# Patient Record
Sex: Female | Born: 1945
Health system: Southern US, Community
[De-identification: ages and names within clinical notes are randomized; demographics above are authoritative.]

## PROBLEM LIST (undated history)

## (undated) DIAGNOSIS — K635 Polyp of colon: Secondary | ICD-10-CM

## (undated) DIAGNOSIS — F329 Major depressive disorder, single episode, unspecified: Secondary | ICD-10-CM

## (undated) DIAGNOSIS — R569 Unspecified convulsions: Secondary | ICD-10-CM

## (undated) DIAGNOSIS — K5792 Diverticulitis of intestine, part unspecified, without perforation or abscess without bleeding: Secondary | ICD-10-CM

## (undated) DIAGNOSIS — K219 Gastro-esophageal reflux disease without esophagitis: Secondary | ICD-10-CM

## (undated) DIAGNOSIS — M810 Age-related osteoporosis without current pathological fracture: Secondary | ICD-10-CM

## (undated) DIAGNOSIS — I639 Cerebral infarction, unspecified: Secondary | ICD-10-CM

## (undated) DIAGNOSIS — F32A Depression, unspecified: Secondary | ICD-10-CM

## (undated) DIAGNOSIS — I10 Essential (primary) hypertension: Secondary | ICD-10-CM

## (undated) DIAGNOSIS — C50919 Malignant neoplasm of unspecified site of unspecified female breast: Secondary | ICD-10-CM

## (undated) HISTORY — DX: Major depressive disorder, single episode, unspecified: F32.9

## (undated) HISTORY — DX: Polyp of colon: K63.5

## (undated) HISTORY — DX: Diverticulitis of intestine, part unspecified, without perforation or abscess without bleeding: K57.92

## (undated) HISTORY — DX: Unspecified convulsions: R56.9

## (undated) HISTORY — PX: TONSILLECTOMY: SUR1361

## (undated) HISTORY — DX: Age-related osteoporosis without current pathological fracture: M81.0

## (undated) HISTORY — PX: TUBAL LIGATION: SHX77

## (undated) HISTORY — DX: Cerebral infarction, unspecified: I63.9

## (undated) HISTORY — DX: Malignant neoplasm of unspecified site of unspecified female breast: C50.919

## (undated) HISTORY — PX: MOUTH SURGERY: SHX715

## (undated) HISTORY — DX: Depression, unspecified: F32.A

---

## 1997-12-26 ENCOUNTER — Other Ambulatory Visit: Admission: RE | Admit: 1997-12-26 | Discharge: 1997-12-26 | Payer: Self-pay | Admitting: Obstetrics and Gynecology

## 1999-09-27 ENCOUNTER — Encounter: Admission: RE | Admit: 1999-09-27 | Discharge: 1999-09-27 | Payer: Self-pay | Admitting: Obstetrics and Gynecology

## 1999-09-27 ENCOUNTER — Encounter: Payer: Self-pay | Admitting: Obstetrics and Gynecology

## 2001-03-01 ENCOUNTER — Encounter: Admission: RE | Admit: 2001-03-01 | Discharge: 2001-03-01 | Payer: Self-pay | Admitting: Obstetrics and Gynecology

## 2001-03-01 ENCOUNTER — Encounter: Payer: Self-pay | Admitting: Obstetrics and Gynecology

## 2001-05-16 ENCOUNTER — Other Ambulatory Visit: Admission: RE | Admit: 2001-05-16 | Discharge: 2001-05-16 | Payer: Self-pay | Admitting: Obstetrics and Gynecology

## 2002-03-14 ENCOUNTER — Encounter: Admission: RE | Admit: 2002-03-14 | Discharge: 2002-03-14 | Payer: Self-pay | Admitting: Obstetrics and Gynecology

## 2002-03-14 ENCOUNTER — Encounter: Payer: Self-pay | Admitting: Obstetrics and Gynecology

## 2003-03-18 ENCOUNTER — Ambulatory Visit (HOSPITAL_COMMUNITY): Admission: RE | Admit: 2003-03-18 | Discharge: 2003-03-18 | Payer: Self-pay | Admitting: Obstetrics and Gynecology

## 2003-03-18 ENCOUNTER — Encounter: Payer: Self-pay | Admitting: Obstetrics and Gynecology

## 2009-02-03 LAB — HM COLONOSCOPY

## 2012-02-14 LAB — HM DEXA SCAN

## 2012-04-06 LAB — HM MAMMOGRAPHY: HM Mammogram: NORMAL

## 2012-07-24 ENCOUNTER — Other Ambulatory Visit: Payer: Self-pay | Admitting: Family Medicine

## 2012-07-24 ENCOUNTER — Ambulatory Visit
Admission: RE | Admit: 2012-07-24 | Discharge: 2012-07-24 | Disposition: A | Discharge status: Home / Self Care | Payer: BC Managed Care – PPO | Source: Ambulatory Visit | Attending: Family Medicine | Admitting: Family Medicine

## 2012-07-24 DIAGNOSIS — M545 Low back pain: Secondary | ICD-10-CM

## 2012-12-26 ENCOUNTER — Telehealth: Payer: Self-pay | Admitting: Family Medicine

## 2012-12-26 MED ORDER — EPINEPHRINE 0.3 MG/0.3ML IJ DEVI: 0.3000 mg | Freq: Once | INTRAMUSCULAR | Status: DC

## 2012-12-26 NOTE — Telephone Encounter (Signed)
Rx Refilled  

## 2013-01-08 ENCOUNTER — Encounter: Payer: Self-pay | Admitting: Family Medicine

## 2013-01-08 ENCOUNTER — Ambulatory Visit (INDEPENDENT_AMBULATORY_CARE_PROVIDER_SITE_OTHER): Payer: Medicare Other | Admitting: Family Medicine

## 2013-01-08 VITALS — BP 146/72 | HR 76 | Temp 97.9°F | Resp 14 | Wt 153.0 lb

## 2013-01-08 DIAGNOSIS — R300 Dysuria: Secondary | ICD-10-CM

## 2013-01-08 DIAGNOSIS — N390 Urinary tract infection, site not specified: Secondary | ICD-10-CM

## 2013-01-08 DIAGNOSIS — I10 Essential (primary) hypertension: Secondary | ICD-10-CM

## 2013-01-08 LAB — URINALYSIS, MICROSCOPIC ONLY: Casts: NONE SEEN

## 2013-01-08 LAB — URINALYSIS, ROUTINE W REFLEX MICROSCOPIC
Glucose, UA: NEGATIVE mg/dL
Ketones, ur: NEGATIVE mg/dL
Protein, ur: NEGATIVE mg/dL
pH: 7 (ref 5.0–8.0)

## 2013-01-08 MED ORDER — CIPROFLOXACIN HCL 500 MG PO TABS: 500.0000 mg | ORAL_TABLET | Freq: Two times a day (BID) | ORAL | Status: DC

## 2013-01-08 NOTE — Progress Notes (Signed)
  Subjective:    Patient ID: Cynthia [name], female    DOB: 1946/09/10, 67 y.o.   MRN: [id number]  HPI  Reports 3 days of dysuria, frequency, urgency, and hesitancy. Denies any fevers. Urinalysis is significant for leukocyte esterase, 1120 white blood cells per high power field and some trace hematuria.  She denies back pain, nausea, vomiting. No past medical history on file. Current Outpatient Prescriptions on File Prior to Visit  Medication Sig Dispense Refill  . EPINEPHrine (EPIPEN) 0.3 mg/0.3 mL DEVI Inject 0.3 mLs (0.3 mg total) into the muscle once. Pt has been getting Auto-Inj 2-Pak  1 Device  3   No current facility-administered medications on file prior to visit.   Allergies  Allergen Reactions  . Penicillins Rash   History   Social History  . Marital Status: Married    Spouse Name: N/A    Number of Children: N/A  . Years of Education: N/A   Occupational History  . Not on file.   Social History Main Topics  . Smoking status: Never Smoker   . Smokeless tobacco: Not on file  . Alcohol Use: No     Comment: 1-2 beers/wine in evening  . Drug Use: No  . Sexually Active: Not on file   Other Topics Concern  . Not on file   Social History Narrative  . No narrative on file     Review of Systems    remainder of review of systems is negative Objective:   Physical Exam  Cardiovascular: Normal rate, regular rhythm and normal heart sounds.   Pulmonary/Chest: Effort normal and breath sounds normal. No respiratory distress. She has no wheezes.  Abdominal: Soft. Bowel sounds are normal.          Assessment & Plan:  Dysuria - Plan: Urinalysis, Routine w reflex microscopic  HTN (hypertension)  UTI (urinary tract infection)  Cipro 500 mg by mouth twice a day for 5 days. Does have a lengthy discussion about her blood pressure. A recommended less than 2000 mg per day of sodium. Recommend she check her blood pressure every day. Her blood pressures consistently  running higher than 140/90, would recommend medication. Patient expressed understanding.

## 2013-04-23 ENCOUNTER — Encounter: Payer: Self-pay | Admitting: Family Medicine

## 2013-05-16 ENCOUNTER — Encounter: Payer: Self-pay | Admitting: Family Medicine

## 2013-05-16 ENCOUNTER — Ambulatory Visit (INDEPENDENT_AMBULATORY_CARE_PROVIDER_SITE_OTHER): Payer: Medicare Other | Admitting: Family Medicine

## 2013-05-16 VITALS — BP 136/78 | HR 106 | Temp 99.1°F | Resp 16 | Wt 151.0 lb

## 2013-05-16 DIAGNOSIS — J019 Acute sinusitis, unspecified: Secondary | ICD-10-CM

## 2013-05-16 DIAGNOSIS — R509 Fever, unspecified: Secondary | ICD-10-CM

## 2013-05-16 MED ORDER — AZITHROMYCIN 250 MG PO TABS: ORAL_TABLET | ORAL | Status: DC

## 2013-05-16 NOTE — Progress Notes (Signed)
Subjective:    Patient ID: Cynthia [name], female    DOB: 09/02/1946, 67 y.o.   MRN: [id number]  HPI  Patient is a very pleasant 67 year old white female. She's been having some sinus drainage over the last week. She has a sore scratchy throat and a nonproductive cough. However recently she has had a fever as high as 101.  She also has diffuse myalgias, headache, and neck stiffness. 2 days ago she donated platelets and had an IV for over 90 minutes.  The area around the is not red hot or shadowing. There is no evidence of a DVT in her arm. She feels that she most likely has a sinus infection. However she has never felt this poorly after a platelet donation.  No past medical history on file. Current Outpatient Prescriptions on File Prior to Visit  Medication Sig Dispense Refill  . EPINEPHrine (EPIPEN) 0.3 mg/0.3 mL DEVI Inject 0.3 mLs (0.3 mg total) into the muscle once. Pt has been getting Auto-Inj 2-Pak  1 Device  3  . alendronate (FOSAMAX) 70 MG tablet Take 70 mg by mouth every 7 (seven) days. Take with a full glass of water on an empty stomach.      . cetirizine (ZYRTEC) 10 MG tablet Take 10 mg by mouth daily.       No current facility-administered medications on file prior to visit.   Allergies  Allergen Reactions  . Penicillins Rash   History   Social History  . Marital Status: Married    Spouse Name: N/A    Number of Children: N/A  . Years of Education: N/A   Occupational History  . Not on file.   Social History Main Topics  . Smoking status: Never Smoker   . Smokeless tobacco: Not on file  . Alcohol Use: No     Comment: 1-2 beers/wine in evening  . Drug Use: No  . Sexual Activity: Not on file   Other Topics Concern  . Not on file   Social History Narrative  . No narrative on file     Review of Systems  All other systems reviewed and are negative.       Objective:   Physical Exam  Vitals reviewed. HENT:  Right Ear: External ear normal.  Left Ear:  External ear normal.  Nose: Nose normal.  Mouth/Throat: Oropharynx is clear and moist. No oropharyngeal exudate.  Neck: Neck supple. No thyromegaly present.  Cardiovascular: Normal rate, regular rhythm and normal heart sounds.  Exam reveals no gallop and no friction rub.   No murmur heard. Pulmonary/Chest: Effort normal and breath sounds normal. No respiratory distress. She has no wheezes. She has no rales. She exhibits no tenderness.  Abdominal: Soft. Bowel sounds are normal. She exhibits no distension. There is no tenderness. There is no rebound.  Musculoskeletal: She exhibits no edema and no tenderness.  Lymphadenopathy:    She has no cervical adenopathy.  Neurological: She is alert.  Skin: Skin is warm. No rash noted. No erythema. No pallor.          Assessment & Plan:  1. Acute sinusitis The patient most likely has acute sinusitis. Begin by taking a Z-Pak. She can take ibuprofen for headache and fever. Push fluids and rest. - azithromycin (ZITHROMAX) 250 MG tablet; 2 tabs poqday 1, 1 tab poqday 2-5  Dispense: 6 tablet; Refill: 0  2. Fever, unspecified Due to the recent invasive procedurewhere she had an IV for over 90 minutes, I would check blood  cultures x2 to rule out bacteremia. - Culture, blood (single) - Culture, blood (single)

## 2013-06-14 ENCOUNTER — Encounter: Payer: Self-pay | Admitting: Family Medicine

## 2013-06-14 ENCOUNTER — Other Ambulatory Visit: Payer: Medicare Other

## 2013-06-14 DIAGNOSIS — Z79899 Other long term (current) drug therapy: Secondary | ICD-10-CM

## 2013-06-14 DIAGNOSIS — K635 Polyp of colon: Secondary | ICD-10-CM | POA: Insufficient documentation

## 2013-06-14 DIAGNOSIS — Z0000 Encounter for general adult medical examination without abnormal findings: Secondary | ICD-10-CM

## 2013-06-14 DIAGNOSIS — M810 Age-related osteoporosis without current pathological fracture: Secondary | ICD-10-CM | POA: Insufficient documentation

## 2013-06-14 LAB — CBC WITH DIFFERENTIAL/PLATELET
Basophils Absolute: 0 10*3/uL (ref 0.0–0.1)
Basophils Relative: 0 % (ref 0–1)
Eosinophils Absolute: 0.1 10*3/uL (ref 0.0–0.7)
Eosinophils Relative: 1 % (ref 0–5)
HCT: 34.4 % — ABNORMAL LOW (ref 36.0–46.0)
Hemoglobin: 11.9 g/dL — ABNORMAL LOW (ref 12.0–15.0)
Lymphocytes Relative: 37 % (ref 12–46)
Lymphs Abs: 1.7 10*3/uL (ref 0.7–4.0)
MCH: 30.4 pg (ref 26.0–34.0)
MCHC: 34.6 g/dL (ref 30.0–36.0)
MCV: 87.8 fL (ref 78.0–100.0)
Monocytes Absolute: 0.4 10*3/uL (ref 0.1–1.0)
Monocytes Relative: 9 % (ref 3–12)
Neutro Abs: 2.4 10*3/uL (ref 1.7–7.7)
Neutrophils Relative %: 53 % (ref 43–77)
Platelets: 233 10*3/uL (ref 150–400)
RBC: 3.92 MIL/uL (ref 3.87–5.11)
RDW: 14 % (ref 11.5–15.5)
WBC: 4.7 10*3/uL (ref 4.0–10.5)

## 2013-06-14 LAB — COMPLETE METABOLIC PANEL WITH GFR
ALT: 15 U/L (ref 0–35)
AST: 23 U/L (ref 0–37)
Albumin: 3.9 g/dL (ref 3.5–5.2)
Alkaline Phosphatase: 58 U/L (ref 39–117)
BUN: 19 mg/dL (ref 6–23)
CO2: 27 mEq/L (ref 19–32)
Calcium: 8.8 mg/dL (ref 8.4–10.5)
Chloride: 107 mEq/L (ref 96–112)
Creat: 0.81 mg/dL (ref 0.50–1.10)
GFR, Est African American: 88 mL/min
GFR, Est Non African American: 76 mL/min
Glucose, Bld: 88 mg/dL (ref 70–99)
Potassium: 5 mEq/L (ref 3.5–5.3)
Sodium: 142 mEq/L (ref 135–145)
Total Bilirubin: 0.7 mg/dL (ref 0.3–1.2)
Total Protein: 5.8 g/dL — ABNORMAL LOW (ref 6.0–8.3)

## 2013-06-14 LAB — LIPID PANEL
Cholesterol: 188 mg/dL (ref 0–200)
HDL: 86 mg/dL (ref >39)
LDL Cholesterol: 90 mg/dL (ref 0–99)
Total CHOL/HDL Ratio: 2.2 Ratio
Triglycerides: 58 mg/dL (ref <150)
VLDL: 12 mg/dL (ref 0–40)

## 2013-06-14 LAB — TSH: TSH: 3.81 u[IU]/mL (ref 0.350–4.500)

## 2013-06-15 LAB — VITAMIN D 25 HYDROXY (VIT D DEFICIENCY, FRACTURES): Vit D, 25-Hydroxy: 48 ng/mL (ref 30–89)

## 2013-06-18 ENCOUNTER — Ambulatory Visit (INDEPENDENT_AMBULATORY_CARE_PROVIDER_SITE_OTHER): Payer: Medicare Other | Admitting: Family Medicine

## 2013-06-18 ENCOUNTER — Encounter: Payer: Self-pay | Admitting: Family Medicine

## 2013-06-18 VITALS — BP 140/70 | HR 80 | Temp 98.2°F | Resp 14 | Ht 66.5 in | Wt 153.0 lb

## 2013-06-18 DIAGNOSIS — Z0000 Encounter for general adult medical examination without abnormal findings: Secondary | ICD-10-CM

## 2013-06-18 DIAGNOSIS — Z23 Encounter for immunization: Secondary | ICD-10-CM

## 2013-06-18 DIAGNOSIS — J449 Chronic obstructive pulmonary disease, unspecified: Secondary | ICD-10-CM

## 2013-06-18 MED ORDER — HYDROCODONE-HOMATROPINE 5-1.5 MG/5ML PO SYRP: 5.0000 mL | ORAL_SOLUTION | Freq: Three times a day (TID) | ORAL | Status: DC | PRN

## 2013-06-18 NOTE — Progress Notes (Signed)
Subjective:    Patient ID: Cynthia [name], female    DOB: 12-27-1945, 67 y.o.   MRN: [id number]  HPI Patient is a very pleasant 67 year old white female who comes in today for general medical evaluation. She has no major concerns. She recently had bronchitis and still has a nagging cough. She is requesting a refill on Hycodan cough syrup which I'm glad to give her. Otherwise she is doing well. Her last colonoscopy was in 2010 pHs down after: Polyps and is due again next year. Her last bone density was in 2013. The patient has been on Fosamax for over 5 years. If we discontinue the medication this summer. She is next due for bone density in 2016. Her mammogram is up-to-date was performed this year. It was normal. She has had her flu shot. She has had her single shot. She's had a pneumonia shot today. She is overdue for a Pap smear. She has a remote history of an abnormal Pap smear almost 20 years ago. He has not had an abnormal Pap smear since. She denies any new sexual partners. Otherwise she is doing very well and her most recent labwork as listed below: Abstract on 06/14/2013  Component Date Value Range Status  . HM Mammogram 04/06/2012 normal  [facility]   Final  . HM Pap smear 02/16/2010 normal   Final  . HM Colonoscopy 02/03/2009 Dr Buccini   polyps   Final  . HM Dexa Scan 02/14/2012 osteoporosis   [facility]   Final  Lab on 06/14/2013  Component Date Value Range Status  . WBC 06/14/2013 4.7  4.0 - 10.5 K/uL Final  . RBC 06/14/2013 3.92  3.87 - 5.11 MIL/uL Final  . Hemoglobin 06/14/2013 11.9* 12.0 - 15.0 g/dL Final  . HCT 40/98/1191 34.4* 36.0 - 46.0 % Final  . MCV 06/14/2013 87.8  78.0 - 100.0 fL Final  . MCH 06/14/2013 30.4  26.0 - 34.0 pg Final  . MCHC 06/14/2013 34.6  30.0 - 36.0 g/dL Final  . RDW 47/82/9562 14.0  11.5 - 15.5 % Final  . Platelets 06/14/2013 233  150 - 400 K/uL Final  . Neutrophils Relative % 06/14/2013 53  43 - 77 % Final  . Neutro Abs 06/14/2013 2.4  1.7 - 7.7 K/uL Final   . Lymphocytes Relative 06/14/2013 37  12 - 46 % Final  . Lymphs Abs 06/14/2013 1.7  0.7 - 4.0 K/uL Final  . Monocytes Relative 06/14/2013 9  3 - 12 % Final  . Monocytes Absolute 06/14/2013 0.4  0.1 - 1.0 K/uL Final  . Eosinophils Relative 06/14/2013 1  0 - 5 % Final  . Eosinophils Absolute 06/14/2013 0.1  0.0 - 0.7 K/uL Final  . Basophils Relative 06/14/2013 0  0 - 1 % Final  . Basophils Absolute 06/14/2013 0.0  0.0 - 0.1 K/uL Final  . Smear Review 06/14/2013 Criteria for review not met   Final  . Cholesterol 06/14/2013 188  0 - 200 mg/dL Final   Comment: ATP III Classification:                                < 200        mg/dL        Desirable                               200 - 239  mg/dL        Borderline High                               >= 240        mg/dL        High                             . Triglycerides 06/14/2013 58  <150 mg/dL Final  . HDL 16/06/9603 86  >39 mg/dL Final  . Total CHOL/HDL Ratio 06/14/2013 2.2   Final  . VLDL 06/14/2013 12  0 - 40 mg/dL Final  . LDL Cholesterol 06/14/2013 90  0 - 99 mg/dL Final   Comment:                            Total Cholesterol/HDL Ratio:CHD Risk                                                 Coronary Heart Disease Risk Table                                                                 Men       Women                                   1/2 Average Risk              3.4        3.3                                       Average Risk              5.0        4.4                                    2X Average Risk              9.6        7.1                                    3X Average Risk             23.4       11.0                          Use the calculated Patient Ratio above and the CHD Risk table                           to determine the patient's  CHD Risk.                          ATP III Classification (LDL):                                < 100        mg/dL         Optimal                               100 - 129      mg/dL         Near or Above Optimal                               130 - 159     mg/dL         Borderline High                               160 - 189     mg/dL         High                                > 190        mg/dL         Very High                             . TSH 06/14/2013 3.810  0.350 - 4.500 uIU/mL Final  . Vit D, 25-Hydroxy 06/14/2013 48  30 - 89 ng/mL Final   Comment: This assay accurately quantifies Vitamin D, which is the sum of the                          25-Hydroxy forms of Vitamin D2 and D3.  Studies have shown that the                          optimum concentration of 25-Hydroxy Vitamin D is 30 ng/mL or higher.                           Concentrations of Vitamin D between 20 and 29 ng/mL are considered to                          be insufficient and concentrations less than 20 ng/mL are considered                          to be deficient for Vitamin D.  . Sodium 06/14/2013 142  135 - 145 mEq/L Final  . Potassium 06/14/2013 5.0  3.5 - 5.3 mEq/L Final  . Chloride 06/14/2013 107  96 - 112 mEq/L Final  . CO2 06/14/2013 27  19 - 32 mEq/L Final  . Glucose, Bld 06/14/2013 88  70 - 99 mg/dL Final  . BUN 29/56/2130 19  6 - 23 mg/dL Final  . Creat 86/57/8469 0.81  0.50 - 1.10 mg/dL Final  .  Total Bilirubin 06/14/2013 0.7  0.3 - 1.2 mg/dL Final  . Alkaline Phosphatase 06/14/2013 58  39 - 117 U/L Final  . AST 06/14/2013 23  0 - 37 U/L Final  . ALT 06/14/2013 15  0 - 35 U/L Final  . Total Protein 06/14/2013 5.8* 6.0 - 8.3 g/dL Final  . Albumin 16/06/9603 3.9  3.5 - 5.2 g/dL Final  . Calcium 54/05/8118 8.8  8.4 - 10.5 mg/dL Final  . GFR, Est African American 06/14/2013 88   Final  . GFR, Est Non African American 06/14/2013 76   Final   Comment:                            The estimated GFR is a calculation valid for adults (>=31 years old)                          that uses the CKD-EPI algorithm to adjust for age and sex. It is                            not to be used for  children, pregnant women, hospitalized patients,                             patients on dialysis, or with rapidly changing kidney function.                          According to the NKDEP, eGFR >89 is normal, 60-89 shows mild                          impairment, 30-59 shows moderate impairment, 15-29 shows severe                          impairment and <15 is ESRD.                              Past Medical History  Diagnosis Date  . Colon polyps   . Osteoporosis   . Depression     history of   History reviewed. No pertinent past surgical history. Current Outpatient Prescriptions on File Prior to Visit  Medication Sig Dispense Refill  . cetirizine (ZYRTEC) 10 MG tablet Take 10 mg by mouth daily.      Marland Kitchen EPINEPHrine (EPIPEN) 0.3 mg/0.3 mL DEVI Inject 0.3 mLs (0.3 mg total) into the muscle once. Pt has been getting Auto-Inj 2-Pak  1 Device  3   No current facility-administered medications on file prior to visit.   Allergies  Allergen Reactions  . Other Anaphylaxis    FIRE ANTS  . Penicillins Rash   History   Social History  . Marital Status: Married    Spouse Name: N/A    Number of Children: N/A  . Years of Education: N/A   Occupational History  . Not on file.   Social History Main Topics  . Smoking status: Never Smoker   . Smokeless tobacco: Never Used  . Alcohol Use: No     Comment: 1-2 beers/wine in evening  . Drug Use: No  . Sexual Activity: Not on file     Comment: Married since 2005  Other Topics Concern  . Not on file   Social History Narrative  . No narrative on file   Family History  Problem Relation Age of Onset  . Heart disease Mother   . Cancer Mother     lung      Review of Systems  All other systems reviewed and are negative.       Objective:   Physical Exam  Vitals reviewed. Constitutional: She is oriented to person, place, and time. She appears well-developed and well-nourished. No distress.  HENT:  Head: Normocephalic and  atraumatic.  Right Ear: External ear normal.  Left Ear: External ear normal.  Nose: Nose normal.  Mouth/Throat: Oropharynx is clear and moist. No oropharyngeal exudate.  Eyes: Conjunctivae and EOM are normal. Pupils are equal, round, and reactive to light. Right eye exhibits no discharge. Left eye exhibits no discharge. No scleral icterus.  Neck: Normal range of motion. Neck supple. No JVD present. No thyromegaly present.  Cardiovascular: Normal rate, regular rhythm, normal heart sounds and intact distal pulses.  Exam reveals no gallop and no friction rub.   No murmur heard. Pulmonary/Chest: Effort normal and breath sounds normal. No respiratory distress. She has no wheezes. She has no rales. She exhibits no tenderness.  Abdominal: Soft. Bowel sounds are normal. She exhibits no distension. There is no tenderness. There is no rebound and no guarding.  Musculoskeletal: Normal range of motion. She exhibits no edema and no tenderness.  Lymphadenopathy:    She has no cervical adenopathy.  Neurological: She is alert and oriented to person, place, and time. She has normal reflexes. She displays normal reflexes. No cranial nerve deficit. She exhibits normal muscle tone. Coordination normal.  Skin: Skin is warm. No rash noted. She is not diaphoretic. No erythema. No pallor.  Psychiatric: She has a normal mood and affect. Her behavior is normal. Judgment and thought content normal.          Assessment & Plan:  1. Obstructive chronic bronchitis without exacerbation - HYDROcodone-homatropine (HYCODAN) 5-1.5 MG/5ML syrup; Take 5 mLs by mouth every 8 (eight) hours as needed for cough.  Dispense: 120 mL; Refill: 0  2. Routine general medical examination at a health care facility Physical exam is completely normal. Lab work is excellent. Preventive care is up to date. I recommended a bone density test in 2 years. I recommended a colonoscopy and a mammogram next year. I will give the patient her pneumonia  vaccine today. Otherwise followup in one year or as needed.

## 2013-06-18 NOTE — Addendum Note (Signed)
Addended by: Legrand Rams B on: 06/18/2013 04:52 PM   Modules accepted: Orders

## 2013-12-18 ENCOUNTER — Other Ambulatory Visit: Payer: Self-pay | Admitting: Family Medicine

## 2014-05-06 ENCOUNTER — Telehealth: Payer: Self-pay | Admitting: Family Medicine

## 2014-05-06 NOTE — Telephone Encounter (Signed)
Patient wants referral for 3 d mammogram and would like to talk to you about this, she has an authorization number and pin to give you? I was a little confused please call her back at [phone]

## 2014-05-07 NOTE — Telephone Encounter (Signed)
Pt wants to know if you think she should have the 3D mammo, says it is authorized by her insurance co if ok, has appt tomorrow(05/08/14) at 930am.for normal mammogram. Says it is ok if not

## 2014-05-08 NOTE — Telephone Encounter (Signed)
I am fine either way, but regular mammograms are currently the standard recommendation.  #D would be more accurate but insurance may not fully cover.

## 2014-05-13 LAB — HM MAMMOGRAPHY: HM Mammogram: NORMAL

## 2014-05-13 LAB — HM DEXA SCAN: HM Dexa Scan: NORMAL

## 2014-06-17 ENCOUNTER — Encounter: Payer: Self-pay | Admitting: Family Medicine

## 2014-06-17 ENCOUNTER — Ambulatory Visit (INDEPENDENT_AMBULATORY_CARE_PROVIDER_SITE_OTHER): Payer: Medicare HMO | Admitting: Family Medicine

## 2014-06-17 VITALS — BP 130/74 | HR 72 | Temp 98.0°F | Resp 14 | Ht 65.0 in | Wt 150.0 lb

## 2014-06-17 DIAGNOSIS — Z23 Encounter for immunization: Secondary | ICD-10-CM

## 2014-06-17 DIAGNOSIS — Z124 Encounter for screening for malignant neoplasm of cervix: Secondary | ICD-10-CM

## 2014-06-17 DIAGNOSIS — Z1321 Encounter for screening for nutritional disorder: Secondary | ICD-10-CM

## 2014-06-17 DIAGNOSIS — M810 Age-related osteoporosis without current pathological fracture: Secondary | ICD-10-CM

## 2014-06-17 DIAGNOSIS — Z0000 Encounter for general adult medical examination without abnormal findings: Secondary | ICD-10-CM

## 2014-06-17 DIAGNOSIS — I10 Essential (primary) hypertension: Secondary | ICD-10-CM

## 2014-06-17 NOTE — Assessment & Plan Note (Signed)
Lifestyle controlled.

## 2014-06-17 NOTE — Progress Notes (Signed)
Patient ID: Cynthia [name], female   DOB: 02-09-1946, 68 y.o.   MRN: [id number] Subjective:   Patient presents for Medicare Annual/Subsequent preventive examination.  Patient here for annual exam and Pap smear. Her last Pap smear was about 3 years ago. She does have history of it abnormal Pap smear the 90s were checked for possibly done but this was benign. She's not had any vaginal bleeding since she went to menopause was at about 10 years ago. She's no specific concerns today. Review Past Medical/Family/Social: Per EMR   Risk Factors  Current exercise habits: walks Dietary issues discussed: None  Cardiac risk factors: diet controlled HTN  Depression Screen  (Note: if answer to either of the following is "Yes", a more complete depression screening is indicated)  Over the past two weeks, have you felt down, depressed or hopeless? No Over the past two weeks, have you felt little interest or pleasure in doing things? No Have you lost interest or pleasure in daily life? No Do you often feel hopeless? No Do you cry easily over simple problems? No   Activities of Daily Living  In your present state of health, do you have any difficulty performing the following activities?:  Driving? No  Managing money? No  Feeding yourself? No  Getting from bed to chair? No  Climbing a flight of stairs? No  Preparing food and eating?: No  Bathing or showering? No  Getting dressed: No  Getting to the toilet? No  Using the toilet:No  Moving around from place to place: No  In the past year have you fallen or had a near fall?:No  Are you sexually active? Yes  Do you have more than one partner? No   Hearing Difficulties: No  Do you often ask people to speak up or repeat themselves? No  Do you experience ringing or noises in your ears? No Do you have difficulty understanding soft or whispered voices? No  Do you feel that you have a problem with memory? No Do you often misplace items? No  Do you feel  safe at home? Yes  Cognitive Testing  Alert? Yes Normal Appearance?Yes  Oriented to person? Yes Place? Yes  Time? Yes  Recall of three objects? Yes  Can perform simple calculations? Yes  Displays appropriate judgment?Yes  Can read the correct time from a watch face?Yes   List the Names of Other Physician/Practitioners you currently use: Dr. Cristina Gong [facility]    Screening Tests / Date Colonoscopy      - 2015            Zostavax - UTD Mammogram - UTD Influenza Vaccine - Given Tetanus/tdap- UTD  ROS: GEN- denies fatigue, fever, weight loss,weakness, recent illness HEENT- denies eye drainage, change in vision, nasal discharge, CVS- denies chest pain, palpitations RESP- denies SOB, cough, wheeze ABD- denies N/V, change in stools, abd pain GU- denies dysuria, hematuria, dribbling, incontinence MSK- denies joint pain, muscle aches, injury Neuro- denies headache, dizziness, syncope, seizure activity  Physical: GEN- NAD, alert and oriented x3 HEENT- PERRL, EOMI, non injected sclera, pink conjunctiva, MMM, oropharynx clear Neck- Supple, no thryomegaly  CVS- RRR, no murmur RESP-CTAB Breast- normal symmetry, no nipple inversion,no nipple drainage, no nodules or lumps felt Nodes- no axillary nodes ABD-NABS,soft,NT,ND GU- normal external genitalia, vaginal mucosa pink and moist, cervix visualized no growth, no blood form os, nO discharge, no CMT, no ovarian masses, uterus normal size, bladder normal position Rectum- skin tag noted, normal rectal tone, FOBT neg EXT- No  edema Pulses- Radial, DP- 2+    Assessment:    Annual wellness medicare exam   Plan:    During the course of the visit the patient was educated and counseled about appropriate screening and preventive services including:   Flu shot and Prevnar 13 given Screen neg for depression.    Diet review for nutrition referral? Yes ____ Not Indicated __x__  Patient Instructions (the written plan) was given to the patient.   Medicare Attestation  I have personally reviewed:  The patient's medical and social history  Their use of alcohol, tobacco or illicit drugs  Their current medications and supplements  The patient's functional ability including ADLs,fall risks, home safety risks, cognitive, and hearing and visual impairment  Diet and physical activities  Evidence for depression or mood disorders  The patient's weight, height, BMI, and visual acuity have been recorded in the chart. I have made referrals, counseling, and provided education to the patient based on review of the above and I have provided the patient with a written personalized care plan for preventive services.

## 2014-06-17 NOTE — Assessment & Plan Note (Signed)
Bone Density to be done 

## 2014-06-17 NOTE — Patient Instructions (Signed)
Return for fasting labs Bone Density  F/U as needed

## 2014-06-18 LAB — PAP THINPREP ASCUS RFLX HPV RFLX TYPE

## 2014-06-18 MED ORDER — MELOXICAM 15 MG PO TABS: ORAL_TABLET | ORAL | Status: DC

## 2014-06-18 MED ORDER — EPINEPHRINE 0.3 MG/0.3ML IJ SOAJ: 0.3000 mg | Freq: Once | INTRAMUSCULAR | Status: DC

## 2014-06-18 NOTE — Addendum Note (Signed)
Addended by: Vic Blackbird F on: 06/18/2014 07:42 AM   Modules accepted: Orders, Medications

## 2014-06-20 ENCOUNTER — Encounter: Payer: Self-pay | Admitting: *Deleted

## 2014-06-27 ENCOUNTER — Other Ambulatory Visit: Payer: Medicare HMO

## 2014-06-27 DIAGNOSIS — I10 Essential (primary) hypertension: Secondary | ICD-10-CM

## 2014-06-27 DIAGNOSIS — Z1321 Encounter for screening for nutritional disorder: Secondary | ICD-10-CM

## 2014-06-27 LAB — COMPREHENSIVE METABOLIC PANEL
ALK PHOS: 64 U/L (ref 39–117)
ALT: 13 U/L (ref 0–35)
AST: 20 U/L (ref 0–37)
Albumin: 3.9 g/dL (ref 3.5–5.2)
BUN: 14 mg/dL (ref 6–23)
CO2: 27 mEq/L (ref 19–32)
Calcium: 9.2 mg/dL (ref 8.4–10.5)
Chloride: 107 mEq/L (ref 96–112)
Creat: 0.73 mg/dL (ref 0.50–1.10)
Glucose, Bld: 89 mg/dL (ref 70–99)
Potassium: 4.8 mEq/L (ref 3.5–5.3)
Sodium: 142 mEq/L (ref 135–145)
Total Bilirubin: 0.7 mg/dL (ref 0.2–1.2)
Total Protein: 5.8 g/dL — ABNORMAL LOW (ref 6.0–8.3)

## 2014-06-27 LAB — CBC WITH DIFFERENTIAL/PLATELET
BASOS ABS: 0 10*3/uL (ref 0.0–0.1)
BASOS PCT: 0 % (ref 0–1)
EOS PCT: 1 % (ref 0–5)
Eosinophils Absolute: 0.1 10*3/uL (ref 0.0–0.7)
HEMATOCRIT: 35.4 % — AB (ref 36.0–46.0)
Hemoglobin: 11.9 g/dL — ABNORMAL LOW (ref 12.0–15.0)
Lymphocytes Relative: 28 % (ref 12–46)
Lymphs Abs: 1.7 10*3/uL (ref 0.7–4.0)
MCH: 29.7 pg (ref 26.0–34.0)
MCHC: 33.6 g/dL (ref 30.0–36.0)
MCV: 88.3 fL (ref 78.0–100.0)
MONO ABS: 0.5 10*3/uL (ref 0.1–1.0)
Monocytes Relative: 8 % (ref 3–12)
Neutro Abs: 3.9 10*3/uL (ref 1.7–7.7)
Neutrophils Relative %: 63 % (ref 43–77)
Platelets: 258 10*3/uL (ref 150–400)
RBC: 4.01 MIL/uL (ref 3.87–5.11)
RDW: 13.6 % (ref 11.5–15.5)
WBC: 6.2 10*3/uL (ref 4.0–10.5)

## 2014-06-27 LAB — LIPID PANEL
CHOL/HDL RATIO: 2.1 ratio
Cholesterol: 181 mg/dL (ref 0–200)
HDL: 85 mg/dL (ref >39)
LDL Cholesterol: 85 mg/dL (ref 0–99)
TRIGLYCERIDES: 54 mg/dL (ref <150)
VLDL: 11 mg/dL (ref 0–40)

## 2014-06-28 LAB — VITAMIN D 25 HYDROXY (VIT D DEFICIENCY, FRACTURES): VIT D 25 HYDROXY: 46 ng/mL (ref 30–89)

## 2014-07-22 ENCOUNTER — Encounter: Payer: Self-pay | Admitting: *Deleted

## 2014-07-29 ENCOUNTER — Telehealth: Payer: Self-pay | Admitting: *Deleted

## 2014-07-29 NOTE — Telephone Encounter (Signed)
Pt has appointment at [facility] for Mammogram and Bone Density on Dec 7, Bone Density is at 3:00pm and follows with Mammogram at 3:30pm, pt is not to wear metal buttons or take Ca supplements day of her scans. LMTRC to pt.

## 2014-07-30 ENCOUNTER — Encounter: Payer: Self-pay | Admitting: Family Medicine

## 2014-07-30 NOTE — Telephone Encounter (Signed)
lmtrc

## 2014-07-31 ENCOUNTER — Encounter: Payer: Self-pay | Admitting: Family Medicine

## 2014-07-31 NOTE — Telephone Encounter (Signed)
Pt states already had Mammo and DEXA done this year, her Mammo was done in Sept 2015 but couldn't remember when she had her Done Density.

## 2014-10-13 ENCOUNTER — Other Ambulatory Visit: Payer: Self-pay | Admitting: Family Medicine

## 2014-10-14 NOTE — Telephone Encounter (Signed)
Medication refilled per protocol. 

## 2015-02-12 ENCOUNTER — Telehealth: Payer: Self-pay | Admitting: Family Medicine

## 2015-02-12 NOTE — Telephone Encounter (Signed)
Patient states has wasp bite and want to know if she should use her epinephrine - been several years since allergic reaction to wasp [phone]

## 2015-02-13 NOTE — Telephone Encounter (Signed)
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

## 2015-06-10 DIAGNOSIS — Z1231 Encounter for screening mammogram for malignant neoplasm of breast: Secondary | ICD-10-CM | POA: Diagnosis not present

## 2015-06-10 LAB — HM MAMMOGRAPHY: HM MAMMO: NEGATIVE

## 2015-06-18 ENCOUNTER — Encounter: Payer: Self-pay | Admitting: Family Medicine

## 2015-06-19 ENCOUNTER — Other Ambulatory Visit: Payer: Medicare HMO

## 2015-06-19 DIAGNOSIS — M810 Age-related osteoporosis without current pathological fracture: Secondary | ICD-10-CM | POA: Diagnosis not present

## 2015-06-19 DIAGNOSIS — Z0000 Encounter for general adult medical examination without abnormal findings: Secondary | ICD-10-CM | POA: Diagnosis not present

## 2015-06-19 DIAGNOSIS — Z79899 Other long term (current) drug therapy: Secondary | ICD-10-CM | POA: Diagnosis not present

## 2015-06-19 DIAGNOSIS — I10 Essential (primary) hypertension: Secondary | ICD-10-CM

## 2015-06-19 LAB — COMPLETE METABOLIC PANEL WITH GFR
ALT: 15 U/L (ref 6–29)
AST: 22 U/L (ref 10–35)
Albumin: 3.8 g/dL (ref 3.6–5.1)
Alkaline Phosphatase: 70 U/L (ref 33–130)
BILIRUBIN TOTAL: 0.6 mg/dL (ref 0.2–1.2)
BUN: 13 mg/dL (ref 7–25)
CHLORIDE: 106 mmol/L (ref 98–110)
CO2: 28 mmol/L (ref 20–31)
CREATININE: 0.69 mg/dL (ref 0.50–0.99)
Calcium: 8.5 mg/dL — ABNORMAL LOW (ref 8.6–10.4)
GFR, Est Non African American: >89 mL/min (ref >=60)
GLUCOSE: 87 mg/dL (ref 70–99)
Potassium: 4.4 mmol/L (ref 3.5–5.3)
Sodium: 144 mmol/L (ref 135–146)
Total Protein: 5.6 g/dL — ABNORMAL LOW (ref 6.1–8.1)

## 2015-06-19 LAB — CBC WITH DIFFERENTIAL/PLATELET
BASOS PCT: 0 % (ref 0–1)
Basophils Absolute: 0 10*3/uL (ref 0.0–0.1)
EOS ABS: 0.1 10*3/uL (ref 0.0–0.7)
EOS PCT: 2 % (ref 0–5)
HCT: 35.5 % — ABNORMAL LOW (ref 36.0–46.0)
Hemoglobin: 12 g/dL (ref 12.0–15.0)
LYMPHS ABS: 1.6 10*3/uL (ref 0.7–4.0)
Lymphocytes Relative: 31 % (ref 12–46)
MCH: 30.1 pg (ref 26.0–34.0)
MCHC: 33.8 g/dL (ref 30.0–36.0)
MCV: 89 fL (ref 78.0–100.0)
MONOS PCT: 10 % (ref 3–12)
MPV: 9.1 fL (ref 8.6–12.4)
Monocytes Absolute: 0.5 10*3/uL (ref 0.1–1.0)
Neutro Abs: 3 10*3/uL (ref 1.7–7.7)
Neutrophils Relative %: 57 % (ref 43–77)
Platelets: 310 10*3/uL (ref 150–400)
RBC: 3.99 MIL/uL (ref 3.87–5.11)
RDW: 13.6 % (ref 11.5–15.5)
WBC: 5.3 10*3/uL (ref 4.0–10.5)

## 2015-06-19 LAB — LIPID PANEL
CHOL/HDL RATIO: 1.7 ratio (ref <=5.0)
CHOLESTEROL: 173 mg/dL (ref 125–200)
HDL: 100 mg/dL (ref >=46)
LDL CALC: 65 mg/dL (ref <130)
TRIGLYCERIDES: 38 mg/dL (ref <150)
VLDL: 8 mg/dL (ref <30)

## 2015-06-20 LAB — TSH: TSH: 4.294 u[IU]/mL (ref 0.350–4.500)

## 2015-06-20 LAB — VITAMIN D 25 HYDROXY (VIT D DEFICIENCY, FRACTURES): Vit D, 25-Hydroxy: 35 ng/mL (ref 30–100)

## 2015-06-29 ENCOUNTER — Encounter: Payer: Self-pay | Admitting: Family Medicine

## 2015-06-29 ENCOUNTER — Other Ambulatory Visit: Payer: Self-pay | Admitting: Family Medicine

## 2015-06-29 ENCOUNTER — Ambulatory Visit (INDEPENDENT_AMBULATORY_CARE_PROVIDER_SITE_OTHER): Payer: Medicare HMO | Admitting: Family Medicine

## 2015-06-29 VITALS — BP 120/68 | HR 100 | Temp 99.0°F | Resp 16 | Ht 66.5 in | Wt 147.0 lb

## 2015-06-29 DIAGNOSIS — Z0000 Encounter for general adult medical examination without abnormal findings: Secondary | ICD-10-CM

## 2015-06-29 MED ORDER — ALPRAZOLAM 0.25 MG PO TABS: 0.2500 mg | ORAL_TABLET | Freq: Three times a day (TID) | ORAL | Status: DC | PRN

## 2015-06-29 NOTE — Progress Notes (Signed)
Subjective:    Patient ID: Cynthia [name], female    DOB: 12-02-1945, 69 y.o.   MRN: [id number]  HPI  patient is here today for a physical. Unfortunately her husband has started drinking heavily. He is an alcoholic. He will not admit that he has a problem. Her first husband died from cirrhosis due to alcohol. This is caused the patient a tremendous amount of stress and anxiety. She is having a difficult time sleeping at night. She denies clinical depression. She denies anhedonia. She denies suicidal ideation. However she  Finds herself lying in bed at night unable to sleep. She also reports occasional anxiety attacks which she will feel extremely anxious and tremulous. In the past she has used Xanax sparingly for anxiety.  She would like to try this again. Colonoscopy was performed last year and is not due again until 2020. Mammogram was just performed in September and was normal. Immunizations are up-to-date. She is already received her flu shot prior to my visit with her today. Her bone density was performed last year and is not due again until 2017. Her most recent lab work as listed below: Lab on 06/19/2015  Component Date Value Ref Range Status  . Sodium 06/19/2015 144  135 - 146 mmol/L Final  . Potassium 06/19/2015 4.4  3.5 - 5.3 mmol/L Final  . Chloride 06/19/2015 106  98 - 110 mmol/L Final  . CO2 06/19/2015 28  20 - 31 mmol/L Final  . Glucose, Bld 06/19/2015 87  70 - 99 mg/dL Final  . BUN 06/19/2015 13  7 - 25 mg/dL Final  . Creat 06/19/2015 0.69  0.50 - 0.99 mg/dL Final  . Total Bilirubin 06/19/2015 0.6  0.2 - 1.2 mg/dL Final  . Alkaline Phosphatase 06/19/2015 70  33 - 130 U/L Final  . AST 06/19/2015 22  10 - 35 U/L Final  . ALT 06/19/2015 15  6 - 29 U/L Final  . Total Protein 06/19/2015 5.6* 6.1 - 8.1 g/dL Final  . Albumin 06/19/2015 3.8  3.6 - 5.1 g/dL Final  . Calcium 06/19/2015 8.5* 8.6 - 10.4 mg/dL Final  . GFR, Est African American 06/19/2015 >89  >=60 mL/min Final  . GFR,  Est Non African American 06/19/2015 >89  >=60 mL/min Final   Comment:   The estimated GFR is a calculation valid for adults (>=55 years old) that uses the CKD-EPI algorithm to adjust for age and sex. It is   not to be used for children, pregnant women, hospitalized patients,    patients on dialysis, or with rapidly changing kidney function. According to the NKDEP, eGFR >89 is normal, 60-89 shows mild impairment, 30-59 shows moderate impairment, 15-29 shows severe impairment and <15 is ESRD.     Marland Kitchen TSH 06/19/2015 4.294  0.350 - 4.500 uIU/mL Final  . Cholesterol 06/19/2015 173  125 - 200 mg/dL Final  . Triglycerides 06/19/2015 38  <150 mg/dL Final  . HDL 06/19/2015 100  >=46 mg/dL Final  . Total CHOL/HDL Ratio 06/19/2015 1.7  <=5.0 Ratio Final  . VLDL 06/19/2015 8  <30 mg/dL Final  . LDL Cholesterol 06/19/2015 65  <130 mg/dL Final   Comment:   Total Cholesterol/HDL Ratio:CHD Risk                        Coronary Heart Disease Risk Table  Men       Women          1/2 Average Risk              3.4        3.3              Average Risk              5.0        4.4           2X Average Risk              9.6        7.1           3X Average Risk             23.4       11.0 Use the calculated Patient Ratio above and the CHD Risk table  to determine the patient's CHD Risk.   . WBC 06/19/2015 5.3  4.0 - 10.5 K/uL Final  . RBC 06/19/2015 3.99  3.87 - 5.11 MIL/uL Final  . Hemoglobin 06/19/2015 12.0  12.0 - 15.0 g/dL Final  . HCT 06/19/2015 35.5* 36.0 - 46.0 % Final  . MCV 06/19/2015 89.0  78.0 - 100.0 fL Final  . MCH 06/19/2015 30.1  26.0 - 34.0 pg Final  . MCHC 06/19/2015 33.8  30.0 - 36.0 g/dL Final  . RDW 06/19/2015 13.6  11.5 - 15.5 % Final  . Platelets 06/19/2015 310  150 - 400 K/uL Final  . MPV 06/19/2015 9.1  8.6 - 12.4 fL Final  . Neutrophils Relative % 06/19/2015 57  43 - 77 % Final  . Neutro Abs 06/19/2015 3.0  1.7 - 7.7 K/uL Final  .  Lymphocytes Relative 06/19/2015 31  12 - 46 % Final  . Lymphs Abs 06/19/2015 1.6  0.7 - 4.0 K/uL Final  . Monocytes Relative 06/19/2015 10  3 - 12 % Final  . Monocytes Absolute 06/19/2015 0.5  0.1 - 1.0 K/uL Final  . Eosinophils Relative 06/19/2015 2  0 - 5 % Final  . Eosinophils Absolute 06/19/2015 0.1  0.0 - 0.7 K/uL Final  . Basophils Relative 06/19/2015 0  0 - 1 % Final  . Basophils Absolute 06/19/2015 0.0  0.0 - 0.1 K/uL Final  . Smear Review 06/19/2015 Criteria for review not met   Final  . Vit D, 25-Hydroxy 06/19/2015 35  30 - 100 ng/mL Final   Comment: Vitamin D Status           25-OH Vitamin D        Deficiency                <20 ng/mL        Insufficiency         20 - 29 ng/mL        Optimal             > or = 30 ng/mL   For 25-OH Vitamin D testing on patients on D2-supplementation and patients for whom quantitation of D2 and D3 fractions is required, the QuestAssureD 25-OH VIT D, (D2,D3), LC/MS/MS is recommended: order code [phone] [phone] (patients > 2 yrs).   Abstract on 06/18/2015  Component Date Value Ref Range Status  . HM Mammogram 06/10/2015 negative   Final   [facility]    Past Medical History  Diagnosis Date  . Colon polyps   . Osteoporosis   . Depression  history of   No past surgical history on file. Current Outpatient Prescriptions on File Prior to Visit  Medication Sig Dispense Refill  . Calcium Carbonate-Vitamin D (OS-CAL 500 + D PO) Take by mouth.    . cetirizine (ZYRTEC) 10 MG tablet Take 10 mg by mouth daily.    Marland Kitchen EPINEPHrine 0.3 mg/0.3 mL IJ SOAJ injection Inject 0.3 mLs (0.3 mg total) into the muscle once. 1 Device 1  . ferrous sulfate 325 (65 FE) MG tablet Take 325 mg by mouth daily with breakfast. As needed    . meloxicam (MOBIC) 15 MG tablet TAKE 1 TABLET BY MOUTH EVERY DAY 30 tablet 2  . Multiple Vitamins-Minerals (B COMPLEX PLUS VITAMIN C PO) Take by mouth. As needed     No current facility-administered medications on file prior to visit.    Allergies  Allergen Reactions  . Other Anaphylaxis    FIRE ANTS  . Penicillins Rash   Social History   Social History  . Marital Status: Married    Spouse Name: N/A  . Number of Children: N/A  . Years of Education: N/A   Occupational History  . Not on file.   Social History Main Topics  . Smoking status: Never Smoker   . Smokeless tobacco: Never Used  . Alcohol Use: No     Comment: 1-2 beers/wine in evening  . Drug Use: No  . Sexual Activity: Not on file     Comment: Married since 2005   Other Topics Concern  . Not on file   Social History Narrative  . No narrative on file   Family History  Problem Relation Age of Onset  . Heart disease Mother   . Cancer Mother     lung      Review of Systems  All other systems reviewed and are negative.      Objective:   Physical Exam  Constitutional: She is oriented to person, place, and time. She appears well-developed and well-nourished. No distress.  HENT:  Head: Normocephalic and atraumatic.  Right Ear: External ear normal.  Left Ear: External ear normal.  Nose: Nose normal.  Mouth/Throat: Oropharynx is clear and moist. No oropharyngeal exudate.  Eyes: Conjunctivae and EOM are normal. Pupils are equal, round, and reactive to light. Right eye exhibits no discharge. Left eye exhibits no discharge. No scleral icterus.  Neck: Normal range of motion. Neck supple. No JVD present. No tracheal deviation present. No thyromegaly present.  Cardiovascular: Normal rate, regular rhythm, normal heart sounds and intact distal pulses.  Exam reveals no gallop and no friction rub.   No murmur heard. Pulmonary/Chest: Effort normal and breath sounds normal. No stridor. No respiratory distress. She has no wheezes. She has no rales. She exhibits no tenderness.  Abdominal: Soft. Bowel sounds are normal. She exhibits no distension and no mass. There is no tenderness. There is no rebound and no guarding.  Musculoskeletal: Normal range of  motion. She exhibits no edema or tenderness.  Lymphadenopathy:    She has no cervical adenopathy.  Neurological: She is alert and oriented to person, place, and time. She has normal reflexes. She displays normal reflexes. No cranial nerve deficit. She exhibits normal muscle tone. Coordination normal.  Skin: Skin is warm. No rash noted. She is not diaphoretic. No erythema. No pallor.  Psychiatric: She has a normal mood and affect. Her behavior is normal. Judgment and thought content normal.  Vitals reviewed.         Assessment & Plan:  Routine general medical examination at a health care facility Physical exam is normal.  Cancer screen is up to date.  Immunizations are up to date.  Regular anticipatory guidance is provided.  I will give xanax 0.25 mg poq8 hrs prn.  Hopefully 90 tabs will last several months. If not, add prozac 20 mg poday to help moderate the anxiety and avoid dependence on xanax.

## 2015-06-29 NOTE — Telephone Encounter (Signed)
Refill appropriate and filled per protocol. 

## 2015-07-08 ENCOUNTER — Other Ambulatory Visit: Payer: Self-pay | Admitting: Family Medicine

## 2015-07-08 NOTE — Telephone Encounter (Signed)
Refill appropriate and filled per protocol. 

## 2015-07-21 DIAGNOSIS — H40013 Open angle with borderline findings, low risk, bilateral: Secondary | ICD-10-CM | POA: Diagnosis not present

## 2015-07-28 DIAGNOSIS — H40013 Open angle with borderline findings, low risk, bilateral: Secondary | ICD-10-CM | POA: Diagnosis not present

## 2015-08-12 ENCOUNTER — Other Ambulatory Visit: Payer: Self-pay | Admitting: Family Medicine

## 2015-08-13 NOTE — Telephone Encounter (Signed)
Medication called to pharmacy. 

## 2015-08-13 NOTE — Telephone Encounter (Signed)
ok 

## 2015-08-13 NOTE — Telephone Encounter (Signed)
?   OK to Refill  

## 2015-09-24 ENCOUNTER — Telehealth: Payer: Self-pay | Admitting: Family Medicine

## 2015-09-24 NOTE — Telephone Encounter (Signed)
PATIENT CALLING TO SPEAK WITH YOU REGARDING HER UPCOMING APPOINTMENT  AND IF SHE SHOULD STILL COME IN OR NOT  [phone]

## 2015-09-24 NOTE — Telephone Encounter (Signed)
Pt calls and stated that she is doing fine on the Xanax and that it is lasting her for several months and would like to cancel appt and if she sees that she needs to increase it or add another medication she will schedule an appt.

## 2015-09-29 ENCOUNTER — Ambulatory Visit: Payer: Medicare HMO | Admitting: Family Medicine

## 2015-10-08 ENCOUNTER — Other Ambulatory Visit: Payer: Self-pay | Admitting: Family Medicine

## 2015-10-08 NOTE — Telephone Encounter (Signed)
ok 

## 2015-10-08 NOTE — Telephone Encounter (Signed)
Ok to refill??  Last office visit 06/29/2015.  Last refill 08/24/2015.

## 2015-10-08 NOTE — Telephone Encounter (Signed)
Medication called to pharmacy. 

## 2015-11-11 ENCOUNTER — Other Ambulatory Visit: Payer: Self-pay | Admitting: Family Medicine

## 2015-11-11 ENCOUNTER — Encounter: Payer: Self-pay | Admitting: Physician Assistant

## 2015-11-11 ENCOUNTER — Ambulatory Visit (INDEPENDENT_AMBULATORY_CARE_PROVIDER_SITE_OTHER): Payer: Medicare Other | Admitting: Physician Assistant

## 2015-11-11 VITALS — BP 136/82 | HR 84 | Temp 97.0°F | Resp 16 | Ht 66.5 in | Wt 150.0 lb

## 2015-11-11 DIAGNOSIS — J988 Other specified respiratory disorders: Secondary | ICD-10-CM

## 2015-11-11 DIAGNOSIS — B9689 Other specified bacterial agents as the cause of diseases classified elsewhere: Principal | ICD-10-CM

## 2015-11-11 MED ORDER — AZITHROMYCIN 250 MG PO TABS: ORAL_TABLET | ORAL | Status: DC

## 2015-11-11 NOTE — Progress Notes (Signed)
    Patient ID: Cynthia [name] MRN: [id number], DOB: 11/02/1945, 70 y.o. Date of Encounter: 11/11/2015, 6:53 PM    Chief Complaint:  Chief Complaint  Patient presents with  . Chest & head congestion, cough     HPI: 70 y.o. year old female reports she has been sick for 10 - 11 days now. Started with headache, then stuffiness, then drainage, then sore throat. Now a lot in chest. Using Mucinex. No known fever/chills.      Home Meds:   Outpatient Prescriptions Prior to Visit  Medication Sig Dispense Refill  . ALPRAZolam (XANAX) 0.25 MG tablet TAKE 1 TABLET THREE TIMES A DAY AS NEEDED FOR ANXIETY 90 tablet 0  . Calcium Carbonate-Vitamin D (OS-CAL 500 + D PO) Take by mouth.    . cetirizine (ZYRTEC) 10 MG tablet Take 10 mg by mouth daily.    Marland Kitchen EPIPEN 2-PAK 0.3 MG/0.3ML SOAJ injection INJECT 0.3 MLS (0.3 MG TOTAL) INTO THE MUSCLE ONCE. 2 Device 0  . ferrous sulfate 325 (65 FE) MG tablet Take 325 mg by mouth daily with breakfast. As needed    . meloxicam (MOBIC) 15 MG tablet TAKE 1 TABLET BY MOUTH EVERY DAY 30 tablet 2  . Multiple Vitamins-Minerals (B COMPLEX PLUS VITAMIN C PO) Take by mouth. As needed     No facility-administered medications prior to visit.    Allergies:  Allergies  Allergen Reactions  . Other Anaphylaxis    FIRE ANTS  . Penicillins Rash      Review of Systems: See HPI for pertinent ROS. All other ROS negative.    Physical Exam: Blood pressure 136/82, pulse 84, temperature 97 F (36.1 C), temperature source Oral, resp. rate 16, height 5' 6.5" (1.689 m), weight 150 lb (68.04 kg)., Body mass index is 23.85 kg/(m^2). General:  WNWD WF. Appears in no acute distress. HEENT: Normocephalic, atraumatic, eyes without discharge, sclera non-icteric, nares are without discharge. Bilateral auditory canals clear, TM's are without perforation, pearly grey and translucent with reflective cone of light bilaterally. Oral cavity moist, posterior pharynx without exudate,  erythema, peritonsillar abscess. No tenderness with percussion of frontal or maxillary sinuses.   Neck: Supple. No thyromegaly. No lymphadenopathy. Lungs: Clear bilaterally to auscultation without wheezes, rales, or rhonchi. Breathing is unlabored. Heart: Regular rhythm. No murmurs, rubs, or gallops. Msk:  Strength and tone normal for age. Extremities/Skin: Warm and dry.  Neuro: Alert and oriented X 3. Moves all extremities spontaneously. Gait is normal. CNII-XII grossly in tact. Psych:  Responds to questions appropriately with a normal affect.     ASSESSMENT AND PLAN:  70 y.o. year old female with  1. Bacterial respiratory infection Penicillin Allergy. She is to take antibiotic as directed. Cont Mucinex DM as expectorant. F/U if symptoms do not resolve wihtin one week of completion of abx. - azithromycin (ZITHROMAX) 250 MG tablet; Day 1: Take 2 daily.  Days 2-5: Take 1 daily.  Dispense: 6 tablet; Refill: 0   Signed, [address] [place], [place], [facility] 11/11/2015 6:53 PM

## 2015-11-11 NOTE — Telephone Encounter (Signed)
Ok to refill??  Last office visit 06/29/2015.  Last refill 10/08/2015.

## 2015-11-12 NOTE — Telephone Encounter (Signed)
Medication called to pharmacy. 

## 2015-11-12 NOTE — Telephone Encounter (Signed)
ok 

## 2016-02-04 ENCOUNTER — Other Ambulatory Visit: Payer: Self-pay | Admitting: Family Medicine

## 2016-02-04 NOTE — Telephone Encounter (Signed)
Ok to refill??  Last office visit 11/11/2015.  Last refill 11/12/2015.

## 2016-02-05 NOTE — Telephone Encounter (Signed)
rx called in

## 2016-02-05 NOTE — Telephone Encounter (Signed)
ok 

## 2016-03-20 ENCOUNTER — Inpatient Hospital Stay (HOSPITAL_COMMUNITY): Payer: Medicare Other

## 2016-03-20 ENCOUNTER — Inpatient Hospital Stay (HOSPITAL_COMMUNITY)
Admission: EM | Admit: 2016-03-20 | Discharge: 2016-03-24 | DRG: 064 | Disposition: A | Discharge status: Rehabilitation Facility | Payer: Medicare Other | Attending: Neurology | Admitting: Neurology

## 2016-03-20 ENCOUNTER — Emergency Department (HOSPITAL_COMMUNITY): Payer: Medicare Other

## 2016-03-20 ENCOUNTER — Encounter (HOSPITAL_COMMUNITY): Payer: Self-pay | Admitting: Emergency Medicine

## 2016-03-20 DIAGNOSIS — E871 Hypo-osmolality and hyponatremia: Secondary | ICD-10-CM | POA: Insufficient documentation

## 2016-03-20 DIAGNOSIS — R414 Neurologic neglect syndrome: Secondary | ICD-10-CM | POA: Diagnosis not present

## 2016-03-20 DIAGNOSIS — R4701 Aphasia: Secondary | ICD-10-CM | POA: Diagnosis present

## 2016-03-20 DIAGNOSIS — I610 Nontraumatic intracerebral hemorrhage in hemisphere, subcortical: Secondary | ICD-10-CM | POA: Diagnosis not present

## 2016-03-20 DIAGNOSIS — I611 Nontraumatic intracerebral hemorrhage in hemisphere, cortical: Principal | ICD-10-CM | POA: Diagnosis present

## 2016-03-20 DIAGNOSIS — I615 Nontraumatic intracerebral hemorrhage, intraventricular: Secondary | ICD-10-CM | POA: Diagnosis not present

## 2016-03-20 DIAGNOSIS — D62 Acute posthemorrhagic anemia: Secondary | ICD-10-CM | POA: Insufficient documentation

## 2016-03-20 DIAGNOSIS — G936 Cerebral edema: Secondary | ICD-10-CM | POA: Diagnosis present

## 2016-03-20 DIAGNOSIS — S069X9A Unspecified intracranial injury with loss of consciousness of unspecified duration, initial encounter: Secondary | ICD-10-CM | POA: Insufficient documentation

## 2016-03-20 DIAGNOSIS — M810 Age-related osteoporosis without current pathological fracture: Secondary | ICD-10-CM | POA: Diagnosis present

## 2016-03-20 DIAGNOSIS — R739 Hyperglycemia, unspecified: Secondary | ICD-10-CM | POA: Diagnosis present

## 2016-03-20 DIAGNOSIS — G8194 Hemiplegia, unspecified affecting left nondominant side: Secondary | ICD-10-CM | POA: Diagnosis not present

## 2016-03-20 DIAGNOSIS — I10 Essential (primary) hypertension: Secondary | ICD-10-CM | POA: Diagnosis present

## 2016-03-20 DIAGNOSIS — S06349D Traumatic hemorrhage of right cerebrum with loss of consciousness of unspecified duration, subsequent encounter: Secondary | ICD-10-CM | POA: Diagnosis not present

## 2016-03-20 DIAGNOSIS — G441 Vascular headache, not elsewhere classified: Secondary | ICD-10-CM | POA: Insufficient documentation

## 2016-03-20 DIAGNOSIS — S069XAA Unspecified intracranial injury with loss of consciousness status unknown, initial encounter: Secondary | ICD-10-CM | POA: Insufficient documentation

## 2016-03-20 DIAGNOSIS — R4781 Slurred speech: Secondary | ICD-10-CM | POA: Diagnosis present

## 2016-03-20 DIAGNOSIS — R41844 Frontal lobe and executive function deficit: Secondary | ICD-10-CM | POA: Diagnosis not present

## 2016-03-20 DIAGNOSIS — I619 Nontraumatic intracerebral hemorrhage, unspecified: Secondary | ICD-10-CM | POA: Diagnosis present

## 2016-03-20 LAB — URINALYSIS, ROUTINE W REFLEX MICROSCOPIC
Bilirubin Urine: NEGATIVE
Glucose, UA: NEGATIVE mg/dL
Hgb urine dipstick: NEGATIVE
Ketones, ur: NEGATIVE mg/dL
LEUKOCYTES UA: NEGATIVE
NITRITE: NEGATIVE
PH: 7.5 (ref 5.0–8.0)
Protein, ur: NEGATIVE mg/dL
SPECIFIC GRAVITY, URINE: 1.009 (ref 1.005–1.030)

## 2016-03-20 LAB — ETHANOL: Alcohol, Ethyl (B): <5 mg/dL (ref <5)

## 2016-03-20 LAB — COMPREHENSIVE METABOLIC PANEL
ALBUMIN: 4 g/dL (ref 3.5–5.0)
ALK PHOS: 80 U/L (ref 38–126)
ALT: 15 U/L (ref 14–54)
AST: 26 U/L (ref 15–41)
Anion gap: 9 (ref 5–15)
BILIRUBIN TOTAL: 0.8 mg/dL (ref 0.3–1.2)
BUN: 11 mg/dL (ref 6–20)
CALCIUM: 9.5 mg/dL (ref 8.9–10.3)
CO2: 24 mmol/L (ref 22–32)
Chloride: 103 mmol/L (ref 101–111)
Creatinine, Ser: 0.69 mg/dL (ref 0.44–1.00)
GFR calc Af Amer: >60 mL/min (ref >60)
GFR calc non Af Amer: >60 mL/min (ref >60)
Glucose, Bld: 139 mg/dL — ABNORMAL HIGH (ref 65–99)
POTASSIUM: 3.8 mmol/L (ref 3.5–5.1)
SODIUM: 136 mmol/L (ref 135–145)
TOTAL PROTEIN: 6.6 g/dL (ref 6.5–8.1)

## 2016-03-20 LAB — I-STAT CHEM 8, ED
BUN: 12 mg/dL (ref 6–20)
CHLORIDE: 102 mmol/L (ref 101–111)
CREATININE: 0.7 mg/dL (ref 0.44–1.00)
Calcium, Ion: 1.1 mmol/L — ABNORMAL LOW (ref 1.12–1.23)
Glucose, Bld: 139 mg/dL — ABNORMAL HIGH (ref 65–99)
HEMATOCRIT: 39 % (ref 36.0–46.0)
Hemoglobin: 13.3 g/dL (ref 12.0–15.0)
POTASSIUM: 3.8 mmol/L (ref 3.5–5.1)
Sodium: 137 mmol/L (ref 135–145)
TCO2: 24 mmol/L (ref 0–100)

## 2016-03-20 LAB — I-STAT TROPONIN, ED: Troponin i, poc: 0 ng/mL (ref 0.00–0.08)

## 2016-03-20 LAB — DIFFERENTIAL
BASOS ABS: 0 10*3/uL (ref 0.0–0.1)
Basophils Relative: 0 %
Eosinophils Absolute: 0 10*3/uL (ref 0.0–0.7)
Eosinophils Relative: 0 %
LYMPHS ABS: 1.4 10*3/uL (ref 0.7–4.0)
LYMPHS PCT: 18 %
Monocytes Absolute: 0.6 10*3/uL (ref 0.1–1.0)
Monocytes Relative: 8 %
NEUTROS PCT: 74 %
Neutro Abs: 5.7 10*3/uL (ref 1.7–7.7)

## 2016-03-20 LAB — GLUCOSE, CAPILLARY: Glucose-Capillary: 148 mg/dL — ABNORMAL HIGH (ref 65–99)

## 2016-03-20 LAB — CBC
HCT: 38.7 % (ref 36.0–46.0)
HEMOGLOBIN: 13 g/dL (ref 12.0–15.0)
MCH: 30 pg (ref 26.0–34.0)
MCHC: 33.6 g/dL (ref 30.0–36.0)
MCV: 89.4 fL (ref 78.0–100.0)
PLATELETS: 290 10*3/uL (ref 150–400)
RBC: 4.33 MIL/uL (ref 3.87–5.11)
RDW: 12.7 % (ref 11.5–15.5)
WBC: 7.8 10*3/uL (ref 4.0–10.5)

## 2016-03-20 LAB — APTT: APTT: 32 s (ref 24–37)

## 2016-03-20 LAB — MRSA PCR SCREENING: MRSA by PCR: NEGATIVE

## 2016-03-20 LAB — RAPID URINE DRUG SCREEN, HOSP PERFORMED
Amphetamines: NOT DETECTED
Barbiturates: NOT DETECTED
Benzodiazepines: NOT DETECTED
COCAINE: NOT DETECTED
OPIATES: NOT DETECTED
Tetrahydrocannabinol: NOT DETECTED

## 2016-03-20 LAB — PROTIME-INR
INR: 1.03 (ref 0.00–1.49)
Prothrombin Time: 13.7 seconds (ref 11.6–15.2)

## 2016-03-20 MED ORDER — NICARDIPINE HCL IN NACL 20-0.86 MG/200ML-% IV SOLN: 0.0000 mg/h | INTRAVENOUS | Status: DC

## 2016-03-20 MED ORDER — SODIUM CHLORIDE 0.9 % IV SOLN
INTRAVENOUS | Status: DC
  Administered 2016-03-20 – 2016-03-24 (×2): via INTRAVENOUS

## 2016-03-20 MED ORDER — MORPHINE SULFATE (PF) 2 MG/ML IV SOLN
1.0000 mg | INTRAVENOUS | Status: DC | PRN
  Administered 2016-03-20 – 2016-03-22 (×2): 2 mg via INTRAVENOUS
  Filled 2016-03-20 (×2): qty 1

## 2016-03-20 MED ORDER — LABETALOL HCL 5 MG/ML IV SOLN: 20.0000 mg | Freq: Once | INTRAVENOUS | Status: DC

## 2016-03-20 MED ORDER — ACETAMINOPHEN 650 MG RE SUPP: 650.0000 mg | RECTAL | Status: DC | PRN

## 2016-03-20 MED ORDER — ONDANSETRON HCL 4 MG/2ML IJ SOLN
4.0000 mg | INTRAMUSCULAR | Status: DC | PRN
  Administered 2016-03-21 – 2016-03-22 (×5): 4 mg via INTRAVENOUS
  Filled 2016-03-20 (×5): qty 2

## 2016-03-20 MED ORDER — ACETAMINOPHEN 325 MG PO TABS
650.0000 mg | ORAL_TABLET | ORAL | Status: DC | PRN
  Administered 2016-03-20 – 2016-03-23 (×5): 650 mg via ORAL
  Filled 2016-03-20 (×6): qty 2

## 2016-03-20 MED ORDER — SENNOSIDES-DOCUSATE SODIUM 8.6-50 MG PO TABS
1.0000 | ORAL_TABLET | Freq: Two times a day (BID) | ORAL | Status: DC
  Administered 2016-03-20 – 2016-03-24 (×7): 1 via ORAL
  Filled 2016-03-20 (×7): qty 1

## 2016-03-20 MED ORDER — FAMOTIDINE IN NACL 20-0.9 MG/50ML-% IV SOLN
20.0000 mg | Freq: Two times a day (BID) | INTRAVENOUS | Status: DC
  Administered 2016-03-21: 20 mg via INTRAVENOUS
  Filled 2016-03-20: qty 50

## 2016-03-20 MED ORDER — STROKE: EARLY STAGES OF RECOVERY BOOK
Freq: Once | Status: DC
  Filled 2016-03-20: qty 1

## 2016-03-20 MED ORDER — ALPRAZOLAM 0.25 MG PO TABS
0.2500 mg | ORAL_TABLET | Freq: Two times a day (BID) | ORAL | Status: DC | PRN
  Administered 2016-03-24: 0.25 mg via ORAL
  Filled 2016-03-20: qty 1

## 2016-03-20 NOTE — ED Notes (Signed)
MD ray at bedside.

## 2016-03-20 NOTE — ED Notes (Signed)
Pt to mri 

## 2016-03-20 NOTE — H&P (Signed)
Neurology Admission History and Physical  Requesting provider: Pattricia Boss, MD  CC: headache, trouble getting words out  HPI: This is a 70 year old right-handed woman who presents to the [facility] Emergency department after being sent from her doctor's office due to concern for possible stroke. History is obtained directly from the patient who is a good historian. She is accompanied by family members to offer additional information as needed.  The patient reports that last night she developed a sense of feeling disoriented and having difficulty speaking. When asked about her speech, she describes that she has trouble focusing her thoughts and getting her words out. She reports that her sister also noticed that she seemed slow to respond to questions. She feels as if her speech is somewhat slurred as well. She does not endorse any weakness, numbness, vision changes, difficulty swallowing, or balance impairments. However, she has noticed that she will find her left arm in unusual postures and positions without being aware that it is doing anything. When she looks at it, she is able to control it without a problem and again denies any weakness or sensory changes. She has also had a headache, though it sounds as if this may have been present for the past 2 days. She currently reports her headache is improved. She denies any similar previous episodes.  She denies any recent trauma to the head. She does note that she was working with her brother at her home and had been up on a ladder. She was getting off the ladder, she thought she was on the ground but instead was on the second step of the latter when she stumbled backwards. She says that she struck her bottom on a nearby chair, then fell to the floor landing on her bottom. She did not strike her head. She has not had any recent illness. She does not use daily antiplatelet therapy or anticoagulation. She uses occasional Advil for headache and pain and has  taken a total of six 200 mg tablets since last night. She also reports that she took a baby aspirin earlier today as recommended by her sister because of her symptoms.  PMH:  Past Medical History  Diagnosis Date  . Colon polyps   . Osteoporosis   . Depression     history of    PSH:  1. Tubal ligation 2. Tonsillectomy as a child, age 50  Family history: Mother had heart disease and what she describes as an incarcerated intestine. She had a maternal aunt who had a cerebral hemorrhage. Her paternal grandmother had strokes.   Social history:  She is widowed. She lives independently. 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 She denies any history of tobacco use apart from occasionally taking a drag off of her brothers cigarettes as a teenager. She smoked marijuana as a teenager, none since that time. She denies any other illicit drug use. She does drink one to 2 alcoholic beverages per night, either beer or wine. Eyes and history of heavy alcohol use.   Current outpatient meds: Current Meds  Medication Sig  . ALPRAZolam (XANAX) 0.25 MG tablet TAKE 1 TABLET 3 TIMES DAILY AS NEEDED FOR ANXIETY (Patient taking differently: TAKE 1 TABLET 2 TIMES DAILY AS NEEDED FOR ANXIETY)  . Calcium Carbonate-Vitamin D (OS-CAL 500 + D PO) Take 1 tablet by mouth daily.   . meloxicam (MOBIC)  15 MG tablet TAKE 1 TABLET BY MOUTH EVERY DAY (Patient taking differently: TAKE 1 TABLET (15 mg) BY MOUTH EVERY DAY as needed for lower back pain)    Current inpatient meds:  Current Facility-Administered Medications  Medication Dose Route Frequency Provider Last Rate Last Dose  .  stroke: mapping our early stages of recovery book   Does not apply Once Darrel Reach, MD      . acetaminophen (TYLENOL) tablet 650 mg  650 mg Oral Q4H PRN Darrel Reach, MD       Or  . acetaminophen  (TYLENOL) suppository 650 mg  650 mg Rectal Q4H PRN Darrel Reach, MD      . ALPRAZolam Duanne Moron) tablet 0.25 mg  0.25 mg Oral BID PRN Darrel Reach, MD      . famotidine (PEPCID) IVPB 20 mg premix  20 mg Intravenous Q12H Darrel Reach, MD      . labetalol (NORMODYNE,TRANDATE) injection 20 mg  20 mg Intravenous Once Darrel Reach, MD       And  . nicardipine (CARDENE) 20mg  in 0.86% saline 278ml IV infusion (0.1 mg/ml)  0-15 mg/hr Intravenous Continuous Darrel Reach, MD      . morphine 2 MG/ML injection 1-4 mg  1-4 mg Intravenous Q1H PRN Darrel Reach, MD      . senna-docusate (Senokot-S) tablet 1 tablet  1 tablet Oral BID Darrel Reach, MD       Current Outpatient Prescriptions  Medication Sig Dispense Refill  . ALPRAZolam (XANAX) 0.25 MG tablet TAKE 1 TABLET 3 TIMES DAILY AS NEEDED FOR ANXIETY (Patient taking differently: TAKE 1 TABLET 2 TIMES DAILY AS NEEDED FOR ANXIETY) 90 tablet 0  . Calcium Carbonate-Vitamin D (OS-CAL 500 + D PO) Take 1 tablet by mouth daily.     . meloxicam (MOBIC) 15 MG tablet TAKE 1 TABLET BY MOUTH EVERY DAY (Patient taking differently: TAKE 1 TABLET (15 mg) BY MOUTH EVERY DAY as needed for lower back pain) 30 tablet 2  . cetirizine (ZYRTEC) 10 MG tablet Take 10 mg by mouth daily as needed for allergies.     Marland Kitchen EPIPEN 2-PAK 0.3 MG/0.3ML SOAJ injection INJECT 0.3 MLS (0.3 MG TOTAL) INTO THE MUSCLE ONCE. 2 Device 0  . ferrous sulfate 325 (65 FE) MG tablet Take 325 mg by mouth daily as needed (to improve hemoglobin).       Allergies: Allergies  Allergen Reactions  . Other Anaphylaxis    FIRE ANTS  . Penicillins Rash    Has patient had a PCN reaction causing immediate rash, facial/tongue/throat swelling, SOB or lightheadedness with hypotension: Unknown Has patient had a PCN reaction causing severe rash involving mucus membranes or skin necrosis: No Has patient had a PCN reaction that required hospitalization No Has patient had a  PCN reaction occurring within the last 10 years: No If all of the above answers are "NO", then may proceed with Cephalosporin use.     ROS: As per HPI. A full 14-point review of systems was performed and is otherwise Unremarkable.  PE:  BP 148/85 mmHg  Pulse 76  Temp(Src) 97.6 F (36.4 C) (Oral)  Resp 16  Ht 5\' 6"  (1.676 m)  Wt 68.04 kg (150 lb)  BMI 24.22 kg/m2  SpO2 93%  General: WDWN Caucasian woman resting in the ER, no acute distress. AAO x4. Speech clear, no dysarthria. No aphasia. She has evident psychomotor slowing. It takes her 2-3 seconds to respond to questions.  She does answer appropriately, just slowly. Affect is restricted. Comportment is normal.  HEENT: Normocephalic. Neck supple without LAD. MMM, OP clear. Dentition good. Sclerae anicteric. No conjunctival injection.  CV: Regular, no murmur. Carotid pulses full and symmetric, no bruits. Distal pulses 2+ and symmetric.  Lungs: CTAB.  Abdomen: Soft, non-distended, non-tender. Bowel sounds present x4.  Extremities: No C/C/E. Neuro:  CN: Pupils are equal and round. They are symmetrically reactive from 3-->2 mm. EOMI without nystagmus. No reported diplopia. Facial sensation is intact to light touch. Face is symmetric at rest with normal strength an mobility. Hearing is intact to conversational voice. Palate elevates symmetrically and uvula is midline. Voice is normal in tone, pitch and quality. Bilateral SCM and trapezii are 5/5. Tongue is midline with normal bulk and mobility.  Motor: Normal bulk, tone, and strength. She appears to have motor perseveration with the left upper extremity. No overt neglect is appreciated No tremor or other abnormal movements. No drift.  Sensation: intact to light touch and pinprick. Vibration is diminished to the level of the ankle in both feet. She appears to have mild impairment in joint position as well, this is somewhat inconsistent.  DTRs: 2+ in the right upper extremity. Reflexes are 3+ in  the left upper extremity and both lower extremities with the exception of clonus at the right knee. She has bilateral crossed abductors, more pronounced on the left than the right. Toes are mute bilaterally. No Hoffman's.  Coordination: Finger-to-nose is mildly unsteady on the left, normal on the right. Heel-to-shin is without dysmetria. Finger taps are slower and more deliberate with the left hand.   Labs:  Lab Results  Component Value Date   WBC 7.8 03/20/2016   HGB 13.3 03/20/2016   HCT 39.0 03/20/2016   PLT 290 03/20/2016   GLUCOSE 139* 03/20/2016   CHOL 173 06/19/2015   TRIG 38 06/19/2015   HDL 100 06/19/2015   LDLCALC 65 06/19/2015   ALT 15 03/20/2016   AST 26 03/20/2016   NA 137 03/20/2016   K 3.8 03/20/2016   CL 102 03/20/2016   CREATININE 0.70 03/20/2016   BUN 12 03/20/2016   CO2 24 03/20/2016   TSH 4.294 06/19/2015   INR 1.03 03/20/2016   TUDS negative UA negative  PTT 32 EtOH <5  Imaging:  I have personally and independently reviewed the CT scan of the head without contrast from today's visit. This shows a large acute intraparenchymal hemorrhage involving the right frontal lobe. This measures 30 x 34 x 39 mm per radiology measurements. This gives an estimated hemorrhage volume of 20 mL. There is mild degree of surrounding vasogenic edema resulting in regional mass effect but no shift. There is no hydrocephalus. There does not appear to be any blood within the ventricles. She has a mild degree of scattered patchy hypodensities in the bihemispheric white matter suggestive of chronic small vessel ischemic change.   Assessment and Plan:  1. Intracerebral hemorrhage: She has a large right frontal intracerebral bleed. This is a lobar and thus most consistent with amyloid angiopathy, though hypertensive hemorrhage would also be possible. Less likely considerations would include underlying AVM or vasculitis. She has no known history of hypertension and states that her systolic  blood pressures typically run in the 130s. She is mildly hypertensive here in the emergency department with systolic pressures ranging from 145-163. She denies regular use of antiplatelet agents and anticoagulants and only uses Advil on occasion. She denies any head trauma. At this point,  should it be admitted to the neuro ICU for monitoring. Blood pressure will be watched closely with goal systolic blood pressure between 140 and 160. She has no evidence of coagulopathy. I will order MRI scan of the brain to further assess her hemorrhage as this is the most sensitive exam for amyloid angiopathy. I will initiate IV fluids with normal saline 75 mL per hour. Hypotonic fluid should be avoided as this can exacerbate cerebral edema in this setting. Hyperglycemia and fever should also be aggressively treated when present as these can worsen neurologic outcome. Antiplatelet agents, nonsteroidal anti-inflammatory drugs, and anticoagulants should be avoided at this time. If her hemorrhage remains stable on imaging for 24 hours, can consider heparin or low molecular weight heparin for DVT prophylaxis as needed. In the meantime she should have SCDs in place. I will make her NPO until swallow evaluation can be performed and she is cleared for oral intake.  2. Left upper extremity motor perseveration: She doesn't have any frank weakness or sensory loss in her left arm. However, she does demonstrate motor perseveration and perhaps some mild apraxia as well. This would be consistent with her right frontal hemorrhage. Recommend occupational therapy to evaluate and treat.  3. Cognitive impairment: She demonstrates evidence of slowed speed of processing which is consistent with her right frontal hemorrhage. I anticipate that this will improve in time. If it persists, consideration should be given to cognitive evaluation by speech therapy and cognitive rehabilitation as needed.  4. Headache: At this point, her headache is  improved. Ensure symptomatic pain control as needed.  This was discussed with the patient and her family members in the emergency department. They are in agreement with the plan as noted. They were given the opportunity to ask any questions and these were addressed to their satisfaction. I have also discussed the case with Dr. Jeanell Sparrow in the emergency department.

## 2016-03-20 NOTE — ED Notes (Signed)
Report called to josh on 14m

## 2016-03-20 NOTE — ED Notes (Signed)
Pt sent from here doctors office for stroke like symptoms. Pt states yesterday evening around 8pm she started feeling "disoriented" having slurred speech and difficultly "getting her words out" PT also noted to have poor balance and headache.

## 2016-03-20 NOTE — Progress Notes (Signed)
eLink Physician-Brief Progress Note Patient Name: Cynthia [name] DOB: 08-29-1946 MRN: [id number]   Date of Service  03/20/2016  HPI/Events of Note  70 yr old female presented from [facility] office secondary to concern for CVA.  Patient developed trouble with speech and some disorientation by her report.  CT brain revealed ICH.  Amyloid angiopathy vs hypertensive hemorrhage.  Neurology is admitting.  eICU Interventions  Orders reviewed. We are available if needed     Intervention Category Evaluation Type: New Patient Evaluation  Mauri Brooklyn, P 03/20/2016, 6:28 PM

## 2016-03-20 NOTE — ED Notes (Signed)
Dr. Shon Hale, neuro, MD at bedside.

## 2016-03-20 NOTE — ED Notes (Signed)
The pt has a rt sided headache mild  She has passed her swallow screen bp good

## 2016-03-20 NOTE — ED Notes (Signed)
Mri called they will be coming to take her to the mri.. Neuro check performed pt alert family at the bedside

## 2016-03-20 NOTE — ED Provider Notes (Signed)
CSN: [id number]     Arrival date & time 03/20/16  1228 History   First MD Initiated Contact with Patient 03/20/16 1240     Chief Complaint  Patient presents with  . Stroke Symptoms    An emergency department physician performed an initial assessment on this suspected stroke patient at 1234. (Consider location/radiation/quality/duration/timing/severity/associated sxs/prior Treatment) HPI 70 y.o female with sudden onset of headache and left sided weakness last night at 8 p.m This occurred >12 hours pte.  She was with family in [place].  She continues to have some headache.  No previous similar symptoms.  She denies any blood thinners or head injury, although she fell from the first step of a ladder several days ago.  Past Medical History  Diagnosis Date  . Colon polyps   . Osteoporosis   . Depression     history of   History reviewed. No pertinent past surgical history. Family History  Problem Relation Age of Onset  . Heart disease Mother   . Cancer Mother     lung   Social History  Substance Use Topics  . Smoking status: Never Smoker   . Smokeless tobacco: Never Used  . Alcohol Use: No     Comment: 1-2 beers/wine in evening   OB History    No data available     Review of Systems  All other systems reviewed and are negative.     Allergies  Other and Penicillins  Home Medications   Prior to Admission medications   Medication Sig Start Date End Date Taking? Authorizing Provider  ALPRAZolam (XANAX) 0.25 MG tablet TAKE 1 TABLET 3 TIMES DAILY AS NEEDED FOR ANXIETY Patient taking differently: TAKE 1 TABLET 2 TIMES DAILY AS NEEDED FOR ANXIETY 02/05/16  Yes Susy Frizzle, MD  Calcium Carbonate-Vitamin D (OS-CAL 500 + D PO) Take 1 tablet by mouth daily.    Yes Historical Provider, MD  meloxicam (MOBIC) 15 MG tablet TAKE 1 TABLET BY MOUTH EVERY DAY Patient taking differently: TAKE 1 TABLET (15 mg) BY MOUTH EVERY DAY as needed for lower back pain 07/08/15  Yes Susy Frizzle, MD  cetirizine (ZYRTEC) 10 MG tablet Take 10 mg by mouth daily as needed for allergies.     Historical Provider, MD  EPIPEN 2-PAK 0.3 MG/0.3ML SOAJ injection INJECT 0.3 MLS (0.3 MG TOTAL) INTO THE MUSCLE ONCE. 06/29/15   Susy Frizzle, MD  ferrous sulfate 325 (65 FE) MG tablet Take 325 mg by mouth daily as needed (to improve hemoglobin).     Historical Provider, MD   BP 154/66 mmHg  Pulse 83  Temp(Src) 97.3 F (36.3 C) (Oral)  Resp 15  Ht 5\' 6"  (1.676 m)  Wt 68.04 kg  BMI 24.22 kg/m2  SpO2 99% Physical Exam  Constitutional: She is oriented to person, place, and time. She appears well-developed and well-nourished. No distress.  HENT:  Head: Normocephalic and atraumatic.  Right Ear: External ear normal.  Left Ear: External ear normal.  Nose: Nose normal.  Mouth/Throat: Oropharynx is clear and moist.  Eyes: Conjunctivae and EOM are normal. Pupils are equal, round, and reactive to light.  Neck: Normal range of motion. Neck supple.  Cardiovascular: Normal rate, regular rhythm, normal heart sounds and intact distal pulses.   Pulmonary/Chest: Effort normal and breath sounds normal.  Abdominal: Soft. Bowel sounds are normal.  Musculoskeletal: Normal range of motion.  Neurological: She is alert and oriented to person, place, and time. No cranial nerve deficit or sensory  deficit. She displays a negative Romberg sign.  Reflex Scores:      Bicep reflexes are 1+ on the right side and 1+ on the left side.      Patellar reflexes are 2+ on the right side and 3+ on the left side.      Achilles reflexes are 1+ on the right side and 2+ on the left side. lle and lue drift  Nursing note and vitals reviewed.   ED Course  Procedures (including critical care time) Labs Review Labs Reviewed  COMPREHENSIVE METABOLIC PANEL - Abnormal; Notable for the following:    Glucose, Bld 139 (*)    All other components within normal limits  I-STAT CHEM 8, ED - Abnormal; Notable for the following:     Glucose, Bld 139 (*)    Calcium, Ion 1.10 (*)    All other components within normal limits  ETHANOL  PROTIME-INR  APTT  CBC  DIFFERENTIAL  URINE RAPID DRUG SCREEN, HOSP PERFORMED  URINALYSIS, ROUTINE W REFLEX MICROSCOPIC (NOT AT [facility])  BASIC METABOLIC PANEL  CBC  LIPID PANEL  I-STAT TROPOININ, ED    Imaging Review Ct Head Wo Contrast  03/20/2016  ADDENDUM REPORT: 03/20/2016 14:15 ADDENDUM: Study discussed by telephone with Dr. Andee Poles Samvel Zinn on 03/20/2016 at 14:14 . At 1407 hours. Electronically Signed   By: Genevie Ann M.D.   On: 03/20/2016 14:15  03/20/2016  CLINICAL DATA:  70 year old female with sudden onset left side weakness last night at 2000 hours. Headache. No known trauma. Initial encounter. EXAM: CT HEAD WITHOUT CONTRAST TECHNIQUE: Contiguous axial images were obtained from the base of the skull through the vertex without intravenous contrast. COMPARISON:  None. FINDINGS: No osseous abnormality identified. Trace fluid in the left sphenoid sinus. Other Visualized paranasal sinuses and mastoids are well pneumatized. Negative visualized orbit and scalp soft tissues. Mild Calcified atherosclerosis at the skull base. Intra-axial parenchymal hemorrhage in the anterior right frontal lobe is somewhat flame shaped and encompasses 30 x 34 x 39 mL (AP by transverse by CC) foreign estimated hemorrhage volume of 20 mL. The hemorrhage extends to the interhemispheric fissure, and it is difficult to exclude a trace subdural component along the falx at this time (series 21, image 19). No intraventricular extension. No other extra-axial hemorrhage identified. Surrounding hypodense edema with mild regional mass effect. 1-2 mm of focal leftward midline shift. Mild mass effect on the right frontal horn. Basilar cisterns remain patent. No ventriculomegaly. Patchy bilateral cerebral white matter hypodensity elsewhere. No other acute cortically based infarct. No suspicious intracranial vascular hyperdensity.  IMPRESSION: 1. Acute anterior right frontal lobe intra-axial hemorrhage. Estimated hemorrhage volume 20 mL. Possible trace subdural extension at this time along the interhemispheric fissure. 2. Surrounding edema with mild regional mass effect. No intraventricular or other extra-axial extension. 3. Underlying moderate nonspecific cerebral white matter signal changes. 4. Top differential considerations include hypertensive/small vessel related hemorrhage, sequelae of acute coagulopathy, amyloid angiopathy, and less likely vascular malformation or tumor related. Electronically Signed: By: Genevie Ann M.D. On: 03/20/2016 14:02   I have personally reviewed and evaluated these images and lab results as part of my medical decision-making.   EKG Interpretation   Date/Time:  Sunday March 20 2016 12:36:16 EDT Ventricular Rate:  87 PR Interval:    QRS Duration: 77 QT Interval:  373 QTC Calculation: 449 R Axis:   31 Text Interpretation:  Sinus rhythm Anterior infarct, old Minimal ST  depression, lateral leads Confirmed by Miranda Garber MD, Venisa Frampton [phone]) on  03/20/2016 5:10:49 PM      MDM   Final diagnoses:  ICH (intracerebral hemorrhage) ([place])    Discussed with Drs. Suzan Nailer  Dr. Shon Hale in to see and admit. CRITICAL CARE Performed by: Shaune Pollack Total critical care time: 45 minutes Critical care time was exclusive of separately billable procedures and treating other patients. Critical care was necessary to treat or prevent imminent or life-threatening deterioration. Critical care was time spent personally by me on the following activities: development of treatment plan with patient and/or surrogate as well as nursing, discussions with consultants, evaluation of patient's response to treatment, examination of patient, obtaining history from patient or surrogate, ordering and performing treatments and interventions, ordering and review of laboratory studies, ordering and review of radiographic studies,  pulse oximetry and re-evaluation of patient's condition.     Pattricia Boss, MD 03/20/16 [phone]

## 2016-03-20 NOTE — ED Notes (Signed)
The pt has to be coaxed to squeeze with lt arma nd hand  Lt leg is slower to be lifted and placed down.  Minimal delay when answering questions asked

## 2016-03-21 ENCOUNTER — Inpatient Hospital Stay (HOSPITAL_COMMUNITY): Payer: Medicare Other

## 2016-03-21 ENCOUNTER — Encounter (HOSPITAL_COMMUNITY): Payer: Self-pay | Admitting: Radiology

## 2016-03-21 LAB — LIPID PANEL
CHOL/HDL RATIO: 2 ratio
Cholesterol: 181 mg/dL (ref 0–200)
HDL: 90 mg/dL (ref >40)
LDL CALC: 84 mg/dL (ref 0–99)
Triglycerides: 37 mg/dL (ref <150)
VLDL: 7 mg/dL (ref 0–40)

## 2016-03-21 LAB — BASIC METABOLIC PANEL
ANION GAP: 7 (ref 5–15)
BUN: 10 mg/dL (ref 6–20)
CO2: 24 mmol/L (ref 22–32)
Calcium: 8.6 mg/dL — ABNORMAL LOW (ref 8.9–10.3)
Chloride: 102 mmol/L (ref 101–111)
Creatinine, Ser: 0.61 mg/dL (ref 0.44–1.00)
GLUCOSE: 130 mg/dL — AB (ref 65–99)
POTASSIUM: 4.1 mmol/L (ref 3.5–5.1)
Sodium: 133 mmol/L — ABNORMAL LOW (ref 135–145)

## 2016-03-21 LAB — GLUCOSE, CAPILLARY
GLUCOSE-CAPILLARY: 133 mg/dL — AB (ref 65–99)
Glucose-Capillary: 130 mg/dL — ABNORMAL HIGH (ref 65–99)
Glucose-Capillary: 116 mg/dL — ABNORMAL HIGH (ref 65–99)

## 2016-03-21 LAB — CBC
HEMATOCRIT: 35.1 % — AB (ref 36.0–46.0)
HEMOGLOBIN: 11.8 g/dL — AB (ref 12.0–15.0)
MCH: 30.2 pg (ref 26.0–34.0)
MCHC: 33.6 g/dL (ref 30.0–36.0)
MCV: 89.8 fL (ref 78.0–100.0)
Platelets: 269 10*3/uL (ref 150–400)
RBC: 3.91 MIL/uL (ref 3.87–5.11)
RDW: 12.9 % (ref 11.5–15.5)
WBC: 6.9 10*3/uL (ref 4.0–10.5)

## 2016-03-21 MED ORDER — IOPAMIDOL (ISOVUE-370) INJECTION 76%
INTRAVENOUS | Status: AC
  Filled 2016-03-21: qty 100

## 2016-03-21 MED ORDER — IOPAMIDOL (ISOVUE-370) INJECTION 76%
INTRAVENOUS | Status: AC
  Administered 2016-03-21: 50 mL
  Filled 2016-03-21: qty 50

## 2016-03-21 MED ORDER — FAMOTIDINE 20 MG PO TABS
20.0000 mg | ORAL_TABLET | Freq: Two times a day (BID) | ORAL | Status: DC
  Administered 2016-03-21 – 2016-03-24 (×5): 20 mg via ORAL
  Filled 2016-03-21 (×5): qty 1

## 2016-03-21 NOTE — Clinical Documentation Improvement (Signed)
Neurology  Based on the clinical findings below, please document any associated diagnoses/conditions the patient has or may have.   Cerebral Edema  Brain Herniation  Other  Clinically Undetermined  Please update your documentation within the medical record to reflect your response to this query. Thank you   Supporting Information: CT head 03/20/16 IMPRESSION: 1. Acute anterior right frontal lobe intra-axial hemorrhage. Estimated hemorrhage volume 20 mL. Possible trace subdural extension at this time along the interhemispheric fissure. 2. Surrounding edema with mild regional mass effect. No intraventricular or other extra-axial extension.  Please exercise your independent, professional judgment when responding. A specific answer is not anticipated or expected.  Thank You, Alessandra Grout, RN, BSN, CCDS,Clinical Documentation Specialist:  [phone]  [phone]=[facility] [place]- Health Information Management

## 2016-03-21 NOTE — Progress Notes (Signed)
Rehab Admissions Coordinator Note:  Patient was screened by Cleatrice Burke for appropriateness for an Inpatient Acute Rehab Consult per PT recommendation.   At this time, we are recommending Inpatient Rehab consult.  Cleatrice Burke 03/21/2016, 1:23 PM  I can be reached at [phone].

## 2016-03-21 NOTE — Care Management Note (Signed)
Case Management Note  Patient Details  Name: Cynthia [name] MRN: [id number] Date of Birth: [birth year]  Subjective/Objective:    Pt admitted on 03/20/16 with ICH.  PTA, pt independent, lives with spouse.                  Action/Plan: Will follow for discharge planning as pt progresses.  PT/OT consults pending for dc needs.    Expected Discharge Date:                  Expected Discharge Plan:  [place]  In-House Referral:     Discharge planning Services  CM Consult  Post Acute Care Choice:    Choice offered to:     DME Arranged:    DME Agency:     HH Arranged:    [place] Agency:     Status of Service:  In process, will continue to follow  If discussed at Long Length of Stay Meetings, dates discussed:    Additional Comments:  Reinaldo Raddle, RN, BSN  Trauma/Neuro ICU Case Manager [phone]

## 2016-03-21 NOTE — Consult Note (Signed)
SLP Cancellation Note  Patient Details Name: Corabell Goldade MRN: [id number] DOB: 1946/02/10   Cancelled treatment:        Unable to complete com/cog evaluation at this time, as pt is currently off unit for testing. Will continue efforts.  Shonna Chock 03/21/2016, 2:44 PM  Denzel Etienne B. [place], [place], [place]

## 2016-03-21 NOTE — Consult Note (Signed)
Physical Medicine and Rehabilitation Consult Reason for Consult: Right frontal ICH Referring Physician: Dr. Leonie Man   HPI: Cynthia [name] is a 70 y.o. right handed female admitted 03/20/2016 with headache, left-sided weakness, and altered mental status and aphasia. There was a report of a fall from a first step of a ladder several days ago. Patient lives with husband in [place]. Independent prior to admission. 2 level home with bedroom on first floor. Husband can assist as needed. CT of the head showed acute anterior right frontal lobe intra-axial hemorrhage estimated hemorrhage volume 20 mL. Surrounding edema with mild regional mass effect. MRI of the brain showed late acute early subacute intraparenchymal hemorrhage in the right posterior frontal lobe with layering and surrounding edema. CTA of head and neck shows right frontal lobe bilobed hematoma spanning 3.2 x 4.3 x 3.7 cm with surrounding vasogenic edema and mass effect upon the right ventricle appearing similar to recent MRI CT. CTA of neck no significant stenosis of either vertebral artery. Neurology service is consulted. CT angiogram of head and neck are pending. Workup currently ongoing. Maintain on a regular diet. Physical therapy evaluation completed 03/21/2016 with recommendations of physical medicine rehabilitation consult.   Review of Systems  Constitutional: Negative for fever and chills.  HENT: Negative for hearing loss.   Eyes: Negative for blurred vision and double vision.  Respiratory: Negative for cough and shortness of breath.   Cardiovascular: Negative for chest pain, palpitations and leg swelling.  Gastrointestinal: Positive for constipation. Negative for nausea and vomiting.  Genitourinary: Negative for dysuria and hematuria.  Musculoskeletal: Positive for myalgias.  Neurological: Positive for speech change, weakness and headaches. Negative for seizures.  Psychiatric/Behavioral: Positive for  depression.  All other systems reviewed and are negative.  Past Medical History  Diagnosis Date  . Colon polyps   . Osteoporosis   . Depression     history of   History reviewed. No pertinent past surgical history. Family History  Problem Relation Age of Onset  . Heart disease Mother   . Cancer Mother     lung   Social History:  reports that she has never smoked. She has never used smokeless tobacco. She reports that she does not drink alcohol or use illicit drugs. Allergies:  Allergies  Allergen Reactions  . Other Anaphylaxis    FIRE ANTS  . Penicillins Rash    Has patient had a PCN reaction causing immediate rash, facial/tongue/throat swelling, SOB or lightheadedness with hypotension: Unknown Has patient had a PCN reaction causing severe rash involving mucus membranes or skin necrosis: No Has patient had a PCN reaction that required hospitalization No Has patient had a PCN reaction occurring within the last 10 years: No If all of the above answers are "NO", then may proceed with Cephalosporin use.    Medications Prior to Admission  Medication Sig Dispense Refill  . ALPRAZolam (XANAX) 0.25 MG tablet TAKE 1 TABLET 3 TIMES DAILY AS NEEDED FOR ANXIETY (Patient taking differently: TAKE 1 TABLET 2 TIMES DAILY AS NEEDED FOR ANXIETY) 90 tablet 0  . Calcium Carbonate-Vitamin D (OS-CAL 500 + D PO) Take 1 tablet by mouth daily.     . meloxicam (MOBIC) 15 MG tablet TAKE 1 TABLET BY MOUTH EVERY DAY (Patient taking differently: TAKE 1 TABLET (15 mg) BY MOUTH EVERY DAY as needed for lower back pain) 30 tablet 2  . cetirizine (ZYRTEC) 10 MG tablet Take 10 mg by mouth daily as needed for allergies.     Marland Kitchen  EPIPEN 2-PAK 0.3 MG/0.3ML SOAJ injection INJECT 0.3 MLS (0.3 MG TOTAL) INTO THE MUSCLE ONCE. 2 Device 0  . ferrous sulfate 325 (65 FE) MG tablet Take 325 mg by mouth daily as needed (to improve hemoglobin).       Home: Home Living Family/patient expects to be discharged to:: Private  residence Living Arrangements: Spouse/significant other Available Help at Discharge: Family Type of Home: House Home Access: Stairs to enter Technical brewer of Steps: 4 Entrance Stairs-Rails: None Home Layout: Able to live on main level with bedroom/bathroom Bathroom Shower/Tub: Chiropodist: Standard Home Equipment: None  Functional History: Prior Function Level of Independence: Independent Comments: was out at the [place] this past weekend before event ocurred Functional Status:  Mobility: Bed Mobility Overal bed mobility: Needs Assistance Bed Mobility: Supine to Sit Supine to sit: Mod assist General bed mobility comments: max multi-modal cues to direct to task, patient required sequencing break down of task. cues to initiate LE movement to EOB, assist for LLE, cues for LUE to reach for rail, assist to elevate trunk to upright with HOB elevated and use of chuck pad and assist to rotate hips to EOB Transfers Overall transfer level: Needs assistance Equipment used: 2 person hand held assist Transfers: Sit to/from Stand, Stand Pivot Transfers Sit to Stand: Min assist, +2 physical assistance Stand pivot transfers: Mod assist, +2 physical assistance General transfer comment: Moderate assist to perform pivotal steps to chair, manual faciliatation of weight shift to the right to allow LLE to elevate and place. Cues for direction of specific task.  Ambulation/Gait General Gait Details: did not perform due to patient with nausea and vomitting during session.     ADL:    Cognition: Cognition Overall Cognitive Status: Impaired/Different from baseline Orientation Level: Oriented X4 Cognition Arousal/Alertness: Awake/alert Behavior During Therapy: Flat affect Overall Cognitive Status: Impaired/Different from baseline Area of Impairment: Attention, Following commands, Safety/judgement, Awareness, Problem solving Current Attention Level: Focused Following  Commands: Follows one step commands with increased time Safety/Judgement: Decreased awareness of deficits Awareness: Emergent Problem Solving: Slow processing, Decreased initiation, Difficulty sequencing, Requires verbal cues, Requires tactile cues General Comments: Patient with difficulty attending to left side but does repond to repeated cues, patient with limited focus during tasks requiring continuous cues to complete tasks.   Blood pressure 150/73, pulse 79, temperature 98.8 F (37.1 C), temperature source Oral, resp. rate 14, height 5\' 6"  (1.676 m), weight 69.6 kg (153 lb 7 oz), SpO2 98 %. Physical Exam  Vitals reviewed. Constitutional: She is oriented to person, place, and time. She appears well-developed and well-nourished.  HENT:  Head: Normocephalic and atraumatic.  Eyes:  Pupils reactive to light Right gaze preference  Neck: Normal range of motion. Neck supple. No thyromegaly present.  Cardiovascular: Normal rate and regular rhythm.   Respiratory: Effort normal and breath sounds normal. No respiratory distress.  GI: Soft. Bowel sounds are normal. She exhibits no distension.  Musculoskeletal: She exhibits no edema or tenderness.  Neurological: She is alert and oriented to person, place, and time.  Mood is flat.  Followed simple commands. Right gaze preferenceSensation intact to light touch DTRs symmetric Motor: RUE: 5/5 proximal to distal RLE: 4+/5 proximal to distal LUE: 0/5 LLE: 0/5 (however, brother states pt was moving)  Skin: Skin is warm and dry.  Psychiatric:  Altered mentation    Results for orders placed or performed during the hospital encounter of 03/20/16 (from the past 24 hour(s))  Urine rapid drug screen (hosp performed)not at  [facility]     Status: None   Collection Time: 03/20/16  1:31 PM  Result Value Ref Range   Opiates NONE DETECTED NONE DETECTED   Cocaine NONE DETECTED NONE DETECTED   Benzodiazepines NONE DETECTED NONE DETECTED   Amphetamines NONE  DETECTED NONE DETECTED   Tetrahydrocannabinol NONE DETECTED NONE DETECTED   Barbiturates NONE DETECTED NONE DETECTED  Urinalysis, Routine w reflex microscopic (not at [facility])     Status: None   Collection Time: 03/20/16  1:31 PM  Result Value Ref Range   Color, Urine YELLOW YELLOW   APPearance CLEAR CLEAR   Specific Gravity, Urine 1.009 1.005 - 1.030   pH 7.5 5.0 - 8.0   Glucose, UA NEGATIVE NEGATIVE mg/dL   Hgb urine dipstick NEGATIVE NEGATIVE   Bilirubin Urine NEGATIVE NEGATIVE   Ketones, ur NEGATIVE NEGATIVE mg/dL   Protein, ur NEGATIVE NEGATIVE mg/dL   Nitrite NEGATIVE NEGATIVE   Leukocytes, UA NEGATIVE NEGATIVE  MRSA PCR Screening     Status: None   Collection Time: 03/20/16  6:57 PM  Result Value Ref Range   MRSA by PCR NEGATIVE NEGATIVE  Glucose, capillary     Status: Abnormal   Collection Time: 03/20/16  9:24 PM  Result Value Ref Range   Glucose-Capillary 148 (H) 65 - 99 mg/dL  Basic metabolic panel     Status: Abnormal   Collection Time: 03/21/16  5:54 AM  Result Value Ref Range   Sodium 133 (L) 135 - 145 mmol/L   Potassium 4.1 3.5 - 5.1 mmol/L   Chloride 102 101 - 111 mmol/L   CO2 24 22 - 32 mmol/L   Glucose, Bld 130 (H) 65 - 99 mg/dL   BUN 10 6 - 20 mg/dL   Creatinine, Ser 0.61 0.44 - 1.00 mg/dL   Calcium 8.6 (L) 8.9 - 10.3 mg/dL   GFR calc non Af Amer >60 >60 mL/min   GFR calc Af Amer >60 >60 mL/min   Anion gap 7 5 - 15  CBC     Status: Abnormal   Collection Time: 03/21/16  5:54 AM  Result Value Ref Range   WBC 6.9 4.0 - 10.5 K/uL   RBC 3.91 3.87 - 5.11 MIL/uL   Hemoglobin 11.8 (L) 12.0 - 15.0 g/dL   HCT 35.1 (L) 36.0 - 46.0 %   MCV 89.8 78.0 - 100.0 fL   MCH 30.2 26.0 - 34.0 pg   MCHC 33.6 30.0 - 36.0 g/dL   RDW 12.9 11.5 - 15.5 %   Platelets 269 150 - 400 K/uL  Lipid panel     Status: None   Collection Time: 03/21/16  5:54 AM  Result Value Ref Range   Cholesterol 181 0 - 200 mg/dL   Triglycerides 37 <150 mg/dL   HDL 90 >40 mg/dL   Total CHOL/HDL  Ratio 2.0 RATIO   VLDL 7 0 - 40 mg/dL   LDL Cholesterol 84 0 - 99 mg/dL  Glucose, capillary     Status: Abnormal   Collection Time: 03/21/16  8:11 AM  Result Value Ref Range   Glucose-Capillary 133 (H) 65 - 99 mg/dL  Glucose, capillary     Status: Abnormal   Collection Time: 03/21/16 12:13 PM  Result Value Ref Range   Glucose-Capillary 130 (H) 65 - 99 mg/dL   Comment 1 Notify RN    Comment 2 Document in Chart    Ct Head Wo Contrast  03/20/2016  ADDENDUM REPORT: 03/20/2016 14:15 ADDENDUM: Study discussed by telephone with  Dr. Andee Poles RAY on 03/20/2016 at 14:14 . At 1407 hours. Electronically Signed   By: Genevie Ann M.D.   On: 03/20/2016 14:15  03/20/2016  CLINICAL DATA:  70 year old female with sudden onset left side weakness last night at 2000 hours. Headache. No known trauma. Initial encounter. EXAM: CT HEAD WITHOUT CONTRAST TECHNIQUE: Contiguous axial images were obtained from the base of the skull through the vertex without intravenous contrast. COMPARISON:  None. FINDINGS: No osseous abnormality identified. Trace fluid in the left sphenoid sinus. Other Visualized paranasal sinuses and mastoids are well pneumatized. Negative visualized orbit and scalp soft tissues. Mild Calcified atherosclerosis at the skull base. Intra-axial parenchymal hemorrhage in the anterior right frontal lobe is somewhat flame shaped and encompasses 30 x 34 x 39 mL (AP by transverse by CC) foreign estimated hemorrhage volume of 20 mL. The hemorrhage extends to the interhemispheric fissure, and it is difficult to exclude a trace subdural component along the falx at this time (series 21, image 19). No intraventricular extension. No other extra-axial hemorrhage identified. Surrounding hypodense edema with mild regional mass effect. 1-2 mm of focal leftward midline shift. Mild mass effect on the right frontal horn. Basilar cisterns remain patent. No ventriculomegaly. Patchy bilateral cerebral white matter hypodensity elsewhere. No  other acute cortically based infarct. No suspicious intracranial vascular hyperdensity. IMPRESSION: 1. Acute anterior right frontal lobe intra-axial hemorrhage. Estimated hemorrhage volume 20 mL. Possible trace subdural extension at this time along the interhemispheric fissure. 2. Surrounding edema with mild regional mass effect. No intraventricular or other extra-axial extension. 3. Underlying moderate nonspecific cerebral white matter signal changes. 4. Top differential considerations include hypertensive/small vessel related hemorrhage, sequelae of acute coagulopathy, amyloid angiopathy, and less likely vascular malformation or tumor related. Electronically Signed: By: Genevie Ann M.D. On: 03/20/2016 14:02   Mr Brain Wo Contrast  03/20/2016  CLINICAL DATA:  Followup intracranial hemorrhage. Recently fell from ladder but does not described striking the head. EXAM: MRI HEAD WITHOUT CONTRAST TECHNIQUE: Multiplanar, multiecho pulse sequences of the brain and surrounding structures were obtained without intravenous contrast. COMPARISON:  Head CT same day FINDINGS: The brain shows a background pattern of moderate chronic small-vessel ischemic change throughout the cerebral hemispheric deep and subcortical white matter. Mild chronic small-vessel changes present in the pons. Within the right posterior frontal region, there is late acute/ early subacute intraparenchymal hemorrhage with a bilobed hematoma measuring in total 3.2 cm right to left, 4.3 cm front to back and 3.7 cm cephalo caudal. I think this is unchanged in size, some of the apparent larger AP dimension at MRI due to the fact that MRI is able to see the isodense components. Surrounding vasogenic edema, also consistent with the late acute/ early subacute timeframe. There is mild mass-effect for there is no midline shift. No threatening herniation. No finding by MRI of that would suggest an underlying mass lesion or high-flow vascular abnormality. There are not  other foci of blood products in the brain, arguing against chronic amyloid disease. No hydrocephalus. No extra-axial collection or blood component. Layering fluid in the right maxillary sinus consistent with right maxillary sinusitis. Small mastoid effusion on the right. No pituitary mass. Major vessels at the base of the brain show flow. IMPRESSION: Late acute/ early subacute intraparenchymal hemorrhage in the right posterior frontal lobe with layering and surrounding edema. I would estimate that this hemorrhage is between 30 and 76-days-old. I do not believe there has been any additional bleeding since the CT. Larger measurements in the front  to back dimension relate to the ability of the MRI to characterize isodense components of the hematoma. No sign of underlying tumor or high-flow vascular malformation. No other foci of blood products in the brain, arguing against amyloid angiopathy. Electronically Signed   By: Nelson Chimes M.D.   On: 03/20/2016 17:56    Assessment/Plan: Diagnosis: TBI with Right frontal ICH Labs and images independently reviewed.  Records reviewed and summated above.  [place] score:  ?V-VI  Speech to evaluate for Post traumatic amnesia and interval GOAT scores to assess progress.  NeuroPsych evaluation for behavorial assessment.  Provide environmental management by reducing the level of stimulation, tolerating restlessness when possible, protecting patient from harming self or others and reducing patient's cognitive confusion.  Address behavioral concerns include providing structured environments and daily routines.  Cognitive therapy to direct modular abilities in order to maintain goals  including problem solving, self regulation/monitoring, self management, attention, and memory.  Fall precautions; pt at risk for second impact syndrome  Prevention of secondary injury: monitor for hypotension, hypoxia, seizures or signs of increased ICP  Prophylactic AED:   Consider  pharmacological intervention if necessary with neurostimulants, such as amantadine, methylphenidate, modafinil, etc.  Consider Propranolol for agitation and storming  Avoid medications that could impair cognitive abilities, such as anticholinergics, antihistaminic, benzodiazapines, narcotics, etc when possible  1. Does the need for close, 24 hr/day medical supervision in concert with the patient's rehab needs make it unreasonable for this patient to be served in a less intensive setting? Yes  2. Co-Morbidities requiring supervision/potential complications: headache (ensure headache does not limit functional progress), HTN (monitor and provide prns in accordance with increased physical exertion and pain), hyponatremia (cont to monitor, treat if necessary), ABLA (transfuse if necessary to ensure appropriate perfusion for increased activity tolerance) 3. Due to bladder management, safety, skin/wound care, disease management, pain management and patient education, does the patient require 24 hr/day rehab nursing? Yes 4. Does the patient require coordinated care of a physician, rehab nurse, PT (1-2 hrs/day, 5 days/week), OT (1-2 hrs/day, 5 days/week) and SLP (1-2 hrs/day, 5 days/week) to address physical and functional deficits in the context of the above medical diagnosis(es)? Yes Addressing deficits in the following areas: balance, endurance, locomotion, strength, transferring, bowel/bladder control, bathing, dressing, toileting, cognition, speech and psychosocial support 5. Can the patient actively participate in an intensive therapy program of at least 3 hrs of therapy per day at least 5 days per week? Potentially 6. The potential for patient to make measurable gains while on inpatient rehab is excellent 7. Anticipated functional outcomes upon discharge from inpatient rehab are supervision and min assist  with PT, supervision and min assist with OT, modified independent and supervision with  SLP. 8. Estimated rehab length of stay to reach the above functional goals is: 18-22 days. 9. Does the patient have adequate social supports and living environment to accommodate these discharge functional goals? Yes 10. Anticipated D/C setting: Home 11. Anticipated post D/C treatments: HH therapy and Home excercise program 12. Overall Rehab/Functional Prognosis: good  RECOMMENDATIONS: This patient's condition is appropriate for continued rehabilitative care in the following setting: CIR when medical workup complete and appropriate  Patient has agreed to participate in recommended program. Potentially Note that insurance prior authorization may be required for reimbursement for recommended care.  Comment: Rehab Admissions Coordinator to follow up.  Delice Lesch, MD 03/21/2016

## 2016-03-21 NOTE — Evaluation (Signed)
Physical Therapy Evaluation Patient Details Name: Cynthia [name] MRN: [id number] DOB: 1946-07-29 Today's Date: 03/21/2016   History of Present Illness  70 y.o. female with history of depression, colon polyps and osteoporosis presenting with expressive aphaisa and confusion and left side mild hemiplegia, CT shows R frontal ICH.  Clinical Impression  Patient demonstrates deficits in functional mobility as indicated below. Will need continued skilled PT to address deficits and maximize function. Will see as indicated and progress as tolerated. Patient currently with deficits in cognition as well as physical deficits impairing her ability to perform functional mobility without increased assist. At this time, feel patient would benefit from comprehensive therapies upon acute discharge. Recommend CIR consult as patient was independent and active prior to admission.    Follow Up Recommendations CIR;Supervision/Assistance - 24 hour    Equipment Recommendations  Other (comment) (TBD)    Recommendations for Other Services       Precautions / Restrictions Precautions Precautions: Fall Precaution Comments: left inattention Restrictions Weight Bearing Restrictions: No      Mobility  Bed Mobility Overal bed mobility: Needs Assistance Bed Mobility: Supine to Sit     Supine to sit: Mod assist     General bed mobility comments: max multi-modal cues to direct to task, patient required sequencing break down of task. cues to initiate LE movement to EOB, assist for LLE, cues for LUE to reach for rail, assist to elevate trunk to upright with HOB elevated and use of chuck pad and assist to rotate hips to EOB  Transfers Overall transfer level: Needs assistance Equipment used: 2 person hand held assist Transfers: Sit to/from [facility] Sit to Stand: Min assist;+2 physical assistance Stand pivot transfers: Mod assist;+2 physical assistance       General transfer comment:  Moderate assist to perform pivotal steps to chair, manual faciliatation of weight shift to the right to allow LLE to elevate and place. Cues for direction of specific task.   Ambulation/Gait             General Gait Details: did not perform due to patient with nausea and vomitting during session.   Stairs            Wheelchair Mobility    Modified Rankin (Stroke Patients Only) Modified Rankin (Stroke Patients Only) Pre-Morbid Rankin Score: No symptoms Modified Rankin: Moderately severe disability     Balance Overall balance assessment: Needs assistance   Sitting balance-Leahy Scale: Poor Sitting balance - Comments: able to sit EOB but lists to the right, multimodal cues to come to midline Postural control: Right lateral lean Standing balance support: Bilateral upper extremity supported Standing balance-Leahy Scale: Poor Standing balance comment: patient required hands on bilateral assist to maintain upright                             Pertinent Vitals/Pain Pain Assessment: 0-10 Pain Score: 6  Pain Location: head Pain Descriptors / Indicators: Headache Pain Intervention(s): Monitored during session    Home Living Family/patient expects to be discharged to:: Private residence Living Arrangements: Spouse/significant other Available Help at Discharge: Family Type of Home: House Home Access: Stairs to enter Entrance Stairs-Rails: None Entrance Stairs-Number of Steps: 4 Home Layout: Able to live on main level with bedroom/bathroom Home Equipment: None      Prior Function Level of Independence: Independent         [place] event ocurred  Hand Dominance   Dominant Hand: Right    Extremity/Trunk Assessment   Upper Extremity Assessment: LUE deficits/detail       LUE Deficits / Details: left sided weakness noted with patient maintain some flexed positioning   Lower Extremity Assessment:  LLE deficits/detail   LLE Deficits / Details: noted LLE gross weakness 3/5 strength also impacted by attention.      Communication   Communication: Expressive difficulties  Cognition Arousal/Alertness: Awake/alert Behavior During Therapy: Flat affect Overall Cognitive Status: Impaired/Different from baseline Area of Impairment: Attention;Following commands;Safety/judgement;Awareness;Problem solving   Current Attention Level: Focused   Following Commands: Follows one step commands with increased time Safety/Judgement: Decreased awareness of deficits Awareness: Emergent Problem Solving: Slow processing;Decreased initiation;Difficulty sequencing;Requires verbal cues;Requires tactile cues General Comments: Patient with difficulty attending to left side but does repond to repeated cues, patient with limited focus during tasks requiring continuous cues to complete tasks.     General Comments General comments (skin integrity, edema, etc.): patient with nausea and vomitting during session, hygiene performed, nursing notified.  Educated spouse on therapeutic activities for left inattention, spoke with patient and spouse regarding positioning and recommendations for follow up    Exercises        Assessment/Plan    PT Assessment Patient needs continued PT services  PT Diagnosis Difficulty walking;Abnormality of gait;Acute pain;Hemiplegia non-dominant side;Altered mental status   PT Problem List Decreased strength;Decreased activity tolerance;Decreased balance;Decreased mobility;Decreased coordination;Decreased cognition;Pain  PT Treatment Interventions DME instruction;Gait training;Stair training;Functional mobility training;Therapeutic activities;Therapeutic exercise;Balance training;Patient/family education   PT Goals (Current goals can be found in the Care Plan section) Acute Rehab PT Goals Patient Stated Goal: to go home PT Goal Formulation: With patient/family Time For Goal  Achievement: 04/04/16 Potential to Achieve Goals: Good    Frequency Min 4X/week   Barriers to discharge        Co-evaluation               End of Session Equipment Utilized During Treatment: Gait belt Activity Tolerance: Other (comment) (limited by nausea and vomitting during session) Patient left: in chair;with call bell/phone within reach;with chair alarm set;with family/visitor present Nurse Communication: Mobility status;Other (comment) (actively vomitting)         Time: [id number] PT Time Calculation (min) (ACUTE ONLY): 27 min   Charges:   PT Evaluation $PT Eval Moderate Complexity: 1 Procedure PT Treatments $Therapeutic Activity: 8-22 mins   PT G CodesDuncan Dull April 07, 2016, 12:57 PM  Alben Deeds, [place] DPT  [phone]

## 2016-03-21 NOTE — Progress Notes (Signed)
STROKE TEAM PROGRESS NOTE   HISTORY OF PRESENT ILLNESS (per record) This is a 70 year old right-handed woman who presents to the [facility] Emergency department after being sent from her doctor's office due to concern for possible stroke. History is obtained directly from the patient who is a good historian. She is accompanied by family members to offer additional information as needed.  The patient reports that last night 03/19/2016 (LKW, time unknown) she developed a sense of feeling disoriented and having difficulty speaking. When asked about her speech, she describes that she has trouble focusing her thoughts and getting her words out. She reports that her sister also noticed that she seemed slow to respond to questions. She feels as if her speech is somewhat slurred as well. She does not endorse any weakness, numbness, vision changes, difficulty swallowing, or balance impairments. However, she has noticed that she will find her left arm in unusual postures and positions without being aware that it is doing anything. When she looks at it, she is able to control it without a problem and again denies any weakness or sensory changes. She has also had a headache, though it sounds as if this may have been present for the past 2 days. She currently reports her headache is improved. She denies any similar previous episodes.  She denies any recent trauma to the head. She does note that she was working with her brother at her home and had been up on a ladder. She was getting off the ladder, she thought she was on the ground but instead was on the second step of the latter when she stumbled backwards. She says that she struck her bottom on a nearby chair, then fell to the floor landing on her bottom. She did not strike her head. She has not had any recent illness. She does not use daily antiplatelet therapy or anticoagulation. She uses occasional Advil for headache and pain and has taken a total of six 200 mg tablets  since last night. She also reports that she took a baby aspirin earlier today as recommended by her sister because of her symptoms.  CT shows a large right frontal ICH. Patient was not administered IV t-PA secondary to [place]. She was admitted to the neuro ICU for further evaluation and treatment.   SUBJECTIVE (INTERVAL HISTORY) Her family is at the bedside.  Overall she feels her condition is gradually improving. She feels her speech is better but not back to baseline. She reports she [place] the [place] to [place] her sister on Saturday and they went [place] the river - there over 2 hours, not very hot. She says she stayed hydrated. She noted speech difficulty later that night. No brain history. Does report recent HAs. MRI scan of the brain shows a right frontal lobar hemorrhage which is subacute  And no underlying structural or vascular lesions noted   OBJECTIVE Temp:  [97.3 F (36.3 C)-98.3 F (36.8 C)] 97.4 F (36.3 C) (07/10 0800) Pulse Rate:  [62-109] 78 (07/10 0900) Cardiac Rhythm:  [-] Normal sinus rhythm (07/10 0800) Resp:  [12-26] 15 (07/10 0900) BP: (105-163)/(66-95) 149/74 mmHg (07/10 0900) SpO2:  [93 %-100 %] 98 % (07/10 0900) Weight:  [68.04 kg (150 lb)-69.6 kg (153 lb 7 oz)] 69.6 kg (153 lb 7 oz) (07/09 1810)  CBC:   Recent Labs Lab 03/20/16 1243 03/20/16 1250 03/21/16 0554  WBC 7.8  --  6.9  NEUTROABS 5.7  --   --   HGB 13.0  13.3 11.8*  HCT 38.7 39.0 35.1*  MCV 89.4  --  89.8  PLT 290  --  [id number]    Basic Metabolic Panel:   Recent Labs Lab 03/20/16 1243 03/20/16 1250 03/21/16 0554  NA 136 137 133*  K 3.8 3.8 4.1  CL 103 102 102  CO2 24  --  24  GLUCOSE 139* 139* 130*  BUN 11 12 10   CREATININE 0.69 0.70 0.61  CALCIUM 9.5  --  8.6*    Lipid Panel:     Component Value Date/Time   CHOL 181 03/21/2016 0554   TRIG 37 03/21/2016 0554   HDL 90 03/21/2016 0554   CHOLHDL 2.0 03/21/2016 0554   VLDL 7 03/21/2016 0554   LDLCALC 84 03/21/2016 0554    HgbA1c: No results found for: HGBA1C Urine Drug Screen:     Component Value Date/Time   LABOPIA NONE DETECTED 03/20/2016 1331   COCAINSCRNUR NONE DETECTED 03/20/2016 1331   LABBENZ NONE DETECTED 03/20/2016 1331   AMPHETMU NONE DETECTED 03/20/2016 1331   THCU NONE DETECTED 03/20/2016 1331   LABBARB NONE DETECTED 03/20/2016 1331      IMAGING  Ct Head Wo Contrast  03/20/2016  ADDENDUM REPORT: 03/20/2016 14:15 ADDENDUM: Study discussed by telephone with Dr. Andee Poles RAY on 03/20/2016 at 14:14 . At 1407 hours. Electronically Signed   By: Genevie Ann M.D.   On: 03/20/2016 14:15  03/20/2016  CLINICAL DATA:  70 year old female with sudden onset left side weakness last night at 2000 hours. Headache. No known trauma. Initial encounter. EXAM: CT HEAD WITHOUT CONTRAST TECHNIQUE: Contiguous axial images were obtained from the base of the skull through the vertex without intravenous contrast. COMPARISON:  None. FINDINGS: No osseous abnormality identified. Trace fluid in the left sphenoid sinus. Other Visualized paranasal sinuses and mastoids are well pneumatized. Negative visualized orbit and scalp soft tissues. Mild Calcified atherosclerosis at the skull base. Intra-axial parenchymal hemorrhage in the anterior right frontal lobe is somewhat flame shaped and encompasses 30 x 34 x 39 mL (AP by transverse by CC) foreign estimated hemorrhage volume of 20 mL. The hemorrhage extends to the interhemispheric fissure, and it is difficult to exclude a trace subdural component along the falx at this time (series 21, image 19). No intraventricular extension. No other extra-axial hemorrhage identified. Surrounding hypodense edema with mild regional mass effect. 1-2 mm of focal leftward midline shift. Mild mass effect on the right frontal horn. Basilar cisterns remain patent. No ventriculomegaly. Patchy bilateral cerebral white matter hypodensity elsewhere. No other acute cortically based infarct. No suspicious intracranial  vascular hyperdensity. IMPRESSION: 1. Acute anterior right frontal lobe intra-axial hemorrhage. Estimated hemorrhage volume 20 mL. Possible trace subdural extension at this time along the interhemispheric fissure. 2. Surrounding edema with mild regional mass effect. No intraventricular or other extra-axial extension. 3. Underlying moderate nonspecific cerebral white matter signal changes. 4. Top differential considerations include hypertensive/small vessel related hemorrhage, sequelae of acute coagulopathy, amyloid angiopathy, and less likely vascular malformation or tumor related. Electronically Signed: By: Genevie Ann M.D. On: 03/20/2016 14:02   Mr Brain Wo Contrast  03/20/2016  CLINICAL DATA:  Followup intracranial hemorrhage. Recently fell from ladder but does not described striking the head. EXAM: MRI HEAD WITHOUT CONTRAST TECHNIQUE: Multiplanar, multiecho pulse sequences of the brain and surrounding structures were obtained without intravenous contrast. COMPARISON:  Head CT same day FINDINGS: The brain shows a background pattern of moderate chronic small-vessel ischemic change throughout the cerebral hemispheric deep and subcortical white matter. Mild chronic  small-vessel changes present in the pons. Within the right posterior frontal region, there is late acute/ early subacute intraparenchymal hemorrhage with a bilobed hematoma measuring in total 3.2 cm right to left, 4.3 cm front to back and 3.7 cm cephalo caudal. I think this is unchanged in size, some of the apparent larger AP dimension at MRI due to the fact that MRI is able to see the isodense components. Surrounding vasogenic edema, also consistent with the late acute/ early subacute timeframe. There is mild mass-effect for there is no midline shift. No threatening herniation. No finding by MRI of that would suggest an underlying mass lesion or high-flow vascular abnormality. There are not other foci of blood products in the brain, arguing against chronic  amyloid disease. No hydrocephalus. No extra-axial collection or blood component. Layering fluid in the right maxillary sinus consistent with right maxillary sinusitis. Small mastoid effusion on the right. No pituitary mass. Major vessels at the base of the brain show flow. IMPRESSION: Late acute/ early subacute intraparenchymal hemorrhage in the right posterior frontal lobe with layering and surrounding edema. I would estimate that this hemorrhage is between 64 and 77-days-old. I do not believe there has been any additional bleeding since the CT. Larger measurements in the front to back dimension relate to the ability of the MRI to characterize isodense components of the hematoma. No sign of underlying tumor or high-flow vascular malformation. No other foci of blood products in the brain, arguing against amyloid angiopathy. Electronically Signed   By: Nelson Chimes M.D.   On: 03/20/2016 17:56       PHYSICAL EXAM  pleasant middle-age Caucasian lady currently not in distress. . Afebrile. Head is nontraumatic. Neck is supple without bruit.    Cardiac exam no murmur or gallop. Lungs are clear to auscultation. Distal pulses are well  felt Neurological Exam :  Awake alert slightly slow to respond to questions and process information but no definite aphasia. Oriented to time place and person. Follows one and two-step commands. No right left confusion. Vision acuity since adequate. Fundi were not visualized. Vision acuity and fields seem normal. Extraocular moments are full range without nystagmus. Mild left lower facial weakness. Tongue midline. Motor system exam mild left upper and lower extremity drift. Mild weakness of left grip and intrinsic hand muscles. Orbits right over left upper extremity. 4/5 left upper and lower extremity strength. Coordination slightly slow and impaired on the left compared to the right. Touch and pinprick sensation are preserved. Position sense possibly slightly impaired on the left but  inconsistent. Deep tendon pulses are symmetric. Left plantar is equivocal right is downgoing. Gait was not tested. .ASSESSMENT/PLAN Ms. Maliyani Steinhaus is a 70 y.o. female with history of depression, colon polyps and osteoporosis presenting with expressive aphaisa and confusion. CT shows R frontal ICH.  Stroke:  right frontal lobe ICH secondary to unknown source. Location not typical for a hypertensive etiology. Doubt amyloid given lack of other signs of hemorrhage on imaging. No underlying tumor seen on MRI. Concern for AVM vs aneurysm given family history.   Resultant  Expressive aphasia, mild L hemiparesis  CT R frontal lobe ICH, 20 mL, possible trace SDH  MRI  R posterior frontal lobe ICH, 4-22 days old. No other blood products seen.  CTA head and neck  ordered  CT venogram ordered  LDL 84  HgbA1c ordered  SCDs for VTE prophylaxis Diet regular Room service appropriate?: Yes; Fluid consistency:: Thin  No antithrombotic prior to admission  Ongoing  aggressive stroke risk factor management  Therapy recommendations:  Pending. Ok to be OOB.  Disposition:  pending   Headache  Secondary to [place]  Controlled with 1 dose tylenol and morphine  Hypertension  BP 163/95 on arrival in setting of neurologic symptoms  Stable this am at 120/72  No hx of HTN. On no home medications  Doubt hypertensive cause of ICH  Hyperglycemia  HgbA1c pending , goal < 7.0  Other Stroke Risk Factors  Advanced age  ETOH use, advised to drink no more than 1 drink(s) a day  Family hx stroke (maternal aunt w/ hemorrhage, paternal grandmother)  Other Active Problems  Hx depression  Routinely gives blood  Hospital day # [place] for Pager information 03/21/2016 9:37 AM  I have personally examined this patient, reviewed notes, independently viewed imaging studies, participated in medical decision making and plan of care. I have made any additions  or clarifications directly to the above note. Agree with note above. She presented with left hand incoordination and mild left hemiparesis and cognitive slowing secondary to large right frontal parenchymal hemorrhage anxiety etiology to be determined. She remains at risk for neurological worsening, hematoma expansion, cerebral edema and midline shift and needs close neurological monitoring and aggressive blood pressure control. Recommend check CT angiogram of the brain to look for aneurysm, AVM and CT venogram for venous sinus thrombosis. I had a long discussion with the patient, daughter and multiple family members at the bedside and answered questions This patient is critically ill and at significant risk of neurological worsening, death and care requires constant monitoring of vital signs, hemodynamics,respiratory and cardiac monitoring, extensive review of multiple databases, frequent neurological assessment, discussion with family, other specialists and medical decision making of high complexity.I have made any additions or clarifications directly to the above note.This critical care time does not reflect procedure time, or teaching time or supervisory time of PA/NP/Med Resident etc but could involve care discussion time.  I spent 40 minutes of neurocritical care time  in the care of  this patient.     Antony Contras, MD Medical Director [facility] Pager: [phone] 03/21/2016 1:15 PM    To contact Stroke Continuity provider, please refer to [email or web address] After hours, contact General Neurology

## 2016-03-21 NOTE — Progress Notes (Signed)
Physical medicine rehabilitation consult requested chart reviewed. Patient currently in for CT angiogram head and neck. Will await follow-up studies as well as ongoing therapies and follow-up with formal rehabilitation consult with appropriate recommendations

## 2016-03-21 NOTE — Care Management Important Message (Signed)
Important Message  Patient Details  Name: Cynthia [name] MRN: [id number] Date of Birth: 1945/11/23   Medicare Important Message Given:  Yes    Loann Quill 03/21/2016, 8:24 AM

## 2016-03-21 NOTE — Progress Notes (Signed)
OT Cancellation Note  Patient Details Name: Cynthia [name] MRN: [id number] DOB: 1946-07-03   Cancelled Treatment:    Reason Eval/Treat Not Completed: Patient not medically ready. Pt on bedrest currently.  Almon Register [id number] 03/21/2016, 8:09 AM

## 2016-03-22 ENCOUNTER — Inpatient Hospital Stay (HOSPITAL_COMMUNITY): Payer: Medicare Other

## 2016-03-22 DIAGNOSIS — S069X9D Unspecified intracranial injury with loss of consciousness of unspecified duration, subsequent encounter: Secondary | ICD-10-CM

## 2016-03-22 DIAGNOSIS — S06349D Traumatic hemorrhage of right cerebrum with loss of consciousness of unspecified duration, subsequent encounter: Secondary | ICD-10-CM

## 2016-03-22 DIAGNOSIS — G441 Vascular headache, not elsewhere classified: Secondary | ICD-10-CM | POA: Insufficient documentation

## 2016-03-22 DIAGNOSIS — S069X9A Unspecified intracranial injury with loss of consciousness of unspecified duration, initial encounter: Secondary | ICD-10-CM | POA: Insufficient documentation

## 2016-03-22 DIAGNOSIS — I10 Essential (primary) hypertension: Secondary | ICD-10-CM

## 2016-03-22 DIAGNOSIS — I611 Nontraumatic intracerebral hemorrhage in hemisphere, cortical: Principal | ICD-10-CM

## 2016-03-22 DIAGNOSIS — S069XAA Unspecified intracranial injury with loss of consciousness status unknown, initial encounter: Secondary | ICD-10-CM | POA: Insufficient documentation

## 2016-03-22 DIAGNOSIS — D62 Acute posthemorrhagic anemia: Secondary | ICD-10-CM

## 2016-03-22 DIAGNOSIS — E871 Hypo-osmolality and hyponatremia: Secondary | ICD-10-CM | POA: Insufficient documentation

## 2016-03-22 LAB — BASIC METABOLIC PANEL
ANION GAP: 7 (ref 5–15)
BUN: 7 mg/dL (ref 6–20)
CHLORIDE: 100 mmol/L — AB (ref 101–111)
CO2: 24 mmol/L (ref 22–32)
Calcium: 8.6 mg/dL — ABNORMAL LOW (ref 8.9–10.3)
Creatinine, Ser: 0.63 mg/dL (ref 0.44–1.00)
GFR calc non Af Amer: >60 mL/min (ref >60)
GLUCOSE: 107 mg/dL — AB (ref 65–99)
Potassium: 3.6 mmol/L (ref 3.5–5.1)
Sodium: 131 mmol/L — ABNORMAL LOW (ref 135–145)

## 2016-03-22 LAB — GLUCOSE, CAPILLARY
GLUCOSE-CAPILLARY: 113 mg/dL — AB (ref 65–99)
Glucose-Capillary: 107 mg/dL — ABNORMAL HIGH (ref 65–99)

## 2016-03-22 LAB — CBC
HEMATOCRIT: 35.1 % — AB (ref 36.0–46.0)
HEMOGLOBIN: 11.9 g/dL — AB (ref 12.0–15.0)
MCH: 30 pg (ref 26.0–34.0)
MCHC: 33.9 g/dL (ref 30.0–36.0)
MCV: 88.4 fL (ref 78.0–100.0)
Platelets: 253 10*3/uL (ref 150–400)
RBC: 3.97 MIL/uL (ref 3.87–5.11)
RDW: 12.5 % (ref 11.5–15.5)
WBC: 8.8 10*3/uL (ref 4.0–10.5)

## 2016-03-22 LAB — HEMOGLOBIN A1C
HEMOGLOBIN A1C: 5.3 % (ref 4.8–5.6)
Mean Plasma Glucose: 105 mg/dL

## 2016-03-22 MED ORDER — IOPAMIDOL (ISOVUE-300) INJECTION 61%
INTRAVENOUS | Status: AC
  Administered 2016-03-22: 84 mL
  Filled 2016-03-22: qty 150

## 2016-03-22 MED ORDER — FENTANYL CITRATE (PF) 100 MCG/2ML IJ SOLN
INTRAMUSCULAR | Status: AC
  Filled 2016-03-22: qty 2

## 2016-03-22 MED ORDER — MIDAZOLAM HCL 2 MG/2ML IJ SOLN
INTRAMUSCULAR | Status: AC
  Filled 2016-03-22: qty 2

## 2016-03-22 MED ORDER — FENTANYL CITRATE (PF) 100 MCG/2ML IJ SOLN
INTRAMUSCULAR | Status: AC | PRN
  Administered 2016-03-22: 25 ug via INTRAVENOUS

## 2016-03-22 MED ORDER — LIDOCAINE HCL 1 % IJ SOLN
INTRAMUSCULAR | Status: AC | PRN
  Administered 2016-03-22: 10 mg

## 2016-03-22 MED ORDER — SODIUM CHLORIDE 0.9 % IV SOLN
INTRAVENOUS | Status: AC
  Administered 2016-03-22: 12:00:00 via INTRAVENOUS

## 2016-03-22 MED ORDER — HEPARIN SODIUM (PORCINE) 1000 UNIT/ML IJ SOLN
INTRAMUSCULAR | Status: AC
  Filled 2016-03-22: qty 1

## 2016-03-22 MED ORDER — LIDOCAINE HCL 1 % IJ SOLN
INTRAMUSCULAR | Status: AC
  Filled 2016-03-22: qty 20

## 2016-03-22 NOTE — Sedation Documentation (Signed)
Patient is resting comfortably. 

## 2016-03-22 NOTE — Progress Notes (Addendum)
STROKE TEAM PROGRESS NOTE   HISTORY OF PRESENT ILLNESS (per record) This is a 70 year old right-handed woman who presents to the [facility] Emergency department after being sent from her doctor's office due to concern for possible stroke. History is obtained directly from the patient who is a good historian. She is accompanied by family members to offer additional information as needed.  The patient reports that last night 03/19/2016 (LKW, time unknown) she developed a sense of feeling disoriented and having difficulty speaking. When asked about her speech, she describes that she has trouble focusing her thoughts and getting her words out. She reports that her sister also noticed that she seemed slow to respond to questions. She feels as if her speech is somewhat slurred as well. She does not endorse any weakness, numbness, vision changes, difficulty swallowing, or balance impairments. However, she has noticed that she will find her left arm in unusual postures and positions without being aware that it is doing anything. When she looks at it, she is able to control it without a problem and again denies any weakness or sensory changes. She has also had a headache, though it sounds as if this may have been present for the past 2 days. She currently reports her headache is improved. She denies any similar previous episodes.  She denies any recent trauma to the head. She does note that she was working with her brother at her home and had been up on a ladder. She was getting off the ladder, she thought she was on the ground but instead was on the second step of the latter when she stumbled backwards. She says that she struck her bottom on a nearby chair, then fell to the floor landing on her bottom. She did not strike her head. She has not had any recent illness. She does not use daily antiplatelet therapy or anticoagulation. She uses occasional Advil for headache and pain and has taken a total of six 200 mg tablets  since last night. She also reports that she took a baby aspirin earlier today as recommended by her sister because of her symptoms.  CT shows a large right frontal ICH. Patient was not administered IV t-PA secondary to [place]. She was admitted to the neuro ICU for further evaluation and treatment.   SUBJECTIVE (INTERVAL HISTORY) Her brother is at the bedside.  Overall she feels her condition is gradually improving. She reports 8/10 headache and mild nausea but morphine and zofran have been helping.Scheduled for catheter angio today  OBJECTIVE Temp:  [98 F (36.7 C)-98.8 F (37.1 C)] 98.7 F (37.1 C) (07/11 1200) Pulse Rate:  [57-87] 65 (07/11 1200) Cardiac Rhythm:  [-] Normal sinus rhythm (07/11 1200) Resp:  [11-17] 16 (07/11 1200) BP: (121-157)/(60-82) 132/67 mmHg (07/11 1200) SpO2:  [90 %-99 %] 95 % (07/11 1200)  CBC:   Recent Labs Lab 03/20/16 1243  03/21/16 0554 03/22/16 0255  WBC 7.8  --  6.9 8.8  NEUTROABS 5.7  --   --   --   HGB 13.0  < > 11.8* 11.9*  HCT 38.7  < > 35.1* 35.1*  MCV 89.4  --  89.8 88.4  PLT 290  --  269 253  < > = values in this interval not displayed.  Basic Metabolic Panel:   Recent Labs Lab 03/21/16 0554 03/22/16 0255  NA 133* 131*  K 4.1 3.6  CL 102 100*  CO2 24 24  GLUCOSE 130* 107*  BUN 10 7  CREATININE  0.61 0.63  CALCIUM 8.6* 8.6*    Lipid Panel:     Component Value Date/Time   CHOL 181 03/21/2016 0554   TRIG 37 03/21/2016 0554   HDL 90 03/21/2016 0554   CHOLHDL 2.0 03/21/2016 0554   VLDL 7 03/21/2016 0554   LDLCALC 84 03/21/2016 0554   HgbA1c: No results found for: HGBA1C Urine Drug Screen:     Component Value Date/Time   LABOPIA NONE DETECTED 03/20/2016 1331   COCAINSCRNUR NONE DETECTED 03/20/2016 1331   LABBENZ NONE DETECTED 03/20/2016 1331   AMPHETMU NONE DETECTED 03/20/2016 1331   THCU NONE DETECTED 03/20/2016 1331   LABBARB NONE DETECTED 03/20/2016 1331      IMAGING  Ct Angio Head W Or Wo Contrast  03/21/2016   CLINICAL DATA:  70 year old female with intracranial hemorrhage. Left-sided weakness. Subsequent encounter. EXAM: CT ANGIOGRAPHY HEAD AND NECK CT VENOGRAM TECHNIQUE: Multidetector CT imaging of the head and neck was performed using the standard protocol during bolus administration of intravenous contrast. Multiplanar CT image reconstructions and MIPs were obtained to evaluate the vascular anatomy. Carotid stenosis measurements (when applicable) are obtained utilizing NASCET criteria, using the distal internal carotid diameter as the denominator. CONTRAST:  50 cc Isovue 370. COMPARISON:  03/20/2016 head CT and brain MR. FINDINGS: CT HEAD Brain: Right frontal lobe bilobed hematoma spanning over 3.2 x 4.3 x 3.7 cm with surrounding vasogenic edema and mass effect upon the right lateral ventricle (which is displaced inferiorly) appears similar to recent MR/CT. Calvarium and skull base: Negative for acute abnormality. Paranasal sinuses: Moderate air-fluid level right maxillary sinus. Small air-fluid level left sphenoid sinus. Orbits: No acute abnormality. CTA NECK Aortic arch: 3 vessel arch. Calcification and minimal bulge undersurface aortic arch. Right carotid system: Ectatic without significant stenosis. Left carotid system: Ectatic without significant stenosis. Vertebral arteries:No significant stenosis. Left vertebral artery is dominant. Skeleton: Reversal normal cervical lordosis with cervical spondylotic changes most prominent C3-4 thru C6-7. Prominent mandibular torus incidentally noted. Other neck: Right thyroid 2.6 cm lesion. Thyroid ultrasound recommended for further delineation. No worrisome lung apical lesion. CTA HEAD Anterior circulation: Anterior circulation without medium or large size vessel significant stenosis or occlusion. Fetal contribution to the posterior cerebral artery more notable on the right. Minimal calcified plaque cavernous segment right internal carotid artery with slight narrowing.  Vessels are displaced by right frontal lobe hematoma. Posterior to the right frontal lobe hematoma, slight prominence of vessels medial aspect of the junction of the right frontal and parietal lobe (series 504, image 135). Question crowding of vessels versus small vascular malformation involving distal right anterior cerebral artery. Posterior circulation: Left vertebral artery is dominant. Mild narrowing and irregularity basilar artery without high-grade stenosis. Venous sinuses: As below. Anatomic variants: As above. Delayed phase: As above. CT VENOGRAM OF THE HEAD: Major dural sinuses are patent. No obvious cortical vein thrombosis. IMPRESSION: CT HEAD Right frontal lobe bilobed hematoma spanning over 3.2 x 4.3 x 3.7 cm with surrounding vasogenic edema and mass effect upon the right lateral ventricle (which is displaced inferiorly) appears similar to recent MR/CT. Moderate air-fluid level right maxillary sinus. Small air-fluid level left sphenoid sinus. CTA NECK Ectatic carotid arteries without significant stenosis. No significant stenosis of either vertebral artery. Left vertebral artery is dominant. Reversal normal cervical lordosis with cervical spondylotic changes most prominent C3-4 thru C6-7. Right thyroid 2.6 cm lesion. Thyroid ultrasound recommended for further delineation. CTA HEAD Anterior circulation without medium or large size vessel significant stenosis or occlusion. Vessels are displaced  by right frontal lobe hematoma. Posterior to the right frontal lobe hematoma, slight prominence of vessels medial aspect of the junction of the right frontal and parietal lobe (series 504, image 135). Question crowding of vessels versus small vascular malformation involving distal right anterior cerebral artery. Left vertebral artery is dominant. Mild narrowing and irregularity basilar artery without high-grade stenosis. CT VENOGRAM OF THE HEAD Major dural sinuses are patent. No obvious cortical vein thrombosis.  Electronically Signed   By: Genia Del M.D.   On: 03/21/2016 14:59   Ct Head Wo Contrast  03/20/2016  ADDENDUM REPORT: 03/20/2016 14:15 ADDENDUM: Study discussed by telephone with Dr. Andee Poles RAY on 03/20/2016 at 14:14 . At 1407 hours. Electronically Signed   By: Genevie Ann M.D.   On: 03/20/2016 14:15  03/20/2016  CLINICAL DATA:  70 year old female with sudden onset left side weakness last night at 2000 hours. Headache. No known trauma. Initial encounter. EXAM: CT HEAD WITHOUT CONTRAST TECHNIQUE: Contiguous axial images were obtained from the base of the skull through the vertex without intravenous contrast. COMPARISON:  None. FINDINGS: No osseous abnormality identified. Trace fluid in the left sphenoid sinus. Other Visualized paranasal sinuses and mastoids are well pneumatized. Negative visualized orbit and scalp soft tissues. Mild Calcified atherosclerosis at the skull base. Intra-axial parenchymal hemorrhage in the anterior right frontal lobe is somewhat flame shaped and encompasses 30 x 34 x 39 mL (AP by transverse by CC) foreign estimated hemorrhage volume of 20 mL. The hemorrhage extends to the interhemispheric fissure, and it is difficult to exclude a trace subdural component along the falx at this time (series 21, image 19). No intraventricular extension. No other extra-axial hemorrhage identified. Surrounding hypodense edema with mild regional mass effect. 1-2 mm of focal leftward midline shift. Mild mass effect on the right frontal horn. Basilar cisterns remain patent. No ventriculomegaly. Patchy bilateral cerebral white matter hypodensity elsewhere. No other acute cortically based infarct. No suspicious intracranial vascular hyperdensity. IMPRESSION: 1. Acute anterior right frontal lobe intra-axial hemorrhage. Estimated hemorrhage volume 20 mL. Possible trace subdural extension at this time along the interhemispheric fissure. 2. Surrounding edema with mild regional mass effect. No intraventricular or  other extra-axial extension. 3. Underlying moderate nonspecific cerebral white matter signal changes. 4. Top differential considerations include hypertensive/small vessel related hemorrhage, sequelae of acute coagulopathy, amyloid angiopathy, and less likely vascular malformation or tumor related. Electronically Signed: By: Genevie Ann M.D. On: 03/20/2016 14:02   Ct Angio Neck W Or Wo Contrast  03/21/2016  CLINICAL DATA:  70 year old female with intracranial hemorrhage. Left-sided weakness. Subsequent encounter. EXAM: CT ANGIOGRAPHY HEAD AND NECK CT VENOGRAM TECHNIQUE: Multidetector CT imaging of the head and neck was performed using the standard protocol during bolus administration of intravenous contrast. Multiplanar CT image reconstructions and MIPs were obtained to evaluate the vascular anatomy. Carotid stenosis measurements (when applicable) are obtained utilizing NASCET criteria, using the distal internal carotid diameter as the denominator. CONTRAST:  50 cc Isovue 370. COMPARISON:  03/20/2016 head CT and brain MR. FINDINGS: CT HEAD Brain: Right frontal lobe bilobed hematoma spanning over 3.2 x 4.3 x 3.7 cm with surrounding vasogenic edema and mass effect upon the right lateral ventricle (which is displaced inferiorly) appears similar to recent MR/CT. Calvarium and skull base: Negative for acute abnormality. Paranasal sinuses: Moderate air-fluid level right maxillary sinus. Small air-fluid level left sphenoid sinus. Orbits: No acute abnormality. CTA NECK Aortic arch: 3 vessel arch. Calcification and minimal bulge undersurface aortic arch. Right carotid system: Ectatic without significant  stenosis. Left carotid system: Ectatic without significant stenosis. Vertebral arteries:No significant stenosis. Left vertebral artery is dominant. Skeleton: Reversal normal cervical lordosis with cervical spondylotic changes most prominent C3-4 thru C6-7. Prominent mandibular torus incidentally noted. Other neck: Right thyroid  2.6 cm lesion. Thyroid ultrasound recommended for further delineation. No worrisome lung apical lesion. CTA HEAD Anterior circulation: Anterior circulation without medium or large size vessel significant stenosis or occlusion. Fetal contribution to the posterior cerebral artery more notable on the right. Minimal calcified plaque cavernous segment right internal carotid artery with slight narrowing. Vessels are displaced by right frontal lobe hematoma. Posterior to the right frontal lobe hematoma, slight prominence of vessels medial aspect of the junction of the right frontal and parietal lobe (series 504, image 135). Question crowding of vessels versus small vascular malformation involving distal right anterior cerebral artery. Posterior circulation: Left vertebral artery is dominant. Mild narrowing and irregularity basilar artery without high-grade stenosis. Venous sinuses: As below. Anatomic variants: As above. Delayed phase: As above. CT VENOGRAM OF THE HEAD: Major dural sinuses are patent. No obvious cortical vein thrombosis. IMPRESSION: CT HEAD Right frontal lobe bilobed hematoma spanning over 3.2 x 4.3 x 3.7 cm with surrounding vasogenic edema and mass effect upon the right lateral ventricle (which is displaced inferiorly) appears similar to recent MR/CT. Moderate air-fluid level right maxillary sinus. Small air-fluid level left sphenoid sinus. CTA NECK Ectatic carotid arteries without significant stenosis. No significant stenosis of either vertebral artery. Left vertebral artery is dominant. Reversal normal cervical lordosis with cervical spondylotic changes most prominent C3-4 thru C6-7. Right thyroid 2.6 cm lesion. Thyroid ultrasound recommended for further delineation. CTA HEAD Anterior circulation without medium or large size vessel significant stenosis or occlusion. Vessels are displaced by right frontal lobe hematoma. Posterior to the right frontal lobe hematoma, slight prominence of vessels medial  aspect of the junction of the right frontal and parietal lobe (series 504, image 135). Question crowding of vessels versus small vascular malformation involving distal right anterior cerebral artery. Left vertebral artery is dominant. Mild narrowing and irregularity basilar artery without high-grade stenosis. CT VENOGRAM OF THE HEAD Major dural sinuses are patent. No obvious cortical vein thrombosis. Electronically Signed   By: Genia Del M.D.   On: 03/21/2016 14:59   Mr Brain Wo Contrast  03/20/2016  CLINICAL DATA:  Followup intracranial hemorrhage. Recently fell from ladder but does not described striking the head. EXAM: MRI HEAD WITHOUT CONTRAST TECHNIQUE: Multiplanar, multiecho pulse sequences of the brain and surrounding structures were obtained without intravenous contrast. COMPARISON:  Head CT same day FINDINGS: The brain shows a background pattern of moderate chronic small-vessel ischemic change throughout the cerebral hemispheric deep and subcortical white matter. Mild chronic small-vessel changes present in the pons. Within the right posterior frontal region, there is late acute/ early subacute intraparenchymal hemorrhage with a bilobed hematoma measuring in total 3.2 cm right to left, 4.3 cm front to back and 3.7 cm cephalo caudal. I think this is unchanged in size, some of the apparent larger AP dimension at MRI due to the fact that MRI is able to see the isodense components. Surrounding vasogenic edema, also consistent with the late acute/ early subacute timeframe. There is mild mass-effect for there is no midline shift. No threatening herniation. No finding by MRI of that would suggest an underlying mass lesion or high-flow vascular abnormality. There are not other foci of blood products in the brain, arguing against chronic amyloid disease. No hydrocephalus. No extra-axial collection or blood component.  Layering fluid in the right maxillary sinus consistent with right maxillary sinusitis. Small  mastoid effusion on the right. No pituitary mass. Major vessels at the base of the brain show flow. IMPRESSION: Late acute/ early subacute intraparenchymal hemorrhage in the right posterior frontal lobe with layering and surrounding edema. I would estimate that this hemorrhage is between 68 and 10-days-old. I do not believe there has been any additional bleeding since the CT. Larger measurements in the front to back dimension relate to the ability of the MRI to characterize isodense components of the hematoma. No sign of underlying tumor or high-flow vascular malformation. No other foci of blood products in the brain, arguing against amyloid angiopathy. Electronically Signed   By: Nelson Chimes M.D.   On: 03/20/2016 17:56   Ct Venogram Head  03/21/2016  CLINICAL DATA:  70 year old female with intracranial hemorrhage. Left-sided weakness. Subsequent encounter. EXAM: CT ANGIOGRAPHY HEAD AND NECK CT VENOGRAM TECHNIQUE: Multidetector CT imaging of the head and neck was performed using the standard protocol during bolus administration of intravenous contrast. Multiplanar CT image reconstructions and MIPs were obtained to evaluate the vascular anatomy. Carotid stenosis measurements (when applicable) are obtained utilizing NASCET criteria, using the distal internal carotid diameter as the denominator. CONTRAST:  50 cc Isovue 370. COMPARISON:  03/20/2016 head CT and brain MR. FINDINGS: CT HEAD Brain: Right frontal lobe bilobed hematoma spanning over 3.2 x 4.3 x 3.7 cm with surrounding vasogenic edema and mass effect upon the right lateral ventricle (which is displaced inferiorly) appears similar to recent MR/CT. Calvarium and skull base: Negative for acute abnormality. Paranasal sinuses: Moderate air-fluid level right maxillary sinus. Small air-fluid level left sphenoid sinus. Orbits: No acute abnormality. CTA NECK Aortic arch: 3 vessel arch. Calcification and minimal bulge undersurface aortic arch. Right carotid system:  Ectatic without significant stenosis. Left carotid system: Ectatic without significant stenosis. Vertebral arteries:No significant stenosis. Left vertebral artery is dominant. Skeleton: Reversal normal cervical lordosis with cervical spondylotic changes most prominent C3-4 thru C6-7. Prominent mandibular torus incidentally noted. Other neck: Right thyroid 2.6 cm lesion. Thyroid ultrasound recommended for further delineation. No worrisome lung apical lesion. CTA HEAD Anterior circulation: Anterior circulation without medium or large size vessel significant stenosis or occlusion. Fetal contribution to the posterior cerebral artery more notable on the right. Minimal calcified plaque cavernous segment right internal carotid artery with slight narrowing. Vessels are displaced by right frontal lobe hematoma. Posterior to the right frontal lobe hematoma, slight prominence of vessels medial aspect of the junction of the right frontal and parietal lobe (series 504, image 135). Question crowding of vessels versus small vascular malformation involving distal right anterior cerebral artery. Posterior circulation: Left vertebral artery is dominant. Mild narrowing and irregularity basilar artery without high-grade stenosis. Venous sinuses: As below. Anatomic variants: As above. Delayed phase: As above. CT VENOGRAM OF THE HEAD: Major dural sinuses are patent. No obvious cortical vein thrombosis. IMPRESSION: CT HEAD Right frontal lobe bilobed hematoma spanning over 3.2 x 4.3 x 3.7 cm with surrounding vasogenic edema and mass effect upon the right lateral ventricle (which is displaced inferiorly) appears similar to recent MR/CT. Moderate air-fluid level right maxillary sinus. Small air-fluid level left sphenoid sinus. CTA NECK Ectatic carotid arteries without significant stenosis. No significant stenosis of either vertebral artery. Left vertebral artery is dominant. Reversal normal cervical lordosis with cervical spondylotic changes  most prominent C3-4 thru C6-7. Right thyroid 2.6 cm lesion. Thyroid ultrasound recommended for further delineation. CTA HEAD Anterior circulation without medium or large  size vessel significant stenosis or occlusion. Vessels are displaced by right frontal lobe hematoma. Posterior to the right frontal lobe hematoma, slight prominence of vessels medial aspect of the junction of the right frontal and parietal lobe (series 504, image 135). Question crowding of vessels versus small vascular malformation involving distal right anterior cerebral artery. Left vertebral artery is dominant. Mild narrowing and irregularity basilar artery without high-grade stenosis. CT VENOGRAM OF THE HEAD Major dural sinuses are patent. No obvious cortical vein thrombosis. Electronically Signed   By: Genia Del M.D.   On: 03/21/2016 14:59       PHYSICAL EXAM  pleasant middle-age Caucasian lady currently not in distress. . Afebrile. Head is nontraumatic. Neck is supple without bruit.    Cardiac exam no murmur or gallop. Lungs are clear to auscultation. Distal pulses are well  felt Neurological Exam :  Awake alert slightly slow to respond to questions and process information but no definite aphasia. Oriented to time place and person. Follows one and two-step commands. No right left confusion. Vision acuity since adequate. Fundi were not visualized. Vision acuity and fields seem normal. Extraocular moments are full range without nystagmus. Mild left lower facial weakness. Tongue midline. Motor system exam mild left upper and lower extremity drift. Mild weakness of left grip and intrinsic hand muscles. Orbits right over left upper extremity. 4/5 left upper and lower extremity strength. Coordination slightly slow and impaired on the left compared to the right. Touch and pinprick sensation are preserved. Position sense possibly slightly impaired on the left but inconsistent. Deep tendon pulses are symmetric. Left plantar is equivocal  right is downgoing. Gait was not tested. .ASSESSMENT/PLAN Ms. Denys Parrado is a 70 y.o. female with history of depression, colon polyps and osteoporosis presenting with expressive aphaisa and confusion. CT shows R frontal ICH.  Stroke:  right frontal lobe ICH secondary to unknown source. Location not typical for a hypertensive etiology. Doubt amyloid given lack of other signs of hemorrhage on imaging. No underlying tumor seen on MRI. Concern for AVM vs aneurysm given family history.   Resultant  Expressive aphasia, mild L hemiparesis  CT R frontal lobe ICH, 20 mL, possible trace SDH  MRI  R posterior frontal lobe ICH, 17-57 days old. No other blood products seen.  CTA head and neck  No avm/aneurysm  CT venogram no sinus thrombosis  LDL 84  HgbA1c ordered  SCDs for VTE prophylaxis Diet regular Room service appropriate?: Yes; Fluid consistency:: Thin  No antithrombotic prior to admission  Ongoing aggressive stroke risk factor management  Therapy recommendations:  Pending. Ok to be OOB.  Disposition:  pending   Headache  Secondary to [place]  Controlled with 1 dose tylenol and morphine  Hypertension  BP 163/95 on arrival in setting of neurologic symptoms  Stable this am at 120/72  No hx of HTN. On no home medications  Doubt hypertensive cause of ICH  Hyperglycemia  HgbA1c pending , goal < 7.0  Other Stroke Risk Factors  Advanced age  ETOH use, advised to drink no more than 1 drink(s) a day  Family hx stroke (maternal aunt w/ hemorrhage, paternal grandmother)  Other Active Problems  Hx depression  Routinely gives blood  Hospital day # [place] for Pager information 03/22/2016 1:03 PM  I have personally examined this patient, reviewed notes, independently viewed imaging studies, participated in medical decision making and plan of care. I have made any additions or clarifications directly  to the above note. Agree  with note above. She presented with left hand incoordination and mild left hemiparesis and cognitive slowing secondary to large right frontal parenchymal hemorrhage anxiety etiology to be determined. She remains at risk for neurological worsening, hematoma expansion, cerebral edema and midline shift and needs close neurological monitoring and aggressive blood pressure control. Recommend check cerebral catheter angiogram of the brain today to look for aneurysm, AVM a . I had a long discussion with the patient, and brother and Dr Estanislado Pandy   and answered questions This patient is critically ill and at significant risk of neurological worsening, death and care requires constant monitoring of vital signs, hemodynamics,respiratory and cardiac monitoring, extensive review of multiple databases, frequent neurological assessment, discussion with family, other specialists and medical decision making of high complexity.I have made any additions or clarifications directly to the above note.This critical care time does not reflect procedure time, or teaching time or supervisory time of PA/NP/Med Resident etc but could involve care discussion time.  I spent 30 minutes of neurocritical care time  in the care of  this patient.     Antony Contras, MD Medical Director [facility] Pager: [phone] 03/22/2016 10:03 AM  ADDENDUM : I reviewed cerebral catheter angiogram results with the patient. No definite AVM or aneurysm noted but abnormal right frontopolar branches raises the question of possible small AVM which may need to be ruled out by repeat angiogram in 2-3 months. Patient voiced understanding.  To contact Stroke Continuity provider, please refer to [email or web address] After hours, contact General Neurology

## 2016-03-22 NOTE — Sedation Documentation (Signed)
IR tech placing exoseal, will hold pressure as well.

## 2016-03-22 NOTE — Sedation Documentation (Signed)
MD talking with family about results.

## 2016-03-22 NOTE — Sedation Documentation (Signed)
IR tech holding pressure to R groin 

## 2016-03-22 NOTE — Evaluation (Signed)
Speech Language Pathology Evaluation Patient Details Name: Cynthia [name] MRN: [id number] DOB: 05/13/1946 Today's Date: 03/22/2016 Time: [id number] SLP Time Calculation (min) (ACUTE ONLY): 26 min  Problem List:  Patient Active Problem List   Diagnosis Date Noted  . Intracerebral hemorrhage 03/20/2016  . ICH (intracerebral hemorrhage) ([place]) 03/20/2016  . Colon polyps   . Osteoporosis   . HTN (hypertension) 01/08/2013   Past Medical History:  Past Medical History  Diagnosis Date  . Colon polyps   . Osteoporosis   . Depression     history of   Past Surgical History: History reviewed. No pertinent past surgical history. HPI:  70 y.o. female with history of depression, colon polyps and osteoporosis presenting with expressive aphasia and confusion and left side mild hemiplegia, CT shows R frontal ICH.   Assessment / Plan / Recommendation Clinical Impression  Pt exhibits left neglect with difficulty reading given moderate visual/verbal cueing. Although she demonstrates intermittent delays with verbal initiation and hesitations, her verbal abilities are greater than functional ability during various tasks. Decreased insight into deficits post hemorrhage which should increase as she becomes more active with therapies. She exhibits motor apraxia with difficulty initiating and ceasing movements. Affect is flat. Further ST recommended on CIR to facilitate independence. Sons present and results of assessment discussed with pt and sons.      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[employer]) Vocation: Retired (retired [employer])    Cognition  Overall Cognitive Status: Impaired/Different from baseline Arousal/Alertness: Awake/alert Orientation Level: Oriented X4 Attention: Sustained Sustained Attention: Appears intact Memory: Appears intact Awareness: Impaired Awareness Impairment: Emergent impairment;Anticipatory impairment;Intellectual impairment Problem Solving: Appears intact (verbal) Safety/Judgment: Impaired    Comprehension  Auditory Comprehension Overall Auditory Comprehension: Appears within functional limits for tasks assessed Visual Recognition/Discrimination Discrimination: Not tested Reading Comprehension Reading Status: Impaired Word level: Impaired Sentence Level: Impaired Interfering Components: Left neglect/inattention    Expression Expression Primary Mode of Expression: Verbal Verbal Expression Overall Verbal Expression: Impaired Initiation: Impaired Level of Generative/Spontaneous Verbalization: Conversation Repetition: No impairment Naming: No impairment Pragmatics: Impairment Impairments: Abnormal affect;Eye contact Written Expression Dominant Hand: Right Written Expression:  (TBA)   Oral / Motor  Oral Motor/Sensory Function Overall Oral Motor/Sensory Function: Within functional limits Motor Speech Overall Motor Speech: Appears within functional limits for tasks assessed Articulation: Within functional limitis Intelligibility: Intelligible Motor Planning: Witnin functional limits   GO                    Houston Siren 03/22/2016, 11:37 AM  Orbie Pyo Colvin Caroli.[employer] [phone]

## 2016-03-22 NOTE — Consult Note (Signed)
Chief Complaint: Patient was seen in consultation today for cerebral arteriogram Chief Complaint  Patient presents with  . Stroke Symptoms   at the request of Dr Antony Contras  Referring Physician(s): Dr Antony Contras  Supervising Physician: Luanne Bras  Patient Status: Inpatient  History of Present Illness: Cynthia [name] is a 70 y.o. female   Presented to ED 03/20/16 after seen in MD office -- worrisome for stroke Confusion; slightly aphasic She stated she actually developed symptoms mildly 7/8 pm at home Slight confusion; slow words and speech Left side weaker Admits that while working at home- she did fall from ladder - just 2 rungs off ground. Fell backwards and fell on back side Denies hitting head Symptoms ensued later that evening and am  CTA 03/21/16: IMPRESSION: CT HEAD Right frontal lobe bilobed hematoma spanning over 3.2 x 4.3 x 3.7 cm with surrounding vasogenic edema and mass effect upon the right lateral ventricle (which is displaced inferiorly) appears similar to recent MR/CT. Moderate air-fluid level right maxillary sinus. Small air-fluid level left sphenoid sinus. CTA NECK Ectatic carotid arteries without significant stenosis. No significant stenosis of either vertebral artery. Left vertebral artery is dominant. Reversal normal cervical lordosis with cervical spondylotic changes most prominent C3-4 thru C6-7. Right thyroid 2.6 cm lesion. Thyroid ultrasound recommended for further delineation.  CTA HEAD Anterior circulation without medium or large size vessel significant stenosis or occlusion. Vessels are displaced by right frontal lobe hematoma. Posterior to the right frontal lobe hematoma, slight prominence of vessels medial aspect of the junction of the right frontal and parietal lobe (series 504, image 135). Question crowding of vessels versus small vascular malformation involving distal right anterior cerebral artery. Left  vertebral artery is dominant. Mild narrowing and irregularity basilar artery without high-grade stenosis.  CT VENOGRAM OF THE HEAD Major dural sinuses are patent. No obvious cortical vein thrombosis.  Request now for cerebral arteriogram per Dr Leonie Man Scheduled today  Past Medical History  Diagnosis Date  . Colon polyps   . Osteoporosis   . Depression     history of    History reviewed. No pertinent past surgical history.  Allergies: Other and Penicillins  Medications: Prior to Admission medications   Medication Sig Start Date End Date Taking? Authorizing Provider  ALPRAZolam (XANAX) 0.25 MG tablet TAKE 1 TABLET 3 TIMES DAILY AS NEEDED FOR ANXIETY Patient taking differently: TAKE 1 TABLET 2 TIMES DAILY AS NEEDED FOR ANXIETY 02/05/16  Yes Susy Frizzle, MD  Calcium Carbonate-Vitamin D (OS-CAL 500 + D PO) Take 1 tablet by mouth daily.    Yes Historical Provider, MD  meloxicam (MOBIC) 15 MG tablet TAKE 1 TABLET BY MOUTH EVERY DAY Patient taking differently: TAKE 1 TABLET (15 mg) BY MOUTH EVERY DAY as needed for lower back pain 07/08/15  Yes Susy Frizzle, MD  cetirizine (ZYRTEC) 10 MG tablet Take 10 mg by mouth daily as needed for allergies.     Historical Provider, MD  EPIPEN 2-PAK 0.3 MG/0.3ML SOAJ injection INJECT 0.3 MLS (0.3 MG TOTAL) INTO THE MUSCLE ONCE. 06/29/15   Susy Frizzle, MD  ferrous sulfate 325 (65 FE) MG tablet Take 325 mg by mouth daily as needed (to improve hemoglobin).     Historical Provider, MD     Family History  Problem Relation Age of Onset  . Heart disease Mother   . Cancer Mother     lung    Social History   Social History  . Marital Status: Married  Spouse Name: N/A  . Number of Children: N/A  . Years of Education: N/A   Social History Main Topics  . Smoking status: Never Smoker   . Smokeless tobacco: Never Used  . Alcohol Use: No     Comment: 1-2 beers/wine in evening  . Drug Use: No  . Sexual Activity: Not Asked      Comment: Married since 2005   Other Topics Concern  . None   Social History Narrative     Review of Systems: A 12 point ROS discussed and pertinent positives are indicated in the HPI above.  All other systems are negative.  Review of Systems  Constitutional: Positive for activity change and appetite change. Negative for fever.  HENT: Negative for hearing loss, tinnitus and trouble swallowing.   Eyes: Negative for visual disturbance.  Respiratory: Negative for shortness of breath.   Gastrointestinal: Negative for abdominal pain.  Musculoskeletal: Negative for back pain, gait problem and neck pain.  Neurological: Positive for weakness. Negative for dizziness, tremors, seizures, syncope, facial asymmetry, speech difficulty, light-headedness, numbness and headaches.  Psychiatric/Behavioral: Negative for behavioral problems and confusion.    Vital Signs: BP 144/69 mmHg  Pulse 74  Temp(Src) 98.8 F (37.1 C) (Oral)  Resp 15  Ht 5\' 6"  (1.676 m)  Wt 153 lb 7 oz (69.6 kg)  BMI 24.78 kg/m2  SpO2 98%  Physical Exam  Constitutional: She is oriented to person, place, and time. She appears well-nourished.  HENT:  Head: Atraumatic.  Eyes: EOM are normal.  Neck: Neck supple.  Cardiovascular: Normal rate, regular rhythm and normal heart sounds.   Pulmonary/Chest: Effort normal and breath sounds normal. She has no wheezes.  Abdominal: Soft. Bowel sounds are normal. There is no tenderness.  Musculoskeletal: Normal range of motion.  Neurological: She is alert and oriented to person, place, and time.  Speech is slow but appropriate  Skin: Skin is warm and dry.  Psychiatric: She has a normal mood and affect. Her behavior is normal. Judgment and thought content normal.  Nursing note and vitals reviewed.   Mallampati Score:  MD Evaluation Airway: WNL Heart: WNL Abdomen: WNL Chest/ Lungs: WNL ASA  Classification: 2 Mallampati/Airway Score: Two  Imaging: Ct Angio Head W Or Wo  Contrast  03/21/2016  CLINICAL DATA:  70 year old female with intracranial hemorrhage. Left-sided weakness. Subsequent encounter. EXAM: CT ANGIOGRAPHY HEAD AND NECK CT VENOGRAM TECHNIQUE: Multidetector CT imaging of the head and neck was performed using the standard protocol during bolus administration of intravenous contrast. Multiplanar CT image reconstructions and MIPs were obtained to evaluate the vascular anatomy. Carotid stenosis measurements (when applicable) are obtained utilizing NASCET criteria, using the distal internal carotid diameter as the denominator. CONTRAST:  50 cc Isovue 370. COMPARISON:  03/20/2016 head CT and brain MR. FINDINGS: CT HEAD Brain: Right frontal lobe bilobed hematoma spanning over 3.2 x 4.3 x 3.7 cm with surrounding vasogenic edema and mass effect upon the right lateral ventricle (which is displaced inferiorly) appears similar to recent MR/CT. Calvarium and skull base: Negative for acute abnormality. Paranasal sinuses: Moderate air-fluid level right maxillary sinus. Small air-fluid level left sphenoid sinus. Orbits: No acute abnormality. CTA NECK Aortic arch: 3 vessel arch. Calcification and minimal bulge undersurface aortic arch. Right carotid system: Ectatic without significant stenosis. Left carotid system: Ectatic without significant stenosis. Vertebral arteries:No significant stenosis. Left vertebral artery is dominant. Skeleton: Reversal normal cervical lordosis with cervical spondylotic changes most prominent C3-4 thru C6-7. Prominent mandibular torus incidentally noted. Other neck: Right  thyroid 2.6 cm lesion. Thyroid ultrasound recommended for further delineation. No worrisome lung apical lesion. CTA HEAD Anterior circulation: Anterior circulation without medium or large size vessel significant stenosis or occlusion. Fetal contribution to the posterior cerebral artery more notable on the right. Minimal calcified plaque cavernous segment right internal carotid artery with  slight narrowing. Vessels are displaced by right frontal lobe hematoma. Posterior to the right frontal lobe hematoma, slight prominence of vessels medial aspect of the junction of the right frontal and parietal lobe (series 504, image 135). Question crowding of vessels versus small vascular malformation involving distal right anterior cerebral artery. Posterior circulation: Left vertebral artery is dominant. Mild narrowing and irregularity basilar artery without high-grade stenosis. Venous sinuses: As below. Anatomic variants: As above. Delayed phase: As above. CT VENOGRAM OF THE HEAD: Major dural sinuses are patent. No obvious cortical vein thrombosis. IMPRESSION: CT HEAD Right frontal lobe bilobed hematoma spanning over 3.2 x 4.3 x 3.7 cm with surrounding vasogenic edema and mass effect upon the right lateral ventricle (which is displaced inferiorly) appears similar to recent MR/CT. Moderate air-fluid level right maxillary sinus. Small air-fluid level left sphenoid sinus. CTA NECK Ectatic carotid arteries without significant stenosis. No significant stenosis of either vertebral artery. Left vertebral artery is dominant. Reversal normal cervical lordosis with cervical spondylotic changes most prominent C3-4 thru C6-7. Right thyroid 2.6 cm lesion. Thyroid ultrasound recommended for further delineation. CTA HEAD Anterior circulation without medium or large size vessel significant stenosis or occlusion. Vessels are displaced by right frontal lobe hematoma. Posterior to the right frontal lobe hematoma, slight prominence of vessels medial aspect of the junction of the right frontal and parietal lobe (series 504, image 135). Question crowding of vessels versus small vascular malformation involving distal right anterior cerebral artery. Left vertebral artery is dominant. Mild narrowing and irregularity basilar artery without high-grade stenosis. CT VENOGRAM OF THE HEAD Major dural sinuses are patent. No obvious cortical  vein thrombosis. Electronically Signed   By: Genia Del M.D.   On: 03/21/2016 14:59   Ct Head Wo Contrast  03/20/2016  ADDENDUM REPORT: 03/20/2016 14:15 ADDENDUM: Study discussed by telephone with Dr. Andee Poles RAY on 03/20/2016 at 14:14 . At 1407 hours. Electronically Signed   By: Genevie Ann M.D.   On: 03/20/2016 14:15  03/20/2016  CLINICAL DATA:  70 year old female with sudden onset left side weakness last night at 2000 hours. Headache. No known trauma. Initial encounter. EXAM: CT HEAD WITHOUT CONTRAST TECHNIQUE: Contiguous axial images were obtained from the base of the skull through the vertex without intravenous contrast. COMPARISON:  None. FINDINGS: No osseous abnormality identified. Trace fluid in the left sphenoid sinus. Other Visualized paranasal sinuses and mastoids are well pneumatized. Negative visualized orbit and scalp soft tissues. Mild Calcified atherosclerosis at the skull base. Intra-axial parenchymal hemorrhage in the anterior right frontal lobe is somewhat flame shaped and encompasses 30 x 34 x 39 mL (AP by transverse by CC) foreign estimated hemorrhage volume of 20 mL. The hemorrhage extends to the interhemispheric fissure, and it is difficult to exclude a trace subdural component along the falx at this time (series 21, image 19). No intraventricular extension. No other extra-axial hemorrhage identified. Surrounding hypodense edema with mild regional mass effect. 1-2 mm of focal leftward midline shift. Mild mass effect on the right frontal horn. Basilar cisterns remain patent. No ventriculomegaly. Patchy bilateral cerebral white matter hypodensity elsewhere. No other acute cortically based infarct. No suspicious intracranial vascular hyperdensity. IMPRESSION: 1. Acute anterior right frontal lobe  intra-axial hemorrhage. Estimated hemorrhage volume 20 mL. Possible trace subdural extension at this time along the interhemispheric fissure. 2. Surrounding edema with mild regional mass effect. No  intraventricular or other extra-axial extension. 3. Underlying moderate nonspecific cerebral white matter signal changes. 4. Top differential considerations include hypertensive/small vessel related hemorrhage, sequelae of acute coagulopathy, amyloid angiopathy, and less likely vascular malformation or tumor related. Electronically Signed: By: Genevie Ann M.D. On: 03/20/2016 14:02   Ct Angio Neck W Or Wo Contrast  03/21/2016  CLINICAL DATA:  69 year old female with intracranial hemorrhage. Left-sided weakness. Subsequent encounter. EXAM: CT ANGIOGRAPHY HEAD AND NECK CT VENOGRAM TECHNIQUE: Multidetector CT imaging of the head and neck was performed using the standard protocol during bolus administration of intravenous contrast. Multiplanar CT image reconstructions and MIPs were obtained to evaluate the vascular anatomy. Carotid stenosis measurements (when applicable) are obtained utilizing NASCET criteria, using the distal internal carotid diameter as the denominator. CONTRAST:  50 cc Isovue 370. COMPARISON:  03/20/2016 head CT and brain MR. FINDINGS: CT HEAD Brain: Right frontal lobe bilobed hematoma spanning over 3.2 x 4.3 x 3.7 cm with surrounding vasogenic edema and mass effect upon the right lateral ventricle (which is displaced inferiorly) appears similar to recent MR/CT. Calvarium and skull base: Negative for acute abnormality. Paranasal sinuses: Moderate air-fluid level right maxillary sinus. Small air-fluid level left sphenoid sinus. Orbits: No acute abnormality. CTA NECK Aortic arch: 3 vessel arch. Calcification and minimal bulge undersurface aortic arch. Right carotid system: Ectatic without significant stenosis. Left carotid system: Ectatic without significant stenosis. Vertebral arteries:No significant stenosis. Left vertebral artery is dominant. Skeleton: Reversal normal cervical lordosis with cervical spondylotic changes most prominent C3-4 thru C6-7. Prominent mandibular torus incidentally noted. Other  neck: Right thyroid 2.6 cm lesion. Thyroid ultrasound recommended for further delineation. No worrisome lung apical lesion. CTA HEAD Anterior circulation: Anterior circulation without medium or large size vessel significant stenosis or occlusion. Fetal contribution to the posterior cerebral artery more notable on the right. Minimal calcified plaque cavernous segment right internal carotid artery with slight narrowing. Vessels are displaced by right frontal lobe hematoma. Posterior to the right frontal lobe hematoma, slight prominence of vessels medial aspect of the junction of the right frontal and parietal lobe (series 504, image 135). Question crowding of vessels versus small vascular malformation involving distal right anterior cerebral artery. Posterior circulation: Left vertebral artery is dominant. Mild narrowing and irregularity basilar artery without high-grade stenosis. Venous sinuses: As below. Anatomic variants: As above. Delayed phase: As above. CT VENOGRAM OF THE HEAD: Major dural sinuses are patent. No obvious cortical vein thrombosis. IMPRESSION: CT HEAD Right frontal lobe bilobed hematoma spanning over 3.2 x 4.3 x 3.7 cm with surrounding vasogenic edema and mass effect upon the right lateral ventricle (which is displaced inferiorly) appears similar to recent MR/CT. Moderate air-fluid level right maxillary sinus. Small air-fluid level left sphenoid sinus. CTA NECK Ectatic carotid arteries without significant stenosis. No significant stenosis of either vertebral artery. Left vertebral artery is dominant. Reversal normal cervical lordosis with cervical spondylotic changes most prominent C3-4 thru C6-7. Right thyroid 2.6 cm lesion. Thyroid ultrasound recommended for further delineation. CTA HEAD Anterior circulation without medium or large size vessel significant stenosis or occlusion. Vessels are displaced by right frontal lobe hematoma. Posterior to the right frontal lobe hematoma, slight prominence of  vessels medial aspect of the junction of the right frontal and parietal lobe (series 504, image 135). Question crowding of vessels versus small vascular malformation involving distal right anterior  cerebral artery. Left vertebral artery is dominant. Mild narrowing and irregularity basilar artery without high-grade stenosis. CT VENOGRAM OF THE HEAD Major dural sinuses are patent. No obvious cortical vein thrombosis. Electronically Signed   By: Genia Del M.D.   On: 03/21/2016 14:59   Mr Brain Wo Contrast  03/20/2016  CLINICAL DATA:  Followup intracranial hemorrhage. Recently fell from ladder but does not described striking the head. EXAM: MRI HEAD WITHOUT CONTRAST TECHNIQUE: Multiplanar, multiecho pulse sequences of the brain and surrounding structures were obtained without intravenous contrast. COMPARISON:  Head CT same day FINDINGS: The brain shows a background pattern of moderate chronic small-vessel ischemic change throughout the cerebral hemispheric deep and subcortical white matter. Mild chronic small-vessel changes present in the pons. Within the right posterior frontal region, there is late acute/ early subacute intraparenchymal hemorrhage with a bilobed hematoma measuring in total 3.2 cm right to left, 4.3 cm front to back and 3.7 cm cephalo caudal. I think this is unchanged in size, some of the apparent larger AP dimension at MRI due to the fact that MRI is able to see the isodense components. Surrounding vasogenic edema, also consistent with the late acute/ early subacute timeframe. There is mild mass-effect for there is no midline shift. No threatening herniation. No finding by MRI of that would suggest an underlying mass lesion or high-flow vascular abnormality. There are not other foci of blood products in the brain, arguing against chronic amyloid disease. No hydrocephalus. No extra-axial collection or blood component. Layering fluid in the right maxillary sinus consistent with right maxillary  sinusitis. Small mastoid effusion on the right. No pituitary mass. Major vessels at the base of the brain show flow. IMPRESSION: Late acute/ early subacute intraparenchymal hemorrhage in the right posterior frontal lobe with layering and surrounding edema. I would estimate that this hemorrhage is between 40 and 8-days-old. I do not believe there has been any additional bleeding since the CT. Larger measurements in the front to back dimension relate to the ability of the MRI to characterize isodense components of the hematoma. No sign of underlying tumor or high-flow vascular malformation. No other foci of blood products in the brain, arguing against amyloid angiopathy. Electronically Signed   By: Nelson Chimes M.D.   On: 03/20/2016 17:56   Ct Venogram Head  03/21/2016  CLINICAL DATA:  70 year old female with intracranial hemorrhage. Left-sided weakness. Subsequent encounter. EXAM: CT ANGIOGRAPHY HEAD AND NECK CT VENOGRAM TECHNIQUE: Multidetector CT imaging of the head and neck was performed using the standard protocol during bolus administration of intravenous contrast. Multiplanar CT image reconstructions and MIPs were obtained to evaluate the vascular anatomy. Carotid stenosis measurements (when applicable) are obtained utilizing NASCET criteria, using the distal internal carotid diameter as the denominator. CONTRAST:  50 cc Isovue 370. COMPARISON:  03/20/2016 head CT and brain MR. FINDINGS: CT HEAD Brain: Right frontal lobe bilobed hematoma spanning over 3.2 x 4.3 x 3.7 cm with surrounding vasogenic edema and mass effect upon the right lateral ventricle (which is displaced inferiorly) appears similar to recent MR/CT. Calvarium and skull base: Negative for acute abnormality. Paranasal sinuses: Moderate air-fluid level right maxillary sinus. Small air-fluid level left sphenoid sinus. Orbits: No acute abnormality. CTA NECK Aortic arch: 3 vessel arch. Calcification and minimal bulge undersurface aortic arch. Right  carotid system: Ectatic without significant stenosis. Left carotid system: Ectatic without significant stenosis. Vertebral arteries:No significant stenosis. Left vertebral artery is dominant. Skeleton: Reversal normal cervical lordosis with cervical spondylotic changes most prominent C3-4 thru  C6-7. Prominent mandibular torus incidentally noted. Other neck: Right thyroid 2.6 cm lesion. Thyroid ultrasound recommended for further delineation. No worrisome lung apical lesion. CTA HEAD Anterior circulation: Anterior circulation without medium or large size vessel significant stenosis or occlusion. Fetal contribution to the posterior cerebral artery more notable on the right. Minimal calcified plaque cavernous segment right internal carotid artery with slight narrowing. Vessels are displaced by right frontal lobe hematoma. Posterior to the right frontal lobe hematoma, slight prominence of vessels medial aspect of the junction of the right frontal and parietal lobe (series 504, image 135). Question crowding of vessels versus small vascular malformation involving distal right anterior cerebral artery. Posterior circulation: Left vertebral artery is dominant. Mild narrowing and irregularity basilar artery without high-grade stenosis. Venous sinuses: As below. Anatomic variants: As above. Delayed phase: As above. CT VENOGRAM OF THE HEAD: Major dural sinuses are patent. No obvious cortical vein thrombosis. IMPRESSION: CT HEAD Right frontal lobe bilobed hematoma spanning over 3.2 x 4.3 x 3.7 cm with surrounding vasogenic edema and mass effect upon the right lateral ventricle (which is displaced inferiorly) appears similar to recent MR/CT. Moderate air-fluid level right maxillary sinus. Small air-fluid level left sphenoid sinus. CTA NECK Ectatic carotid arteries without significant stenosis. No significant stenosis of either vertebral artery. Left vertebral artery is dominant. Reversal normal cervical lordosis with cervical  spondylotic changes most prominent C3-4 thru C6-7. Right thyroid 2.6 cm lesion. Thyroid ultrasound recommended for further delineation. CTA HEAD Anterior circulation without medium or large size vessel significant stenosis or occlusion. Vessels are displaced by right frontal lobe hematoma. Posterior to the right frontal lobe hematoma, slight prominence of vessels medial aspect of the junction of the right frontal and parietal lobe (series 504, image 135). Question crowding of vessels versus small vascular malformation involving distal right anterior cerebral artery. Left vertebral artery is dominant. Mild narrowing and irregularity basilar artery without high-grade stenosis. CT VENOGRAM OF THE HEAD Major dural sinuses are patent. No obvious cortical vein thrombosis. Electronically Signed   By: Genia Del M.D.   On: 03/21/2016 14:59    Labs:  CBC:  Recent Labs  06/19/15 0833 03/20/16 1243 03/20/16 1250 03/21/16 0554 03/22/16 0255  WBC 5.3 7.8  --  6.9 8.8  HGB 12.0 13.0 13.3 11.8* 11.9*  HCT 35.5* 38.7 39.0 35.1* 35.1*  PLT 310 290  --  269 253    COAGS:  Recent Labs  03/20/16 1243  INR 1.03  APTT 32    BMP:  Recent Labs  06/19/15 0833 03/20/16 1243 03/20/16 1250 03/21/16 0554 03/22/16 0255  NA 144 136 137 133* 131*  K 4.4 3.8 3.8 4.1 3.6  CL 106 103 102 102 100*  CO2 28 24  --  24 24  GLUCOSE 87 139* 139* 130* 107*  BUN 13 11 12 10 7   CALCIUM 8.5* 9.5  --  8.6* 8.6*  CREATININE 0.69 0.69 0.70 0.61 0.63  GFRNONAA >89 >60  --  >60 >60  GFRAA >89 >60  --  >60 >60    LIVER FUNCTION TESTS:  Recent Labs  06/19/15 0833 03/20/16 1243  BILITOT 0.6 0.8  AST 22 26  ALT 15 15  ALKPHOS 70 80  PROT 5.6* 6.6  ALBUMIN 3.8 4.0    TUMOR MARKERS: No results for input(s): AFPTM, CEA, CA199, CHROMGRNA in the last 8760 hours.  Assessment and Plan:  Confusion and speech changes---resolving CTA reveals R hematoma; possible small vascular malformation R ACA Scheduled  now for cerebral  arteriogram for further evaluation Risks and Benefits discussed with the patient including, but not limited to bleeding, infection, vascular injury, contrast induced renal failure, stroke or even death. All of the patient's questions were answered, patient is agreeable to proceed. Consent signed and in chart.  Thank you for this interesting consult.  I greatly enjoyed meeting Ebelia Marlo and look forward to participating in their care.  A copy of this report was sent to the requesting provider on this date.  Electronically Signed: Talesha Ellithorpe A 03/22/2016, 7:19 AM   I spent a total of 40 Minutes    in face to face in clinical consultation, greater than 50% of which was counseling/coordinating care for cerebral arteriogram

## 2016-03-22 NOTE — Progress Notes (Signed)
OT Cancellation Note  Patient Details Name: Cynthia [name] MRN: [id number] DOB: [birth year]   Cancelled Treatment:    Reason Eval/Treat Not Completed: Patient at procedure or test/ unavailable (IR). Pt leaving unit on OT arrival to go to IR  Vonita Moss   OTR/L Pager: [phone] Office: [phone] .  03/22/2016, 8:19 AM

## 2016-03-22 NOTE — Sedation Documentation (Signed)
Sats 92-93% on 2L/Waterproof; MD aware. Pt appears very groggy.

## 2016-03-22 NOTE — Progress Notes (Signed)
PT Cancellation Note  Patient Details Name: Clarity Wi MRN: [id number] DOB: 1945-11-08   Cancelled Treatment:    Reason Eval/Treat Not Completed: Medical issues which prohibited therapy, patient actively vomiting and requested to try tomorrow.   Duncan Dull 03/22/2016, 2:02 PM Alben Deeds, [place] DPT  [phone]

## 2016-03-22 NOTE — Progress Notes (Signed)
SLP Cancellation Note  Patient Details Name: Cynthia [name] MRN: [id number] DOB: [birth year]   Cancelled treatment:        Out of room for cerebral angiogram. Will continue efforts for speech-lang-cognitive assessment.    Houston Siren 03/22/2016, 10:01 AM   Orbie Pyo Colvin Caroli.Ed [employer] [phone]

## 2016-03-22 NOTE — Procedures (Signed)
S/P 4 vessel cerebral arteriogram. RT CFA approach. Findings. 1.Large area of hypoperfusion RT Frontal lobe with mass effect, abnormal prominence of Rt frontopolar  branch od  Rt ACA a ! seg . 2. No av shunting or aneurysms noted  3.Venous outflow WNLs

## 2016-03-23 DIAGNOSIS — I610 Nontraumatic intracerebral hemorrhage in hemisphere, subcortical: Secondary | ICD-10-CM

## 2016-03-23 LAB — GLUCOSE, CAPILLARY
GLUCOSE-CAPILLARY: 119 mg/dL — AB (ref 65–99)
GLUCOSE-CAPILLARY: 119 mg/dL — AB (ref 65–99)
GLUCOSE-CAPILLARY: 107 mg/dL — AB (ref 65–99)
Glucose-Capillary: 104 mg/dL — ABNORMAL HIGH (ref 65–99)

## 2016-03-23 NOTE — Progress Notes (Signed)
STROKE TEAM PROGRESS NOTE   HISTORY OF PRESENT ILLNESS (per record) This is a 70 year old right-handed woman who presents to the [facility] Emergency department after being sent from her doctor's office due to concern for possible stroke. History is obtained directly from the patient who is a good historian. She is accompanied by family members to offer additional information as needed.  The patient reports that last night 03/19/2016 (LKW, time unknown) she developed a sense of feeling disoriented and having difficulty speaking. When asked about her speech, she describes that she has trouble focusing her thoughts and getting her words out. She reports that her sister also noticed that she seemed slow to respond to questions. She feels as if her speech is somewhat slurred as well. She does not endorse any weakness, numbness, vision changes, difficulty swallowing, or balance impairments. However, she has noticed that she will find her left arm in unusual postures and positions without being aware that it is doing anything. When she looks at it, she is able to control it without a problem and again denies any weakness or sensory changes. She has also had a headache, though it sounds as if this may have been present for the past 2 days. She currently reports her headache is improved. She denies any similar previous episodes.  She denies any recent trauma to the head. She does note that she was working with her brother at her home and had been up on a ladder. She was getting off the ladder, she thought she was on the ground but instead was on the second step of the latter when she stumbled backwards. She says that she struck her bottom on a nearby chair, then fell to the floor landing on her bottom. She did not strike her head. She has not had any recent illness. She does not use daily antiplatelet therapy or anticoagulation. She uses occasional Advil for headache and pain and has taken a total of six 200 mg tablets  since last night. She also reports that she took a baby aspirin earlier today as recommended by her sister because of her symptoms.  CT shows a large right frontal ICH. Patient was not administered IV t-PA secondary to [place]. She was admitted to the neuro ICU for further evaluation and treatment.   SUBJECTIVE (INTERVAL HISTORY) Patient is feeling much better today. Feels stronger. Husband at bedside. Will transfer to floor.   OBJECTIVE Temp:  [97.9 F (36.6 C)-98.7 F (37.1 C)] 97.9 F (36.6 C) (07/12 0800) Pulse Rate:  [51-83] 80 (07/12 0700) Cardiac Rhythm:  [-] Normal sinus rhythm (07/12 0800) Resp:  [11-22] 22 (07/12 0700) BP: (107-153)/(61-82) 141/82 mmHg (07/12 0700) SpO2:  [90 %-100 %] 100 % (07/12 0700)  CBC:   Recent Labs Lab 03/20/16 1243  03/21/16 0554 03/22/16 0255  WBC 7.8  --  6.9 8.8  NEUTROABS 5.7  --   --   --   HGB 13.0  < > 11.8* 11.9*  HCT 38.7  < > 35.1* 35.1*  MCV 89.4  --  89.8 88.4  PLT 290  --  269 253  < > = values in this interval not displayed.  Basic Metabolic Panel:   Recent Labs Lab 03/21/16 0554 03/22/16 0255  NA 133* 131*  K 4.1 3.6  CL 102 100*  CO2 24 24  GLUCOSE 130* 107*  BUN 10 7  CREATININE 0.61 0.63  CALCIUM 8.6* 8.6*    Lipid Panel:     Component Value  Date/Time   CHOL 181 03/21/2016 0554   TRIG 37 03/21/2016 0554   HDL 90 03/21/2016 0554   CHOLHDL 2.0 03/21/2016 0554   VLDL 7 03/21/2016 0554   LDLCALC 84 03/21/2016 0554   HgbA1c:  Lab Results  Component Value Date   HGBA1C 5.3 03/22/2016   Urine Drug Screen:     Component Value Date/Time   LABOPIA NONE DETECTED 03/20/2016 1331   COCAINSCRNUR NONE DETECTED 03/20/2016 1331   LABBENZ NONE DETECTED 03/20/2016 1331   AMPHETMU NONE DETECTED 03/20/2016 1331   THCU NONE DETECTED 03/20/2016 1331   LABBARB NONE DETECTED 03/20/2016 1331      IMAGING  Ct Angio Head W Or Wo Contrast  03/21/2016  CLINICAL DATA:  70 year old female with intracranial hemorrhage.  Left-sided weakness. Subsequent encounter. EXAM: CT ANGIOGRAPHY HEAD AND NECK CT VENOGRAM TECHNIQUE: Multidetector CT imaging of the head and neck was performed using the standard protocol during bolus administration of intravenous contrast. Multiplanar CT image reconstructions and MIPs were obtained to evaluate the vascular anatomy. Carotid stenosis measurements (when applicable) are obtained utilizing NASCET criteria, using the distal internal carotid diameter as the denominator. CONTRAST:  50 cc Isovue 370. COMPARISON:  03/20/2016 head CT and brain MR. FINDINGS: CT HEAD Brain: Right frontal lobe bilobed hematoma spanning over 3.2 x 4.3 x 3.7 cm with surrounding vasogenic edema and mass effect upon the right lateral ventricle (which is displaced inferiorly) appears similar to recent MR/CT. Calvarium and skull base: Negative for acute abnormality. Paranasal sinuses: Moderate air-fluid level right maxillary sinus. Small air-fluid level left sphenoid sinus. Orbits: No acute abnormality. CTA NECK Aortic arch: 3 vessel arch. Calcification and minimal bulge undersurface aortic arch. Right carotid system: Ectatic without significant stenosis. Left carotid system: Ectatic without significant stenosis. Vertebral arteries:No significant stenosis. Left vertebral artery is dominant. Skeleton: Reversal normal cervical lordosis with cervical spondylotic changes most prominent C3-4 thru C6-7. Prominent mandibular torus incidentally noted. Other neck: Right thyroid 2.6 cm lesion. Thyroid ultrasound recommended for further delineation. No worrisome lung apical lesion. CTA HEAD Anterior circulation: Anterior circulation without medium or large size vessel significant stenosis or occlusion. Fetal contribution to the posterior cerebral artery more notable on the right. Minimal calcified plaque cavernous segment right internal carotid artery with slight narrowing. Vessels are displaced by right frontal lobe hematoma. Posterior to the  right frontal lobe hematoma, slight prominence of vessels medial aspect of the junction of the right frontal and parietal lobe (series 504, image 135). Question crowding of vessels versus small vascular malformation involving distal right anterior cerebral artery. Posterior circulation: Left vertebral artery is dominant. Mild narrowing and irregularity basilar artery without high-grade stenosis. Venous sinuses: As below. Anatomic variants: As above. Delayed phase: As above. CT VENOGRAM OF THE HEAD: Major dural sinuses are patent. No obvious cortical vein thrombosis. IMPRESSION: CT HEAD Right frontal lobe bilobed hematoma spanning over 3.2 x 4.3 x 3.7 cm with surrounding vasogenic edema and mass effect upon the right lateral ventricle (which is displaced inferiorly) appears similar to recent MR/CT. Moderate air-fluid level right maxillary sinus. Small air-fluid level left sphenoid sinus. CTA NECK Ectatic carotid arteries without significant stenosis. No significant stenosis of either vertebral artery. Left vertebral artery is dominant. Reversal normal cervical lordosis with cervical spondylotic changes most prominent C3-4 thru C6-7. Right thyroid 2.6 cm lesion. Thyroid ultrasound recommended for further delineation. CTA HEAD Anterior circulation without medium or large size vessel significant stenosis or occlusion. Vessels are displaced by right frontal lobe hematoma. Posterior to the  right frontal lobe hematoma, slight prominence of vessels medial aspect of the junction of the right frontal and parietal lobe (series 504, image 135). Question crowding of vessels versus small vascular malformation involving distal right anterior cerebral artery. Left vertebral artery is dominant. Mild narrowing and irregularity basilar artery without high-grade stenosis. CT VENOGRAM OF THE HEAD Major dural sinuses are patent. No obvious cortical vein thrombosis. Electronically Signed   By: Genia Del M.D.   On: 03/21/2016 14:59    Ct Angio Neck W Or Wo Contrast  03/21/2016  CLINICAL DATA:  70 year old female with intracranial hemorrhage. Left-sided weakness. Subsequent encounter. EXAM: CT ANGIOGRAPHY HEAD AND NECK CT VENOGRAM TECHNIQUE: Multidetector CT imaging of the head and neck was performed using the standard protocol during bolus administration of intravenous contrast. Multiplanar CT image reconstructions and MIPs were obtained to evaluate the vascular anatomy. Carotid stenosis measurements (when applicable) are obtained utilizing NASCET criteria, using the distal internal carotid diameter as the denominator. CONTRAST:  50 cc Isovue 370. COMPARISON:  03/20/2016 head CT and brain MR. FINDINGS: CT HEAD Brain: Right frontal lobe bilobed hematoma spanning over 3.2 x 4.3 x 3.7 cm with surrounding vasogenic edema and mass effect upon the right lateral ventricle (which is displaced inferiorly) appears similar to recent MR/CT. Calvarium and skull base: Negative for acute abnormality. Paranasal sinuses: Moderate air-fluid level right maxillary sinus. Small air-fluid level left sphenoid sinus. Orbits: No acute abnormality. CTA NECK Aortic arch: 3 vessel arch. Calcification and minimal bulge undersurface aortic arch. Right carotid system: Ectatic without significant stenosis. Left carotid system: Ectatic without significant stenosis. Vertebral arteries:No significant stenosis. Left vertebral artery is dominant. Skeleton: Reversal normal cervical lordosis with cervical spondylotic changes most prominent C3-4 thru C6-7. Prominent mandibular torus incidentally noted. Other neck: Right thyroid 2.6 cm lesion. Thyroid ultrasound recommended for further delineation. No worrisome lung apical lesion. CTA HEAD Anterior circulation: Anterior circulation without medium or large size vessel significant stenosis or occlusion. Fetal contribution to the posterior cerebral artery more notable on the right. Minimal calcified plaque cavernous segment right  internal carotid artery with slight narrowing. Vessels are displaced by right frontal lobe hematoma. Posterior to the right frontal lobe hematoma, slight prominence of vessels medial aspect of the junction of the right frontal and parietal lobe (series 504, image 135). Question crowding of vessels versus small vascular malformation involving distal right anterior cerebral artery. Posterior circulation: Left vertebral artery is dominant. Mild narrowing and irregularity basilar artery without high-grade stenosis. Venous sinuses: As below. Anatomic variants: As above. Delayed phase: As above. CT VENOGRAM OF THE HEAD: Major dural sinuses are patent. No obvious cortical vein thrombosis. IMPRESSION: CT HEAD Right frontal lobe bilobed hematoma spanning over 3.2 x 4.3 x 3.7 cm with surrounding vasogenic edema and mass effect upon the right lateral ventricle (which is displaced inferiorly) appears similar to recent MR/CT. Moderate air-fluid level right maxillary sinus. Small air-fluid level left sphenoid sinus. CTA NECK Ectatic carotid arteries without significant stenosis. No significant stenosis of either vertebral artery. Left vertebral artery is dominant. Reversal normal cervical lordosis with cervical spondylotic changes most prominent C3-4 thru C6-7. Right thyroid 2.6 cm lesion. Thyroid ultrasound recommended for further delineation. CTA HEAD Anterior circulation without medium or large size vessel significant stenosis or occlusion. Vessels are displaced by right frontal lobe hematoma. Posterior to the right frontal lobe hematoma, slight prominence of vessels medial aspect of the junction of the right frontal and parietal lobe (series 504, image 135). Question crowding of vessels versus small  vascular malformation involving distal right anterior cerebral artery. Left vertebral artery is dominant. Mild narrowing and irregularity basilar artery without high-grade stenosis. CT VENOGRAM OF THE HEAD Major dural sinuses are  patent. No obvious cortical vein thrombosis. Electronically Signed   By: Genia Del M.D.   On: 03/21/2016 14:59   Ct Venogram Head  03/21/2016  CLINICAL DATA:  70 year old female with intracranial hemorrhage. Left-sided weakness. Subsequent encounter. EXAM: CT ANGIOGRAPHY HEAD AND NECK CT VENOGRAM TECHNIQUE: Multidetector CT imaging of the head and neck was performed using the standard protocol during bolus administration of intravenous contrast. Multiplanar CT image reconstructions and MIPs were obtained to evaluate the vascular anatomy. Carotid stenosis measurements (when applicable) are obtained utilizing NASCET criteria, using the distal internal carotid diameter as the denominator. CONTRAST:  50 cc Isovue 370. COMPARISON:  03/20/2016 head CT and brain MR. FINDINGS: CT HEAD Brain: Right frontal lobe bilobed hematoma spanning over 3.2 x 4.3 x 3.7 cm with surrounding vasogenic edema and mass effect upon the right lateral ventricle (which is displaced inferiorly) appears similar to recent MR/CT. Calvarium and skull base: Negative for acute abnormality. Paranasal sinuses: Moderate air-fluid level right maxillary sinus. Small air-fluid level left sphenoid sinus. Orbits: No acute abnormality. CTA NECK Aortic arch: 3 vessel arch. Calcification and minimal bulge undersurface aortic arch. Right carotid system: Ectatic without significant stenosis. Left carotid system: Ectatic without significant stenosis. Vertebral arteries:No significant stenosis. Left vertebral artery is dominant. Skeleton: Reversal normal cervical lordosis with cervical spondylotic changes most prominent C3-4 thru C6-7. Prominent mandibular torus incidentally noted. Other neck: Right thyroid 2.6 cm lesion. Thyroid ultrasound recommended for further delineation. No worrisome lung apical lesion. CTA HEAD Anterior circulation: Anterior circulation without medium or large size vessel significant stenosis or occlusion. Fetal contribution to the  posterior cerebral artery more notable on the right. Minimal calcified plaque cavernous segment right internal carotid artery with slight narrowing. Vessels are displaced by right frontal lobe hematoma. Posterior to the right frontal lobe hematoma, slight prominence of vessels medial aspect of the junction of the right frontal and parietal lobe (series 504, image 135). Question crowding of vessels versus small vascular malformation involving distal right anterior cerebral artery. Posterior circulation: Left vertebral artery is dominant. Mild narrowing and irregularity basilar artery without high-grade stenosis. Venous sinuses: As below. Anatomic variants: As above. Delayed phase: As above. CT VENOGRAM OF THE HEAD: Major dural sinuses are patent. No obvious cortical vein thrombosis. IMPRESSION: CT HEAD Right frontal lobe bilobed hematoma spanning over 3.2 x 4.3 x 3.7 cm with surrounding vasogenic edema and mass effect upon the right lateral ventricle (which is displaced inferiorly) appears similar to recent MR/CT. Moderate air-fluid level right maxillary sinus. Small air-fluid level left sphenoid sinus. CTA NECK Ectatic carotid arteries without significant stenosis. No significant stenosis of either vertebral artery. Left vertebral artery is dominant. Reversal normal cervical lordosis with cervical spondylotic changes most prominent C3-4 thru C6-7. Right thyroid 2.6 cm lesion. Thyroid ultrasound recommended for further delineation. CTA HEAD Anterior circulation without medium or large size vessel significant stenosis or occlusion. Vessels are displaced by right frontal lobe hematoma. Posterior to the right frontal lobe hematoma, slight prominence of vessels medial aspect of the junction of the right frontal and parietal lobe (series 504, image 135). Question crowding of vessels versus small vascular malformation involving distal right anterior cerebral artery. Left vertebral artery is dominant. Mild narrowing and  irregularity basilar artery without high-grade stenosis. CT VENOGRAM OF THE HEAD Major dural sinuses are patent. No  obvious cortical vein thrombosis. Electronically Signed   By: Genia Del M.D.   On: 03/21/2016 14:59       PHYSICAL EXAM  pleasant middle-age Caucasian lady currently not in distress. . Afebrile. Head is nontraumatic. Neck is supple without bruit.    Cardiac exam no murmur or gallop. Lungs are clear to auscultation. Distal pulses are well  felt Neurological Exam :  Awake alert follows commands speech without aphasia. Oriented to time place and person. Follows one and two-step commands. No right left confusion. Vision acuity intact. Fundi were not visualized. Vision acuity and fields seem normal. Extraocular moments are full range without nystagmus. Mild left lower facial weakness. Tongue midline. Motor system exam mild left upper and lower extremity drift. Mild weakness of left grip and intrinsic hand muscles. Orbits right over left upper extremity. 4/5 left upper and lower extremity strength. Coordination slightly slow and impaired on the left compared to the right but not dysmetric likely due to weakness. Touch and pinprick sensation are preserved. Position sense possibly slightly impaired on the left but inconsistent. Deep tendon pulses are symmetric. Left plantar is equivocal right is downgoing. Gait was not tested.   .ASSESSMENT/PLAN Ms. Karys Kwasnik is a 70 y.o. female with history of depression, colon polyps and osteoporosis presenting with expressive aphaisa and confusion. CT shows R frontal ICH.  Stroke:  right frontal lobe ICH secondary to unknown source. Location not typical for a hypertensive etiology. Doubt amyloid given lack of other signs of hemorrhage on imaging. No underlying tumor seen on MRI. Concern for AVM vs aneurysm given family history.   Resultant  Expressive aphasia, mild L hemiparesis  CT R frontal lobe ICH, 20 mL, possible trace SDH  MRI  R  posterior frontal lobe ICH, 34-61 days old. No other blood products seen.  CTA head and neck  No avm/aneurysm  CT venogram no sinus thrombosis  Cerebral angio No definite AVM or aneurysm noted but abnormal right frontopolar branches raises the question of possible small AVM  LDL 84  HgbA1c ordered  SCDs for VTE prophylaxis Diet regular Room service appropriate?: Yes; Fluid consistency:: Thin  No antithrombotic prior to admission, none now due to hemorrhage  Ongoing aggressive stroke risk factor management  Repeat angio in 2-3 months  Therapy recommendations:  CIR  Disposition:  pending   Transfer to the floor  Headache  Secondary to [place]  Controlled with 1 dose tylenol and morphine  Hypertension  BP 163/95 on arrival in setting of neurologic symptoms  Stable this am at 120/72  No hx of HTN. On no home medications  Doubt hypertensive cause of ICH  Hyperglycemia  HgbA1c 5.3 , goal < 7.0  Other Stroke Risk Factors  Advanced age  ETOH use, advised to drink no more than 1 drink(s) a day  Family hx stroke (maternal aunt w/ hemorrhage, paternal grandmother)  Other Active Problems  Hx depression  Routinely gives blood  Hospital day # 3    Personally examined patient and images, and have participated in and made any corrections needed to history, physical, neuro exam,assessment and plan as stated above.  I have personally obtained the history, evaluated lab date, reviewed imaging studies and agree with radiology interpretations.    Sarina Ill, MD Stroke Neurology [phone] [facility]     To contact Stroke Continuity provider, please refer to [email or web address] After hours, contact General Neurology

## 2016-03-23 NOTE — Progress Notes (Signed)
Physical Therapy Treatment Patient Details Name: Cynthia [name] MRN: [id number] DOB: [birth year] Today's Date: 03/23/2016    History of Present Illness 70 y.o. female with history of depression, colon polyps and osteoporosis presenting with expressive aphaisa and confusion and left side mild hemiplegia, CT shows R frontal ICH.    PT Comments    Patient seen in conjunction with OT therapist for progression of therapies. Patient with improvements in insight and awareness. Could state that she realizes she is not using her left side but even with awareness could not attend upon command without multi modal cues. Patient continues to required increased +2 physical assist for mobility. Tolerated some in room standing and ambulation with assist. Continue to feel CIR appropriate for disposition. Will follow.  Follow Up Recommendations  CIR;Supervision/Assistance - 24 hour     Equipment Recommendations  Other (comment) (TBD)    Recommendations for Other Services       Precautions / Restrictions Precautions Precautions: Fall Precaution Comments: left inattention Restrictions Weight Bearing Restrictions: No    Mobility  Bed Mobility Overal bed mobility: Needs Assistance       Supine to sit: Mod assist     General bed mobility comments: Pt able to bring LEs to EOB with VCs to look at L leg and bring to EOB. Assist for trunk from supine to sit and to scoot hips out to EOB.  Transfers Overall transfer level: Needs assistance Equipment used: 2 person hand held assist Transfers: Sit to/from Stand Sit to Stand: Min assist;+2 physical assistance         General transfer comment: Min assist +2 for sit to stand from EOB. Mod assist +2 required for functional mobility,  Ambulation/Gait Ambulation/Gait assistance: Mod assist;+2 physical assistance Ambulation Distance (Feet): 12 Feet Assistive device: 2 person hand held assist Gait Pattern/deviations: Step-to pattern;Decreased  stride length;Decreased dorsiflexion - left;Shuffle;Narrow base of support Gait velocity: decreased Gait velocity interpretation: Below normal speed for age/gender General Gait Details: patient with inital cues for advancement of LLE, manual faciliation of weight shift during mobility. +2 assist to maintain upright, cues for attention to Left side.    Stairs            Wheelchair Mobility    Modified Rankin (Stroke Patients Only) Modified Rankin (Stroke Patients Only) Pre-Morbid Rankin Score: No symptoms Modified Rankin: Moderately severe disability     Balance Overall balance assessment: Needs assistance Sitting-balance support: Feet supported;Bilateral upper extremity supported Sitting balance-Leahy Scale: Poor Sitting balance - Comments: min guard for EOB dynamic sitting for functional task performance Postural control: Right lateral lean (improved but continues to demonstrate degree of lean) Standing balance support: Bilateral upper extremity supported Standing balance-Leahy Scale: Poor Standing balance comment: bil hand held assist for support                    Cognition Arousal/Alertness: Awake/alert Behavior During Therapy: Flat affect Overall Cognitive Status: Impaired/Different from baseline Area of Impairment: Attention;Following commands;Safety/judgement;Awareness;Problem solving   Current Attention Level: Focused   Following Commands: Follows one step commands with increased time Safety/Judgement: Decreased awareness of deficits Awareness: Emergent Problem Solving: Slow processing;Decreased initiation;Difficulty sequencing;Requires verbal cues;Requires tactile cues General Comments: Pt with increased awareness to L sided deficits; reports that her brain is just not making the connection to move her L side.    Exercises      General Comments        Pertinent Vitals/Pain Pain Assessment: No/denies pain    Home Living Family/patient expects  to be discharged to:: Private residence Living Arrangements: Spouse/significant other Available Help at Discharge: Family Type of Home: House Home Access: Stairs to enter Entrance Stairs-Rails: None Home Layout: Able to live on main level with bedroom/bathroom Home Equipment: None      Prior Function Level of Independence: Independent      Comments: was out at the [place] this past weekend before event ocurred. Works as Marketing executive.   PT Goals (current goals can now be found in the care plan section) Acute Rehab PT Goals Patient Stated Goal: to go home PT Goal Formulation: With patient/family Time For Goal Achievement: 04/04/16 Potential to Achieve Goals: Good Progress towards PT goals: Progressing toward goals    Frequency  Min 4X/week    PT Plan Current plan remains appropriate    Co-evaluation PT/OT/SLP Co-Evaluation/Treatment: Yes Reason for Co-Treatment: Complexity of the patient's impairments (multi-system involvement);For patient/therapist safety PT goals addressed during session: Mobility/safety with mobility;Balance OT goals addressed during session: ADL's and self-care;Other (comment) (functional mobility)     End of Session Equipment Utilized During Treatment: Gait belt Activity Tolerance: Other (comment) (limited by nausea and vomitting during session) Patient left: in chair;with call bell/phone within reach;with chair alarm set;with family/visitor present     Time: [id number] PT Time Calculation (min) (ACUTE ONLY): 22 min  Charges:  $Therapeutic Activity: 8-22 mins                    G CodesDuncan Dull 04/03/16, 11:42 AM Alben Deeds, PT DPT  [phone]

## 2016-03-23 NOTE — Evaluation (Signed)
Occupational Therapy Evaluation Patient Details Name: Cynthia [name] MRN: [id number] DOB: 12-03-1945 Today's Date: 03/23/2016    History of Present Illness 70 y.o. female with history of depression, colon polyps and osteoporosis presenting with expressive aphaisa and confusion and left side mild hemiplegia, CT shows R frontal ICH.   Clinical Impression   Pt reports she was independent with ADLs PTA. Currently pt overall mod assist +2 for functional mobility and min-mod assist with ADLs with max verbal cues to attend to L side. Pt presenting with L UE weakness, L visual field deficits, and L inattention impacting her independence and safety with ADLs and functional mobility. Recommending CIR level therapies to maximize independence and safety with ADLs and functional mobility prior to return home. Pt would benefit from continued skilled OT to address established goals.    Follow Up Recommendations  CIR;Supervision/Assistance - 24 hour    Equipment Recommendations  Other (comment) (TBD at next venue)    Recommendations for Other Services Rehab consult     Precautions / Restrictions Precautions Precautions: Fall Precaution Comments: left inattention Restrictions Weight Bearing Restrictions: No      Mobility Bed Mobility Overal bed mobility: Needs Assistance       Supine to sit: Mod assist     General bed mobility comments: Pt able to bring LEs to EOB with VCs to look at L leg and bring to EOB. Assist for trunk from supine to sit and to scoot hips out to EOB.  Transfers Overall transfer level: Needs assistance Equipment used: 2 person hand held assist Transfers: Sit to/from Stand Sit to Stand: Min assist;+2 physical assistance         General transfer comment: Min assist +2 for sit to stand from EOB. Mod assist +2 required for functional mobility,    Balance Overall balance assessment: Needs assistance Sitting-balance support: Feet supported;Bilateral upper  extremity supported Sitting balance-Leahy Scale: Poor     Standing balance support: Bilateral upper extremity supported Standing balance-Leahy Scale: Poor Standing balance comment: bil hand held assist for support                            ADL Overall ADL's : Needs assistance/impaired Eating/Feeding: Set up;Sitting   Grooming: Minimal assistance;Sitting;Oral care;Cueing for sequencing Grooming Details (indicate cue type and reason): VCs to visually scan to L side to find toothbrush/toothpaste. Pt able to perform brushing teeth but required max verbal cues for initiation and termination of functional activities. Upper Body Bathing: Minimal assitance;Sitting   Lower Body Bathing: Moderate assistance;Sit to/from stand   Upper Body Dressing : Minimal assistance;Sitting   Lower Body Dressing: Moderate assistance;Sit to/from stand Lower Body Dressing Details (indicate cue type and reason): Pt attempted to don sock with VCs to use L hand to assist. Pt unable to get sock started on foot. Toilet Transfer: Moderate assistance;+2 for physical assistance;BSC (2 person hand held assist) Toilet Transfer Details (indicate cue type and reason): Simulated by sit to stand from EOB and short distance functional mobility in room. Toileting- Clothing Manipulation and Hygiene: Moderate assistance;Sit to/from stand       Functional mobility during ADLs: Moderate assistance;+2 for physical assistance General ADL Comments: Pt with L inattention requiring max multimodal cues to initiation of L side.     Vision Vision Assessment?: Yes;Vision impaired- to be further tested in functional context Ocular Range of Motion: Restricted on the left Alignment/Gaze Preference: Gaze right Tracking/Visual Pursuits: Decreased smoothness of horizontal tracking;Decreased smoothness  of vertical tracking (difficulty tracking past midline on L, able to with VCs) Visual Fields: Left visual field deficit;Left  homonymous hemianopsia   Perception     Praxis      Pertinent Vitals/Pain Pain Assessment: No/denies pain     Hand Dominance Right   Extremity/Trunk Assessment Upper Extremity Assessment Upper Extremity Assessment: LUE deficits/detail LUE Deficits / Details: AROM overall WFL. Strength: Shoulder flex/ext 4/5, elbow flex/ext 3+5, decreased grip strength. Maintaining flexed posture intermittently. LUE Coordination: decreased fine motor;decreased gross motor   Lower Extremity Assessment Lower Extremity Assessment: Defer to PT evaluation   Cervical / Trunk Assessment Cervical / Trunk Assessment: Normal   Communication Communication Communication: Expressive difficulties   Cognition Arousal/Alertness: Awake/alert Behavior During Therapy: Flat affect Overall Cognitive Status: Impaired/Different from baseline Area of Impairment: Attention;Following commands;Safety/judgement;Awareness;Problem solving   Current Attention Level: Focused   Following Commands: Follows one step commands with increased time Safety/Judgement: Decreased awareness of deficits Awareness: Emergent Problem Solving: Slow processing;Decreased initiation;Difficulty sequencing;Requires verbal cues;Requires tactile cues General Comments: Pt with increased awareness to L sided deficits; reports that her brain is just not making the connection to move her L side.   General Comments       Exercises       Shoulder Instructions      Home Living Family/patient expects to be discharged to:: Private residence Living Arrangements: Spouse/significant other Available Help at Discharge: Family Type of Home: House Home Access: Stairs to enter Technical brewer of Steps: 4 Entrance Stairs-Rails: None Home Layout: Able to live on main level with bedroom/bathroom     Bathroom Shower/Tub: Teacher, early years/pre: Standard     Home Equipment: None          Prior Functioning/Environment Level  of Independence: Independent        Comments: was out at the [place] this past weekend before event ocurred. Works as Marketing executive.    OT Diagnosis: Generalized weakness;Cognitive deficits;Disturbance of vision;Hemiplegia non-dominant side   OT Problem List: Decreased strength;Decreased activity tolerance;Impaired balance (sitting and/or standing);Impaired vision/perception;Decreased coordination;Decreased cognition;Decreased safety awareness;Decreased knowledge of use of DME or AE;Decreased knowledge of precautions;Impaired UE functional use   OT Treatment/Interventions: Self-care/ADL training;Therapeutic exercise;Neuromuscular education;Energy conservation;DME and/or AE instruction;Therapeutic activities;Cognitive remediation/compensation;Visual/perceptual remediation/compensation;Patient/family education;Balance training    OT Goals(Current goals can be found in the care plan section) Acute Rehab OT Goals Patient Stated Goal: to go home OT Goal Formulation: With patient Time For Goal Achievement: 04/06/16 Potential to Achieve Goals: Good ADL Goals Pt Will Perform Grooming: with modified independence;standing Pt Will Perform Upper Body Bathing: with modified independence;sitting;standing Pt Will Perform Lower Body Bathing: with modified independence;sit to/from stand Pt Will Transfer to Toilet: with modified independence;ambulating;regular height toilet Pt Will Perform Toileting - Clothing Manipulation and hygiene: with modified independence;sit to/from stand Additional ADL Goal #1: Pt will attend to stimulus on L side 100% of the time with min verbal cues.  OT Frequency: Min 2X/week   Barriers to D/C:            Co-evaluation PT/OT/SLP Co-Evaluation/Treatment: Yes Reason for Co-Treatment: Complexity of the patient's impairments (multi-system involvement);For patient/therapist safety   OT goals addressed during session: ADL's and self-care;Other (comment)  (functional mobility)      End of Session Equipment Utilized During Treatment: Gait belt  Activity Tolerance: Patient tolerated treatment well Patient left: in chair;with call bell/phone within reach;with chair alarm set   Time: [id number] OT Time Calculation (min): 29 min Charges:  OT General Charges $  OT Visit: 1 Procedure OT Evaluation $OT Eval Moderate Complexity: 1 Procedure G-Codes:     Binnie Kand M.S., OTR/L Pager: [phone]  03/23/2016, 11:27 AM

## 2016-03-23 NOTE — Progress Notes (Signed)
Inpatient Rehabilitation  Met with patient to discuss team's recommendation for IP Rehab.  Shared booklets and answered questions.  Patient wishes to regain her independence and is interested in [facility] following her acute care.  I will initiate insurance authorization.  Plan to follow for timing of medical readiness, insurance authorization, and bed availability.  Please call with questions.   Carmelia Roller., CCC/SLP Admission Coordinator  [facility]  Cell [phone]

## 2016-03-23 NOTE — Progress Notes (Signed)
Patient is transferred from [place]. Patient is alert and oriented x 4. Patient oriented to room and and made comfortable.

## 2016-03-24 ENCOUNTER — Inpatient Hospital Stay (HOSPITAL_COMMUNITY)
Admission: RE | Admit: 2016-03-24 | Discharge: 2016-04-07 | DRG: 056 | Disposition: A | Discharge status: Home / Self Care | Payer: Medicare Other | Source: Intra-hospital | Attending: Physical Medicine & Rehabilitation | Admitting: Physical Medicine & Rehabilitation

## 2016-03-24 DIAGNOSIS — K5900 Constipation, unspecified: Secondary | ICD-10-CM | POA: Diagnosis present

## 2016-03-24 DIAGNOSIS — I69319 Unspecified symptoms and signs involving cognitive functions following cerebral infarction: Secondary | ICD-10-CM

## 2016-03-24 DIAGNOSIS — I619 Nontraumatic intracerebral hemorrhage, unspecified: Secondary | ICD-10-CM | POA: Diagnosis present

## 2016-03-24 DIAGNOSIS — F419 Anxiety disorder, unspecified: Secondary | ICD-10-CM | POA: Diagnosis present

## 2016-03-24 DIAGNOSIS — R41844 Frontal lobe and executive function deficit: Secondary | ICD-10-CM | POA: Diagnosis not present

## 2016-03-24 DIAGNOSIS — R4701 Aphasia: Secondary | ICD-10-CM | POA: Diagnosis present

## 2016-03-24 DIAGNOSIS — E876 Hypokalemia: Secondary | ICD-10-CM | POA: Diagnosis present

## 2016-03-24 DIAGNOSIS — G936 Cerebral edema: Secondary | ICD-10-CM | POA: Diagnosis present

## 2016-03-24 DIAGNOSIS — I610 Nontraumatic intracerebral hemorrhage in hemisphere, subcortical: Secondary | ICD-10-CM | POA: Diagnosis not present

## 2016-03-24 DIAGNOSIS — I69254 Hemiplegia and hemiparesis following other nontraumatic intracranial hemorrhage affecting left non-dominant side: Principal | ICD-10-CM

## 2016-03-24 DIAGNOSIS — F329 Major depressive disorder, single episode, unspecified: Secondary | ICD-10-CM | POA: Diagnosis present

## 2016-03-24 DIAGNOSIS — Z79899 Other long term (current) drug therapy: Secondary | ICD-10-CM

## 2016-03-24 DIAGNOSIS — I69220 Aphasia following other nontraumatic intracranial hemorrhage: Secondary | ICD-10-CM | POA: Diagnosis not present

## 2016-03-24 DIAGNOSIS — Z9109 Other allergy status, other than to drugs and biological substances: Secondary | ICD-10-CM | POA: Diagnosis not present

## 2016-03-24 DIAGNOSIS — Z880 Allergy status to penicillin: Secondary | ICD-10-CM | POA: Diagnosis not present

## 2016-03-24 DIAGNOSIS — R414 Neurologic neglect syndrome: Secondary | ICD-10-CM | POA: Diagnosis not present

## 2016-03-24 DIAGNOSIS — G8194 Hemiplegia, unspecified affecting left nondominant side: Secondary | ICD-10-CM | POA: Diagnosis not present

## 2016-03-24 DIAGNOSIS — I615 Nontraumatic intracerebral hemorrhage, intraventricular: Secondary | ICD-10-CM | POA: Diagnosis not present

## 2016-03-24 DIAGNOSIS — W11XXXD Fall on and from ladder, subsequent encounter: Secondary | ICD-10-CM

## 2016-03-24 DIAGNOSIS — M810 Age-related osteoporosis without current pathological fracture: Secondary | ICD-10-CM | POA: Diagnosis present

## 2016-03-24 LAB — GLUCOSE, CAPILLARY
GLUCOSE-CAPILLARY: 101 mg/dL — AB (ref 65–99)
Glucose-Capillary: 101 mg/dL — ABNORMAL HIGH (ref 65–99)
Glucose-Capillary: 91 mg/dL (ref 65–99)

## 2016-03-24 MED ORDER — ALPRAZOLAM 0.25 MG PO TABS
0.2500 mg | ORAL_TABLET | Freq: Two times a day (BID) | ORAL | Status: DC | PRN
  Administered 2016-03-24 – 2016-04-06 (×15): 0.25 mg via ORAL
  Filled 2016-03-24 (×15): qty 1

## 2016-03-24 MED ORDER — ONDANSETRON HCL 4 MG PO TABS: 4.0000 mg | ORAL_TABLET | Freq: Four times a day (QID) | ORAL | Status: DC | PRN

## 2016-03-24 MED ORDER — ACETAMINOPHEN 650 MG RE SUPP: 650.0000 mg | RECTAL | Status: DC | PRN

## 2016-03-24 MED ORDER — FAMOTIDINE 20 MG PO TABS
20.0000 mg | ORAL_TABLET | Freq: Two times a day (BID) | ORAL | Status: DC
  Administered 2016-03-24 – 2016-04-07 (×28): 20 mg via ORAL
  Filled 2016-03-24 (×28): qty 1

## 2016-03-24 MED ORDER — SORBITOL 70 % SOLN: 30.0000 mL | Freq: Every day | Status: DC | PRN

## 2016-03-24 MED ORDER — ONDANSETRON HCL 4 MG/2ML IJ SOLN: 4.0000 mg | Freq: Four times a day (QID) | INTRAMUSCULAR | Status: DC | PRN

## 2016-03-24 MED ORDER — ACETAMINOPHEN 325 MG PO TABS
650.0000 mg | ORAL_TABLET | ORAL | Status: DC | PRN
  Administered 2016-03-29 – 2016-04-03 (×3): 650 mg via ORAL
  Filled 2016-03-24 (×3): qty 2

## 2016-03-24 NOTE — H&P (Signed)
Physical Medicine and Rehabilitation Admission H&P   Chief Complaint  Patient presents with  . Stroke Symptoms  : HPI: Cynthia [name] is a 71 y.o. right handed female admitted 03/20/2016 with headache, left-sided weakness, and altered mental status and aphasia. There was a report of a fall from a first step of a ladder several days ago. Patient lives with husband in [place]. Independent prior to admission. 2 level home with bedroom on first floor. Husband can assist as needed. CT of the head showed acute anterior right frontal lobe intra-axial hemorrhage estimated hemorrhage volume 20 mL. Surrounding edema with mild regional mass effect. MRI of the brain showed late acute early subacute intraparenchymal hemorrhage in the right posterior frontal lobe with layering and surrounding edema. CTA of head and neck shows right frontal lobe bilobed hematoma spanning 3.2 x 4.3 x 3.7 cm with surrounding vasogenic edema and mass effect upon the right ventricle appearing similar to recent MRI CT.Angiogram of head and neck showed no significant stenosis of either vertebral artery or AVM. Marland Kitchen Maintain on a regular diet. Physical therapy evaluation completed 03/21/2016 with recommendations of physical medicine rehabilitation consult  ROS Constitutional: Negative for fever and chills.  HENT: Negative for hearing loss.  Eyes: Negative for blurred vision and double vision.  Respiratory: Negative for cough and shortness of breath.  Cardiovascular: Negative for chest pain, palpitations and leg swelling.  Gastrointestinal: Positive for constipation. Negative for nausea and vomiting.  Genitourinary: Negative for dysuria and hematuria.  Musculoskeletal: Positive for myalgias.  Neurological: Positive for speech change, weakness and headaches. Negative for seizures.  Psychiatric/Behavioral: Positive for depression.  All other systems reviewed and are negative   Past Medical  History  Diagnosis Date  . Colon polyps   . Osteoporosis   . Depression     history of   History reviewed. No pertinent past surgical history. Family History  Problem Relation Age of Onset  . Heart disease Mother   . Cancer Mother     lung   Social History:  reports that she has never smoked. She has never used smokeless tobacco. She reports that she does not drink alcohol or use illicit drugs. Allergies:  Allergies  Allergen Reactions  . Other Anaphylaxis    FIRE ANTS  . Penicillins Rash    Has patient had a PCN reaction causing immediate rash, facial/tongue/throat swelling, SOB or lightheadedness with hypotension: Unknown Has patient had a PCN reaction causing severe rash involving mucus membranes or skin necrosis: No Has patient had a PCN reaction that required hospitalization No Has patient had a PCN reaction occurring within the last 10 years: No If all of the above answers are "NO", then may proceed with Cephalosporin use.    Medications Prior to Admission  Medication Sig Dispense Refill  . ALPRAZolam (XANAX) 0.25 MG tablet TAKE 1 TABLET 3 TIMES DAILY AS NEEDED FOR ANXIETY (Patient taking differently: TAKE 1 TABLET 2 TIMES DAILY AS NEEDED FOR ANXIETY) 90 tablet 0  . Calcium Carbonate-Vitamin D (OS-CAL 500 + D PO) Take 1 tablet by mouth daily.     . meloxicam (MOBIC) 15 MG tablet TAKE 1 TABLET BY MOUTH EVERY DAY (Patient taking differently: TAKE 1 TABLET (15 mg) BY MOUTH EVERY DAY as needed for lower back pain) 30 tablet 2  . cetirizine (ZYRTEC) 10 MG tablet Take 10 mg by mouth daily as needed for allergies.     Marland Kitchen EPIPEN 2-PAK 0.3 MG/0.3ML SOAJ injection INJECT 0.3 MLS (0.3 MG TOTAL) INTO  THE MUSCLE ONCE. 2 Device 0  . ferrous sulfate 325 (65 FE) MG tablet Take 325 mg by mouth daily as needed (to improve hemoglobin).       Home: Home Living Family/patient expects to be discharged to:: Private  residence Living Arrangements: Spouse/significant other Available Help at Discharge: Family Type of Home: House Home Access: Stairs to enter Technical brewer of Steps: 4 Entrance Stairs-Rails: None Home Layout: Able to live on main level with bedroom/bathroom Bathroom Shower/Tub: Chiropodist: Standard Home Equipment: None Lives With: Spouse  Functional History: Prior Function Level of Independence: Independent Comments: was out at the [place] this past weekend before event ocurred. Works as Marketing executive.  Functional Status:  Mobility: Bed Mobility Overal bed mobility: Needs Assistance Bed Mobility: Supine to Sit Supine to sit: Mod assist General bed mobility comments: Pt able to bring LEs to EOB with VCs to look at L leg and bring to EOB. Assist for trunk from supine to sit and to scoot hips out to EOB. Transfers Overall transfer level: Needs assistance Equipment used: 2 person hand held assist Transfers: Sit to/from Stand Sit to Stand: Min assist, +2 physical assistance Stand pivot transfers: Mod assist, +2 physical assistance General transfer comment: Min assist +2 for sit to stand from EOB. Mod assist +2 required for functional mobility, Ambulation/Gait Ambulation/Gait assistance: Mod assist, +2 physical assistance Ambulation Distance (Feet): 12 Feet Assistive device: 2 person hand held assist Gait Pattern/deviations: Step-to pattern, Decreased stride length, Decreased dorsiflexion - left, Shuffle, Narrow base of support General Gait Details: patient with inital cues for advancement of LLE, manual faciliation of weight shift during mobility. +2 assist to maintain upright, cues for attention to Left side.  Gait velocity: decreased Gait velocity interpretation: Below normal speed for age/gender    ADL: ADL Overall ADL's : Needs assistance/impaired Eating/Feeding: Set up, Sitting Grooming: Minimal assistance, Sitting,  Oral care, Cueing for sequencing Grooming Details (indicate cue type and reason): VCs to visually scan to L side to find toothbrush/toothpaste. Pt able to perform brushing teeth but required max verbal cues for initiation and termination of functional activities. Upper Body Bathing: Minimal assitance, Sitting Lower Body Bathing: Moderate assistance, Sit to/from stand Upper Body Dressing : Minimal assistance, Sitting Lower Body Dressing: Moderate assistance, Sit to/from stand Lower Body Dressing Details (indicate cue type and reason): Pt attempted to don sock with VCs to use L hand to assist. Pt unable to get sock started on foot. Toilet Transfer: Moderate assistance, +2 for physical assistance, BSC (2 person hand held assist) Toilet Transfer Details (indicate cue type and reason): Simulated by sit to stand from EOB and short distance functional mobility in room. Toileting- Clothing Manipulation and Hygiene: Moderate assistance, Sit to/from stand Functional mobility during ADLs: Moderate assistance, +2 for physical assistance General ADL Comments: Pt with L inattention requiring max multimodal cues to initiation of L side.  Cognition: Cognition Overall Cognitive Status: Impaired/Different from baseline Arousal/Alertness: Awake/alert Orientation Level: Oriented X4 Attention: Sustained Sustained Attention: Appears intact Memory: Appears intact Awareness: Impaired Awareness Impairment: Emergent impairment, Anticipatory impairment, Intellectual impairment Problem Solving: Appears intact (verbal) Safety/Judgment: Impaired Cognition Arousal/Alertness: Awake/alert Behavior During Therapy: Flat affect Overall Cognitive Status: Impaired/Different from baseline Area of Impairment: Attention, Following commands, Safety/judgement, Awareness, Problem solving Current Attention Level: Focused Following Commands: Follows one step commands with increased time Safety/Judgement: Decreased awareness of  deficits Awareness: Emergent Problem Solving: Slow processing, Decreased initiation, Difficulty sequencing, Requires verbal cues, Requires tactile cues General Comments: Pt  with increased awareness to L sided deficits; reports that her brain is just not making the connection to move her L side.  Physical Exam: Blood pressure 166/66, pulse 64, temperature 98.9 F (37.2 C), temperature source Oral, resp. rate 14, height _0  (1.676 m), weight 69.6 kg (153 lb 7 oz), SpO2 98 %. Physical Exam Constitutional: She is oriented to person, place, and time. She appears well-developed and well-nourished.  HENT:  Head: Normocephalic and atraumatic.  Eyes:  Pupils reactive to light Right gaze preference  Neck: Normal range of motion. Neck supple. No thyromegaly present.  Cardiovascular: Normal rate and regular rhythm.  Respiratory: Effort normal and breath sounds normal. No respiratory distress.  GI: Soft. Bowel sounds are normal. She exhibits no distension.  Musculoskeletal: She exhibits no edema or tenderness.  Neurological: She is alert and oriented to person, place, and time.  Mood is flat.  Followed simple commands. Right gaze preferenceSensation intact to light touch DTRs symmetric Motor: RUE: 5/5 proximal to distal RLE: 4+/5 proximal to distal LUE: 0/5 LLE: 2 minus/5 hip and knee extensor synergy, trace toe flexion absent  Ankle dorsiflexion Sensation intact to light touch in bilateral upper and lower limbs  Skin: Skin is warm and dry    Lab Results Last 48 Hours    Results for orders placed or performed during the hospital encounter of 03/20/16 (from the past 48 hour(s))  Glucose, capillary Status: Abnormal   Collection Time: 03/21/16 10:27 PM  Result Value Ref Range   Glucose-Capillary 116 (H) 65 - 99 mg/dL  Hemoglobin A1c Status: None   Collection Time: 03/22/16 2:55 AM  Result Value Ref Range   Hgb A1c MFr Bld 5.3 4.8 - 5.6 %     Comment: (NOTE)  Pre-diabetes: 5.7 - 6.4  Diabetes: >6.4  Glycemic control for adults with diabetes: <7.0    Mean Plasma Glucose 105 mg/dL    Comment: (NOTE) Performed At: [facility] [place], [place] [id number] Lindon Romp MD RW:[id number]   Basic metabolic panel Status: Abnormal   Collection Time: 03/22/16 2:55 AM  Result Value Ref Range   Sodium 131 (L) 135 - 145 mmol/L   Potassium 3.6 3.5 - 5.1 mmol/L   Chloride 100 (L) 101 - 111 mmol/L   CO2 24 22 - 32 mmol/L   Glucose, Bld 107 (H) 65 - 99 mg/dL   BUN 7 6 - 20 mg/dL   Creatinine, Ser 0.63 0.44 - 1.00 mg/dL   Calcium 8.6 (L) 8.9 - 10.3 mg/dL   GFR calc non Af Amer >60 >60 mL/min   GFR calc Af Amer >60 >60 mL/min    Comment: (NOTE) The eGFR has been calculated using the CKD EPI equation. This calculation has not been validated in all clinical situations. eGFR's persistently <60 mL/min signify possible Chronic Kidney Disease.    Anion gap 7 5 - 15  CBC Status: Abnormal   Collection Time: 03/22/16 2:55 AM  Result Value Ref Range   WBC 8.8 4.0 - 10.5 K/uL   RBC 3.97 3.87 - 5.11 MIL/uL   Hemoglobin 11.9 (L) 12.0 - 15.0 g/dL   HCT 35.1 (L) 36.0 - 46.0 %   MCV 88.4 78.0 - 100.0 fL   MCH 30.0 26.0 - 34.0 pg   MCHC 33.9 30.0 - 36.0 g/dL   RDW 12.5 11.5 - 15.5 %   Platelets 253 150 - 400 K/uL  Glucose, capillary Status: Abnormal   Collection Time: 03/22/16 12:05 PM  Result  Value Ref Range   Glucose-Capillary 107 (H) 65 - 99 mg/dL   Comment 1 Notify RN    Comment 2 Document in Chart   Glucose, capillary Status: Abnormal   Collection Time: 03/22/16 4:29 PM  Result Value Ref Range   Glucose-Capillary 113 (H) 65 - 99 mg/dL   Comment 1 Notify RN    Comment 2 Document in Chart   Glucose, capillary Status:  Abnormal   Collection Time: 03/23/16 8:23 AM  Result Value Ref Range   Glucose-Capillary 104 (H) 65 - 99 mg/dL  Glucose, capillary Status: Abnormal   Collection Time: 03/23/16 11:34 AM  Result Value Ref Range   Glucose-Capillary 119 (H) 65 - 99 mg/dL      Imaging Results (Last 48 hours)    Ir Angio Intra Extracran Sel Com Carotid Innominate Bilat Mod Sed  03/23/2016 CLINICAL DATA: Headaches. Left-sided weakness. Right gaze deviation. Intracranial hemorrhage on CT scan of the brain. EXAM: BILATERAL COMMON CAROTID AND INNOMINATE ANGIOGRAPHY AND BILATERAL VERTEBRAL ARTERY ANGIOGRAMS PROCEDURE: Contrast: 60 mL ISOVUE-300 IOPAMIDOL (ISOVUE-300) INJECTION 61% Anesthesia/Sedation: Conscious sedation. Medications: Fentanyl 25 mcg IV. Following a full explanation of the procedure along with the potential associated complications, an informed witnessed consent was obtained. The right groin was prepped and draped in the usual sterile fashion. Thereafter using modified Seldinger technique, transfemoral access into the right common femoral artery was obtained without difficulty. Over a 0.035 inch guidewire, a 5 French Pinnacle sheath was inserted. Through this, and also over 0.035 inch guidewire, a 5 [place] JB 1 catheter was advanced to the aortic arch region and selectively positioned in the right common carotid artery, the right vertebral artery, the left common carotid artery and the left vertebral artery. There were no acute complications. The patient tolerated the procedure well. FINDINGS: The right vertebral artery origin is normal. The vessel is seen to opacify normally to the cranial skull base. Mild FMD-like changes are seen involving the vertical segment of the right vertebral artery at C1. Normal opacification is seen of the right posterior-inferior cerebellar artery and the right vertebrobasilar junction. The opacified portion of the basilar artery, the left posterior cerebral  artery, superior cerebellar arteries and the anterior-inferior cerebellar arteries is normal into the capillary and the venous phases. The right common carotid arteriogram demonstrates the right external carotid artery and its major branches to be widely patent. The right internal carotid artery at the bulb to the cranial skull base opacifies normally. The petrous, cavernous and the supraclinoid segments are widely patent. There is mild broad-based eccentric dilatation of the superior hypophyseal right ICA. A dominant right posterior cerebral artery is seen opacifying the right posterior cerebral artery distribution. The right middle cerebral artery and the right anterior cerebral artery are seen to opacify into the capillary and the venous phases. Mild mass-effect from right to left is seen of the right anterior cerebral artery A2 segment. A prominent focal area of hypoperfusion is seen involving the right frontal lobe with delayed capillary filling. There is mild to moderate abnormal prominence of the frontal orbital branch of the right anterior cerebral artery. The venous phase demonstrates no abnormal stasis of contrast within the cortical cerebral veins. The dural venous sinuses are widely patent. The left common carotid arteriogram demonstrates the left external carotid artery and its major branches to be widely patent. The left internal carotid artery at the bulb to the cranial skull base opacifies normally. The petrous, cavernous and the supraclinoid segments are widely patent. There is mild  focal outpouching of the right internal carotid artery superior hypophyseal region ICA. Flash filling of the left posterior cerebral artery via the left posterior communicating artery is seen. The left middle and the left anterior cerebral arteries opacify normally into the capillary and the venous phases. Cross opacification via the anterior communicating artery of the right anterior cerebral artery A2 segment is seen.  The left vertebral artery origin is normal. The vessel is seen to opacify normally to the cranial skull base. Mild FMD-like changes are seen involving the posterior aspect of the vertical segment of the left vertebral artery at the level of C1-C2. Distal to this, the left vertebrobasilar junction and the left posterior-inferior cerebellar arteries are seen to opacify normally. Wide patency is seen of the basilar artery, the left posterior cerebral artery, superior cerebellar arteries and the anterior-inferior cerebellar arteries into the capillary and the venous phases. Non-opacified blood is seen in the proximal basilar artery from the contralateral vertebral artery. Nonvisualization of the right posterior cerebral arteries related to prominent inflow from the anterior circulation via the posterior communicating artery as mentioned above. IMPRESSION: Angiographically no evidence of arteriovenous shunting, intracranial aneurysms, or arteriovenous malformation is seen on this study. Prominent area of hypoperfusion involving the right frontal lobe anteriorly with mass effect from right to left most likely related to the intracerebral hemorrhage seen on the CT scan. Prominent frontal polar branch of the right anterior cerebral artery A1 segment extending into the anterior inferior portion of the larger area of hyperperfusion. This could signify a potential feeder to a vascular malformation not angiographically visualized on today's study. Electronically Signed By: Luanne Bras M.D. On: 03/22/2016 10:26   Ir Angio Vertebral Sel Vertebral Bilat Mod Sed  03/23/2016 CLINICAL DATA: Headaches. Left-sided weakness. Right gaze deviation. Intracranial hemorrhage on CT scan of the brain. EXAM: BILATERAL COMMON CAROTID AND INNOMINATE ANGIOGRAPHY AND BILATERAL VERTEBRAL ARTERY ANGIOGRAMS PROCEDURE: Contrast: 60 mL ISOVUE-300 IOPAMIDOL (ISOVUE-300) INJECTION 61% Anesthesia/Sedation: Conscious sedation. Medications:  Fentanyl 25 mcg IV. Following a full explanation of the procedure along with the potential associated complications, an informed witnessed consent was obtained. The right groin was prepped and draped in the usual sterile fashion. Thereafter using modified Seldinger technique, transfemoral access into the right common femoral artery was obtained without difficulty. Over a 0.035 inch guidewire, a 5 French Pinnacle sheath was inserted. Through this, and also over 0.035 inch guidewire, a 5 [place] JB 1 catheter was advanced to the aortic arch region and selectively positioned in the right common carotid artery, the right vertebral artery, the left common carotid artery and the left vertebral artery. There were no acute complications. The patient tolerated the procedure well. FINDINGS: The right vertebral artery origin is normal. The vessel is seen to opacify normally to the cranial skull base. Mild FMD-like changes are seen involving the vertical segment of the right vertebral artery at C1. Normal opacification is seen of the right posterior-inferior cerebellar artery and the right vertebrobasilar junction. The opacified portion of the basilar artery, the left posterior cerebral artery, superior cerebellar arteries and the anterior-inferior cerebellar arteries is normal into the capillary and the venous phases. The right common carotid arteriogram demonstrates the right external carotid artery and its major branches to be widely patent. The right internal carotid artery at the bulb to the cranial skull base opacifies normally. The petrous, cavernous and the supraclinoid segments are widely patent. There is mild broad-based eccentric dilatation of the superior hypophyseal right ICA. A dominant right posterior cerebral artery is  seen opacifying the right posterior cerebral artery distribution. The right middle cerebral artery and the right anterior cerebral artery are seen to opacify into the capillary and the venous  phases. Mild mass-effect from right to left is seen of the right anterior cerebral artery A2 segment. A prominent focal area of hypoperfusion is seen involving the right frontal lobe with delayed capillary filling. There is mild to moderate abnormal prominence of the frontal orbital branch of the right anterior cerebral artery. The venous phase demonstrates no abnormal stasis of contrast within the cortical cerebral veins. The dural venous sinuses are widely patent. The left common carotid arteriogram demonstrates the left external carotid artery and its major branches to be widely patent. The left internal carotid artery at the bulb to the cranial skull base opacifies normally. The petrous, cavernous and the supraclinoid segments are widely patent. There is mild focal outpouching of the right internal carotid artery superior hypophyseal region ICA. Flash filling of the left posterior cerebral artery via the left posterior communicating artery is seen. The left middle and the left anterior cerebral arteries opacify normally into the capillary and the venous phases. Cross opacification via the anterior communicating artery of the right anterior cerebral artery A2 segment is seen. The left vertebral artery origin is normal. The vessel is seen to opacify normally to the cranial skull base. Mild FMD-like changes are seen involving the posterior aspect of the vertical segment of the left vertebral artery at the level of C1-C2. Distal to this, the left vertebrobasilar junction and the left posterior-inferior cerebellar arteries are seen to opacify normally. Wide patency is seen of the basilar artery, the left posterior cerebral artery, superior cerebellar arteries and the anterior-inferior cerebellar arteries into the capillary and the venous phases. Non-opacified blood is seen in the proximal basilar artery from the contralateral vertebral artery. Nonvisualization of the right posterior cerebral arteries related to  prominent inflow from the anterior circulation via the posterior communicating artery as mentioned above. IMPRESSION: Angiographically no evidence of arteriovenous shunting, intracranial aneurysms, or arteriovenous malformation is seen on this study. Prominent area of hypoperfusion involving the right frontal lobe anteriorly with mass effect from right to left most likely related to the intracerebral hemorrhage seen on the CT scan. Prominent frontal polar branch of the right anterior cerebral artery A1 segment extending into the anterior inferior portion of the larger area of hyperperfusion. This could signify a potential feeder to a vascular malformation not angiographically visualized on today's study. Electronically Signed By: Luanne Bras M.D. On: 03/22/2016 10:26        Medical Problem List and Plan: 1. Left hemiplegia secondary to right frontal ICH 2. DVT Prophylaxis/Anticoagulation: SCDs. Monitor for signs of DVT 3. Pain Management: Tylenol as needed 4. Mood: Xanax 0.25 mg twice daily as needed for 5. Neuropsych: This patient is capable of making decisions on her own behalf. 6. Skin/Wound Care: Routine skin checks 7. Fluids/Electrolytes/Nutrition: Routine I&O's with follow-up chemistries      Post Admission Physician Evaluation: 1. Functional deficits secondary to Left hemiplegia secondary to right frontal ICH. 2. Patient is admitted to receive collaborative, interdisciplinary care between the physiatrist, rehab nursing staff, and therapy team. 3. Patient's level of medical complexity and substantial therapy needs in context of that medical necessity cannot be provided at a lesser intensity of care such as a SNF. 4. Patient has experienced substantial functional loss from his/her baseline which was documented above under the "Functional History" and "Functional Status" headings. Judging by the patient's diagnosis,  physical exam, and functional history, the patient has  potential for functional progress which will result in measurable gains while on inpatient rehab. These gains will be of substantial and practical use upon discharge in facilitating mobility and self-care at the household level. 5. Physiatrist will provide 24 hour management of medical needs as well as oversight of the therapy plan/treatment and provide guidance as appropriate regarding the interaction of the two. 6. 24 hour rehab nursing will assist with bladder management, bowel management, safety, skin/wound care, disease management, medication administration, pain management and patient education and help integrate therapy concepts, techniques,education, etc. 7. PT will assess and treat for/with: pre gait, gait training, endurance , safety, equipment, neuromuscular re education. Goals are: minA. 8. OT will assess and treat for/with: ADLs, Cognitive perceptual skills, Neuromuscular re education, safety, endurance, equipment. Goals are: Min A. Therapy may proceed with showering this patient. 9. SLP will assess and treat for/with: memory, attention, problem solving, thought organization. Goals are: Min assist. 10. Case Management and Social Worker will assess and treat for psychological issues and discharge planning. 11. Team conference will be held weekly to assess progress toward goals and to determine barriers to discharge. 12. Patient will receive at least 3 hours of therapy per day at least 5 days per week. 13. ELOS: 18-21 days  14. Prognosis: good     Charlett Blake M.D. [facility] FAAPM&R (Sports Med, Neuromuscular Med) Diplomate Am Board of Electrodiagnostic Med  03/23/2016

## 2016-03-24 NOTE — PMR Pre-admission (Signed)
PMR Admission Coordinator Pre-Admission Assessment  Patient: Cynthia [name] is an 70 y.o., female MRN: [id number] DOB: November 22, 1945 Height: 5\' 6"  (167.6 cm) Weight: 69.6 kg (153 lb 7 oz)              Insurance Information HMO:     PPO: X     PCP:      IPA:      80/20:      OTHER:  PRIMARY: [employer] Medicare       Policy#: [id number]      Subscriber: Self  CM Name: Cynthia [name]      Phone#: [phone]     Fax#: [phone] Pre-Cert#: [id number]      Employer: retired  Benefits:  Phone #: [phone]     Name: Cynthia [name]. Date: 01/11/16     Deduct: $0      Out of Pocket Max: $4,000      Life Max: None CIR: $160 days 1-10, $0 days 11-999      SNF: $0 days 1-20, $5 days 21-100 Outpatient: PT/OT/SLP     Co-Pay: $20 Home Health: PT/PT/SLP      Co-Pay: $0 DME: 80%     Co-Pay: 20%  Providers: in network   Medicaid Application Date:       Case Manager:  Disability Application Date:       Case Worker:   Emergency Facilities manager Information    Name Relation Home Work [place]  [phone]     Cynthia, [name] [phone]       Current Medical History  Patient Admitting Diagnosis: Right frontal ICH  History of Present Illness: Cynthia [name] is a 70 y.o. right handed female admitted 03/20/2016 with headache, left-sided weakness, and altered mental status and aphasia. There was a report of a fall from a first step of a ladder several days ago. Patient lives with husband in [place]. Independent prior to admission. 2 level home with bedroom on first floor. Husband can assist as needed. CT of the head showed acute anterior right frontal lobe intra-axial hemorrhage estimated hemorrhage volume 20 mL. Surrounding edema with mild regional mass effect. MRI of the brain showed late acute early subacute intraparenchymal hemorrhage in the right posterior frontal lobe with layering and surrounding edema. CTA of head and neck shows right frontal lobe bilobed  hematoma spanning 3.2 x 4.3 x 3.7 cm with surrounding vasogenic edema and mass effect upon the right ventricle appearing similar to recent MRI CT.Angiogram of head and neck showed no significant stenosis of either vertebral artery or AVM. Maintain on a regular diet. Physical therapy evaluation completed 03/21/2016 with recommendations of physical medicine rehabilitation consult   NIH Total: 3    Past Medical History  Past Medical History  Diagnosis Date  . Colon polyps   . Osteoporosis   . Depression     history of    Family History  family history includes Cancer in her mother; Heart disease in her mother.  Prior Rehab/Hospitalizations:  Has the patient had major surgery during 100 days prior to admission? No  Current Medications   Current facility-administered medications:  .   stroke: mapping our early stages of recovery book, , Does not apply, Once, Cynthia Reach, MD .  0.9 %  sodium chloride infusion, , Intravenous, Continuous, Cynthia Reach, MD, Last Rate: 75 mL/hr at 03/24/16 0228 .  acetaminophen (TYLENOL) tablet 650 mg, 650 mg, Oral, Q4H PRN, 650 mg at 03/23/16 1751 **OR** acetaminophen (  TYLENOL) suppository 650 mg, 650 mg, Rectal, Q4H PRN, Cynthia Reach, MD .  ALPRAZolam Duanne Moron) tablet 0.25 mg, 0.25 mg, Oral, BID PRN, Cynthia Reach, MD, 0.25 mg at 03/24/16 0501 .  famotidine (PEPCID) tablet 20 mg, 20 mg, Oral, BID, Cynthia [name], RPH, 20 mg at 03/24/16 1003 .  labetalol (NORMODYNE,TRANDATE) injection 20 mg, 20 mg, Intravenous, Once **AND** nicardipine (CARDENE) 20mg  in 0.86% saline 239ml IV infusion (0.1 mg/ml), 0-15 mg/hr, Intravenous, Continuous, Cynthia Reach, MD .  morphine 2 MG/ML injection 1-4 mg, 1-4 mg, Intravenous, Q1H PRN, Cynthia Reach, MD, 2 mg at 03/22/16 0755 .  ondansetron (ZOFRAN) injection 4 mg, 4 mg, Intravenous, Q4H PRN, Cynthia Doom, MD, 4 mg at 03/22/16 1203 .  senna-docusate (Senokot-S) tablet 1 tablet,  1 tablet, Oral, BID, Cynthia Reach, MD, 1 tablet at 03/24/16 1003  Patients Current Diet: Diet regular Room service appropriate?: Yes; Fluid consistency:: Thin  Precautions / Restrictions Precautions Precautions: Fall Precaution Comments: left inattention Restrictions Weight Bearing Restrictions: No   Has the patient had 2 or more falls or a fall with injury in the past year?Yes 1 fall a few weeks prior to admission with bruises to right side of buttocks, still present     Prior Activity Level Community (5-7x/wk): Prior to admission patient was out in the community daily.  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    Home Assistive Devices / Equipment Home Assistive Devices/Equipment: None Home Equipment: None  Prior Device Use: Indicate devices/aids used by the patient prior to current illness, exacerbation or injury? None of the above  Prior Functional Level Prior Function Level of Independence: Independent Comments[place] event ocurred. Works as Marketing executive.  [place]: Did the patient need help bathing, dressing, using the toilet or eating?  Independent  Indoor Mobility: Did the patient need assistance with walking from room to room (with or without device)? Independent  Stairs: Did the patient need assistance with internal or external stairs (with or without device)? Independent  Functional Cognition: Did the patient need help planning regular tasks such as shopping or remembering to take medications? Independent  Current Functional Level Cognition  Arousal/Alertness: Awake/alert Overall Cognitive Status: Impaired/Different from baseline Current Attention Level: Sustained Orientation Level: Oriented X4 Following Commands: Follows one step commands with increased time Safety/Judgement: Decreased awareness of deficits General Comments: pt aware of L side  deficits Attention: Sustained Sustained Attention: Appears intact Memory: Appears intact Awareness: Impaired Awareness Impairment: Emergent impairment, Anticipatory impairment, Intellectual impairment Problem Solving: Appears intact (verbal) Safety/Judgment: Impaired    Extremity Assessment (includes Sensation/Coordination)  Upper Extremity Assessment: LUE deficits/detail LUE Deficits / Details: AROM overall WFL. Strength: Shoulder flex/ext 4/5, elbow flex/ext 3+5, decreased grip strength. Maintaining flexed posture intermittently. LUE Coordination: decreased fine motor, decreased gross motor  Lower Extremity Assessment: Defer to PT evaluation LLE Deficits / Details: noted LLE gross weakness 3/5 strength also impacted by attention.  LLE Sensation: decreased proprioception LLE Coordination: decreased fine motor, decreased gross motor    ADLs  Overall ADL's : Needs assistance/impaired Eating/Feeding: Set up, Sitting Grooming: Minimal assistance, Sitting, Oral care, Cueing for sequencing Grooming Details (indicate cue type and reason): VCs to visually scan to L side to find toothbrush/toothpaste. Pt able to perform brushing teeth but required max verbal cues for initiation and termination of functional activities. Upper Body Bathing: Minimal assitance, Sitting Lower Body  Bathing: Moderate assistance, Sit to/from stand Upper Body Dressing : Minimal assistance, Sitting Lower Body Dressing: Moderate assistance, Sit to/from stand Lower Body Dressing Details (indicate cue type and reason): Pt attempted to don sock with VCs to use L hand to assist. Pt unable to get sock started on foot. Toilet Transfer: Moderate assistance, +2 for physical assistance, BSC (2 person hand held assist) Toilet Transfer Details (indicate cue type and reason): Simulated by sit to stand from EOB and short distance functional mobility in room. Toileting- Clothing Manipulation and Hygiene: Moderate assistance, Sit  to/from stand Functional mobility during ADLs: Moderate assistance, +2 for physical assistance General ADL Comments: Pt with L inattention requiring max multimodal cues to initiation of L side.    Mobility  Overal bed mobility: Needs Assistance Bed Mobility: Supine to Sit Supine to sit: Mod assist General bed mobility comments: not assessed; pt OOB in chair upon arrival    Transfers  Overall transfer level: Needs assistance Equipment used: Rolling walker (2 wheeled) Transfers: Sit to/from Stand Sit to Stand: Min assist Stand pivot transfers: Mod assist, +2 physical assistance General transfer comment: assist to initiate and follow through with tasks on L side and powering up into stand from recliner    Ambulation / Gait / Stairs / Wheelchair Mobility  Ambulation/Gait Ambulation/Gait assistance: Mod assist Ambulation Distance (Feet): 100 Feet Assistive device: 2 person hand held assist Gait Pattern/deviations: Step-through pattern, Decreased step length - left, Decreased stride length, Drifts right/left, Narrow base of support (drifts to R side) General Gait Details: pt with improved gait mechanics this session and no physical assist needed for advancement of L LE and weight shifting; assist for management of RW as pt drifts to R side and max cues for L step length, forward gaze, and worked on turning to L side and focusing on L side of hallway Gait velocity: decreased Gait velocity interpretation: Below normal speed for age/gender    Posture / Balance Dynamic Sitting Balance Sitting balance - Comments: min guard for EOB dynamic sitting for functional task performance Balance Overall balance assessment: Needs assistance Sitting-balance support: Feet supported, Bilateral upper extremity supported Sitting balance-Leahy Scale: Fair Sitting balance - Comments: min guard for EOB dynamic sitting for functional task performance Postural control: Right lateral lean (improved but continues  to demonstrate degree of lean) Standing balance support: Bilateral upper extremity supported Standing balance-Leahy Scale: Poor Standing balance comment: bil hand held assist for support    Special needs/care consideration BiPAP/CPAP: No CPM: No Continuous Drip IV: No Dialysis: No         Life Vest: No Oxygen: No Special Bed: No Trach Size: No Wound Vac (area): No       Skin: Bruising right buttocks                               Bowel mgmt: 03/20/16 Bladder mgmt: urgency that is resulting in intermittent incontinence  Diabetic mgmt: No     Previous Home Environment Living Arrangements: Spouse/significant other  Lives With: Spouse Available Help at Discharge: Family Type of Home: House Home Layout: Able to live on main level with bedroom/bathroom Home Access: Stairs to enter Entrance Stairs-Rails: None Entrance Stairs-Number of Steps: 4 Bathroom Shower/Tub: Chiropodist: Standard Home Care Services: No  Discharge Living Setting Plans for Discharge Living Setting: Patient's home Type of Home at Discharge: House Discharge Home Layout: Two level, Able to live on main level with  bedroom/bathroom Alternate Level Stairs-Rails: Left Alternate Level Stairs-Number of Steps: 10 Discharge Home Access: Stairs to enter Entrance Stairs-Rails: None Entrance Stairs-Number of Steps: 4 Discharge Bathroom Shower/Tub: Tub/shower unit, Curtain Discharge Bathroom Toilet: Standard Discharge Bathroom Accessibility: No (old farm home with a very small bathroom ) Does the patient have any problems obtaining your medications?: No  Social/Family/Support Systems Patient Roles: Spouse, Parent Anticipated Caregiver: husband Shanon Brow cell [phone] Anticipated Caregiver's Contact Information: Home [phone] Ability/Limitations of Caregiver: None Caregiver Availability: 24/7 Discharge Plan Discussed with Primary Caregiver: Yes Is Caregiver In Agreement with Plan?: Yes Does  Caregiver/Family have Issues with Lodging/Transportation while Pt is in Rehab?: No  Goals/Additional Needs Patient/Family Goal for Rehab: PT/OT Supervision-Min assist SLP Supervision-Mod I  Expected length of stay: 18-22 days Cultural Considerations: None Dietary Needs: None Equipment Needs: TBD Special Service Needs: None Additional Information: None Pt/Family Agrees to Admission and willing to participate: Yes Program Orientation Provided & Reviewed with Pt/Caregiver Including Roles  & Responsibilities: Yes Additional Information Needs: None Information Needs to be Provided By: N/A  Decrease burden of Care through IP rehab admission: No  Possible need for SNF placement upon discharge: No   Patient Condition: This patient's medical and functional status has changed since the consult dated: 03/22/16 in which the Rehabilitation Physician determined and documented that the patient's condition is appropriate for intensive rehabilitative care in an inpatient rehabilitation facility. Medical changes are: per neurologist Dr. Leonie Man patient's fall was not related to her current hemorrhage, resulting in a change of diagnosis from TBI to Stroke.  After evaluating the patient today and speaking with the Rehabilitation physician and acute team, the patient remains appropriate for inpatient rehab. Will admit to inpatient rehab today.  Preadmission Screen Completed By:  Gunnar Fusi, 03/24/2016 1:35 PM ______________________________________________________________________   Discussed status with Dr. Letta Pate on 03/24/16 at 1345 and received telephone approval for admission today.  Admission Coordinator:  Gunnar Fusi, time 1345/Date 03/24/16

## 2016-03-24 NOTE — Progress Notes (Signed)
Gunnar Fusi Rehab Admission Coordinator Signed Physical Medicine and Rehabilitation PMR Pre-admission 03/24/2016 1:35 PM  Related encounter: ED to Hosp-Admission (Current) from 03/20/2016 in [place] Collapse All   PMR Admission Coordinator Pre-Admission Assessment  Patient: Cynthia [name] is an 70 y.o., female MRN: [id number] DOB: 03-Nov-1945 Height: 5\' 6"  (167.6 cm) Weight: 69.6 kg (153 lb 7 oz)  Insurance Information HMO: PPO: X PCP: IPA: 80/20: OTHER:  PRIMARY: [employer] Medicare Policy#: [id number] Subscriber: Self  CM Name: Sharlett Iles Phone#: [phone] Fax#: [phone] Pre-Cert#: [id number] Employer: retired  Benefits: Phone #: [phone] Name: Terri Skains. Date: 01/11/16 Deduct: $0 Out of Pocket Max: $4,000 Life Max: None CIR: $160 days 1-10, $0 days 11-999 SNF: $0 days 1-20, $5 days 21-100 Outpatient: PT/OT/SLP Co-Pay: $20 Home Health: PT/PT/SLP Co-Pay: $0 DME: 80% Co-Pay: 20%  Providers: in network   Medicaid Application Date: Case Manager:  Disability Application Date: Case Worker:   Emergency Facilities manager Information    Name Relation Home Work [place]  [id number]     Ell[place], Rearden [phone]       Current Medical History  Patient Admitting Diagnosis: Right frontal ICH  History of Present Illness: Cynthia [name] is a 70 y.o. right handed female admitted 03/20/2016 with headache, left-sided weakness, and altered mental status and aphasia. There was a report of a fall from a first step of a ladder several days ago. Patient lives with husband in [place]. Independent prior to admission. 2 level home  with bedroom on first floor. Husband can assist as needed. CT of the head showed acute anterior right frontal lobe intra-axial hemorrhage estimated hemorrhage volume 20 mL. Surrounding edema with mild regional mass effect. MRI of the brain showed late acute early subacute intraparenchymal hemorrhage in the right posterior frontal lobe with layering and surrounding edema. CTA of head and neck shows right frontal lobe bilobed hematoma spanning 3.2 x 4.3 x 3.7 cm with surrounding vasogenic edema and mass effect upon the right ventricle appearing similar to recent MRI CT.Angiogram of head and neck showed no significant stenosis of either vertebral artery or AVM. Maintain on a regular diet. Physical therapy evaluation completed 03/21/2016 with recommendations of physical medicine rehabilitation consult   NIH Total: 3    Past Medical History  Past Medical History  Diagnosis Date  . Colon polyps   . Osteoporosis   . Depression     history of    Family History  family history includes Cancer in her mother; Heart disease in her mother.  Prior Rehab/Hospitalizations:  Has the patient had major surgery during 100 days prior to admission? No  Current Medications   Current facility-administered medications:  . stroke: mapping our early stages of recovery book, , Does not apply, Once, Darrel Reach, MD . 0.9 % sodium chloride infusion, , Intravenous, Continuous, Darrel Reach, MD, Last Rate: 75 mL/hr at 03/24/16 0228 . acetaminophen (TYLENOL) tablet 650 mg, 650 mg, Oral, Q4H PRN, 650 mg at 03/23/16 1751 **OR** acetaminophen (TYLENOL) suppository 650 mg, 650 mg, Rectal, Q4H PRN, Darrel Reach, MD . ALPRAZolam Duanne Moron) tablet 0.25 mg, 0.25 mg, Oral, BID PRN, Darrel Reach, MD, 0.25 mg at 03/24/16 0501 . famotidine (PEPCID) tablet 20 mg, 20 mg, Oral, BID, Valeda Malm Rumbarger, RPH, 20 mg at 03/24/16 1003 . labetalol (NORMODYNE,TRANDATE) injection 20 mg, 20  mg, Intravenous, Once **AND** nicardipine (CARDENE) 20mg  in 0.86% saline 246ml IV infusion (  0.1 mg/ml), 0-15 mg/hr, Intravenous, Continuous, Darrel Reach, MD . morphine 2 MG/ML injection 1-4 mg, 1-4 mg, Intravenous, Q1H PRN, Darrel Reach, MD, 2 mg at 03/22/16 0755 . ondansetron (ZOFRAN) injection 4 mg, 4 mg, Intravenous, Q4H PRN, Greta Doom, MD, 4 mg at 03/22/16 1203 . senna-docusate (Senokot-S) tablet 1 tablet, 1 tablet, Oral, BID, Darrel Reach, MD, 1 tablet at 03/24/16 1003  Patients Current Diet: Diet regular Room service appropriate?: Yes; Fluid consistency:: Thin  Precautions / Restrictions Precautions Precautions: Fall Precaution Comments: left inattention Restrictions Weight Bearing Restrictions: No   Has the patient had 2 or more falls or a fall with injury in the past year?Yes 1 fall a few weeks prior to admission with bruises to right side of buttocks, still present   Prior Activity Level Community (5-7x/wk): Prior to admission patient was out in the community daily. 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   Home Assistive Devices / Equipment Home Assistive Devices/Equipment: None Home Equipment: None  Prior Device Use: Indicate devices/aids used by the patient prior to current illness, exacerbation or injury? None of the above  Prior Functional Level Prior Function Level of Independence: Independent [place] ocurred. Works as Marketing executive.  [place]: Did the patient need help bathing, dressing, using the toilet or eating? Independent  Indoor Mobility: Did the patient need assistance with walking from room to room (with or without device)? Independent  Stairs: Did the patient need assistance with internal or external stairs (with or without device)? Independent  Functional Cognition: Did the patient need  help planning regular tasks such as shopping or remembering to take medications? Independent  Current Functional Level Cognition  Arousal/Alertness: Awake/alert Overall Cognitive Status: Impaired/Different from baseline Current Attention Level: Sustained Orientation Level: Oriented X4 Following Commands: Follows one step commands with increased time Safety/Judgement: Decreased awareness of deficits General Comments: pt aware of L side deficits Attention: Sustained Sustained Attention: Appears intact Memory: Appears intact Awareness: Impaired Awareness Impairment: Emergent impairment, Anticipatory impairment, Intellectual impairment Problem Solving: Appears intact (verbal) Safety/Judgment: Impaired   Extremity Assessment (includes Sensation/Coordination)  Upper Extremity Assessment: LUE deficits/detail LUE Deficits / Details: AROM overall WFL. Strength: Shoulder flex/ext 4/5, elbow flex/ext 3+5, decreased grip strength. Maintaining flexed posture intermittently. LUE Coordination: decreased fine motor, decreased gross motor  Lower Extremity Assessment: Defer to PT evaluation LLE Deficits / Details: noted LLE gross weakness 3/5 strength also impacted by attention.  LLE Sensation: decreased proprioception LLE Coordination: decreased fine motor, decreased gross motor    ADLs  Overall ADL's : Needs assistance/impaired Eating/Feeding: Set up, Sitting Grooming: Minimal assistance, Sitting, Oral care, Cueing for sequencing Grooming Details (indicate cue type and reason): VCs to visually scan to L side to find toothbrush/toothpaste. Pt able to perform brushing teeth but required max verbal cues for initiation and termination of functional activities. Upper Body Bathing: Minimal assitance, Sitting Lower Body Bathing: Moderate assistance, Sit to/from stand Upper Body Dressing : Minimal assistance, Sitting Lower Body Dressing: Moderate assistance, Sit to/from stand Lower Body  Dressing Details (indicate cue type and reason): Pt attempted to don sock with VCs to use L hand to assist. Pt unable to get sock started on foot. Toilet Transfer: Moderate assistance, +2 for physical assistance, BSC (2 person hand held assist) Toilet Transfer Details (indicate cue type and reason): Simulated by sit to stand from EOB and short distance functional  mobility in room. Toileting- Clothing Manipulation and Hygiene: Moderate assistance, Sit to/from stand Functional mobility during ADLs: Moderate assistance, +2 for physical assistance General ADL Comments: Pt with L inattention requiring max multimodal cues to initiation of L side.    Mobility  Overal bed mobility: Needs Assistance Bed Mobility: Supine to Sit Supine to sit: Mod assist General bed mobility comments: not assessed; pt OOB in chair upon arrival    Transfers  Overall transfer level: Needs assistance Equipment used: Rolling walker (2 wheeled) Transfers: Sit to/from Stand Sit to Stand: Min assist Stand pivot transfers: Mod assist, +2 physical assistance General transfer comment: assist to initiate and follow through with tasks on L side and powering up into stand from recliner    Ambulation / Gait / Stairs / Wheelchair Mobility  Ambulation/Gait Ambulation/Gait assistance: Mod assist Ambulation Distance (Feet): 100 Feet Assistive device: 2 person hand held assist Gait Pattern/deviations: Step-through pattern, Decreased step length - left, Decreased stride length, Drifts right/left, Narrow base of support (drifts to R side) General Gait Details: pt with improved gait mechanics this session and no physical assist needed for advancement of L LE and weight shifting; assist for management of RW as pt drifts to R side and max cues for L step length, forward gaze, and worked on turning to L side and focusing on L side of hallway Gait velocity: decreased Gait velocity interpretation: Below normal speed for age/gender     Posture / Balance Dynamic Sitting Balance Sitting balance - Comments: min guard for EOB dynamic sitting for functional task performance Balance Overall balance assessment: Needs assistance Sitting-balance support: Feet supported, Bilateral upper extremity supported Sitting balance-Leahy Scale: Fair Sitting balance - Comments: min guard for EOB dynamic sitting for functional task performance Postural control: Right lateral lean (improved but continues to demonstrate degree of lean) Standing balance support: Bilateral upper extremity supported Standing balance-Leahy Scale: Poor Standing balance comment: bil hand held assist for support    Special needs/care consideration BiPAP/CPAP: No CPM: No Continuous Drip IV: No Dialysis: No  Life Vest: No Oxygen: No Special Bed: No Trach Size: No Wound Vac (area): No  Skin: Bruising right buttocks  Bowel mgmt: 03/20/16 Bladder mgmt: urgency that is resulting in intermittent incontinence  Diabetic mgmt: No     Previous Home Environment Living Arrangements: Spouse/significant other Lives With: Spouse Available Help at Discharge: Family Type of Home: House Home Layout: Able to live on main level with bedroom/bathroom Home Access: Stairs to enter Entrance Stairs-Rails: None Entrance Stairs-Number of Steps: 4 Bathroom Shower/Tub: Chiropodist: Standard Home Care Services: No  Discharge Living Setting Plans for Discharge Living Setting: Patient's home Type of Home at Discharge: House Discharge Home Layout: Two level, Able to live on main level with bedroom/bathroom Alternate Level Stairs-Rails: Left Alternate Level Stairs-Number of Steps: 10 Discharge Home Access: Stairs to enter Entrance Stairs-Rails: None Entrance Stairs-Number of Steps: 4 Discharge Bathroom Shower/Tub: Tub/shower unit, Curtain Discharge Bathroom Toilet: Standard Discharge Bathroom  Accessibility: No (old farm home with a very small bathroom ) Does the patient have any problems obtaining your medications?: No  Social/Family/Support Systems Patient Roles: Spouse, Parent Anticipated Caregiver: husband Shanon Brow cell [phone] Anticipated Caregiver's Contact Information: Home [phone] Ability/Limitations of Caregiver: None Caregiver Availability: 24/7 Discharge Plan Discussed with Primary Caregiver: Yes Is Caregiver In Agreement with Plan?: Yes Does Caregiver/Family have Issues with Lodging/Transportation while Pt is in Rehab?: No  Goals/Additional Needs Patient/Family Goal for Rehab: PT/OT Supervision-Min assist SLP Supervision-Mod I  Expected length  of stay: 18-22 days Cultural Considerations: None Dietary Needs: None Equipment Needs: TBD Special Service Needs: None Additional Information: None Pt/Family Agrees to Admission and willing to participate: Yes Program Orientation Provided & Reviewed with Pt/Caregiver Including Roles & Responsibilities: Yes Additional Information Needs: None Information Needs to be Provided By: N/A  Decrease burden of Care through IP rehab admission: No  Possible need for SNF placement upon discharge: No   Patient Condition: This patient's medical and functional status has changed since the consult dated: 03/22/16 in which the Rehabilitation Physician determined and documented that the patient's condition is appropriate for intensive rehabilitative care in an inpatient rehabilitation facility. Medical changes are: per neurologist Dr. Leonie Man patient's fall was not related to her current hemorrhage, resulting in a change of diagnosis from TBI to Stroke. After evaluating the patient today and speaking with the Rehabilitation physician and acute team, the patient remains appropriate for inpatient rehab. Will admit to inpatient rehab today.  Preadmission Screen Completed By: Gunnar Fusi, 03/24/2016 1:35  PM ______________________________________________________________________  Discussed status with Dr. Letta Pate on 03/24/16 at 1345 and received telephone approval for admission today.  Admission Coordinator: Gunnar Fusi, time 1345/Date 03/24/16          Cosigned by: Charlett Blake, MD at 03/24/2016 3:15 PM  Revision History     Date/Time User Provider Type Action   03/24/2016 3:15 PM Charlett Blake, MD Physician Cosign   03/24/2016 2:04 PM Gunnar Fusi Rehab Admission Coordinator Sign   03/24/2016 1:59 PM Gunnar Fusi Rehab Admission Coordinator Sign   View Details Report

## 2016-03-24 NOTE — Care Management Note (Signed)
Case Management Note  Patient Details  Name: Cynthia [name] MRN: [id number] Date of Birth: 1945/12/07  Subjective/Objective:                    Action/Plan: Pt discharging to CIR today. No further needs per CM.   Expected Discharge Date:                  Expected Discharge Plan:  [place]  In-House Referral:     Discharge planning Services  CM Consult  Post Acute Care Choice:    Choice offered to:     DME Arranged:    DME Agency:     HH Arranged:    [place] Agency:     Status of Service:  Completed, signed off  If discussed at [employer] of Stay Meetings, dates discussed:    Additional Comments:  Pollie Friar, RN 03/24/2016, 2:11 PM

## 2016-03-24 NOTE — Progress Notes (Signed)
Patient transferred to 4 WEST 21.

## 2016-03-24 NOTE — Progress Notes (Signed)
Received pt. As a transfer from Cynthia BlackwellPt. And her family were oriented to rehab routine and protocol.Safety plan was explained.Fall prevention plan was explained and sign by pt's husband and Therapist, sports.

## 2016-03-24 NOTE — Care Management Important Message (Signed)
Important Message  Patient Details  Name: Cynthia [name] MRN: [id number] Date of Birth: 09-Oct-1945   Medicare Important Message Given:  Yes    Loann Quill 03/24/2016, 8:21 AM

## 2016-03-24 NOTE — Progress Notes (Signed)
Physical Therapy Treatment Patient Details Name: Cynthia [name] MRN: [id number] DOB: Oct 16, 1945 Today's Date: 03/24/2016    History of Present Illness 70 y.o. female with history of depression, colon polyps and osteoporosis presenting with expressive aphaisa and confusion and left side mild hemiplegia, CT shows R frontal ICH.    PT Comments    Patient is progressing toward mobility goals with improved ability to weight shift and advance L LE for ambulation. Pt continues to demo L side inattention however pt is more aware of deficits this session. Reviewed activities for inattention and pt positioned to encourage looking toward L side to visit family. Current plan remains appropriate.   Follow Up Recommendations  CIR;Supervision/Assistance - 24 hour     Equipment Recommendations  Other (comment) (TBD)    Recommendations for Other Services       Precautions / Restrictions Precautions Precautions: Fall Precaution Comments: left inattention Restrictions Weight Bearing Restrictions: No    Mobility  Bed Mobility               General bed mobility comments: not assessed; pt OOB in chair upon arrival  Transfers Overall transfer level: Needs assistance Equipment used: Rolling walker (2 wheeled) Transfers: Sit to/from Stand Sit to Stand: Min assist         General transfer comment: assist to initiate and follow through with tasks on L side and powering up into stand from recliner  Ambulation/Gait Ambulation/Gait assistance: Mod assist Ambulation Distance (Feet): 100 Feet Assistive device: 2 person hand held assist Gait Pattern/deviations: Step-through pattern;Decreased step length - left;Decreased stride length;Drifts right/left;Narrow base of support (drifts to R side) Gait velocity: decreased   General Gait Details: pt with improved gait mechanics this session and no physical assist needed for advancement of L LE and weight shifting; assist for management of RW as  pt drifts to R side and max cues for L step length, forward gaze, and worked on turning to L side and focusing on L side of hallway   Stairs            Wheelchair Mobility    Modified Rankin (Stroke Patients Only) Modified Rankin (Stroke Patients Only) Pre-Morbid Rankin Score: No symptoms Modified Rankin: Moderately severe disability     Balance Overall balance assessment: Needs assistance Sitting-balance support: Feet supported;Bilateral upper extremity supported Sitting balance-Leahy Scale: Fair     Standing balance support: Bilateral upper extremity supported Standing balance-Leahy Scale: Poor                      Cognition Arousal/Alertness: Awake/alert Behavior During Therapy: Flat affect Overall Cognitive Status: Impaired/Different from baseline Area of Impairment: Attention;Following commands;Safety/judgement;Problem solving   Current Attention Level: Sustained   Following Commands: Follows one step commands with increased time   Awareness: Emergent Problem Solving: Slow processing;Decreased initiation;Difficulty sequencing;Requires verbal cues;Requires tactile cues General Comments: pt aware of L side deficits    Exercises      General Comments General comments (skin integrity, edema, etc.): positioned pt in recliner so she could look to L side when visiting with family in room; reviewed activities for inattention       Pertinent Vitals/Pain Pain Assessment: No/denies pain    Home Living                      Prior Function            PT Goals (current goals can now be found in the care plan section) Acute  Rehab PT Goals Patient Stated Goal: to go home PT Goal Formulation: With patient/family Time For Goal Achievement: 04/04/16 Potential to Achieve Goals: Good Progress towards PT goals: Progressing toward goals    Frequency  Min 4X/week    PT Plan Current plan remains appropriate    Co-evaluation             End  of Session Equipment Utilized During Treatment: Gait belt Activity Tolerance: Patient tolerated treatment well Patient left: in chair;with call bell/phone within reach;with chair alarm set;with family/visitor present     Time: [id number] PT Time Calculation (min) (ACUTE ONLY): 34 min  Charges:  $Gait Training: 8-22 mins $Therapeutic Activity: 8-22 mins                    G Codes:      Salina April, PTA Pager: [phone]   03/24/2016, 11:58 AM

## 2016-03-24 NOTE — Discharge Summary (Addendum)
Physician Discharge Summary  Patient ID: Cynthia [name] MRN: [id number] DOB/AGE: [birth year] 68 y.o.  Admit date: 03/20/2016 Discharge date: 03/24/2016  Admission Diagnoses: Headache and word finding difficulty  Discharge Diagnoses: Right frontal lobar intracerebral hemorrhage of indeterminate etiology. Mild left hemiparesis and speech difficulties Active Problems:   Intracerebral hemorrhage   ICH (intracerebral hemorrhage) (HCC)   Vascular headache   Benign essential HTN   Hyponatremia   Acute blood loss anemia   TBI (traumatic brain injury) ([place]) Mild cytotoxic edema  Discharged Condition: good  Hospital Course: 70 year old right-handed woman who presented to the [facility] Emergency department after being sent from her doctor's office due to concern for possible stroke.The patient  reported that the night of 03/19/2016 (LKW, time unknown) she developed a sense of feeling disoriented and having difficulty speaking. When asked about her speech, she described that she had trouble focusing her thoughts and getting her words out. She reported that her sister also noticed that she seemed slow to respond to questions. She felt as if her speech was somewhat slurred as well. She did not endorse any weakness, numbness, vision changes, difficulty swallowing, or balance impairments. However, she had noticed that she will find her left arm in unusual postures and positions without being aware that it is doing anything. When she looked at it, she was able to control it without a problem and again denied any weakness or sensory changes. She  also had a headache for the past 2 days.  She denies any similar previous episodes. She denied any recent trauma to the head. She did note that she was working with her brother at her home and had been up on a ladder. She was getting off the ladder, she thought she was on the ground but instead was on the second step of the latter when she stumbled backwards. She says  that she struck her bottom on a nearby chair, then fell to the floor landing on her bottom. She did not strike her head. She has not had any recent illness. She does not use daily antiplatelet therapy or anticoagulation. She uses occasional Advil for headache and pain and has taken a total of six 200 mg tablets since last night. She also reports that she took a baby aspirin earlier today as recommended by her sister because of her symptoms.CT brain on admission showed a large right frontal ICH. Patient was not administered IV t-PA secondary to [place]. She was admitted to the neuro ICU for further evaluation and treatment. Her blood pressure was tightly controlled initially with IV drip and subsequently with oral medications. Follow-up CT scan showed stable appearance of the hemorrhage. There was mild cytotoxic edema but no hydrocephalus or any hemorrhage expansion. CT angiogram and CT venogram did not show any aneurysms or venous sinus thrombosis but there are no engorged vessels in the region of the hemorrhage raising concern about a small AVM hence cerebral catheter angiogram was performed and findings of which are also indeterminate and showed abnormal right frontopolar branches .She had persistent left-sided weakness but her speech and word finding difficulties improved. She was transferred to the neurology floor in a condition remained stable. She was seen by physical occupational therapist and felt to be a good candidate for inpatient rehabilitation. She was accepted for transfer to rehabilitation in stable condition.   Consults: rehabilitation medicine and ointerventional radiology  Significant Diagnostic Studies:   CT R frontal lobe ICH, 20 mL, possible trace SDH  MRI R posterior frontal lobe  ICH, 19-34 days old. No other blood products seen.  CTA head and neck No avm/aneurysm  CT venogram no sinus thrombosis  Cerebral angio No definite AVM or aneurysm noted but abnormal right frontopolar  branches raises the question of possible small AVM  LDL 84  HgbA1c  5.3 Discharge Exam: Blood pressure 152/70, pulse 88, temperature 97.9 F (36.6 C), temperature source Oral, resp. rate 16, height 5\' 6"  (1.676 m), weight 153 lb 7 oz (69.6 kg), SpO2 96 %.  PHYSICAL EXAM  pleasant middle-age Caucasian lady currently not in distress. . Afebrile. Head is nontraumatic. Neck is supple without bruit. Cardiac exam no murmur or gallop. Lungs are clear to auscultation. Distal pulses are well felt Neurological Exam :  Awake alert follows commands speech without aphasia. Oriented to time place and person. Follows one and two-step commands. No right left confusion. Vision acuity intact. Fundi were not visualized. Vision acuity and fields seem normal. Extraocular moments are full range without nystagmus. Mild left lower facial weakness. Tongue midline. Motor system exam mild left upper and lower extremity drift. Mild weakness of left grip and intrinsic hand muscles. Orbits right over left upper extremity. 4/5 left upper and lower extremity strength. Coordination slightly slow and impaired on the left compared to the right but not dysmetric likely due to weakness. Touch and pinprick sensation are preserved. Position sense possibly slightly impaired on the left but inconsistent. Deep tendon pulses are symmetric. Left plantar is equivocal right is downgoing. Gait was not tested.  Disposition:CLR      Discharge Instructions    Ambulatory referral to Neurology    Complete by:  As directed   Please schedule post stroke follow up in 2 months. Patient going to IP rehab prior to going home.           Home Discharge Medications   Medication List    TAKE these medications        ALPRAZolam 0.25 MG tablet  Commonly known as:  XANAX  TAKE 1 TABLET 3 TIMES DAILY AS NEEDED FOR ANXIETY     cetirizine 10 MG tablet  Commonly known as:  ZYRTEC  Take 10 mg by mouth daily as needed for allergies.      EPIPEN 2-PAK 0.3 mg/0.3 mL Soaj injection  Generic drug:  EPINEPHrine  INJECT 0.3 MLS (0.3 MG TOTAL) INTO THE MUSCLE ONCE.     ferrous sulfate 325 (65 FE) MG tablet  Take 325 mg by mouth daily as needed (to improve hemoglobin).     meloxicam 15 MG tablet  Commonly known as:  MOBIC  TAKE 1 TABLET BY MOUTH EVERY DAY     OS-CAL 500 + D PO  Take 1 tablet by mouth daily.        If d/c to Inpatient Rehab, Medications to to continued on Rehab .  stroke: mapping our early stages of recovery book   Does not apply Once  . famotidine  20 mg Oral BID  . labetalol  20 mg Intravenous Once  . senna-docusate  1 tablet Oral BID    Follow-up Information    Follow up with Wilburt Messina, MD In 2 months.   Specialties:  Neurology, Radiology   Why:  Stroke Clinic, Office will call you with appointment date & time   Contact information:   [place] [address] [phone]       Follow up with [facility], MD In 2 weeks.   Specialty:  Family Medicine  Why:  for hosital follow up after rehab stay   Contact information:   [address] [place] [address] [phone]     Total time spent on discharge summary 32 minutes  Signed: Torryn Hudspeth 03/24/2016, 4:54 PM

## 2016-03-24 NOTE — Progress Notes (Signed)
Ankit Lorie Phenix, MD Physician Signed Physical Medicine and Rehabilitation Consult Note 03/21/2016 1:28 PM  Related encounter: ED to Hosp-Admission (Current) from 03/20/2016 in [place] Collapse All        Physical Medicine and Rehabilitation Consult Reason for Consult: Right frontal [place] Referring Physician: Dr. Leonie Man   HPI: Cynthia [name] is a 70 y.o. right handed female admitted 03/20/2016 with headache, left-sided weakness, and altered mental status and aphasia. There was a report of a fall from a first step of a ladder several days ago. Patient lives with husband in [place]. Independent prior to admission. 2 level home with bedroom on first floor. Husband can assist as needed. CT of the head showed acute anterior right frontal lobe intra-axial hemorrhage estimated hemorrhage volume 20 mL. Surrounding edema with mild regional mass effect. MRI of the brain showed late acute early subacute intraparenchymal hemorrhage in the right posterior frontal lobe with layering and surrounding edema. CTA of head and neck shows right frontal lobe bilobed hematoma spanning 3.2 x 4.3 x 3.7 cm with surrounding vasogenic edema and mass effect upon the right ventricle appearing similar to recent MRI CT. CTA of neck no significant stenosis of either vertebral artery. Neurology service is consulted. CT angiogram of head and neck are pending. Workup currently ongoing. Maintain on a regular diet. Physical therapy evaluation completed 03/21/2016 with recommendations of physical medicine rehabilitation consult.   Review of Systems  Constitutional: Negative for fever and chills.  HENT: Negative for hearing loss.  Eyes: Negative for blurred vision and double vision.  Respiratory: Negative for cough and shortness of breath.  Cardiovascular: Negative for chest pain, palpitations and leg swelling.  Gastrointestinal: Positive for constipation.  Negative for nausea and vomiting.  Genitourinary: Negative for dysuria and hematuria.  Musculoskeletal: Positive for myalgias.  Neurological: Positive for speech change, weakness and headaches. Negative for seizures.  Psychiatric/Behavioral: Positive for depression.  All other systems reviewed and are negative.  Past Medical History  Diagnosis Date  . Colon polyps   . Osteoporosis   . Depression     history of   History reviewed. No pertinent past surgical history. Family History  Problem Relation Age of Onset  . Heart disease Mother   . Cancer Mother     lung   Social History:  reports that she has never smoked. She has never used smokeless tobacco. She reports that she does not drink alcohol or use illicit drugs. Allergies:  Allergies  Allergen Reactions  . Other Anaphylaxis    FIRE ANTS  . Penicillins Rash    Has patient had a PCN reaction causing immediate rash, facial/tongue/throat swelling, SOB or lightheadedness with hypotension: Unknown Has patient had a PCN reaction causing severe rash involving mucus membranes or skin necrosis: No Has patient had a PCN reaction that required hospitalization No Has patient had a PCN reaction occurring within the last 10 years: No If all of the above answers are "NO", then may proceed with Cephalosporin use.    Medications Prior to Admission  Medication Sig Dispense Refill  . ALPRAZolam (XANAX) 0.25 MG tablet TAKE 1 TABLET 3 TIMES DAILY AS NEEDED FOR ANXIETY (Patient taking differently: TAKE 1 TABLET 2 TIMES DAILY AS NEEDED FOR ANXIETY) 90 tablet 0  . Calcium Carbonate-Vitamin D (OS-CAL 500 + D PO) Take 1 tablet by mouth daily.     . meloxicam (MOBIC) 15 MG tablet TAKE 1 TABLET BY MOUTH EVERY DAY (  Patient taking differently: TAKE 1 TABLET (15 mg) BY MOUTH EVERY DAY as needed for lower back pain) 30 tablet 2  . cetirizine (ZYRTEC) 10 MG tablet Take 10 mg by mouth daily  as needed for allergies.     Marland Kitchen EPIPEN 2-PAK 0.3 MG/0.3ML SOAJ injection INJECT 0.3 MLS (0.3 MG TOTAL) INTO THE MUSCLE ONCE. 2 Device 0  . ferrous sulfate 325 (65 FE) MG tablet Take 325 mg by mouth daily as needed (to improve hemoglobin).       Home: Home Living Family/patient expects to be discharged to:: Private residence Living Arrangements: Spouse/significant other Available Help at Discharge: Family Type of Home: House Home Access: Stairs to enter Technical brewer of Steps: 4 Entrance Stairs-Rails: None Home Layout: Able to live on main level with bedroom/bathroom Bathroom Shower/Tub: Chiropodist: Standard Home Equipment: None  Functional History: Prior Function Level of Independence: Independent Comments: was out at the [place] this past weekend before event ocurred Functional Status:  Mobility: Bed Mobility Overal bed mobility: Needs Assistance Bed Mobility: Supine to Sit Supine to sit: Mod assist General bed mobility comments: max multi-modal cues to direct to task, patient required sequencing break down of task. cues to initiate LE movement to EOB, assist for LLE, cues for LUE to reach for rail, assist to elevate trunk to upright with HOB elevated and use of chuck pad and assist to rotate hips to EOB Transfers Overall transfer level: Needs assistance Equipment used: 2 person hand held assist Transfers: Sit to/from Stand, Stand Pivot Transfers Sit to Stand: Min assist, +2 physical assistance Stand pivot transfers: Mod assist, +2 physical assistance General transfer comment: Moderate assist to perform pivotal steps to chair, manual faciliatation of weight shift to the right to allow LLE to elevate and place. Cues for direction of specific task.  Ambulation/Gait General Gait Details: did not perform due to patient with nausea and vomitting during session.     ADL:    Cognition: Cognition Overall Cognitive Status:  Impaired/Different from baseline Orientation Level: Oriented X4 Cognition Arousal/Alertness: Awake/alert Behavior During Therapy: Flat affect Overall Cognitive Status: Impaired/Different from baseline Area of Impairment: Attention, Following commands, Safety/judgement, Awareness, Problem solving Current Attention Level: Focused Following Commands: Follows one step commands with increased time Safety/Judgement: Decreased awareness of deficits Awareness: Emergent Problem Solving: Slow processing, Decreased initiation, Difficulty sequencing, Requires verbal cues, Requires tactile cues General Comments: Patient with difficulty attending to left side but does repond to repeated cues, patient with limited focus during tasks requiring continuous cues to complete tasks.   Blood pressure 150/73, pulse 79, temperature 98.8 F (37.1 C), temperature source Oral, resp. rate 14, height 5\' 6"  (1.676 m), weight 69.6 kg (153 lb 7 oz), SpO2 98 %. Physical Exam  Vitals reviewed. Constitutional: She is oriented to person, place, and time. She appears well-developed and well-nourished.  HENT:  Head: Normocephalic and atraumatic.  Eyes:  Pupils reactive to light Right gaze preference  Neck: Normal range of motion. Neck supple. No thyromegaly present.  Cardiovascular: Normal rate and regular rhythm.  Respiratory: Effort normal and breath sounds normal. No respiratory distress.  GI: Soft. Bowel sounds are normal. She exhibits no distension.  Musculoskeletal: She exhibits no edema or tenderness.  Neurological: She is alert and oriented to person, place, and time.  Mood is flat.  Followed simple commands. Right gaze preferenceSensation intact to light touch DTRs symmetric Motor: RUE: 5/5 proximal to distal RLE: 4+/5 proximal to distal LUE: 0/5 LLE: 0/5 (however, brother states pt  was moving)  Skin: Skin is warm and dry.  Psychiatric:  Altered mentation     Lab Results Last 24 Hours    Results  for orders placed or performed during the hospital encounter of 03/20/16 (from the past 24 hour(s))  Urine rapid drug screen (hosp performed)not at [facility] Status: None   Collection Time: 03/20/16 1:31 PM  Result Value Ref Range   Opiates NONE DETECTED NONE DETECTED   Cocaine NONE DETECTED NONE DETECTED   Benzodiazepines NONE DETECTED NONE DETECTED   Amphetamines NONE DETECTED NONE DETECTED   Tetrahydrocannabinol NONE DETECTED NONE DETECTED   Barbiturates NONE DETECTED NONE DETECTED  Urinalysis, Routine w reflex microscopic (not at [facility]) Status: None   Collection Time: 03/20/16 1:31 PM  Result Value Ref Range   Color, Urine YELLOW YELLOW   APPearance CLEAR CLEAR   Specific Gravity, Urine 1.009 1.005 - 1.030   pH 7.5 5.0 - 8.0   Glucose, UA NEGATIVE NEGATIVE mg/dL   Hgb urine dipstick NEGATIVE NEGATIVE   Bilirubin Urine NEGATIVE NEGATIVE   Ketones, ur NEGATIVE NEGATIVE mg/dL   Protein, ur NEGATIVE NEGATIVE mg/dL   Nitrite NEGATIVE NEGATIVE   Leukocytes, UA NEGATIVE NEGATIVE  MRSA PCR Screening Status: None   Collection Time: 03/20/16 6:57 PM  Result Value Ref Range   MRSA by PCR NEGATIVE NEGATIVE  Glucose, capillary Status: Abnormal   Collection Time: 03/20/16 9:24 PM  Result Value Ref Range   Glucose-Capillary 148 (H) 65 - 99 mg/dL  Basic metabolic panel Status: Abnormal   Collection Time: 03/21/16 5:54 AM  Result Value Ref Range   Sodium 133 (L) 135 - 145 mmol/L   Potassium 4.1 3.5 - 5.1 mmol/L   Chloride 102 101 - 111 mmol/L   CO2 24 22 - 32 mmol/L   Glucose, Bld 130 (H) 65 - 99 mg/dL   BUN 10 6 - 20 mg/dL   Creatinine, Ser 0.61 0.44 - 1.00 mg/dL   Calcium 8.6 (L) 8.9 - 10.3 mg/dL   GFR calc non Af Amer >60 >60 mL/min   GFR calc Af Amer >60 >60 mL/min   Anion gap 7 5 - 15  CBC Status: Abnormal     Collection Time: 03/21/16 5:54 AM  Result Value Ref Range   WBC 6.9 4.0 - 10.5 K/uL   RBC 3.91 3.87 - 5.11 MIL/uL   Hemoglobin 11.8 (L) 12.0 - 15.0 g/dL   HCT 35.1 (L) 36.0 - 46.0 %   MCV 89.8 78.0 - 100.0 fL   MCH 30.2 26.0 - 34.0 pg   MCHC 33.6 30.0 - 36.0 g/dL   RDW 12.9 11.5 - 15.5 %   Platelets 269 150 - 400 K/uL  Lipid panel Status: None   Collection Time: 03/21/16 5:54 AM  Result Value Ref Range   Cholesterol 181 0 - 200 mg/dL   Triglycerides 37 <150 mg/dL   HDL 90 >40 mg/dL   Total CHOL/HDL Ratio 2.0 RATIO   VLDL 7 0 - 40 mg/dL   LDL Cholesterol 84 0 - 99 mg/dL  Glucose, capillary Status: Abnormal   Collection Time: 03/21/16 8:11 AM  Result Value Ref Range   Glucose-Capillary 133 (H) 65 - 99 mg/dL  Glucose, capillary Status: Abnormal   Collection Time: 03/21/16 12:13 PM  Result Value Ref Range   Glucose-Capillary 130 (H) 65 - 99 mg/dL   Comment 1 Notify RN    Comment 2 Document in Chart       Imaging Results (Last 48 hours)  Ct Head Wo Contrast  03/20/2016 ADDENDUM REPORT: 03/20/2016 14:15 ADDENDUM: Study discussed by telephone with Dr. Andee Poles RAY on 03/20/2016 at 14:14 . At 1407 hours. Electronically Signed By: Genevie Ann M.D. On: 03/20/2016 14:15  03/20/2016 CLINICAL DATA: 70 year old female with sudden onset left side weakness last night at 2000 hours. Headache. No known trauma. Initial encounter. EXAM: CT HEAD WITHOUT CONTRAST TECHNIQUE: Contiguous axial images were obtained from the base of the skull through the vertex without intravenous contrast. COMPARISON: None. FINDINGS: No osseous abnormality identified. Trace fluid in the left sphenoid sinus. Other Visualized paranasal sinuses and mastoids are well pneumatized. Negative visualized orbit and scalp soft tissues. Mild Calcified atherosclerosis at the skull base. Intra-axial parenchymal  hemorrhage in the anterior right frontal lobe is somewhat flame shaped and encompasses 30 x 34 x 39 mL (AP by transverse by CC) foreign estimated hemorrhage volume of 20 mL. The hemorrhage extends to the interhemispheric fissure, and it is difficult to exclude a trace subdural component along the falx at this time (series 21, image 19). No intraventricular extension. No other extra-axial hemorrhage identified. Surrounding hypodense edema with mild regional mass effect. 1-2 mm of focal leftward midline shift. Mild mass effect on the right frontal horn. Basilar cisterns remain patent. No ventriculomegaly. Patchy bilateral cerebral white matter hypodensity elsewhere. No other acute cortically based infarct. No suspicious intracranial vascular hyperdensity. IMPRESSION: 1. Acute anterior right frontal lobe intra-axial hemorrhage. Estimated hemorrhage volume 20 mL. Possible trace subdural extension at this time along the interhemispheric fissure. 2. Surrounding edema with mild regional mass effect. No intraventricular or other extra-axial extension. 3. Underlying moderate nonspecific cerebral white matter signal changes. 4. Top differential considerations include hypertensive/small vessel related hemorrhage, sequelae of acute coagulopathy, amyloid angiopathy, and less likely vascular malformation or tumor related. Electronically Signed: By: Genevie Ann M.D. On: 03/20/2016 14:02   Mr Brain Wo Contrast  03/20/2016 CLINICAL DATA: Followup intracranial hemorrhage. Recently fell from ladder but does not described striking the head. EXAM: MRI HEAD WITHOUT CONTRAST TECHNIQUE: Multiplanar, multiecho pulse sequences of the brain and surrounding structures were obtained without intravenous contrast. COMPARISON: Head CT same day FINDINGS: The brain shows a background pattern of moderate chronic small-vessel ischemic change throughout the cerebral hemispheric deep and subcortical white matter. Mild chronic small-vessel changes  present in the pons. Within the right posterior frontal region, there is late acute/ early subacute intraparenchymal hemorrhage with a bilobed hematoma measuring in total 3.2 cm right to left, 4.3 cm front to back and 3.7 cm cephalo caudal. I think this is unchanged in size, some of the apparent larger AP dimension at MRI due to the fact that MRI is able to see the isodense components. Surrounding vasogenic edema, also consistent with the late acute/ early subacute timeframe. There is mild mass-effect for there is no midline shift. No threatening herniation. No finding by MRI of that would suggest an underlying mass lesion or high-flow vascular abnormality. There are not other foci of blood products in the brain, arguing against chronic amyloid disease. No hydrocephalus. No extra-axial collection or blood component. Layering fluid in the right maxillary sinus consistent with right maxillary sinusitis. Small mastoid effusion on the right. No pituitary mass. Major vessels at the base of the brain show flow. IMPRESSION: Late acute/ early subacute intraparenchymal hemorrhage in the right posterior frontal lobe with layering and surrounding edema. I would estimate that this hemorrhage is between 3 and 42-days-old. I do not believe there has been any additional bleeding since the CT. Larger  measurements in the front to back dimension relate to the ability of the MRI to characterize isodense components of the hematoma. No sign of underlying tumor or high-flow vascular malformation. No other foci of blood products in the brain, arguing against amyloid angiopathy. Electronically Signed By: Nelson Chimes M.D. On: 03/20/2016 17:56     Assessment/Plan: Diagnosis: TBI with Right frontal ICH Labs and images independently reviewed. Records reviewed and summated above. [place] score: ?V-VI Speech to evaluate for Post traumatic amnesia and interval GOAT scores to assess  progress. NeuroPsych evaluation for behavorial assessment. Provide environmental management by reducing the level of stimulation, tolerating restlessness when possible, protecting patient from harming self or others and reducing patient's cognitive confusion. Address behavioral concerns include providing structured environments and daily routines. Cognitive therapy to direct modular abilities in order to maintain goals including problem solving, self regulation/monitoring, self management, attention, and memory. Fall precautions; pt at risk for second impact syndrome Prevention of secondary injury: monitor for hypotension, hypoxia, seizures or signs of increased ICP Prophylactic AED:  Consider pharmacological intervention if necessary with neurostimulants, such as amantadine, methylphenidate, modafinil, etc. Consider Propranolol for agitation and storming Avoid medications that could impair cognitive abilities, such as anticholinergics, antihistaminic, benzodiazapines, narcotics, etc when possible  1. Does the need for close, 24 hr/day medical supervision in concert with the patient's rehab needs make it unreasonable for this patient to be served in a less intensive setting? Yes  2. Co-Morbidities requiring supervision/potential complications: headache (ensure headache does not limit functional progress), HTN (monitor and provide prns in accordance with increased physical exertion and pain), hyponatremia (cont to monitor, treat if necessary), ABLA (transfuse if necessary to ensure appropriate perfusion for increased activity tolerance) 3. Due to bladder management, safety, skin/wound care, disease management, pain management and patient education, does the patient require 24 hr/day rehab nursing? Yes 4. Does the patient require coordinated care of a physician, rehab  nurse, PT (1-2 hrs/day, 5 days/week), OT (1-2 hrs/day, 5 days/week) and SLP (1-2 hrs/day, 5 days/week) to address physical and functional deficits in the context of the above medical diagnosis(es)? Yes Addressing deficits in the following areas: balance, endurance, locomotion, strength, transferring, bowel/bladder control, bathing, dressing, toileting, cognition, speech and psychosocial support 5. Can the patient actively participate in an intensive therapy program of at least 3 hrs of therapy per day at least 5 days per week? Potentially 6. The potential for patient to make measurable gains while on inpatient rehab is excellent 7. Anticipated functional outcomes upon discharge from inpatient rehab are supervision and min assist with PT, supervision and min assist with OT, modified independent and supervision with SLP. 8. Estimated rehab length of stay to reach the above functional goals is: 18-22 days. 9. Does the patient have adequate social supports and living environment to accommodate these discharge functional goals? Yes 10. Anticipated D/C setting: Home 11. Anticipated post D/C treatments: HH therapy and Home excercise program 12. Overall Rehab/Functional Prognosis: good  RECOMMENDATIONS: This patient's condition is appropriate for continued rehabilitative care in the following setting: CIR when medical workup complete and appropriate  Patient has agreed to participate in recommended program. Potentially Note that insurance prior authorization may be required for reimbursement for recommended care.  Comment: Rehab Admissions Coordinator to follow up.  Delice Lesch, MD 03/21/2016       Revision History     Date/Time User Provider Type Action   03/22/2016 4:56 PM Ankit Lorie Phenix, MD Physician Sign   03/22/2016 7:19 AM Lavon Paganini  [place], PA-C Physician Assistant [place]   View Details Report       Routing History     Date/Time From To Method   03/22/2016 4:56 PM Ankit Lorie Phenix, MD Susy Frizzle, MD In [facility]

## 2016-03-25 ENCOUNTER — Inpatient Hospital Stay (HOSPITAL_COMMUNITY): Payer: Medicare Other | Admitting: Physical Therapy

## 2016-03-25 ENCOUNTER — Inpatient Hospital Stay (HOSPITAL_COMMUNITY): Payer: Medicare Other | Admitting: Speech Pathology

## 2016-03-25 ENCOUNTER — Inpatient Hospital Stay (HOSPITAL_COMMUNITY): Payer: Medicare Other | Admitting: Occupational Therapy

## 2016-03-25 DIAGNOSIS — G8194 Hemiplegia, unspecified affecting left nondominant side: Secondary | ICD-10-CM

## 2016-03-25 DIAGNOSIS — I610 Nontraumatic intracerebral hemorrhage in hemisphere, subcortical: Secondary | ICD-10-CM

## 2016-03-25 LAB — CBC WITH DIFFERENTIAL/PLATELET
BASOS ABS: 0 10*3/uL (ref 0.0–0.1)
Basophils Relative: 0 %
Eosinophils Absolute: 0 10*3/uL (ref 0.0–0.7)
Eosinophils Relative: 0 %
HEMATOCRIT: 34.4 % — AB (ref 36.0–46.0)
Hemoglobin: 12.1 g/dL (ref 12.0–15.0)
LYMPHS PCT: 21 %
Lymphs Abs: 1.9 10*3/uL (ref 0.7–4.0)
MCH: 30.6 pg (ref 26.0–34.0)
MCHC: 35.2 g/dL (ref 30.0–36.0)
MCV: 86.9 fL (ref 78.0–100.0)
MONO ABS: 0.6 10*3/uL (ref 0.1–1.0)
Monocytes Relative: 6 %
NEUTROS ABS: 6.7 10*3/uL (ref 1.7–7.7)
Neutrophils Relative %: 73 %
Platelets: 279 10*3/uL (ref 150–400)
RBC: 3.96 MIL/uL (ref 3.87–5.11)
RDW: 12.8 % (ref 11.5–15.5)
WBC: 9.2 10*3/uL (ref 4.0–10.5)

## 2016-03-25 LAB — COMPREHENSIVE METABOLIC PANEL
ALK PHOS: 80 U/L (ref 38–126)
ALT: 30 U/L (ref 14–54)
AST: 45 U/L — AB (ref 15–41)
Albumin: 3.4 g/dL — ABNORMAL LOW (ref 3.5–5.0)
Anion gap: 11 (ref 5–15)
BILIRUBIN TOTAL: 0.7 mg/dL (ref 0.3–1.2)
CO2: 29 mmol/L (ref 22–32)
CREATININE: 0.61 mg/dL (ref 0.44–1.00)
Calcium: 9.1 mg/dL (ref 8.9–10.3)
Chloride: 99 mmol/L — ABNORMAL LOW (ref 101–111)
GFR calc Af Amer: >60 mL/min (ref >60)
Glucose, Bld: 101 mg/dL — ABNORMAL HIGH (ref 65–99)
Potassium: 2.9 mmol/L — ABNORMAL LOW (ref 3.5–5.1)
Sodium: 139 mmol/L (ref 135–145)
TOTAL PROTEIN: 6.2 g/dL — AB (ref 6.5–8.1)

## 2016-03-25 MED ORDER — POTASSIUM CHLORIDE CRYS ER 20 MEQ PO TBCR
20.0000 meq | EXTENDED_RELEASE_TABLET | Freq: Two times a day (BID) | ORAL | Status: AC
  Administered 2016-03-25 – 2016-03-27 (×6): 20 meq via ORAL
  Filled 2016-03-25 (×6): qty 1

## 2016-03-25 NOTE — Progress Notes (Signed)
70 y.o. right handed female admitted 03/20/2016 with headache, left-sided weakness, and altered mental status and aphasia. There was a report of a fall from a first step of a ladder several days ago. Patient lives with husband in [place]. Independent prior to admission. 2 level home with bedroom on first floor. Husband can assist as needed. CT of the head showed acute anterior right frontal lobe intra-axial hemorrhage estimated hemorrhage volume 20 mL. Surrounding edema with mild regional mass effect. MRI of the brain showed late acute early subacute intraparenchymal hemorrhage in the right posterior frontal lobe with layering and surrounding edema. CTA of head and neck shows right frontal lobe bilobed hematoma spanning 3.2 x 4.3 x 3.7 cm with surrounding vasogenic edema and mass effect upon the right ventricle appearing similar to recent MRI CT.Angiogram of head and neck showed no significant stenosis of either vertebral artery or AVM.   Subjective/Complaints: No new issues overnite Discussed rehab process  ROS  Denies CP, SOB, N/V/D  Objective: Vital Signs: Blood pressure 144/59, pulse 78, temperature 98.4 F (36.9 C), temperature source Oral, resp. rate 18, height 5' 6"  (1.676 m), weight 70.308 kg (155 lb), SpO2 100 %. No results found. Results for orders placed or performed during the hospital encounter of 03/24/16 (from the past 72 hour(s))  CBC WITH DIFFERENTIAL     Status: Abnormal   Collection Time: 03/25/16  5:28 AM  Result Value Ref Range   WBC 9.2 4.0 - 10.5 K/uL   RBC 3.96 3.87 - 5.11 MIL/uL   Hemoglobin 12.1 12.0 - 15.0 g/dL   HCT 34.4 (L) 36.0 - 46.0 %   MCV 86.9 78.0 - 100.0 fL   MCH 30.6 26.0 - 34.0 pg   MCHC 35.2 30.0 - 36.0 g/dL   RDW 12.8 11.5 - 15.5 %   Platelets 279 150 - 400 K/uL   Neutrophils Relative % 73 %   Neutro Abs 6.7 1.7 - 7.7 K/uL   Lymphocytes Relative 21 %   Lymphs Abs 1.9 0.7 - 4.0 K/uL   Monocytes Relative 6 %   Monocytes Absolute  0.6 0.1 - 1.0 K/uL   Eosinophils Relative 0 %   Eosinophils Absolute 0.0 0.0 - 0.7 K/uL   Basophils Relative 0 %   Basophils Absolute 0.0 0.0 - 0.1 K/uL  Comprehensive metabolic panel     Status: Abnormal   Collection Time: 03/25/16  5:28 AM  Result Value Ref Range   Sodium 139 135 - 145 mmol/L   Potassium 2.9 (L) 3.5 - 5.1 mmol/L   Chloride 99 (L) 101 - 111 mmol/L   CO2 29 22 - 32 mmol/L   Glucose, Bld 101 (H) 65 - 99 mg/dL   BUN <5 (L) 6 - 20 mg/dL   Creatinine, Ser 0.61 0.44 - 1.00 mg/dL   Calcium 9.1 8.9 - 10.3 mg/dL   Total Protein 6.2 (L) 6.5 - 8.1 g/dL   Albumin 3.4 (L) 3.5 - 5.0 g/dL   AST 45 (H) 15 - 41 U/L   ALT 30 14 - 54 U/L   Alkaline Phosphatase 80 38 - 126 U/L   Total Bilirubin 0.7 0.3 - 1.2 mg/dL   GFR calc non Af Amer >60 >60 mL/min   GFR calc Af Amer >60 >60 mL/min    Comment: (NOTE) The eGFR has been calculated using the CKD EPI equation. This calculation has not been validated in all clinical situations. eGFR's persistently <60 mL/min signify possible Chronic Kidney Disease.  Anion gap 11 5 - 15       Constitutional: She is oriented to person, place, and time. She appears well-developed and well-nourished.  HENT:  Head: Normocephalic and atraumatic.  Eyes:  Pupils reactive to light Right gaze preference  Neck: Normal range of motion. Neck supple. No thyromegaly present.  Cardiovascular: Normal rate and regular rhythm.  Respiratory: Effort normal and breath sounds normal. No respiratory distress.  GI: Soft. Bowel sounds are normal. She exhibits no distension.  Musculoskeletal: She exhibits no edema or tenderness.  Neurological: She is alert and oriented to person, place, and time.  Mood is flat.  Followed simple commands. Right gaze preferenceSensation intact to light touch DTRs symmetric Motor: RUE: 5/5 proximal to distal RLE: 4+/5 proximal to distal LUE: 0/5 LLE: 2 minus/5 hip and knee extensor synergy, trace toe flexion absent  Ankle dorsiflexionAssessment/Plan: 1. Functional deficits secondary to Right frontal ICH which require 3+ hours per day of interdisciplinary therapy in a comprehensive inpatient rehab setting. Physiatrist is providing close team supervision and 24 hour management of active medical problems listed below. Physiatrist and rehab team continue to assess barriers to discharge/monitor patient progress toward functional and medical goals. FIM:                                   Medical Problem List and Plan: 1.  Left hemiplegia secondary to right frontal ICH- CIR PT, OT, SLP 2.  DVT Prophylaxis/Anticoagulation: SCDs. Monitor for signs of DVT 3. Pain Management: Tylenol as needed 4. Mood: Xanax 0.25 mg twice daily as needed for 5. Neuropsych: This patient is capable of making decisions on her own behalf. 6. Skin/Wound Care: Routine skin checks 7. Fluids/Electrolytes/Nutrition: Routine I&O's with follow-up chemistries, Hypo K will supplement   LOS (Days) 1 A FACE TO FACE EVALUATION WAS PERFORMED  Dimitrious Micciche E 03/25/2016, 8:37 AM

## 2016-03-25 NOTE — Care Management Note (Signed)
[facility] Individual Statement of Services  Patient Name:  Cynthia [name]  Date:  03/25/2016  Welcome to the [facility].  Our goal is to provide you with an individualized program based on your diagnosis and situation, designed to meet your specific needs.  With this comprehensive rehabilitation program, you will be expected to participate in at least 3 hours of rehabilitation therapies Monday-Friday, with modified therapy programming on the weekends.  Your rehabilitation program will include the following services:  Physical Therapy (PT), Occupational Therapy (OT), Speech Therapy (ST), 24 hour per day rehabilitation nursing, Therapeutic Recreaction (TR), Neuropsychology, Case Management (Social Worker), Rehabilitation Medicine, Nutrition Services and Pharmacy Services  Weekly team conferences will be held on Wednesday to discuss your progress.  Your Social Worker will talk with you frequently to get your input and to update you on team discussions.  Team conferences with you and your family in attendance may also be held.  Expected length of stay: 10-14 days  Overall anticipated outcome: supervision with cueing  Depending on your progress and recovery, your program may change. Your Social Worker will coordinate services and will keep you informed of any changes. Your Social Worker's name and contact numbers are listed  below.  The following services may also be recommended but are not provided by the [facility] will be made to provide these services after discharge if needed.  Arrangements include referral to agencies that provide these services.  Your insurance has been verified to be:  [employer]-Medicare Your primary doctor is:  Jenna Luo  Pertinent information will be shared with  your doctor and your insurance company.  Social Worker:  Ovidio Kin, [place] or [phone]  Information discussed with and copy given to patient by: Elease Hashimoto, 03/25/2016, 10:18 AM

## 2016-03-25 NOTE — Progress Notes (Signed)
Social Work  Social Work Assessment and Plan  Patient Details  Name: Yasuri Pomerenke MRN: [id number] Date of Birth: [birth year]  Today's Date: 03/25/2016  Problem List:  Patient Active Problem List   Diagnosis Date Noted  . Vascular headache   . Benign essential HTN   . Hyponatremia   . Acute blood loss anemia   . TBI (traumatic brain injury) ([place])   . Intracerebral hemorrhage 03/20/2016  . ICH (intracerebral hemorrhage) ([place]) 03/20/2016  . Colon polyps   . Osteoporosis   . HTN (hypertension) 01/08/2013   Past Medical History:  Past Medical History  Diagnosis Date  . Colon polyps   . Osteoporosis   . Depression     history of   Past Surgical History: No past surgical history on file. Social History:  reports that she has never smoked. She has never used smokeless tobacco. She reports that she does not drink alcohol or use illicit drugs.  Family / Support Systems Marital Status: Married Patient Roles: Spouse, Parent Spouse/Significant Other: Shanon Brow  [phone] Children: Antony Salmon [phone] Other Supports: Another son and a step-daughter Anticipated Caregiver: Husband Ability/Limitations of Caregiver: Husband is a Chief Executive Officer but is very flexible has own hours Caregiver Availability: 24/7 Family Dynamics: Close knit with children who are supportive and involved have been here from [place] to make sure pt is ok. Pt is somewhat down due to affects of stroke and not wanting her life to be like this. They have good social supports.  Social History Preferred language: English Religion:  Cultural Background: No issues Education: College Read: Yes Write: Yes Employment Status: Retired Date Retired/Disabled/Unemployed: Education officer, museum but still Clinical biochemist Issues: No issues Guardian/Conservator: None-according to MD pt is capable of making her own decisions while here   Abuse/Neglect Physical Abuse: Denies Verbal  Abuse: Denies Sexual Abuse: Denies Exploitation of patient/patient's resources: Denies Self-Neglect: Denies  Emotional Status Pt's affect, behavior adn adjustment status: Pt is motivated and pleased wht her PT session this am. She walked and actually did stairs. She wants to regain her independence due to she prides herself on this. She never envisioned herself being like this and is having a hard time at times dealing with this. Will benefit from neuro-psych services while here. Recent Psychosocial Issues: other health issues-were managed Pyschiatric History: History of depression has taken medication in the past which were helpful. Will make neuro-psych referral and may benefit from antidepressant to assist with coping with her new stroke Substance Abuse History: No issues  Patient / Family Perceptions, Expectations & Goals Pt/Family understanding of illness & functional limitations: Pt and husband can explain her stroke and deficits, both want her to do well here. Both talk with the MD and feel their questions have been addressed and answered. Premorbid pt/family roles/activities: other health issues were managed Anticipated changes in roles/activities/participation: Wife, Mother, retiree, Psychologist, occupational, church member, friend, Social research officer, government Pt/family expectations/goals: resume  Recruitment consultant: None Premorbid Home Care/DME Agencies: None Transportation available at discharge: Husband Resource referrals recommended: Neuropsychology, Support group (specify)  Discharge Planning Living Arrangements: Spouse/significant other Support Systems: Spouse/significant other, Children, Other relatives, Water engineer, Social worker community Type of Residence: Private residence Insurance underwriter Resources: Multimedia programmer (specify) Primary school teacher) Financial Resources: Fish farm manager, Family Support Financial Screen Referred: No Living Expenses: Own Money Management: Spouse, Patient Does the  patient have any problems obtaining your medications?: No Home Management: Patient and husband share Patient/Family Preliminary Plans: Return home with husbnand who can flex his schedule to provide care if  necessary. Pt is hopeful she will not need care at discharge. She is hoping she will be mod/i level. Will await team eavluations and work on the discharge plan. Social Work Anticipated Follow Up Needs: HH/OP, Support Group  Clinical Impression Pleasant female who is having some difficulty coping with her stroke and the deficts from it. She is encouraged by her PT session this am where she walked and did the stairs. Discussed need for patience and her body needs time to recover From this event. She is making progressing quickly which is a good sign of recovery. Her husband is involved and willing to assist her, he is bringing her clothes now. Would benefit from neuro-psych seeing for support and possible antidepressant while here. Discussed it is healthy to cry and she is grieving her loss of independence but is making improvements. Will continue to follow and provide support.  Elease Hashimoto 03/25/2016, 10:32 AM

## 2016-03-25 NOTE — Evaluation (Signed)
Occupational Therapy Assessment and Plan  Patient Details  Name: Cynthia [name] MRN: [id number] Date of Birth: 05-Jan-1946  OT Diagnosis: cognitive deficits, disturbance of vision and hemiplegia affecting non-dominant side Rehab Potential: Rehab Potential (ACUTE ONLY): Excellent ELOS: 12-14 days   Today's Date: 03/25/2016 OT Individual Time: 5993-5701 OT Individual Time Calculation (min): 74 min     Problem List:  Patient Active Problem List   Diagnosis Date Noted  . Vascular headache   . Benign essential HTN   . Hyponatremia   . Acute blood loss anemia   . TBI (traumatic brain injury) ([place])   . Intracerebral hemorrhage 03/20/2016  . ICH (intracerebral hemorrhage) ([place]) 03/20/2016  . Colon polyps   . Osteoporosis   . HTN (hypertension) 01/08/2013    Past Medical History:  Past Medical History  Diagnosis Date  . Colon polyps   . Osteoporosis   . Depression     history of   Past Surgical History: No past surgical history on file.  Assessment & Plan Clinical Impression:  Cynthia [name] is a 70 y.o. right handed female admitted 03/20/2016 with headache, left-sided weakness, and altered mental status and aphasia. There was a report of a fall from a first step of a ladder several days ago. Patient lives with husband in [place]. Independent prior to admission. 2 level home with bedroom on first floor. Husband can assist as needed. CT of the head showed acute anterior right frontal lobe intra-axial hemorrhage estimated hemorrhage volume 20 mL. Surrounding edema with mild regional mass effect. MRI of the brain showed late acute early subacute intraparenchymal hemorrhage in the right posterior frontal lobe with layering and surrounding edema. CTA of head and neck shows right frontal lobe bilobed hematoma spanning 3.2 x 4.3 x 3.7 cm with surrounding vasogenic edema and mass effect upon the right ventricle appearing similar to recent MRI CT.Angiogram of head and  neck  showed no significant stenosis of either vertebral artery or AVM. Maintain on a regular diet. Physical therapy evaluation completed 03/21/2016 with recommendations of physical medicine rehabilitation consult  Patient transferred to CIR on 03/24/2016 .    Patient currently requires mod with basic self-care skills secondary to abnormal tone, decreased visual perceptual skills, decreased visual motor skills and hemianopsia, decreased attention to left, decreased problem solving and decreased safety awareness and decreased sitting balance, decreased standing balance, decreased postural control and hemiplegia.  Prior to hospitalization, patient was very active and independent.  Patient will benefit from skilled intervention to increase independence with basic self-care skills prior to discharge home with care partner.  Anticipate patient will require 24 hour supervision and follow up home health.  OT - End of Session Activity Tolerance: Tolerates 30+ min activity with multiple rests Endurance Deficit: Yes OT Assessment Rehab Potential (ACUTE ONLY): Excellent OT Patient demonstrates impairments in the following area(s): Balance;Cognition;Endurance;Motor;Perception;Safety;Sensory;Vision OT Basic ADL's Functional Problem(s): Eating;Grooming;Bathing;Dressing;Toileting OT Transfers Functional Problem(s): Toilet;Tub/Shower OT Additional Impairment(s): Fuctional Use of Upper Extremity OT Plan OT Intensity: Minimum of 1-2 x/day, 45 to 90 minutes OT Frequency: 5 out of 7 days OT Duration/Estimated Length of Stay: 12-14 days OT Treatment/Interventions: Balance/vestibular training;Cognitive remediation/compensation;Discharge planning;Community reintegration;DME/adaptive equipment instruction;Functional mobility training;Neuromuscular re-education;Psychosocial support;Patient/family education;Self Care/advanced ADL retraining;Therapeutic Activities;Therapeutic Exercise;UE/LE Strength taining/ROM;UE/LE Coordination  activities;Visual/perceptual remediation/compensation OT Self Feeding Anticipated Outcome(s): I OT Basic Self-Care Anticipated Outcome(s): supervision OT Toileting Anticipated Outcome(s): supervision OT Bathroom Transfers Anticipated Outcome(s): supervision OT Recommendation Patient destination: Home Follow Up Recommendations: Home health OT Equipment Recommended: Tub/shower seat   Skilled  Therapeutic Intervention Pt seen for initial evaluation and ADL retraining with pt/family education with spouse. Reviewed OT POC and goals. Pt showered in tub room and completed dressing from chair level with mod A. Max verbal cues to attend to L side, to turn head for eye contact, and scanning environment. Pt with L lean in sitting. Once back in room used wedge under L hip for pelvic alignment. Pt in recliner with all needs met.   OT Evaluation Precautions/Restrictions  Precautions Precautions: Fall Precaution Comments: left inattention     Pain Pain Assessment Pain Assessment: No/denies pain Home Living/Prior Functioning Home Living Living Arrangements: Spouse/significant other Available Help at Discharge: Family Type of Home: House Home Access: Stairs to enter Technical brewer of Steps: 4 Entrance Stairs-Rails: None Home Layout: Able to live on main level with bedroom/bathroom Bathroom Shower/Tub: Tub/shower unit, Architectural technologist: Standard  Lives With: Spouse Prior Function Level of Independence: Independent with gait, Independent with basic ADLs, Independent with homemaking with ambulation  Able to Take Stairs?: Yes Driving: Yes Vocation: Part time employment [place] ocurred. Works as Marketing executive. ADL ADL ADL Comments: refer to functional navigator Vision/Perception  Vision- History Baseline Vision/History: Wears glasses Wears Glasses: Reading only;Distance only Patient Visual Report:  Blurring of vision Vision- Assessment Vision Assessment?: Yes;Vision impaired- to be further tested in functional context Ocular Range of Motion: Restricted on the left Alignment/Gaze Preference: Gaze right Tracking/Visual Pursuits: Decreased smoothness of horizontal tracking;Decreased smoothness of vertical tracking Visual Fields: Left visual field deficit;Left homonymous hemianopsia  Cognition Overall Cognitive Status: Impaired/Different from baseline Arousal/Alertness: Awake/alert Orientation Level: Person;Place;Situation Person: Oriented Place: Oriented Situation: Oriented Year: 2017 Month: July Day of Week: Correct Memory: Appears intact Immediate Memory Recall: Sock;Blue;Bed Memory Recall: Sock;Blue;Bed Memory Recall Sock: Without Cue Memory Recall Blue: Without Cue Memory Recall Bed: Without Cue Awareness: Impaired Awareness Impairment: Anticipatory impairment;Emergent impairment Problem Solving: Impaired Problem Solving Impairment: Functional basic Safety/Judgment: Impaired Sensation Sensation Light Touch: Appears Intact Stereognosis: Impaired by gross assessment Hot/Cold: Appears Intact Proprioception Impaired Details: Impaired LUE;Impaired LLE Additional Comments: impaired localizaton over L hand Coordination Gross Motor Movements are Fluid and Coordinated: No Fine Motor Movements are Fluid and Coordinated: No Motor  Motor Motor: Abnormal tone Motor - Skilled Clinical Observations: mildly increased tone in LUE/LLE Mobility    refer to functional navigator Trunk/Postural Assessment  Postural Control Postural Control: Deficits on evaluation Trunk Control: L lateral lean (more impaired in sitting than standing) Righting Reactions: absent when losing balance to Lt  Balance Static Sitting Balance Static Sitting - Level of Assistance: 5: Stand by assistance Dynamic Sitting Balance Dynamic Sitting - Level of Assistance: 4: Min assist Static Standing  Balance Static Standing - Level of Assistance: 4: Min assist Dynamic Standing Balance Dynamic Standing - Level of Assistance: 3: Mod assist Extremity/Trunk Assessment RUE Assessment RUE Assessment: Within Functional Limits LUE Assessment LUE Assessment: Exceptions to [facility] LUE Strength LUE Overall Strength: Other (Comment) (AROM WFL, grasp good, shoulder strength limited due to sensory perceptual deficits)   See Function Navigator for Current Functional Status.   Refer to Care Plan for Long Term Goals  Recommendations for other services: None  Discharge Criteria: Patient will be discharged from OT if patient refuses treatment 3 consecutive times without medical reason, if treatment goals not met, if there is a change in medical status, if patient makes no progress towards goals or if patient is discharged from hospital.  The above assessment,  treatment plan, treatment alternatives and goals were discussed and mutually agreed upon: by patient and by family  Azaria Stegman 03/25/2016, 1:00 PM

## 2016-03-25 NOTE — H&P (View-Only) (Signed)
Physical Medicine and Rehabilitation Admission H&P   Chief Complaint  Patient presents with  . Stroke Symptoms  : HPI: Cynthia [name] is a 70 y.o. right handed female admitted 03/20/2016 with headache, left-sided weakness, and altered mental status and aphasia. There was a report of a fall from a first step of a ladder several days ago. Patient lives with husband in [place]. Independent prior to admission. 2 level home with bedroom on first floor. Husband can assist as needed. CT of the head showed acute anterior right frontal lobe intra-axial hemorrhage estimated hemorrhage volume 20 mL. Surrounding edema with mild regional mass effect. MRI of the brain showed late acute early subacute intraparenchymal hemorrhage in the right posterior frontal lobe with layering and surrounding edema. CTA of head and neck shows right frontal lobe bilobed hematoma spanning 3.2 x 4.3 x 3.7 cm with surrounding vasogenic edema and mass effect upon the right ventricle appearing similar to recent MRI CT.Angiogram of head and neck showed no significant stenosis of either vertebral artery or AVM. Marland Kitchen Maintain on a regular diet. Physical therapy evaluation completed 03/21/2016 with recommendations of physical medicine rehabilitation consult  ROS Constitutional: Negative for fever and chills.  HENT: Negative for hearing loss.  Eyes: Negative for blurred vision and double vision.  Respiratory: Negative for cough and shortness of breath.  Cardiovascular: Negative for chest pain, palpitations and leg swelling.  Gastrointestinal: Positive for constipation. Negative for nausea and vomiting.  Genitourinary: Negative for dysuria and hematuria.  Musculoskeletal: Positive for myalgias.  Neurological: Positive for speech change, weakness and headaches. Negative for seizures.  Psychiatric/Behavioral: Positive for depression.  All other systems reviewed and are negative   Past Medical  History  Diagnosis Date  . Colon polyps   . Osteoporosis   . Depression     history of   History reviewed. No pertinent past surgical history. Family History  Problem Relation Age of Onset  . Heart disease Mother   . Cancer Mother     lung   Social History:  reports that she has never smoked. She has never used smokeless tobacco. She reports that she does not drink alcohol or use illicit drugs. Allergies:  Allergies  Allergen Reactions  . Other Anaphylaxis    FIRE ANTS  . Penicillins Rash    Has patient had a PCN reaction causing immediate rash, facial/tongue/throat swelling, SOB or lightheadedness with hypotension: Unknown Has patient had a PCN reaction causing severe rash involving mucus membranes or skin necrosis: No Has patient had a PCN reaction that required hospitalization No Has patient had a PCN reaction occurring within the last 10 years: No If all of the above answers are "NO", then may proceed with Cephalosporin use.    Medications Prior to Admission  Medication Sig Dispense Refill  . ALPRAZolam (XANAX) 0.25 MG tablet TAKE 1 TABLET 3 TIMES DAILY AS NEEDED FOR ANXIETY (Patient taking differently: TAKE 1 TABLET 2 TIMES DAILY AS NEEDED FOR ANXIETY) 90 tablet 0  . Calcium Carbonate-Vitamin D (OS-CAL 500 + D PO) Take 1 tablet by mouth daily.     . meloxicam (MOBIC) 15 MG tablet TAKE 1 TABLET BY MOUTH EVERY DAY (Patient taking differently: TAKE 1 TABLET (15 mg) BY MOUTH EVERY DAY as needed for lower back pain) 30 tablet 2  . cetirizine (ZYRTEC) 10 MG tablet Take 10 mg by mouth daily as needed for allergies.     Marland Kitchen EPIPEN 2-PAK 0.3 MG/0.3ML SOAJ injection INJECT 0.3 MLS (0.3 MG TOTAL) INTO  THE MUSCLE ONCE. 2 Device 0  . ferrous sulfate 325 (65 FE) MG tablet Take 325 mg by mouth daily as needed (to improve hemoglobin).       Home: Home Living Family/patient expects to be discharged to:: Private  residence Living Arrangements: Spouse/significant other Available Help at Discharge: Family Type of Home: House Home Access: Stairs to enter Technical brewer of Steps: 4 Entrance Stairs-Rails: None Home Layout: Able to live on main level with bedroom/bathroom Bathroom Shower/Tub: Chiropodist: Standard Home Equipment: None Lives With: Spouse  Functional History: Prior Function Level of Independence: Independent Comments: was out at the [place] this past weekend before event ocurred. Works as Marketing executive.  Functional Status:  Mobility: Bed Mobility Overal bed mobility: Needs Assistance Bed Mobility: Supine to Sit Supine to sit: Mod assist General bed mobility comments: Pt able to bring LEs to EOB with VCs to look at L leg and bring to EOB. Assist for trunk from supine to sit and to scoot hips out to EOB. Transfers Overall transfer level: Needs assistance Equipment used: 2 person hand held assist Transfers: Sit to/from Stand Sit to Stand: Min assist, +2 physical assistance Stand pivot transfers: Mod assist, +2 physical assistance General transfer comment: Min assist +2 for sit to stand from EOB. Mod assist +2 required for functional mobility, Ambulation/Gait Ambulation/Gait assistance: Mod assist, +2 physical assistance Ambulation Distance (Feet): 12 Feet Assistive device: 2 person hand held assist Gait Pattern/deviations: Step-to pattern, Decreased stride length, Decreased dorsiflexion - left, Shuffle, Narrow base of support General Gait Details: patient with inital cues for advancement of LLE, manual faciliation of weight shift during mobility. +2 assist to maintain upright, cues for attention to Left side.  Gait velocity: decreased Gait velocity interpretation: Below normal speed for age/gender    ADL: ADL Overall ADL's : Needs assistance/impaired Eating/Feeding: Set up, Sitting Grooming: Minimal assistance, Sitting,  Oral care, Cueing for sequencing Grooming Details (indicate cue type and reason): VCs to visually scan to L side to find toothbrush/toothpaste. Pt able to perform brushing teeth but required max verbal cues for initiation and termination of functional activities. Upper Body Bathing: Minimal assitance, Sitting Lower Body Bathing: Moderate assistance, Sit to/from stand Upper Body Dressing : Minimal assistance, Sitting Lower Body Dressing: Moderate assistance, Sit to/from stand Lower Body Dressing Details (indicate cue type and reason): Pt attempted to don sock with VCs to use L hand to assist. Pt unable to get sock started on foot. Toilet Transfer: Moderate assistance, +2 for physical assistance, BSC (2 person hand held assist) Toilet Transfer Details (indicate cue type and reason): Simulated by sit to stand from EOB and short distance functional mobility in room. Toileting- Clothing Manipulation and Hygiene: Moderate assistance, Sit to/from stand Functional mobility during ADLs: Moderate assistance, +2 for physical assistance General ADL Comments: Pt with L inattention requiring max multimodal cues to initiation of L side.  Cognition: Cognition Overall Cognitive Status: Impaired/Different from baseline Arousal/Alertness: Awake/alert Orientation Level: Oriented X4 Attention: Sustained Sustained Attention: Appears intact Memory: Appears intact Awareness: Impaired Awareness Impairment: Emergent impairment, Anticipatory impairment, Intellectual impairment Problem Solving: Appears intact (verbal) Safety/Judgment: Impaired Cognition Arousal/Alertness: Awake/alert Behavior During Therapy: Flat affect Overall Cognitive Status: Impaired/Different from baseline Area of Impairment: Attention, Following commands, Safety/judgement, Awareness, Problem solving Current Attention Level: Focused Following Commands: Follows one step commands with increased time Safety/Judgement: Decreased awareness of  deficits Awareness: Emergent Problem Solving: Slow processing, Decreased initiation, Difficulty sequencing, Requires verbal cues, Requires tactile cues General Comments: Pt  with increased awareness to L sided deficits; reports that her brain is just not making the connection to move her L side.  Physical Exam: Blood pressure 166/66, pulse 64, temperature 98.9 F (37.2 C), temperature source Oral, resp. rate 14, height _0  (1.676 m), weight 69.6 kg (153 lb 7 oz), SpO2 98 %. Physical Exam Constitutional: She is oriented to person, place, and time. She appears well-developed and well-nourished.  HENT:  Head: Normocephalic and atraumatic.  Eyes:  Pupils reactive to light Right gaze preference  Neck: Normal range of motion. Neck supple. No thyromegaly present.  Cardiovascular: Normal rate and regular rhythm.  Respiratory: Effort normal and breath sounds normal. No respiratory distress.  GI: Soft. Bowel sounds are normal. She exhibits no distension.  Musculoskeletal: She exhibits no edema or tenderness.  Neurological: She is alert and oriented to person, place, and time.  Mood is flat.  Followed simple commands. Right gaze preferenceSensation intact to light touch DTRs symmetric Motor: RUE: 5/5 proximal to distal RLE: 4+/5 proximal to distal LUE: 0/5 LLE: 2 minus/5 hip and knee extensor synergy, trace toe flexion absent  Ankle dorsiflexion Sensation intact to light touch in bilateral upper and lower limbs  Skin: Skin is warm and dry    Lab Results Last 48 Hours    Results for orders placed or performed during the hospital encounter of 03/20/16 (from the past 48 hour(s))  Glucose, capillary Status: Abnormal   Collection Time: 03/21/16 10:27 PM  Result Value Ref Range   Glucose-Capillary 116 (H) 65 - 99 mg/dL  Hemoglobin A1c Status: None   Collection Time: 03/22/16 2:55 AM  Result Value Ref Range   Hgb A1c MFr Bld 5.3 4.8 - 5.6 %     Comment: (NOTE)  Pre-diabetes: 5.7 - 6.4  Diabetes: >6.4  Glycemic control for adults with diabetes: <7.0    Mean Plasma Glucose 105 mg/dL    Comment: (NOTE) Performed At: [facility] [place], [place] [id number] Lindon Romp MD RW:[id number]   Basic metabolic panel Status: Abnormal   Collection Time: 03/22/16 2:55 AM  Result Value Ref Range   Sodium 131 (L) 135 - 145 mmol/L   Potassium 3.6 3.5 - 5.1 mmol/L   Chloride 100 (L) 101 - 111 mmol/L   CO2 24 22 - 32 mmol/L   Glucose, Bld 107 (H) 65 - 99 mg/dL   BUN 7 6 - 20 mg/dL   Creatinine, Ser 0.63 0.44 - 1.00 mg/dL   Calcium 8.6 (L) 8.9 - 10.3 mg/dL   GFR calc non Af Amer >60 >60 mL/min   GFR calc Af Amer >60 >60 mL/min    Comment: (NOTE) The eGFR has been calculated using the CKD EPI equation. This calculation has not been validated in all clinical situations. eGFR's persistently <60 mL/min signify possible Chronic Kidney Disease.    Anion gap 7 5 - 15  CBC Status: Abnormal   Collection Time: 03/22/16 2:55 AM  Result Value Ref Range   WBC 8.8 4.0 - 10.5 K/uL   RBC 3.97 3.87 - 5.11 MIL/uL   Hemoglobin 11.9 (L) 12.0 - 15.0 g/dL   HCT 35.1 (L) 36.0 - 46.0 %   MCV 88.4 78.0 - 100.0 fL   MCH 30.0 26.0 - 34.0 pg   MCHC 33.9 30.0 - 36.0 g/dL   RDW 12.5 11.5 - 15.5 %   Platelets 253 150 - 400 K/uL  Glucose, capillary Status: Abnormal   Collection Time: 03/22/16 12:05 PM  Result  Value Ref Range   Glucose-Capillary 107 (H) 65 - 99 mg/dL   Comment 1 Notify RN    Comment 2 Document in Chart   Glucose, capillary Status: Abnormal   Collection Time: 03/22/16 4:29 PM  Result Value Ref Range   Glucose-Capillary 113 (H) 65 - 99 mg/dL   Comment 1 Notify RN    Comment 2 Document in Chart   Glucose, capillary Status:  Abnormal   Collection Time: 03/23/16 8:23 AM  Result Value Ref Range   Glucose-Capillary 104 (H) 65 - 99 mg/dL  Glucose, capillary Status: Abnormal   Collection Time: 03/23/16 11:34 AM  Result Value Ref Range   Glucose-Capillary 119 (H) 65 - 99 mg/dL      Imaging Results (Last 48 hours)    Ir Angio Intra Extracran Sel Com Carotid Innominate Bilat Mod Sed  03/23/2016 CLINICAL DATA: Headaches. Left-sided weakness. Right gaze deviation. Intracranial hemorrhage on CT scan of the brain. EXAM: BILATERAL COMMON CAROTID AND INNOMINATE ANGIOGRAPHY AND BILATERAL VERTEBRAL ARTERY ANGIOGRAMS PROCEDURE: Contrast: 60 mL ISOVUE-300 IOPAMIDOL (ISOVUE-300) INJECTION 61% Anesthesia/Sedation: Conscious sedation. Medications: Fentanyl 25 mcg IV. Following a full explanation of the procedure along with the potential associated complications, an informed witnessed consent was obtained. The right groin was prepped and draped in the usual sterile fashion. Thereafter using modified Seldinger technique, transfemoral access into the right common femoral artery was obtained without difficulty. Over a 0.035 inch guidewire, a 5 French Pinnacle sheath was inserted. Through this, and also over 0.035 inch guidewire, a 5 [place] JB 1 catheter was advanced to the aortic arch region and selectively positioned in the right common carotid artery, the right vertebral artery, the left common carotid artery and the left vertebral artery. There were no acute complications. The patient tolerated the procedure well. FINDINGS: The right vertebral artery origin is normal. The vessel is seen to opacify normally to the cranial skull base. Mild FMD-like changes are seen involving the vertical segment of the right vertebral artery at C1. Normal opacification is seen of the right posterior-inferior cerebellar artery and the right vertebrobasilar junction. The opacified portion of the basilar artery, the left posterior cerebral  artery, superior cerebellar arteries and the anterior-inferior cerebellar arteries is normal into the capillary and the venous phases. The right common carotid arteriogram demonstrates the right external carotid artery and its major branches to be widely patent. The right internal carotid artery at the bulb to the cranial skull base opacifies normally. The petrous, cavernous and the supraclinoid segments are widely patent. There is mild broad-based eccentric dilatation of the superior hypophyseal right ICA. A dominant right posterior cerebral artery is seen opacifying the right posterior cerebral artery distribution. The right middle cerebral artery and the right anterior cerebral artery are seen to opacify into the capillary and the venous phases. Mild mass-effect from right to left is seen of the right anterior cerebral artery A2 segment. A prominent focal area of hypoperfusion is seen involving the right frontal lobe with delayed capillary filling. There is mild to moderate abnormal prominence of the frontal orbital branch of the right anterior cerebral artery. The venous phase demonstrates no abnormal stasis of contrast within the cortical cerebral veins. The dural venous sinuses are widely patent. The left common carotid arteriogram demonstrates the left external carotid artery and its major branches to be widely patent. The left internal carotid artery at the bulb to the cranial skull base opacifies normally. The petrous, cavernous and the supraclinoid segments are widely patent. There is mild  focal outpouching of the right internal carotid artery superior hypophyseal region ICA. Flash filling of the left posterior cerebral artery via the left posterior communicating artery is seen. The left middle and the left anterior cerebral arteries opacify normally into the capillary and the venous phases. Cross opacification via the anterior communicating artery of the right anterior cerebral artery A2 segment is seen.  The left vertebral artery origin is normal. The vessel is seen to opacify normally to the cranial skull base. Mild FMD-like changes are seen involving the posterior aspect of the vertical segment of the left vertebral artery at the level of C1-C2. Distal to this, the left vertebrobasilar junction and the left posterior-inferior cerebellar arteries are seen to opacify normally. Wide patency is seen of the basilar artery, the left posterior cerebral artery, superior cerebellar arteries and the anterior-inferior cerebellar arteries into the capillary and the venous phases. Non-opacified blood is seen in the proximal basilar artery from the contralateral vertebral artery. Nonvisualization of the right posterior cerebral arteries related to prominent inflow from the anterior circulation via the posterior communicating artery as mentioned above. IMPRESSION: Angiographically no evidence of arteriovenous shunting, intracranial aneurysms, or arteriovenous malformation is seen on this study. Prominent area of hypoperfusion involving the right frontal lobe anteriorly with mass effect from right to left most likely related to the intracerebral hemorrhage seen on the CT scan. Prominent frontal polar branch of the right anterior cerebral artery A1 segment extending into the anterior inferior portion of the larger area of hyperperfusion. This could signify a potential feeder to a vascular malformation not angiographically visualized on today's study. Electronically Signed By: Luanne Bras M.D. On: 03/22/2016 10:26   Ir Angio Vertebral Sel Vertebral Bilat Mod Sed  03/23/2016 CLINICAL DATA: Headaches. Left-sided weakness. Right gaze deviation. Intracranial hemorrhage on CT scan of the brain. EXAM: BILATERAL COMMON CAROTID AND INNOMINATE ANGIOGRAPHY AND BILATERAL VERTEBRAL ARTERY ANGIOGRAMS PROCEDURE: Contrast: 60 mL ISOVUE-300 IOPAMIDOL (ISOVUE-300) INJECTION 61% Anesthesia/Sedation: Conscious sedation. Medications:  Fentanyl 25 mcg IV. Following a full explanation of the procedure along with the potential associated complications, an informed witnessed consent was obtained. The right groin was prepped and draped in the usual sterile fashion. Thereafter using modified Seldinger technique, transfemoral access into the right common femoral artery was obtained without difficulty. Over a 0.035 inch guidewire, a 5 French Pinnacle sheath was inserted. Through this, and also over 0.035 inch guidewire, a 5 [place] JB 1 catheter was advanced to the aortic arch region and selectively positioned in the right common carotid artery, the right vertebral artery, the left common carotid artery and the left vertebral artery. There were no acute complications. The patient tolerated the procedure well. FINDINGS: The right vertebral artery origin is normal. The vessel is seen to opacify normally to the cranial skull base. Mild FMD-like changes are seen involving the vertical segment of the right vertebral artery at C1. Normal opacification is seen of the right posterior-inferior cerebellar artery and the right vertebrobasilar junction. The opacified portion of the basilar artery, the left posterior cerebral artery, superior cerebellar arteries and the anterior-inferior cerebellar arteries is normal into the capillary and the venous phases. The right common carotid arteriogram demonstrates the right external carotid artery and its major branches to be widely patent. The right internal carotid artery at the bulb to the cranial skull base opacifies normally. The petrous, cavernous and the supraclinoid segments are widely patent. There is mild broad-based eccentric dilatation of the superior hypophyseal right ICA. A dominant right posterior cerebral artery is  seen opacifying the right posterior cerebral artery distribution. The right middle cerebral artery and the right anterior cerebral artery are seen to opacify into the capillary and the venous  phases. Mild mass-effect from right to left is seen of the right anterior cerebral artery A2 segment. A prominent focal area of hypoperfusion is seen involving the right frontal lobe with delayed capillary filling. There is mild to moderate abnormal prominence of the frontal orbital branch of the right anterior cerebral artery. The venous phase demonstrates no abnormal stasis of contrast within the cortical cerebral veins. The dural venous sinuses are widely patent. The left common carotid arteriogram demonstrates the left external carotid artery and its major branches to be widely patent. The left internal carotid artery at the bulb to the cranial skull base opacifies normally. The petrous, cavernous and the supraclinoid segments are widely patent. There is mild focal outpouching of the right internal carotid artery superior hypophyseal region ICA. Flash filling of the left posterior cerebral artery via the left posterior communicating artery is seen. The left middle and the left anterior cerebral arteries opacify normally into the capillary and the venous phases. Cross opacification via the anterior communicating artery of the right anterior cerebral artery A2 segment is seen. The left vertebral artery origin is normal. The vessel is seen to opacify normally to the cranial skull base. Mild FMD-like changes are seen involving the posterior aspect of the vertical segment of the left vertebral artery at the level of C1-C2. Distal to this, the left vertebrobasilar junction and the left posterior-inferior cerebellar arteries are seen to opacify normally. Wide patency is seen of the basilar artery, the left posterior cerebral artery, superior cerebellar arteries and the anterior-inferior cerebellar arteries into the capillary and the venous phases. Non-opacified blood is seen in the proximal basilar artery from the contralateral vertebral artery. Nonvisualization of the right posterior cerebral arteries related to  prominent inflow from the anterior circulation via the posterior communicating artery as mentioned above. IMPRESSION: Angiographically no evidence of arteriovenous shunting, intracranial aneurysms, or arteriovenous malformation is seen on this study. Prominent area of hypoperfusion involving the right frontal lobe anteriorly with mass effect from right to left most likely related to the intracerebral hemorrhage seen on the CT scan. Prominent frontal polar branch of the right anterior cerebral artery A1 segment extending into the anterior inferior portion of the larger area of hyperperfusion. This could signify a potential feeder to a vascular malformation not angiographically visualized on today's study. Electronically Signed By: Luanne Bras M.D. On: 03/22/2016 10:26        Medical Problem List and Plan: 1. Left hemiplegia secondary to right frontal ICH 2. DVT Prophylaxis/Anticoagulation: SCDs. Monitor for signs of DVT 3. Pain Management: Tylenol as needed 4. Mood: Xanax 0.25 mg twice daily as needed for 5. Neuropsych: This patient is capable of making decisions on her own behalf. 6. Skin/Wound Care: Routine skin checks 7. Fluids/Electrolytes/Nutrition: Routine I&O's with follow-up chemistries      Post Admission Physician Evaluation: 1. Functional deficits secondary to Left hemiplegia secondary to right frontal ICH. 2. Patient is admitted to receive collaborative, interdisciplinary care between the physiatrist, rehab nursing staff, and therapy team. 3. Patient's level of medical complexity and substantial therapy needs in context of that medical necessity cannot be provided at a lesser intensity of care such as a SNF. 4. Patient has experienced substantial functional loss from his/her baseline which was documented above under the "Functional History" and "Functional Status" headings. Judging by the patient's diagnosis,  physical exam, and functional history, the patient has  potential for functional progress which will result in measurable gains while on inpatient rehab. These gains will be of substantial and practical use upon discharge in facilitating mobility and self-care at the household level. 5. Physiatrist will provide 24 hour management of medical needs as well as oversight of the therapy plan/treatment and provide guidance as appropriate regarding the interaction of the two. 6. 24 hour rehab nursing will assist with bladder management, bowel management, safety, skin/wound care, disease management, medication administration, pain management and patient education and help integrate therapy concepts, techniques,education, etc. 7. PT will assess and treat for/with: pre gait, gait training, endurance , safety, equipment, neuromuscular re education. Goals are: minA. 8. OT will assess and treat for/with: ADLs, Cognitive perceptual skills, Neuromuscular re education, safety, endurance, equipment. Goals are: Min A. Therapy may proceed with showering this patient. 9. SLP will assess and treat for/with: memory, attention, problem solving, thought organization. Goals are: Min assist. 10. Case Management and Social Worker will assess and treat for psychological issues and discharge planning. 11. Team conference will be held weekly to assess progress toward goals and to determine barriers to discharge. 12. Patient will receive at least 3 hours of therapy per day at least 5 days per week. 13. ELOS: 18-21 days  14. Prognosis: good     Charlett Blake M.D. [facility] FAAPM&R (Sports Med, Neuromuscular Med) Diplomate Am Board of Electrodiagnostic Med  03/23/2016

## 2016-03-25 NOTE — Evaluation (Signed)
Physical Therapy Assessment and Plan  Patient Details  Name: Cynthia [name] MRN: [id number] Date of Birth: 03/02/1946  PT Diagnosis: Abnormality of gait, Difficulty walking, Hypertonia, Impaired cognition and Muscle weakness Rehab Potential: Good ELOS: 10-14 days   Today's Date: 03/25/2016 PT Individual Time: 2440-1027 PT Individual Time Calculation (min): 70 min    Problem List:  Patient Active Problem List   Diagnosis Date Noted  . Vascular headache   . Benign essential HTN   . Hyponatremia   . Acute blood loss anemia   . TBI (traumatic brain injury) ([place])   . Intracerebral hemorrhage 03/20/2016  . ICH (intracerebral hemorrhage) ([place]) 03/20/2016  . Colon polyps   . Osteoporosis   . HTN (hypertension) 01/08/2013    Past Medical History:  Past Medical History  Diagnosis Date  . Colon polyps   . Osteoporosis   . Depression     history of   Past Surgical History: No past surgical history on file.  Assessment & Plan Clinical Impression: Patient is a 70 y.o. year old female with recent admission to the hospital on 03/20/2016 with headache, left-sided weakness, and altered mental status and aphasia. There was a report of a fall from a first step of a ladder several days ago. CT of the head showed acute anterior right frontal lobe intra-axial hemorrhage.  Patient transferred to CIR on 03/24/2016 .   Patient currently requires min with mobility secondary to muscle weakness, abnormal tone and decreased motor planning, decreased attention, decreased awareness and decreased problem solving and decreased sitting balance, decreased standing balance, decreased postural control and decreased balance strategies.  Prior to hospitalization, patient was independent  with mobility and lived with Spouse in a House home.  Home access is 4Stairs to enter.  Patient will benefit from skilled PT intervention to maximize safe functional mobility, minimize fall risk and decrease caregiver burden for  planned discharge home with 24 hour supervision.  Anticipate patient will benefit from follow up OP at discharge.  PT - End of Session Activity Tolerance: Tolerates 30+ min activity with multiple rests Endurance Deficit: Yes PT Assessment Rehab Potential (ACUTE/IP ONLY): Good PT Patient demonstrates impairments in the following area(s): Balance;Endurance;Motor;Perception;Safety PT Transfers Functional Problem(s): Bed Mobility;Bed to Chair;Car;Furniture PT Locomotion Functional Problem(s): Stairs;Ambulation PT Plan PT Intensity: Minimum of 1-2 x/day ,45 to 90 minutes PT Frequency: 5 out of 7 days PT Duration Estimated Length of Stay: 10-14 days PT Treatment/Interventions: Ambulation/gait training;Discharge planning;Functional mobility training;Therapeutic Activities;Wheelchair propulsion/positioning;Therapeutic Exercise;Neuromuscular re-education;Balance/vestibular training;Cognitive remediation/compensation;Community reintegration;Functional electrical stimulation;Patient/family education;Stair training;UE/LE Coordination activities;UE/LE Strength taining/ROM;Splinting/orthotics;Pain management;DME/adaptive equipment instruction PT Transfers Anticipated Outcome(s): supervision PT Locomotion Anticipated Outcome(s): supervision gait PT Recommendation Follow Up Recommendations: Outpatient PT Patient destination: Home Equipment Recommended: To be determined  Skilled Therapeutic Intervention Pt rec'd eating breakfast, needed verbal and visual cues to attend to Lt side of plate.  Stand pivot transfers with min A, pt able to advance Lt LE without assistance.  Car transfer to simulated sedan height car with mod A due to motor planning issues with transfer to left.  Stair negotiation x 4 stairs with 1 handrail with min A, cuing for attention to Lt LE placement.  Pt tends to drift to Rt when performing gait due to Lt inattention, requires verbal and tactile cues for Lt attention in functional  situations.  PT Evaluation Precautions/Restrictions Precautions Precautions: Fall Precaution Comments: left inattention Restrictions Weight Bearing Restrictions: No Pain Pain Assessment Pain Assessment: No/denies pain Home Living/Prior Functioning Home Living Available Help at Discharge: Family Type  of Home: House Home Access: Stairs to enter [employer] of Steps: 4 Entrance Stairs-Rails: None Home Layout: Able to live on main level with bedroom/bathroom  Lives With: Spouse Prior Function Level of Independence: Independent with gait;Independent with basic ADLs;Independent with homemaking with ambulation  Able to Take Stairs?: Yes Driving: Yes Vocation: Part time employment  Cognition Overall Cognitive Status: Impaired/Different from baseline Awareness: Impaired Awareness Impairment: Anticipatory impairment;Emergent impairment Safety/Judgment: Impaired Sensation Sensation Light Touch: Appears Intact Proprioception: Impaired Detail Proprioception Impaired Details: Impaired LUE;Impaired LLE Additional Comments: Lt inattention Coordination Gross Motor Movements are Fluid and Coordinated: No Motor  Motor Motor: Abnormal tone Motor - Skilled Clinical Observations: mildly increased tone Lt LE   Trunk/Postural Assessment  Cervical Assessment Cervical Assessment:  (cervical rotation to Rt at rest due to Lt inattention) Thoracic Assessment Thoracic Assessment:  (mild kyphosis) Lumbar Assessment Lumbar Assessment:  (posterior pelvic tilt) Postural Control Postural Control: Deficits on evaluation Righting Reactions: absent when losign balance to Lt  Balance Dynamic Sitting Balance Sitting balance - Comments: Lt lean in static sitting, min A for dynamic sitting blance due to LOB to Lt Extremity Assessment      RLE Assessment RLE Assessment: Within Functional Limits LLE Assessment LLE Assessment:  (grossly 3/5, limited DF ROM)   See Function Navigator  for Current Functional Status.   Refer to Care Plan for Long Term Goals  Recommendations for other services: None  Discharge Criteria: Patient will be discharged from PT if patient refuses treatment 3 consecutive times without medical reason, if treatment goals not met, if there is a change in medical status, if patient makes no progress towards goals or if patient is discharged from hospital.  The above assessment, treatment plan, treatment alternatives and goals were discussed and mutually agreed upon: by patient  [facility] 03/25/2016, 8:44 AM

## 2016-03-25 NOTE — Interval H&P Note (Signed)
Cynthia [name] was admitted today to Inpatient Rehabilitation with the diagnosis of Right frontal ICH.  The patient's history has been reviewed, patient examined, and there is no change in status.  Patient continues to be appropriate for intensive inpatient rehabilitation.  I have reviewed the patient's chart and labs.  Questions were answered to the patient's satisfaction. The PAPE has been reviewed and assessment remains appropriate.  Charlett Blake 03/25/2016, 8:38 AM

## 2016-03-25 NOTE — IPOC Note (Signed)
Overall Plan of Care [facility]) Patient Details Name: Cynthia [name] MRN: [id number] DOB: [birth year]  Admitting Diagnosis: Stroke  Hospital Problems: Active Problems:   ICH (intracerebral hemorrhage) ([place])     Functional Problem List: Nursing Endurance, Medication Management, Motor, Nutrition, Pain, Perception, Safety, Skin Integrity, Sensory  PT Balance, Endurance, Motor, Perception, Safety  OT Balance, Cognition, Endurance, Motor, Perception, Safety, Sensory, Vision  SLP Cognition, Linguistic  TR         Basic ADL's: OT Eating, Grooming, Bathing, Dressing, Toileting     Advanced  ADL's: OT       Transfers: PT Bed Mobility, Bed to Chair, Car, Manufacturing systems engineer, Metallurgist: PT Stairs, Ambulation     Additional Impairments: OT Fuctional Use of Upper Extremity  SLP Communication, Social Cognition expression Problem Solving, Memory, Attention, Awareness  TR      Anticipated Outcomes Item Anticipated Outcome  Self Feeding I  Swallowing      Basic self-care  supervision  Toileting  supervision   Bathroom Transfers supervision  Bowel/Bladder  To remain continent  Transfers  supervision  Locomotion  supervision gait  Communication  mod I   Cognition  supervision   Pain  <3  Safety/Judgment  Mod I assist   Therapy Plan: PT Intensity: Minimum of 1-2 x/day ,45 to 90 minutes PT Frequency: 5 out of 7 days PT Duration Estimated Length of Stay: 10-14 days OT Intensity: Minimum of 1-2 x/day, 45 to 90 minutes OT Frequency: 5 out of 7 days OT Duration/Estimated Length of Stay: 12-14 days SLP Intensity: Minumum of 1-2 x/day, 30 to 90 minutes SLP Frequency: 3 to 5 out of 7 days SLP Duration/Estimated Length of Stay: 10-14 days        Team Interventions: Nursing Interventions Patient/Family Education, Cognitive Remediation/Compensation, Pain Management, Discharge Planning, Skin Care/Wound Management, Medication Management, Psychosocial  Support  PT interventions Ambulation/gait training, Discharge planning, Functional mobility training, Therapeutic Activities, Wheelchair propulsion/positioning, Therapeutic Exercise, Neuromuscular re-education, Training and development officer, Cognitive remediation/compensation, Community reintegration, Technical sales engineer stimulation, Patient/family education, IT trainer, UE/LE Coordination activities, UE/LE Strength taining/ROM, Splinting/orthotics, Pain management, DME/adaptive equipment instruction  OT Interventions Training and development officer, Cognitive remediation/compensation, Discharge planning, Community reintegration, DME/adaptive equipment instruction, Functional mobility training, Neuromuscular re-education, Psychosocial support, Patient/family education, Self Care/advanced ADL retraining, Therapeutic Activities, Therapeutic Exercise, UE/LE Strength taining/ROM, UE/LE Coordination activities, Visual/perceptual remediation/compensation  SLP Interventions Cognitive remediation/compensation, Cueing hierarchy, Functional tasks, Environmental controls, Internal/external aids, Patient/family education  TR Interventions    SW/CM Interventions Discharge Planning, Psychosocial Support, Patient/Family Education    Team Discharge Planning: Destination: PT-Home ,OT- Home , SLP-Home Projected Follow-up: PT-Outpatient PT, OT-  Home health OT, SLP-24 hour supervision/assistance, Outpatient SLP, Home Health SLP Projected Equipment Needs: PT-To be determined, OT- Tub/shower seat, SLP-None recommended by SLP Equipment Details: PT- , OT-  Patient/family involved in discharge planning: PT- Patient,  OT-Patient, Family member/caregiver, SLP-Patient, Family member/caregiver  MD ELOS: 14-18d Medical Rehab Prognosis:  Good Assessment: 70 y.o. right handed female admitted 03/20/2016 with headache, left-sided weakness, and altered mental status and aphasia. There was a report of a fall from a first step of a  ladder several days ago. Patient lives with husband in [place]. Independent prior to admission. 2 level home with bedroom on first floor. Husband can assist as needed. CT of the head showed acute anterior right frontal lobe intra-axial hemorrhage estimated hemorrhage volume 20 mL. Surrounding edema with mild regional mass effect. MRI of the brain showed late acute early subacute intraparenchymal  hemorrhage in the right posterior frontal lobe with layering and surrounding edema. CTA of head and neck shows right frontal lobe bilobed hematoma spanning 3.2 x 4.3 x 3.7 cm with surrounding vasogenic edema and mass effect upon the right ventricle appearing similar to recent MRI CT.Angiogram of head and neck showed no significant stenosis of either vertebral artery or AVM   Now requiring 24/7 Rehab RN,MD, as well as CIR level PT, OT and SLP.  Treatment team will focus on ADLs and mobility with goals set at sup/modI  See Team Conference Notes for weekly updates to the plan of care

## 2016-03-25 NOTE — Progress Notes (Signed)
Patient information reviewed and entered into eRehab system by Alida Greiner, RN, CRRN, PPS Coordinator.  Information including medical coding and functional independence measure will be reviewed and updated through discharge.     Per nursing patient was given "Data Collection Information Summary for Patients in Inpatient Rehabilitation Facilities with attached "Privacy Act Statement-Health Care Records" upon admission.  

## 2016-03-25 NOTE — Evaluation (Signed)
Speech Language Pathology Assessment and Plan  Patient Details  Name: Cesilia Shinn MRN: [id number] Date of Birth: [birth year]  SLP Diagnosis: Dysarthria;Cognitive Impairments;Speech and Language deficits  Rehab Potential: Good ELOS: 10-14 days     Today's Date: 03/25/2016 SLP Individual Time: 1340-1440 SLP Individual Time Calculation (min): 60 min   Problem List:  Patient Active Problem List   Diagnosis Date Noted  . Vascular headache   . Benign essential HTN   . Hyponatremia   . Acute blood loss anemia   . TBI (traumatic brain injury) ([place])   . Intracerebral hemorrhage 03/20/2016  . ICH (intracerebral hemorrhage) ([place]) 03/20/2016  . Colon polyps   . Osteoporosis   . HTN (hypertension) 01/08/2013   Past Medical History:  Past Medical History  Diagnosis Date  . Colon polyps   . Osteoporosis   . Depression     history of   Past Surgical History: No past surgical history on file.  Assessment / Plan / Recommendation Clinical Impression   Elene Downum is a 70 y.o. right handed female admitted 03/20/2016 with headache, left-sided weakness, and altered mental status and aphasia. CT of the head showed acute anterior right frontal lobe intra-axial hemorrhage estimated hemorrhage volume 20 mL. Surrounding edema with mild regional mass effect. MRI of the brain showed late acute early subacute intraparenchymal hemorrhage in the right posterior frontal lobe with layering and surrounding edema. Pt admitted to CIR on 03/24/2016.  SLP evaluation completed on 03/25/2016 with the following results:  Pt presents with moderate cognitive impairment consistent with right brain dysfunction characterized by left inattention, decreased emergent awareness of deficits, slowed processing speed with decreased initiation of tasks and verbal responses, impaired recall of daily information, and decreased functional problem solving.  Pt also with mild dysarthria resulting from left sided oral motor  deficits.  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  Pt also reports word finding difficulty which SLP suspects to be secondary to cognitive impairment rather than a primary impairment. Pt was fully independent prior to admission and has experienced a loss in function as a result of the abovementioned deficits.  As a result, pt would benefit from skilled ST while inpatient in order to maximize functional independence and reduce burden of care prior to discharge.  Anticipate that pt will need 24/7 supervision at discharge in addition to [place] follow up at next level of care.    Skilled Therapeutic Interventions           Cognitive-linguistic evaluation completed with results and recommendations reviewed with patient and family.   SLP discussed right brain dysfunction as it correlates to current cognitive-linguistic limitations. SLP also provided skilled education regarding realistic prognostic expectations for recovery given progress made thus far and recommendations for 24/7 supervision at home.  Pt reports husband will be able to provide recommended level of assistance.  All questions were answered to pt's and family's satisfaction at this time.      SLP Assessment  Patient will need skilled [facility] during CIR admission    Recommendations  Recommendations for Other Services: Neuropsych consult Patient destination: Home Follow up Recommendations: 24 hour supervision/assistance;Outpatient SLP;Home Health SLP Equipment Recommended: None recommended by SLP    SLP Frequency 3 to 5 out of 7 days   SLP Duration  SLP Intensity  SLP Treatment/Interventions 10-14 days   Minumum of 1-2 x/day, 30 to  90 minutes  Cognitive remediation/compensation;Cueing hierarchy;Functional tasks;Environmental controls;Internal/external aids;Patient/family  education    Pain Pain Assessment Pain Assessment: No/denies pain  Prior Functioning Cognitive/Linguistic Baseline: Within functional limits Type of Home: House  Lives With: Spouse Available Help at Discharge: Family Vocation: Part time employment  Function:  Eating Eating                 Cognition Comprehension Comprehension assist level: Follows basic conversation/direction with extra time/assistive device  Expression   Expression assist level: Expresses basic 75 - 89% of the time/requires cueing 10 - 24% of the time. Needs helper to occlude trach/needs to repeat words.  Social Interaction Social Interaction assist level: Interacts appropriately 75 - 89% of the time - Needs redirection for appropriate language or to initiate interaction.  Problem Solving Problem solving assist level: Solves basic 75 - 89% of the time/requires cueing 10 - 24% of the time  Memory Memory assist level: Recognizes or recalls 75 - 89% of the time/requires cueing 10 - 24% of the time   Short Term Goals: Week 1: SLP Short Term Goal 1 (Week 1): Pt will attend to the left of the environment during familiar, functional tasks with supervision verbal cues.  SLP Short Term Goal 2 (Week 1): Pt will recall daily information with supervision verbal cues for use of external aids.   SLP Short Term Goal 3 (Week 1): Pt will complete semi-complex tasks with supervision verbal cues for functional problem solving.   SLP Short Term Goal 4 (Week 1): Pt will utilize overarticulation to achieve articulatory precision at the conversational level with mod I.  SLP Short Term Goal 5 (Week 1): Pt will utilize compensatory strategies to facilitate semi-complex word finding at the conversational level wtih mod I.    Refer to Care Plan for Long Term Goals  Recommendations for other services: Neuropsych  Discharge Criteria: Patient will be discharged from SLP if patient refuses treatment 3 consecutive times without medical  reason, if treatment goals not met, if there is a change in medical status, if patient makes no progress towards goals or if patient is discharged from hospital.  The above assessment, treatment plan, treatment alternatives and goals were discussed and mutually agreed upon: by patient and by family  Emilio Math 03/25/2016, 4:01 PM

## 2016-03-26 ENCOUNTER — Inpatient Hospital Stay (HOSPITAL_COMMUNITY): Payer: Medicare Other | Admitting: Occupational Therapy

## 2016-03-26 ENCOUNTER — Inpatient Hospital Stay (HOSPITAL_COMMUNITY): Payer: Medicare Other | Admitting: *Deleted

## 2016-03-26 ENCOUNTER — Inpatient Hospital Stay (HOSPITAL_COMMUNITY): Payer: Medicare Other | Admitting: Speech Pathology

## 2016-03-26 DIAGNOSIS — I615 Nontraumatic intracerebral hemorrhage, intraventricular: Secondary | ICD-10-CM

## 2016-03-26 LAB — BASIC METABOLIC PANEL
ANION GAP: 7 (ref 5–15)
BUN: 9 mg/dL (ref 6–20)
CALCIUM: 9.2 mg/dL (ref 8.9–10.3)
CHLORIDE: 103 mmol/L (ref 101–111)
CO2: 29 mmol/L (ref 22–32)
Creatinine, Ser: 0.64 mg/dL (ref 0.44–1.00)
GFR calc non Af Amer: >60 mL/min (ref >60)
Glucose, Bld: 99 mg/dL (ref 65–99)
Potassium: 3.4 mmol/L — ABNORMAL LOW (ref 3.5–5.1)
SODIUM: 139 mmol/L (ref 135–145)

## 2016-03-26 NOTE — Progress Notes (Signed)
Patient ID: Cynthia [name], female   DOB: 24-Apr-1946, 70 y.o.   MRN: [id number]  03/26/16.  70 year old patient admitted for CIR with  Left hemiplegia secondary to right frontal ICH.  Past Medical History  Diagnosis Date  . Colon polyps   . Osteoporosis   . Depression     history of      Subjective/Complaints: No new issues overnite Feels well with good night's change order to discontinue that and start this is [name] in 2  ROS  Denies CP, SOB, N/V/D  Objective: Vital Signs: Blood pressure 153/71, pulse 85, temperature 98.4 F (36.9 C), temperature source Oral, resp. rate 18, height 5' 6" (1.676 m), weight 155 lb (70.308 kg), SpO2 99 %. No results found. Results for orders placed or performed during the hospital encounter of 03/24/16 (from the past 72 hour(s))  CBC WITH DIFFERENTIAL     Status: Abnormal   Collection Time: 03/25/16  5:28 AM  Result Value Ref Range   WBC 9.2 4.0 - 10.5 K/uL   RBC 3.96 3.87 - 5.11 MIL/uL   Hemoglobin 12.1 12.0 - 15.0 g/dL   HCT 34.4 (L) 36.0 - 46.0 %   MCV 86.9 78.0 - 100.0 fL   MCH 30.6 26.0 - 34.0 pg   MCHC 35.2 30.0 - 36.0 g/dL   RDW 12.8 11.5 - 15.5 %   Platelets 279 150 - 400 K/uL   Neutrophils Relative % 73 %   Neutro Abs 6.7 1.7 - 7.7 K/uL   Lymphocytes Relative 21 %   Lymphs Abs 1.9 0.7 - 4.0 K/uL   Monocytes Relative 6 %   Monocytes Absolute 0.6 0.1 - 1.0 K/uL   Eosinophils Relative 0 %   Eosinophils Absolute 0.0 0.0 - 0.7 K/uL   Basophils Relative 0 %   Basophils Absolute 0.0 0.0 - 0.1 K/uL  Comprehensive metabolic panel     Status: Abnormal   Collection Time: 03/25/16  5:28 AM  Result Value Ref Range   Sodium 139 135 - 145 mmol/L   Potassium 2.9 (L) 3.5 - 5.1 mmol/L   Chloride 99 (L) 101 - 111 mmol/L   CO2 29 22 - 32 mmol/L   Glucose, Bld 101 (H) 65 - 99 mg/dL   BUN <5 (L) 6 - 20 mg/dL   Creatinine, Ser 0.61 0.44 - 1.00 mg/dL   Calcium 9.1 8.9 - 10.3 mg/dL   Total Protein 6.2 (L) 6.5 - 8.1 g/dL   Albumin 3.4  (L) 3.5 - 5.0 g/dL   AST 45 (H) 15 - 41 U/L   ALT 30 14 - 54 U/L   Alkaline Phosphatase 80 38 - 126 U/L   Total Bilirubin 0.7 0.3 - 1.2 mg/dL   GFR calc non Af Amer >60 >60 mL/min   GFR calc Af Amer >60 >60 mL/min    Comment: (NOTE) The eGFR has been calculated using the CKD EPI equation. This calculation has not been validated in all clinical situations. eGFR's persistently <60 mL/min signify possible Chronic Kidney Disease.    Anion gap 11 5 - 15  Basic metabolic panel     Status: Abnormal   Collection Time: 03/26/16  7:42 AM  Result Value Ref Range   Sodium 139 135 - 145 mmol/L   Potassium 3.4 (L) 3.5 - 5.1 mmol/L   Chloride 103 101 - 111 mmol/L   CO2 29 22 - 32 mmol/L   Glucose, Bld 99 65 - 99 mg/dL   BUN 9 6 - 20 mg/dL  Creatinine, Ser 0.64 0.44 - 1.00 mg/dL   Calcium 9.2 8.9 - 10.3 mg/dL   GFR calc non Af Amer >60 >60 mL/min   GFR calc Af Amer >60 >60 mL/min    Comment: (NOTE) The eGFR has been calculated using the CKD EPI equation. This calculation has not been validated in all clinical situations. eGFR's persistently <60 mL/min signify possible Chronic Kidney Disease.    Anion gap 7 5 - 15     BP Readings from Last 3 Encounters:  03/26/16 153/71  03/24/16 157/75  11/11/15 136/82    Constitutional: She is oriented to person, place, and time. She appears well-developed and well-nourished.  HENT:  Head: Normocephalic and atraumatic.  Eyes:  Pupils reactive to light Right gaze preference  Neck: Normal range of motion. Neck supple. No thyromegaly present.  Cardiovascular: Normal rate and regular rhythm.  Respiratory: Effort normal and breath sounds normal. No respiratory distress.  GI: Soft. Bowel sounds are normal. She exhibits no distension.  Musculoskeletal: She exhibits no edema or tenderness.  Neurological: She is alert and oriented to person, place, and time.   alert and appropriate  Extr- SCDs in place  Right gaze preferenceSensation intact  to light touch DTRs symmetric Motor: RUE: 5/5 proximal to distal RLE: 4+/5 proximal to distal LUE: 0/5 LLE: 2 minus/5 hip and knee extensor synergy, trace toe flexion absent Ankle dorsiflexion  Medical Problem List and Plan: 1.  Left hemiplegia secondary to right frontal ICH- CIR PT, OT, SLP 2.  DVT Prophylaxis/Anticoagulation: SCDs. Monitor for signs of DVT  3. Mood: Xanax 0.25 mg twice daily as needed for   4. Fluids/Electrolytes/Nutrition: Routine I&O's with follow-up chemistries, Hypo K will supplement   LOS (Days) 2 A FACE TO FACE EVALUATION WAS PERFORMED  Cynthia [name] 03/26/2016, 11:00 AM

## 2016-03-26 NOTE — Progress Notes (Signed)
Occupational Therapy Session Note  Patient Details  Name: Cynthia [name] MRN: [id number] Date of Birth: 08/11/1946  Today's Date: 03/26/2016 OT Individual Time:  -   1000-1100  (60 min)  1st session                                           1500-1545  (45 min)  2nd session      Short Term Goals: Week 1:  OT Short Term Goal 1 (Week 1): Pt will demonstrate improved L visual awareness by donning L arm in shirt first without cues. OT Short Term Goal 2 (Week 1): Pt will be able to pull underwear over hips with touching A. OT Short Term Goal 3 (Week 1): Pt will bathe self with steadying A. OT Short Term Goal 4 (Week 1): Pt will demonstrate improved dynamic sitting balance during bathing.  Skilled Therapeutic Interventions/Progress Updates:    1st session:  Addressed functional mobility, attention to left, LUE NMRE.  Allowed ppt increased time to pick out garments on left side of her body.  Max verbal cues to attend to L side (visually and attention to task).  Pt transferred to tub transfer bench with mod assist and cues for safety.  Ppt completed bathing and then back to wc to dress.  She dressed with increased time and min questioning cues for L body placement in garment. Pt verbalized her fixation on a task and unable to stop.  OT explained about perseveration and pt verbalized understanding.  She completed all dressing and was left in wc with safety belt on.  At one point, pt  with L lean in sitting , but was able to correct with min verbal cues.    2nd session:  Addressed LUE NMRE, standing balance, scanning and attention to left.  Used dynavision for visual motor and attention to left.  Pt able to demonstrate a 3.19 reaction time in general.   Her slowest reaction time was the upper left quadrants.  Pt performed in standing with good balance reactions.  Ambulated back to room with left leg tripping over leg of table but pt able to demonstrate good balance reaction.  Pt's attention to left  diminished with increased fatigue.  Returned to room with ppt finding way with increased time and minimal verbal cues.   Pt returned to bed and left with all needs in place and son present.    Therapy Documentation Precautions:  Precautions Precautions: Fall Precaution Comments: left inattention Restrictions Weight Bearing Restrictions: No      Pain:   none   ADL: ADL ADL Comments: refer to functional navigator  See Function Navigator for Current Functional Status.   Therapy/Group: Individual Therapy  Lisa Roca 03/26/2016, 10:20 AM

## 2016-03-26 NOTE — Progress Notes (Signed)
Physical Therapy Session Note  Patient Details  Name: Cynthia [name] MRN: [id number] Date of Birth: 1946/05/27  Today's Date: 03/26/2016 PT Individual Time: 1330-1430 PT Individual Time Calculation (min): 60 min   Short Term Goals: Week 1:  PT Short Term Goal 1 (Week 1): pt will perform gait 50' in controlled environment with supervision PT Short Term Goal 2 (Week 1): pt will maintain dynamic sitting balance for functional task with supervision PT Short Term Goal 3 (Week 1): pt will demo anticipatory awareness with min cuing  Skilled Therapeutic Interventions/Progress Updates:    Tx focused on functional mobility training, gait with HHA, and NMR via cognitive remediation, forced use, manual facilitation, and multi-modal cues. Pt up in [facility] upon arrival, family present. She is fatigue but agreeable to PT.   Gait in controlled setting 3x150' with min HHA and multi-modal cues/manual facilitation for R trunk shortening/L lengthening. Gait marked by narrow BOS, especially during turns. Pt unable to attend to L turns towards room without cues. Gait challenged with multi-tasking: ball toss, counting backwards, and reading signs.   Stair training x12 with Min A and bil rails in reciprocal pattern. Pt needed manual facilitation for trunk rotation and LUE management.    NMR Pt engaged in L-sided attention tasks throughout tx including  - sorting and counting bean bags on L during seated rest - LUE use throughout all functional mobility during transfers, stairs, and ball toss - NMR with LUE and bil LEs x8 min, challenge to increase pace to >45spm - Reading room numbers on L side during gait Cognitive remediation via task initiation and completion, multi-tasking alphabet foods while tossing ball with bil UEs; problem solving basic functional tasks.  Pt had most difficulty with initiating tasks and continuing/completing tasks on L side withhout cues.  Seated lateral yoga arching stretch bil 2x30sec,  reaching overhead.    Pt left up in [facility] with family.     Therapy Documentation Precautions:  Precautions Precautions: Fall Precaution Comments: left inattention Restrictions Weight Bearing Restrictions: No General:   Vital Signs:  Pain: Pain Assessment Pain Assessment: No/denies pain Mobility:     See Function Navigator for Current Functional Status.   Therapy/Group: Individual Therapy  Brighton Delio Soundra Pilon, PT, DPT  03/26/2016, 1:53 PM

## 2016-03-26 NOTE — Progress Notes (Addendum)
Speech Language Pathology Daily Session Note  Patient Details  Name: Cynthia [name] MRN: [id number] Date of Birth: Jan 30, 1946  Today's Date: 03/26/2016 SLP Individual Time: [id number] SLP Individual Time Calculation (min): 45 min  Short Term Goals: Week 1: SLP Short Term Goal 1 (Week 1): Pt will attend to the left of the environment during familiar, functional tasks with supervision verbal cues.  SLP Short Term Goal 2 (Week 1): Pt will recall daily information with supervision verbal cues for use of external aids.   SLP Short Term Goal 3 (Week 1): Pt will complete semi-complex tasks with supervision verbal cues for functional problem solving.   SLP Short Term Goal 4 (Week 1): Pt will utilize overarticulation to achieve articulatory precision at the conversational level with mod I.  SLP Short Term Goal 5 (Week 1): Pt will utilize compensatory strategies to facilitate semi-complex word finding at the conversational level wtih mod I.    Skilled Therapeutic Interventions: Skilled treatment session focused on cognition goals. SLP facilitated session by providing supervision cues for completing semi-complex tasks for functional problem solving (finding items on grocery ad on computer). Pt able to navigate computer ad with Min A verbal cues. Pt able to utilize compensatory strategies to attend to left of the environment during familiar functional tasks with supervision cues. Pt able to recall strategies for increased attention to left and able to demonstrate strategies with supervision cues. Pt with use of articulatory precision at the conversational level with Mod I.  Pt recalled daily information wit supervision verbal cues. Pt returned to room and assisted to be with safety measures in place and all needs within reach. Continue with current plan of care.   Function:    Cognition Comprehension Comprehension assist level: Follows basic conversation/direction with extra time/assistive device   Expression   Expression assist level: Expresses basic 75 - 89% of the time/requires cueing 10 - 24% of the time. Needs helper to occlude trach/needs to repeat words.  Social Interaction Social Interaction assist level: Interacts appropriately 75 - 89% of the time - Needs redirection for appropriate language or to initiate interaction.  Problem Solving Problem solving assist level: Solves basic 75 - 89% of the time/requires cueing 10 - 24% of the time  Memory Memory assist level: Recognizes or recalls 75 - 89% of the time/requires cueing 10 - 24% of the time    Pain    Therapy/Group: Individual Therapy  Mertie Haslem 03/26/2016, 11:51 AM

## 2016-03-27 NOTE — Progress Notes (Signed)
Patient ID: Cynthia [name], female   DOB: 12/12/1945, 70 y.o.   MRN: [id number]   Patient ID: Cynthia [name], female   DOB: 08/08/1946, 70 y.o.   MRN: [id number]  03/27/16.  70-year-old patient admitted for CIR with  Left hemiplegia secondary to right frontal ICH.  Past Medical History  Diagnosis Date  . Colon polyps   . Osteoporosis   . Depression     history of      Subjective/Complaints: No new issues overnite Feels well but seems a bit more depressed today  ROS  Denies CP, SOB, N/V/D  Objective: Vital Signs: Blood pressure 140/69, pulse 90, temperature 98.6 F (37 C), temperature source Oral, resp. rate 18, height 5' 6" (1.676 m), weight 157 lb 8 oz (71.442 kg), SpO2 97 %. No results found. Results for orders placed or performed during the hospital encounter of 03/24/16 (from the past 72 hour(s))  CBC WITH DIFFERENTIAL     Status: Abnormal   Collection Time: 03/25/16  5:28 AM  Result Value Ref Range   WBC 9.2 4.0 - 10.5 K/uL   RBC 3.96 3.87 - 5.11 MIL/uL   Hemoglobin 12.1 12.0 - 15.0 g/dL   HCT 34.4 (L) 36.0 - 46.0 %   MCV 86.9 78.0 - 100.0 fL   MCH 30.6 26.0 - 34.0 pg   MCHC 35.2 30.0 - 36.0 g/dL   RDW 12.8 11.5 - 15.5 %   Platelets 279 150 - 400 K/uL   Neutrophils Relative % 73 %   Neutro Abs 6.7 1.7 - 7.7 K/uL   Lymphocytes Relative 21 %   Lymphs Abs 1.9 0.7 - 4.0 K/uL   Monocytes Relative 6 %   Monocytes Absolute 0.6 0.1 - 1.0 K/uL   Eosinophils Relative 0 %   Eosinophils Absolute 0.0 0.0 - 0.7 K/uL   Basophils Relative 0 %   Basophils Absolute 0.0 0.0 - 0.1 K/uL  Comprehensive metabolic panel     Status: Abnormal   Collection Time: 03/25/16  5:28 AM  Result Value Ref Range   Sodium 139 135 - 145 mmol/L   Potassium 2.9 (L) 3.5 - 5.1 mmol/L   Chloride 99 (L) 101 - 111 mmol/L   CO2 29 22 - 32 mmol/L   Glucose, Bld 101 (H) 65 - 99 mg/dL   BUN <5 (L) 6 - 20 mg/dL   Creatinine, Ser 0.61 0.44 - 1.00 mg/dL   Calcium 9.1 8.9 - 10.3 mg/dL   Total  Protein 6.2 (L) 6.5 - 8.1 g/dL   Albumin 3.4 (L) 3.5 - 5.0 g/dL   AST 45 (H) 15 - 41 U/L   ALT 30 14 - 54 U/L   Alkaline Phosphatase 80 38 - 126 U/L   Total Bilirubin 0.7 0.3 - 1.2 mg/dL   GFR calc non Af Amer >60 >60 mL/min   GFR calc Af Amer >60 >60 mL/min    Comment: (NOTE) The eGFR has been calculated using the CKD EPI equation. This calculation has not been validated in all clinical situations. eGFR's persistently <60 mL/min signify possible Chronic Kidney Disease.    Anion gap 11 5 - 15  Basic metabolic panel     Status: Abnormal   Collection Time: 03/26/16  7:42 AM  Result Value Ref Range   Sodium 139 135 - 145 mmol/L   Potassium 3.4 (L) 3.5 - 5.1 mmol/L   Chloride 103 101 - 111 mmol/L   CO2 29 22 - 32 mmol/L   Glucose, Bld 99 65 -   99 mg/dL   BUN 9 6 - 20 mg/dL   Creatinine, Ser 0.64 0.44 - 1.00 mg/dL   Calcium 9.2 8.9 - 10.3 mg/dL   GFR calc non Af Amer >60 >60 mL/min   GFR calc Af Amer >60 >60 mL/min    Comment: (NOTE) The eGFR has been calculated using the CKD EPI equation. This calculation has not been validated in all clinical situations. eGFR's persistently <60 mL/min signify possible Chronic Kidney Disease.    Anion gap 7 5 - 15     BP Readings from Last 3 Encounters:  03/27/16 140/69  03/24/16 157/75  11/11/15 136/82    Constitutional: She is oriented to person, place, and time. She appears well-developed and well-nourished.  HENT:  Head: Normocephalic and atraumatic.  Eyes:  Pupils reactive to light Right gaze preference  Neck: Normal range of motion. Neck supple. No thyromegaly present.  Cardiovascular: Normal rate and regular rhythm.  Respiratory: Effort normal and breath sounds normal. No respiratory distress.  GI: Soft. Bowel sounds are normal. She exhibits no distension.  Musculoskeletal: She exhibits no edema or tenderness.  Neurological: She is alert and oriented to person, place, and time.   alert and appropriate  Extr- SCDs in  place  Right gaze preferenceSensation intact to light touch DTRs symmetric Motor: RUE: 5/5 proximal to distal RLE: 4+/5 proximal to distal LUE: 0/5 LLE: 2 minus/5 hip and knee extensor synergy, trace toe flexion absent Ankle dorsiflexion  Medical Problem List and Plan: 1.  Left hemiplegia secondary to right frontal ICH- CIR PT, OT, SLP 2.  DVT Prophylaxis/Anticoagulation: SCDs. Monitor for signs of DVT  3. Mood: Xanax 0.25 mg twice daily as needed for   4. Fluids/Electrolytes/Nutrition: Routine I&O's with follow-up chemistries, Hypo K will supplement.  Follow potassium 3.4 yesterday.  We'll continue oral supplement   LOS (Days) 3 A FACE TO FACE EVALUATION WAS PERFORMED  [name] 03/27/2016, 9:42 AM

## 2016-03-28 ENCOUNTER — Inpatient Hospital Stay (HOSPITAL_COMMUNITY): Payer: Medicare Other | Admitting: Physical Therapy

## 2016-03-28 ENCOUNTER — Inpatient Hospital Stay (HOSPITAL_COMMUNITY): Payer: Medicare Other | Admitting: Speech Pathology

## 2016-03-28 ENCOUNTER — Inpatient Hospital Stay (HOSPITAL_COMMUNITY): Payer: Medicare Other | Admitting: Occupational Therapy

## 2016-03-28 DIAGNOSIS — I619 Nontraumatic intracerebral hemorrhage, unspecified: Secondary | ICD-10-CM

## 2016-03-28 MED ORDER — POTASSIUM CHLORIDE CRYS ER 20 MEQ PO TBCR
30.0000 meq | EXTENDED_RELEASE_TABLET | Freq: Two times a day (BID) | ORAL | Status: AC
  Administered 2016-03-28 – 2016-03-30 (×6): 30 meq via ORAL
  Filled 2016-03-28 (×6): qty 1

## 2016-03-28 NOTE — Progress Notes (Signed)
Occupational Therapy Session Note  Patient Details  Name: Cynthia [name] MRN: [id number] Date of Birth: 30-Jun-1946  Today's Date: 03/28/2016 OT Individual Time: [id number] OT Individual Time Calculation (min): 32 min    Skilled Therapeutic Interventions/Progress Updates:    Pt worked on tying her right shoe to start session using [employer].  She needed increased time and two attempts to complete task.  Decreased ability to integrate the LUE into task.  Had her ambulate down to the [facility] gym for work on left visual attention and LUE functional use with the Dynavision.  Pt needed 3-6 seconds during intervals on the left of midline in standing with less than 2.5 seconds on all intervals on the right side.  Progressed to timed programs with emphasis on scanning left of midline using circular pattern.  Pt continued to miss items on the left 25% of the time during 5 second patterns.  Noted 50% missed on the left with less than 10% missed on the right when time was reduced to 3 second patterns.  Encouraged circular pattern for ambulation back in the hallway and in the environment.  Min guard assist for ambulation back to the room.  Pt noted with decreased spontaneous use of the LUE for sit to stand or managing clothing for toileting once back in the room.  Pt left with NT to complete toileting tasks.   Therapy Documentation Precautions:  Precautions Precautions: Fall Precaution Comments: left inattention Restrictions Weight Bearing Restrictions: No  Pain: Pain Assessment Pain Assessment: No/denies pain ADL: See Function Navigator for Current Functional Status.   Therapy/Group: Individual Therapy  Almedia Cordell  OTR/L 03/28/2016, 3:58 PM

## 2016-03-28 NOTE — Progress Notes (Signed)
Speech Language Pathology Daily Session Note  Patient Details  Name: Cynthia [name] MRN: [id number] Date of Birth: 1946-06-10  Today's Date: 03/28/2016 SLP Individual Time: 0900-1000 SLP Individual Time Calculation (min): 60 min  Short Term Goals: Week 1: SLP Short Term Goal 1 (Week 1): Pt will attend to the left of the environment during familiar, functional tasks with supervision verbal cues.  SLP Short Term Goal 2 (Week 1): Pt will recall daily information with supervision verbal cues for use of external aids.   SLP Short Term Goal 3 (Week 1): Pt will complete semi-complex tasks with supervision verbal cues for functional problem solving.   SLP Short Term Goal 4 (Week 1): Pt will utilize overarticulation to achieve articulatory precision at the conversational level with mod I.  SLP Short Term Goal 5 (Week 1): Pt will utilize compensatory strategies to facilitate semi-complex word finding at the conversational level wtih mod I.    Skilled Therapeutic Interventions:  Pt was seen for skilled ST targeting cognitive goals.  Pt required supervision verbal cues to attend to left upper extremity during sink level self care tasks.  Therapist facilitated the session with money management tasks targeting functional problem solving.  Pt required min assist verbal cues for working memory and organization during the abovementioned tasks.  Pt was able to complete functional math calculations from word problems with supervision verbal cues for 70% accuracy.  Pt with good insight into task difficulty and was able to identify working memory as primary limiting factor impacting abilty to complete task. Pt was left in wheelchair and handed off to PT at the end of today's therapy session.  Continue per current plan of care.    Function:  Eating Eating                 Cognition Comprehension Comprehension assist level: Follows basic conversation/direction with extra time/assistive device  Expression    Expression assist level: Expresses basic needs/ideas: With extra time/assistive device  Social Interaction Social Interaction assist level: Interacts appropriately 75 - 89% of the time - Needs redirection for appropriate language or to initiate interaction.  Problem Solving Problem solving assist level: Solves basic 75 - 89% of the time/requires cueing 10 - 24% of the time  Memory Memory assist level: Recognizes or recalls 75 - 89% of the time/requires cueing 10 - 24% of the time    Pain Pain Assessment Pain Assessment: No/denies pain  Therapy/Group: Individual Therapy  Lillard Bailon, Selinda Orion 03/28/2016, 12:15 PM

## 2016-03-28 NOTE — Progress Notes (Signed)
Subjective/Complaints: Patient without new complaints today. She continues to have left-sided weakness but is trying to work on releasing objects with her left hand.  Review of systems negative for chest pain, shortness of breath, nausea, vomiting, diarrhea or constipation. She has some urinary frequency, no burning with urination  Objective: Vital Signs: Blood pressure 142/73, pulse 85, temperature 98.8 F (37.1 C), temperature source Oral, resp. rate 18, height 5' 6"  (1.676 m), weight 66.679 kg (147 lb), SpO2 98 %. No results found. Results for orders placed or performed during the hospital encounter of 03/24/16 (from the past 72 hour(s))  Basic metabolic panel     Status: Abnormal   Collection Time: 03/26/16  7:42 AM  Result Value Ref Range   Sodium 139 135 - 145 mmol/L   Potassium 3.4 (L) 3.5 - 5.1 mmol/L   Chloride 103 101 - 111 mmol/L   CO2 29 22 - 32 mmol/L   Glucose, Bld 99 65 - 99 mg/dL   BUN 9 6 - 20 mg/dL   Creatinine, Ser 0.64 0.44 - 1.00 mg/dL   Calcium 9.2 8.9 - 10.3 mg/dL   GFR calc non Af Amer >60 >60 mL/min   GFR calc Af Amer >60 >60 mL/min    Comment: (NOTE) The eGFR has been calculated using the CKD EPI equation. This calculation has not been validated in all clinical situations. eGFR's persistently <60 mL/min signify possible Chronic Kidney Disease.    Anion gap 7 5 - 15     HEENT: normal Cardio: RRR and No murmurs Resp: CTA B/L and Unlabored GI: BS positive and Nontender, nondistended Extremity:  Pulses positive and No Edema Skin:   Intact Neuro: Flat, Abnormal Sensory Reduced left upper extremity. Light touch, Abnormal Motor 3/5 in the left deltoid, biceps, triceps, grip, hip flexor, knee extensor, ankle dorsiflexor, plantar flexor, Abnormal FMC Ataxic/ dec FMC and Inattention Musc/Skel:  Other No pain with upper limb or lower limb. Range of motion Gen. no acute distress   Assessment/Plan: 1. Functional deficits secondary to right frontal lobe  intracranial hemorrhage which require 3+ hours per day of interdisciplinary therapy in a comprehensive inpatient rehab setting. Physiatrist is providing close team supervision and 24 hour management of active medical problems listed below. Physiatrist and rehab team continue to assess barriers to discharge/monitor patient progress toward functional and medical goals. FIM: Function - Bathing Position: Shower Body parts bathed by patient: Right arm, Left arm, Chest, Abdomen, Front perineal area, Buttocks, Right upper leg, Left upper leg, Left lower leg, Right lower leg Body parts bathed by helper: Back Assist Level: Touching or steadying assistance(Pt > 75%)  Function- Upper Body Dressing/Undressing What is the patient wearing?: Pull over shirt/dress Pull over shirt/dress - Perfomed by patient: Thread/unthread right sleeve, Pull shirt over trunk, Thread/unthread left sleeve, Put head through opening Pull over shirt/dress - Perfomed by helper: Thread/unthread left sleeve, Put head through opening Assist Level: Supervision or verbal cues Function - Lower Body Dressing/Undressing What is the patient wearing?: Underwear, Pants, Non-skid slipper socks Position: Wheelchair/chair at sink Underwear - Performed by patient: Thread/unthread right underwear leg, Pull underwear up/down Underwear - Performed by helper: Thread/unthread left underwear leg Pants- Performed by patient: Thread/unthread right pants leg, Pull pants up/down Pants- Performed by helper: Thread/unthread left pants leg Non-skid slipper socks- Performed by patient: Don/doff right sock, Don/doff left sock Non-skid slipper socks- Performed by helper: Don/doff right sock, Don/doff left sock Assist for footwear: Supervision/touching assist Assist for lower body dressing: Touching or steadying assistance (  Pt > 75%)  Function - Toileting Toileting steps completed by patient: Performs perineal hygiene Toileting steps completed by helper:  Adjust clothing prior to toileting, Adjust clothing after toileting Toileting Assistive Devices: Grab bar or rail  Function - Air cabin crew transfer assistive device: Grab bar Assist level to toilet: Touching or steadying assistance (Pt > 75%) Assist level from toilet: Touching or steadying assistance (Pt > 75%)  Function - Chair/bed transfer Chair/bed transfer method: Stand pivot Chair/bed transfer assist level: Touching or steadying assistance (Pt > 75%) Chair/bed transfer assistive device: Armrests Chair/bed transfer details: Verbal cues for precautions/safety, Verbal cues for technique  Function - Locomotion: Wheelchair Will patient use wheelchair at discharge?: No Function - Locomotion: Ambulation Assistive device: Hand held assist Max distance: 150 Assist level: Touching or steadying assistance (Pt > 75%) Assist level: Touching or steadying assistance (Pt > 75%) Assist level: Touching or steadying assistance (Pt > 75%) Walk 150 feet activity did not occur: Safety/medical concerns Assist level: Touching or steadying assistance (Pt > 75%) Walk 10 feet on uneven surfaces activity did not occur: Safety/medical concerns  Function - Comprehension Comprehension: Auditory Comprehension assist level: Follows basic conversation/direction with extra time/assistive device  Function - Expression Expression: Verbal Expression assist level: Expresses basic 75 - 89% of the time/requires cueing 10 - 24% of the time. Needs helper to occlude trach/needs to repeat words.  Function - Social Interaction Social Interaction assist level: Interacts appropriately 75 - 89% of the time - Needs redirection for appropriate language or to initiate interaction.  Function - Problem Solving Problem solving assist level: Solves basic 75 - 89% of the time/requires cueing 10 - 24% of the time  Function - Memory Memory assist level: Recognizes or recalls 75 - 89% of the time/requires cueing 10 -  24% of the time Patient normally able to recall (first 3 days only): Current season, Location of own room, That he or she is in a hospital   Medical Problem List and Plan: 1. Left hemiplegia secondary to right frontal ICH, CIR PT, OT, speech 2. DVT Prophylaxis/Anticoagulation: SCDs. Monitor for signs of DVT 3. Pain Management: Tylenol as needed 4. Mood: Xanax 0.25 mg twice daily as needed for 5. Neuropsych: This patient is capable of making decisions on her own behalf. 6. Skin/Wound Care: Routine skin checks 7. Fluids/Electrolytes/Nutrition: Routine I&O's, persistent hypokalemia, will increase. KCl 30 mEq twice a day recheck 3 days  LOS (Days) 4 A FACE TO FACE EVALUATION WAS PERFORMED  Cynthia [name] E 03/28/2016, 10:02 AM

## 2016-03-28 NOTE — Progress Notes (Signed)
Occupational Therapy Session Note  Patient Details  Name: Cynthia [name] MRN: [id number] Date of Birth: [birth year]  Today's Date: 03/28/2016 OT Individual Time: 1100-1159 OT Individual Time Calculation (min): 59 min    Short Term Goals: Week 1:  OT Short Term Goal 1 (Week 1): Pt will demonstrate improved L visual awareness by donning L arm in shirt first without cues. OT Short Term Goal 2 (Week 1): Pt will be able to pull underwear over hips with touching A. OT Short Term Goal 3 (Week 1): Pt will bathe self with steadying A. OT Short Term Goal 4 (Week 1): Pt will demonstrate improved dynamic sitting balance during bathing.  Skilled Therapeutic Interventions/Progress Updates:    Skilled OT intervention with focus on forced use of L UE, scanning to L , safety, functional transfers/mobility, and self care retraining. Pt ambulated with min HHA into bathroom for bathing at shower level. Pt given min cues throughout session to attend to L side as well as to wash with L UE when appropriate. Pt with close supervision for balance while seated on TTB. Pt standing with min A and steady assist for balance as she washed buttocks and peri area. Pt returning to sit on EOB for dressing tasks with min cues for hemiplegic dressing technique to encouraged dressing of L side first. Pt able to cross B ankles into lap in order to don socks and shoes. However, secondary to decreased coordination pt needing assist to tie shoe laces. Pt transferred into wheelchair with min A stand pivot and quick release belt donned. Pt seated with meal in front of her and call bell within reach.   Therapy Documentation Precautions:  Precautions Precautions: Fall Precaution Comments: left inattention Restrictions Weight Bearing Restrictions: No   Pain: Pain Assessment Pain Assessment: No/denies pain ADL: ADL ADL Comments: refer to functional navigator  See Function Navigator for Current Functional  Status.   Therapy/Group: Individual Therapy  Phineas Semen 03/28/2016, 12:35 PM

## 2016-03-28 NOTE — Progress Notes (Signed)
Physical Therapy Session Note  Patient Details  Name: Cynthia [name] MRN: [id number] Date of Birth: 07-06-1946  Today's Date: 03/28/2016 PT Individual Time: 1000-1045 PT Individual Time Calculation (min): 45 min   Short Term Goals: Week 1:  PT Short Term Goal 1 (Week 1): pt will perform gait 50' in controlled environment with supervision PT Short Term Goal 2 (Week 1): pt will maintain dynamic sitting balance for functional task with supervision PT Short Term Goal 3 (Week 1): pt will demo anticipatory awareness with min cuing   Therapy Documentation Precautions:  Precautions Precautions: Fall Precaution Comments: left inattention Restrictions Weight Bearing Restrictions: No  Pain: Pain Assessment Pain Assessment: No/denies pain   Transfers: Sit to and from stand transfer with close supervision; verbal cues for hand placement and controlled descent.   Ambulation: Patient ambulated 155 feet and 175 feet, without assistive device close supervision with episodes of min assist. Patient ambulated with a step through gait pattern. Verbal cues for increased step length and to increase cadence. Patient demonstrates uneven step and stride length with a varying cadence. Verbal cues for upright posture and pacing, attention to the left, and maintain a straight trajectory. Patient demonstrates increased postural sway to the left.   Stair negotiation: Patient negotiated 12 steps with both handrails and min assist. Patient performed stairs with a step to pattern. Patient educated on proper sequence and technique and was able to return demonstration with moderate verbal cues.  Nu-step 12 minutes level 4  Patient performed 5 cone retrieval with with emphasis placed on environmental scanning and awareness. Patient completed task with moderate verbal cues.   Patient returned to room at end of session with all needs met and husband present. Husband observed session. Patient and family educated  on current functional status, plan of care and goals.    See Function Navigator for Current Functional Status.   Therapy/Group: Individual Therapy  Retta Diones 03/28/2016, 12:21 PM

## 2016-03-29 ENCOUNTER — Inpatient Hospital Stay (HOSPITAL_COMMUNITY): Payer: Medicare Other | Admitting: Speech Pathology

## 2016-03-29 ENCOUNTER — Inpatient Hospital Stay (HOSPITAL_COMMUNITY): Payer: Medicare Other | Admitting: Occupational Therapy

## 2016-03-29 ENCOUNTER — Inpatient Hospital Stay (HOSPITAL_COMMUNITY): Payer: Medicare Other | Admitting: Physical Therapy

## 2016-03-29 DIAGNOSIS — R414 Neurologic neglect syndrome: Secondary | ICD-10-CM

## 2016-03-29 DIAGNOSIS — G8194 Hemiplegia, unspecified affecting left nondominant side: Secondary | ICD-10-CM | POA: Diagnosis present

## 2016-03-29 MED ORDER — LORATADINE 10 MG PO TABS
10.0000 mg | ORAL_TABLET | Freq: Every day | ORAL | Status: DC
  Administered 2016-03-29 – 2016-03-30 (×2): 10 mg via ORAL
  Filled 2016-03-29 (×2): qty 1

## 2016-03-29 NOTE — Progress Notes (Signed)
Speech Language Pathology Daily Session Note  Patient Details  Name: Cynthia [name] MRN: [id number] Date of Birth: 1946/03/09  Today's Date: 03/29/2016 SLP Individual Time: 0903-1000 SLP Individual Time Calculation (min): 57 min  Short Term Goals: Week 1: SLP Short Term Goal 1 (Week 1): Pt will attend to the left of the environment during familiar, functional tasks with supervision verbal cues.  SLP Short Term Goal 2 (Week 1): Pt will recall daily information with supervision verbal cues for use of external aids.   SLP Short Term Goal 3 (Week 1): Pt will complete semi-complex tasks with supervision verbal cues for functional problem solving.   SLP Short Term Goal 4 (Week 1): Pt will utilize overarticulation to achieve articulatory precision at the conversational level with mod I.  SLP Short Term Goal 5 (Week 1): Pt will utilize compensatory strategies to facilitate semi-complex word finding at the conversational level wtih mod I.    Skilled Therapeutic Interventions:  Pt was seen for skilled ST targeting cognitive goals.  Therapist facilitated the session with medication management subtest of the ALFA standardized cognitive assessment for diagnostic treatment of functional problem solving.  Pt was able to organize medications of varying frequencies into a pill chart with overall supervision level assist to monitor and correct errors.  Min assist verbal cues were needed on functional reading comprehension subtest due to decreased scanning to the left of midline and decreased working memory.  SLP also introduced a novel card game targeting visual scanning to the left of midline with forced use of left upper extremity to facilitate attention to left arm.  Pt was able to complete task with min assist faded to supervision verbal cues.  Pt was returned to room and left in chair with quick release belt donned and call bell within reach for safety.  Continue per current plan of care.     Function:  Eating Eating                 Cognition Comprehension Comprehension assist level: Follows basic conversation/direction with extra time/assistive device  Expression   Expression assist level: Expresses basic needs/ideas: With extra time/assistive device  Social Interaction Social Interaction assist level: Interacts appropriately 75 - 89% of the time - Needs redirection for appropriate language or to initiate interaction.  Problem Solving Problem solving assist level: Solves basic 75 - 89% of the time/requires cueing 10 - 24% of the time  Memory Memory assist level: Recognizes or recalls 75 - 89% of the time/requires cueing 10 - 24% of the time    Pain Pain Assessment Pain Assessment: No/denies pain   Therapy/Group: Individual Therapy  Hilbert Briggs, Selinda Orion 03/29/2016, 12:28 PM

## 2016-03-29 NOTE — Progress Notes (Signed)
Subjective/Complaints: Patient seen in therapy gym. Performing quadruped exercises. Has difficulty with weightbearing in the left arm. Discussed with physical therapy. Main deficit is her left neglect..  Review of systems negative for chest pain, shortness of breath, nausea, vomiting, diarrhea or constipation. She has some urinary frequency, no burning with urination  Objective: Vital Signs: Blood pressure 148/73, pulse 97, temperature 97.8 F (36.6 C), temperature source Oral, resp. rate 18, height 5\' 6"  (1.676 m), weight 65.862 kg (145 lb 3.2 oz), SpO2 93 %. No results found. No results found for this or any previous visit (from the past 72 hour(s)).   HEENT: normal Cardio: RRR and No murmurs Resp: CTA B/L and Unlabored GI: BS positive and Nontender, nondistended Extremity:  Pulses positive and No Edema Skin:   Intact Neuro: Flat, Abnormal Sensory Reduced left upper extremity. Light touch, Abnormal Motor 3/5 in the left deltoid, biceps, triceps, grip, hip flexor, knee extensor, ankle dorsiflexor, plantar flexor, Abnormal FMC Ataxic/ dec FMC and Inattention Musc/Skel:  Other No pain with upper limb or lower limb. visual fields are intact confrontation testing, but exhibits left neglect. No tactile neglect. Gen. no acute distress   Assessment/Plan: 1. Functional deficits secondary to right frontal lobe intracranial hemorrhage which require 3+ hours per day of interdisciplinary therapy in a comprehensive inpatient rehab setting. Physiatrist is providing close team supervision and 24 hour management of active medical problems listed below. Physiatrist and rehab team continue to assess barriers to discharge/monitor patient progress toward functional and medical goals. FIM: Function - Bathing Position: Shower Body parts bathed by patient: Right arm, Left arm, Chest, Abdomen, Front perineal area, Buttocks, Right upper leg, Left upper leg, Left lower leg, Right lower leg Body parts bathed  by helper: Back Assist Level: Touching or steadying assistance(Pt > 75%)  Function- Upper Body Dressing/Undressing What is the patient wearing?: Pull over shirt/dress, Bra Bra - Perfomed by patient: Thread/unthread right bra strap, Thread/unthread left bra strap Bra - Perfomed by helper: Hook/unhook bra (pull down sports bra) Pull over shirt/dress - Perfomed by patient: Thread/unthread right sleeve, Pull shirt over trunk, Thread/unthread left sleeve, Put head through opening Pull over shirt/dress - Perfomed by helper: Thread/unthread left sleeve, Put head through opening Assist Level: Touching or steadying assistance(Pt > 75%) Function - Lower Body Dressing/Undressing What is the patient wearing?: Underwear, Pants, Socks, Shoes Position: Sitting EOB Underwear - Performed by patient: Thread/unthread right underwear leg, Thread/unthread left underwear leg Underwear - Performed by helper: Pull underwear up/down Pants- Performed by patient: Thread/unthread right pants leg Pants- Performed by helper: Thread/unthread left pants leg, Pull pants up/down Non-skid slipper socks- Performed by patient: Don/doff right sock, Don/doff left sock Non-skid slipper socks- Performed by helper: Don/doff right sock, Don/doff left sock Socks - Performed by patient: Don/doff right sock, Don/doff left sock Shoes - Performed by patient: Don/doff right shoe Shoes - Performed by helper: Fasten right, Fasten left Assist for footwear: Partial/moderate assist Assist for lower body dressing:  (mod A)  Function - Toileting Toileting steps completed by patient: Performs perineal hygiene Toileting steps completed by helper: Adjust clothing prior to toileting, Adjust clothing after toileting Toileting Assistive Devices: Grab bar or rail  Function - Air cabin crew transfer assistive device: Grab bar Assist level to toilet: Touching or steadying assistance (Pt > 75%) Assist level from toilet: Touching or  steadying assistance (Pt > 75%)  Function - Chair/bed transfer Chair/bed transfer method: Stand pivot Chair/bed transfer assist level: Touching or steadying assistance (Pt > 75%) Chair/bed transfer  assistive device: Armrests Chair/bed transfer details: Verbal cues for precautions/safety, Verbal cues for technique  Function - Locomotion: Wheelchair Will patient use wheelchair at discharge?: No Function - Locomotion: Ambulation Assistive device: Hand held assist Max distance: 75 Assist level: Touching or steadying assistance (Pt > 75%) Assist level: Touching or steadying assistance (Pt > 75%) Assist level: Touching or steadying assistance (Pt > 75%) Walk 150 feet activity did not occur: Safety/medical concerns Assist level: Touching or steadying assistance (Pt > 75%) Walk 10 feet on uneven surfaces activity did not occur: Safety/medical concerns  Function - Comprehension Comprehension: Auditory Comprehension assist level: Follows basic conversation/direction with extra time/assistive device  Function - Expression Expression: Verbal Expression assist level: Expresses basic needs/ideas: With extra time/assistive device  Function - Social Interaction Social Interaction assist level: Interacts appropriately 75 - 89% of the time - Needs redirection for appropriate language or to initiate interaction.  Function - Problem Solving Problem solving assist level: Solves basic 75 - 89% of the time/requires cueing 10 - 24% of the time  Function - Memory Memory assist level: Recognizes or recalls 75 - 89% of the time/requires cueing 10 - 24% of the time Patient normally able to recall (first 3 days only): Current season, Location of own room, That he or she is in a hospital   Medical Problem List and Plan: 1. Left hemiplegia secondary to right frontal ICH, CIR PT, OT, speech, team conference in a.m. 2. DVT Prophylaxis/Anticoagulation: SCDs. Monitor for signs of DVT 3. Pain Management:  Tylenol as needed, no current pain complaints 4. Mood: Xanax 0.25 mg twice daily as needed for 5. Neuropsych: This patient is capable of making decisions on her own behalf. 6. Skin/Wound Care: Routine skin checks 7. Fluids/Electrolytes/Nutrition: Routine I&O's, persistent hypokalemia, will increase. KCl 30 mEq twice a day recheck  in a.m.  LOS (Days) 5 A FACE TO FACE EVALUATION WAS PERFORMED  Cynthia [name] E 03/29/2016, 9:04 AM

## 2016-03-29 NOTE — Progress Notes (Signed)
Occupational Therapy Session Note  Patient Details  Name: Cynthia [name] MRN: [id number] Date of Birth: 1946-05-07  Today's Date: 03/29/2016 OT Individual Time: 1300-1415 OT Individual Time Calculation (min): 75 min    Short Term Goals: Week 1:  OT Short Term Goal 1 (Week 1): Pt will demonstrate improved L visual awareness by donning L arm in shirt first without cues. OT Short Term Goal 2 (Week 1): Pt will be able to pull underwear over hips with touching A. OT Short Term Goal 3 (Week 1): Pt will bathe self with steadying A. OT Short Term Goal 4 (Week 1): Pt will demonstrate improved dynamic sitting balance during bathing.  Skilled Therapeutic Interventions/Progress Updates:    Pt ambulated to the therapy gym with supervision during session.  She continued to miss turns to the left side when attempting to navigate to the gym with max instructional cueing to stop and take the appropriate turns.  Worked in quadriped on LUE weightbearing on the therapy mat.  Incorporated reaching with the RUE with clothespins while supporting weight with the LUE.  Transitioned to tall kneeling from quadriped for resting.  Transitioned to sitting with pt working on picking up clothes pins with the LUE with increased time.  Pt having to scan to the left of midline to place them in container with increased time.  At times pt unable to let go of clothespins so needed visual attention to be able to let go of the clothespins.  Also had pt complete 9 hole peg test.  She was able to perform in 35 seconds on the left and 1 min 18 seconds on the right.  Finished session having pt ambulate in the hallway to gather post it notes placed on both the left and right of the hallway.  She was able to locate 6/6 with increased time.  While ambulating back to the room she needed max demonstrational cueing to locate room on the left side of the hallway.  Pt left in wheelchair with call button and phone in reach.    Therapy  Documentation Precautions:  Precautions Precautions: Fall Precaution Comments: left inattention Restrictions Weight Bearing Restrictions: No  Pain: Pain Assessment Pain Assessment: No/denies pain ADL: See Function Navigator for Current Functional Status.   Therapy/Group: Individual Therapy  Alajiah Dutkiewicz OTR/L 03/29/2016, 3:48 PM

## 2016-03-29 NOTE — Progress Notes (Signed)
Physical Therapy Note  Patient Details  Name: Cynthia [name] MRN: [id number] Date of Birth: 1946-06-07 Today's Date: 03/29/2016    Time: [id number] 55 minutes  1:1 No c/o pain. Pt eating breakfast on PT arrival.  Pt finished breakfast with mod/max cuing to attend to left UE and left side of plate.  Seated and standing balance for dressing with close supervision.  Pt needs increased time and mod multimodal cues to problem solve orientation of clothing and attend to Lt side during dressing tasks. Gait with close supervision in controlled environment.  Quadruped training with focus on forced use of Lt UE and LE, pt able to perform LE and UE raises with min assist for hip and trunk control.  Attempt tall kneeling, pt requires min A to maintain upright posture due to glute and hamstring weakness.  Stair negotiation with min A, cues for spatial awareness and Lt attention.  Pt continues to be limited by decreased attention and awareness to Lt side.   Khristian Seals 03/29/2016, 8:57 AM

## 2016-03-30 ENCOUNTER — Inpatient Hospital Stay (HOSPITAL_COMMUNITY): Payer: Medicare Other | Admitting: Speech Pathology

## 2016-03-30 ENCOUNTER — Inpatient Hospital Stay (HOSPITAL_COMMUNITY): Payer: Medicare Other | Admitting: Occupational Therapy

## 2016-03-30 ENCOUNTER — Inpatient Hospital Stay (HOSPITAL_COMMUNITY): Payer: Medicare Other

## 2016-03-30 LAB — BASIC METABOLIC PANEL
ANION GAP: 6 (ref 5–15)
BUN: 12 mg/dL (ref 6–20)
CALCIUM: 9.1 mg/dL (ref 8.9–10.3)
CHLORIDE: 100 mmol/L — AB (ref 101–111)
CO2: 27 mmol/L (ref 22–32)
Creatinine, Ser: 0.68 mg/dL (ref 0.44–1.00)
GFR calc Af Amer: >60 mL/min (ref >60)
Glucose, Bld: 141 mg/dL — ABNORMAL HIGH (ref 65–99)
Potassium: 4.2 mmol/L (ref 3.5–5.1)
Sodium: 133 mmol/L — ABNORMAL LOW (ref 135–145)

## 2016-03-30 MED ORDER — FEXOFENADINE HCL 180 MG PO TABS
180.0000 mg | ORAL_TABLET | Freq: Every day | ORAL | Status: DC
  Administered 2016-03-31 – 2016-04-07 (×8): 180 mg via ORAL
  Filled 2016-03-30 (×9): qty 1

## 2016-03-30 MED ORDER — NON FORMULARY: 180.0000 mg | Freq: Every day | Status: DC

## 2016-03-30 NOTE — Progress Notes (Signed)
Occupational Therapy Session Note  Patient Details  Name: Cynthia [name] MRN: [id number] Date of Birth: 02-03-1946  Today's Date: 03/30/2016 OT Individual Time: 0830-0900 OT Individual Time Calculation (min): 30 min    Short Term Goals: Week 1:  OT Short Term Goal 1 (Week 1): Pt will demonstrate improved L visual awareness by donning L arm in shirt first without cues. OT Short Term Goal 2 (Week 1): Pt will be able to pull underwear over hips with touching A. OT Short Term Goal 3 (Week 1): Pt will bathe self with steadying A. OT Short Term Goal 4 (Week 1): Pt will demonstrate improved dynamic sitting balance during bathing.  Skilled Therapeutic Interventions/Progress Updates:    Pt seen for BADL of shower and dressing with a focus on motor planning, balance, LLE and LUE awareness, attention to task. Pt received in bed and agreeable to shower. Pt ambulated to dresser to locate clothing needed with max cues to scan to L to find dresser and clothing on L side of drawer. Ambulated to shower stall with guiding A.  In shower, pt needed cues to use L hand and fully attend to it due to perseveration and motor impersistence which varies depending on the task.  Pt was also demonstrating R hand apraxia with functional activities. Min A with bathing and dressing task to start L side and to fully pull pants over hips.  Pt sitting on bed in preparation to don socks/shoes, hand off to 2nd OT to start next session.   Therapy Documentation Precautions:  Precautions Precautions: Fall Precaution Comments: left inattention Restrictions Weight Bearing Restrictions: No      Pain: Pain Assessment Pain Assessment: No/denies pain ADL: ADL ADL Comments: refer to functional navigator  See Function Navigator for Current Functional Status.   Therapy/Group: Individual Therapy  SAGUIER,JULIA 03/30/2016, 9:43 AM

## 2016-03-30 NOTE — Progress Notes (Signed)
Occupational Therapy Session Note  Patient Details  Name: Cynthia [name] MRN: [id number] Date of Birth: 01-28-1946  Today's Date: 03/30/2016 OT Individual Time: [id number] Individual Time Calculation (min): 44 min    Short Term Goals: Week 1:  OT Short Term Goal 1 (Week 1): Pt will demonstrate improved L visual awareness by donning L arm in shirt first without cues. OT Short Term Goal 2 (Week 1): Pt will be able to pull underwear over hips with touching A. OT Short Term Goal 3 (Week 1): Pt will bathe self with steadying A. OT Short Term Goal 4 (Week 1): Pt will demonstrate improved dynamic sitting balance during bathing.  Skilled Therapeutic Interventions/Progress Updates:    Pt worked on finishing dressing sit to stand at the EOB.  Max instructional cueing for integration of the LUE for pulling up pants on the left side.  Max assist for sequencing and donning bra this session.  Pt still grasps items with the LUE but cannot motor plan letting it go efficiently.  She needed mod assist for tying shoes but could donn them with setup and min instructional cueing to scan left of midline to locate them.  She was able to [employer] with setup as well.  Pt left with NT in wheelchair to finish up with oral hygiene.    Therapy Documentation Precautions:  Precautions Precautions: Fall Precaution Comments: left inattention Restrictions Weight Bearing Restrictions: No  Pain: Pain Assessment Pain Assessment: No/denies pain Pain Score: 0-No pain ADL: See Function Navigator for Current Functional Status.   Therapy/Group: Individual Therapy  Lillieann Pavlich OTR/L 03/30/2016, 10:44 AM

## 2016-03-30 NOTE — Progress Notes (Signed)
Physical Therapy Note  Patient Details  Name: Cynthia [name] MRN: [id number] Date of Birth: August 13, 1946 Today's Date: 03/30/2016  1405-1505, 60 min individual tx Pain: none noted  Family ed with husband for multimodal cueing for L attention. Simulated car transfer with PT and husband, min guard, max multimodal cueing for L neglect; also gait on level tile holding her L hand to increase visual attention to L.  neuromuscular re-education via visual feedback, VCs for = long arc knee ext in sitting in w/c while PT pushed w/c; scanning L visually during gait without AD to find yellow discs at varying heights, requring max cues, gait forward/backward with L index finger pointing at photos on wall.Gait to return to room, dual task while carrying slide board with 5 cups on it, held by bil hands, without spillage, with max cues for attention to L for route finding and finding room.   Pt left resting in recliner wtih all needs within reach, and family present.  Amelya Mabry 03/30/2016, 2:34 PM

## 2016-03-30 NOTE — Progress Notes (Signed)
Subjective/Complaints: Patient without pain complaints today. She has a stuffy nose. Uses Zyrtec at home but is taking Claritin here. No fever or chills  Review of systems negative for chest pain, shortness of breath, nausea, vomiting, diarrhea or constipation. She has some urinary frequency, no burning with urination  Objective: Vital Signs: Blood pressure 164/93, pulse 92, temperature 97.7 F (36.5 C), temperature source Oral, resp. rate 17, height 5' 6"  (1.676 m), weight 66.407 kg (146 lb 6.4 oz), SpO2 97 %. No results found. No results found for this or any previous visit (from the past 72 hour(s)).   HEENT: normal, negative rhinorrhea Cardio: RRR and No murmurs Resp: CTA B/L and Unlabored GI: BS positive and Nontender, nondistended Extremity:  Pulses positive and No Edema Skin:   Intact Neuro: Flat, Abnormal Sensory Reduced left upper extremity. Light touch, Abnormal Motor 3/5 in the left deltoid, biceps, triceps, grip, hip flexor, knee extensor, ankle dorsiflexor, plantar flexor, Abnormal FMC Ataxic/ dec FMC and Inattention Musc/Skel:  Other No pain with upper limb or lower limb. visual fields are intact confrontation testing, but exhibits left neglect. No tactile neglect. Gen. no acute distress   Assessment/Plan: 1. Functional deficits secondary to right frontal lobe intracranial hemorrhage which require 3+ hours per day of interdisciplinary therapy in a comprehensive inpatient rehab setting. Physiatrist is providing close team supervision and 24 hour management of active medical problems listed below. Physiatrist and rehab team continue to assess barriers to discharge/monitor patient progress toward functional and medical goals. FIM: Function - Bathing Position: Shower Body parts bathed by patient: Right arm, Left arm, Chest, Abdomen, Front perineal area, Buttocks, Right upper leg, Left upper leg, Left lower leg, Right lower leg Body parts bathed by helper: Back Assist  Level: Touching or steadying assistance(Pt > 75%)  Function- Upper Body Dressing/Undressing What is the patient wearing?: Pull over shirt/dress Bra - Perfomed by patient: Thread/unthread right bra strap, Thread/unthread left bra strap Bra - Perfomed by helper: Hook/unhook bra (pull down sports bra) Pull over shirt/dress - Perfomed by patient: Thread/unthread right sleeve, Put head through opening, Pull shirt over trunk Pull over shirt/dress - Perfomed by helper: Thread/unthread left sleeve Assist Level: Touching or steadying assistance(Pt > 75%) Function - Lower Body Dressing/Undressing What is the patient wearing?: Underwear, Pants Position: Sitting EOB Underwear - Performed by patient: Thread/unthread right underwear leg, Thread/unthread left underwear leg Underwear - Performed by helper: Pull underwear up/down Pants- Performed by patient: Thread/unthread right pants leg, Thread/unthread left pants leg Pants- Performed by helper: Pull pants up/down Non-skid slipper socks- Performed by patient: Don/doff right sock, Don/doff left sock Non-skid slipper socks- Performed by helper: Don/doff right sock, Don/doff left sock Socks - Performed by patient: Don/doff right sock, Don/doff left sock Shoes - Performed by patient: Don/doff right shoe Shoes - Performed by helper: Fasten right, Fasten left Assist for footwear: Partial/moderate assist Assist for lower body dressing:  (mod A)  Function - Toileting Toileting steps completed by patient: Performs perineal hygiene Toileting steps completed by helper: Adjust clothing prior to toileting, Adjust clothing after toileting Toileting Assistive Devices: Grab bar or rail Assist level: Touching or steadying assistance (Pt.75%) (per Dorian Heckle, NT)  Function - Toilet Transfers Toilet transfer assistive device: Grab bar Assist level to toilet: Touching or steadying assistance (Pt > 75%) Assist level from toilet: Touching or steadying assistance (Pt  > 75%)  Function - Chair/bed transfer Chair/bed transfer method: Stand pivot Chair/bed transfer assist level: Touching or steadying assistance (Pt > 75%) Chair/bed transfer  assistive device: Armrests Chair/bed transfer details: Verbal cues for precautions/safety, Verbal cues for technique  Function - Locomotion: Wheelchair Will patient use wheelchair at discharge?: No Function - Locomotion: Ambulation Assistive device: Hand held assist Max distance: 75 Assist level: Touching or steadying assistance (Pt > 75%) Assist level: Touching or steadying assistance (Pt > 75%) Assist level: Touching or steadying assistance (Pt > 75%) Walk 150 feet activity did not occur: Safety/medical concerns Assist level: Touching or steadying assistance (Pt > 75%) Walk 10 feet on uneven surfaces activity did not occur: Safety/medical concerns  Function - Comprehension Comprehension: Auditory Comprehension assist level: Follows basic conversation/direction with extra time/assistive device  Function - Expression Expression: Verbal Expression assist level: Expresses basic needs/ideas: With extra time/assistive device  Function - Social Interaction Social Interaction assist level: Interacts appropriately 75 - 89% of the time - Needs redirection for appropriate language or to initiate interaction.  Function - Problem Solving Problem solving assist level: Solves basic 75 - 89% of the time/requires cueing 10 - 24% of the time  Function - Memory Memory assist level: Recognizes or recalls 75 - 89% of the time/requires cueing 10 - 24% of the time Patient normally able to recall (first 3 days only): Current season, Location of own room, That he or she is in a hospital   Medical Problem List and Plan: 1. Left hemiplegia secondary to right frontal ICH, CIR PT, OT, speech,Team conference today please see physician documentation under team conference tab, met with team face-to-face to discuss problems,progress,  and goals. Formulized individual treatment plan based on medical history, underlying problem and comorbidities. 2. DVT Prophylaxis/Anticoagulation: SCDs. Monitor for signs of DVT 3. Pain Management: Tylenol as needed, no current pain complaints 4. Mood: Xanax 0.25 mg twice daily as needed  5. Neuropsych: This patient is capable of making decisions on her own behalf. 6. Skin/Wound Care: Routine skin checks 7. Fluids/Electrolytes/Nutrition: Routine I&O's, persistent hypokalemia, will increase. KCl 30 mEq twice a day recheck  today 8. History of nasal allergies, will try Zyrtec in place of Claritin LOS (Days) 6 A FACE TO FACE EVALUATION WAS PERFORMED  Kaylib Furness E 03/30/2016, 9:50 AM

## 2016-03-30 NOTE — Progress Notes (Signed)
Speech Language Pathology Daily Session Note  Patient Details  Name: Cynthia [name] MRN: [id number] Date of Birth: 1945-12-02  Today's Date: 03/30/2016 SLP Individual Time: 1107-1207 SLP Individual Time Calculation (min): 60 min  Short Term Goals: Week 1: SLP Short Term Goal 1 (Week 1): Pt will attend to the left of the environment during familiar, functional tasks with supervision verbal cues.  SLP Short Term Goal 2 (Week 1): Pt will recall daily information with supervision verbal cues for use of external aids.   SLP Short Term Goal 3 (Week 1): Pt will complete semi-complex tasks with supervision verbal cues for functional problem solving.   SLP Short Term Goal 4 (Week 1): Pt will utilize overarticulation to achieve articulatory precision at the conversational level with mod I.  SLP Short Term Goal 5 (Week 1): Pt will utilize compensatory strategies to facilitate semi-complex word finding at the conversational level wtih mod I.    Skilled Therapeutic Interventions: Skilled treatment session focused on addressing cognition goals. SLP facilitated session by providing Min question cues to recall the novel card game from yesterday.  Patient then completed procedures for task, targeting visual scanning to the left of midline with forced use of left upper extremity to facilitate attention to left arm with Min verbal cues.  SLP introduced a new task rule to previously established procedures which resulted in a need to increase cues to Mod assist level faded back to Min assist for overall accuracy.  Continue with current plan of care.    Function:  Cognition Comprehension Comprehension assist level: Follows basic conversation/direction with extra time/assistive device  Expression   Expression assist level: Expresses basic needs/ideas: With extra time/assistive device  Social Interaction Social Interaction assist level: Interacts appropriately 75 - 89% of the time - Needs redirection for  appropriate language or to initiate interaction.  Problem Solving Problem solving assist level: Solves basic 75 - 89% of the time/requires cueing 10 - 24% of the time  Memory Memory assist level: Recognizes or recalls 75 - 89% of the time/requires cueing 10 - 24% of the time    Pain Pain Assessment Pain Assessment: No/denies pain  Therapy/Group: Individual Therapy  Carmelia Roller., CCC-SLP [id number]  [place] 03/30/2016, 4:55 PM

## 2016-03-31 ENCOUNTER — Inpatient Hospital Stay (HOSPITAL_COMMUNITY): Payer: Medicare Other | Admitting: Speech Pathology

## 2016-03-31 ENCOUNTER — Inpatient Hospital Stay (HOSPITAL_COMMUNITY): Payer: Medicare Other

## 2016-03-31 ENCOUNTER — Inpatient Hospital Stay (HOSPITAL_COMMUNITY): Payer: Medicare Other | Admitting: Occupational Therapy

## 2016-03-31 NOTE — Progress Notes (Signed)
Social Work Patient ID: Cynthia [name], female   DOB: September 04, 1946, 70 y.o.   MRN: [id number]   Met with pt, husband and siblings yesterday to review team conference.  All aware and agreeable with targeted d/c date of 7/27 with supervision goals.  No concerns at this point.  Continue to follow.  Joselyne Spake, LCSW

## 2016-03-31 NOTE — Patient Care Conference (Signed)
Inpatient RehabilitationTeam Conference and Plan of Care Update Date: 03/30/2016   Time: 10:30 AM    Patient Name: Cynthia [name]      Medical Record Number: [id number]  Date of Birth: [birth year] Sex: Female         Room/Bed: 4W04C/4W04C-01 Payor Info: Payor: [employer] MEDICARE / Plan: [facility] MEDICARE / Product Type: *No Product type* /    Admitting Diagnosis: Stroke  Admit Date/Time:  03/24/2016  6:20 PM Admission Comments: No comment available   Primary Diagnosis:  Left hemiparesis ([place]) Principal Problem: Left hemiparesis [facility])  Patient Active Problem List   Diagnosis Date Noted  . Left hemiparesis ([place]) 03/29/2016  . 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 Vascular headache   . Benign essential HTN   . Hyponatremia   . Acute blood loss anemia   . TBI (traumatic brain injury) ([place])   . Intracerebral hemorrhage 03/20/2016  . ICH (intracerebral hemorrhage) ([place]) 03/20/2016  . Colon polyps   . Osteoporosis   . HTN (hypertension) 01/08/2013    Expected Discharge Date: Expected Discharge Date: 04/07/16  Team Members Present: Physician leading conference: Dr. Alysia Penna Social Worker Present: Lennart Pall, LCSW Nurse Present: Heather Roberts, RN PT Present: Georjean Mode, PT OT Present: Clyda Greener, OT SLP Present: Windell Moulding, SLP PPS Coordinator present : Daiva Nakayama, RN, CRRN     Current Status/Progress Goal Weekly Team Focus  Medical   Left neglect , allergic rhinitis  maintain medical stability  work on L Neglect   Bowel/Bladder   Cont of bladder and bowel. LBM 03/28/16. Stress incont. at times  To be cont. bladder adn bowel  Monitor bladder and bowel function  Swallow/Nutrition/ Hydration             ADL's   supervision for bathing with min assist for dressing tasks.  Min guard assist for mobility without assistive device.  Decreased proprieception in the LUE with neglect as well as visual field deficit on the left side.    supervision level overall  selfcare re-training, transfer training, balance re-training, visual scanning to the right, pt/family education,    Mobility   close supervision, min A  supervision gait and transfers  Lt attention, problem solving, awareness   Communication   Mild dysarthria and word finding impairment, suspect word finding impairment is secondary to cognitive impairment   mod I   articulatory precision and word finding    Safety/Cognition/ Behavioral Observations  moderate cognitive impairment, min assist for basic to semi-complex tasks   supervision   left attention, initiation, problem solving, working memory    Pain   No c/o pain   <3  Assess and treat pain during q shift   Skin   Skin CDI  No skin breakdown/infection  Assess skin q shift    Rehab Goals Patient on target to meet rehab goals: Yes *See Care Plan and progress notes for long and short-term goals.  Barriers to Discharge: see above    Possible Resolutions to Barriers:  cont rehab    Discharge Planning/Teaching Needs:  home with husband who can provide assistance and family can cover 24/7 care  Teaching to be scheduled   Team Discussion:  No significant medical issues. Assist ranges supervision to mod assist.  Visual cueing for environment, motor planning and delays.  Goals set for supervision overall.  Higher level cognitive issues.  Revisions to Treatment Plan:  None   Continued Need for Acute Rehabilitation Level of Care: The patient requires daily medical management by a physician with specialized training in physical medicine and rehabilitation for the following  conditions: Daily direction of a multidisciplinary physical rehabilitation program to ensure safe treatment while eliciting the highest outcome that is of practical value to the patient.: Yes Daily medical management of patient stability for increased activity during participation in an intensive rehabilitation regime.: Yes Daily analysis of laboratory values and/or radiology reports with any subsequent need for medication adjustment of medical intervention for : Neurological problems  Cynthia [name] 03/31/2016, 6:10 PM

## 2016-03-31 NOTE — Progress Notes (Signed)
Speech Language Pathology Weekly Progress and Session Note  Patient Details  Name: Cynthia [name] MRN: [id number] Date of Birth: [birth year]  Beginning of progress report period: March 25, 2016  End of progress report period: April 01, 2016   Today's Date: 03/31/2016 SLP Individual Time: 0903-1000 SLP Individual Time Calculation (min): 57 min  Short Term Goals: Week 1: SLP Short Term Goal 1 (Week 1): Pt will attend to the left of the environment during familiar, functional tasks with supervision verbal cues.  SLP Short Term Goal 1 - Progress (Week 1): Progressing toward goal SLP Short Term Goal 2 (Week 1): Pt will recall daily information with supervision verbal cues for use of external aids.   SLP Short Term Goal 2 - Progress (Week 1): Met SLP Short Term Goal 3 (Week 1): Pt will complete semi-complex tasks with supervision verbal cues for functional problem solving.   SLP Short Term Goal 3 - Progress (Week 1): Revised due to lack of progress SLP Short Term Goal 4 (Week 1): Pt will utilize overarticulation to achieve articulatory precision at the conversational level with mod I.  SLP Short Term Goal 4 - Progress (Week 1): Met SLP Short Term Goal 5 (Week 1): Pt will utilize compensatory strategies to facilitate semi-complex word finding at the conversational level wtih mod I.   SLP Short Term Goal 5 - Progress (Week 1): Other (comment) (not addressed to allow focus on cognitive goals, discontinued as word finding impairment appears to have resolved without intervention )    New Short Term Goals: Week 2: SLP Short Term Goal 1 (Week 2): Pt will attend to the left of the environment during familiar, functional tasks with supervision verbal cues.  SLP Short Term Goal 2 (Week 2): Pt will recall semi-complex information with supervision verbal cues for use of external aids.   SLP Short Term Goal 3 (Week 2): Pt will complete basic ttasks with supervision verbal cues for functional problem solving.    SLP Short Term Goal 4 (Week 2): Pt will recognize and correct errors in the moment during familiar tasks with min assist verbal cues.    Weekly Progress Updates:  Pt made slow functional gains this reporting period and has met 2 out of 4 short term goals.  Pt requires min assist during familiar tasks due to left inattention, decreased initiation and slowed processing speed, decreased functional problem solving, decreased recall of information, and decreased emergent awareness of deficits.  Pt would continue to benefit from skilled ST while inpatient in order to maximize functional independence and reduce burden of care prior to discharge.  Pt and family education is ongoing.  Anticipate that pt will need 24/7 supervision at next level of care in addition to ST follow up at next level of care.     Intensity: Minumum of 1-2 x/day, 30 to 90 minutes Frequency: 3 to 5 out of 7 days Duration/Length of Stay: 10-14 days  Treatment/Interventions: Cognitive remediation/compensation;Cueing hierarchy;Functional tasks;Environmental controls;Internal/external aids;Patient/family education   Daily Session  Skilled Therapeutic Interventions: Pt was seen for skilled ST targeting cognitive goals.  Pt required supervision cues for initiation and sequencing during upper and lower body dressing as well as oral care.  Min assist verbal cues needed for awareness of obstacles on the left when ambulating to treatment room.  SLP facilitated the session with oral reading tasks targeting visual scanning to the left of midline.  Pt was 100% accurate for visual scanning when reading from a personally selected magazine with mod I.  Pt was also fully intelligible when reading and during conversations with mod I, no interventions needed.  Recommend d/c intelligibility goals.  Pt required min assist for route recall and way finding due to left inattention when ambulating back to room.  Pt left in bed with call bell within reach and  bed alarm set.  Goals updated on this date to reflect current progress and plan of care.     Function:   Eating Eating                 Cognition Comprehension Comprehension assist level: Follows basic conversation/direction with extra time/assistive device  Expression   Expression assist level: Expresses basic needs/ideas: With extra time/assistive device  Social Interaction Social Interaction assist level: Interacts appropriately 75 - 89% of the time - Needs redirection for appropriate language or to initiate interaction.  Problem Solving Problem solving assist level: Solves basic 75 - 89% of the time/requires cueing 10 - 24% of the time  Memory Memory assist level: Recognizes or recalls 75 - 89% of the time/requires cueing 10 - 24% of the time   General    Pain Pain Assessment Pain Assessment: No/denies pain  Therapy/Group: Individual Therapy  Khanh Cordner, Elmyra Ricks L 03/31/2016, 1:00 PM

## 2016-03-31 NOTE — Progress Notes (Signed)
Occupational Therapy Session Note  Patient Details  Name: Cynthia [name] MRN: [id number] Date of Birth: [birth year]  Today's Date: 03/31/2016 OT Individual Time: 1400-1530 OT Individual Time Calculation (min): 90 min    Short Term Goals: Week 1:  OT Short Term Goal 1 (Week 1): Pt will demonstrate improved L visual awareness by donning L arm in shirt first without cues. OT Short Term Goal 2 (Week 1): Pt will be able to pull underwear over hips with touching A. OT Short Term Goal 3 (Week 1): Pt will bathe self with steadying A. OT Short Term Goal 4 (Week 1): Pt will demonstrate improved dynamic sitting balance during bathing.  Skilled Therapeutic Interventions/Progress Updates:    Pt worked on visual scanning both table top and environment during session.  She demonstrated decreased ability to carry over visual compensation techniques of turning her head to the left for locating signs in the hallway, elevators, and items in the gift shop.  Max instructional cueing to occasional max demonstrational cueing to scan left of midline with mobility.  Pt demonstrates decreased ability to maintain selective attention to tasks in a mod distracting environment.  Had pt sign herself out, which she was able to do with min questioning cueing, however when returning to the floor, pt demonstrated moderate difficulty locating where she had signed out before and exhibited difficulty also stating the time when looking at the clock.  Pt transitioned to the therapy gym where she engaged in table top task of locating cards and picking them up with the LUE, incorporating visual scanning to the left.  Pt was able to complete this task without cueing.  When asked to shuffle cards pt continued to demonstrate decreased spontaneous use of the LUE, even though she could pick up cards with it earlier.  Moderate difficulty sorting cards with the LUE and at times was unable to let go of a card she was holding onto.  Slight  increased anxiety reported from patient during this task.  Finished session by having pt locate orange sticky notes in the hallway on both sides while ambulating.  She was only able to locate 2/9 on first attempt, even missing all on the right side of the hallway.  Pt needed max demonstrational cueing to stop and scan both left and right to locate them before attempting to ambulate.  Pt ambulated back to her room and was able to locate it on the left side of the hallway without walking past it this session.  She was accompanied on the session by her spouse who helped encourage scanning left of her environment.  Discussed having pt scan to the left in her room during conversation with visitors and family as well as having her incorporate the LUE as much as possible.   Therapy Documentation Precautions:  Precautions Precautions: Fall Precaution Comments: left inattention Restrictions Weight Bearing Restrictions: No  Pain: Pain Assessment Pain Assessment: No/denies pain ADL: ADL ADL Comments: refer to functional navigator  See Function Navigator for Current Functional Status.   Therapy/Group: Individual Therapy  Gwendalyn Mcgonagle OTR/L 03/31/2016, 3:45 PM

## 2016-03-31 NOTE — Progress Notes (Signed)
Physical Therapy Note  Patient Details  Name: Cynthia [name] MRN: [id number] Date of Birth: 03/25/1946 Today's Date: 03/31/2016  1100-1205, 65 min individual tx Pain: none reported  Pt finishing toileting in BR with NT. With min guard, pt pulled pants up.  Neuro re-ed throughout session for attention to L, L visual attention, symmetrical body in space around obstacles. Gait to/from room.  Pt pulled out and scooting up chair to dining table on carpet, and stepped to L to manipulate 6 more chairs to fit under several tables, max multimodal cues for attention to L.  Multimodal cues to use L hand at sink.  Gait on level tile without AD, holding L hand with therapist and carrying box of tissue in R hand.  Up/down 12 corner steps using L rail only, step to method, min assist for progressing L hand on rail, no LOB.  Advanced gait to L through agility ladder on floor, min assist> mod assist for balance, max cues. Gait on carpet around furniture with close supervision.  Seated activity using bil hands to remove Rxc bottle lids and place to L. 10 MWT = 11 seconds, 2.98'/sec. Pt left resting in w/c with all needs within reach; partial set-up for lunch.  Pt's husband not here; neighbors visiting.  NTs and SLP informed that pt has difficulty with utensils and attending to L side of tray.   Kaliopi Blyden 03/31/2016, 7:42 AM

## 2016-03-31 NOTE — Progress Notes (Signed)
Subjective/Complaints: No issues overnite  Review of systems negative for chest pain, shortness of breath, nausea, vomiting, diarrhea or constipation. She has some urinary frequency, no burning with urination  Objective: Vital Signs: Blood pressure 137/83, pulse 79, temperature 97.6 F (36.4 C), temperature source Oral, resp. rate 18, height 5' 6"  (1.676 m), weight 66.407 kg (146 lb 6.4 oz), SpO2 99 %. No results found. Results for orders placed or performed during the hospital encounter of 03/24/16 (from the past 72 hour(s))  Basic metabolic panel     Status: Abnormal   Collection Time: 03/30/16  1:19 PM  Result Value Ref Range   Sodium 133 (L) 135 - 145 mmol/L   Potassium 4.2 3.5 - 5.1 mmol/L   Chloride 100 (L) 101 - 111 mmol/L   CO2 27 22 - 32 mmol/L   Glucose, Bld 141 (H) 65 - 99 mg/dL   BUN 12 6 - 20 mg/dL   Creatinine, Ser 0.68 0.44 - 1.00 mg/dL   Calcium 9.1 8.9 - 10.3 mg/dL   GFR calc non Af Amer >60 >60 mL/min   GFR calc Af Amer >60 >60 mL/min    Comment: (NOTE) The eGFR has been calculated using the CKD EPI equation. This calculation has not been validated in all clinical situations. eGFR's persistently <60 mL/min signify possible Chronic Kidney Disease.    Anion gap 6 5 - 15     HEENT: normal, negative rhinorrhea Cardio: RRR and No murmurs Resp: CTA B/L and Unlabored GI: BS positive and Nontender, nondistended Extremity:  Pulses positive and No Edema Skin:   Intact Neuro: Flat, Abnormal Sensory Reduced left upper extremity. Light touch, Abnormal Motor 3/5 in the left deltoid, biceps, triceps, grip, hip flexor, knee extensor, ankle dorsiflexor, plantar flexor, Abnormal FMC Ataxic/ dec FMC and Inattention Musc/Skel:  Other No pain with upper limb or lower limb. visual fields are intact confrontation testing, but exhibits left neglect. No tactile neglect. Gen. no acute distress   Assessment/Plan: 1. Functional deficits secondary to right frontal lobe  intracranial hemorrhage which require 3+ hours per day of interdisciplinary therapy in a comprehensive inpatient rehab setting. Physiatrist is providing close team supervision and 24 hour management of active medical problems listed below. Physiatrist and rehab team continue to assess barriers to discharge/monitor patient progress toward functional and medical goals. FIM: Function - Bathing Position: Shower Body parts bathed by patient: Right arm, Left arm, Chest, Abdomen, Front perineal area, Buttocks, Right upper leg, Left upper leg, Left lower leg, Right lower leg Body parts bathed by helper: Back Assist Level: Touching or steadying assistance(Pt > 75%)  Function- Upper Body Dressing/Undressing What is the patient wearing?: Pull over shirt/dress Bra - Perfomed by patient: Thread/unthread right bra strap, Thread/unthread left bra strap Bra - Perfomed by helper: Hook/unhook bra (pull down sports bra) Pull over shirt/dress - Perfomed by patient: Thread/unthread right sleeve, Put head through opening, Pull shirt over trunk Pull over shirt/dress - Perfomed by helper: Thread/unthread left sleeve Assist Level: Touching or steadying assistance(Pt > 75%) Function - Lower Body Dressing/Undressing What is the patient wearing?: Underwear, Pants Position: Sitting EOB Underwear - Performed by patient: Thread/unthread right underwear leg, Thread/unthread left underwear leg Underwear - Performed by helper: Pull underwear up/down Pants- Performed by patient: Thread/unthread right pants leg, Thread/unthread left pants leg, Pull pants up/down Pants- Performed by helper: Pull pants up/down Non-skid slipper socks- Performed by patient: Don/doff right sock, Don/doff left sock Non-skid slipper socks- Performed by helper: Don/doff right sock, Don/doff left sock  Socks - Performed by patient: Don/doff right sock, Don/doff left sock Shoes - Performed by patient: Don/doff right shoe, Don/doff left shoe Shoes -  Performed by helper: Fasten right, Fasten left Assist for footwear: Partial/moderate assist Assist for lower body dressing:  (mod A)  Function - Toileting Toileting steps completed by patient: Performs perineal hygiene Toileting steps completed by helper: Adjust clothing prior to toileting, Adjust clothing after toileting Toileting Assistive Devices: Grab bar or rail Assist level: Touching or steadying assistance (Pt.75%) (per Dorian Heckle, NT)  Function - Toilet Transfers Toilet transfer assistive device: Grab bar Assist level to toilet: Touching or steadying assistance (Pt > 75%) Assist level from toilet: Touching or steadying assistance (Pt > 75%)  Function - Chair/bed transfer Chair/bed transfer method: Stand pivot Chair/bed transfer assist level: Supervision or verbal cues Chair/bed transfer assistive device: Armrests Chair/bed transfer details: Verbal cues for precautions/safety, Verbal cues for technique  Function - Locomotion: Wheelchair Will patient use wheelchair at discharge?: No Function - Locomotion: Ambulation Assistive device: Hand held assist Max distance: 75 Assist level: Touching or steadying assistance (Pt > 75%) Assist level: Touching or steadying assistance (Pt > 75%) Assist level: Touching or steadying assistance (Pt > 75%) Walk 150 feet activity did not occur: Safety/medical concerns Assist level: Touching or steadying assistance (Pt > 75%) Walk 10 feet on uneven surfaces activity did not occur: Safety/medical concerns  Function - Comprehension Comprehension: Auditory Comprehension assist level: Follows basic conversation/direction with extra time/assistive device  Function - Expression Expression: Verbal Expression assist level: Expresses basic needs/ideas: With extra time/assistive device  Function - Social Interaction Social Interaction assist level: Interacts appropriately 75 - 89% of the time - Needs redirection for appropriate language or to  initiate interaction.  Function - Problem Solving Problem solving assist level: Solves basic 75 - 89% of the time/requires cueing 10 - 24% of the time  Function - Memory Memory assist level: Recognizes or recalls 75 - 89% of the time/requires cueing 10 - 24% of the time Patient normally able to recall (first 3 days only): Current season, Location of own room, That he or she is in a hospital   Medical Problem List and Plan: 1. Left hemiplegia secondary to right frontal ICH, CIR PT, OT, speech2. DVT Prophylaxis/Anticoagulation: SCDs. Monitor for signs of DVT 3. Pain Management: Tylenol as needed, no current pain complaints 4. Mood: Xanax 0.25 mg twice daily as needed  5. Neuropsych: This patient is capable of making decisions on her own behalf. 6. Skin/Wound Care: Routine skin checks 7. Fluids/Electrolytes/Nutrition: Routine I&O's, persistent hypokalemia, will increase. KCl 30 mEq twice a day recheck  today 8. History of nasal allergies, will try Zyrtec in place of Claritin LOS (Days) 7 A FACE TO FACE EVALUATION WAS PERFORMED  Merrie Epler E 03/31/2016, 8:32 AM

## 2016-04-01 ENCOUNTER — Inpatient Hospital Stay (HOSPITAL_COMMUNITY): Payer: Medicare Other | Admitting: Speech Pathology

## 2016-04-01 ENCOUNTER — Inpatient Hospital Stay (HOSPITAL_COMMUNITY): Payer: Medicare Other | Admitting: Physical Therapy

## 2016-04-01 ENCOUNTER — Inpatient Hospital Stay (HOSPITAL_COMMUNITY): Payer: Medicare Other | Admitting: Occupational Therapy

## 2016-04-01 NOTE — Progress Notes (Signed)
Occupational Therapy Note  Patient Details  Name: Cynthia [name] MRN: [id number] Date of Birth: 1946-09-04  Today's Date: 04/01/2016 OT Individual Time: 1300-1330 OT Individual Time Calculation (min): 30 min   No c/o pain in session   Therapeutic activities to engaged left hand in all functional tasks and visual attention to the left. Pt ambulated down to the gym area with supervision with min to mod cues for attention to the left to avoid obstacles in the hallway. Addressed attention and forced use of left hand during task of locating horseshoes in the hallway (both sides) and collecting them with left hand. Right hand was holding a cup the entire time to force use of left hand. Pt require more than reasonable time but was able to collect all items and place in bag with left hand. Pt did have difficulty releasing grip on item towards the end of task when attention was on retrieving the next item. Pt returned to room carrying a tray with both hands with empty cups on it navigating through a busy hallway with mod questioning cues. Left pt in room in recliner with husband present.   Willeen Cass [facility] 04/01/2016, 3:00 PM

## 2016-04-01 NOTE — Progress Notes (Signed)
Physical Therapy Note  Patient Details  Name: Cynthia [name] MRN: [id number] Date of Birth: [birth year] Today's Date: 04/01/2016    Time: 725-850 85 minutes  1:1 No c/o pain.  Pt performed dressing UB and LB with increased time, cuing for Lt attention, motor planning, and initiation.  Pt requires mod cuing for orientation of clothing and sequencing of dressing (attempts to put on pants before underwear and shirt before bra).  Gait in controlled enivronment with close supervision, mod cuing for Lt attention to avoid obstacles.  Searching for items in hallway with max cuing for scanning all directions.  Gait with holding object in both hands for forced use of Lt UE, improved midline orientation and alignment.  Gait on uneven surface with squat and reach task with Lt UE with close supervision, increased time for Lt UE dexterity.  Nu step 2 x 5 minutes level 2 for B LE/UE use and endurance.  Standing balance task with tapping and stepping up with min A for balance, mod cuing for sequencing, initiation and awareness of Lt LE.  Bed making activity for bimanual task with pt able to perform with supervision, cuing for use of Lt UE.  Pt improving strength and balance but still with significant deficits in attention and awareness.   Amun Stemm 04/01/2016, 8:52 AM

## 2016-04-01 NOTE — Progress Notes (Signed)
Physical Therapy Weekly Progress Note  Patient Details  Name: Cynthia [name] MRN: [id number] Date of Birth: 11/26/1945  Beginning of progress report period: March 23, 2016 End of progress report period: April 01, 2016   Patient has met 1 of 3 short term goals.  Pt continues to requiring increased cuing for Lt attention and requires min A for balance due to delayed balance reactions. Pt with decreased initiation and requires increased time for all activities.   Patient continues to demonstrate the following deficits: impaired strength, balance, awareness, attention and therefore will continue to benefit from skilled PT intervention to enhance overall performance with activity tolerance, balance, ability to compensate for deficits, functional use of  left upper extremity and left lower extremity, attention and awareness.  Patient progressing toward long term goals..  Continue plan of care.  PT Short Term Goals Week 1:  PT Short Term Goal 1 (Week 1): pt will perform gait 50' in controlled environment with supervision PT Short Term Goal 1 - Progress (Week 1): Progressing toward goal PT Short Term Goal 2 (Week 1): pt will maintain dynamic sitting balance for functional task with supervision PT Short Term Goal 2 - Progress (Week 1): Met PT Short Term Goal 3 (Week 1): pt will demo anticipatory awareness with min cuing PT Short Term Goal 3 - Progress (Week 1): Progressing toward goal  Skilled Therapeutic Interventions/Progress Updates:  Ambulation/gait training;Discharge planning;Functional mobility training;Therapeutic Activities;Wheelchair propulsion/positioning;Therapeutic Exercise;Neuromuscular re-education;Balance/vestibular training;Cognitive remediation/compensation;Community reintegration;Functional electrical stimulation;Patient/family education;Stair training;UE/LE Coordination activities;UE/LE Strength taining/ROM;Splinting/orthotics;Pain management;DME/adaptive equipment instruction     See Function Navigator for Current Functional Status.    DONAWERTH,KAREN 04/01/2016, 7:23 AM   

## 2016-04-01 NOTE — Progress Notes (Signed)
Speech Language Pathology Daily Session Note  Patient Details  Name: Cynthia [name] MRN: [id number] Date of Birth: [birth year]  Today's Date: 04/01/2016 SLP Individual Time: [id number] SLP Individual Time Calculation (min): 30 min  Short Term Goals: Week 2: SLP Short Term Goal 1 (Week 2): Pt will attend to the left of the environment during familiar, functional tasks with supervision verbal cues.  SLP Short Term Goal 2 (Week 2): Pt will recall semi-complex information with supervision verbal cues for use of external aids.   SLP Short Term Goal 3 (Week 2): Pt will complete basic ttasks with supervision verbal cues for functional problem solving.   SLP Short Term Goal 4 (Week 2): Pt will recognize and correct errors in the moment during familiar tasks with min assist verbal cues.    Skilled Therapeutic Interventions: Skilled treatment session focused on cognition goals. Pt appeared fatigued when SLP entered room. Pt with difficulty looking to her left and required Max A verbal and tactile cues. Pt required Max A verbal cues for completion of basic tasks for functional problem solving and was not able to correct errors in moment. Pt's husband present and education provided ot allow pt extra time for responding to questions and to process information. Pt with obvious difficulty with therapy tasks this day (possibly d/t fatigue) but husband commented several times that tasks were "too simple." Husband and pt receptive to education on goals of therapy tasks. Pt returned to room, transferred to recliner, all needs left within reach and she was left in husband's presence. Husband stated that he would let nursing know he was leaving and that he would put pt's safety belt on before leaving. Continue current plan of care.   Function:   Cognition Comprehension Comprehension assist level: Follows basic conversation/direction with extra time/assistive device  Expression   Expression assist level: Expresses  basic needs/ideas: With extra time/assistive device  Social Interaction Social Interaction assist level: Interacts appropriately 75 - 89% of the time - Needs redirection for appropriate language or to initiate interaction.  Problem Solving Problem solving assist level: Solves basic 75 - 89% of the time/requires cueing 10 - 24% of the time  Memory Memory assist level: Recognizes or recalls 50 - 74% of the time/requires cueing 25 - 49% of the time    Pain    Therapy/Group: Individual Therapy  Tish Begin 04/01/2016, 4:24 PM

## 2016-04-01 NOTE — Progress Notes (Addendum)
Occupational Therapy Weekly Progress Note  Patient Details  Name: Eliese Kerwood MRN: [id number] Date of Birth: [birth year]  Beginning of progress report period: March 25, 2016 End of progress report period: April 01, 2016  Today's Date: 04/01/2016 OT Individual Time: 1003-1101 OT Individual Time Calculation (min): 58 min    Patient has met 2 of 4 short term goals.  Mrs. Mandala continues to demonstrate significant deficits with left neglect and left visual field cut.  Max instructional cueing with some mod demonstrational cueing is needed for her to locate items left of her environment during ambulation and selfcare tasks.  She does exhibit greater independence with visual compensation to the left when it is a table top task however.  She continues to neglect the LUE with functional tasks even though her active movement in the UE is WFLs.  Decreased proprioception is noted from the elbow to the digits.  She needs max instructional cueing to integrate it into all selfcare tasks.  Motor planning deficits are also noted as when she is using the LUE at times she will tend to stop her movement and demonstrate inability to continue with releasing of objects.  This is also in combination with her decreased sustained attention.  Mobility wise she is at a supervision level, but just moves at a slower than normal gait speed and needs cueing for awareness of obstacles and people on the left side.  Feel she is making progress toward supervision level goals.  Will continue with current OT treatment plan with anticipated discharge of 7/27.    Patient continues to demonstrate the following deficits: decreased balance, decreased sensation, decreased LUE coordination and functional use, left neglect, decreased attention,and therefore will continue to benefit from skilled OT intervention to enhance overall performance with BADL.  Patient progressing toward long term goals..  Continue plan of care.  OT Short Term  Goals Week 2:  OT Short Term Goal 1 (Week 2): Pt will demonstrate improved L visual awareness by donning L arm in shirt first without cues. OT Short Term Goal 2 (Week 2): Pt will be able to pull underwear over hips with touching A. OT Short Term Goal 3 (Week 2): Pt will locate bathing and dressing items left of midline with no more than min questioning cueing.   OT Short Term Goal 4 (Week 2): Pt will use the LUE spontaneously during bathing and dressing tasks at a diminished level with no more than mod instructional cueing.    Skilled Therapeutic Interventions/Progress Updates:    Pt completed bathing and dressing during session.  Max instructional cueing for scanning left of midline to locate soap and shampoo. She also needed max instructional cueing to involve the LUE in washing her hair as well as for dressing tasks.  Still keeps the LUE in flexed position at her chest.  On one occasion she could not find her washcloth and stated "I don't know where it is.", when it was in her left hand.  Mod assist for donning bra as well as for tying shoes.  Pt needing assist with pulling up underpants on the left side as she was not able to use the LUE efficiently enough to pull them up when attempted secondary to motor planning.  Keeps head turned to the right most of session but did scan left of midline, only when cued.  Pt left in wheelchair at the sink with safety belt in place to work on brushing her hair.  Call button sitting on the bed with  pt being able to state how to get assistance if needed.   Therapy Documentation Precautions:  Precautions Precautions: Fall Precaution Comments: left inattention Restrictions Weight Bearing Restrictions: No  Pain: Pain Assessment Pain Assessment: No/denies pain ADL: See Function Navigator for Current Functional Status.   Therapy/Group: Individual Therapy  Mehar Kirkwood OTR/L 04/01/2016, 12:29 PM

## 2016-04-02 ENCOUNTER — Inpatient Hospital Stay (HOSPITAL_COMMUNITY): Payer: Medicare Other | Admitting: Occupational Therapy

## 2016-04-02 NOTE — Progress Notes (Signed)
Occupational Therapy Session Note  Patient Details  Name: Cynthia [name] MRN: [id number] Date of Birth: 03-09-1946  Today's Date: 04/02/2016 OT Individual Time: 1300-1345 OT Individual Time Calculation (min): 45 min    Skilled Therapeutic Interventions/Progress Updates:    Pt ambulated to the [facility] gym during session with supervision.  Had her identify objects in the hallway that were the color red.  She demonstrated decreased scanning left of midline to locate objects and needed max instructional cueing to find them.  Decreased processing of commands at times as when she was told to stop walking or turn to the left there would be a delay in her doing so.  Used the [employer] for work on visual scanning left of midline, incorporating circular scanning pattern.  Pt slightly improved with reaction time and being able to find objects in the left superior and inferior field compared to previous session.  However, she still misses 30-40 % of targets on the left side with setting on 5 seconds for duration of time that light is illuminated.  Reaction time decreased to an average of 2 seconds or less with the ones located in the left visual field.  Attempted to have pt scan environment again on her way back to the room.  Pt still needing max instructional cueing to locate objects left of midline in the hallway with pt continually only turning to the right with mobility and missing turns to stay in the hallway when attempting to ambulate back to her room.  Pt left in recliner at end of session with call button in reach and safety belt in place.    Therapy Documentation Precautions:  Precautions Precautions: Fall Precaution Comments: left inattention Restrictions Weight Bearing Restrictions: No  Pain: Pain Assessment Pain Assessment: No/denies pain ADL: See Function Navigator for Current Functional Status.   Therapy/Group: Individual Therapy  Asad Keeven OTR/L 04/02/2016, 3:43 PM

## 2016-04-02 NOTE — Progress Notes (Signed)
Subjective/Complaints:   Review of systems negative for chest pain, shortness of breath, nausea, vomiting, diarrhea or constipation. She has some urinary frequency, no burning with urination  Objective: Vital Signs: Blood pressure 144/76, pulse 80, temperature 97.4 F (36.3 C), temperature source Oral, resp. rate 18, height 5' 6"  (1.676 m), weight 66.86 kg (147 lb 6.4 oz), SpO2 100 %. No results found. Results for orders placed or performed during the hospital encounter of 03/24/16 (from the past 72 hour(s))  Basic metabolic panel     Status: Abnormal   Collection Time: 03/30/16  1:19 PM  Result Value Ref Range   Sodium 133 (L) 135 - 145 mmol/L   Potassium 4.2 3.5 - 5.1 mmol/L   Chloride 100 (L) 101 - 111 mmol/L   CO2 27 22 - 32 mmol/L   Glucose, Bld 141 (H) 65 - 99 mg/dL   BUN 12 6 - 20 mg/dL   Creatinine, Ser 0.68 0.44 - 1.00 mg/dL   Calcium 9.1 8.9 - 10.3 mg/dL   GFR calc non Af Amer >60 >60 mL/min   GFR calc Af Amer >60 >60 mL/min    Comment: (NOTE) The eGFR has been calculated using the CKD EPI equation. This calculation has not been validated in all clinical situations. eGFR's persistently <60 mL/min signify possible Chronic Kidney Disease.    Anion gap 6 5 - 15     HEENT: normal,  Cardio: RRR and No murmurs Resp: CTA B/L and Unlabored GI: BS positive and Nontender, nondistended Extremity:  Pulses positive and No Edema Skin:   Intact Neuro: Flat, Abnormal Sensory Reduced left upper extremity. Light touch, Abnormal Motor 3+/5 in the left deltoid, biceps, triceps, grip, hip flexor, knee extensor, ankle dorsiflexor, plantar flexor, Abnormal FMC Ataxic/ dec FMC and Inattention Musc/Skel:  Other No pain with upper limb or lower limb.but exhibits left visual neglect.  Gen. no acute distress   Assessment/Plan: 1. Functional deficits secondary to right frontal lobe intracranial hemorrhage which require 3+ hours per day of interdisciplinary therapy in a comprehensive  inpatient rehab setting. Physiatrist is providing close team supervision and 24 hour management of active medical problems listed below. Physiatrist and rehab team continue to assess barriers to discharge/monitor patient progress toward functional and medical goals. FIM: Function - Bathing Position: Shower Body parts bathed by patient: Right arm, Left arm, Chest, Abdomen, Front perineal area, Buttocks, Right upper leg, Left upper leg, Left lower leg, Right lower leg Body parts bathed by helper: Back Assist Level: Supervision or verbal cues  Function- Upper Body Dressing/Undressing What is the patient wearing?: Pull over shirt/dress, Bra Bra - Perfomed by patient: Thread/unthread right bra strap Bra - Perfomed by helper: Thread/unthread left bra strap, Hook/unhook bra (pull down sports bra) Pull over shirt/dress - Perfomed by patient: Thread/unthread right sleeve, Thread/unthread left sleeve, Put head through opening, Pull shirt over trunk Pull over shirt/dress - Perfomed by helper: Thread/unthread left sleeve Assist Level: Supervision or verbal cues Function - Lower Body Dressing/Undressing What is the patient wearing?: Underwear, Pants, Socks, Shoes Position: Wheelchair/chair at [employer] - Performed by patient: Thread/unthread right underwear leg, Thread/unthread left underwear leg Underwear - Performed by helper: Pull underwear up/down Pants- Performed by patient: Thread/unthread right pants leg, Thread/unthread left pants leg, Pull pants up/down Pants- Performed by helper: Pull pants up/down Non-skid slipper socks- Performed by patient: Don/doff right sock, Don/doff left sock Non-skid slipper socks- Performed by helper: Don/doff right sock, Don/doff left sock Socks - Performed by patient: Don/doff right sock,  Don/doff left sock Shoes - Performed by patient: Don/doff left shoe, Don/doff right shoe Shoes - Performed by helper: Fasten right, Fasten left Assist for footwear:  Partial/moderate assist Assist for lower body dressing:  (mod A)  Function - Toileting Toileting steps completed by patient: Performs perineal hygiene Toileting steps completed by helper: Adjust clothing prior to toileting, Adjust clothing after toileting Toileting Assistive Devices: Grab bar or rail Assist level: Touching or steadying assistance (Pt.75%) (per Dorian Heckle, NT)  Function - Toilet Transfers Toilet transfer assistive device: Grab bar Assist level to toilet: Touching or steadying assistance (Pt > 75%) Assist level from toilet: Touching or steadying assistance (Pt > 75%)  Function - Chair/bed transfer Chair/bed transfer method: Stand pivot Chair/bed transfer assist level: Supervision or verbal cues Chair/bed transfer assistive device: Armrests Chair/bed transfer details: Verbal cues for precautions/safety, Verbal cues for technique  Function - Locomotion: Wheelchair Will patient use wheelchair at discharge?: No Function - Locomotion: Ambulation Assistive device: Hand held assist Max distance: 150 Assist level: Touching or steadying assistance (Pt > 75%) Assist level: Touching or steadying assistance (Pt > 75%) Assist level: Touching or steadying assistance (Pt > 75%) Walk 150 feet activity did not occur: Safety/medical concerns Assist level: Touching or steadying assistance (Pt > 75%) Walk 10 feet on uneven surfaces activity did not occur: Safety/medical concerns  Function - Comprehension Comprehension: Auditory Comprehension assist level: Follows basic conversation/direction with extra time/assistive device  Function - Expression Expression: Verbal Expression assist level: Expresses basic needs/ideas: With extra time/assistive device  Function - Social Interaction Social Interaction assist level: Interacts appropriately 75 - 89% of the time - Needs redirection for appropriate language or to initiate interaction.  Function - Problem Solving Problem solving  assist level: Solves basic 75 - 89% of the time/requires cueing 10 - 24% of the time  Function - Memory Memory assist level: Recognizes or recalls 75 - 89% of the time/requires cueing 10 - 24% of the time Patient normally able to recall (first 3 days only): Current season, Location of own room, That he or she is in a hospital   Medical Problem List and Plan: 1. Left hemiplegia secondary to right frontal ICH, CIR PT, OT, speech2. DVT Prophylaxis/Anticoagulation: SCDs. Monitor for signs of DVT 3. Pain Management: Tylenol as needed, no current pain complaints 4. Mood: Xanax 0.25 mg twice daily as needed  5. Neuropsych: This patient is capable of making decisions on her own behalf. 6. Skin/Wound Care: Routine skin checks 7. Fluids/Electrolytes/Nutrition: Routine I&O's, persistent hypokalemia, will increase. KCl 30 mEq twice a day recheck  improved  BMP Latest Ref Rng 03/30/2016 03/26/2016 03/25/2016  Glucose 65 - 99 mg/dL 141(H) 99 101(H)  BUN 6 - 20 mg/dL 12 9 <5(L)  Creatinine 0.44 - 1.00 mg/dL 0.68 0.64 0.61  Sodium 135 - 145 mmol/L 133(L) 139 139  Potassium 3.5 - 5.1 mmol/L 4.2 3.4(L) 2.9(L)  Chloride 101 - 111 mmol/L 100(L) 103 99(L)  CO2 22 - 32 mmol/L 27 29 29   Calcium 8.9 - 10.3 mg/dL 9.1 9.2 9.1    8. History of nasal allergies, will try Zyrtec in place of Claritin LOS (Days) 9 A FACE TO FACE EVALUATION WAS PERFORMED  Angelyn Osterberg E 04/02/2016, 9:26 AM

## 2016-04-03 ENCOUNTER — Inpatient Hospital Stay (HOSPITAL_COMMUNITY): Payer: Medicare Other | Admitting: Speech Pathology

## 2016-04-03 ENCOUNTER — Inpatient Hospital Stay (HOSPITAL_COMMUNITY): Payer: Medicare Other | Admitting: Physical Therapy

## 2016-04-03 NOTE — Progress Notes (Signed)
Subjective/Complaints: No issues overnite , reading paper  Review of systems negative for chest pain, shortness of breath, nausea, vomiting, diarrhea or constipation. She has some urinary frequency, no burning with urination  Objective: Vital Signs: Blood pressure (!) 144/73, pulse 80, temperature 97.8 F (36.6 C), temperature source Oral, resp. rate 18, height 5\' 6"  (1.676 m), weight 66.9 kg (147 lb 6.4 oz), SpO2 99 %. No results found. No results found for this or any previous visit (from the past 72 hour(s)).   HEENT: normal,  Cardio: RRR and No murmurs Resp: CTA B/L and Unlabored GI: BS positive and Nontender, nondistended Extremity:  Pulses positive and No Edema Skin:   Intact Neuro: Flat, Abnormal Sensory Reduced left upper extremity. Light touch, Abnormal Motor 3+/5 in the left deltoid, biceps, triceps, grip, hip flexor, knee extensor, ankle dorsiflexor, plantar flexor, Abnormal FMC Ataxic/ dec FMC and Inattention Musc/Skel:  Other No pain with upper limb or lower limb.but exhibits left visual neglect.  Gen. no acute distress   Assessment/Plan: 1. Functional deficits secondary to right frontal lobe intracranial hemorrhage which require 3+ hours per day of interdisciplinary therapy in a comprehensive inpatient rehab setting. Physiatrist is providing close team supervision and 24 hour management of active medical problems listed below. Physiatrist and rehab team continue to assess barriers to discharge/monitor patient progress toward functional and medical goals. FIM: Function - Bathing Position: Shower Body parts bathed by patient: Right arm, Left arm, Chest, Abdomen, Front perineal area, Buttocks, Right upper leg, Left upper leg, Left lower leg, Right lower leg Body parts bathed by helper: Back Assist Level: Supervision or verbal cues  Function- Upper Body Dressing/Undressing What is the patient wearing?: Pull over shirt/dress Bra - Perfomed by patient: Thread/unthread  right bra strap Bra - Perfomed by helper: Hook/unhook bra (pull down sports bra), Thread/unthread left bra strap Pull over shirt/dress - Perfomed by patient: Thread/unthread right sleeve, Thread/unthread left sleeve, Put head through opening, Pull shirt over trunk Pull over shirt/dress - Perfomed by helper: Pull shirt over trunk Assist Level: Supervision or verbal cues Function - Lower Body Dressing/Undressing What is the patient wearing?: Underwear, Pants, Socks, Shoes Position: Wheelchair/chair at [employer] - Performed by patient: Thread/unthread right underwear leg, Thread/unthread left underwear leg Underwear - Performed by helper: Pull underwear up/down Pants- Performed by patient: Thread/unthread right pants leg, Thread/unthread left pants leg, Pull pants up/down Pants- Performed by helper: Pull pants up/down Non-skid slipper socks- Performed by patient: Don/doff right sock, Don/doff left sock Non-skid slipper socks- Performed by helper: Don/doff right sock, Don/doff left sock Socks - Performed by patient: Don/doff right sock, Don/doff left sock Shoes - Performed by patient: Don/doff left shoe, Don/doff right shoe Shoes - Performed by helper: Fasten right, Fasten left Assist for footwear: Partial/moderate assist Assist for lower body dressing:  (mod A)  Function - Toileting Toileting steps completed by patient: Performs perineal hygiene, Adjust clothing after toileting, Adjust clothing prior to toileting Toileting steps completed by helper: Adjust clothing after toileting Toileting Assistive Devices: Grab bar or rail Assist level: Touching or steadying assistance (Pt.75%) (per Dorian Heckle, NT)  Function - Toilet Transfers Toilet transfer assistive device: Grab bar Assist level to toilet: Supervision or verbal cues Assist level from toilet: Supervision or verbal cues  Function - Chair/bed transfer Chair/bed transfer method: Stand pivot Chair/bed transfer assist level:  Supervision or verbal cues Chair/bed transfer assistive device: Armrests Chair/bed transfer details: Verbal cues for precautions/safety, Verbal cues for technique  Function - Locomotion: Wheelchair Will patient  use wheelchair at discharge?: No Function - Locomotion: Ambulation Assistive device: Hand held assist Max distance: 150 Assist level: Touching or steadying assistance (Pt > 75%) Assist level: Touching or steadying assistance (Pt > 75%) Assist level: Touching or steadying assistance (Pt > 75%) Walk 150 feet activity did not occur: Safety/medical concerns Assist level: Touching or steadying assistance (Pt > 75%) Walk 10 feet on uneven surfaces activity did not occur: Safety/medical concerns  Function - Comprehension Comprehension: Auditory Comprehension assist level: Follows basic conversation/direction with extra time/assistive device  Function - Expression Expression: Verbal Expression assist level: Expresses basic needs/ideas: With extra time/assistive device  Function - Social Interaction Social Interaction assist level: Interacts appropriately 75 - 89% of the time - Needs redirection for appropriate language or to initiate interaction.  Function - Problem Solving Problem solving assist level: Solves basic 75 - 89% of the time/requires cueing 10 - 24% of the time  Function - Memory Memory assist level: Recognizes or recalls 75 - 89% of the time/requires cueing 10 - 24% of the time Patient normally able to recall (first 3 days only): Current season, Location of own room, That he or she is in a hospital   Medical Problem List and Plan: 1. Left hemiplegia secondary to right frontal ICH, CIR PT, OT, speech2. DVT Prophylaxis/Anticoagulation: SCDs. Monitor for signs of DVT 3. Pain Management: Tylenol as needed, no current pain complaints 4. Mood: Xanax 0.25 mg twice daily as needed  5. Neuropsych: This patient is capable of making decisions on her own behalf. 6.  Skin/Wound Care: Routine skin checks 7. Fluids/Electrolytes/Nutrition: Routine I&O's, persistent hypokalemia, will increase. KCl 30 mEq twice a day recheck  improved  BMP Latest Ref Rng & Units 03/30/2016 03/26/2016 03/25/2016  Glucose 65 - 99 mg/dL 141(H) 99 101(H)  BUN 6 - 20 mg/dL 12 9 <5(L)  Creatinine 0.44 - 1.00 mg/dL 0.68 0.64 0.61  Sodium 135 - 145 mmol/L 133(L) 139 139  Potassium 3.5 - 5.1 mmol/L 4.2 3.4(L) 2.9(L)  Chloride 101 - 111 mmol/L 100(L) 103 99(L)  CO2 22 - 32 mmol/L 27 29 29   Calcium 8.9 - 10.3 mg/dL 9.1 9.2 9.1    8. History of nasal allergies, will try Zyrtec in place of Claritin LOS (Days) 10 A FACE TO FACE EVALUATION WAS PERFORMED   Talisha Erby E 04/03/2016, 8:18 AM

## 2016-04-03 NOTE — Progress Notes (Signed)
Physical Therapy Session Note  Patient Details  Name: Cynthia [name] MRN: [id number] Date of Birth: [birth year]  Today's Date: 04/03/2016 PT Individual Time: 1100-1200 PT Individual Time Calculation (min): 60 min    Short Term Goals: Week 2:  PT Short Term Goal 1 (Week 2): =LTG due to estimated LOS  Skilled Therapeutic Interventions/Progress Updates:   Pt received seated in recliner, denies pain and agreeable to treatment. Gait to gym with S x175'; slow speed and occasionally reaches for rail with RUE to steady. Nustep x8 min with BUE/BLE level 5 for strengthening, aerobic endurance, attention to L extremities; requires cues for full LLE extension to mirror R side. Instructed pt in [place] A exercises; requires max cues for sequencing/problem solving to coordinate LE placement for tandem stance. Gait in hallway with therapist behind pt flashing playing cards to R/L side for visual scanning and head turns with dynamic balance; no LOB however does require significantly increased time to locate cards on L side and occasional min verbal cues for L attention when significantly delayed. Stairs 2x12 with L hand on handrail and LLE lead during ascent for increased attention to L side, LLE strengthening. Husband asking which method was preferred for stair climbing; discussed benefits with husband of leading with stronger LE on ascent for increased safety, but benefit of incorporating L side into functional tasks for increased attention and strengthening. Returned to room with S for gait x175'; remained seated in recliner with quick release belt intact and all needs in reach.   Therapy Documentation Precautions:  Precautions Precautions: Fall Precaution Comments: left inattention Restrictions Weight Bearing Restrictions: No Pain: Pain Assessment Pain Assessment: No/denies pain   See Function Navigator for Current Functional Status.   Therapy/Group: Individual Therapy  Luberta Mutter 04/03/2016, 12:14 PM

## 2016-04-04 ENCOUNTER — Inpatient Hospital Stay (HOSPITAL_COMMUNITY): Payer: Medicare Other | Admitting: Occupational Therapy

## 2016-04-04 ENCOUNTER — Inpatient Hospital Stay (HOSPITAL_COMMUNITY): Payer: Medicare Other

## 2016-04-04 ENCOUNTER — Inpatient Hospital Stay (HOSPITAL_COMMUNITY): Payer: Medicare Other | Admitting: Speech Pathology

## 2016-04-04 ENCOUNTER — Encounter (HOSPITAL_COMMUNITY): Payer: Medicare Other

## 2016-04-04 DIAGNOSIS — R41844 Frontal lobe and executive function deficit: Secondary | ICD-10-CM

## 2016-04-04 NOTE — Progress Notes (Signed)
Physical Therapy Note  Patient Details  Name: Ceola Hugley MRN: [id number] Date of Birth: 1945-12-11 Today's Date: 04/04/2016  1415-1530, 75 min individual tx Pain: none reported  Community gait x 2000'' with min assist( L hand hold) > supervision, in community settings: outdoors in [place] and garden, and in gift shop, halls and elevators. Neuromuscular re-education via visual and auditory cues, manual cues, for: visual scanning on L for requested items, stride length, velocity, use of elevator.  Pt ambulated on level terrain, sloping sidewalk, outdoor brick steps with L rail, while opening doors, retrieving items from floor.  Bean bag activity in standing to toss bags onto correct category, including squatting multiple times, manipulating bean bags with L hand. Pt continues to need extra time, mod> max cues to attend to L statically, and avoid obstacles on L when ambulating. Husband observed pt performing bean bag activity.  He guarded pt during ambulation back to room, needing mod cues from PT for standing on her L, slowing her down for safety near obstacles, etc.  Next therapist started with pt at end of session.  Aamori Mcmasters 04/04/2016, 3:26 PM

## 2016-04-04 NOTE — Progress Notes (Signed)
Occupational Therapy Session Note  Patient Details  Name: Cynthia [name] MRN: [id number] Date of Birth: 27-Dec-1945  Today's Date: 04/04/2016 OT Individual Time:  - 1100-1215   (75 min)  1st session                                          1535-1610  (35 min) 2nd session     Short Term Goals: Week 1:  OT Short Term Goal 1 (Week 1): Pt will demonstrate improved L visual awareness by donning L arm in shirt first without cues. OT Short Term Goal 1 - Progress (Week 1): Not met OT Short Term Goal 2 (Week 1): Pt will be able to pull underwear over hips with touching A. OT Short Term Goal 2 - Progress (Week 1): Not met OT Short Term Goal 3 (Week 1): Pt will bathe self with steadying A. OT Short Term Goal 3 - Progress (Week 1): Met OT Short Term Goal 4 (Week 1): Pt will demonstrate improved dynamic sitting balance during bathing. OT Short Term Goal 4 - Progress (Week 1): Met Week 2:  OT Short Term Goal 1 (Week 2): Pt will demonstrate improved L visual awareness by donning L arm in shirt first without cues. OT Short Term Goal 2 (Week 2): Pt will be able to pull underwear over hips with touching A. OT Short Term Goal 3 (Week 2): Pt will locate bathing and dressing items left of midline with no more than min questioning cueing.   OT Short Term Goal 4 (Week 2): Pt will use the LUE spontaneously during bathing and dressing tasks at a diminished level with no more than mod instructional cueing.   Week 3:     Skilled Therapeutic Interventions/Progress Updates:    1st session:  Addressed functional transfers, standing balance, attending to left; LUE release.  Pt transferred from wc to 3n1 with min assist.  Performed donning pants for toilet hygiend and clothes with CGA.  Need max verbal cues for attending to left and releasing hand off objects.    2nd session:  Addressed LUE NMRE, attending to left, and grasp and release.  Pt engaged in yoga forms with LUE holding wall while rotating with RUE out to  side.  Pt needed max cues to keep hand on wall while turning.  Perfomed 9hole peg with LUE in 14mn 29 sec; put pegs back in holes in 45 sec (pt used LUE).  Ambulated back to room with husband on her left.  Pt need max cues to problem solve where room was located using signage.  Left pt in room with all needs in place.  Husband present.   Therapy Documentation Precautions:  Precautions Precautions: Fall Precaution Comments: left inattention Restrictions Weight Bearing Restrictions: No      Pain: Pain Assessment Pain Assessment: No/denies pain ADL:      See Function Navigator for Current Functional Status.   Therapy/Group: Individual Therapy  ELisa Roca7/24/2017, 12:25 PM

## 2016-04-04 NOTE — Progress Notes (Signed)
Subjective/Complaints: Asking what should she do to help Left arm recovery.  Review of systems negative for chest pain, shortness of breath, nausea, vomiting, diarrhea or constipation. She has some urinary frequency, no burning with urination  Objective: Vital Signs: Blood pressure (!) 144/76, pulse 99, temperature 98 F (36.7 C), temperature source Oral, resp. rate 18, height 5\' 6"  (1.676 m), weight 66.9 kg (147 lb 6.4 oz), SpO2 96 %. No results found. No results found for this or any previous visit (from the past 72 hour(s)).   HEENT: normal,  Cardio: RRR and No murmurs Resp: CTA B/L and Unlabored GI: BS positive and Nontender, nondistended Extremity:  Pulses positive and No Edema Skin:   Intact Neuro: Flat, Abnormal Sensory Reduced left upper extremity. Light touch, Abnormal Motor 3+/5 in the left deltoid, biceps, triceps, grip, hip flexor, knee extensor, ankle dorsiflexor, plantar flexor, Abnormal FMC Ataxic/ dec FMC and Inattention Musc/Skel:  Other No pain with upper limb or lower limb.but exhibits left visual neglect.  Gen. no acute distress   Assessment/Plan: 1. Functional deficits secondary to right frontal lobe intracranial hemorrhage which require 3+ hours per day of interdisciplinary therapy in a comprehensive inpatient rehab setting. Physiatrist is providing close team supervision and 24 hour management of active medical problems listed below. Physiatrist and rehab team continue to assess barriers to discharge/monitor patient progress toward functional and medical goals. FIM: Function - Bathing Position: Shower Body parts bathed by patient: Right arm, Left arm, Chest, Abdomen, Front perineal area, Buttocks, Right upper leg, Left upper leg, Left lower leg, Right lower leg Body parts bathed by helper: Back Assist Level: Supervision or verbal cues  Function- Upper Body Dressing/Undressing What is the patient wearing?: Pull over shirt/dress Bra - Perfomed by patient:  Thread/unthread right bra strap Bra - Perfomed by helper: Hook/unhook bra (pull down sports bra), Thread/unthread left bra strap Pull over shirt/dress - Perfomed by patient: Thread/unthread right sleeve, Thread/unthread left sleeve, Put head through opening, Pull shirt over trunk Pull over shirt/dress - Perfomed by helper: Pull shirt over trunk Assist Level: Supervision or verbal cues Function - Lower Body Dressing/Undressing What is the patient wearing?: Underwear, Pants, Socks, Shoes Position: Wheelchair/chair at [employer] - Performed by patient: Thread/unthread right underwear leg, Thread/unthread left underwear leg Underwear - Performed by helper: Pull underwear up/down Pants- Performed by patient: Thread/unthread right pants leg, Thread/unthread left pants leg, Pull pants up/down Pants- Performed by helper: Pull pants up/down Non-skid slipper socks- Performed by patient: Don/doff right sock, Don/doff left sock Non-skid slipper socks- Performed by helper: Don/doff right sock, Don/doff left sock Socks - Performed by patient: Don/doff right sock, Don/doff left sock Shoes - Performed by patient: Don/doff left shoe, Don/doff right shoe Shoes - Performed by helper: Fasten right, Fasten left Assist for footwear: Partial/moderate assist Assist for lower body dressing:  (mod A)  Function - Toileting Toileting steps completed by patient: Performs perineal hygiene Toileting steps completed by helper: Adjust clothing prior to toileting, Adjust clothing after toileting Toileting Assistive Devices: Grab bar or rail Assist level: Touching or steadying assistance (Pt.75%)  Function - Air cabin crew transfer assistive device: Grab bar Assist level to toilet: Touching or steadying assistance (Pt > 75%) Assist level from toilet: Touching or steadying assistance (Pt > 75%)  Function - Chair/bed transfer Chair/bed transfer method: Ambulatory Chair/bed transfer assist level: Supervision  or verbal cues Chair/bed transfer assistive device: Armrests Chair/bed transfer details: Verbal cues for precautions/safety, Verbal cues for technique  Function - Locomotion: Wheelchair Will  patient use wheelchair at discharge?: No Function - Locomotion: Ambulation Assistive device: No device Max distance: 200 Assist level: Supervision or verbal cues Assist level: Supervision or verbal cues Assist level: Supervision or verbal cues Walk 150 feet activity did not occur: Safety/medical concerns Assist level: Supervision or verbal cues Walk 10 feet on uneven surfaces activity did not occur: Safety/medical concerns  Function - Comprehension Comprehension: Auditory Comprehension assist level: Understands complex 90% of the time/cues 10% of the time  Function - Expression Expression: Verbal Expression assist level: Expresses complex 90% of the time/cues < 10% of the time  Function - Social Interaction Social Interaction assist level: Interacts appropriately 90% of the time - Needs monitoring or encouragement for participation or interaction.  Function - Problem Solving Problem solving assist level: Solves complex 90% of the time/cues < 10% of the time  Function - Memory Memory assist level: Recognizes or recalls 90% of the time/requires cueing < 10% of the time Patient normally able to recall (first 3 days only): Current season   Medical Problem List and Plan: 1. Left hemiplegia secondary to right frontal ICH, CIR PT, OT, speech2. DVT Prophylaxis/Anticoagulation: SCDs. Monitor for signs of DVT 3. Pain Management: Tylenol as needed, no current pain complaints 4. Mood: Xanax 0.25 mg twice daily as needed  5. Neuropsych: This patient is capable of making decisions on her own behalf. 6. Skin/Wound Care: Routine skin checks 7. Fluids/Electrolytes/Nutrition: Routine I&O's, persistent hypokalemia, will increase. KCl 30 mEq twice a day recheck  improved  BMP Latest Ref Rng & Units  03/30/2016 03/26/2016 03/25/2016  Glucose 65 - 99 mg/dL 141(H) 99 101(H)  BUN 6 - 20 mg/dL 12 9 <5(L)  Creatinine 0.44 - 1.00 mg/dL 0.68 0.64 0.61  Sodium 135 - 145 mmol/L 133(L) 139 139  Potassium 3.5 - 5.1 mmol/L 4.2 3.4(L) 2.9(L)  Chloride 101 - 111 mmol/L 100(L) 103 99(L)  CO2 22 - 32 mmol/L 27 29 29   Calcium 8.9 - 10.3 mg/dL 9.1 9.2 9.1    8. History of nasal allergies, will try Zyrtec in place of Claritin 9.  Left neglect discussed engaging LUE in fxnl tasks as often as possible LOS (Days) 11 A FACE TO FACE EVALUATION WAS PERFORMED   Cynthia [name] 04/04/2016, 7:50 AM

## 2016-04-04 NOTE — Progress Notes (Signed)
Speech Language Pathology Daily Session Note  Patient Details  Name: Cynthia [name] MRN: [id number] Date of Birth: 1946-07-27  Today's Date: 04/04/2016 SLP Individual Time: 0832-0900 SLP Individual Time Calculation (min): 28 min   Short Term Goals: Week 2: SLP Short Term Goal 1 (Week 2): Pt will attend to the left of the environment during familiar, functional tasks with supervision verbal cues.  SLP Short Term Goal 2 (Week 2): Pt will recall semi-complex information with supervision verbal cues for use of external aids.   SLP Short Term Goal 3 (Week 2): Pt will complete basic ttasks with supervision verbal cues for functional problem solving.   SLP Short Term Goal 4 (Week 2): Pt will recognize and correct errors in the moment during familiar tasks with min assist verbal cues.    Skilled Therapeutic Interventions: Skilled treatment session focused on addressing cognition goals.  Upon SLP arrival patient stated that she had been sitting too long and wanted to work on her left attention.  As a result, SLP facilitated session by providing extra time and Max faded to Mod assist verbal cues to scan alternatively left/right to identify room numbers while walking down the hall.  Half way through session SLP requested patient route find back to room, which she did again with extra time and Supervision level verbal cues to locate external aids.  Continue with current plan of care.   Function:  Cognition Comprehension Comprehension assist level: Understands complex 90% of the time/cues 10% of the time  Expression   Expression assist level: Expresses complex 90% of the time/cues < 10% of the time  Social Interaction Social Interaction assist level: Interacts appropriately 90% of the time - Needs monitoring or encouragement for participation or interaction.  Problem Solving Problem solving assist level: Solves complex 90% of the time/cues < 10% of the time  Memory Memory assist level: Recognizes or  recalls 90% of the time/requires cueing < 10% of the time    Pain Pain Assessment Pain Assessment: No/denies pain  Therapy/Group: Individual Therapy  Carmelia Roller., CCC-SLP [id number]  [place] 04/04/2016, 8:59 AM

## 2016-04-05 ENCOUNTER — Inpatient Hospital Stay (HOSPITAL_COMMUNITY): Payer: Medicare Other | Admitting: Occupational Therapy

## 2016-04-05 ENCOUNTER — Inpatient Hospital Stay (HOSPITAL_COMMUNITY): Payer: Medicare Other | Admitting: Speech Pathology

## 2016-04-05 ENCOUNTER — Inpatient Hospital Stay (HOSPITAL_COMMUNITY): Payer: Medicare Other | Admitting: Physical Therapy

## 2016-04-05 DIAGNOSIS — I69319 Unspecified symptoms and signs involving cognitive functions following cerebral infarction: Secondary | ICD-10-CM

## 2016-04-05 NOTE — Progress Notes (Signed)
Physical Therapy Session Note  Patient Details  Name: Cynthia [name] MRN: [id number] Date of Birth: 06/02/1946  Today's Date: 04/05/2016 PT Individual Time: 7289 (make up minutes)-1200 PT Individual Time Calculation (min): 20 min make up minutes   Short Term Goals: Week 1:  PT Short Term Goal 1 (Week 1): pt will perform gait 50' in controlled environment with supervision PT Short Term Goal 1 - Progress (Week 1): Progressing toward goal PT Short Term Goal 2 (Week 1): pt will maintain dynamic sitting balance for functional task with supervision PT Short Term Goal 2 - Progress (Week 1): Met PT Short Term Goal 3 (Week 1): pt will demo anticipatory awareness with min cuing PT Short Term Goal 3 - Progress (Week 1): Progressing toward goal Week 2:  PT Short Term Goal 1 (Week 2): =LTG due to estimated LOS  Skilled Therapeutic Interventions/Progress Updates:   Pt seen for make up session due to missed minutes.  Pt received in w/c and agreeable to therapy.  Pt reporting need to use toilet.  Pt performed ambulatory transfer w/c <> toilet with supervision with verbal cues for attention to L environment, sequencing, initiation and attention to use of LUE for clothing doffing and donning; also performed hand hygiene at sink with supervision and verbal cues for use of LUE for functional task.  Performed gait in controlled environment with supervision in very crowded, busy hallway with pt negotiating moving obstacles on L and R with task of locating last door on L.  In gym pt performed ramp and curb negotiation + ambulating across compliant surface first with single focus on gait task and then addition of dual task of carrying/balancing object in LUE palm open with supervision-min A.  Performed simulated car transfer cuing pt to perform all tasks with LUE (open/close door, seat belt, etc).  Pt performed with supervision but required mod cues to attend to LUE position and safety.  Returned to room and pt  returned to w/c with quick release belt in place and set up with lunch tray.  Therapy Documentation Precautions:  Precautions Precautions: Fall Precaution Comments: left inattention Restrictions Weight Bearing Restrictions: No Pain: Pain Assessment Pain Assessment: No/denies pain   See Function Navigator for Current Functional Status.   Therapy/Group: Individual Therapy  Raylene Everts Faucette 04/05/2016, 12:12 PM

## 2016-04-05 NOTE — Progress Notes (Signed)
Speech Language Pathology Daily Session Note  Patient Details  Name: Cynthia [name] MRN: [id number] Date of Birth: 1946-03-25  Today's Date: 04/05/2016 SLP Individual Time: 0930-1030 SLP Individual Time Calculation (min): 60 min   Short Term Goals: Week 2: SLP Short Term Goal 1 (Week 2): Pt will attend to the left of the environment during familiar, functional tasks with supervision verbal cues.  SLP Short Term Goal 2 (Week 2): Pt will recall semi-complex information with supervision verbal cues for use of external aids.   SLP Short Term Goal 3 (Week 2): Pt will complete basic ttasks with supervision verbal cues for functional problem solving.   SLP Short Term Goal 4 (Week 2): Pt will recognize and correct errors in the moment during familiar tasks with min assist verbal cues.    Skilled Therapeutic Interventions:   Skilled treatment session focused on addressing cognition goals. SLP facilitated session by providing Max assist to scan left while walking to therapy room with hand held physical assist.  SLP also facilitated session with deductive reasoning task with Mod faded to [place] assist verbal and visual cues.  Patient verbalized acknowledgement of how task was challenging and that was surprising to her.  Patient required Min verbal cues to navigate to gym and then once she had reached that marker she was Mod I to navigate back to room and locate room on the left, likely due to carryover following repetitions.  Continue with current plan of care.    Function:   Cognition Comprehension Comprehension assist level: Understands complex 90% of the time/cues 10% of the time  Expression   Expression assist level: Expresses complex 90% of the time/cues < 10% of the time  Social Interaction Social Interaction assist level: Interacts appropriately 90% of the time - Needs monitoring or encouragement for participation or interaction.  Problem Solving Problem solving assist level: Solves complex  90% of the time/cues < 10% of the time  Memory Memory assist level: Recognizes or recalls 90% of the time/requires cueing < 10% of the time    Pain Pain Assessment Pain Assessment: No/denies pain  Therapy/Group: Individual Therapy  Carmelia Roller., CCC-SLP [id number]  [place] 04/05/2016, 11:23 AM

## 2016-04-05 NOTE — Progress Notes (Signed)
Subjective/Complaints: No issues overnite Grasping napkin with left while setting up meal tray with RIght  Focused on breakfast not attending to conversation well  Review of systems negative for chest pain, shortness of breath, nausea, vomiting, diarrhea or constipation. She has some urinary frequency, no burning with urination  Objective: Vital Signs: Blood pressure (!) 148/72, pulse 87, temperature 97.9 F (36.6 C), temperature source Oral, resp. rate 18, height 5\' 6"  (1.676 m), weight 66.9 kg (147 lb 6.4 oz), SpO2 99 %. No results found. No results found for this or any previous visit (from the past 72 hour(s)).   HEENT: normal,  Cardio: RRR and No murmurs Resp: CTA B/L and Unlabored GI: BS positive and Nontender, nondistended Extremity:  Pulses positive and No Edema Skin:   Intact Neuro: Flat, Abnormal Sensory Reduced left upper extremity. Light touch, Abnormal Motor 3+/5 in the left deltoid, biceps, triceps, grip, hip flexor, knee extensor, ankle dorsiflexor, plantar flexor, Abnormal FMC Ataxic/ dec FMC and Inattention Musc/Skel:  Other No pain with upper limb or lower limb.but exhibits left visual neglect.  Gen. no acute distress   Assessment/Plan: 1. Functional deficits secondary to right frontal lobe intracranial hemorrhage which require 3+ hours per day of interdisciplinary therapy in a comprehensive inpatient rehab setting. Physiatrist is providing close team supervision and 24 hour management of active medical problems listed below. Physiatrist and rehab team continue to assess barriers to discharge/monitor patient progress toward functional and medical goals. FIM: Function - Bathing Position: Shower Body parts bathed by patient: Right arm, Left arm, Chest, Abdomen, Front perineal area, Buttocks, Right upper leg, Left upper leg, Left lower leg, Right lower leg, Back Body parts bathed by helper: Back Assist Level: Supervision or verbal cues  Function- Upper Body  Dressing/Undressing What is the patient wearing?: Pull over shirt/dress Bra - Perfomed by patient: Thread/unthread right bra strap Bra - Perfomed by helper: Hook/unhook bra (pull down sports bra), Thread/unthread left bra strap Pull over shirt/dress - Perfomed by patient: Thread/unthread right sleeve, Put head through opening, Pull shirt over trunk Pull over shirt/dress - Perfomed by helper: Thread/unthread left sleeve Assist Level: Touching or steadying assistance(Pt > 75%) Function - Lower Body Dressing/Undressing What is the patient wearing?: Underwear, Pants, Socks, Shoes Position: Wheelchair/chair at [employer] - Performed by patient: Thread/unthread right underwear leg, Thread/unthread left underwear leg Underwear - Performed by helper: Pull underwear up/down Pants- Performed by patient: Thread/unthread right pants leg, Thread/unthread left pants leg, Pull pants up/down Pants- Performed by helper: Pull pants up/down Non-skid slipper socks- Performed by patient: Don/doff right sock, Don/doff left sock Non-skid slipper socks- Performed by helper: Don/doff right sock, Don/doff left sock Socks - Performed by patient: Don/doff right sock, Don/doff left sock Shoes - Performed by patient: Don/doff right shoe, Don/doff left shoe, Fasten right, Fasten left Shoes - Performed by helper: Fasten left (increased time) Assist for footwear: Supervision/touching assist Assist for lower body dressing: Touching or steadying assistance (Pt > 75%)  Function - Toileting Toileting steps completed by patient: Adjust clothing prior to toileting, Performs perineal hygiene Toileting steps completed by helper: Adjust clothing after toileting Toileting Assistive Devices: Grab bar or rail Assist level: Supervision or verbal cues  Function - Air cabin crew transfer assistive device: Grab bar Assist level to toilet: Touching or steadying assistance (Pt > 75%) Assist level from toilet: Touching or  steadying assistance (Pt > 75%)  Function - Chair/bed transfer Chair/bed transfer method: Ambulatory Chair/bed transfer assist level: Supervision or verbal cues Chair/bed transfer assistive  device: Armrests Chair/bed transfer details: Verbal cues for precautions/safety  Function - Locomotion: Wheelchair Will patient use wheelchair at discharge?: No Function - Locomotion: Ambulation Assistive device: No device Max distance: 2000 Assist level: Touching or steadying assistance (Pt > 75%) Assist level: Touching or steadying assistance (Pt > 75%) Assist level: Touching or steadying assistance (Pt > 75%) Walk 150 feet activity did not occur: Safety/medical concerns Assist level: Touching or steadying assistance (Pt > 75%) Walk 10 feet on uneven surfaces activity did not occur: Safety/medical concerns  Function - Comprehension Comprehension: Auditory Comprehension assist level: Understands basic 90% of the time/cues < 10% of the time  Function - Expression Expression: Verbal Expression assist level: Expresses basic needs/ideas: With no assist  Function - Social Interaction Social Interaction assist level: Interacts appropriately 90% of the time - Needs monitoring or encouragement for participation or interaction.  Function - Problem Solving Problem solving assist level: Solves complex problems: With extra time  Function - Memory Memory assist level: More than reasonable amount of time Patient normally able to recall (first 3 days only): Staff names and faces   Medical Problem List and Plan: 1. Left hemiplegia secondary to right frontal ICH, CIR PT, OT, speech2. DVT Prophylaxis/Anticoagulation: SCDs. Monitor for signs of DVT 3. Pain Management: Tylenol as needed, no current pain complaints 4. Mood: Xanax 0.25 mg twice daily as needed  5. Neuropsych: This patient is capable of making decisions on her own behalf. 6. Skin/Wound Care: Routine skin checks 7.  Fluids/Electrolytes/Nutrition: Routine I&O's, persistent hypokalemia, will increase. KCl 30 mEq twice a day recheck  improved  BMP Latest Ref Rng & Units 03/30/2016 03/26/2016 03/25/2016  Glucose 65 - 99 mg/dL 141(H) 99 101(H)  BUN 6 - 20 mg/dL 12 9 <5(L)  Creatinine 0.44 - 1.00 mg/dL 0.68 0.64 0.61  Sodium 135 - 145 mmol/L 133(L) 139 139  Potassium 3.5 - 5.1 mmol/L 4.2 3.4(L) 2.9(L)  Chloride 101 - 111 mmol/L 100(L) 103 99(L)  CO2 22 - 32 mmol/L 27 29 29   Calcium 8.9 - 10.3 mg/dL 9.1 9.2 9.1    8. History of nasal allergies, will try Zyrtec in place of Claritin 9.  Left neglect still not incorpaorating LUE into fxnl tasks LOS (Days) 12 A FACE TO FACE EVALUATION WAS PERFORMED   KIRSTEINS,ANDREW E 04/05/2016, 7:23 AM

## 2016-04-05 NOTE — Progress Notes (Signed)
Occupational Therapy Session Note  Patient Details  Name: Cynthia [name] MRN: [id number] Date of Birth: 1946/05/31  Today's Date: 04/05/2016 OT Individual Time: [id number] OT Individual Time Calculation (min): 75 min     Short Term Goals: Week 2:  OT Short Term Goal 1 (Week 2): Pt will demonstrate improved L visual awareness by donning L arm in shirt first without cues. OT Short Term Goal 2 (Week 2): Pt will be able to pull underwear over hips with touching A. OT Short Term Goal 3 (Week 2): Pt will locate bathing and dressing items left of midline with no more than min questioning cueing.   OT Short Term Goal 4 (Week 2): Pt will use the LUE spontaneously during bathing and dressing tasks at a diminished level with no more than mod instructional cueing.    Skilled Therapeutic Interventions/Progress Updates:    Pt completed bathing and dressing during session with overall supervision level.  Decreased sustained attention to tasks with decreased attention to the left UE and left environment throughout.  She would begin taking off her clothing for shower and forget to remove the left side, and try to proceed with removing another item without finishing the left.   Max instructional cueing to let go of objects on the left side and to scan left of midline.  Min assist for fastening bra but supervision for all other aspects of dressing.  Min instructional cueing for orientation of pullover shirt as she attempted to put it on inside out initially.  Finished session with environmental scanning task of locating items in the hallway.  Pt missed 4/7 on first attempt with all items on the left side and missed 2/7 on second attempt before returning back to the room.  Pt left in wheelchair at end of session with safety belt in place.    Therapy Documentation Precautions:  Precautions Precautions: Fall Precaution Comments: left inattention Restrictions Weight Bearing Restrictions: No  Pain: Pain  Assessment Pain Assessment: No/denies pain ADL: Other Treatments:    See Function Navigator for Current Functional Status.   Therapy/Group: Individual Therapy  Alverto Shedd OTR/L 04/05/2016, 3:55 PM

## 2016-04-05 NOTE — Consult Note (Signed)
  NEUROBEHAVIORAL STATUS EXAM - [facility]   MEDICAL NECESSITY:  Cynthia [name] was seen on the [facility] for a neurobehavioral status exam owing to the patient's diagnosis of CVA, and to assist in treatment planning during admission.   Records indicate that [name] is a "70 y.o. right handed female admitted 03/20/2016 with headache, left-sided weakness, and altered mental status and aphasia.Patient lives with husband in [place]. Independent prior to admission. 2 level home with bedroom on first floor. Husband can assist as needed. CT of the head showed acute anterior right frontal lobe intra-axial hemorrhage estimated hemorrhage volume 20 mL. Surrounding edema with mild regional mass effect. MRI of the brain showed late acute early subacute intraparenchymal hemorrhage in the right posterior frontal lobe with layering and surrounding edema. CTA of head and neck shows right frontal lobe bilobed hematoma spanning 3.2 x 4.3 x 3.7 cm with surrounding vasogenic edema and mass effect upon the right ventricle appearing similar to recent MRI CT. Angiogram of head and neck showed no significant stenosis of either vertebral artery or AVM.  Maintain on a regular diet. Physical therapy evaluation completed 03/21/2016 with recommendations of physical medicine rehabilitation consult."   During today's visit, [name] endorsed experiencing post-stroke visuoperceptual issues, attention and concentration deficits, and slow processing speed. She also feels that her speech is more monotonous but improving.  From an emotional standpoint, [name] described her mood as "pretty well." She has a history of being diagnosed and treated for depression 15+ years ago. At that time she saw a psychologist for counseling which was beneficial as well was took Wellbutrin and Xanax which she also found helpful. She continues to take  Xanax PRN for anxiety that she described as well-controlled. She has been provided that during this hospitalization as needed. No adjustment issues endorsed. Suicidal/homicidal ideation, plan or intent was denied. No manic or hypomanic episodes were reported. The patient denied ever experiencing any auditory/visual hallucinations. No major behavioral or personality changes were endorsed.   Patient feels that progress is being made in therapy. No barriers to therapy identified. She has been satisfied with the rehabilitation staff. Her husband is her biggest source of support and visits regularly.  PROCEDURES: [2 units [id number] Diagnostic clinical interview  Review of available records Montreal Cognitive Assessment (short-form)  MENTAL STATUS: [name]' mental status exam score of 16/22 is below the cutoff used to indicate cognitive impairment. She was generally oriented to time and place and learning and memory were intact. However, there were indications of executive dysfunction including problems with response inhibition, mental calculation, letter fluency, and abstraction.   IMPRESSION: Overall, [name] endorsed experiencing certain cognitive deficits post-stroke and metal status exam suggests issues primarily with executive cognitive processes. Her performance is most consistent with a diagnosis of Mild Neurocognitive Disorder (i.e., mild cognitive impairment) with the most likely etiology being cerebrovascular compromise. There are no clear indications of significant clinical psychopathology. Given these findings, I recommend undergoing comprehensive neuropsychological evaluation upon discharge. This was discussed with her and she was agreeable. I am happy to see her as an outpatient. I do not feel that neuropsychology needs to follow-up for the remainder of this admission unless requested by the patient or rehabilitation staff.  DIAGNOSES:  Mild Neurocognitive Disorder (i.e., mild  cognitive impairment) likely secondary to cerebrovascular compromise Unspecified Anxiety Disorder (well-controlled)    Cynthia [name], Psy.D., ABN Board-Certified Clinical Neuropsychologist

## 2016-04-05 NOTE — Progress Notes (Signed)
Physical Therapy Note  Patient Details  Name: Cynthia [name] MRN: [id number] Date of Birth: 1946/03/10 Today's Date: 04/05/2016    Time: 730-825 55 minutes  1:1 No c/o pain. Pt performed gathering clothes and dressing with min A for bra and pants due to pt donning bra before taking off nightgown, min A for orienting pants with max cuing. Pt requires increased time for motor planning and problem solving through all dressing tasks.  Grooming at sink with mod cues to attend to Lt side.  Quadruped and tall kneeling wt shifts and alt UE raises with emphasis on forced use of Lt UE and LE, pt requires frequent rests due to Lt UE fatigue. Searching and scanning task in hallway with pt requiring mod cuing to look left and find items. Pt with difficulty releasing items from Lt UE when fatigued.   Pt still continues with limited awareness of Lt neglect requiring increased cuing to attend to Lt side.   Keltie Labell 04/05/2016, 8:23 AM

## 2016-04-06 ENCOUNTER — Inpatient Hospital Stay (HOSPITAL_COMMUNITY): Payer: Medicare Other

## 2016-04-06 ENCOUNTER — Inpatient Hospital Stay (HOSPITAL_COMMUNITY): Payer: Medicare Other | Admitting: Occupational Therapy

## 2016-04-06 ENCOUNTER — Inpatient Hospital Stay (HOSPITAL_COMMUNITY): Payer: Medicare Other | Admitting: Speech Pathology

## 2016-04-06 ENCOUNTER — Inpatient Hospital Stay (HOSPITAL_COMMUNITY): Payer: Medicare Other | Admitting: Physical Therapy

## 2016-04-06 NOTE — Progress Notes (Signed)
Social Work Patient ID: Cynthia [name], female   DOB: 08-12-1946, 70 y.o.   MRN: [id number]   Met with pt and husband to discuss team conference progress toward supervision level goals and discharge tomorrow. Both feel she has done well and are looking forward to going home.  Discussed OP rehab and both are in agreement, will make referral. No equipment needs. husband aware she will require 24 hr supervision at discharge. Ready for discharge tomorrow.

## 2016-04-06 NOTE — Progress Notes (Addendum)
Physical Therapy Discharge Summary  Patient Details  Name: Cynthia [name] MRN: [id number] Date of Birth: 1946-05-21  Today's Date: 04/06/2016 PT Individual Time: 1530-1600; 1530-1600 PT Individual Time Calculation (min): 30 min, 30 min  Patient has met 8 of 8 long term goals due to improved activity tolerance, improved balance, improved postural control, increased strength, increased range of motion, ability to compensate for deficits, functional use of  left upper extremity and left lower extremity, improved attention, improved awareness and improved coordination.  Patient to discharge at an ambulatory level Supervision.   Patient's care partner is independent to provide the necessary cognitive assistance at discharge.  Reasons goals not met: n/a  Recommendation:  Patient will benefit from ongoing skilled PT services in outpatient setting to continue to advance safe functional mobility, address ongoing impairments in attention, awareness, vision, balance, motor, and minimize fall risk.  Equipment: No equipment provided  Reasons for discharge: treatment goals met and discharge from hospital  Patient/family agrees with progress made and goals achieved: Yes  PT Discharge tx 1 today: Pt demonstrated anticipatory awareness while dressing self, by self correcting with extra time, L shoe laces and L pants leg without cues. Gait and mobility as per function ratings.  Dual task activity for L hand then R hand to manipulate small figures on table to L in unsupported sitting and standing. During counting figures she had placed in rows, pt miscounted when pointing with L and R hands; with cues to slow down she was accurate.  Balance strategies tested; see below. Pt left resting in w/c with quick release belt applied and all needs within reach.  tx 2 today: husband here for further family ed.  He return- demonstrated simulated car transfer, guarding her on L during gait on level tile and up/down 4  steps with R rail as per back door of their house, appropriate cueing for safety regarding L neglect and L inattention, and standing heel cord stretch ex. Pt issued hand out for standing heel cord stretch.    Precautions/Restrict- falls due to L inattention   Pain- none noted   Vision/Perception - ? Mild L hemianopsia, severe L inattention, wears bifocals     Cognition Overall Cognitive Status: Impaired/Different from baseline Arousal/Alertness: Awake/alert Orientation Level: Oriented X4 Attention: Selective Awareness: Impaired Awareness Impairment: Anticipatory impairment Sensation Sensation Light Touch: Appears Intact Proprioception: Appears Intact Proprioception Impaired Details: Impaired LLE (dealyed awareness) Coordination Gross Motor Movements are Fluid and Coordinated: Yes Fine Motor Movements are Fluid and Coordinated: No Heel Shin Test: slightly impaired accuracy and speed LLE Motor  Motor Motor: Hemiplegia Motor - Discharge Observations: mildly decreased strengthe LLE  Mobility Bed Mobility Bed Mobility: Rolling Right;Rolling Left;Supine to Sit;Sit to Supine Rolling Right: 6: Modified independent (Device/Increase time) Rolling Left: 6: Modified independent (Device/Increase time) Supine to Sit: 6: Modified independent (Device/Increase time) Sit to Supine: 6: Modified independent (Device/Increase time) Transfers Transfers: Yes (stand pivot with supervision due to vision/inattention) Locomotion  Ambulation Ambulation: Yes Ambulation/Gait Assistance: 5: Supervision Ambulation Distance (Feet): 150 Feet Assistive device: None Ambulation/Gait Assistance Details: Verbal cues for precautions/safety Gait Gait: Yes Gait Pattern: Impaired Gait Pattern: Narrow base of support;Trunk rotated posteriorly on left;Decreased trunk rotation;Step-to pattern (L hip ER) Gait velocity: 3.28'/sec for 10 MWT High Level Ambulation High Level Ambulation: Backwards walking;Head  turns Backwards Walking: supervision Head Turns: supervision Stairs / Additional Locomotion Stairs: Yes Stairs Assistance: 5: Supervision Stair Management Technique: Alternating pattern;One rail Left Number of Stairs: 12 Height of Stairs: 6 Ramp: 5: Supervision  Curb: 5: Psychiatric nurse: No  Trunk/Postural Assessment  Cervical Assessment Cervical Assessment: Exceptions to [facility] (R cervical rotation at rest) Thoracic Assessment Thoracic Assessment: Within Functional Limits Lumbar Assessment Lumbar Assessment: Within Functional Limits Postural Control Postural Control: Deficits on evaluation (pt stands with R wt bearing < LLe wt bearing, L knee flexed) Trunk Control: slight L lean in sitting Righting Reactions: with external perturbations, pt demonstrated bil ankle strategies, and bil stepping strategies, absent bil hip strategies  Balance Balance Balance Assessed: Yes Static Sitting Balance Static Sitting - Level of Assistance: 6: Modified independent (Device/Increase time) Dynamic Sitting Balance Dynamic Sitting - Level of Assistance: 5: Stand by assistance Static Standing Balance Static Standing - Level of Assistance: 5: Stand by assistance Dynamic Standing Balance Dynamic Standing - Level of Assistance: 5: Stand by assistance Extremity Assessment      RLE Assessment RLE Assessment: Within Functional Limits LLE Assessment LLE Assessment: Exceptions to [facility] LLE Strength LLE Overall Strength Comments: grossly in sitting: 4-/5 hip flexion, knee flexion 4+/5 hip abd/adduction, knee extensiona and ankle DF/PF; needs extra time to process how to resist movements   See Function Navigator for Current Functional Status.  Leylany Nored 04/06/2016, 4:28 PM

## 2016-04-06 NOTE — Progress Notes (Signed)
Subjective/Complaints: Discussed Neuropsych consult  Review of systems negative for chest pain, shortness of breath, nausea, vomiting, diarrhea or constipation. She has some urinary frequency, no burning with urination  Objective: Vital Signs: Blood pressure (!) 142/77, pulse 83, temperature 97.4 F (36.3 C), temperature source Oral, resp. rate 16, height _0  (1.676 m), weight 66.9 kg (147 lb 6.4 oz), SpO2 98 %. No results found. No results found for this or any previous visit (from the past 72 hour(s)).   HEENT: normal,  Cardio: RRR and No murmurs Resp: CTA B/L and Unlabored GI: BS positive and Nontender, nondistended Extremity:  Pulses positive and No Edema Skin:   Intact Neuro: Flat, Abnormal Sensory Reduced left upper extremity. Light touch, Abnormal Motor 3+/5 in the left deltoid, biceps, triceps, grip, hip flexor, knee extensor, ankle dorsiflexor, plantar flexor, Abnormal FMC Ataxic/ dec FMC and Inattention Musc/Skel:  Other No pain with upper limb or lower limb.but exhibits left visual neglect.  Gen. no acute distress   Assessment/Plan: 1. Functional deficits secondary to right frontal lobe intracranial hemorrhage which require 3+ hours per day of interdisciplinary therapy in a comprehensive inpatient rehab setting. Physiatrist is providing close team supervision and 24 hour management of active medical problems listed below. Physiatrist and rehab team continue to assess barriers to discharge/monitor patient progress toward functional and medical goals. FIM: Function - Bathing Position: Shower Body parts bathed by patient: Right arm, Left arm, Chest, Abdomen, Front perineal area, Buttocks, Right upper leg, Left upper leg, Left lower leg, Right lower leg Body parts bathed by helper: Back Bathing not applicable: Back Assist Level: Supervision or verbal cues  Function- Upper Body Dressing/Undressing What is the patient wearing?: Pull over shirt/dress Bra - Perfomed by  patient: Thread/unthread right bra strap, Thread/unthread left bra strap Bra - Perfomed by helper: Hook/unhook bra (pull down sports bra) Pull over shirt/dress - Perfomed by patient: Thread/unthread right sleeve, Put head through opening, Pull shirt over trunk, Thread/unthread left sleeve Pull over shirt/dress - Perfomed by helper: Thread/unthread left sleeve Assist Level: Supervision or verbal cues Function - Lower Body Dressing/Undressing What is the patient wearing?: Underwear, Pants, Socks, Shoes Position: Wheelchair/chair at [employer] - Performed by patient: Thread/unthread right underwear leg, Thread/unthread left underwear leg, Pull underwear up/down Underwear - Performed by helper: Pull underwear up/down Pants- Performed by patient: Thread/unthread right pants leg, Thread/unthread left pants leg, Pull pants up/down Pants- Performed by helper: Pull pants up/down Non-skid slipper socks- Performed by patient: Don/doff right sock, Don/doff left sock Non-skid slipper socks- Performed by helper: Don/doff right sock, Don/doff left sock Socks - Performed by patient: Don/doff right sock, Don/doff left sock Shoes - Performed by patient: Don/doff right shoe, Don/doff left shoe, Fasten right, Fasten left Shoes - Performed by helper: Don/doff right shoe, Don/doff left shoe, Fasten right, Fasten left Assist for footwear: Supervision/touching assist Assist for lower body dressing: Supervision or verbal cues  Function - Toileting Toileting steps completed by patient: Performs perineal hygiene Toileting steps completed by helper: Adjust clothing prior to toileting, Adjust clothing after toileting Toileting Assistive Devices: Grab bar or rail Assist level: Touching or steadying assistance (Pt.75%)  Function - Air cabin crew transfer assistive device: Grab bar Assist level to toilet: Supervision or verbal cues Assist level from toilet: Supervision or verbal cues  Function -  Chair/bed transfer Chair/bed transfer method: Ambulatory Chair/bed transfer assist level: Supervision or verbal cues Chair/bed transfer assistive device: Armrests Chair/bed transfer details: Verbal cues for precautions/safety  Function - Locomotion: Wheelchair Will  patient use wheelchair at discharge?: No Function - Locomotion: Ambulation Assistive device: No device Max distance: 150 Assist level: Supervision or verbal cues Assist level: Supervision or verbal cues Assist level: Supervision or verbal cues Walk 150 feet activity did not occur: Safety/medical concerns Assist level: Supervision or verbal cues Walk 10 feet on uneven surfaces activity did not occur: Safety/medical concerns Assist level: Supervision or verbal cues  Function - Comprehension Comprehension: Auditory Comprehension assist level: Follows basic conversation/direction with no assist  Function - Expression Expression: Verbal Expression assist level: Expresses basic needs/ideas: With no assist  Function - Social Interaction Social Interaction assist level: Interacts appropriately 90% of the time - Needs monitoring or encouragement for participation or interaction.  Function - Problem Solving Problem solving assist level: Solves complex 90% of the time/cues < 10% of the time  Function - Memory Memory assist level: Requires cues to use assistive device Patient normally able to recall (first 3 days only): Location of own room, Current season   Medical Problem List and Plan: 1. Left hemiplegia secondary to right frontal ICH, Team conference today please see physician documentation under team conference tab, met with team face-to-face to discuss problems,progress, and goals. Formulized individual treatment plan based on medical history, underlying problem and comorbidities.2. DVT Prophylaxis/Anticoagulation: SCDs. Monitor for signs of DVT 3. Pain Management: Tylenol as needed, no current pain complaints 4. Mood:  Xanax 0.25 mg twice daily as needed , Discussed risk of worsening depression post CVA 5. Neuropsych: This patient is capable of making decisions on her own behalf. 6. Skin/Wound Care: Routine skin checks 7. Fluids/Electrolytes/Nutrition: Routine I&O's, persistent hypokalemia, will increase. KCl 30 mEq twice a day recheck  improved  BMP Latest Ref Rng & Units 03/30/2016 03/26/2016 03/25/2016  Glucose 65 - 99 mg/dL 141(H) 99 101(H)  BUN 6 - 20 mg/dL 12 9 <5(L)  Creatinine 0.44 - 1.00 mg/dL 0.68 0.64 0.61  Sodium 135 - 145 mmol/L 133(L) 139 139  Potassium 3.5 - 5.1 mmol/L 4.2 3.4(L) 2.9(L)  Chloride 101 - 111 mmol/L 100(L) 103 99(L)  CO2 22 - 32 mmol/L _0 Calcium 8.9 - 10.3 mg/dL 9.1 9.2 9.1    8. History of nasal allergies, will try Zyrtec in place of Claritin 9.  Left neglect-pt more aware of this LOS (Days) 13 A FACE TO FACE EVALUATION WAS PERFORMED   KIRSTEINS,ANDREW E 04/06/2016, 8:01 AM

## 2016-04-06 NOTE — Plan of Care (Signed)
Problem: RH Vision Goal: RH LTG Vision (Specify) Outcome: Not Met (add Reason) Pt needs mod to max instructional cueing to locate objects on the left side.

## 2016-04-06 NOTE — Progress Notes (Signed)
Speech Language Pathology Discharge Summary  Patient Details  Name: Cynthia [name] MRN: [id number] Date of Birth: 18-Dec-1945  Today's Date: 04/06/2016 SLP Individual Time: 1130-1200 SLP Individual Time Calculation (min): 30 min    Skilled Therapeutic Interventions:  Pt participated in review of goals with pt verbalizing agreement re: goals met. Reiterated importance of 24/7 supervision with emphasis on importance of assistance with meds/finances. Pt verbalized agreement with plan for spouse to assist. Spouse not present during session. Pt able to recall meds with 100% acc mod I.     Patient has met 4 of 4 long term goals.  Patient to discharge at overall Supervision level.  Reasons goals not met:     Clinical Impression/Discharge Summary: Pt would benefit from ongoing SLP services at the outpatient level to further progress pt's independence. Pt has made excellent gains with cognitive goals, but requires supervision due to emergent awareness of deficits with her ability to verbalize limitations stronger than her ability to compensate for those limitations.  Care Partner:  Caregiver Able to Provide Assistance: Yes  Type of Caregiver Assistance: Cognitive  Recommendation:  Outpatient SLP;24 hour supervision/assistance  Rationale for SLP Follow Up: Maximize functional communication;Reduce caregiver burden;Maximize cognitive function and independence   Equipment:     Reasons for discharge: Discharged from hospital   Patient/Family Agrees with Progress Made and Goals Achieved: Yes   Function:  Eating Eating     Eating Assist Level: No help, No cues           Cognition Comprehension Comprehension assist level: Follows basic conversation/direction with extra time/assistive device  Expression   Expression assist level: Expresses complex 90% of the time/cues < 10% of the time  Social Interaction Social Interaction assist level: Interacts appropriately 90% of the time - Needs  monitoring or encouragement for participation or interaction.  Problem Solving Problem solving assist level: Solves basic 75 - 89% of the time/requires cueing 10 - 24% of the time  Memory Memory assist level: Recognizes or recalls 75 - 89% of the time/requires cueing 10 - 24% of the time   Vinetta Bergamo MA, CCC-SLP 04/06/2016, 4:44 PM

## 2016-04-06 NOTE — Patient Care Conference (Signed)
Inpatient RehabilitationTeam Conference and Plan of Care Update Date: 04/06/2016   Time: 10:25 AM    Patient Name: Cynthia [name]      Medical Record Number: [id number]  Date of Birth: [birth year] Sex: Female         Room/Bed: 4M08C/4M08C-02 Payor Info: Payor: [employer] MEDICARE / Plan: [employer] MEDICARE / Product Type: *No Product type* /    Admitting Diagnosis: Stroke  Admit Date/Time:  03/24/2016  6:20 PM Admission Comments: No comment available   Primary Diagnosis:  Left hemiparesis ([place]) Principal Problem: Left hemiparesis [facility])  Patient Active Problem List   Diagnosis Date Noted  . Executive function deficit   . Left hemiparesis ([place]) 03/29/2016  . 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 Vascular headache   . Benign essential HTN   . Hyponatremia   . Acute blood loss anemia   . TBI (traumatic brain injury) ([place])   . Intracerebral hemorrhage 03/20/2016  . ICH (intracerebral hemorrhage) ([place]) 03/20/2016  . Colon polyps   . Osteoporosis   . HTN (hypertension) 01/08/2013    Expected Discharge Date: Expected Discharge Date: 04/07/16  Team Members Present: Physician leading conference: Dr. Alysia Penna Social Worker Present: Ovidio Kin, LCSW Nurse Present: Heather Roberts, RN PT Present: Georjean Mode, PT OT Present: Clyda Greener, OT SLP Present: Weldon Inches, SLP PPS Coordinator present : Daiva Nakayama, RN, CRRN     Current Status/Progress Goal Weekly Team Focus  Medical   Left neglect, improving awareness of deficits, history of depression  Improve safety awareness awareness of left neglect.  Discharge planning   Bowel/Bladder   Continent of bowel/bladder. LBM 04/04/16. stress incontinent at times  patient to be continent of bowel/bladder while in [facility].  Monitor bowel/bladder function q shift and as needed.   Swallow/Nutrition/ Hydration             ADL's   supervision for bathing with min assist for UB dressing and supervision for LB dressing.  Supervision  for moblity.  Still with neglect of the LUE and left environment requring max cueing to compensate  supervision level overall  selfcare re-training, transfer training, balance re-training, pt/family education   Mobility   close supervision  supervision gait and transfers  family ed, d/c planning   Communication      mod I   increasing awareness   Safety/Cognition/ Behavioral Observations  mod cognitive impairment, mod A to min A for reasoning tasks  supervision   L attention, initiation, problem solving   Pain   Denied any pain or discomfort  <3  Assess and treat pain q shift and as needed   Skin   Skin intact  patient skin to be free from skin breakdwon/infection  Assess skin q shift and as needed      *See Care Plan and progress notes for long and short-term goals.  Barriers to Discharge: See above    Possible Resolutions to Barriers:  Continue rehabilitation, set up. Adequate supervision at home with follow-up therapies    Discharge Planning/Teaching Needs:  Home with husband who has been involved in therapies this week. Preparing for discharge tomorrow      Team Discussion:  Pt making good progress in therapies-still has left neglect but getting better. Husband has been in for family training. Improved awareness verbally but not functionally. Will do OP rehab.Medcially ready for DC tomorrow.  Revisions to Treatment Plan:  DC Tomorrow   Continued Need for Acute Rehabilitation Level of Care: The patient requires daily medical management by  a physician with specialized training in physical medicine and rehabilitation for the following conditions: Daily direction of a multidisciplinary physical rehabilitation program to ensure safe treatment while eliciting the highest outcome that is of practical value to the patient.: Yes Daily medical management of patient stability for increased activity during participation in an intensive rehabilitation regime.: Yes Daily analysis of  laboratory values and/or radiology reports with any subsequent need for medication adjustment of medical intervention for : Neurological problems  Maylen Waltermire, Gardiner Rhyme 04/06/2016, 1:27 PM

## 2016-04-06 NOTE — Progress Notes (Signed)
Physical Therapy Session Note  Patient Details  Name: Cynthia [name] MRN: [id number] Date of Birth: [birth year]  Today's Date: 04/06/2016 PT Individual Time: 1305-1330 PT Individual Time Calculation (min): 25 min    Short Term Goals: Week 2:  PT Short Term Goal 1 (Week 2): =LTG due to estimated LOS  Skilled Therapeutic Interventions/Progress Updates:    Pt seen for make up time. Pt received in w/c & agreeable to PT, denying c/o pain. Gait training x 175 ft room>gym without AD & supervision with cuing for LUE reciprocal arm swing & to attend to left. In gym, educated pt on falling at home & situations when she should call 9-1-1. Pt able to verbalize all scenarios back to therapist. Pt completed floor transfer with use of BUE to transfer with supervision, and completed transfer a 2nd time without using BUE support on mat table to transfer to standing & required Min A for balance. Gait training x 175 ft back to room with pt locating objects on L side with pt finding 50% of objects without cuing. Pt requires maximum cuing to turn head to left & scan entire field. At end of session pt left sitting in w/c with all needs within reach, husband present, and QRB in place.  Therapy Documentation Precautions:  Precautions Precautions: Fall Precaution Comments: marked left inattention Restrictions Weight Bearing Restrictions: No  Pain: Pain Assessment Pain Assessment: No/denies pain   See Function Navigator for Current Functional Status.   Therapy/Group: Individual Therapy  Waunita Schooner 04/06/2016, 5:23 PM

## 2016-04-06 NOTE — Progress Notes (Signed)
Occupational Therapy Discharge Summary  Patient Details  Name: Cynthia [name] MRN: [id number] Date of Birth: 1946-07-31  Today's Date: 04/06/2016 OT Individual Time: 3818-2993 OT Individual Time Calculation (min): 77 min   Session Note:  Family education completed for ADL session.  Pt overall supervision level for all bathing and dressing.  She was able to complete shower sit to stand but continues to need mod questioning cueing to gather all clothing needed and to complete shower in a timely manner.  If distracted with conversation she is not able to continue with task and needs cueing to remember what she is supposed to do.  Pt's husband present for session and educated on proper cueing to allow pt to problem solve.  Agreed that pt will need a tub shower seat and will have SW order.  Provided handout for FM and gross motor coordination in the LUE.  Encouraged environmental scanning at home as well as forced use of the LUE.  Pt left in wheelchair at end of session with husband present.   Patient has met 10 of 11 long term goals due to improved balance, postural control, ability to compensate for deficits, functional use of  LEFT upper extremity, improved attention, improved awareness and improved coordination.  Patient to discharge at overall Supervision level.  Patient's care partner is independent to provide the necessary cognitive assistance at discharge.    Reasons goals not met: Pt still needs cueing to locate objects in the left  Recommendation:  Patient will benefit from ongoing skilled OT services in outpatient setting to continue to advance functional skills in the area of BADL, iADL, Vocation and Reduce care partner burden.  Pt still demonstrates decreased selective attention as well as left neglect.  She continues to need max instructional cueing to scan left of midline in an environmental setting as well as mod cueing to incorporate the LUE into functional tasks.  Decreased  proprioception noted in the left elbow and hand with pt at times forgetting that she is holding onto something, needing visual attention to the hand to let go.  Recommend continued OT to further progress back to an independent level.    Equipment: tub seat  Reasons for discharge: treatment goals met and discharge from hospital  Patient/family agrees with progress made and goals achieved: Yes  OT Discharge Precautions/Restrictions Precautions Precautions: Fall Precaution Comments: marked left inattention Restrictions Weight Bearing Restrictions: No  Pain Pain Assessment Pain Assessment: No/denies pain ADL ADL ADL Comments: refer to functional navigator Vision/Perception  Vision- History Baseline Vision/History: Wears glasses Wears Glasses: Reading only;Distance only Patient Visual Report: No change from baseline Vision- Assessment Vision Assessment?: Yes Eye Alignment: Within Functional Limits Ocular Range of Motion: Within Functional Limits Alignment/Gaze Preference: Head turned;Gaze right Tracking/Visual Pursuits: Decreased smoothness of eye movement to LEFT superior field Saccades: Decreased speed of saccadic movement Convergence: Within functional limits Visual Fields: Left visual field deficit (Pt with left visual field deficit noted with functional tasks but needs further evaluation with perimter testing)  Cognition Overall Cognitive Status: Impaired/Different from baseline Arousal/Alertness: Awake/alert Orientation Level: Oriented X4 Attention: Selective Sustained Attention: Appears intact Selective Attention: Impaired Memory: Impaired Memory Impairment: Decreased recall of new information;Retrieval deficit Awareness: Impaired Awareness Impairment: Emergent impairment;Anticipatory impairment Problem Solving: Impaired Problem Solving Impairment: Verbal complex Executive Function: Initiating;Self Monitoring;Self Correcting;Organizing;Reasoning Reasoning:  Impaired Reasoning Impairment: Verbal complex Organizing: Impaired Organizing Impairment: Verbal complex Initiating: Impaired Initiating Impairment: Verbal complex Self Monitoring: Impaired Self Monitoring Impairment: Verbal complex Self Correcting: Impaired Self Correcting Impairment:  Verbal complex Safety/Judgment: Impaired Comments: Pt still with decreased selective attention during selfcare tasks.  Forgets to remove clothing from the left side at times after completing the right and will attempt to progress to another task without awareness of this.  Also demonstrates delayed processing to commands on occasion secondary to attention.  She can be walking in the hallway and be told to stop and she will continue walking with 2-3 second delay at times.   Sensation Sensation Light Touch: Appears Intact Stereognosis: Appears Intact Proprioception: Impaired Detail Proprioception Impaired Details: Impaired LUE Additional Comments: Pt with decreased proprioception in the left elbow with testing Coordination Gross Motor Movements are Fluid and Coordinated: No Fine Motor Movements are Fluid and Coordinated: No Coordination and Movement Description: Pt demonstrates slower FM and gross motor coordination with selfcare tasks such as tying shoes.  Motor  Motor Motor - Discharge Observations: mildly decreased strength LLE Mobility  Bed Mobility Rolling Right: 6: Modified independent (Device/Increase time) Rolling Left: 6: Modified independent (Device/Increase time) Supine to Sit: 6: Modified independent (Device/Increase time) Sit to Supine: 6: Modified independent (Device/Increase time) Transfers Transfers: Sit to Stand;Stand to Sit Sit to Stand: With upper extremity assist;From toilet;6: Modified independent (Device/Increase time) Stand to Sit: 6: Modified independent (Device/Increase time);With upper extremity assist;To chair/3-in-1;To toilet  Trunk/Postural Assessment  Cervical  Assessment Cervical Assessment: Exceptions to [facility] (slight right cervical rotation present) Thoracic Assessment Thoracic Assessment: Within Functional Limits Lumbar Assessment Lumbar Assessment: Within Functional Limits Postural Control Trunk Control: slight L lean in sitting  Balance Balance Balance Assessed: Yes Static Sitting Balance Static Sitting - Level of Assistance: 6: Modified independent (Device/Increase time) Dynamic Sitting Balance Dynamic Sitting - Level of Assistance: 6: Modified independent (Device/Increase time) Static Standing Balance Static Standing - Level of Assistance: 5: Stand by assistance Dynamic Standing Balance Dynamic Standing - Level of Assistance: 5: Stand by assistance Extremity/Trunk Assessment RUE Assessment RUE Assessment: Within Functional Limits LUE Assessment LUE Assessment: Exceptions to [facility] LUE Strength LUE Overall Strength:  (AROM WFLS for shoulder and hand.  Slight decreased full elbow extension with shoulder flexion secondary to propriceptive deficits but strength 4/5 througout.)   See Function Navigator for Current Functional Status.  Rakeem Colley  OTR/L 04/06/2016, 5:14 PM

## 2016-04-07 MED ORDER — ALPRAZOLAM 0.25 MG PO TABS: 0.2500 mg | ORAL_TABLET | Freq: Two times a day (BID) | ORAL | 0 refills | Status: DC | PRN

## 2016-04-07 MED ORDER — FAMOTIDINE 20 MG PO TABS: 20.0000 mg | ORAL_TABLET | Freq: Two times a day (BID) | ORAL | 0 refills | Status: DC

## 2016-04-07 MED ORDER — ALPRAZOLAM 0.25 MG PO TABS: ORAL_TABLET | ORAL | 0 refills | Status: DC

## 2016-04-07 NOTE — Progress Notes (Signed)
Patient and her husband discussed discharge instruction before they went home with Pam Love,PA.

## 2016-04-07 NOTE — Discharge Instructions (Signed)
Inpatient Rehab Discharge Instructions  Cynthia [name] Discharge date and time: 04/07/16   Activities/Precautions/ Functional Status: Activity: no lifting, driving, or strenuous exercise till cleared by MD. Diet: cardiac diet Wound Care: none needed   Functional status:  ___ No restrictions     ___ Walk up steps independently _X__ 24/7 supervision/assistance   ___ Walk up steps with assistance ___ Intermittent supervision/assistance  ___ Bathe/dress independently ___ Walk with walker     _x__ Bathe/dress with assistance ___ Walk Independently    ___ Shower independently _X__ Walk with supervision    ___ Shower with assistance _X__ No alcohol     ___ Return to work/school ________  Special Instructions: 1. No driving 2. Needs assistance with medication and to manage finances.    COMMUNITY REFERRALS UPON DISCHARGE:    Outpatient: PT, OT, SP  Agency:[facility] Phone:[phone]   Date of Last Service:04/07/2016  Appointment Date/Time:JULY 28-Friday 12:45-2:00 PM AUGUST 1 Tuesday @ 9:30-10:15 AM AND AUGUST 4 Friday 11:00-11:45 AM  Medical Equipment/Items Ordered:[facility] [phone]   GENERAL COMMUNITY RESOURCES FOR PATIENT/FAMILY: Support Groups:[facility] SUPPORT GROUP EVERY SECOND Thursday @ 3:00-4:00 PM ON THE REHAB UNIT QUESTIONS CONTACT KATIE  [id number]   STROKE/TIA DISCHARGE INSTRUCTIONS SMOKING Cigarette smoking nearly doubles your risk of having a stroke & is the single most alterable risk factor  If you smoke or have smoked in the last 12 months, you are advised to quit smoking for your health.  Most of the excess cardiovascular risk related to smoking disappears within a year of stopping.  Ask you doctor about anti-smoking medications  [place] Quit Line: 1-800-QUIT NOW  Free Smoking Cessation Classes [phone]  CHOLESTEROL Know your levels; limit fat & cholesterol in your diet  Lipid Panel     Component Value Date/Time   CHOL 181 03/21/2016 0554   TRIG 37 03/21/2016 0554   HDL 90 03/21/2016 0554   CHOLHDL 2.0 03/21/2016 0554   VLDL 7 03/21/2016 0554   LDLCALC 84 03/21/2016 0554      Many patients benefit from treatment even if their cholesterol is at goal.  Goal: Total Cholesterol (CHOL) less than 160  Goal:  Triglycerides (TRIG) less than 150  Goal:  HDL greater than 40  Goal:  LDL (LDLCALC) less than 100   BLOOD PRESSURE American Stroke Association blood pressure target is less that 120/80 mm/Hg  Your discharge blood pressure is:  BP: (!) 152/72  Monitor your blood pressure  Limit your salt and alcohol intake  Many individuals will require more than one medication for high blood pressure  DIABETES (A1c is a blood sugar average for last 3 months) Goal HGBA1c is under 7% (HBGA1c is blood sugar average for last 3 months)  Diabetes: No known diagnosis of diabetes    Lab Results  Component Value Date   HGBA1C 5.3 03/22/2016     Your HGBA1c can be lowered with medications, healthy diet, and exercise.  Check your blood sugar as directed by your physician  Call your physician if you experience unexplained or low blood sugars.  PHYSICAL ACTIVITY/REHABILITATION Goal is 30 minutes at least 4 days per week  Activity: Increase activity slowly, Therapies: Physical Therapy: Home Health Return to work: N/A  Activity decreases your risk of heart attack and stroke and makes your heart stronger.  It helps control your weight and blood pressure; helps you relax and can improve your mood.  Participate in a regular exercise program.  Talk with your doctor about  the best form of exercise for you (dancing, walking, swimming, cycling).  DIET/WEIGHT Goal is to maintain a healthy weight  Your discharge diet is: Diet regular Room service appropriate?: Yes; Fluid consistency:: Thin  liquids Your height is:  Height: 5\' 6"  (167.6 cm) Your current weight is: Weight: 66.5 kg (146 lb 9.6 oz) Your Body Mass Index  (BMI) is:  BMI (Calculated): 23.7  Following the type of diet specifically designed for you will help prevent another stroke.  You are at goal weight.    Your goal Body Mass Index (BMI) is 19-24.  Healthy food habits can help reduce 3 risk factors for stroke:  High cholesterol, hypertension, and excess weight.  RESOURCES Stroke/Support Group:  Call [phone]   STROKE EDUCATION PROVIDED/REVIEWED AND GIVEN TO PATIENT Stroke warning signs and symptoms How to activate emergency medical system (call 911). Medications prescribed at discharge. Need for follow-up after discharge. Personal risk factors for stroke. Pneumonia vaccine given:  Flu vaccine given:  My questions have been answered, the writing is legible, and I understand these instructions.  I will adhere to these goals & educational materials that have been provided to me after my discharge from the hospital.       My questions have been answered and I understand these instructions. I will adhere to these goals and the provided educational materials after my discharge from the hospital.  Patient/Caregiver Signature _______________________________ Date __________  Clinician Signature _______________________________________ Date __________  Please bring this form and your medication list with you to all your follow-up doctor's appointments.

## 2016-04-07 NOTE — Progress Notes (Signed)
Social Work  Discharge Note  The overall goal for the admission was met for:   Discharge location: [facility]  Length of Stay: Yes-14 DAYS  Discharge activity level: Yes-SUPERVISION LEVEL FOR CUEING  Home/community participation: Yes  Services provided included: MD, RD, PT, OT, SLP, RN, CM, TR, Pharmacy, Neuropsych and SW  Financial Services: Private Insurance: [facility]  Follow-up services arranged: Outpatient: [facility]-PT,OT,SP- 7/28 12;45-2:00, 8/1 9:30-10;15 AND 8/4 11:00-11;45 and DME: [facility]  Comments (or additional information):HUSBAND WAS HERE FOR FAMILY TRAINING AND FEELS COMFORTABLE WITH WIFE'S CARE.   Patient/Family verbalized understanding of follow-up arrangements: Yes  Individual responsible for coordination of the follow-up plan: SELF & DAVID-HUSBAND  Confirmed correct DME delivered: Elease Hashimoto 04/07/2016    Elease Hashimoto

## 2016-04-07 NOTE — Progress Notes (Signed)
Subjective/Complaints: Pt aware of d/c  Review of systems negative for chest pain, shortness of breath, nausea, vomiting, diarrhea or constipation. She has some urinary frequency, no burning with urination  Objective: Vital Signs: Blood pressure (!) 152/72, pulse 81, temperature 97.6 F (36.4 C), temperature source Oral, resp. rate 20, height 5\' 6"  (1.676 m), weight 66.5 kg (146 lb 9.6 oz), SpO2 98 %. No results found. No results found for this or any previous visit (from the past 72 hour(s)).   HEENT: normal,  Cardio: RRR and No murmurs Resp: CTA B/L and Unlabored GI: BS positive and Nontender, nondistended Extremity:  Pulses positive and No Edema Skin:   Intact Neuro: Flat, Abnormal Sensory Reduced left upper extremity. Light touch, Abnormal Motor 3+/5 in the left deltoid, biceps, triceps, grip, hip flexor, knee extensor, ankle dorsiflexor, plantar flexor, Abnormal FMC Ataxic/ dec FMC and Inattention Musc/Skel:  Other No pain with upper limb or lower limb.but exhibits left visual neglect.  Gen. no acute distress   Assessment/Plan: 1. Functional deficits secondary to right frontal lobe intracranial hemorrhage which require 3+ hours per day of interdisciplinary therapy in a comprehensive inpatient rehab setting. Physiatrist is providing close team supervision and 24 hour management of active medical problems listed below. Physiatrist and rehab team continue to assess barriers to discharge/monitor patient progress toward functional and medical goals. FIM: Function - Bathing Position: Shower Body parts bathed by patient: Right arm, Left arm, Chest, Abdomen, Front perineal area, Buttocks, Right upper leg, Left upper leg, Left lower leg, Right lower leg, Back Body parts bathed by helper: Back Bathing not applicable: Back Assist Level: Supervision or verbal cues  Function- Upper Body Dressing/Undressing What is the patient wearing?: Pull over shirt/dress Bra - Perfomed by patient:  Thread/unthread right bra strap, Thread/unthread left bra strap Bra - Perfomed by helper: Hook/unhook bra (pull down sports bra) Pull over shirt/dress - Perfomed by patient: Thread/unthread right sleeve, Put head through opening, Pull shirt over trunk, Thread/unthread left sleeve Pull over shirt/dress - Perfomed by helper: Thread/unthread left sleeve Assist Level: Supervision or verbal cues Set up : To obtain clothing/put away Function - Lower Body Dressing/Undressing What is the patient wearing?: Shoes, Socks, Pants, Underwear Position: Wheelchair/chair at sink Underwear - Performed by patient: Thread/unthread right underwear leg, Thread/unthread left underwear leg, Pull underwear up/down Underwear - Performed by helper: Pull underwear up/down Pants- Performed by patient: Thread/unthread right pants leg, Thread/unthread left pants leg, Pull pants up/down Pants- Performed by helper: Pull pants up/down Non-skid slipper socks- Performed by patient: Don/doff right sock, Don/doff left sock Non-skid slipper socks- Performed by helper: Don/doff right sock, Don/doff left sock Socks - Performed by patient: Don/doff right sock, Don/doff left sock Shoes - Performed by patient: Don/doff right shoe, Don/doff left shoe, Fasten right, Fasten left Shoes - Performed by helper: Don/doff right shoe, Don/doff left shoe, Fasten right, Fasten left Assist for footwear: Supervision/touching assist Assist for lower body dressing: Supervision or verbal cues  Function - Toileting Toileting steps completed by patient: Adjust clothing prior to toileting, Performs perineal hygiene, Adjust clothing after toileting Toileting steps completed by helper: Adjust clothing prior to toileting, Adjust clothing after toileting Toileting Assistive Devices: Grab bar or rail Assist level: Supervision or verbal cues  Function - Air cabin crew transfer assistive device: Elevated toilet seat/BSC over toilet, Grab bar Assist  level to toilet: Supervision or verbal cues Assist level from toilet: Supervision or verbal cues  Function - Chair/bed transfer Chair/bed transfer method: Ambulatory Chair/bed transfer assist level:  Supervision or verbal cues Chair/bed transfer assistive device: Armrests Chair/bed transfer details: Verbal cues for precautions/safety  Function - Locomotion: Wheelchair Will patient use wheelchair at discharge?: No Function - Locomotion: Ambulation Assistive device: No device Max distance: 175 ft Assist level: Supervision or verbal cues Assist level: Supervision or verbal cues Assist level: Supervision or verbal cues Walk 150 feet activity did not occur: Safety/medical concerns Assist level: Supervision or verbal cues Walk 10 feet on uneven surfaces activity did not occur: Safety/medical concerns Assist level: Supervision or verbal cues  Function - Comprehension Comprehension: Auditory Comprehension assist level: Follows complex conversation/direction with no assist  Function - Expression Expression: Verbal Expression assist level: Expresses complex ideas: With no assist  Function - Social Interaction Social Interaction assist level: Interacts appropriately with others - No medications needed.  Function - Problem Solving Problem solving assist level: Solves complex problems: Recognizes & self-corrects  Function - Memory Memory assist level: Complete Independence: No helper Patient normally able to recall (first 3 days only): Current season   Medical Problem List and Plan: 1. Left hemiplegia secondary to right frontal ICH,stable for d/c  2. DVT Prophylaxis/Anticoagulation: SCDs. Monitor for signs of DVT 3. Pain Management: Tylenol as needed, no current pain complaints 4. Mood: Xanax 0.25 mg twice daily as needed , 5. Neuropsych: This patient is capable of making decisions on her own behalf. 6. Skin/Wound Care: Routine skin checks 7. Fluids/Electrolytes/Nutrition: Routine  I&O's, persistent hypokalemia, will increase. KCl 30 mEq twice a day recheck  improved  BMP Latest Ref Rng & Units 03/30/2016 03/26/2016 03/25/2016  Glucose 65 - 99 mg/dL 141(H) 99 101(H)  BUN 6 - 20 mg/dL 12 9 <5(L)  Creatinine 0.44 - 1.00 mg/dL 0.68 0.64 0.61  Sodium 135 - 145 mmol/L 133(L) 139 139  Potassium 3.5 - 5.1 mmol/L 4.2 3.4(L) 2.9(L)  Chloride 101 - 111 mmol/L 100(L) 103 99(L)  CO2 22 - 32 mmol/L 27 29 29   Calcium 8.9 - 10.3 mg/dL 9.1 9.2 9.1     LOS (Days) 14 A FACE TO FACE EVALUATION WAS PERFORMED   KIRSTEINS,ANDREW E 04/07/2016, 7:37 AM

## 2016-04-08 ENCOUNTER — Ambulatory Visit: Payer: Medicare Other | Attending: Physical Medicine & Rehabilitation

## 2016-04-10 NOTE — Discharge Summary (Signed)
Discharge summary job # [id number]

## 2016-04-10 NOTE — Discharge Summary (Signed)
NAME:  Cynthia [name], Cynthia [name] NO.:  [id number]  MEDICAL RECORD NO.:  [id number]  LOCATION:  4M08C                        FACILITY:  [facility]  PHYSICIAN:  Charlett Blake, M.D.DATE OF BIRTH:  [birth year]  DATE OF ADMISSION:  03/24/2016 DATE OF DISCHARGE:  04/07/2016                              DISCHARGE SUMMARY   DISCHARGE DIAGNOSES: 1. Right frontal intracranial hemorrhage. 2. SCDs for DVT prophylaxis. 3. Pain management. 4. Anxiety. 5. Constipation, resolved.  HISTORY OF PRESENT ILLNESS:  This is a 70 year old right-handed female, admitted on March 20, 2016, with headache, left-sided weakness, altered mental status, and aphasia.  There was report of a recent fall from first step of a ladder several days prior.  She lives with her husband in [place], independent prior to admission.  CT of the head showed acute anterior right frontal lobe intra-axial hemorrhage, estimated hemorrhage volume 20 mL with surrounding edema, regional mass effect. MRI of the brain showed late acute-early subacute intraparenchymal hemorrhage in the right posterior frontal lobe with layering and surrounding edema.  CTA of head and neck showed right frontal lobe bilobed hematoma with surrounding vasogenic edema.  CT angiogram of head and neck showed no significant stenosis, maintained on a regular diet. The patient was admitted for a comprehensive rehab program.  PAST MEDICAL HISTORY:  See discharge diagnoses.  SOCIAL HISTORY:  Lives with spouse, independent prior to admission. Functional status upon admission to rehab services was moderate assist, ambulate 12 feet, 2 person handheld assistance, moderate assist stand pivot transfers, min to mod assist activities of daily living.  PHYSICAL EXAMINATION:  VITAL SIGNS:  Blood pressure 166/66, pulse 64, temperature 98, and respirations 14. GENERAL:  This was an alert female, oriented to person, place, and time. LUNGS:  Clear to auscultation.   No wheeze. CARDIAC:  Regular rate and rhythm.  No murmur. ABDOMEN:  Soft, nontender.  Good bowel sounds.  Noted right gaze preference.  REHABILITATION HOSPITAL COURSE:  The patient was admitted to inpatient rehab services with therapies initiated on a 3-hour daily basis, consisting of physical therapy, occupational therapy, speech therapy, and rehabilitation nursing.  The following issues were addressed during the patient's rehabilitation stay.  Pertaining to Ms. Rash right frontal ICH remained stable, she would follow up outpatient Neurology Services.  Blood pressures controlled and monitored.  She was using Xanax on a limited basis for anxiety.  Bouts of constipation resolved with laxative assistance.  The patient received weekly collaborative interdisciplinary team conferences to discuss estimated length of stay, family teaching, any barriers to her discharge.  She was ambulating 175 feet to and from the gym without assistive device, supervision for cuing.  She was able to communicate her needs.  Completed floor transfers, bilateral upper extremities to transfer with supervision, gather belongings for activities of daily living and homemaking.  She verbalized her needs, reiterated the importance of 24-hour supervision for safety with patient's family.  The patient able to recall medications 100% accurate, modified independence.  She was discharged home.  DISCHARGE MEDICATIONS:  At time of dictation included; 1. Xanax 0.25 mg p.o. b.i.d. as needed. 2. Zyrtec 10 mg daily as needed. 3. Pepcid 20 mg p.o. b.i.d. 4. Ferrous sulfate 325 mg p.o.  daily as needed. 5. Os-Cal daily.  DIET:  Regular.  FOLLOWUP:  The patient would follow up with Dr. Alysia Penna at the outpatient rehab center as directed; Dr. Antony Contras, Neurology Services 1 month call for appointment; Dr. Mady Haagensen, Physical Therapy; and Dr. Jenna Luo, Medical Management.     Lauraine Rinne,  P.A.   ______________________________ Charlett Blake, M.D.    DA/MEDQ  D:  04/10/2016  T:  04/10/2016  Job:  [id number]  cc:   Charlett Blake, M.D.

## 2016-04-11 ENCOUNTER — Telehealth: Payer: Self-pay

## 2016-04-11 NOTE — Telephone Encounter (Signed)
1. Are you/is patient experiencing any problems since coming home? Are there any questions regarding any aspect of care? No issues.  2. Are there any questions regarding medications administration/dosing? Are meds being taken as prescribed? Patient should review meds with caller to confirm. Meds confirmed.  3. Have there been any falls? No falls.  4. Has Home Health been to the house and/or have they contacted you? If not, have you tried to contact them? Can we help you contact them? Outpatient PT is set up.  5. Are bowels and bladder emptying properly? Are there any unexpected incontinence issues? If applicable, is patient following bowel/bladder programs? No issues. 6. Any fevers, problems with breathing, unexpected pain? No issues.  7. Are there any skin problems or new areas of breakdown? No issues.  8. Has the patient/family member arranged specialty MD follow up (ie cardiology/neurology/renal/surgical/etc)?  Can we help arrange? Has made follow up appointments.  9. Does the patient need any other services or support that we can help arrange? Denies services needed. 10. Are caregivers following through as expected in assisting the patient? Yes 11.Has the patient quit smoking, drinking alcohol, or using drugs as recommended? Pt is not smoking,      drinking alcohol, or using drugs.   Spoke with pt, she is aware of appointment with [facility] on 04/19/16 @ 3:00 pm. Pt packet will be mailed.

## 2016-04-12 ENCOUNTER — Ambulatory Visit: Payer: Medicare Other | Admitting: Physical Therapy

## 2016-04-12 ENCOUNTER — Ambulatory Visit: Payer: Medicare Other | Attending: Physical Medicine & Rehabilitation | Admitting: Occupational Therapy

## 2016-04-12 ENCOUNTER — Encounter: Payer: Self-pay | Admitting: Occupational Therapy

## 2016-04-12 DIAGNOSIS — R2689 Other abnormalities of gait and mobility: Secondary | ICD-10-CM | POA: Insufficient documentation

## 2016-04-12 DIAGNOSIS — G8194 Hemiplegia, unspecified affecting left nondominant side: Secondary | ICD-10-CM | POA: Diagnosis present

## 2016-04-12 DIAGNOSIS — R41841 Cognitive communication deficit: Secondary | ICD-10-CM | POA: Diagnosis present

## 2016-04-12 DIAGNOSIS — R41844 Frontal lobe and executive function deficit: Secondary | ICD-10-CM | POA: Insufficient documentation

## 2016-04-12 DIAGNOSIS — M6281 Muscle weakness (generalized): Secondary | ICD-10-CM | POA: Insufficient documentation

## 2016-04-12 DIAGNOSIS — R278 Other lack of coordination: Secondary | ICD-10-CM | POA: Insufficient documentation

## 2016-04-12 DIAGNOSIS — R41842 Visuospatial deficit: Secondary | ICD-10-CM | POA: Insufficient documentation

## 2016-04-12 NOTE — Therapy (Signed)
[facility] [address] [place], [place], [address] Phone: [phone]   Fax:  [phone]  Occupational Therapy Evaluation  Patient Details  Name: Cynthia [name] MRN: [id number] Date of Birth: [birth year] Referring Provider: Dr. Alysia Penna  Encounter Date: 04/12/2016      OT End of Session - 04/12/16 1049    Visit Number 1   Number of Visits 16   Date for OT Re-Evaluation 06/07/16   Authorization Type [employer] medicare - will need g code and PN every 10 visits   Authorization Time Period 60 days   Authorization - Visit Number 1   Authorization - Number of Visits 10   OT Start Time 0932   OT Stop Time 1020   OT Time Calculation (min) 48 min      Past Medical History:  Diagnosis Date  . Colon polyps   . Depression    history of  . Osteoporosis     History reviewed. No pertinent surgical history.  There were no vitals filed for this visit.      Subjective Assessment - 04/12/16 0933    Pertinent History see epic snapshot;  R frontal ICH possibly due to fall   Patient Stated Goals I am hoping to be able to get back to work in the fall   Currently in Pain? Yes   Pain Score 3    Pain Location Back   Pain Orientation Lower   Pain Descriptors / Indicators Aching   Pain Type Chronic pain   Pain Onset Other (comment)  off and on for years.   Pain Frequency Intermittent   Aggravating Factors  lifting, bending, general housework   Pain Relieving Factors rest, stretching, yoga, meds if it is really bad           [facility] OT Assessment - 04/12/16 0001      Assessment   Diagnosis R frontal ICH   Referring Provider Dr. Alysia Penna   Onset Date 03/20/16   Prior Therapy Pt had inpt rehab PT, OT and ST     Precautions   Precautions Fall   Precaution Comments Pt fell of a ladder (second step) - denies hitting head.      Restrictions   Weight Bearing Restrictions Yes   Other Position/Activity  Restrictions Pt reports inpt MD told her no heaving lifting, no driving     Balance Screen   Has the patient fallen in the past 6 months Yes  PT eval today   How many times? [place] expects to be discharged to: Private residence   Living Arrangements Spouse/significant other   Available Help at Discharge Available 24 hours/day   Type of [place] Multi-level   Bathroom Shower/Tub Tub/Shower unit   Bathroom Toilet Standard   Additional Comments shower seat, grab bar in shower.       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   Leisure read, Training and development officer     ADL   Eating/Feeding Independent   Grooming Independent   Upper Body Bathing Supervision/safety   Lower Body Bathing Supervision/safety   Upper Body Dressing Supervision/safety   Lower Body Dressing Supervision/safety   Materials engineer Supervision/safety   Toileting - Public librarian  Toileting -  Development worker, community Supervision/safety   ADL comments Pt reports that husband has to cue her at times when she is dressing "what do you need to do next, what did you forget to do?"     IADL   Shopping Needs to be accompanied on any shopping trip   Light Housekeeping Performs light daily tasks such as dishwashing, bed making   Meal Prep Needs to have meals prepared and served  pt did not cook before but made sandwiches etc before   [employer] on family or friends for transportation   Medication Management Has difficulty remembering to take medication  husband comes behind pt after she sets up pill box.     Financial Management Requires assistance     Mobility   Mobility Status Needs assist   Mobility Status Comments  supervision in the home and in the community     Written Expression   Dominant Hand Right   Handwriting 100% legible  name in cursive pt reports handwriting looks the same     Vision - History   Baseline Vision Wears glasses only for reading  always wears distance when driving at night   Additional Comments Pt reports "I don't always see things on the left"     Vision Assessment   Eye Alignment Impaired (comment)  mild   Ocular Range of Motion Within Functional Limits   Tracking/Visual Pursuits --  pt  "gets ahead" of target when trying to track   Saccades Within functional limits   Diplopia Assessment Objects split on top of one another  at approximately 6 feet     Cognition   Overall Cognitive Status Impaired/Different from baseline   [place]  MOCA=26;  delayed processing, working memory, disorganization   Attention Alternating   Alternating Attention Impaired   Memory Impaired  working memory   Awareness Impaired   Awareness Impairment Emergent impairment   Corporate treasurer;Sequencing;Organizing;Decision Making;Self Monitoring;Self Correcting   Sequencing Impaired   Organizing Impaired   Decision Making Impaired   Self Monitoring Impaired   Self Correcting Impaired     Sensation   Light Touch Appears Intact   Hot/Cold Appears Intact   Proprioception Appears Intact   Additional Comments For UE's     Coordination   Gross Motor Movements are Fluid and Coordinated Yes   Fine Motor Movements are Fluid and Coordinated No   Finger Nose Finger Test mild impairment   9 Hole Peg Test --  to be assessed at next session     Perception   Perception Impaired  L spatial neglect     Tone   Assessment Location Right Upper Extremity     ROM / Strength   AROM / PROM / Strength AROM;Strength     AROM   Overall AROM  Within functional limits for tasks performed   Overall AROM Comments BUE"s     Strength   Overall Strength Within functional  limits for tasks performed   Overall Strength Comments for UE"s except R grip strength     Hand Function   Right Hand Gross Grasp Functional   Right Hand Grip (lbs) 65   Left Hand Gross Grasp Impaired   Left Hand Grip (lbs) 45     RUE Tone   RUE Tone Mild;Hypertonic  OT Short Term Goals - 04/12/16 1033      OT SHORT TERM GOAL #1   Title Pt and husband will be mod I with home activities program- 05/10/2016   Status New     OT SHORT TERM GOAL #2   Title Pt will demonstrate improved grip strength by at least 5 pounds to assist with functional tasks (baseline=45)   Status New     OT SHORT TERM GOAL #3   Title Assess 9 hole peg and set goal as indicated   Status New     OT SHORT TERM GOAL #4   Title Pt will require no more than min questioning cues for moderately complex problem solving functional tasks   Status New     OT SHORT TERM GOAL #5   Title Pt will require no more than min a for alternating attention with familiar, moderately complex functional tasks   Status New     Additional Short Term Goals   Additional Short Term Goals Yes     OT SHORT TERM GOAL #6   Title Pt will be mod I with showering   Status New     OT SHORT TERM GOAL #7   Title Pt will be mod I with dressing   Status New     OT SHORT TERM GOAL #8   Title Pt will be mod I with toilet and shower transfers   Status New           OT Long Term Goals - 04/12/16 1037      OT LONG TERM GOAL #1   Title Pt and husband will be mod I with home activities program - 06/07/2016   Status New     OT LONG TERM GOAL #2   Title Pt wil demonstrate at least 7 pound increase in grip strength to assist with functional tasks (baseline=45)   Status New     OT LONG TERM GOAL #3   Title Pt will be mod I with simple familar hot meal prep   Status New     OT LONG TERM GOAL #5   Title Pt will require no more than 2 cues for moderately complext problem solving functional  tasks   Status New     Long Term Additional Goals   Additional Long Term Goals Yes     OT LONG TERM GOAL #6   Title Pt will be mod I with alternating attention for moderately complext task requiring alternating attention   Status New               Plan - 04/12/16 1042    Clinical Impression Statement Pt is a 70 year old female s/p R frontal ICH on 03/20/2016.  Pt was discharged home on 04/07/2016 after brief inpt rehab stay.  PMH: HTN, osteoporosis, low back pain.  Pt presents today with the following deficts that impact ADL's, IADL's, return to work and return to leisure:  R dominant hemiplegia, mild hypertonicity RUE, impaired coordination RUE, decreased grip strength RUE, diplopia, L neglect, decreased activity tolerance, impaired high level balance, impaired cognition including impaired attention, problem solving, sequencing, organization, working memory, awareness, delayed processing,.  Pt will benefit from skilled OT to address these deficits and maximize independence.    Rehab Potential Good   OT Frequency 2x / week   OT Duration 8 weeks   OT Treatment/Interventions Self-care/ADL training;DME and/or AE instruction;Neuromuscular education;Therapeutic exercise;Therapist, nutritional;Therapeutic activities;Balance training;Patient/family education;Visual/perceptual remediation/compensation;Cognitive remediation/compensation   Plan assess 9  hole peg and set goal prn, initiate HEP for grip strength/coordination   Consulted and Agree with Plan of Care Patient      Patient will benefit from skilled therapeutic intervention in order to improve the following deficits and impairments:  Decreased activity tolerance, Decreased balance, Decreased coordination, Decreased cognition, Decreased strength, Impaired UE functional use, Impaired tone, Impaired vision/preception, Pain  Visit Diagnosis: Frontal lobe and executive function deficit - Plan: Ot plan of care cert/re-cert  Hemiplegia,  unspecified affecting left nondominant side ([place]) - Plan: Ot plan of care cert/re-cert  Visuospatial deficit - Plan: Ot plan of care cert/re-cert  Other lack of coordination - Plan: Ot plan of care cert/re-cert      G-Codes - [id number]    Functional Assessment Tool Used 9 hole peg, dynamometer, skilled clinical observation   Functional Limitation Self care   Self Care Current Status [id number]) At least 60 percent but less than 80 percent impaired, limited or restricted   Self Care Goal Status [id number]) At least 20 percent but less than 40 percent impaired, limited or restricted      Problem List Patient Active Problem List   Diagnosis Date Noted  . Executive function deficit   . Left hemiparesis ([place]) 03/29/2016  . 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 Vascular headache   . Benign essential HTN   . Hyponatremia   . Acute blood loss anemia   . TBI (traumatic brain injury) ([place])   . Intracerebral hemorrhage 03/20/2016  . ICH (intracerebral hemorrhage) ([place]) 03/20/2016  . Colon polyps   . Osteoporosis   . HTN (hypertension) 01/08/2013    Quay Burow, OTR/L 04/12/2016, 10:54 AM  [facility] [address] [place], [place], [address] Phone: [phone]   Fax:  [phone]  Name: Cynthia [name] MRN: [id number] Date of Birth: 1945/11/11

## 2016-04-13 ENCOUNTER — Encounter: Payer: Self-pay | Admitting: Physical Therapy

## 2016-04-13 NOTE — Therapy (Signed)
[facility] [address] [place], [place], [address] Phone: [phone]   Fax:  [phone]  Physical Therapy Evaluation  Patient Details  Name: Cynthia [name] MRN: [id number] Date of Birth: [birth year] Referring Provider: Dr. Alysia Penna   Encounter Date: 04/12/2016      PT End of Session - 04/13/16 1748    Visit Number 1   Number of Visits 8   Date for PT Re-Evaluation 05/13/16   Authorization Type [employer] Medicare   Authorization Time Period 04-12-16 - 06-11-16   PT Start Time 1017   PT Stop Time 1100   PT Time Calculation (min) 43 min      Past Medical History:  Diagnosis Date  . Colon polyps   . Depression    history of  . Osteoporosis     History reviewed. No pertinent surgical history.  There were no vitals filed for this visit.       Subjective Assessment - 04/13/16 1734    Subjective Pt reports she had CVA (ICH) on 03-19-16: she was admitted to inpatient rehab on 7-13 until 7-27 at which time she was discharged home; pt presents with high level balance and gait deficits and decr. endurance   Pertinent History HTN:  osteoporosis: TBI - fell off ladder in March 2017 and hit head   Patient Stated Goals "build strength and endurance back up"   Currently in Pain? Yes   Pain Score 3    Pain Location Back   Pain Orientation Lower   Pain Descriptors / Indicators Aching   Pain Type Chronic pain   Pain Onset More than a month ago   Pain Frequency Intermittent            [facility] PT Assessment - 04/13/16 0001      Assessment   Medical Diagnosis R frontal ICH   Referring Provider Dr. 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 LE's     Ambulation/Gait   Ambulation/Gait Yes   Ambulation/Gait Assistance 6: Modified independent (Device/Increase time)   Ambulation Distance (Feet) 120 Feet   Assistive device None   Gait Pattern Within Functional Limits   Ambulation Surface Level;Indoor   Gait velocity 3.18  10.32     Timed Up and Go Test   Normal TUG (seconds) 10.37  PT Long Term Goals - 04/13/16 1810      PT LONG TERM GOAL #1   Title Complete DGI and establish goal as appropriate.  (05-13-16)   Time 4   Period Weeks   Status New     PT LONG TERM GOAL #2   Title Increase gait velocity to >/= 3.6 ft/sec without device for incr. gait efficiency.  (05-13-16)   Baseline 3.18 ft/sec    Time 4   Period Weeks   Status New     PT LONG TERM GOAL #3   Title Negotiate 12 steps (to simulate flight) using step over step sequence to demo improved balance.  (05-13-16)   Time 4   Period Weeks   Status New     PT LONG TERM GOAL #4   Title Pt will report ability to walk her dog without experiencing LOB.  (05-13-16)   Time 4   Period Weeks   Status New     PT LONG TERM GOAL #5   Title Pt will amb. 10" nonstop on even/uneven surface with S without c/o fatigue to demo increased endurance.  (05-13-16)   Time 4   Period Weeks   Status New     Additional Long Term Goals   Additional Long Term  Goals Yes     PT LONG TERM GOAL #6   Title Independent in HEP for endurance and high level balance.  (05-13-16)   Time 4   Period Weeks   Status New               Plan - 04/13/16 1749    Clinical Impression Statement Pt is a 70 year old lady s/p R frontal ICH on 03-19-16.  Pt presents with high level balance and gait deficits and c/o's decreased endurance and activity tolerance.  PMH includes TBI in March 2017 in which she fell off of a ladder and hit her head, HTN and osteoporosis.     Rehab Potential Good   PT Frequency 2x / week   PT Duration 4 weeks   PT Treatment/Interventions ADLs/Self Care Home Management;Gait training;Stair training;Therapeutic activities;Therapeutic exercise;Balance training;Neuromuscular re-education;Patient/family education;Vestibular   PT Next Visit Plan do DGI, HEP for high level balance and strengthening:  endurance training   PT Home Exercise Plan high level balance, walking program   Recommended Other Services pt is receiving OT and ST services   Consulted and Agree with Plan of Care Patient      Patient will benefit from skilled therapeutic intervention in order to improve the following deficits and impairments:  Difficulty walking, Decreased balance, Decreased activity tolerance, Decreased endurance, Decreased strength  Visit Diagnosis: Other abnormalities of gait and mobility - Plan: PT plan of care cert/re-cert  Muscle weakness (generalized) - Plan: PT plan of care cert/re-cert      G-Codes - [id number]    Functional Assessment Tool Used Gait velocity 3.18 ft/sec; TUG score 10.37 secs without device   Functional Limitation Mobility: Walking and moving around   Mobility: Walking and Moving Around Current Status [id number]) At least 20 percent but less than 40 percent impaired, limited or restricted   Mobility: Walking and Moving Around Goal Status [phone]) At least 1 percent but less than 20 percent impaired, limited or restricted        Problem List Patient Active Problem List   Diagnosis Date Noted  . Executive function deficit   . Left hemiparesis ([place]) 03/29/2016  . 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 Vascular  headache   . Benign essential HTN   . Hyponatremia   . Acute blood loss anemia   . TBI (traumatic brain injury) ([place])   . Intracerebral hemorrhage 03/20/2016  . ICH (intracerebral hemorrhage) ([place]) 03/20/2016  . Colon polyps   . Osteoporosis   . HTN (hypertension) 01/08/2013    Ashleyanne Hemmingway, [place], PT 04/13/2016, 6:25 PM  [facility] [address] [place], [place], [address] Phone: [phone]   Fax:  [phone]  Name: Cynthia [name] MRN: [id number] Date of Birth: [birth year]

## 2016-04-14 ENCOUNTER — Ambulatory Visit (INDEPENDENT_AMBULATORY_CARE_PROVIDER_SITE_OTHER): Payer: Medicare Other | Admitting: Family Medicine

## 2016-04-14 VITALS — BP 180/106 | HR 100 | Temp 98.4°F | Wt 143.0 lb

## 2016-04-14 DIAGNOSIS — Z09 Encounter for follow-up examination after completed treatment for conditions other than malignant neoplasm: Secondary | ICD-10-CM

## 2016-04-14 DIAGNOSIS — I629 Nontraumatic intracranial hemorrhage, unspecified: Secondary | ICD-10-CM

## 2016-04-14 NOTE — Progress Notes (Signed)
Subjective:    Patient ID: Cynthia [name], female    DOB: 1946/01/18, 70 y.o.   MRN: [id number]  HPI Patient was admitted to the hospital July 9 with difficulty finding words and neglect of her left leg and some left-sided weakness. CT scan revealed a large right frontal intracranial hemorrhage. I have copied relevant portions of the discharge summary and included them below for my reference: Admit date: 03/20/2016 Discharge date: 03/24/2016  Admission Diagnoses: Headache and word finding difficulty  Discharge Diagnoses: Right frontal lobar intracerebral hemorrhage of indeterminate etiology. Mild left hemiparesis and speech difficulties Active Problems:   Intracerebral hemorrhage   ICH (intracerebral hemorrhage) (HCC)   Vascular headache   Benign essential HTN   Hyponatremia   Acute blood loss anemia   TBI (traumatic brain injury) ([place]) Mild cytotoxic edema  Discharged Condition: good  Hospital Course: 70 year old right-handed woman who presented to the [facility] Emergency department after being sent from her doctor's office due to concern for possible stroke.The patient  reported that the night of 03/19/2016 (LKW, time unknown) she developed a sense of feeling disoriented and having difficulty speaking. When asked about her speech, she described that she had trouble focusing her thoughts and getting her words out. She reported that her sister also noticed that she seemed slow to respond to questions. She felt as if her speech was somewhat slurred as well. She did not endorse any weakness, numbness, vision changes, difficulty swallowing, or balance impairments. However, she had noticed that she will find her left arm in unusual postures and positions without being aware that it is doing anything. When she looked at it, she was able to control it without a problem and again denied any weakness or sensory changes. She  also had a headache for the past 2 days.  She denies any similar  previous episodes. She denied any recent trauma to the head. She did note that she was working with her brother at her home and had been up on a ladder. She was getting off the ladder, she thought she was on the ground but instead was on the second step of the latter when she stumbled backwards. She says that she struck her bottom on a nearby chair, then fell to the floor landing on her bottom. She did not strike her head. She has not had any recent illness. She does not use daily antiplatelet therapy or anticoagulation. She uses occasional Advil for headache and pain and has taken a total of six 200 mg tablets since last night. She also reports that she took a baby aspirin earlier today as recommended by her sister because of her symptoms.CT brain on admission showed a large right frontal ICH. Patient was not administered IV t-PA secondary to [place]. She was admitted to the neuro ICU for further evaluation and treatment. Her blood pressure was tightly controlled initially with IV drip and subsequently with oral medications. Follow-up CT scan showed stable appearance of the hemorrhage. There was mild cytotoxic edema but no hydrocephalus or any hemorrhage expansion. CT angiogram and CT venogram did not show any aneurysms or venous sinus thrombosis but there are no engorged vessels in the region of the hemorrhage raising concern about a small AVM hence cerebral catheter angiogram was performed and findings of which are also indeterminate and showed abnormal right frontopolar branches .She had persistent left-sided weakness but her speech and word finding difficulties improved. She was transferred to the neurology floor in a condition remained stable. She was  seen by physical occupational therapist and felt to be a good candidate for inpatient rehabilitation. She was accepted for transfer to rehabilitation in stable condition.   Consults: rehabilitation medicine and ointerventional radiology  Significant  Diagnostic Studies:   CT R frontal lobe ICH, 20 mL, possible trace SDH  MRI R posterior frontal lobe ICH, 48-3 days old. No other blood products seen.  CTA head and neck No avm/aneurysm  CT venogram no sinus thrombosis  Cerebral angio No definite AVM or aneurysm noted but abnormal right frontopolar branches raises the question of possible small AVM  LDL 84  HgbA1c  5.3  She is here today for follow-up. She is doing remarkably well. She is walking without difficulty. She is in physical therapy. At the present time she is having no difficulty speaking. She denies any difficulty with short-term or long-term memory. She denies any personality changes. Her biggest concern is what caused this and how to prevent it in the future. She was still taking meloxicam. She is on no other agent that within her blood. Her blood pressure today was extremely elevated on intake. However after letting the patient sit comfortably for a few minutes, her blood pressure fell to 148/90. Past Medical History:  Diagnosis Date  . Colon polyps   . Depression    history of  . Osteoporosis    No past surgical history on file. Current Outpatient Prescriptions on File Prior to Visit  Medication Sig Dispense Refill  . ALPRAZolam (XANAX) 0.25 MG tablet TAKE 1 TABLET 2 TIMES DAILY AS NEEDED FOR ANXIETY 90 tablet 0  . Calcium Carbonate-Vitamin D (OS-CAL 500 + D PO) Take 1 tablet by mouth daily.     . cetirizine (ZYRTEC) 10 MG tablet Take 10 mg by mouth daily as needed for allergies.     Marland Kitchen EPIPEN 2-PAK 0.3 MG/0.3ML SOAJ injection INJECT 0.3 MLS (0.3 MG TOTAL) INTO THE MUSCLE ONCE. 2 Device 0  . famotidine (PEPCID) 20 MG tablet Take 1 tablet (20 mg total) by mouth 2 (two) times daily. 60 tablet 0  . ferrous sulfate 325 (65 FE) MG tablet Take 325 mg by mouth daily as needed (to improve hemoglobin).     . meloxicam (MOBIC) 15 MG tablet Take 15 mg by mouth.     No current facility-administered medications on file prior to  visit.    Allergies  Allergen Reactions  . Other Anaphylaxis    FIRE ANTS  . Penicillins Rash    Has patient had a PCN reaction causing immediate rash, facial/tongue/throat swelling, SOB or lightheadedness with hypotension: Unknown Has patient had a PCN reaction causing severe rash involving mucus membranes or skin necrosis: No Has patient had a PCN reaction that required hospitalization No Has patient had a PCN reaction occurring within the last 10 years: No If all of the above answers are "NO", then may proceed with Cephalosporin use.    Social History   Social History  . Marital status: Married    Spouse name: N/A  . Number of children: N/A  . Years of education: N/A   Occupational History  . Not on file.   Social History Main Topics  . Smoking status: Never Smoker  . Smokeless tobacco: Never Used  . Alcohol use No     Comment: 1-2 beers/wine in evening  . Drug use: No  . Sexual activity: Not on file     Comment: Married since 2005   Other Topics Concern  . Not on file  Social History Narrative  . No narrative on file     Review of Systems  All other systems reviewed and are negative.      Objective:   Physical Exam  Constitutional: She is oriented to person, place, and time.  Cardiovascular: Normal rate, regular rhythm and normal heart sounds.   Pulmonary/Chest: Effort normal and breath sounds normal. No respiratory distress. She has no wheezes. She has no rales.  Abdominal: Soft. Bowel sounds are normal. She exhibits no distension. There is no tenderness. There is no rebound.  Neurological: She is alert and oriented to person, place, and time. She has normal reflexes. She displays normal reflexes. No cranial nerve deficit. She exhibits normal muscle tone. Coordination normal.  Psychiatric: She has a normal mood and affect. Her behavior is normal. Judgment and thought content normal.  Vitals reviewed.         Assessment & Plan:  Hospital discharge  follow-up  Hemorrhage, intracranial ([place])  At the time of her initial stroke, angiogram revealed no specific cause for the intracranial hemorrhage. Neurology has recommended repeating the test in 2 months. Patient is scheduled to follow with a neurologist in one month. In the meantime I want her to avoid any agents that can increase her risk of bleeding. I recommended that she stay away from meloxicam. She can use Tylenol if necessary for pain. I recommended avoiding I loosely aspirin or any other agent such as fish oil. Mother primary concern is her blood pressure. Even after calming down, her blood pressure is elevated today. She will check her blood pressure frequently over the weekend and call me with the values on Monday. Her goal blood pressures less than 130/80. If above this, I will start the patient on an angiotensin receptor blocker.

## 2016-04-15 ENCOUNTER — Ambulatory Visit: Payer: Medicare Other

## 2016-04-15 DIAGNOSIS — R41841 Cognitive communication deficit: Secondary | ICD-10-CM

## 2016-04-15 DIAGNOSIS — R41844 Frontal lobe and executive function deficit: Secondary | ICD-10-CM | POA: Diagnosis not present

## 2016-04-15 NOTE — Patient Instructions (Signed)
Work on listening to news stories of 5-7 minutes in length and taking notes, then reviewing and see how successful you were at taking detailed notes.

## 2016-04-15 NOTE — Therapy (Addendum)
[facility] [address] [place], [place], [address] Phone: [phone]   Fax:  [phone]  Speech Language Pathology Evaluation  Patient Details  Name: Cynthia [name] MRN: [id number] Date of Birth: [birth year] Referring Provider: Alysia Penna  Encounter Date: 04/15/2016      End of Session - 04/15/16 1245    Visit Number 1   Number of Visits 24   Date for SLP Re-Evaluation 07/08/16   SLP Start Time 1150   SLP Stop Time  1231   SLP Time Calculation (min) 41 min   Activity Tolerance Patient limited by lethargy      Past Medical History:  Diagnosis Date  . Colon polyps   . Depression    history of  . Osteoporosis     No past surgical history on file.  There were no vitals filed for this visit.      Subjective Assessment - 04/15/16 1113    Subjective "EVen though I'm in a familiar environment, I will put things down and can't remember where I put them."   Currently in Pain? No/denies            SLP Evaluation [facility] - 04/15/16 1113      SLP Visit Information   SLP Received On 04/15/16   Referring Provider Alysia Penna   Onset Date 03-19-16   Medical Diagnosis Rt frontal ICH     Prior Functional Status   Cognitive/Linguistic Baseline Within functional limits   Type of Home House    Lives With Spouse   Vocation Part time employment  reading assessments/tutor     Cognition   Overall Cognitive Status Impaired/Different from baseline   Area of Impairment Following commands;Attention   Current Attention Level Selective   Attention Comments Working memory seen as deifict area as pt was unable to hold directions pertaining to a task in her head to perform the task correctly    Following Commands Follows multi-step commands with increased time;Follows multi-step commands inconsistently   Following Command Comments Aud comprehension/processing is preserved for 4-5 unit two step commands. Pt  reports "getting lost" if longer verbal stimuli at times. Pt showed decr'd working memory on the MoCA, and this is suspected to cause deficits in following more complex commands.   Attention --  working memory   Alternating Attention Impaired   Alternating Attention Impairment Verbal complex   Behaviors Other (comment)  pt reported anxiousness with more difficult tasks     Auditory Comprehension   Overall Auditory Comprehension Impaired   Commands Impaired   Complex Commands Not tested  suspected due to decr'd working memory&delayed responses     Verbal Expression   Overall Verbal Expression Appears within functional limits for tasks assessed     Oral Motor/Sensory Function   Overall Oral Motor/Sensory Function Appears within functional limits for tasks assessed     Motor Speech   Overall Motor Speech Appears within functional limits for tasks assessed     Standardized Assessments   Standardized Assessments  Other Assessment  [place]kins Verbal Learning   Other Assessment Recall and Recognition subtests were all WNL                         SLP Education - 04/15/16 1245    Education provided Yes   Education Details ST therapy course, deficit areas   Person(s) Educated Patient   Methods Explanation   Comprehension Verbalized understanding  SLP Short Term Goals - 04/15/16 1248      SLP SHORT TERM GOAL #1   Title pt will alternate attention between or within two mod complex linguistic tasks with 85% success over three sessions   Time 4   Status New     SLP SHORT TERM GOAL #2   Title pt will follow multi-unit multistep commands with modified independence PRN (requesting repeats, etc.)   Time 4   Period Weeks   Status New          SLP Long Term Goals - 04/15/16 1252      SLP LONG TERM GOAL #1   Title pt will demo improved performance on the Attention Processing Test Exercise X, when given 1-2 second delays between stimuli   Time 8   Period  Weeks   Status New     SLP LONG TERM GOAL #2   Title pt will demo divided attention between two mod complex linguistic tasks to produce 95% on both tasks over three sessions   Time 8   Period Weeks   Status New          Plan - 04/15/16 1246    Clinical Impression Statement Pt presents with cognitive-communication deficts in the areas of attention and delayed auditory processing, with other higher level deficits possible. Furhter testing may be completed during ensing ST sessions. 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   Speech Therapy Frequency 3x / week   Duration --  8 weeks   Treatment/Interventions Language facilitation;Cognitive reorganization;Compensatory techniques;Internal/external aids;SLP instruction and feedback;Functional tasks;Patient/family education;Environmental controls   Potential to Achieve Goals Good      G-CODES - SLP Memory Current [phone]) CJ -  20-39% impaired Memory Goal [id number]) CI - 1-20% impaired   Patient will benefit from skilled therapeutic intervention in order to improve the following deficits and impairments:   Cognitive communication deficit    Problem List Patient Active Problem List   Diagnosis Date Noted  . Executive function deficit   . Left hemiparesis ([place]) 03/29/2016  . 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 Vascular headache   . Benign essential HTN   . Hyponatremia   . Acute blood loss anemia   . TBI (traumatic brain injury) ([place])   . Intracerebral hemorrhage 03/20/2016  . ICH (intracerebral hemorrhage) ([place]) 03/20/2016  . Colon polyps   . Osteoporosis   . HTN (hypertension) 01/08/2013    [facility] ,[place], [place]  04/15/2016, 12:54 PM  [facility] [address] [place], [place], [address] Phone: [phone]   Fax:  [phone]  Name: Cynthia [name] MRN: [id number] Date of Birth: [birth year]

## 2016-04-19 ENCOUNTER — Encounter: Payer: Medicare Other | Attending: Physical Medicine & Rehabilitation

## 2016-04-19 ENCOUNTER — Ambulatory Visit (HOSPITAL_BASED_OUTPATIENT_CLINIC_OR_DEPARTMENT_OTHER): Payer: Medicare Other | Admitting: Physical Medicine & Rehabilitation

## 2016-04-19 ENCOUNTER — Encounter: Payer: Self-pay | Admitting: Physical Medicine & Rehabilitation

## 2016-04-19 ENCOUNTER — Ambulatory Visit: Payer: Medicare Other | Admitting: Speech Pathology

## 2016-04-19 VITALS — BP 155/89 | HR 87

## 2016-04-19 DIAGNOSIS — I611 Nontraumatic intracerebral hemorrhage in hemisphere, cortical: Secondary | ICD-10-CM

## 2016-04-19 DIAGNOSIS — R41844 Frontal lobe and executive function deficit: Secondary | ICD-10-CM | POA: Diagnosis not present

## 2016-04-19 DIAGNOSIS — R414 Neurologic neglect syndrome: Secondary | ICD-10-CM | POA: Diagnosis not present

## 2016-04-19 DIAGNOSIS — I10 Essential (primary) hypertension: Secondary | ICD-10-CM | POA: Insufficient documentation

## 2016-04-19 DIAGNOSIS — Z5189 Encounter for other specified aftercare: Secondary | ICD-10-CM | POA: Insufficient documentation

## 2016-04-19 DIAGNOSIS — I69254 Hemiplegia and hemiparesis following other nontraumatic intracranial hemorrhage affecting left non-dominant side: Secondary | ICD-10-CM | POA: Insufficient documentation

## 2016-04-19 DIAGNOSIS — R41841 Cognitive communication deficit: Secondary | ICD-10-CM

## 2016-04-19 NOTE — Patient Instructions (Signed)
  Cognitive Activities you can do at home:   - [place] (easy level)  - [place] - Concentration  On your computer, tablet or phone: BrainHQ Brainbashers.com Merchandiser, retail Hour Chocolate Fix Sort it out Environmental consultant App Photo Quiz App MixTwo App What's the Word?App  Place a sign at your door to remember to put down remotes and grab your purse/phone  Use an external reminder such as a loose rubber band or rubber bracelet around your wrist   Memory Strategies  W - Write it down  A - Associate it with something  R - Repeat it  M - Mental Image     Play the memory/concentration game  Try to remember 3-5 items on your store list without looking  Study a detailed picture in a magazine for 1 minute, then write down everything you can remember from the picture

## 2016-04-19 NOTE — Progress Notes (Signed)
Patient ID: Cynthia [name], female    DOB: 04/18/1946, 70 y.o.   MRN: [id number] 70 year old right-handed female, admitted on March 20, 2016, with headache, left-sided weakness, altered mental status, and aphasia.  There was report of a recent fall from first step of a ladder several days prior.  She lives with her husband in [place], independent prior to admission.  CT of the head showed acute anterior right frontal lobe intra-axial hemorrhage, estimated hemorrhage volume 20 mL with surrounding edema, regional mass effect. MRI of the brain showed late acute-early subacute intraparenchymal hemorrhage in the right posterior frontal lobe with layering and surrounding edema  Transitional care visit, see phone call log HPI Now able to text and shuffle card deck  Has started OP SLP, PT  Seen by PCP hospital f/u, now has BP cuff and monitoring  No change in BP meds  Episode of numbness from her knee to her ankle, which lasted no more than a few seconds Felt fine after a meal  No further inc of urine No longer walking into doorways on left side  Pain Inventory Average Pain 0 Pain Right Now 0 My pain is no pain  In the last 24 hours, has pain interfered with the following? General activity 0 Relation with others 0 Enjoyment of life 0 What TIME of day is your pain at its worst? no pain Sleep (in general) Good  Pain is worse with: no pain Pain improves with: no pain Relief from Meds: no pain  Mobility walk without assistance how many minutes can you walk? 45 ability to climb steps?  yes do you drive?  no  Function not employed: date last employed .  Neuro/Psych anxiety  Prior Studies hospital f/u  Physicians involved in your care hospital f/u   Family History  Problem Relation Age of Onset  . Heart disease Mother   . Cancer Mother     lung   Social History   Social History  . Marital status: Married    Spouse name: N/A  . Number of children: N/A    . Years of education: N/A   Social History Main Topics  . Smoking status: Never Smoker  . Smokeless tobacco: Never Used  . Alcohol use No     Comment: 1-2 beers/wine in evening  . Drug use: No  . Sexual activity: Not Asked     Comment: Married since 2005   Other Topics Concern  . None   Social History Narrative  . None   History reviewed. No pertinent surgical history. Past Medical History:  Diagnosis Date  . Colon polyps   . Depression    history of  . Osteoporosis    There were no vitals taken for this visit.  Opioid Risk Score:   Fall Risk Score:  `1  Depression screen PHQ 2/9  No flowsheet data found.  Review of Systems  Constitutional: Negative.   HENT: Negative.   Eyes: Negative.   Respiratory: Negative.   Cardiovascular: Negative.   Gastrointestinal: Negative.   Endocrine: Negative.   Genitourinary: Negative.   Musculoskeletal: Negative.   Skin: Negative.   Allergic/Immunologic: Negative.   Neurological: Negative.   Hematological: Negative.   Psychiatric/Behavioral: The patient is nervous/anxious.   All other systems reviewed and are negative.      Objective:   Physical Exam  Nursing note and vitals reviewed.   General: No acute distress Mood and affect are appropriate Heart: Regular rate and rhythm no rubs murmurs or extra  sounds Lungs: Clear to auscultation, breathing unlabored, no rales or wheezes Abdomen: Positive bowel sounds, soft nontender to palpation, nondistended Extremities: No clubbing, cyanosis, or edema Skin: No evidence of breakdown, no evidence of rash Neurologic: Cranial nerves II through XII intact, motor strength is 5/5 in Right deltoid, bicep, tricep, grip, hip flexor, knee extensors, ankle dorsiflexor and plantar flexor, 4/5 on the left side in the same muscle groups Sensory exam normal sensation to light touch and pinprick in bilateral upper and lower extremities Cerebellar exam normal finger to nose to finger as well  as heel to shin in bilateral upper and lower extremities Musculoskeletal: Full range of motion in all 4 extremities. No joint swelling Visual fields are intact confrontation testing      Assessment & Plan:  1Left hemiparesis, cognitive deficits related to CVA as well as mild left neglect related to right frontal intraparenchymal hemorrhage  She is making good progress in terms of her functional status. However, still is not at a point of going back to driving going back to work. She is independent with her basic mobility and self-care   Continue outpatient PT, OT, speech Physical medicine and rehabilitation follow-up in one month  2. Hypertension. Follow-up with primary care physician

## 2016-04-19 NOTE — Therapy (Signed)
[facility] [address] [place], [place], [address] Phone: [phone]   Fax:  [phone]  Speech Language Pathology Treatment  Patient Details  Name: Cynthia [name] MRN: [id number] Date of Birth: March 21, 1946 Referring Provider: Alysia Penna  Encounter Date: 04/19/2016      End of Session - 04/19/16 1023    Visit Number 2   Number of Visits 24   Date for SLP Re-Evaluation 07/08/16   SLP Start Time 0934   SLP Stop Time  1017   SLP Time Calculation (min) 43 min      Past Medical History:  Diagnosis Date  . Colon polyps   . Depression    history of  . Osteoporosis     No past surgical history on file.  There were no vitals filed for this visit.      Subjective Assessment - 04/19/16 0938    Subjective "I didn't do my homework because we had to put my 50 year old cat down"               ADULT SLP TREATMENT - 04/19/16 0938      General Information   Behavior/Cognition Alert;Cooperative;Pleasant mood     Treatment Provided   Treatment provided Cognitive-Linquistic     Pain Assessment   Pain Assessment No/denies pain     Cognitive-Linquistic Treatment   Treatment focused on Cognition   Skilled Treatment Generated strategies for environmental compensations for attending to and recalling to leave remotes at home and grab her purse and phone.  Pt to place a sign at her door to assit with this. Alternating attention between mildly complex card sort with occasional min cues to attend to details           SLP Education - 04/19/16 1004    Education provided Yes   Education Details compensations for [employer]) Educated Patient   Methods Explanation   Comprehension Verbalized understanding          SLP Short Term Goals - 04/19/16 1021      SLP SHORT TERM GOAL #1   Title pt will alternate attention between or within two mod complex linguistic tasks with 85% success over  three sessions   Time 3   Status New     SLP SHORT TERM GOAL #2   Title pt will follow multi-unit multistep commands with modified independence PRN (requesting repeats, etc.)   Time 3   Period Weeks   Status New          SLP Long Term Goals - 04/19/16 1022      SLP LONG TERM GOAL #1   Title pt will demo improved performance on the Attention Processing Test Exercise X, when given 1-2 second delays between stimuli   Time 7   Period Weeks   Status New     SLP LONG TERM GOAL #2   Title pt will demo divided attention between two mod complex linguistic tasks to produce 95% on both tasks over three sessions   Time 7   Period Weeks   Status New          Plan - 04/19/16 1015    Clinical Impression Statement Pt continues to require occasional min verbal and visual cues for alternating attention and attention to details. Processing speed reduced as session progressed with mental fatigue.  Continue skilled ST to maximize cognition for possible return to work and to maximize independence with ADL's.   Speech Therapy  Frequency 3x / week   Duration --  8 weeks   Potential to Achieve Goals Good      Patient will benefit from skilled therapeutic intervention in order to improve the following deficits and impairments:   Cognitive communication deficit    Problem List Patient Active Problem List   Diagnosis Date Noted  . Executive function deficit   . Left hemiparesis ([place]) 03/29/2016  . 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 Vascular headache   . Benign essential HTN   . Hyponatremia   . Acute blood loss anemia   . TBI (traumatic brain injury) ([place])   . Intracerebral hemorrhage 03/20/2016  . ICH (intracerebral hemorrhage) ([place]) 03/20/2016  . Colon polyps   . Osteoporosis   . HTN (hypertension) 01/08/2013    Lovvorn, Annye Rusk MS, CCC-SLP 04/19/2016, 10:25 AM  [facility] [address] [place], [place],  [address] Phone: [phone]   Fax:  [phone]   Name: Cynthia [name] MRN: [id number] Date of Birth: January 07, 1946

## 2016-04-19 NOTE — Patient Instructions (Signed)
No driving. At the current time, we will address this next time I see you  No return to work at the current time. This is a goal, I will need to get input from the speech therapist before will we do a work release

## 2016-04-20 ENCOUNTER — Telehealth: Payer: Self-pay | Admitting: Family Medicine

## 2016-04-20 NOTE — Telephone Encounter (Signed)
Pt has been checking BP's and there are : 134/92,147/93,124/82,125/81,136/84 While at MD office yesterday it was 155/89.

## 2016-04-21 ENCOUNTER — Ambulatory Visit: Payer: Medicare Other | Admitting: Physical Therapy

## 2016-04-21 ENCOUNTER — Ambulatory Visit: Payer: Medicare Other

## 2016-04-21 DIAGNOSIS — R41841 Cognitive communication deficit: Secondary | ICD-10-CM

## 2016-04-21 DIAGNOSIS — R41844 Frontal lobe and executive function deficit: Secondary | ICD-10-CM | POA: Diagnosis not present

## 2016-04-21 MED ORDER — LOSARTAN POTASSIUM 50 MG PO TABS: 50.0000 mg | ORAL_TABLET | Freq: Every day | ORAL | 3 refills | Status: DC

## 2016-04-21 NOTE — Patient Instructions (Signed)
  Please complete the assigned speech therapy homework and return it to your next session.  

## 2016-04-21 NOTE — Telephone Encounter (Signed)
Patient aware of providers recommendations. Medication called/sent to requested pharmacy  

## 2016-04-21 NOTE — Telephone Encounter (Signed)
I would start losartan 50 mg poqday for elevated BP and recheck in 2 weeks.

## 2016-04-21 NOTE — Therapy (Signed)
[facility] [address] [place], [place], [address] Phone: [phone]   Fax:  [phone]  Speech Language Pathology Treatment  Patient Details  Name: Cynthia [name] MRN: [id number] Date of Birth: [birth year] Referring Provider: Alysia Penna  Encounter Date: 04/21/2016      End of Session - 04/21/16 1616    Visit Number 3   Number of Visits 24   Date for SLP Re-Evaluation 07/08/16   SLP Start Time 1448   SLP Stop Time  1530   SLP Time Calculation (min) 42 min   Activity Tolerance Patient tolerated treatment well      Past Medical History:  Diagnosis Date  . Colon polyps   . Depression    history of  . Osteoporosis     No past surgical history on file.  There were no vitals filed for this visit.      Subjective Assessment - 04/21/16 1455    Currently in Pain? No/denies               ADULT SLP TREATMENT - 04/21/16 1538      General Information   Behavior/Cognition Alert;Cooperative;Pleasant mood     Treatment Provided   Treatment provided Cognitive-Linquistic     Cognitive-Linquistic Treatment   Treatment focused on Cognition   Skilled Treatment Alternating atttention using simple organization tasks req'd consistent extra time with auditory, written, or visual stimuli. Pt explained her answers in a mod complex reasoning task to SLP with min-mod A.     Assessment / Recommendations / Plan   Plan Continue with current plan of care     Progression Toward Goals   Progression toward goals Progressing toward goals            SLP Short Term Goals - 04/21/16 1618      SLP SHORT TERM GOAL #1   Title pt will alternate attention between or within two mod complex linguistic tasks with 85% success over three sessions   Time 4   Status On-going     SLP SHORT TERM GOAL #2   Title pt will follow multi-unit multistep commands with modified independence PRN (requesting repeats, etc.)    Time 4   Period Weeks   Status On-going          SLP Long Term Goals - 04/21/16 1618      SLP LONG TERM GOAL #1   Title pt will demo improved performance on the Attention Processing Test Exercise X, when given 1-2 second delays between stimuli   Time 8   Period Weeks   Status On-going     SLP LONG TERM GOAL #2   Title pt will demo divided attention between two mod complex linguistic tasks to produce 95% on both tasks over three sessions   Time 8   Period Weeks   Status On-going          Plan - 04/21/16 1617    Clinical Impression Statement Pt continues to require cues for alternating attention and attention to details. Processing speed again shown as reduced with varying stimuli.  Continue skilled ST to maximize cognition for possible return to work and to maximize independence with ADL's.   Speech Therapy Frequency 3x / week   Duration --  8 weeks   Treatment/Interventions Language facilitation;Cognitive reorganization;Compensatory techniques;Internal/external aids;SLP instruction and feedback;Functional tasks;Patient/family education;Environmental controls   Potential to Achieve Goals Good      Patient will benefit from skilled therapeutic intervention in order to  improve the following deficits and impairments:   Cognitive communication deficit    Problem List Patient Active Problem List   Diagnosis Date Noted  . Cognitive deficit due to recent stroke   . Left hemiparesis ([place]) 03/29/2016  . 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 Vascular headache   . Benign essential HTN   . Hyponatremia   . Acute blood loss anemia   . TBI (traumatic brain injury) ([place])   . Intracerebral hemorrhage 03/20/2016  . ICH (intracerebral hemorrhage) ([place]) 03/20/2016  . Colon polyps   . Osteoporosis   . HTN (hypertension) 01/08/2013    [facility] ,MS, [place]  04/21/2016, 4:19 PM  [facility] [address] [place], [place], [address] Phone: [phone]   Fax:  [phone]   Name: Cynthia [name] MRN: [id number] Date of Birth: 1946/03/19

## 2016-04-22 ENCOUNTER — Ambulatory Visit: Payer: Medicare Other | Admitting: Occupational Therapy

## 2016-04-22 ENCOUNTER — Encounter: Payer: Self-pay | Admitting: Occupational Therapy

## 2016-04-22 DIAGNOSIS — R41844 Frontal lobe and executive function deficit: Secondary | ICD-10-CM

## 2016-04-22 DIAGNOSIS — G8194 Hemiplegia, unspecified affecting left nondominant side: Secondary | ICD-10-CM

## 2016-04-22 DIAGNOSIS — R41842 Visuospatial deficit: Secondary | ICD-10-CM

## 2016-04-22 DIAGNOSIS — R278 Other lack of coordination: Secondary | ICD-10-CM

## 2016-04-22 DIAGNOSIS — M6281 Muscle weakness (generalized): Secondary | ICD-10-CM

## 2016-04-22 NOTE — Patient Instructions (Signed)
  Coordination Activities  Perform the following activities for 10 minutes 2-3 times per day with left hand(s).   Rotate ball in fingertips (clockwise and counter-clockwise).  Toss ball between hands.  Toss ball in air and catch with the same hand.  Deal cards with your thumb (Hold deck in hand and push card off top with thumb).  Rotate card in hand (clockwise and counter-clockwise).  Shuffle cards.

## 2016-04-22 NOTE — Therapy (Signed)
[facility] [address] [place], [place], [address] Phone: [phone]   Fax:  [phone]  Occupational Therapy Treatment  Patient Details  Name: Cynthia [name] MRN: [id number] Date of Birth: 28-Jan-1946 Referring Provider: Dr. Alysia Penna  Encounter Date: 04/22/2016      OT End of Session - 04/22/16 1650    Visit Number 2   Number of Visits 16   Date for OT Re-Evaluation 06/07/16   Authorization Type [employer] medicare - will need g code and PN every 10 visits   Authorization Time Period 60 days   Authorization - Visit Number 2   Authorization - Number of Visits 10   OT Start Time 8144   OT Stop Time 1530   OT Time Calculation (min) 43 min   Activity Tolerance Patient tolerated treatment well   Behavior During Therapy [facility] for tasks assessed/performed      Past Medical History:  Diagnosis Date  . Colon polyps   . Depression    history of  . Osteoporosis     History reviewed. No pertinent surgical history.  There were no vitals filed for this visit.      Subjective Assessment - 04/22/16 1505    Subjective  I can text now!   Pertinent History see epic snapshot;  R frontal ICH possibly due to fall   Patient Stated Goals I am hoping to be able to get back to work in the fall   Currently in Pain? No/denies   Pain Score 0-No pain            [facility] OT Assessment - 04/22/16 0001      Coordination   9 Hole Peg Test Right;Left   Right 9 Hole Peg Test 27.47   Left 9 Hole Peg Test 32.71                  OT Treatments/Exercises (OP) - 04/22/16 0001      ADLs   UB Dressing Patient reports that now she is able to dress herself without any prompts from her husband.  Husband not present this session to confirm or deny this report.     Bathing Patient reports that she has been able to complete her shower transfer and shower without assistance.  Husband was not in the bathroom the last few  days.       Hand Exercises   Fine Motor Coordination In hand manipuation training;Small Pegboard   In Hand Manipulation Training Patient had difficulty rotating two balls in her left hand.  With demonstration , cueing, and increased time, patient able to complete this task, although changing direction.     Other Hand Exercises Spent time with patient setting up HEP for coordiantion exercises - see patient instructions.                  OT Education - 04/22/16 1649    Education provided Yes   Education Details HEP - fine motor coordination   Person(s) Educated Patient   Methods Explanation;Demonstration;Verbal cues;Handout   Comprehension Verbalized understanding;Returned demonstration          OT Short Term Goals - 04/22/16 1654      OT SHORT TERM GOAL #1   Title Pt and husband will be mod I with home activities program- 05/10/2016   Status On-going     OT SHORT TERM GOAL #2   Title Pt will demonstrate improved grip strength by at least 5 pounds to assist with  functional tasks (baseline=45)   Status On-going     OT SHORT TERM GOAL #3   Title Patient will reduce time with 9 hole peg test in left hand by 3 seconds to improve functional use of non dominant hand with ADL/IADL   Baseline Left: 32.71 04/22/16   Status New     OT SHORT TERM GOAL #4   Title Pt will require no more than min questioning cues for moderately complex problem solving functional tasks   Status On-going     OT SHORT TERM GOAL #5   Title Pt will require no more than min a for alternating attention with familiar, moderately complex functional tasks   Status On-going     OT SHORT TERM GOAL #6   Title Pt will be mod I with showering   Status On-going  Need to confimr with husband, patient indicates goal met     OT SHORT TERM GOAL #7   Title Pt will be mod I with dressing   Status On-going  Need to confirm with husband     OT SHORT TERM GOAL #8   Title Pt will be mod I with toilet and shower  transfers   Status On-going  Patient reports this goal is met - need to confirm with husband           OT Long Term Goals - 04/12/16 1037      OT LONG TERM GOAL #1   Title Pt and husband will be mod I with home activities program - 06/07/2016   Status New     OT LONG TERM GOAL #2   Title Pt wil demonstrate at least 7 pound increase in grip strength to assist with functional tasks (baseline=45)   Status New     OT LONG TERM GOAL #3   Title Pt will be mod I with simple familar hot meal prep   Status New     OT LONG TERM GOAL #5   Title Pt will require no more than 2 cues for moderately complext problem solving functional tasks   Status New     Long Term Additional Goals   Additional Long Term Goals Yes     OT LONG TERM GOAL #6   Title Pt will be mod I with alternating attention for moderately complext task requiring alternating attention   Status New               Plan - 04/22/16 1652    Clinical Impression Statement Patient reports improvement in self performance with BADL's at this time.  Progressing toward OT goals   Rehab Potential Good   OT Frequency 2x / week   OT Duration 8 weeks   OT Treatment/Interventions Self-care/ADL training;DME and/or AE instruction;Neuromuscular education;Therapeutic exercise;Therapist, nutritional;Therapeutic activities;Balance training;Patient/family education;Visual/perceptual remediation/compensation;Cognitive remediation/compensation   Plan theraputty and gripper - improve hand strength, coordination - tossing, catching- hs difficulty releasing with left hand   OT Home Exercise Plan Initiated HEP for fine motor coordination   Consulted and Agree with Plan of Care Patient      Patient will benefit from skilled therapeutic intervention in order to improve the following deficits and impairments:  Decreased activity tolerance, Decreased balance, Decreased coordination, Decreased cognition, Decreased strength, Impaired UE  functional use, Impaired tone, Impaired vision/preception, Pain  Visit Diagnosis: Muscle weakness (generalized)  Frontal lobe and executive function deficit  Hemiplegia, unspecified affecting left nondominant side (HCC)  Visuospatial deficit  Other lack of coordination    Problem List  Patient Active Problem List   Diagnosis Date Noted  . Cognitive deficit due to recent stroke   . Left hemiparesis ([place]) 03/29/2016  . 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 Vascular headache   . Benign essential HTN   . Hyponatremia   . Acute blood loss anemia   . TBI (traumatic brain injury) ([place])   . Intracerebral hemorrhage 03/20/2016  . ICH (intracerebral hemorrhage) ([place]) 03/20/2016  . Colon polyps   . Osteoporosis   . HTN (hypertension) 01/08/2013    Mariah Milling, OTR/L 04/22/2016, 4:59 PM  [facility] [address] [place], [place], [address] Phone: [phone]   Fax:  [phone]  Name: Cynthia [name] MRN: [id number] Date of Birth: 1946-05-28

## 2016-04-26 ENCOUNTER — Encounter: Payer: Self-pay | Admitting: Occupational Therapy

## 2016-04-26 ENCOUNTER — Ambulatory Visit: Payer: Medicare Other | Admitting: Speech Pathology

## 2016-04-26 ENCOUNTER — Ambulatory Visit: Payer: Medicare Other | Admitting: Occupational Therapy

## 2016-04-26 ENCOUNTER — Ambulatory Visit: Payer: Medicare Other | Admitting: Physical Therapy

## 2016-04-26 DIAGNOSIS — R2689 Other abnormalities of gait and mobility: Secondary | ICD-10-CM

## 2016-04-26 DIAGNOSIS — R41841 Cognitive communication deficit: Secondary | ICD-10-CM

## 2016-04-26 DIAGNOSIS — R41842 Visuospatial deficit: Secondary | ICD-10-CM

## 2016-04-26 DIAGNOSIS — R41844 Frontal lobe and executive function deficit: Secondary | ICD-10-CM | POA: Diagnosis not present

## 2016-04-26 DIAGNOSIS — G8194 Hemiplegia, unspecified affecting left nondominant side: Secondary | ICD-10-CM

## 2016-04-26 DIAGNOSIS — R278 Other lack of coordination: Secondary | ICD-10-CM

## 2016-04-26 DIAGNOSIS — M6281 Muscle weakness (generalized): Secondary | ICD-10-CM

## 2016-04-26 NOTE — Therapy (Signed)
[facility] [address] [place], [place], [address] Phone: [phone]   Fax:  [phone]  Occupational Therapy Treatment  Patient Details  Name: Cynthia [name] MRN: [id number] Date of Birth: March 31, 1946 Referring Provider: Dr. Alysia Penna  Encounter Date: 04/26/2016      OT End of Session - 04/26/16 1539    Visit Number 3   Number of Visits 16   Date for OT Re-Evaluation 06/07/16   Authorization Type UHC medicare - will need g code and PN every 10 visits   Authorization Time Period 60 days   Authorization - Visit Number 3   Authorization - Number of Visits 10   OT Start Time 1318   OT Stop Time 1400   OT Time Calculation (min) 42 min   Activity Tolerance Patient tolerated treatment well      Past Medical History:  Diagnosis Date  . Colon polyps   . Depression    history of  . Osteoporosis     History reviewed. No pertinent surgical history.  There were no vitals filed for this visit.      Subjective Assessment - 04/26/16 1320    Subjective  I got released from PT today   Pertinent History see epic snapshot;  R frontal ICH possibly due to fall   Patient Stated Goals I am hoping to be able to get back to work in the fall   Currently in Pain? No/denies                      OT Treatments/Exercises (OP) - 04/26/16 0001      Exercises   Exercises Hand     Hand Exercises   Theraputty Flatten;Roll;Grip;Pinch;Locate Pegs   Theraputty - Flatten Instructed pt in HEP using green putty - see pt instructions for details.  Pt able to return demonstrate all activities.    Fine Motor Coordination In hand manipuation training   In Hand Manipulation Training Pt reports that she has been working on coordination activities at home for HEP.  Addressed today in the clinic with functional tasks with emphasis in hand manipulation, grading of force, and alternating movement patterns.                 OT Education - 04/26/16 1348    Education provided Yes   Education Details Green putty HEP for grip strength   Person(s) Educated Patient   Methods Explanation;Demonstration;Verbal cues;Handout   Comprehension Verbalized understanding;Returned demonstration          OT Short Term Goals - 04/26/16 1349      OT SHORT TERM GOAL #1   Title Pt and husband will be mod I with home activities program- 05/10/2016   Status On-going     OT SHORT TERM GOAL #2   Title Pt will demonstrate improved grip strength by at least 5 pounds to assist with functional tasks (baseline=45)   Status On-going     OT SHORT TERM GOAL #3   Title Patient will reduce time with 9 hole peg test in left hand by 3 seconds to improve functional use of non dominant hand with ADL/IADL   Baseline Left: 32.71 04/22/16   Status On-going     OT SHORT TERM GOAL #4   Title Pt will require no more than min questioning cues for moderately complex problem solving functional tasks   Status On-going     OT SHORT TERM GOAL #5   Title Pt will  require no more than min a for alternating attention with familiar, moderately complex functional tasks   Status On-going     OT SHORT TERM GOAL #6   Title Pt will be mod I with showering   Status On-going  Need to confimr with husband, patient indicates goal met     OT SHORT TERM GOAL #7   Title Pt will be mod I with dressing   Status On-going  Need to confirm with husband     OT SHORT TERM GOAL #8   Title Pt will be mod I with toilet and shower transfers   Status On-going  Patient reports this goal is met - need to confirm with husband           OT Long Term Goals - 04/26/16 1349      OT LONG TERM GOAL #1   Title Pt and husband will be mod I with home activities program - 06/07/2016   Status On-going     OT LONG TERM GOAL #2   Title Pt wil demonstrate at least 7 pound increase in grip strength to assist with functional tasks (baseline=45)    Status On-going     OT LONG TERM GOAL #3   Title Pt will be mod I with simple familar hot meal prep   Status On-going     OT LONG TERM GOAL #5   Title Pt will require no more than 2 cues for moderately complext problem solving functional tasks   Status On-going     OT LONG TERM GOAL #6   Title Pt will be mod I with alternating attention for moderately complext task requiring alternating attention   Status On-going               Plan - 04/26/16 1537    Clinical Impression Statement Pt progressing toward goals.  Pt with much brighter affect today.   Rehab Potential Good   OT Frequency 2x / week   OT Duration 8 weeks   OT Treatment/Interventions Self-care/ADL training;DME and/or AE instruction;Neuromuscular education;Therapeutic exercise;Therapist, nutritional;Therapeutic activities;Balance training;Patient/family education;Visual/perceptual remediation/compensation;Cognitive remediation/compensation   Plan check theraputty HEP program, address problem solving tasks, could do cooking task with emphasis on organization and problem solving   Consulted and Agree with Plan of Care Patient      Patient will benefit from skilled therapeutic intervention in order to improve the following deficits and impairments:  Decreased activity tolerance, Decreased balance, Decreased coordination, Decreased cognition, Decreased strength, Impaired UE functional use, Impaired tone, Impaired vision/preception, Pain  Visit Diagnosis: Other lack of coordination  Frontal lobe and executive function deficit  Hemiplegia, unspecified affecting left nondominant side (HCC)  Visuospatial deficit    Problem List Patient Active Problem List   Diagnosis Date Noted  . Cognitive deficit due to recent stroke   . Left hemiparesis ([place]) 03/29/2016  . 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 Vascular headache   . Benign essential HTN   . Hyponatremia   . Acute blood loss anemia   . TBI (traumatic  brain injury) ([place])   . Intracerebral hemorrhage 03/20/2016  . ICH (intracerebral hemorrhage) ([place]) 03/20/2016  . Colon polyps   . Osteoporosis   . HTN (hypertension) 01/08/2013    Quay Burow, OTR/L 04/26/2016, 3:41 PM  [facility] [address] [place], [place], [address] Phone: [phone]   Fax:  [phone]  Name: Cynthia [name] MRN: [id number] Date of Birth: 04/26/1946

## 2016-04-26 NOTE — Therapy (Signed)
[facility] [address] [place], [place], [address] Phone: [phone]   Fax:  [phone]  Physical Therapy Treatment  Patient Details  Name: Cynthia [name] MRN: [id number] Date of Birth: 02-Aug-1946 Referring Provider: Dr. Alysia Penna   Encounter Date: 04/26/2016      PT End of Session - 04/26/16 1314    Visit Number 2   Number of Visits 8   Date for PT Re-Evaluation 05/13/16   Authorization Type UHC Medicare   Authorization Time Period 04-12-16 - 06-11-16   PT Start Time 1148   PT Stop Time 1220   PT Time Calculation (min) 32 min   Activity Tolerance Patient tolerated treatment well   Behavior During Therapy [facility] for tasks assessed/performed      Past Medical History:  Diagnosis Date  . Colon polyps   . Depression    history of  . Osteoporosis     No past surgical history on file.  There were no vitals filed for this visit.      Subjective Assessment - 04/26/16 1152    Subjective "I've noticed I can do some things that I was unable to do."  Denies falls or changes.   Pertinent History HTN:  osteoporosis: TBI - fell off ladder in March 2017 and hit head   Patient Stated Goals "build strength and endurance back up"   Currently in Pain? No/denies                         [facility] Adult PT Treatment/Exercise - 04/26/16 0001      Ambulation/Gait   Ambulation/Gait Yes   Ambulation/Gait Assistance 7: Independent   Ambulation Distance (Feet) 200 Feet  plus multiple times   Assistive device None   Gait Pattern Within Functional Limits   Ambulation Surface Level;Indoor   Gait velocity 4.21 ft/sec  7.8 seconds 74m  Stairs Yes   Stairs Assistance 7: Independent   Stair Management Technique No rails   Number of Stairs 4   Height of Stairs 6     Dynamic Gait Index   Level Surface Normal   Change in Gait Speed Normal   Gait with Horizontal Head Turns Normal   Gait with Vertical  Head Turns Normal   Gait and Pivot Turn Normal   Step Over Obstacle Normal   Step Around Obstacles Normal   Steps Normal   Total Score 24             Balance Exercises - 04/26/16 1307      Balance Exercises: Standing   Tandem Stance Eyes open;2 reps;10 secs   SLS Eyes open;2 reps;10 secs   Rockerboard Anterior/posterior;Lateral;Head turns;EO;EC;10 reps   Other Standing Exercises Black platform of BOSU with head turns/nods eyes opened           PT Education - 04/26/16 1309    Education provided Yes   Education Details Discharge from PT as goals met   Person(s) Educated Patient   Methods Explanation   Comprehension Verbalized understanding             PT Long Term Goals - 04/26/16 1310      PT LONG TERM GOAL #1   Title Complete DGI and establish goal as appropriate.  (05-13-16)   Baseline Scored 24/24 on 04/26/16   Time 4   Period Weeks   Status Deferred     PT LONG TERM GOAL #2   Title Increase gait  velocity to >/= 3.6 ft/sec without device for incr. gait efficiency.  (05-13-16)   Baseline 4.21 ft/sec on 05/10/16   Time 4   Period Weeks   Status Achieved     PT LONG TERM GOAL #3   Title Negotiate 12 steps (to simulate flight) using step over step sequence to demo improved balance.  (05-13-16)   Time 4   Period Weeks   Status Achieved     PT LONG TERM GOAL #4   Title Pt will report ability to walk her dog without experiencing LOB.  (05-13-16)   Baseline Walking > 1.5 miles   Time 4   Period Weeks   Status Achieved     PT LONG TERM GOAL #5   Title Pt will amb. 10" nonstop on even/uneven surface with S without c/o fatigue to demo increased endurance.  (05-13-16)   Time 4   Period Weeks   Status Achieved     PT LONG TERM GOAL #6   Title Independent in HEP for endurance and high level balance.  (05-13-16)   Time 4   Period Weeks   Status Deferred               Plan - 10-May-2016 1314    Clinical Impression Statement Pt has met all goals and feels  ready for d/c.  Discharge from PT per [facility], PT.   Rehab Potential Good   PT Frequency 2x / week   PT Duration 4 weeks   PT Treatment/Interventions ADLs/Self Care Home Management;Gait training;Stair training;Therapeutic activities;Therapeutic exercise;Balance training;Neuromuscular re-education;Patient/family education;Vestibular   PT Next Visit Plan Discharge from PT per [facility].   PT Home Exercise Plan high level balance, walking program   Consulted and Agree with Plan of Care Patient      Patient will benefit from skilled therapeutic intervention in order to improve the following deficits and impairments:  Difficulty walking, Decreased balance, Decreased activity tolerance, Decreased endurance, Decreased strength  Visit Diagnosis: Muscle weakness (generalized)  Other abnormalities of gait and mobility  Other lack of coordination       G-Codes - 05/10/16 1747    Functional Assessment Tool Used Gait velocity 4.21 ft/sec, DGI 24/24    Functional Limitation Mobility: Walking and moving around   Mobility: Walking and Moving Around Goal Status [phone]) At least 1 percent but less than 20 percent impaired, limited or restricted   Mobility: Walking and Moving Around Discharge Status [phone]) 0 percent impaired, limited or restricted      Problem List Patient Active Problem List   Diagnosis Date Noted  . Cognitive deficit due to recent stroke   . Left hemiparesis ([place]) 03/29/2016  . 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 Vascular headache   . Benign essential HTN   . Hyponatremia   . Acute blood loss anemia   . TBI (traumatic brain injury) ([place])   . Intracerebral hemorrhage 03/20/2016  . ICH (intracerebral hemorrhage) ([place]) 03/20/2016  . Colon polyps   . Osteoporosis   . HTN (hypertension) 01/08/2013   PHYSICAL THERAPY DISCHARGE SUMMARY  Visits from Start of Care: 2  Current functional level related to goals / functional outcomes: All LTG's met - no  residual physical deficits from CVA noted at this time - see progress towards goals above   Remaining deficits: None regarding physical deficits   Education / Equipment: N/A Plan: Patient agrees to discharge.  Patient goals were met. Patient is being discharged due to meeting the stated rehab goals.  ?????  Alda Lea, PT 04/26/2016, 5:48 PM  [facility] [address] [place], [place], [address] Phone: [phone]   Fax:  [phone]  Name: Tirzah Fross MRN: [id number] Date of Birth: [birth year]

## 2016-04-26 NOTE — Therapy (Signed)
[facility] [address] [place], [place], [address] Phone: [phone]   Fax:  [phone]  Physical Therapy Treatment  Patient Details  Name: Cynthia [name] MRN: [id number] Date of Birth: Dec 30, 1945 Referring Provider: Dr. Alysia Penna   Encounter Date: 04/26/2016      PT End of Session - 04/26/16 1314    Visit Number 2   Number of Visits 8   Date for PT Re-Evaluation 05/13/16   Authorization Type UHC Medicare   Authorization Time Period 04-12-16 - 06-11-16   PT Start Time 1148   PT Stop Time 1220   PT Time Calculation (min) 32 min   Activity Tolerance Patient tolerated treatment well   Behavior During Therapy [facility] for tasks assessed/performed      Past Medical History:  Diagnosis Date  . Colon polyps   . Depression    history of  . Osteoporosis     No past surgical history on file.  There were no vitals filed for this visit.      Subjective Assessment - 04/26/16 1152    Subjective "I've noticed I can do some things that I was unable to do."  Denies falls or changes.   Pertinent History HTN:  osteoporosis: TBI - fell off ladder in March 2017 and hit head   Patient Stated Goals "build strength and endurance back up"   Currently in Pain? No/denies             [facility] Adult PT Treatment/Exercise - 04/26/16 0001      Ambulation/Gait   Ambulation/Gait Yes   Ambulation/Gait Assistance 7: Independent   Ambulation Distance (Feet) 200 Feet  plus multiple times   Assistive device None   Gait Pattern Within Functional Limits   Ambulation Surface Level;Indoor   Gait velocity 4.21 ft/sec  7.8 seconds 38m  Stairs Yes   Stairs Assistance 7: Independent   Stair Management Technique No rails   Number of Stairs 4   Height of Stairs 6     Dynamic Gait Index   Level Surface Normal   Change in Gait Speed Normal   Gait with Horizontal Head Turns Normal   Gait with Vertical Head Turns Normal   Gait  and Pivot Turn Normal   Step Over Obstacle Normal   Step Around Obstacles Normal   Steps Normal   Total Score 24             Balance Exercises - 04/26/16 1307      Balance Exercises: Standing   Tandem Stance Eyes open;2 reps;10 secs   SLS Eyes open;2 reps;10 secs   Rockerboard Anterior/posterior;Lateral;Head turns;EO;EC;10 reps   Other Standing Exercises Black platform of BOSU with head turns/nods eyes opened           PT Education - 04/26/16 1309    Education provided Yes   Education Details Discharge from PT as goals met   Person(s) Educated Patient   Methods Explanation   Comprehension Verbalized understanding             PT Long Term Goals - 04/26/16 1310      PT LONG TERM GOAL #1   Title Complete DGI and establish goal as appropriate.  (05-13-16)   Baseline Scored 24/24 on 04/26/16   Time 4   Period Weeks   Status Deferred     PT LONG TERM GOAL #2   Title Increase gait velocity to >/= 3.6 ft/sec without device for incr. gait efficiency.  (  05-13-16)   Baseline 4.21 ft/sec on 04/26/16   Time 4   Period Weeks   Status Achieved     PT LONG TERM GOAL #3   Title Negotiate 12 steps (to simulate flight) using step over step sequence to demo improved balance.  (05-13-16)   Time 4   Period Weeks   Status Achieved     PT LONG TERM GOAL #4   Title Pt will report ability to walk her dog without experiencing LOB.  (05-13-16)   Baseline Walking > 1.5 miles   Time 4   Period Weeks   Status Achieved     PT LONG TERM GOAL #5   Title Pt will amb. 10" nonstop on even/uneven surface with S without c/o fatigue to demo increased endurance.  (05-13-16)   Time 4   Period Weeks   Status Achieved     PT LONG TERM GOAL #6   Title Independent in HEP for endurance and high level balance.  (05-13-16)   Time 4   Period Weeks   Status Deferred               Plan - 04/26/16 1314    Clinical Impression Statement Pt has met all goals and feels ready for d/c.  Discharge  from PT per Suzanne Dilday, PT.   Rehab Potential Good   PT Frequency 2x / week   PT Duration 4 weeks   PT Treatment/Interventions ADLs/Self Care Home Management;Gait training;Stair training;Therapeutic activities;Therapeutic exercise;Balance training;Neuromuscular re-education;Patient/family education;Vestibular   PT Next Visit Plan Discharge from PT per Suzanne Dilday.   PT Home Exercise Plan high level balance, walking program   Consulted and Agree with Plan of Care Patient      Patient will benefit from skilled therapeutic intervention in order to improve the following deficits and impairments:  Difficulty walking, Decreased balance, Decreased activity tolerance, Decreased endurance, Decreased strength  Visit Diagnosis: Muscle weakness (generalized)  Other abnormalities of gait and mobility  Other lack of coordination       G-Codes - 04/26/16 1315    Functional Assessment Tool Used Gait velocity 4.21 ft/sec, DGI 24/24       Problem List Patient Active Problem List   Diagnosis Date Noted  . Cognitive deficit due to recent stroke   . Left hemiparesis (HCC) 03/29/2016  . 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 Vascular headache   . Benign essential HTN   . Hyponatremia   . Acute blood loss anemia   . TBI (traumatic brain injury) (HCC)   . Intracerebral hemorrhage 03/20/2016  . ICH (intracerebral hemorrhage) (HCC) 03/20/2016  . Colon polyps   . Osteoporosis   . HTN (hypertension) 01/08/2013    ,  Terry 04/26/2016, 1:16 PM  [facility] [address] [place], [place], [address] Phone: [phone]   Fax:  [phone]  Name: Arlanda Borelli MRN: [id number] Date of Birth: 03/19/1946    Terry , PTA [facility] 04/26/16 1:16 PM Phone: [phone] Fax: [phone]   

## 2016-04-26 NOTE — Patient Instructions (Signed)
Theraputty HEP:  Green.  Do these exercises 1-2 times/day every day!  1. Make a ball 2. Make a pancake 3. Make a cone Do this sequence 3 times  4. Make a fat hot dog. Squeeze as hard as you can.   Do 10 times  5.  Ring around the fingers.  Do 7 times 6. Make a snake: 2 pinch 3 pinch Lateral pinch  Do each pinch 2 times

## 2016-04-26 NOTE — Therapy (Signed)
[facility] [address] [place], [place], [address] Phone: [phone]   Fax:  [phone]  Speech Language Pathology Treatment  Patient Details  Name: Cynthia [name] MRN: [id number] Date of Birth: May 06, 1946 Referring Provider: Alysia Penna  Encounter Date: 04/26/2016      End of Session - 04/26/16 1352    SLP Start Time 36   SLP Stop Time  1314   SLP Time Calculation (min) 43 min      Past Medical History:  Diagnosis Date  . Colon polyps   . Depression    history of  . Osteoporosis     No past surgical history on file.  There were no vitals filed for this visit.      Subjective Assessment - 04/26/16 1239    Subjective "I did crossword in the newspaper - the Saturday is the hard one"   Currently in Pain? No/denies               ADULT SLP TREATMENT - 04/26/16 1241      General Information   Behavior/Cognition Alert;Cooperative;Pleasant mood     Treatment Provided   Treatment provided Cognitive-Linquistic     Cognitive-Linquistic Treatment   Treatment focused on Cognition   Skilled Treatment Facilitated alternating attention between mildly complex reasoning tasks written and verbal task of stating bills and coins for a given amount - with 95% accuracy on reasoning task - pt ID'd and corrected 2 attetion to detail errors with SBA.  100% on verbal money task. Pt reports working on cognitive activities at home with success.     Assessment / Recommendations / Plan   Plan Continue with current plan of care            SLP Short Term Goals - 04/26/16 1350      SLP SHORT TERM GOAL #1   Title pt will alternate attention between or within two mod complex linguistic tasks with 85% success over three sessions   Baseline 04/26/16   Time 3   Status On-going     SLP SHORT TERM GOAL #2   Title pt will follow multi-unit multistep commands with modified independence PRN (requesting  repeats, etc.)   Time 3   Period Weeks   Status On-going          SLP Long Term Goals - 04/26/16 1351      SLP LONG TERM GOAL #1   Title pt will demo improved performance on the Attention Processing Test Exercise X, when given 1-2 second delays between stimuli   Time 7   Period Weeks   Status On-going     SLP LONG TERM GOAL #2   Title pt will demo divided attention between two mod complex linguistic tasks to produce 95% on both tasks over three sessions   Time 7   Period Weeks   Status On-going          Plan - 04/26/16 1351    Clinical Impression Statement Pt alternated attention today with SBA for error ID and correction, 95% on each task with supervision cues. 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       Patient will benefit from skilled therapeutic intervention in order to improve the following deficits and impairments:   Cognitive communication deficit    Problem List Patient Active Problem List   Diagnosis Date Noted  . Cognitive deficit due  to recent stroke   . Left hemiparesis ([place]) 03/29/2016  . 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 Vascular headache   . Benign essential HTN   . Hyponatremia   . Acute blood loss anemia   . TBI (traumatic brain injury) ([place])   . Intracerebral hemorrhage 03/20/2016  . ICH (intracerebral hemorrhage) ([place]) 03/20/2016  . Colon polyps   . Osteoporosis   . HTN (hypertension) 01/08/2013    Cynthia [name], Cynthia Rusk MS, CCC-SLP 04/26/2016, 1:53 PM  [facility] [address] [place], [place], [address] Phone: [phone]   Fax:  [phone]   Name: Cynthia [name] MRN: [id number] Date of Birth: March 01, 1946

## 2016-04-27 ENCOUNTER — Other Ambulatory Visit: Payer: Self-pay | Admitting: Family Medicine

## 2016-04-28 ENCOUNTER — Ambulatory Visit: Payer: Medicare Other

## 2016-04-28 ENCOUNTER — Ambulatory Visit: Payer: Medicare Other | Admitting: Occupational Therapy

## 2016-04-28 ENCOUNTER — Ambulatory Visit: Payer: Medicare Other | Admitting: Physical Therapy

## 2016-04-28 DIAGNOSIS — R41841 Cognitive communication deficit: Secondary | ICD-10-CM

## 2016-04-28 DIAGNOSIS — R278 Other lack of coordination: Secondary | ICD-10-CM

## 2016-04-28 DIAGNOSIS — R41844 Frontal lobe and executive function deficit: Secondary | ICD-10-CM

## 2016-04-28 DIAGNOSIS — G8194 Hemiplegia, unspecified affecting left nondominant side: Secondary | ICD-10-CM

## 2016-04-28 DIAGNOSIS — R41842 Visuospatial deficit: Secondary | ICD-10-CM

## 2016-04-28 NOTE — Therapy (Signed)
[facility] [address] [place], [place], [address] Phone: [phone]   Fax:  [phone]  Speech Language Pathology Treatment  Patient Details  Name: Cynthia [name] MRN: [id number] Date of Birth: 19-Apr-1946 Referring Provider: Alysia Penna  Encounter Date: 04/28/2016      End of Session - 04/28/16 1059    Visit Number 5   Number of Visits 24   Date for SLP Re-Evaluation 07/08/16   SLP Start Time 1018   SLP Stop Time  1100   SLP Time Calculation (min) 42 min   Activity Tolerance Patient tolerated treatment well      Past Medical History:  Diagnosis Date  . Colon polyps   . Depression    history of  . Osteoporosis     No past surgical history on file.  There were no vitals filed for this visit.      Subjective Assessment - 04/28/16 1032    Subjective Pt reported tremor in hands in OT, and now in ST, no tremor.   Currently in Pain? No/denies               ADULT SLP TREATMENT - 04/28/16 1033      General Information   Behavior/Cognition Alert;Cooperative;Pleasant mood     Treatment Provided   Treatment provided Cognitive-Linquistic     Cognitive-Linquistic Treatment   Treatment focused on Cognition   Skilled Treatment Pt provided homework to SLP upon entering [facility] room. Pt reported having to make corrections on all tasks, but caught all errors. In alternating attention tasks (mod complex) tasks, pt was successful at timely return to written task. She double checked all work, and missed errors 10% of the time.      Assessment / Recommendations / Plan   Plan Continue with current plan of care     Progression Toward Goals   Progression toward goals Progressing toward goals            SLP Short Term Goals - 04/28/16 1101      SLP SHORT TERM GOAL #1   Title pt will alternate attention between or within two mod complex linguistic tasks with 85% success over three sessions   Baseline 04/26/16, 04-28-16   Time 3   Status On-going     SLP SHORT TERM GOAL #2   Title pt will follow verbally given multi-unit multistep commands with modified independence PRN (requesting repeats, etc.)   Time 3   Period Weeks   Status On-going          SLP Long Term Goals - 04/28/16 1101      SLP LONG TERM GOAL #1   Title pt will demo improved performance on the Attention Processing Test Exercise X, when given 1-2 second delays between stimuli   Time 7   Period Weeks   Status On-going     SLP LONG TERM GOAL #2   Title pt will demo divided attention between two mod complex linguistic tasks to produce 95% on both tasks over three sessions   Time 7   Period Weeks   Status On-going          Plan - 04/28/16 1100    Clinical Impression Statement Pt alternated attention today with SBA for error ID and correction, 90% and 100% on each task, independently. 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    Speech Therapy Frequency 3x / week  Duration --  7 weeks      Patient will benefit from skilled therapeutic intervention in order to improve the following deficits and impairments:   Cognitive communication deficit    Problem List Patient Active Problem List   Diagnosis Date Noted  . Cognitive deficit due to recent stroke   . Left hemiparesis ([place]) 03/29/2016  . 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 Vascular headache   . Benign essential HTN   . Hyponatremia   . Acute blood loss anemia   . TBI (traumatic brain injury) ([employer])   . Intracerebral hemorrhage 03/20/2016  . ICH (intracerebral hemorrhage) ([place]) 03/20/2016  . Colon polyps   . Osteoporosis   . HTN (hypertension) 01/08/2013    [facility] ,MS, [place]  04/28/2016, 11:04 AM  [facility] [address] [place], [place], [address] Phone: [phone]    Fax:  [phone]   Name: Cynthia [name] MRN: [id number] Date of Birth: [birth year]

## 2016-04-28 NOTE — Telephone Encounter (Signed)
Ok to refill??  Last office visit 04/14/2016.  Last refill 04/07/2016.

## 2016-04-28 NOTE — Telephone Encounter (Signed)
ok 

## 2016-04-28 NOTE — Telephone Encounter (Signed)
Medication called to pharmacy. 

## 2016-04-28 NOTE — Patient Instructions (Signed)
  Please complete the assigned speech therapy homework and return it to your next session.  

## 2016-04-28 NOTE — Therapy (Signed)
[facility] [address] [place], [place], [address] Phone: [phone]   Fax:  [phone]  Occupational Therapy Treatment  Patient Details  Name: Cynthia [name] MRN: [id number] Date of Birth: 1946-04-17 Referring Provider: Dr. Alysia Penna  Encounter Date: 04/28/2016      OT End of Session - 04/28/16 1002    Visit Number 4   Number of Visits 16   Date for OT Re-Evaluation 06/07/16   Authorization Type [employer] medicare - will need g code and PN every 10 visits   Authorization Time Period 60 days   Authorization - Visit Number 4   Authorization - Number of Visits 10   OT Start Time 0932   OT Stop Time 1018   OT Time Calculation (min) 46 min   Activity Tolerance Patient tolerated treatment well      Past Medical History:  Diagnosis Date  . Colon polyps   . Depression    history of  . Osteoporosis     No past surgical history on file.  There were no vitals filed for this visit.      Subjective Assessment - 04/28/16 1006    Subjective  Pt reports that she no longer bumps into doorframes.     Pertinent History see epic snapshot;  R frontal ICH possibly due to fall   Patient Stated Goals I am hoping to be able to get back to work in the fall   Currently in Pain? No/denies                      OT Treatments/Exercises (OP) - 04/28/16 0001      Cognitive Exercises   Problem Solving Simple-mod complex functional problem solving involving time.  Pt able to complete with 100% accuracy with incr time and was observed to double check work.  Pt reports that she has been impulsive at times and has been trying to check after herself.     Visual/Perceptual Exercises   Copy this Image Pegboard   Pegboard copying small peg design by putting pegs in with L hand.  Pt with min difficulty/cues for L side of design, but able to correct with min v.c., coordination L hand with min difficulty with  tremor-type movement noted., which pt reports happens with fatigue                OT Education - 04/28/16 0958    Education Details Reviewed [name] putty HEP (pt returned demo each)   Person(s) Educated Patient   Methods Explanation;Demonstration;Verbal cues   Comprehension Verbalized understanding;Returned demonstration          OT Short Term Goals - 04/26/16 1349      OT SHORT TERM GOAL #1   Title Pt and husband will be mod I with home activities program- 05/10/2016   Status On-going     OT SHORT TERM GOAL #2   Title Pt will demonstrate improved grip strength by at least 5 pounds to assist with functional tasks (baseline=45)   Status On-going     OT SHORT TERM GOAL #3   Title Patient will reduce time with 9 hole peg test in left hand by 3 seconds to improve functional use of non dominant hand with ADL/IADL   Baseline Left: 32.71 04/22/16   Status On-going     OT SHORT TERM GOAL #4   Title Pt will require no more than min questioning cues for moderately complex problem solving functional tasks  Status On-going     OT SHORT TERM GOAL #5   Title Pt will require no more than min a for alternating attention with familiar, moderately complex functional tasks   Status On-going     OT SHORT TERM GOAL #6   Title Pt will be mod I with showering   Status On-going  Need to confimr with husband, patient indicates goal met     OT SHORT TERM GOAL #7   Title Pt will be mod I with dressing   Status On-going  Need to confirm with husband     OT SHORT TERM GOAL #8   Title Pt will be mod I with toilet and shower transfers   Status On-going  Patient reports this goal is met - need to confirm with husband           OT Long Term Goals - 04/26/16 1349      OT LONG TERM GOAL #1   Title Pt and husband will be mod I with home activities program - 06/07/2016   Status On-going     OT LONG TERM GOAL #2   Title Pt wil demonstrate at least 7 pound increase in grip strength to  assist with functional tasks (baseline=45)   Status On-going     OT LONG TERM GOAL #3   Title Pt will be mod I with simple familar hot meal prep   Status On-going     OT LONG TERM GOAL #5   Title Pt will require no more than 2 cues for moderately complext problem solving functional tasks   Status On-going     OT LONG TERM GOAL #6   Title Pt will be mod I with alternating attention for moderately complext task requiring alternating attention   Status On-going               Plan - 04/28/16 1035    Clinical Impression Statement Pt is progressing towards goals.  Pt able to return demo green putty HEP and reports improvement with activities at home.   Rehab Potential Good   OT Frequency 2x / week   OT Duration 8 weeks   OT Treatment/Interventions Self-care/ADL training;DME and/or AE instruction;Neuromuscular education;Therapeutic exercise;Therapist, nutritional;Therapeutic activities;Balance training;Patient/family education;Visual/perceptual remediation/compensation;Cognitive remediation/compensation   Plan address problem solving tasks, could do cooking task with emphasis on organization and problem solving   OT Home Exercise Plan Initiated HEP for fine motor coordination, green putty HEP   Consulted and Agree with Plan of Care Patient      Patient will benefit from skilled therapeutic intervention in order to improve the following deficits and impairments:  Decreased activity tolerance, Decreased balance, Decreased coordination, Decreased cognition, Decreased strength, Impaired UE functional use, Impaired tone, Impaired vision/preception, Pain  Visit Diagnosis: Other lack of coordination  Frontal lobe and executive function deficit  Hemiplegia, unspecified affecting left nondominant side (HCC)  Visuospatial deficit    Problem List Patient Active Problem List   Diagnosis Date Noted  . Cognitive deficit due to recent stroke   . Left hemiparesis ([place]) 03/29/2016   . 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 Vascular headache   . Benign essential HTN   . Hyponatremia   . Acute blood loss anemia   . TBI (traumatic brain injury) ([place])   . Intracerebral hemorrhage 03/20/2016  . ICH (intracerebral hemorrhage) ([place]) 03/20/2016  . Colon polyps   . Osteoporosis   . HTN (hypertension) 01/08/2013    [facility] 04/28/2016, 10:40 AM  [facility]  [facility] [address] [place], [place], [address] Phone: [phone]   Fax:  [phone]  Name: Mariesha Venturella MRN: [id number] Date of Birth: 03/23/1946   Vianne Bulls, OTR/L [facility] [address] [place], [place]  [address] [phone] phone [phone] 04/28/16 10:40 AM

## 2016-05-03 ENCOUNTER — Other Ambulatory Visit: Payer: Self-pay | Admitting: Family Medicine

## 2016-05-03 ENCOUNTER — Ambulatory Visit: Payer: Medicare Other | Admitting: Occupational Therapy

## 2016-05-03 ENCOUNTER — Ambulatory Visit: Payer: Medicare Other | Admitting: Physical Therapy

## 2016-05-03 ENCOUNTER — Encounter: Payer: Self-pay | Admitting: Occupational Therapy

## 2016-05-03 DIAGNOSIS — G8194 Hemiplegia, unspecified affecting left nondominant side: Secondary | ICD-10-CM

## 2016-05-03 DIAGNOSIS — R41844 Frontal lobe and executive function deficit: Secondary | ICD-10-CM | POA: Diagnosis not present

## 2016-05-03 DIAGNOSIS — R41842 Visuospatial deficit: Secondary | ICD-10-CM

## 2016-05-03 DIAGNOSIS — R278 Other lack of coordination: Secondary | ICD-10-CM

## 2016-05-03 NOTE — Therapy (Signed)
[facility] [address] [place], [place], [address] Phone: [phone]   Fax:  [phone]  Occupational Therapy Treatment  Patient Details  Name: Cynthia [name] MRN: [id number] Date of Birth: [birth year] Referring Provider: Dr. Alysia Penna  Encounter Date: 05/03/2016      OT End of Session - 05/03/16 1355    Visit Number 5   Number of Visits 16   Date for OT Re-Evaluation 06/07/16   Authorization Type [employer] medicare - will need g code and PN every 10 visits   Authorization Time Period 60 days   Authorization - Visit Number 5   Authorization - Number of Visits 10   OT Start Time 1103   OT Stop Time 1145   OT Time Calculation (min) 42 min   Activity Tolerance Patient tolerated treatment well      Past Medical History:  Diagnosis Date  . Colon polyps   . Depression    history of  . Osteoporosis     History reviewed. No pertinent surgical history.  There were no vitals filed for this visit.      Subjective Assessment - 05/03/16 1109    Subjective  I think things are going pretty well at home - I am doing laundry, paying bills, mopping the floor.    Pertinent History see epic snapshot;  R frontal ICH possibly due to fall   Patient Stated Goals I am hoping to be able to get back to work in the fall   Currently in Pain? Yes   Pain Score 2    Pain Location Back   Pain Orientation Lower   Pain Type Chronic pain   Pain Frequency Intermittent   Aggravating Factors  first thing in the morning   Pain Relieving Factors rest, stretching, yoga, meds if it is really bad.                       OT Treatments/Exercises (OP) - 05/03/16 0001      ADLs   Cooking Addressed simple hot meal prep today (scrambled eggs).  Prior to stroke husband did majority of cooking but pt was able to complete simple hot meal prep. Pt required min vc's to turn off stove and to fully clean up (pt declared she was  done even tho dirty bowl was sitting near stove - pt wiped down stove and walked past bowl 3 times and still needed cues to complete clean up).  Discussed need for continued supervision in the kitchen for these reasons. Pt in agreement.   Work Disccussed specific cognitive skills necessary for pt's return to work - selective sustained attention, working memory, Armed forces training and education officer, alternating attention, and rate of processing.  Will attempt to simulate work tasks in clinic next session.                   OT Short Term Goals - 05/03/16 1352      OT SHORT TERM GOAL #1   Title Pt and husband will be mod I with home activities program- 05/10/2016   Status Achieved     OT SHORT TERM GOAL #2   Title Pt will demonstrate improved grip strength by at least 5 pounds to assist with functional tasks (baseline=45)   Status Achieved  achieved 60 pounds     OT SHORT TERM GOAL #3   Title Patient will reduce time with 9 hole peg test in left hand by 3 seconds to improve functional use  of non dominant hand with ADL/IADL   Baseline Left: 32.71 04/22/16   Status Achieved  28.59     OT SHORT TERM GOAL #4   Title Pt will require no more than min questioning cues for moderately complex problem solving functional tasks   Status Achieved     OT SHORT TERM GOAL #5   Title Pt will require no more than min a for alternating attention with familiar, moderately complex functional tasks   Status Achieved     OT SHORT TERM GOAL #6   Title Pt will be mod I with showering   Status Achieved  Need to confimr with husband, patient indicates goal met     OT SHORT TERM GOAL #7   Title Pt will be mod I with dressing   Status Achieved  Need to confirm with husband     OT SHORT TERM GOAL #8   Title Pt will be mod I with toilet and shower transfers   Status Achieved  Patient reports this goal is met - need to confirm with husband           OT Long Term Goals - 05/03/16 1353      OT LONG TERM GOAL #1    Title Pt and husband will be mod I with home activities program - 06/07/2016   Status On-going     OT LONG TERM GOAL #2   Title Pt wil demonstrate at least 7 pound increase in grip strength to assist with functional tasks (baseline=45)   Status Achieved  60 pounds     OT LONG TERM GOAL #3   Title Pt will be mod I with simple familar hot meal prep   Status On-going     OT LONG TERM GOAL #5   Title Pt will require no more than 2 cues for moderately complext problem solving functional tasks   Status On-going     OT LONG TERM GOAL #6   Title Pt will be mod I with alternating attention for moderately complext task requiring alternating attention   Status On-going               Plan - 05/03/16 1354    Clinical Impression Statement Pt progressing toward goals Pt with greatly improved grip strength and coordination.   Rehab Potential Good   OT Frequency 2x / week   OT Duration 8 weeks   OT Treatment/Interventions Self-care/ADL training;DME and/or AE instruction;Neuromuscular education;Therapeutic exercise;Therapist, nutritional;Therapeutic activities;Balance training;Patient/family education;Visual/perceptual remediation/compensation;Cognitive remediation/compensation   Plan work related skills, alternating attention, attention to detail, rate of processing, organization   OT Home Exercise Plan Initiated HEP for fine motor coordination, green putty HEP   Consulted and Agree with Plan of Care Patient      Patient will benefit from skilled therapeutic intervention in order to improve the following deficits and impairments:  Decreased activity tolerance, Decreased balance, Decreased coordination, Decreased cognition, Decreased strength, Impaired UE functional use, Impaired tone, Impaired vision/preception, Pain  Visit Diagnosis: Other lack of coordination  Frontal lobe and executive function deficit  Hemiplegia, unspecified affecting left nondominant side  (HCC)  Visuospatial deficit    Problem List Patient Active Problem List   Diagnosis Date Noted  . Cognitive deficit due to recent stroke   . Left hemiparesis ([place]) 03/29/2016  . 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 Vascular headache   . Benign essential HTN   . Hyponatremia   . Acute blood loss anemia   . TBI (traumatic brain injury) (  [place])   . Intracerebral hemorrhage 03/20/2016  . ICH (intracerebral hemorrhage) ([place]) 03/20/2016  . Colon polyps   . Osteoporosis   . HTN (hypertension) 01/08/2013    Quay Burow, OTR/L 05/03/2016, 1:56 PM  [facility] [address] [place], [place], [address] Phone: [phone]   Fax:  [phone]  Name: Cynthia [name] MRN: [id number] Date of Birth: [birth year]

## 2016-05-05 ENCOUNTER — Ambulatory Visit: Payer: Medicare Other | Admitting: Physical Therapy

## 2016-05-05 ENCOUNTER — Encounter: Payer: Self-pay | Admitting: Family Medicine

## 2016-05-05 ENCOUNTER — Encounter: Payer: Self-pay | Admitting: Occupational Therapy

## 2016-05-05 ENCOUNTER — Ambulatory Visit (INDEPENDENT_AMBULATORY_CARE_PROVIDER_SITE_OTHER): Payer: Medicare Other | Admitting: Family Medicine

## 2016-05-05 ENCOUNTER — Ambulatory Visit: Payer: Medicare Other | Admitting: Occupational Therapy

## 2016-05-05 VITALS — BP 146/70 | HR 84 | Temp 98.1°F | Resp 14 | Ht 66.5 in | Wt 148.0 lb

## 2016-05-05 DIAGNOSIS — R41844 Frontal lobe and executive function deficit: Secondary | ICD-10-CM | POA: Diagnosis not present

## 2016-05-05 DIAGNOSIS — I10 Essential (primary) hypertension: Secondary | ICD-10-CM | POA: Diagnosis not present

## 2016-05-05 DIAGNOSIS — R278 Other lack of coordination: Secondary | ICD-10-CM

## 2016-05-05 DIAGNOSIS — R41842 Visuospatial deficit: Secondary | ICD-10-CM

## 2016-05-05 NOTE — Progress Notes (Signed)
Subjective:    Patient ID: Cynthia [name], female    DOB: 1946-01-23, 70 y.o.   MRN: [id number]  HPI  04/14/16 Patient was admitted to the hospital July 9 with difficulty finding words and neglect of her left leg and some left-sided weakness. CT scan revealed a large right frontal intracranial hemorrhage. I have copied relevant portions of the discharge summary and included them below for my reference: Admit date: 03/20/2016 Discharge date: 03/24/2016  Admission Diagnoses: Headache and word finding difficulty  Discharge Diagnoses: Right frontal lobar intracerebral hemorrhage of indeterminate etiology. Mild left hemiparesis and speech difficulties Active Problems:   Intracerebral hemorrhage   ICH (intracerebral hemorrhage) (HCC)   Vascular headache   Benign essential HTN   Hyponatremia   Acute blood loss anemia   TBI (traumatic brain injury) ([place]) Mild cytotoxic edema  Discharged Condition: good  Hospital Course: 70 year old right-handed woman who presented to the [facility] Emergency department after being sent from her doctor's office due to concern for possible stroke.The patient  reported that the night of 03/19/2016 (LKW, time unknown) she developed a sense of feeling disoriented and having difficulty speaking. When asked about her speech, she described that she had trouble focusing her thoughts and getting her words out. She reported that her sister also noticed that she seemed slow to respond to questions. She felt as if her speech was somewhat slurred as well. She did not endorse any weakness, numbness, vision changes, difficulty swallowing, or balance impairments. However, she had noticed that she will find her left arm in unusual postures and positions without being aware that it is doing anything. When she looked at it, she was able to control it without a problem and again denied any weakness or sensory changes. She  also had a headache for the past 2 days.  She denies any  similar previous episodes. She denied any recent trauma to the head. She did note that she was working with her brother at her home and had been up on a ladder. She was getting off the ladder, she thought she was on the ground but instead was on the second step of the latter when she stumbled backwards. She says that she struck her bottom on a nearby chair, then fell to the floor landing on her bottom. She did not strike her head. She has not had any recent illness. She does not use daily antiplatelet therapy or anticoagulation. She uses occasional Advil for headache and pain and has taken a total of six 200 mg tablets since last night. She also reports that she took a baby aspirin earlier today as recommended by her sister because of her symptoms.CT brain on admission showed a large right frontal ICH. Patient was not administered IV t-PA secondary to [place]. She was admitted to the neuro ICU for further evaluation and treatment. Her blood pressure was tightly controlled initially with IV drip and subsequently with oral medications. Follow-up CT scan showed stable appearance of the hemorrhage. There was mild cytotoxic edema but no hydrocephalus or any hemorrhage expansion. CT angiogram and CT venogram did not show any aneurysms or venous sinus thrombosis but there are no engorged vessels in the region of the hemorrhage raising concern about a small AVM hence cerebral catheter angiogram was performed and findings of which are also indeterminate and showed abnormal right frontopolar branches .She had persistent left-sided weakness but her speech and word finding difficulties improved. She was transferred to the neurology floor in a condition remained stable.  She was seen by physical occupational therapist and felt to be a good candidate for inpatient rehabilitation. She was accepted for transfer to rehabilitation in stable condition.   Consults: rehabilitation medicine and ointerventional radiology  Significant  Diagnostic Studies:   CT R frontal lobe ICH, 20 mL, possible trace SDH  MRI R posterior frontal lobe ICH, 70-68 days old. No other blood products seen.  CTA head and neck No avm/aneurysm  CT venogram no sinus thrombosis  Cerebral angio No definite AVM or aneurysm noted but abnormal right frontopolar branches raises the question of possible small AVM  LDL 84  HgbA1c  5.3  She is here today for follow-up. She is doing remarkably well. She is walking without difficulty. She is in physical therapy. At the present time she is having no difficulty speaking. She denies any difficulty with short-term or long-term memory. She denies any personality changes. Her biggest concern is what caused this and how to prevent it in the future. She was still taking meloxicam. She is on no other agent that within her blood. Her blood pressure today was extremely elevated on intake. However after letting the patient sit comfortably for a few minutes, her blood pressure fell to 148/90.  AT that time, my plan was: At the time of her initial stroke, angiogram revealed no specific cause for the intracranial hemorrhage. Neurology has recommended repeating the test in 2 months. Patient is scheduled to follow with a neurologist in one month. In the meantime I want her to avoid any agents that can increase her risk of bleeding. I recommended that she stay away from meloxicam. She can use Tylenol if necessary for pain. I recommended avoiding I loosely aspirin or any other agent such as fish oil. Mother primary concern is her blood pressure. Even after calming down, her blood pressure is elevated today. She will check her blood pressure frequently over the weekend and call me with the values on Monday. Her goal blood pressures less than 130/80. If above this, I will start the patient on an angiotensin receptor blocker.  05/05/16 Patient is here today for follow-up. Ultimately started losartan 50 mg by mouth daily. Lead pressure at  home has been averaging between 115 and 130/70-80. She is tolerating the medication well with no side effects Past Medical History:  Diagnosis Date  . Colon polyps   . Depression    history of  . Osteoporosis    No past surgical history on file. Current Outpatient Prescriptions on File Prior to Visit  Medication Sig Dispense Refill  . ALPRAZolam (XANAX) 0.25 MG tablet TAKE 1 TABLET BY MOUTH 3 TIMES A DAY AS NEEDED 90 tablet 0  . Calcium Carbonate-Vitamin D (OS-CAL 500 + D PO) Take 1 tablet by mouth daily.     . cetirizine (ZYRTEC) 10 MG tablet Take 10 mg by mouth daily as needed for allergies.     Marland Kitchen EPIPEN 2-PAK 0.3 MG/0.3ML SOAJ injection INJECT 0.3 MLS (0.3 MG TOTAL) INTO THE MUSCLE ONCE. 2 Device 0  . famotidine (PEPCID) 20 MG tablet TAKE 1 TABLET (20 MG TOTAL) BY MOUTH 2 (TWO) TIMES DAILY. 60 tablet 11  . ferrous sulfate 325 (65 FE) MG tablet Take 325 mg by mouth daily as needed (to improve hemoglobin).     Marland Kitchen losartan (COZAAR) 50 MG tablet Take 1 tablet (50 mg total) by mouth daily. 30 tablet 3   No current facility-administered medications on file prior to visit.    Allergies  Allergen Reactions  .  Other Anaphylaxis    FIRE ANTS  . Penicillins Rash    Has patient had a PCN reaction causing immediate rash, facial/tongue/throat swelling, SOB or lightheadedness with hypotension: Unknown Has patient had a PCN reaction causing severe rash involving mucus membranes or skin necrosis: No Has patient had a PCN reaction that required hospitalization No Has patient had a PCN reaction occurring within the last 10 years: No If all of the above answers are "NO", then may proceed with Cephalosporin use.    Social History   Social History  . Marital status: Married    Spouse name: N/A  . Number of children: N/A  . Years of education: N/A   Occupational History  . Not on file.   Social History Main Topics  . Smoking status: Never Smoker  . Smokeless tobacco: Never Used  . Alcohol  use No     Comment: 1-2 beers/wine in evening  . Drug use: No  . Sexual activity: Not on file     Comment: Married since 2005   Other Topics Concern  . Not on file   Social History Narrative  . No narrative on file     Review of Systems  All other systems reviewed and are negative.      Objective:   Physical Exam  Constitutional: She is oriented to person, place, and time.  Cardiovascular: Normal rate, regular rhythm and normal heart sounds.   Pulmonary/Chest: Effort normal and breath sounds normal. No respiratory distress. She has no wheezes. She has no rales.  Abdominal: Soft. Bowel sounds are normal. She exhibits no distension. There is no tenderness. There is no rebound.  Neurological: She is alert and oriented to person, place, and time. She has normal reflexes. No cranial nerve deficit. She exhibits normal muscle tone. Coordination normal.  Psychiatric: She has a normal mood and affect. Her behavior is normal. Judgment and thought content normal.  Vitals reviewed.         Assessment & Plan:  Essential hypertension  Blood pressure is much better controlled. Continue losartan 50 mg by mouth daily. Recheck in 6 months

## 2016-05-05 NOTE — Patient Instructions (Signed)
Pt given written passages to familiarize herself with as well as to develop lesson plan based on passages.

## 2016-05-05 NOTE — Therapy (Signed)
[facility] [address] [place], [place], [address] Phone: [phone]   Fax:  [phone]  Occupational Therapy Treatment  Patient Details  Name: Cynthia [name] MRN: [id number] Date of Birth: 04/20/1946 Referring Provider: Dr. Alysia Penna  Encounter Date: 05/05/2016      OT End of Session - 05/05/16 1203    Visit Number 6   Number of Visits 16   Date for OT Re-Evaluation 06/07/16   Authorization Type [employer] medicare - will need g code and PN every 10 visits   Authorization Time Period 60 days   Authorization - Visit Number 6   Authorization - Number of Visits 10   OT Start Time [phone]  pt arrived late   OT Stop Time 0845   OT Time Calculation (min) 37 min   Activity Tolerance Patient tolerated treatment well      Past Medical History:  Diagnosis Date  . Colon polyps   . Depression    history of  . Osteoporosis     History reviewed. No pertinent surgical history.  There were no vitals filed for this visit.      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    Pertinent History see epic snapshot;  R frontal ICH possibly due to fall   Patient Stated Goals I am hoping to be able to get back to work in the fall   Currently in Pain? 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 Pt is responsible for listening to students read and marking errors for assessment (auditory processing, auditory attention, dividied attention to make notations while listening to child read) for assessment as well as developing lesson plans for group instruction based on assessment (same skills as well as problem solving, organization and rate of processing info).   Task modified to smaller passages of text.  Pt with approximately 70% accuracy.  Pt reports if necessary she can have peer mentor when she returns to work to ensure accuracy.                  OT Short Term Goals - 05/05/16 1201      OT SHORT TERM GOAL #1   Title Pt and husband will be mod I with home activities program- 05/10/2016   Status Achieved     OT SHORT TERM GOAL #2   Title Pt will demonstrate improved grip strength by at least 5 pounds to assist with functional tasks (baseline=45)   Status Achieved  achieved 60 pounds     OT SHORT TERM GOAL #3   Title Patient will reduce time with 9 hole peg test in left hand by 3 seconds to improve functional use of non dominant hand with ADL/IADL   Baseline Left: 32.71 04/22/16   Status Achieved  28.59     OT SHORT TERM GOAL #4   Title Pt will require no more than min questioning cues for moderately complex problem solving functional tasks   Status Achieved     OT SHORT TERM GOAL #5   Title Pt will require no more than min a  for alternating attention with familiar, moderately complex functional tasks   Status Achieved     OT SHORT TERM GOAL #6   Title Pt will be mod I with showering   Status Achieved  Need to confimr with husband, patient indicates goal met     OT SHORT TERM GOAL #7   Title Pt will be mod I with dressing   Status Achieved  Need to confirm with husband     OT SHORT TERM GOAL #8   Title Pt will be mod I with toilet and shower transfers   Status Achieved  Patient reports this goal is met - need to confirm with husband           OT Long Term Goals - 05/05/16 1201      [place] #1   Title Pt and husband will be mod I with home activities program - 06/07/2016   Status On-going     OT LONG TERM GOAL #2   Title Pt wil demonstrate at least 7 pound increase in grip strength to assist with functional tasks (baseline=45)   Status Achieved  60 pounds     OT LONG TERM GOAL #3   Title Pt will  be mod I with simple familar hot meal prep   Status On-going     OT LONG TERM GOAL #5   Title Pt will require no more than 2 cues for moderately complext problem solving functional tasks   Status On-going     OT LONG TERM GOAL #6   Title Pt will be mod I with alternating attention for moderately complext task requiring alternating attention   Status On-going               Plan - 05/05/16 1201    Clinical Impression Statement Pt progressing toward goals. Focus on attentional and auditory processing skills.   Rehab Potential Good   OT Frequency 2x / week   OT Duration 8 weeks   OT Treatment/Interventions Self-care/ADL training;DME and/or AE instruction;Neuromuscular education;Therapeutic exercise;Therapist, nutritional;Therapeutic activities;Balance training;Patient/family education;Visual/perceptual remediation/compensation;Cognitive remediation/compensation   Plan continue with work related skill, auditory attention and processing, attention to detail, rate of processing, organization, problem solving   Consulted and Agree with Plan of Care Patient      Patient will benefit from skilled therapeutic intervention in order to improve the following deficits and impairments:  Decreased activity tolerance, Decreased balance, Decreased coordination, Decreased cognition, Decreased strength, Impaired UE functional use, Impaired tone, Impaired vision/preception, Pain  Visit Diagnosis: Frontal lobe and executive function deficit  Other lack of coordination  Visuospatial deficit    Problem List Patient Active Problem List   Diagnosis Date Noted  . Cognitive deficit due to recent stroke   . Left hemiparesis ([place]) 03/29/2016  . 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 Vascular headache   . Benign essential HTN   . Hyponatremia   . Acute blood loss anemia   . TBI (traumatic brain injury) ([place])   . Intracerebral hemorrhage 03/20/2016  . ICH (intracerebral hemorrhage) ([place])  03/20/2016  . Colon polyps   . Osteoporosis   . HTN (hypertension) 01/08/2013    Quay Burow, OTR/L 05/05/2016, 12:05 PM  [facility] [address] [place], [place], [address] Phone: [phone]   Fax:  [phone]  Name: Cynthia [name] MRN: [id number] Date of Birth: 12/03/1945

## 2016-05-06 ENCOUNTER — Ambulatory Visit: Payer: Medicare Other

## 2016-05-06 DIAGNOSIS — R41844 Frontal lobe and executive function deficit: Secondary | ICD-10-CM | POA: Diagnosis not present

## 2016-05-06 DIAGNOSIS — R41841 Cognitive communication deficit: Secondary | ICD-10-CM

## 2016-05-06 NOTE — Therapy (Signed)
[facility] [address] [place], [place], [address] Phone: [phone]   Fax:  [phone]  Speech Language Pathology Treatment  Patient Details  Name: Cynthia [name] MRN: [id number] Date of Birth: [birth year] Referring Provider: Alysia Penna  Encounter Date: 05/06/2016      End of Session - 05/06/16 0937    Visit Number 6   Number of Visits 24   Date for SLP Re-Evaluation 07/08/16   SLP Start Time 0854   SLP Stop Time  0930  pt 10 minutes late   SLP Time Calculation (min) 36 min   Activity Tolerance Patient tolerated treatment well      Past Medical History:  Diagnosis Date  . Colon polyps   . Depression    history of  . Osteoporosis     No past surgical history on file.  There were no vitals filed for this visit.      Subjective Assessment - 05/06/16 0900    Subjective Pt provided homework for SLP immediately.   Currently in Pain? No/denies               ADULT SLP TREATMENT - 05/06/16 0907      General Information   Behavior/Cognition Alert;Cooperative;Pleasant mood     Treatment Provided   Treatment provided Cognitive-Linquistic     Cognitive-Linquistic Treatment   Treatment focused on Cognition   Skilled Treatment Alternating attention between two mod complex liguistic tasks was WNL for finding appropriate place.  Pt followed multi-step multi unit directions with modified independence (request for repeats), 95%.     Assessment / Recommendations / Plan   Plan Continue with current plan of care     Progression Toward Goals   Progression toward goals Progressing toward goals            SLP Short Term Goals - 05/06/16 [id number]      SLP SHORT TERM GOAL #1   Title pt will alternate attention between or within two mod complex linguistic tasks with 85% success over three sessions   Status Achieved     SLP SHORT TERM GOAL #2   Title pt will follow verbally given multi-unit  multistep commands with modified independence PRN (requesting repeats, etc.) over two sessions   Baseline 05-06-16- one session   Time 2   Period Weeks   Status Revised          SLP Long Term Goals - 05/06/16 [id number]      SLP LONG TERM GOAL #1   Title pt will demo improved performance on the Attention Processing Test Exercise X, when given 1-2 second delays between stimuli   Time 6   Period Weeks   Status On-going     SLP LONG TERM GOAL #2   Title pt will demo divided attention between two mod complex linguistic tasks to produce 95% on both tasks over three sessions   Time 6   Period Weeks   Status On-going          Plan - 05/06/16 [id number]    Clinical Impression Statement Pt alternated attention today WNL. 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    Speech Therapy Frequency 3x / week   Duration --  6 weeks   Treatment/Interventions Language facilitation;Cognitive reorganization;Compensatory techniques;Internal/external aids;SLP instruction and feedback;Functional tasks;Patient/family education;Environmental controls   Potential to Achieve Goals Good      Patient will benefit from  skilled therapeutic intervention in order to improve the following deficits and impairments:   Cognitive communication deficit    Problem List Patient Active Problem List   Diagnosis Date Noted  . Cognitive deficit due to recent stroke   . Left hemiparesis ([place]) 03/29/2016  . 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 Vascular headache   . Benign essential HTN   . Hyponatremia   . Acute blood loss anemia   . TBI (traumatic brain injury) ([place])   . Intracerebral hemorrhage 03/20/2016  . ICH (intracerebral hemorrhage) ([place]) 03/20/2016  . Colon polyps   . Osteoporosis   . HTN (hypertension) 01/08/2013    [facility] ,MS, [place]  05/06/2016, 9:38 AM  [facility] [address] [place], [place], [address] Phone: [phone]   Fax:  [phone]   Name: Cynthia [name] MRN: [id number] Date of Birth: 05/23/1946

## 2016-05-10 ENCOUNTER — Ambulatory Visit: Payer: Medicare Other | Admitting: Physical Therapy

## 2016-05-10 ENCOUNTER — Ambulatory Visit: Payer: Medicare Other

## 2016-05-10 ENCOUNTER — Encounter: Payer: Self-pay | Admitting: Occupational Therapy

## 2016-05-10 ENCOUNTER — Ambulatory Visit: Payer: Medicare Other | Admitting: Occupational Therapy

## 2016-05-10 DIAGNOSIS — R278 Other lack of coordination: Secondary | ICD-10-CM

## 2016-05-10 DIAGNOSIS — R41844 Frontal lobe and executive function deficit: Secondary | ICD-10-CM

## 2016-05-10 DIAGNOSIS — R41841 Cognitive communication deficit: Secondary | ICD-10-CM

## 2016-05-10 DIAGNOSIS — R41842 Visuospatial deficit: Secondary | ICD-10-CM

## 2016-05-10 DIAGNOSIS — M6281 Muscle weakness (generalized): Secondary | ICD-10-CM

## 2016-05-10 DIAGNOSIS — G8194 Hemiplegia, unspecified affecting left nondominant side: Secondary | ICD-10-CM

## 2016-05-10 NOTE — Patient Instructions (Signed)
Local Driver Evaluation Programs: ° °Comprehensive Evaluation: includes clinical and in vehicle behind the wheel testing by OCCUPATIONAL THERAPIST. Programs have varying levels of adaptive controls available for trial.  ° °[facility] °[address] °[place], [place]  [address] °[phone] or [phone] °http://www.driver-rehab.com °Evaluator:  Cyndee Crompton, OT/CDRS/CDI/SCDCM/Low Vision Certification ° °[facility] °[address] °[place], [place] [address] °[phone] °https://www.[facility].org/home/services/rehabilitation.aspx °Evaluators:  Shannon Sheek, OT and Jill Tucker, OT ° °[facility] [place] [facility] (ONLY SERVES VETERANS!!) °[facility] °[address] °[place], [place]  [address] °[phone] °http://www.[place].va.gov/services/Physical_Medicine_Rehabilitation_Services.asp °Evaluators:  Eric Andrews, KT; Heidi Harris, KT;  Gary Whitaker, KT (KT=kiniesotherapist) ° ° °Clinical evaluations only:  Includes clinical testing, refers to other programs or local certified driving instructor for behind the wheel testing. ° °[facility] (outpatient Rehab) °[facility] °[address] °[place], [place] [address] °[phone] for scheduling °http://[email or web address].edu/Outpatient-Rehabilitation/Neurorehabilitation-Therapy.htm °Evaluators:  Kelly Lambeth, OT; Kate Phillips, OT ° °Other area clinical evaluators available upon request including [facility], [facility] and [facility]. ° ° °    Resource List °What is a Driver Evaluation: °Your Road Ahead - A Guide to Comprehensive Driving Evaluations °http://www.thehartford.com/resources/mature-market-excellence/publications-on-aging ° °Association for [employer] - Disability and Driving Fact Sheets °http://www.aded.net/?page=510 ° °Driving after a Brain  Injury: °[employer] °http://www.biausa.org/tbims-abstracts/if-there-is-an-effective-way-to-determine-if-someone-is-ready-to-drive-after-tbi?A=SearchResult&SearchID=9495675&ObjectID=2758842&ObjectType=35 ° °Driving with Adaptive Equipment: °[facility] Process °http://www.driver-rehab.com/adaptive-equipment ° °[employer] °http://www.nmeda.com/ ° ° ° ° ° ° °  °

## 2016-05-10 NOTE — Patient Instructions (Signed)
  Target Area 2: Morphemic Usage, Ex. 4 directions: Have your husband read the sentence to you. You figure out what's wrong in the sentence and rewrite it correctly. Try to NOT have him repeat the sentence.

## 2016-05-10 NOTE — Therapy (Signed)
[facility] [address] [place], [place], [address] Phone: [phone]   Fax:  [phone]  Occupational Therapy Treatment  Patient Details  Name: Cynthia [name] MRN: [id number] Date of Birth: 03-Aug-1946 Referring Provider: Dr. Alysia Penna  Encounter Date: 05/10/2016      OT End of Session - 05/10/16 1620    Visit Number 7   Number of Visits 16   Date for OT Re-Evaluation 06/07/16   Authorization Type [employer] medicare - will need g code and PN every 10 visits   Authorization Time Period 60 days   Authorization - Visit Number 7   Authorization - Number of Visits 10   OT Start Time 1402   OT Stop Time 1445   OT Time Calculation (min) 43 min   Activity Tolerance Patient tolerated treatment well      Past Medical History:  Diagnosis Date  . Colon polyps   . Depression    history of  . Osteoporosis     History reviewed. No pertinent surgical history.  There were no vitals filed for this visit.      Subjective Assessment - 05/10/16 1406    Subjective   I forgot my notebook.   Patient is accompained by: Family member   Pertinent History see epic snapshot;  R frontal ICH possibly due to fall   Patient Stated Goals I am hoping to be able to get back to work in the fall   Currently in Pain? No/denies                      OT Treatments/Exercises (OP) - 05/10/16 0001      ADLs   Work Continue to address work related skills including divided attention, altenating attention, selective attention, working memory, rate of processing of information.  Pt with approximately 30% error rate with audiory processing tasks.                   OT Short Term Goals - 05/10/16 1617      OT SHORT TERM GOAL #1   Title Pt and husband will be mod I with home activities program- 05/10/2016   Status Achieved     OT SHORT TERM GOAL #2   Title Pt will demonstrate improved grip strength by at least 5  pounds to assist with functional tasks (baseline=45)   Status Achieved  achieved 60 pounds     OT SHORT TERM GOAL #3   Title Patient will reduce time with 9 hole peg test in left hand by 3 seconds to improve functional use of non dominant hand with ADL/IADL   Baseline Left: 32.71 04/22/16   Status Achieved  28.59     OT SHORT TERM GOAL #4   Title Pt will require no more than min questioning cues for moderately complex problem solving functional tasks   Status Achieved     OT SHORT TERM GOAL #5   Title Pt will require no more than min a for alternating attention with familiar, moderately complex functional tasks   Status Achieved     OT SHORT TERM GOAL #6   Title Pt will be mod I with showering   Status Achieved     OT SHORT TERM GOAL #7   Title Pt will be mod I with dressing   Status Achieved     OT SHORT TERM GOAL #8   Title Pt will be mod I with toilet and shower transfers  Status Achieved           OT Long Term Goals - 05/10/16 1618      OT LONG TERM GOAL #1   Title Pt and husband will be mod I with home activities program - 06/07/2016   Status On-going     OT LONG TERM GOAL #2   Title Pt wil demonstrate at least 7 pound increase in grip strength to assist with functional tasks (baseline=45)   Status Achieved  60 pounds     OT LONG TERM GOAL #3   Title Pt will be mod I with simple familar hot meal prep   Status On-going     OT LONG TERM GOAL #5   Title Pt will require no more than 2 cues for moderately complext problem solving functional tasks   Status On-going     OT LONG TERM GOAL #6   Title Pt will be mod I with alternating attention for moderately complext task requiring alternating attention   Status On-going               Plan - 05/10/16 1619    Clinical Impression Statement Pt progressing toward goals. Pt notes that when she is tired it is harder to concentrate. Pt with improving insight.   Rehab Potential Good   OT Frequency 2x / week    OT Duration 8 weeks   OT Treatment/Interventions Self-care/ADL training;DME and/or AE instruction;Neuromuscular education;Therapeutic exercise;Therapist, nutritional;Therapeutic activities;Balance training;Patient/family education;Visual/perceptual remediation/compensation;Cognitive remediation/compensation   Plan continue with work relatd skills (auditory attention and processing, attention to detail, rate of processing, divided attention, organization,problem solving   Consulted and Agree with Plan of Care Patient      Patient will benefit from skilled therapeutic intervention in order to improve the following deficits and impairments:  Decreased activity tolerance, Decreased balance, Decreased coordination, Decreased cognition, Decreased strength, Impaired UE functional use, Impaired tone, Impaired vision/preception, Pain  Visit Diagnosis: Frontal lobe and executive function deficit  Other lack of coordination  Visuospatial deficit  Hemiplegia, unspecified affecting left nondominant side (HCC)  Muscle weakness (generalized)    Problem List Patient Active Problem List   Diagnosis Date Noted  . Cognitive deficit due to recent stroke   . Left hemiparesis ([facility]) 03/29/2016  . 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 Vascular headache   . Benign essential HTN   . Hyponatremia   . Acute blood loss anemia   . TBI (traumatic brain injury) ([facility])   . Intracerebral hemorrhage 03/20/2016  . ICH (intracerebral hemorrhage) ([place]) 03/20/2016  . Colon polyps   . Osteoporosis   . HTN (hypertension) 01/08/2013    Quay Burow, OTR/L 05/10/2016, 4:30 PM  [facility] [address] [place], [place], [address] Phone: [phone]   Fax:  [phone]  Name: Cynthia [name] MRN: [id number] Date of Birth: [birth year]

## 2016-05-11 ENCOUNTER — Ambulatory Visit: Payer: Medicare Other | Admitting: Occupational Therapy

## 2016-05-11 ENCOUNTER — Ambulatory Visit: Payer: Medicare Other | Admitting: Physical Therapy

## 2016-05-11 ENCOUNTER — Ambulatory Visit: Payer: Medicare Other

## 2016-05-11 ENCOUNTER — Encounter: Payer: Self-pay | Admitting: Occupational Therapy

## 2016-05-11 DIAGNOSIS — R41844 Frontal lobe and executive function deficit: Secondary | ICD-10-CM | POA: Diagnosis not present

## 2016-05-11 DIAGNOSIS — R41842 Visuospatial deficit: Secondary | ICD-10-CM

## 2016-05-11 DIAGNOSIS — M6281 Muscle weakness (generalized): Secondary | ICD-10-CM

## 2016-05-11 DIAGNOSIS — G8194 Hemiplegia, unspecified affecting left nondominant side: Secondary | ICD-10-CM

## 2016-05-11 DIAGNOSIS — R278 Other lack of coordination: Secondary | ICD-10-CM

## 2016-05-11 DIAGNOSIS — R41841 Cognitive communication deficit: Secondary | ICD-10-CM

## 2016-05-11 NOTE — Therapy (Signed)
[facility] [address] [place], [place], [address] Phone: [phone]   Fax:  [phone]  Speech Language Pathology Treatment  Patient Details  Name: Cynthia [name] MRN: [id number] Date of Birth: February 29, 1946 Referring Provider: Alysia Penna  Encounter Date: 05/11/2016      End of Session - 05/11/16 1449    Visit Number 8   Number of Visits 24   Date for SLP Re-Evaluation 07/08/16   SLP Start Time 68   SLP Stop Time  1448   SLP Time Calculation (min) 46 min      Past Medical History:  Diagnosis Date  . Colon polyps   . Depression    history of  . Osteoporosis     No past surgical history on file.  There were no vitals filed for this visit.      Subjective Assessment - 05/11/16 1419    Subjective Pt provided all homework to SLP.               ADULT SLP TREATMENT - 05/11/16 1426      General Information   Behavior/Cognition Alert;Cooperative;Pleasant mood     Treatment Provided   Treatment provided Cognitive-Linquistic     Cognitive-Linquistic Treatment   Treatment focused on Cognition   Skilled Treatment SLP facilitated pt's divided attention skills today - pt with 55% alternating and 45% divided attention with a detailed written task and SLP spelling 4 letter words and providing </= 5 piece coin combinations.      Assessment / Recommendations / Plan   Plan Continue with current plan of care     Progression Toward Goals   Progression toward goals Progressing toward goals            SLP Short Term Goals - 05/11/16 1449      SLP SHORT TERM GOAL #1   Title pt will alternate attention between or within two mod complex linguistic tasks with 85% success over three sessions   Status Achieved     SLP SHORT TERM GOAL #2   Title pt will follow verbally given multi-unit multistep commands with modified independence PRN (requesting repeats, etc.) over two sessions   Baseline  05-06-16- one session   Time 1   Period Weeks   Status Revised          SLP Long Term Goals - 05/11/16 1449      SLP LONG TERM GOAL #1   Title pt will demo improved performance on the Attention Processing Test Exercise X, when given 1-2 second delays between stimuli   Time 5   Period Weeks   Status On-going     SLP LONG TERM GOAL #2   Title pt will demo divided attention between two mod complex linguistic tasks to produce 95% on both tasks over three sessions   Time 5   Period Weeks   Status On-going          Plan - 05/11/16 1449    Clinical Impression Statement High level attention and auditory comprehension impairments remain - 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    Speech Therapy Frequency 3x / week   Duration --  5 weeks   Treatment/Interventions Language facilitation;Cognitive reorganization;Compensatory techniques;Internal/external aids;SLP instruction and feedback;Functional tasks;Patient/family education;Environmental controls   Potential to Achieve Goals Good      Patient will benefit from skilled therapeutic intervention in order to improve the following deficits and impairments:  Cognitive communication deficit    Problem List Patient Active Problem List   Diagnosis Date Noted  . Cognitive deficit due to recent stroke   . Left hemiparesis ([facility]) 03/29/2016  . 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 Vascular headache   . Benign essential HTN   . Hyponatremia   . Acute blood loss anemia   . TBI (traumatic brain injury) ([facility])   . Intracerebral hemorrhage 03/20/2016  . ICH (intracerebral hemorrhage) ([facility]) 03/20/2016  . Colon polyps   . Osteoporosis   . HTN (hypertension) 01/08/2013    [facility] ,[place], [place]  05/11/2016, 2:50 PM  [facility] [address] [place], [place], [address] Phone: [phone]   Fax:   [phone]   Name: Cynthia [name] MRN: [id number] Date of Birth: 05-07-1946

## 2016-05-11 NOTE — Therapy (Signed)
[facility] [address] [place], [place], [address] Phone: [phone]   Fax:  [phone]  Speech Language Pathology Treatment  Patient Details  Name: Cynthia [name] MRN: [id number] Date of Birth: 1946/08/19 Referring Provider: Alysia Penna  Encounter Date: 05/10/2016      End of Session - 05/10/16 1623    Visit Number 7   Number of Visits 24   Date for SLP Re-Evaluation 07/08/16   SLP Start Time 33   SLP Stop Time  1400   SLP Time Calculation (min) 42 min   Activity Tolerance Patient tolerated treatment well      Past Medical History:  Diagnosis Date  . Colon polyps   . Depression    history of  . Osteoporosis     No past surgical history on file.  There were no vitals filed for this visit.      Subjective Assessment - 05/10/16 1323    Subjective Pt misplaced homework.   Currently in Pain? No/denies               ADULT SLP TREATMENT - 05/11/16 0001      General Information   Behavior/Cognition Alert;Cooperative;Pleasant mood     Treatment Provided   Treatment provided Cognitive-Linquistic     Cognitive-Linquistic Treatment   Treatment focused on Cognition   Skilled Treatment Mod complex verbal questions with comparitives completed with 100% success and extra time 10% of the questions. In mod complex directions provided verbally pt correctly responded 8/10 without awareness of incorrect response. 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, theatre/television/film, Research scientist (medical), etc)     Assessment / Recommendations / Plan   Plan Continue with current plan of care     Progression Toward Goals   Progression toward goals Progressing toward goals             SLP Short Term Goals - 05/10/16 1625      SLP SHORT TERM GOAL #1   Title pt will alternate attention between or within two mod complex linguistic tasks with 85% success over three sessions   Status Achieved     SLP [place] #2   Title pt will follow verbally given multi-unit multistep commands with modified independence PRN (requesting repeats, etc.) over two sessions   Baseline 05-06-16- one session   Time 1   Period Weeks   Status Revised          SLP Long Term Goals - 05/10/16 1333      SLP LONG TERM GOAL #1   Title pt will demo improved performance on the Attention Processing Test Exercise X, when given 1-2 second delays between stimuli   Time 5   Period Weeks   Status On-going     SLP LONG TERM GOAL #2   Title pt will demo divided attention between two mod complex linguistic tasks to produce 95% on both tasks over three sessions   Time 5   Period Weeks   Status On-going          Plan - 05/10/16 1624    Clinical Impression Statement High level attention and auditory comprehension impairments remain - 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  Speech Therapy Frequency 3x / week   Duration --  5 weeks   Treatment/Interventions Language facilitation;Cognitive reorganization;Compensatory techniques;Internal/external aids;SLP instruction and feedback;Functional tasks;Patient/family education;Environmental controls   Potential to Achieve Goals Good      Patient will benefit from skilled therapeutic intervention in order to improve the following deficits and impairments:   Cognitive communication deficit    Problem List Patient Active Problem List   Diagnosis Date Noted  . Cognitive deficit due to recent stroke   . Left hemiparesis ([facility]) 03/29/2016  . 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 Vascular headache   . Benign essential HTN   . Hyponatremia   . Acute blood loss anemia   . TBI  (traumatic brain injury) ([facility])   . Intracerebral hemorrhage 03/20/2016  . ICH (intracerebral hemorrhage) ([facility]) 03/20/2016  . Colon polyps   . Osteoporosis   . HTN (hypertension) 01/08/2013    [facility] ,[place], [place]  05/11/2016, 9:28 AM  [facility] [address] [place], [place], [address] Phone: [phone]   Fax:  [phone]   Name: Cynthia [name] MRN: [id number] Date of Birth: 1946/04/08

## 2016-05-11 NOTE — Therapy (Signed)
[facility] [address] [place], [place], [address] Phone: [phone]   Fax:  [phone]  Occupational Therapy Treatment  Patient Details  Name: Cynthia [name] MRN: [id number] Date of Birth: 08/28/1946 Referring Provider: Dr. Alysia Penna  Encounter Date: 05/11/2016      OT End of Session - 05/11/16 1933    Visit Number 8   Number of Visits 16   Date for OT Re-Evaluation 06/07/16   Authorization Type [employer] medicare - will need g code and PN every 10 visits   Authorization Time Period 60 days   Authorization - Visit Number 8   Authorization - Number of Visits 10   OT Start Time [id number]   OT Stop Time 1617   OT Time Calculation (min) 44 min   Activity Tolerance Patient tolerated treatment well   Behavior During Therapy [facility] for tasks assessed/performed      Past Medical History:  Diagnosis Date  . Colon polyps   . Depression    history of  . Osteoporosis     History reviewed. No pertinent surgical history.  There were no vitals filed for this visit.      Subjective Assessment - 05/11/16 1922    Subjective  I brought my homework for [employer]- yesterday I came without my notebook   Pertinent History see epic snapshot;  R frontal ICH possibly due to fall   Patient Stated Goals I am hoping to be able to get back to work in the fall   Currently in Pain? 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  She indicated that she could use the time to heal, and may be able to help later in the school year.  Today's session focused on alternating and divided attention.  Patient completed two novel but simple visual scanning tasks, and at given intervals had to switch set to perform an unrelated but simple sorting task.  Patient  needed cueing and increased time to locate errors, and as task continued able to occasionally self correct.  Completed this session in busy, distracting gym.  Patient did not seem to lose focus by various unfamiliar auditory stimuli, however, was frequently derailed from task by conversation.  Patient very verbal this session, and needed cueing to return to original topic when asked a farly direct question.  Patient with limited ability to self regulate her flow of conversation, and had difficulty with topic maintenance.   Overtly reviewed goals of this session - to challenge her ability to alternate her attention, and at the end of the session, patient needed cueing as to why this was our chosen strategy.  At the end of the session, patient left without her notebook, and was unaware.  When notebook brought to her, she laughed stating she is always forgetting something.  Will continue to work to improve self regulation, and self organizing skills.                  OT Education - 05/11/16 1932    Education provided Yes   Education Details Discussed the value of working to improve alternating attention as related to work situation   [employer]) Educated Patient  Methods Explanation;Demonstration   Comprehension Verbal cues required;Need further instruction          OT Short Term Goals - 05/10/16 1617      OT SHORT TERM GOAL #1   Title Pt and husband will be mod I with home activities program- 05/10/2016   Status Achieved     OT SHORT TERM GOAL #2   Title Pt will demonstrate improved grip strength by at least 5 pounds to assist with functional tasks (baseline=45)   Status Achieved  achieved 60 pounds     OT SHORT TERM GOAL #3   Title Patient will reduce time with 9 hole peg test in left hand by 3 seconds to improve functional use of non dominant hand with ADL/IADL   Baseline Left: 32.71 04/22/16   Status Achieved  28.59     OT SHORT TERM GOAL #4   Title Pt will require no more  than min questioning cues for moderately complex problem solving functional tasks   Status Achieved     OT SHORT TERM GOAL #5   Title Pt will require no more than min a for alternating attention with familiar, moderately complex functional tasks   Status Achieved     OT SHORT TERM GOAL #6   Title Pt will be mod I with showering   Status Achieved     OT SHORT TERM GOAL #7   Title Pt will be mod I with dressing   Status Achieved     OT SHORT TERM GOAL #8   Title Pt will be mod I with toilet and shower transfers   Status Achieved           OT Long Term Goals - 05/10/16 1618      OT LONG TERM GOAL #1   Title Pt and husband will be mod I with home activities program - 06/07/2016   Status On-going     OT LONG TERM GOAL #2   Title Pt wil demonstrate at least 7 pound increase in grip strength to assist with functional tasks (baseline=45)   Status Achieved  60 pounds     OT LONG TERM GOAL #3   Title Pt will be mod I with simple familar hot meal prep   Status On-going     OT LONG TERM GOAL #5   Title Pt will require no more than 2 cues for moderately complext problem solving functional tasks   Status On-going     OT LONG TERM GOAL #6   Title Pt will be mod I with alternating attention for moderately complext task requiring alternating attention   Status On-going               Plan - 05/11/16 1933    Clinical Impression Statement Patient is showing steady progress toward her long term goals, and is hopeful to return to work in some capacity.   Rehab Potential Good   OT Frequency 2x / week   OT Duration 8 weeks   OT Treatment/Interventions Self-care/ADL training;DME and/or AE instruction;Neuromuscular education;Therapeutic exercise;Therapist, nutritional;Therapeutic activities;Balance training;Patient/family education;Visual/perceptual remediation/compensation;Cognitive remediation/compensation   Plan cognitive skills as related to work, attention to detail ,  alternating / divided attention, self organization, and problem solving   OT Home Exercise Plan Initiated HEP for fine motor coordination, green putty HEP   Consulted and Agree with Plan of Care Patient      Patient will benefit from skilled therapeutic intervention in order to improve the following deficits and impairments:  Decreased activity tolerance, Decreased balance, Decreased coordination, Decreased cognition, Decreased strength, Impaired UE functional use, Impaired tone, Impaired vision/preception, Pain  Visit Diagnosis: Frontal lobe and executive function deficit  Other lack of coordination  Visuospatial deficit  Hemiplegia, unspecified affecting left nondominant side (HCC)  Muscle weakness (generalized)    Problem List Patient Active Problem List   Diagnosis Date Noted  . Cognitive deficit due to recent stroke   . Left hemiparesis ([place] [facility]) 03/29/2016  . 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 Vascular headache   . Benign essential HTN   . Hyponatremia   . Acute blood loss anemia   . TBI (traumatic brain injury) ([place])   . Intracerebral hemorrhage 03/20/2016  . ICH (intracerebral hemorrhage) ([facility]) 03/20/2016  . Colon polyps   . Osteoporosis   . HTN (hypertension) 01/08/2013    Mariah Milling, OTR/L 05/11/2016, 7:36 PM  [facility] [address] [place], [place], [address] Phone: [phone]   Fax:  [phone]  Name: De Mink MRN: [id number] Date of Birth: 06/27/1946

## 2016-05-17 ENCOUNTER — Encounter: Payer: Medicare Other | Attending: Physical Medicine & Rehabilitation

## 2016-05-17 ENCOUNTER — Ambulatory Visit (HOSPITAL_BASED_OUTPATIENT_CLINIC_OR_DEPARTMENT_OTHER): Payer: Medicare Other | Admitting: Physical Medicine & Rehabilitation

## 2016-05-17 ENCOUNTER — Encounter: Payer: Self-pay | Admitting: Physical Medicine & Rehabilitation

## 2016-05-17 VITALS — BP 158/76 | HR 78 | Resp 14

## 2016-05-17 DIAGNOSIS — Z5189 Encounter for other specified aftercare: Secondary | ICD-10-CM | POA: Diagnosis present

## 2016-05-17 DIAGNOSIS — I69254 Hemiplegia and hemiparesis following other nontraumatic intracranial hemorrhage affecting left non-dominant side: Secondary | ICD-10-CM | POA: Diagnosis not present

## 2016-05-17 DIAGNOSIS — I611 Nontraumatic intracerebral hemorrhage in hemisphere, cortical: Secondary | ICD-10-CM

## 2016-05-17 DIAGNOSIS — I10 Essential (primary) hypertension: Secondary | ICD-10-CM | POA: Insufficient documentation

## 2016-05-17 NOTE — Patient Instructions (Signed)
Please call Dr. Idolina Primer to get some visual field testing done. Please fax results to my office.

## 2016-05-17 NOTE — Progress Notes (Signed)
Patient ID: Cynthia [name], female    DOB: [birth year], 70 y.o.   MRN: [id number] 70 year old right-handed female, admitted on March 20, 2016, with headache, left-sided weakness, altered mental status, and aphasia.  There was report of a recent fall from first step of a ladder several days prior.  She lives with her husband in [place], independent prior to admission.  CT of the head showed acute anterior right frontal lobe intra-axial hemorrhage, estimated hemorrhage volume 20 mL with surrounding edema, regional mass effect. MRI of the brain showed late acute-early subacute intraparenchymal hemorrhage in the right posterior frontal lobe with layering and surrounding edema   HPI Patient no longer feels like she has any problem with left neglect, or left field cut. However, she does have history of elevated intraocular pressures and has follow-up with ophthalmologist.  Has started OP SLP, PT, OT PT has been d/ced balance and walking back to normal Left hand strength improved from 45lb-60lb Left hand coordination improved Dressing herself, bathes herself  Review of therapy notes, working on divided concentration tasks, 85% accurate with the goal of 95%, therapy has been scheduled for at least another 4-6 weeks with OT and speech.  Pain Inventory Average Pain 9 Pain Right Now 1 My pain is constant and aching  In the last 24 hours, has pain interfered with the following? General activity 0 Relation with others 0 Enjoyment of life 2 What TIME of day is your pain at its worst? morning Sleep (in general) Good  Pain is worse with: bending and sitting Pain improves with: rest, heat/ice and therapy/exercise Relief from Meds: 5  Mobility walk without assistance how many minutes can you walk? 45 ability to climb steps?  yes do you drive?  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  yes  Neuro/Psych tremor tingling anxiety  Prior Studies Any changes since last visit?  no  Physicians involved in your care Any changes since last visit?  no   Family History  Problem Relation Age of Onset  . Heart disease Mother   . Cancer Mother     lung   Social History   Social History  . Marital status: Married    Spouse name: N/A  . Number of children: N/A  . Years of education: N/A   Social History Main Topics  . Smoking status: Never Smoker  . Smokeless tobacco: Never Used  . Alcohol use No     Comment: 1-2 beers/wine in evening  . Drug use: No  . Sexual activity: Not Asked     Comment: Married since 2005   Other Topics Concern  . None   Social History Narrative  . None   No past surgical history on file. Past Medical History:  Diagnosis Date  . Colon polyps   . Depression    history of  . Osteoporosis    BP (!) 158/76 (BP Location: Left Arm, Patient Position: Sitting, Cuff Size: Normal)   Pulse 78   Resp 14   SpO2 94%   Opioid Risk Score:   Fall Risk Score:  `1  Depression screen PHQ 2/9  Depression screen PHQ 2/9 05/17/2016  Decreased Interest 0  Down, Depressed, Hopeless 0  PHQ - 2 Score 0    Review of Systems  Constitutional: Negative.   HENT: Negative.   Eyes: Negative.   Respiratory: Negative.   Cardiovascular:  Negative.   Gastrointestinal: Negative.   Endocrine: Negative.   Genitourinary: Negative.   Musculoskeletal: Negative.   Skin: Negative.   Allergic/Immunologic: Negative.   Neurological: Negative.   Hematological: Negative.   Psychiatric/Behavioral: The patient is nervous/anxious.   All other systems reviewed and are negative.      Objective:   Physical Exam  Nursing note and vitals reviewed.   General: No acute distress Mood and affect are appropriate  Extremities: No clubbing, cyanosis, or edema Skin: No evidence of breakdown, no evidence of rash Neurologic: Cranial nerves II through XII  intact, motor strength is 5/5 in Right deltoid, bicep, tricep, grip, hip flexor, knee extensors, ankle dorsiflexor and plantar flexor, 4/5 on the left side in the same muscle groups Sensory exam normal sensation to light touch and pinprick in bilateral upper and lower extremities Cerebellar exam normal finger to nose to finger as well as heel to shin in bilateral upper and lower extremities Musculoskeletal: Full range of motion in all 4 extremities. No joint swelling Visual fields are intact confrontation testing      Assessment & Plan:  1Left hemiparesis, cognitive deficits related to CVA as well as mild left neglect related to right frontal intraparenchymal hemorrhage   Continue outpatient  OT, speech Physical medicine and rehabilitation follow-up in one month  In regards to driving, would need to get visual field testing per ophthalmology. Patient will call her ophthalmologist to schedule. I asked for a faxed copy of this.  2. Hypertension. Follow-up with primary care physician

## 2016-05-18 ENCOUNTER — Ambulatory Visit: Payer: Medicare Other | Attending: Physical Medicine & Rehabilitation

## 2016-05-18 ENCOUNTER — Ambulatory Visit: Payer: Medicare Other | Admitting: Physical Therapy

## 2016-05-18 ENCOUNTER — Ambulatory Visit: Payer: Medicare Other | Admitting: Occupational Therapy

## 2016-05-18 DIAGNOSIS — R41842 Visuospatial deficit: Secondary | ICD-10-CM | POA: Diagnosis present

## 2016-05-18 DIAGNOSIS — R278 Other lack of coordination: Secondary | ICD-10-CM | POA: Insufficient documentation

## 2016-05-18 DIAGNOSIS — R41844 Frontal lobe and executive function deficit: Secondary | ICD-10-CM | POA: Diagnosis present

## 2016-05-18 DIAGNOSIS — G8194 Hemiplegia, unspecified affecting left nondominant side: Secondary | ICD-10-CM | POA: Diagnosis present

## 2016-05-18 DIAGNOSIS — R41841 Cognitive communication deficit: Secondary | ICD-10-CM | POA: Diagnosis present

## 2016-05-18 DIAGNOSIS — M6281 Muscle weakness (generalized): Secondary | ICD-10-CM | POA: Diagnosis present

## 2016-05-18 NOTE — Therapy (Signed)
[facility] [address] [place], [place], [address] Phone: [phone]   Fax:  [phone]  Occupational Therapy Treatment  Patient Details  Name: Cynthia [name] MRN: [id number] Date of Birth: March 30, 1946 Referring Provider: Dr. Alysia Penna  Encounter Date: 05/18/2016      OT End of Session - 05/18/16 1032    Visit Number 9   Number of Visits 16   Date for OT Re-Evaluation 06/07/16   Authorization Type [employer] medicare - will need g code and PN every 10 visits   Authorization Time Period 60 days   Authorization - Visit Number 9   Authorization - Number of Visits 10   OT Start Time 1019   OT Stop Time 1100   OT Time Calculation (min) 41 min   Activity Tolerance Patient tolerated treatment well   Behavior During Therapy [facility] for tasks assessed/performed      Past Medical History:  Diagnosis Date  . Colon polyps   . Depression    history of  . Osteoporosis     No past surgical history on file.  There were no vitals filed for this visit.      Subjective Assessment - 05/18/16 1028    Pertinent History see epic snapshot;  R frontal ICH possibly due to fall   Patient Stated Goals I am hoping to be able to get back to work in the fall   Currently in Pain? Yes   Pain Score 1    Pain Location Back   Pain Orientation Lower   Pain Descriptors / Indicators Aching   Pain Type Chronic pain   Pain Onset More than a month ago   Pain Frequency Intermittent   Aggravating Factors  mornings   Pain Relieving Factors rest stretching,           Organizing your day task in a mildly distracting  environment with pt keeping track of the time an alternating to perform a  functional problem solving task at a specific time. Pt was able to complete the functional problem solving task with 100% accuracy. She resumed organization task and made 1 error in sequence of events even with therapist providing questioning  cueing (She did not schedule grocery store last even though the information included 90 temps). Therapist discussed error/ rationale  with pt. Pt reports that she feels her attention is improving and she was not distracted by activities in the gym today.                       OT Short Term Goals - 05/10/16 1617      OT SHORT TERM GOAL #1   Title Pt and husband will be mod I with home activities program- 05/10/2016   Status Achieved     OT SHORT TERM GOAL #2   Title Pt will demonstrate improved grip strength by at least 5 pounds to assist with functional tasks (baseline=45)   Status Achieved  achieved 60 pounds     OT SHORT TERM GOAL #3   Title Patient will reduce time with 9 hole peg test in left hand by 3 seconds to improve functional use of non dominant hand with ADL/IADL   Baseline Left: 32.71 04/22/16   Status Achieved  28.59     OT SHORT TERM GOAL #4   Title Pt will require no more than min questioning cues for moderately complex problem solving functional tasks   Status Achieved  OT SHORT TERM GOAL #5   Title Pt will require no more than min a for alternating attention with familiar, moderately complex functional tasks   Status Achieved     OT SHORT TERM GOAL #6   Title Pt will be mod I with showering   Status Achieved     OT SHORT TERM GOAL #7   Title Pt will be mod I with dressing   Status Achieved     OT SHORT TERM GOAL #8   Title Pt will be mod I with toilet and shower transfers   Status Achieved           OT Long Term Goals - 05/10/16 1618      OT LONG TERM GOAL #1   Title Pt and husband will be mod I with home activities program - 06/07/2016   Status On-going     OT LONG TERM GOAL #2   Title Pt wil demonstrate at least 7 pound increase in grip strength to assist with functional tasks (baseline=45)   Status Achieved  60 pounds     OT LONG TERM GOAL #3   Title Pt will be mod I with simple familar hot meal prep   Status On-going      OT LONG TERM GOAL #5   Title Pt will require no more than 2 cues for moderately complext problem solving functional tasks   Status On-going     OT LONG TERM GOAL #6   Title Pt will be mod I with alternating attention for moderately complext task requiring alternating attention   Status On-going               Plan - 05/18/16 1031    Clinical Impression Statement Pt is progressing towards goals.    Rehab Potential Good   OT Frequency 2x / week   OT Duration 8 weeks   Plan continue to address work related skills and alternating/ divided attention   OT Home Exercise Plan Initiated HEP for fine motor coordination, green putty HEP   Consulted and Agree with Plan of Care Patient      Patient will benefit from skilled therapeutic intervention in order to improve the following deficits and impairments:  Decreased activity tolerance, Decreased balance, Decreased coordination, Decreased cognition, Decreased strength, Impaired UE functional use, Impaired tone, Impaired vision/preception, Pain  Visit Diagnosis: Frontal lobe and executive function deficit  Other lack of coordination  Visuospatial deficit    Problem List Patient Active Problem List   Diagnosis Date Noted  . Cognitive deficit due to recent stroke   . Left hemiparesis ([place]) 03/29/2016  . 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 Vascular headache   . Benign essential HTN   . Hyponatremia   . Acute blood loss anemia   . TBI (traumatic brain injury) ([place])   . Intracerebral hemorrhage 03/20/2016  . ICH (intracerebral hemorrhage) ([place]) 03/20/2016  . Colon polyps   . Osteoporosis   . HTN (hypertension) 01/08/2013    Anny Sayler 05/18/2016, 10:33 AM Theone Murdoch, OTR/L [phone] Phone: [phone] 10:33 AM 05/18/16 [facility] Cynthia [name] [address] [place], [place], [address] Phone: [phone]   Fax:  [phone]  Name: Cynthia [name] MRN: [id number] Date of Birth: 1946/05/02

## 2016-05-18 NOTE — Patient Instructions (Signed)
Continue to listen to TED talks of longer and longer times, taking notes and re-listening for accuracy. We will go down to once a week.

## 2016-05-18 NOTE — Therapy (Signed)
[facility] [address] [place], [place], [address] Phone: [phone]   Fax:  [phone]  Speech Language Pathology Treatment  Patient Details  Name: Tya Lisanti MRN: [id number] Date of Birth: 03-27-1946 Referring Provider: Alysia Penna  Encounter Date: 05/18/2016      End of Session - 05/18/16 1734    Visit Number 9   Number of Visits 24   Date for SLP Re-Evaluation 07/08/16   SLP Start Time 0933   SLP Stop Time  [id number]   SLP Time Calculation (min) 42 min   Activity Tolerance Patient tolerated treatment well      Past Medical History:  Diagnosis Date  . Colon polyps   . Depression    history of  . Osteoporosis     No past surgical history on file.  There were no vitals filed for this visit.      Subjective Assessment - 05/18/16 0945    Subjective Pt provided all homework to SLP.               ADULT SLP TREATMENT - 05/18/16 0946      General Information   Behavior/Cognition Alert;Cooperative;Pleasant mood     Treatment Provided   Treatment provided Cognitive-Linquistic     Pain Assessment   Pain Assessment 0-10   Pain Score 2    Pain Location lower back   Pain Descriptors / Indicators Aching   Pain Intervention(s) Monitored during session     Cognitive-Linquistic Treatment   Treatment focused on Cognition   Skilled Treatment Pt utilized divided attention today as factilitated by SLP, by taking notes on TED talks as well as figuring verbally presented mod complex math problems. Pt with 100% success with math problems and rare min A needed for notes on TED talks. (7 1/2 minutes). SLP discussed pt frequency at once a week and she agreed as she feels she is returning more to baseline.     Assessment / Recommendations / Plan   Plan Continue with current plan of care     Progression Toward Goals   Progression toward goals Progressing toward goals            SLP Short  Term Goals - 05/18/16 1735      SLP SHORT TERM GOAL #1   Title pt will alternate attention between or within two mod complex linguistic tasks with 85% success over three sessions   Status Achieved     SLP SHORT TERM GOAL #2   Title pt will follow verbally given multi-unit multistep commands with modified independence PRN (requesting repeats, etc.) over two sessions   Status Achieved          SLP Long Term Goals - 05/18/16 1734      SLP LONG TERM GOAL #1   Title pt will demo improved performance on the Attention Processing Test Exercise X, when given 1-2 second delays between stimuli   Time 5   Period Weeks   Status On-going     SLP LONG TERM GOAL #2   Title pt will demo divided attention between two mod complex linguistic tasks to produce 95% on both tasks over three sessions   Time 5   Period Weeks   Status On-going          Plan - 05/18/16 1015    Clinical Impression Statement Resolving high level attention and auditory comprehension impairments remain - continue skilled ST to maximize high level cognition 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    Speech Therapy Frequency 1x /week   Duration 4 weeks   Treatment/Interventions Language facilitation;Cognitive reorganization;Compensatory techniques;Internal/external aids;SLP instruction and feedback;Functional tasks;Patient/family education;Environmental controls   Potential to Achieve Goals Good      Patient will benefit from skilled therapeutic intervention in order to improve the following deficits and impairments:   Cognitive communication deficit    Problem List Patient Active Problem List   Diagnosis Date Noted  . Cognitive deficit due to recent stroke   . Left hemiparesis ([place]) 03/29/2016  . 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 Vascular headache   . Benign essential HTN   . Hyponatremia   . Acute blood loss anemia   . TBI (traumatic brain injury) ([place])   .  Intracerebral hemorrhage 03/20/2016  . ICH (intracerebral hemorrhage) ([place]) 03/20/2016  . Colon polyps   . Osteoporosis   . HTN (hypertension) 01/08/2013    [facility] ,[place], [place]  05/18/2016, 5:35 PM  [facility] [address] [place], [place], [address] Phone: [phone]   Fax:  [phone]   Name: Lelah Volkers MRN: [id number] Date of Birth: May 19, 1946

## 2016-05-19 ENCOUNTER — Ambulatory Visit: Payer: Medicare Other | Admitting: Physical Therapy

## 2016-05-19 ENCOUNTER — Ambulatory Visit: Payer: Medicare Other | Admitting: Speech Pathology

## 2016-05-23 ENCOUNTER — Telehealth: Payer: Self-pay | Admitting: Physical Medicine & Rehabilitation

## 2016-05-23 ENCOUNTER — Encounter: Payer: Self-pay | Admitting: Physical Medicine & Rehabilitation

## 2016-05-25 ENCOUNTER — Encounter: Payer: Self-pay | Admitting: Occupational Therapy

## 2016-05-25 ENCOUNTER — Ambulatory Visit: Payer: Medicare Other | Admitting: Occupational Therapy

## 2016-05-25 DIAGNOSIS — R41841 Cognitive communication deficit: Secondary | ICD-10-CM | POA: Diagnosis not present

## 2016-05-25 DIAGNOSIS — G8194 Hemiplegia, unspecified affecting left nondominant side: Secondary | ICD-10-CM

## 2016-05-25 DIAGNOSIS — R41844 Frontal lobe and executive function deficit: Secondary | ICD-10-CM

## 2016-05-25 DIAGNOSIS — R41842 Visuospatial deficit: Secondary | ICD-10-CM

## 2016-05-25 DIAGNOSIS — M6281 Muscle weakness (generalized): Secondary | ICD-10-CM

## 2016-05-25 DIAGNOSIS — R278 Other lack of coordination: Secondary | ICD-10-CM

## 2016-05-25 NOTE — Therapy (Signed)
[facility] [address] [place], [place], [address] Phone: [phone]   Fax:  [phone]  Occupational Therapy Treatment  Patient Details  Name: Cynthia [name] MRN: [id number] Date of Birth: May 17, 1946 Referring Provider: Dr. Alysia Penna  Encounter Date: 05/25/2016      OT End of Session - 05/25/16 1643    Visit Number 10   Number of Visits 16   Date for OT Re-Evaluation 06/07/16   Authorization Type [employer] medicare - will need g code and PN every 10 visits   Authorization Time Period 60 days   Authorization - Visit Number 10   Authorization - Number of Visits 20   OT Start Time 3557   OT Stop Time 1532   OT Time Calculation (min) 45 min   Activity Tolerance Patient tolerated treatment well   Behavior During Therapy [facility] for tasks assessed/performed      Past Medical History:  Diagnosis Date  . Colon polyps   . Depression    history of  . Osteoporosis     History reviewed. No pertinent surgical history.  There were no vitals filed for this visit.      Subjective Assessment - 05/25/16 1458    Subjective  I was at a luncheon with some colleagues.  I was a bit nervous initially.  It went well.     Patient Stated Goals I am hoping to be able to get back to work in the fall   Currently in Pain? No/denies   Pain Score 0-No pain                      OT Treatments/Exercises (OP) - 05/25/16 0001      ADLs   Driving Patient has received a letter recently from [place] allowing her to begin a graduated driving program.  Reviewed this structure with patient, reinforcing no night or interstate driving initially.      Work Simulated work activity to assess auditory attention, as well as Charity fundraiser as related to job performance.  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  Worked on reading page level  content with planned errors - substitutions and omissions.  Patient initially struggled to organize herself as to scoring key, and once this strategy determined, able to score with 75% accuracy initially.  As the treatment area became busier her accuracy rate declined significantly.  Patient aware, and did better with very pointed instructions at 1-2 paragraph level.                  OT Education - 05/25/16 1643    Education provided Yes   Education Details Discussed graduated driving program, reviewed resources given for driving instruction if warranted.   Person(s) Educated Patient   Methods Explanation;Demonstration   Comprehension Verbalized understanding          OT Short Term Goals - 05/25/16 1645      OT SHORT TERM GOAL #1   Title Pt and husband will be mod I with home activities program- 05/10/2016   Status Achieved     OT SHORT TERM GOAL #2   Title Pt will demonstrate improved grip strength by at least 5 pounds to assist with functional tasks (baseline=45)   Status Achieved     OT SHORT TERM GOAL #3   Title Patient will reduce time with 9 hole peg test in left hand by  3 seconds to improve functional use of non dominant hand with ADL/IADL   Status Achieved     OT SHORT TERM GOAL #4   Title Pt will require no more than min questioning cues for moderately complex problem solving functional tasks   Status Achieved     OT SHORT TERM GOAL #5   Title Pt will require no more than min a for alternating attention with familiar, moderately complex functional tasks   Status Achieved     OT SHORT TERM GOAL #6   Title Pt will be mod I with showering   Status Achieved     OT SHORT TERM GOAL #7   Title Pt will be mod I with dressing   Status Achieved     OT SHORT TERM GOAL #8   Title Pt will be mod I with toilet and shower transfers   Status Achieved           OT Long Term Goals - 06-02-2016 1645      OT LONG TERM GOAL #1   Title Pt and husband will be mod I  with home activities program - 06/07/2016   Status On-going     OT LONG TERM GOAL #2   Title Pt wil demonstrate at least 7 pound increase in grip strength to assist with functional tasks (baseline=45)   Status Achieved     OT LONG TERM GOAL #3   Title Pt will be mod I with simple familar hot meal prep   Status On-going     OT LONG TERM GOAL #5   Title Pt will require no more than 2 cues for moderately complext problem solving functional tasks   Status On-going     OT LONG TERM GOAL #6   Title Pt will be mod I with alternating attention for moderately complext task requiring alternating attention   Status On-going               Plan - 06-02-16 1644    Clinical Impression Statement Patient continues to improve independence with IADL, and return to work related skills   Rehab Potential Good   OT Frequency 2x / week   OT Duration 8 weeks   OT Treatment/Interventions Self-care/ADL training;DME and/or AE instruction;Neuromuscular education;Therapeutic exercise;Therapist, nutritional;Therapeutic activities;Balance training;Patient/family education;Visual/perceptual remediation/compensation;Cognitive remediation/compensation   Plan work related skills, divided attention, self organizing skills, consider 1x/week   OT Home Exercise Plan Initiated HEP for fine motor coordination, green putty HEP   Consulted and Agree with Plan of Care Patient      Patient will benefit from skilled therapeutic intervention in order to improve the following deficits and impairments:  Decreased activity tolerance, Decreased balance, Decreased coordination, Decreased cognition, Decreased strength, Impaired UE functional use, Impaired tone, Impaired vision/preception, Pain  Visit Diagnosis: Frontal lobe and executive function deficit  Other lack of coordination  Visuospatial deficit  Cognitive communication deficit  Hemiplegia, unspecified affecting left nondominant side (HCC)  Muscle  weakness (generalized)      G-Codes - 2016-06-02 1647    Functional Assessment Tool Used skilled clinical observation (have used 9 hole peg, and dynamometer recently)   Functional Limitation Self care   Self Care Current Status (X5056) At least 40 percent but less than 60 percent impaired, limited or restricted   Self Care Goal Status (P7948) At least 20 percent but less than 40 percent impaired, limited or restricted      Problem List Patient Active Problem List   Diagnosis Date Noted  .  Cognitive deficit due to recent stroke   . Left hemiparesis ([place]) 03/29/2016  . 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 Vascular headache   . Benign essential HTN   . Hyponatremia   . Acute blood loss anemia   . TBI (traumatic brain injury) ([place])   . Intracerebral hemorrhage 03/20/2016  . ICH (intracerebral hemorrhage) ([place]) 03/20/2016  . Colon polyps   . Osteoporosis   . HTN (hypertension) 01/08/2013   Occupational Therapy Progress Note  Dates of Reporting Period: 04/12/16  to 05/25/16  Objective Reports of Subjective Statement: Patient is showing improved self regulation, and attention, and is considering returning to driving  Objective Measurements: Patient has had improved hand strength and left hand coordination see 9 hole peg, and gross grasp strength  Goal Update: see above  Plan: Continue with OT plan of care.  .    Reason Skilled Services are Required: Patient has met all short term goals, and is working toward achievement of long term goals   Mariah Milling, OTR/L 05/25/2016, 4:48 PM  [facility] [address] [place], [place], [address] Phone: [phone]   Fax:  [phone]  Name: Saleha Kalp MRN: [id number] Date of Birth: 1946/05/06

## 2016-05-26 NOTE — Telephone Encounter (Signed)
Letter written regarding driving, limited driving

## 2016-05-27 ENCOUNTER — Ambulatory Visit: Payer: Medicare Other

## 2016-05-27 ENCOUNTER — Ambulatory Visit: Payer: Medicare Other | Admitting: Occupational Therapy

## 2016-05-27 ENCOUNTER — Encounter: Payer: Self-pay | Admitting: Occupational Therapy

## 2016-05-27 DIAGNOSIS — R41844 Frontal lobe and executive function deficit: Secondary | ICD-10-CM

## 2016-05-27 DIAGNOSIS — R41842 Visuospatial deficit: Secondary | ICD-10-CM

## 2016-05-27 DIAGNOSIS — G8194 Hemiplegia, unspecified affecting left nondominant side: Secondary | ICD-10-CM

## 2016-05-27 DIAGNOSIS — R41841 Cognitive communication deficit: Secondary | ICD-10-CM | POA: Diagnosis not present

## 2016-05-27 DIAGNOSIS — M6281 Muscle weakness (generalized): Secondary | ICD-10-CM

## 2016-05-27 DIAGNOSIS — R278 Other lack of coordination: Secondary | ICD-10-CM

## 2016-05-27 NOTE — Therapy (Signed)
[facility] [address] [place], [place], [address] Phone: [phone]   Fax:  [phone]  Occupational Therapy Treatment  Patient Details  Name: Cynthia [name] MRN: [id number] Date of Birth: April 10, 1946 Referring Provider: Dr. Alysia Penna  Encounter Date: 05/27/2016      OT End of Session - 05/27/16 1656    Visit Number 11   Number of Visits 16   Date for OT Re-Evaluation 06/07/16   Authorization Type [employer] medicare - will need g code and PN every 10 visits   Authorization Time Period 78   Authorization - Visit Number 11   Authorization - Number of Visits 20   OT Start Time 1450   OT Stop Time 1530   OT Time Calculation (min) 40 min   Activity Tolerance Patient tolerated treatment well   Behavior During Therapy [facility] for tasks assessed/performed      Past Medical History:  Diagnosis Date  . Colon polyps   . Depression    history of  . Osteoporosis     History reviewed. No pertinent surgical history.  There were no vitals filed for this visit.      Subjective Assessment - 05/27/16 1650    Subjective  I did start driving with my husband   Pertinent History see epic snapshot;  R frontal ICH possibly due to fall   Patient Stated Goals I am hoping to be able to get back to work in the fall   Currently in Pain? No/denies   Pain Score 0-No pain                      OT Treatments/Exercises (OP) - 05/27/16 0001      ADLs   Driving Patient has started driving with her husband on back roads.  Patient drove safely to therapy today with husband supervising.    Work [employer] work activity.  Patient better able to organize herself for this task today (same as two days ago) and better able to follow along, and score accurately.  Patient able to locate all errors with two trials of reading passage.  Patient also able to plan errors and read for therapsist, maintaining planned errors.                   OT Education - 05/27/16 1656    Education provided Yes   Education Details Reviewed driving program   Person(s) Educated Patient   Methods Explanation   Comprehension Verbalized understanding          OT Short Term Goals - 05/25/16 1645      OT SHORT TERM GOAL #1   Title Pt and husband will be mod I with home activities program- 05/10/2016   Status Achieved     OT SHORT TERM GOAL #2   Title Pt will demonstrate improved grip strength by at least 5 pounds to assist with functional tasks (baseline=45)   Status Achieved     OT SHORT TERM GOAL #3   Title Patient will reduce time with 9 hole peg test in left hand by 3 seconds to improve functional use of non dominant hand with ADL/IADL   Status Achieved     OT SHORT TERM GOAL #4   Title Pt will require no more than min questioning cues for moderately complex problem solving functional tasks   Status Achieved     OT SHORT TERM GOAL #5   Title Pt will require no more than  min a for alternating attention with familiar, moderately complex functional tasks   Status Achieved     OT SHORT TERM GOAL #6   Title Pt will be mod I with showering   Status Achieved     OT SHORT TERM GOAL #7   Title Pt will be mod I with dressing   Status Achieved     OT SHORT TERM GOAL #8   Title Pt will be mod I with toilet and shower transfers   Status Achieved           OT Long Term Goals - 05/25/16 1645      OT LONG TERM GOAL #1   Title Pt and husband will be mod I with home activities program - 06/07/2016   Status On-going     OT LONG TERM GOAL #2   Title Pt wil demonstrate at least 7 pound increase in grip strength to assist with functional tasks (baseline=45)   Status Achieved     OT LONG TERM GOAL #3   Title Pt will be mod I with simple familar hot meal prep   Status On-going     OT LONG TERM GOAL #5   Title Pt will require no more than 2 cues for moderately complext problem solving functional tasks   Status  On-going     OT LONG TERM GOAL #6   Title Pt will be mod I with alternating attention for moderately complext task requiring alternating attention   Status On-going               Plan - 05/27/16 1657    Clinical Impression Statement Patient continues to improvre with IADL, and return to work related skills   Rehab Potential Good   OT Frequency 2x / week   OT Duration 8 weeks   OT Treatment/Interventions Self-care/ADL training;DME and/or AE instruction;Neuromuscular education;Therapeutic exercise;Therapist, nutritional;Therapeutic activities;Balance training;Patient/family education;Visual/perceptual remediation/compensation;Cognitive remediation/compensation   Plan work related skills, divided attention, self organizing skills, consider 1x week   OT Home Exercise Plan Initiated HEP for fine motor coordination, green putty HEP   Consulted and Agree with Plan of Care Patient      Patient will benefit from skilled therapeutic intervention in order to improve the following deficits and impairments:  Decreased activity tolerance, Decreased balance, Decreased coordination, Decreased cognition, Decreased strength, Impaired UE functional use, Impaired tone, Impaired vision/preception, Pain  Visit Diagnosis: Frontal lobe and executive function deficit  Other lack of coordination  Visuospatial deficit  Muscle weakness (generalized)  Hemiplegia, unspecified affecting left nondominant side ([place])    Problem List Patient Active Problem List   Diagnosis Date Noted  . Cognitive deficit due to recent stroke   . Left hemiparesis ([place]) 03/29/2016  . 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 Vascular headache   . Benign essential HTN   . Hyponatremia   . Acute blood loss anemia   . TBI (traumatic brain injury) ([place])   . Intracerebral hemorrhage 03/20/2016  . ICH (intracerebral hemorrhage) ([place]) 03/20/2016  . Colon polyps   . Osteoporosis   . HTN (hypertension) 01/08/2013     Cynthia [name] 05/27/2016, 4:59 PM  [facility] [address] [place], [place], [address] Phone: [phone]   Fax:  [phone]  Name: Cynthia [name] MRN: [id number] Date of Birth: [birth year]

## 2016-05-27 NOTE — Therapy (Addendum)
[facility] [address] [place], [place], [address] Phone: [phone]   Fax:  [phone]  Speech Language Pathology Treatment  Patient Details  Name: Sajdah Tune MRN: [id number] Date of Birth: 10/13/1945 Referring Provider: Alysia Penna  Encounter Date: 05/27/2016      End of Session - 05/27/16 1501    Visit Number 10   Number of Visits 24   Date for SLP Re-Evaluation 07/08/16   SLP Start Time [id number]   SLP Stop Time  1450   SLP Time Calculation (min) 45 min   Activity Tolerance Patient tolerated treatment well      Past Medical History:  Diagnosis Date  . Colon polyps   . Depression    history of  . Osteoporosis     No past surgical history on file.  There were no vitals filed for this visit.      Subjective Assessment - 05/27/16 1417    Subjective Pt reported that crossword puzzle and TV synopsis went well for homework.   Currently in Pain? No/denies               ADULT SLP TREATMENT - 05/27/16 1419      General Information   Behavior/Cognition Alert;Cooperative;Pleasant mood     Treatment Provided   Treatment provided Cognitive-Linquistic     Cognitive-Linquistic Treatment   Treatment focused on Cognition   Skilled Treatment Pt with explanation of last OT session and possibly explaining away errors (re: ambulance at luncheon, and not used to scoring reading assessments on paper). In divided attention tasks today with SLP, mod complex auditory and written tasks were completed with 62% alternating attention and 38% divided attention. SLP told pt he thought pt could have provided more responses using divided attention, prior to CVA. Pt agreed, and agreed to cont to work on divided attention at home with auditory and written materials.     Assessment / Recommendations / Plan   Plan Continue with current plan of care     Progression Toward Goals   Progression toward goals  Progressing toward goals            SLP Short Term Goals - 05/18/16 1735      SLP SHORT TERM GOAL #1   Title pt will alternate attention between or within two mod complex linguistic tasks with 85% success over three sessions   Status Achieved     SLP SHORT TERM GOAL #2   Title pt will follow verbally given multi-unit multistep commands with modified independence PRN (requesting repeats, etc.) over two sessions   Status Achieved          SLP Long Term Goals - 05/27/16 1421      SLP LONG TERM GOAL #1   Title pt will demo improved performance on the Attention Processing Test Exercise X, when given 1-2 second delays between stimuli   Time 3   Period Weeks   Status On-going     SLP LONG TERM GOAL #2   Title pt will demo divided attention between two mod complex linguistic tasks to produce 95% on both tasks over three sessions   Time 3   Period Weeks   Status On-going          Plan - 05/27/16 1501    Clinical Impression Statement Mild high level attention and auditory comprehension impairments remain - continue skilled ST to 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    Speech Therapy Frequency 1x /week   Duration 4 weeks   Treatment/Interventions Language facilitation;Cognitive reorganization;Compensatory techniques;Internal/external aids;SLP instruction and feedback;Functional tasks;Patient/family education;Environmental controls   Potential to Achieve Goals Good      SLP g-codes: Memory current [id number])  1-20% impaired Memory goal [id number])  1-20% impaired   Patient will benefit from skilled therapeutic intervention in order to improve the following deficits and impairments:   Cognitive communication deficit    Problem List Patient Active Problem List   Diagnosis Date Noted  . Cognitive deficit due to recent stroke   . Left hemiparesis ([place]) 03/29/2016  . Left-sided visual neglect  03/29/2016  . Vascular headache   . Benign essential HTN   . Hyponatremia   . Acute blood loss anemia   . TBI (traumatic brain injury) ([place])   . Intracerebral hemorrhage 03/20/2016  . ICH (intracerebral hemorrhage) ([place]) 03/20/2016  . Colon polyps   . Osteoporosis   . HTN (hypertension) 01/08/2013    [facility] ,[place], [place]  05/27/2016, 3:02 PM  [facility] [address] [place], [place], [address] Phone: [phone]   Fax:  [phone]   Name: Azeneth Kost MRN: [id number] Date of Birth: 1946-08-14

## 2016-05-27 NOTE — Patient Instructions (Signed)
  Please complete the assigned speech therapy homework and return it to your next session.  

## 2016-05-31 ENCOUNTER — Ambulatory Visit: Payer: Medicare Other | Admitting: Occupational Therapy

## 2016-06-02 ENCOUNTER — Ambulatory Visit: Payer: Medicare Other | Admitting: *Deleted

## 2016-06-02 ENCOUNTER — Ambulatory Visit: Payer: Medicare Other | Admitting: Occupational Therapy

## 2016-06-02 DIAGNOSIS — R278 Other lack of coordination: Secondary | ICD-10-CM

## 2016-06-02 DIAGNOSIS — M6281 Muscle weakness (generalized): Secondary | ICD-10-CM

## 2016-06-02 DIAGNOSIS — R41844 Frontal lobe and executive function deficit: Secondary | ICD-10-CM

## 2016-06-02 DIAGNOSIS — R41841 Cognitive communication deficit: Secondary | ICD-10-CM | POA: Diagnosis not present

## 2016-06-02 DIAGNOSIS — G8194 Hemiplegia, unspecified affecting left nondominant side: Secondary | ICD-10-CM

## 2016-06-02 DIAGNOSIS — R41842 Visuospatial deficit: Secondary | ICD-10-CM

## 2016-06-02 NOTE — Therapy (Signed)
[facility] [address] [place], [place], [address] Phone: [phone]   Fax:  [phone]  Speech Language Pathology Treatment  Patient Details  Name: Cynthia [name] MRN: [id number] Date of Birth: Nov 17, 1945 Referring Provider: Alysia Penna  Encounter Date: 06/02/2016      End of Session - 06/02/16 1642    Visit Number 11   Number of Visits 24   Date for SLP Re-Evaluation 07/08/16   Activity Tolerance Patient tolerated treatment well      Past Medical History:  Diagnosis Date  . Colon polyps   . Depression    history of  . Osteoporosis     No past surgical history on file.  There were no vitals filed for this visit.      Subjective Assessment - 06/02/16 1450    Subjective "I have a better understanding of strokes now"   Pain Score 0-No pain               ADULT SLP TREATMENT - 06/02/16 0001      General Information   Behavior/Cognition Alert;Cooperative;Pleasant mood     Treatment Provided   Treatment provided Cognitive-Linquistic     Pain Assessment   Pain Assessment 0-10   Pain Score 1    Pain Location lower back   Pain Descriptors / Indicators Dull   Pain Intervention(s) Monitored during session     Cognitive-Linquistic Treatment   Treatment focused on Cognition   Skilled Treatment Pt discussed with SLP homework and difficulty with multi-tasking and fatigue factor paired with literal interpretation of directives . In divided attention tasks today with SLP, mod complex auditory and written tasks were completed with 65% alternating attention and 40% divided attention. SLP told pt she most likely could have provided more detailed information re: answers pre-CVA. Pt agreed, and agreed to cont to work on divided attention at home, specifically summarizing a chapter in a book regarding a stroke survivor and regaining their life post-CVA.     Assessment / Recommendations / Plan   Plan Continue with current plan of care     Progression Toward Goals   Progression toward goals Progressing toward goals            SLP Short Term Goals - 06/02/16 1643      SLP SHORT TERM GOAL #1   Title pt will alternate attention between or within two mod complex linguistic tasks with 85% success over three sessions   Status Achieved     SLP SHORT TERM GOAL #2   Title pt will follow verbally given multi-unit multistep commands with modified independence PRN (requesting repeats, etc.) over two sessions   Status Achieved          SLP Long Term Goals - 06/02/16 1644      SLP LONG TERM GOAL #1   Title pt will demo improved performance on the Attention Processing Test Exercise X, when given 1-2 second delays between stimuli   Time 2   Period Weeks   Status On-going     SLP LONG TERM GOAL #2   Title pt will demo divided attention between two mod complex linguistic tasks to produce 95% on both tasks over three sessions   Time 2   Period Weeks   Status On-going          Plan - 06/02/16 1643    Clinical Impression Statement Mild high level attention and auditory comprehension impairments remain - continue skilled ST to 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    Speech Therapy Frequency 1x /week   Duration 4 weeks   Treatment/Interventions Language facilitation;Cognitive reorganization;Compensatory techniques;Internal/external aids;SLP instruction and feedback;Functional tasks;Patient/family education;Environmental controls   Potential to Achieve Goals Good      Patient will benefit from skilled therapeutic intervention in order to improve the following deficits and impairments:   Cognitive communication deficit    Problem List Patient Active Problem List   Diagnosis Date Noted  . Cognitive deficit due to recent stroke   . Left hemiparesis ([place]) 03/29/2016  . Left-sided visual neglect  03/29/2016  . Vascular headache   . Benign essential HTN   . Hyponatremia   . Acute blood loss anemia   . TBI (traumatic brain injury) ([place])   . Intracerebral hemorrhage 03/20/2016  . ICH (intracerebral hemorrhage) ([place]) 03/20/2016  . Colon polyps   . Osteoporosis   . HTN (hypertension) 01/08/2013    Domonique Cothran,PAT, M.S., CCC-SLP 06/02/2016, 4:45 PM  [facility] [address] [place], [place], [address] Phone: [phone]   Fax:  [phone]   Name: Cynthia [name] MRN: [id number] Date of Birth: [birth year]

## 2016-06-03 ENCOUNTER — Encounter: Payer: Self-pay | Admitting: Occupational Therapy

## 2016-06-03 DIAGNOSIS — R41841 Cognitive communication deficit: Secondary | ICD-10-CM | POA: Diagnosis not present

## 2016-06-03 NOTE — Therapy (Signed)
[facility] [address] [place], [place], [address] Phone: [phone]   Fax:  [phone]  Occupational Therapy Treatment  Patient Details  Name: Cynthia [name] MRN: [id number] Date of Birth: [birth year] Referring Provider: Dr. Alysia Penna  Encounter Date: 06/02/2016      OT End of Session - 06/03/16 1130    Visit Number 12   Number of Visits 16   Date for OT Re-Evaluation 06/07/16   Authorization Type [employer] medicare - will need g code and PN every 10 visits   Authorization Time Period 23   Authorization - Visit Number 12   Authorization - Number of Visits 20   OT Start Time 0867   OT Stop Time 1608   OT Time Calculation (min) 38 min   Activity Tolerance Patient tolerated treatment well   Behavior During Therapy [facility] for tasks assessed/performed      Past Medical History:  Diagnosis Date  . Colon polyps   . Depression    history of  . Osteoporosis     History reviewed. No pertinent surgical history.  There were no vitals filed for this visit.      Subjective Assessment - 06/03/16 1118    Subjective  I drove here myself!  I am back in yoga - feel like I am more myself again!   Pertinent History see epic snapshot;  R frontal ICH possibly due to fall   Patient Stated Goals I am hoping to be able to get back to work in the fall   Currently in Pain? No/denies   Pain Score 0-No pain                      OT Treatments/Exercises (OP) - 06/03/16 0001      ADLs   Cooking Reviewed patient's long term goals, and patient has met all remaining goals.  Patient is not only cooking fairly complex meals at home, but is also grocery shopping for needed supplies.     Driving Patient has returned to driving, following carefully the graduated steps outlined by therapist, and then physiatrist.  Patient drove to therapy last week, with husband supervisiong, and this week drove herself to appointment.      ADL Comments Long discussion with patient regarding her progress:  cognitively - topic maintenance in conversation, selective to alternating attention, attention to detail, and improved memory  and improved use of compensatory strategies for memory.  Patient with much improved self regulation, adn self organization.  Patient feels prepared for discharge from OT services.                  OT Education - 06/03/16 1130    Education provided Yes   Education Details Reviewed progress as realted to IADL, and volunteer/ work related skills   Person(s) Educated Patient   Methods Explanation   Comprehension Verbalized understanding          OT Short Term Goals - 06/03/16 1132      OT SHORT TERM GOAL #1   Title Pt and husband will be mod I with home activities program- 05/10/2016   Status Achieved     OT SHORT TERM GOAL #2   Title Pt will demonstrate improved grip strength by at least 5 pounds to assist with functional tasks (baseline=45)   Status Achieved     OT SHORT TERM GOAL #3   Title Patient will reduce time with 9 hole peg test in left hand  by 3 seconds to improve functional use of non dominant hand with ADL/IADL   Status Achieved     OT SHORT TERM GOAL #4   Title Pt will require no more than min questioning cues for moderately complex problem solving functional tasks   Status Achieved     OT SHORT TERM GOAL #5   Title Pt will require no more than min a for alternating attention with familiar, moderately complex functional tasks   Status Achieved     OT SHORT TERM GOAL #6   Title Pt will be mod I with showering   Status Achieved     OT SHORT TERM GOAL #7   Title Pt will be mod I with dressing   Status Achieved     OT SHORT TERM GOAL #8   Title Pt will be mod I with toilet and shower transfers   Status Achieved           OT Long Term Goals - June 22, 2016 1132      OT LONG TERM GOAL #1   Title Pt and husband will be mod I with home activities program -  06/07/2016   Status Achieved     OT LONG TERM GOAL #2   Title Pt wil demonstrate at least 7 pound increase in grip strength to assist with functional tasks (baseline=45)   Status Achieved     OT LONG TERM GOAL #3   Title Pt will be mod I with simple familar hot meal prep   Status Achieved     OT LONG TERM GOAL #5   Title Pt will require no more than 2 cues for moderately complex problem solving functional tasks   Status Achieved     OT LONG TERM GOAL #6   Title Pt will be mod I with alternating attention for moderately complext task requiring alternating attention   Status Achieved               Plan - 06-22-16 1131    Clinical Impression Statement Discontinue further OT services - goals met.  Patient very pleased with progress.     Rehab Potential Good   OT Frequency 2x / week   OT Duration 8 weeks   OT Treatment/Interventions Self-care/ADL training;DME and/or AE instruction;Neuromuscular education;Therapeutic exercise;Therapist, nutritional;Therapeutic activities;Balance training;Patient/family education;Visual/perceptual remediation/compensation;Cognitive remediation/compensation   Plan d/c OT   OT Home Exercise Plan Initiated HEP for fine motor coordination, green putty HEP   Consulted and Agree with Plan of Care Patient      Patient will benefit from skilled therapeutic intervention in order to improve the following deficits and impairments:  Decreased activity tolerance, Decreased balance, Decreased coordination, Decreased cognition, Decreased strength, Impaired UE functional use, Impaired tone, Impaired vision/preception, Pain  Visit Diagnosis: Frontal lobe and executive function deficit  Other lack of coordination  Visuospatial deficit  Muscle weakness (generalized)  Hemiplegia, unspecified affecting left nondominant side ([facility])      G-Codes - 06/22/2016 1133    Functional Assessment Tool Used skilled clinical observation (have used 9 hole peg, and  dynamometer recently)   Functional Limitation Self care   Self Care Current Status (C1638) At least 1 percent but less than 20 percent impaired, limited or restricted   Self Care Goal Status (G5364) At least 20 percent but less than 40 percent impaired, limited or restricted   Self Care Discharge Status [phone]) At least 1 percent but less than 20 percent impaired, limited or restricted      Problem  List Patient Active Problem List   Diagnosis Date Noted  . Cognitive deficit due to recent stroke   . Left hemiparesis ([place]) 03/29/2016  . 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 Vascular headache   . Benign essential HTN   . Hyponatremia   . Acute blood loss anemia   . TBI (traumatic brain injury) ([place])   . Intracerebral hemorrhage 03/20/2016  . ICH (intracerebral hemorrhage) ([place]) 03/20/2016  . Colon polyps   . Osteoporosis   . 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   Remaining deficits: Memory, divided attention  Education / Equipment: HEP hand strength, self organizing skills, memory compensation Plan: Patient agrees to discharge.  Patient goals were partially met. Patient is being discharged due to meeting the stated rehab goals.  ?????      Mariah Milling, OTR/L 06/03/2016, 11:34 AM  [facility] [address] [place], [place], [address] Phone: [phone]   Fax:  [phone]  Name: Cynthia [name] MRN: [id number] Date of Birth: [birth year]

## 2016-06-07 ENCOUNTER — Encounter: Payer: Medicare Other | Admitting: Occupational Therapy

## 2016-06-08 ENCOUNTER — Other Ambulatory Visit: Payer: Self-pay | Admitting: Family Medicine

## 2016-06-09 ENCOUNTER — Ambulatory Visit: Payer: Medicare Other | Admitting: *Deleted

## 2016-06-09 ENCOUNTER — Encounter: Payer: Medicare Other | Admitting: Occupational Therapy

## 2016-06-09 DIAGNOSIS — R41841 Cognitive communication deficit: Secondary | ICD-10-CM

## 2016-06-09 NOTE — Therapy (Signed)
[facility] [address] [place], [place], [address] Phone: [phone]   Fax:  [phone]  Speech Language Pathology Treatment  Patient Details  Name: Cynthia [name] MRN: [id number] Date of Birth: [birth year] Referring Provider: Alysia Penna  Encounter Date: 06/09/2016      End of Session - 06/09/16 1429    Visit Number 12   Number of Visits 24   Date for SLP Re-Evaluation 07/08/16   Activity Tolerance Patient tolerated treatment well      Past Medical History:  Diagnosis Date  . Colon polyps   . Depression    history of  . Osteoporosis     No past surgical history on file.  There were no vitals filed for this visit.      Subjective Assessment - 06/09/16 1403    Subjective "I could relate to the lady in the book who had the hemorrhage"   Currently in Pain? Yes   Pain Score 1    Pain Location Back               ADULT SLP TREATMENT - 06/09/16 0001      General Information   Behavior/Cognition Alert;Cooperative;Pleasant mood     Treatment Provided   Treatment provided Cognitive-Linquistic     Pain Assessment   Pain Assessment 0-10   Pain Score 1    Pain Location lower back   Pain Descriptors / Indicators Constant   Pain Intervention(s) Monitored during session     Cognitive-Linquistic Treatment   Treatment focused on Cognition   Skilled Treatment Pt with explanation of D/C from OT d/t progress made; Pt agreed with increased emotional lability since CVA and the fact that this has "gotten worse", but was present prior to CVA, just not to this extent.  Pt continues with "TED" talks with detailed recall of task with improved overall comprehension/processing of information; should be noted pt was a visual learner prior to CVA, which may impact auditory learning to some extent.  Pt given written activity to complete at home while watching television or working in a distracting environment  to address divided attention and pt agreed to complete written information in this way.  Pt completed written information partially d/t time constraint with 65% accuracy and mod I; pt requested repetition x1 initially, but not again throughout written task.     Assessment / Recommendations / Plan   Plan Continue with current plan of care     Progression Toward Goals   Progression toward goals Progressing toward goals            SLP Short Term Goals - 06/09/16 1510      SLP SHORT TERM GOAL #1   Title pt will alternate attention between or within two mod complex linguistic tasks with 85% success over three sessions   Status Achieved     SLP SHORT TERM GOAL #2   Title pt will follow verbally given multi-unit multistep commands with modified independence PRN (requesting repeats, etc.) over two sessions   Status Achieved          SLP Long Term Goals - 06/09/16 1510      SLP LONG TERM GOAL #1   Title pt will demo improved performance on the Attention Processing Test Exercise X, when given 1-2 second delays between stimuli   Time 1   Period Weeks   Status On-going     SLP LONG TERM GOAL #2   Title pt will demo divided attention  between two mod complex linguistic tasks to produce 95% on both tasks over three sessions   Time 1   Period Weeks   Status On-going          Plan - 06/09/16 1509    Clinical Impression Statement Mild high level attention and auditory comprehension impairments remain (may consider visual learning previously with tasks) - continue skilled ST to maximize high level 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   Speech Therapy Frequency 1x /week   Duration 4 weeks   Treatment/Interventions Language facilitation;Cognitive reorganization;Compensatory techniques;Internal/external aids;SLP instruction and feedback;Functional tasks;Patient/family education;Environmental controls   Potential to  Achieve Goals Good      Patient will benefit from skilled therapeutic intervention in order to improve the following deficits and impairments:   Cognitive communication deficit    Problem List Patient Active Problem List   Diagnosis Date Noted  . Cognitive deficit due to recent stroke   . Left hemiparesis ([place]) 03/29/2016  . 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 Vascular headache   . Benign essential HTN   . Hyponatremia   . Acute blood loss anemia   . TBI (traumatic brain injury) ([place])   . Intracerebral hemorrhage 03/20/2016  . ICH (intracerebral hemorrhage) ([place]) 03/20/2016  . Colon polyps   . Osteoporosis   . HTN (hypertension) 01/08/2013    Marcio Hoque,PAT, M.S., CCC-SLP 06/09/2016, 3:12 PM  [facility] [address] [place], [place], [address] Phone: [phone]   Fax:  [phone]   Name: Cynthia [name] MRN: [id number] Date of Birth: 23-Jan-1946

## 2016-06-09 NOTE — Telephone Encounter (Signed)
Ok to refill 

## 2016-06-10 NOTE — Telephone Encounter (Signed)
R/X phoned in.

## 2016-06-10 NOTE — Telephone Encounter (Signed)
ok 

## 2016-06-14 ENCOUNTER — Encounter: Payer: Medicare Other | Admitting: Occupational Therapy

## 2016-06-16 ENCOUNTER — Encounter: Payer: Medicare Other | Admitting: Occupational Therapy

## 2016-06-16 ENCOUNTER — Ambulatory Visit: Payer: Medicare Other | Attending: Physical Medicine & Rehabilitation

## 2016-06-16 DIAGNOSIS — R41841 Cognitive communication deficit: Secondary | ICD-10-CM | POA: Diagnosis not present

## 2016-06-16 NOTE — Therapy (Signed)
[facility] [address] [place], [place], [address] Phone: [phone]   Fax:  [phone]  Speech Language Pathology Treatment  Patient Details  Name: Cynthia [name] MRN: [id number] Date of Birth: November 30, 1945 Referring Provider: Alysia Penna  Encounter Date: 06/16/2016      End of Session - 06/16/16 1446    Visit Number 13   Number of Visits 24   Date for SLP Re-Evaluation 07/08/16   SLP Start Time 0933   SLP Stop Time  1020   SLP Time Calculation (min) 47 min   Activity Tolerance Patient tolerated treatment well      Past Medical History:  Diagnosis Date  . Colon polyps   . Depression    history of  . Osteoporosis     No past surgical history on file.  There were no vitals filed for this visit.      Subjective Assessment - 06/16/16 1025    Subjective "I could relate to the lady in the book who had the hemorrhage" "I feel like I'm almost back (at baseline)."               ADULT SLP TREATMENT - 06/16/16 1025      General Information   Behavior/Cognition Alert;Cooperative;Pleasant mood     Pain Assessment   Pain Assessment 0-10   Pain Score 1    Pain Location lower back   Pain Descriptors / Indicators Constant   Pain Intervention(s) Monitored during session     Cognitive-Linquistic Treatment   Treatment focused on Cognition   Skilled Treatment Pt told SLP she is driving again - for two weeks. No difficulties with concentration or reaction time per pt. Discussion about her homework - pt demo'd recall of earlier details of SLP explanation of reason/rationale for challenging completion. Pt'd divided attention with auditory information and note taking was WNL today. When SLP suggested discharge today, pt stated she is satisfied with progress and thinks d/c is appropriate.     Assessment / Recommendations / Plan   Plan Continue with current plan of care     Progression Toward Goals    Progression toward goals Progressing toward goals            SLP Short Term Goals - 06/09/16 1510      SLP SHORT TERM GOAL #1   Title pt will alternate attention between or within two mod complex linguistic tasks with 85% success over three sessions   Status Achieved     SLP SHORT TERM GOAL #2   Title pt will follow verbally given multi-unit multistep commands with modified independence PRN (requesting repeats, etc.) over two sessions   Status Achieved          SLP Long Term Goals - 06/16/16 1007      SLP LONG TERM GOAL #1   Title pt will demo improved performance on the Attention Processing Test (APT) Exercise X, when given 1-2 second delays between stimuli   Time --   Period --   Status Partially Met  attention has improved, but decision to d/c not made until last 7 minutes of therapy, not enough time to reassess with APT     SLP LONG TERM GOAL #2   Title pt will demo divided attention between two mod complex linguistic tasks to produce 95% on both tasks over three sessions   Time --   Period --   Status Achieved  with auditory stimuli and not taking  Plan - 06/30/16 1447    Clinical Impression Statement Pt reports she feels like she is very near baseline with cognitive linguistics. SHe has been excellent with completion of homework and practicing at home. 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    Speech Therapy Frequency 1x /week   Duration 1 week   Treatment/Interventions Language facilitation;Cognitive reorganization;Compensatory techniques;Internal/external aids;SLP instruction and feedback;Functional tasks;Patient/family education;Environmental controls   Potential to Achieve Goals Good      Patient will benefit from skilled therapeutic intervention in order to improve the following  deficits and impairments:   Cognitive communication deficit      G-Codes - 2016/06/30 1719    Functional Assessment Tool Used NOMS - 6 (5-10% impaired)   Memory Goal Status (F7494) At least 1 percent but less than 20 percent impaired, limited or restricted   Memory Discharge Status (W9675) At least 1 percent but less than 20 percent impaired, limited or restricted      [place]  Visits from Start of Care: 13  Current functional level related to goals / functional outcomes: See goal update, above. Pt's memory and attention have both improved. Pt's auditory and written attention while taking notes on an auditory selection have improved dramatically. Pt completed homework routinely.   Remaining deficits: Mild retention deficit with auditory information, moreso when pt fatigued.   Education / Equipment: Compensations for pt's deficit areas during reading assessments.  Plan: Patient agrees to discharge.  Patient goals were partially met. Patient is being discharged due to being pleased with the current functional level.  ?????Pt to cont with ST tasks at home for the near future.       Problem List Patient Active Problem List   Diagnosis Date Noted  . Cognitive deficit due to recent stroke   . Left hemiparesis ([facility]) 03/29/2016  . 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 Vascular headache   . Benign essential HTN   . Hyponatremia   . Acute blood loss anemia   . TBI (traumatic brain injury) ([facility])   . Intracerebral hemorrhage 03/20/2016  . ICH (intracerebral hemorrhage) ([facility]) 03/20/2016  . Colon polyps   . Osteoporosis   . HTN (hypertension) 01/08/2013    [facility] ,[place], [place]  06/30/16, 5:19 PM  [facility] [address] [place], [place], [address] Phone: [phone]   Fax:  [phone]   Name: Cynthia [name] MRN: [id number] Date of Birth: 06/25/1946

## 2016-06-16 NOTE — Patient Instructions (Signed)
Continue to work with your divided attention with TED talks! See if you can have a "trial run" at your reading assessments, maybe 5-6 children your team has already assessed.

## 2016-06-18 LAB — HM MAMMOGRAPHY

## 2016-06-20 ENCOUNTER — Ambulatory Visit (INDEPENDENT_AMBULATORY_CARE_PROVIDER_SITE_OTHER): Payer: Medicare Other | Admitting: Neurology

## 2016-06-20 ENCOUNTER — Encounter: Payer: Self-pay | Admitting: Neurology

## 2016-06-20 ENCOUNTER — Encounter: Payer: Medicare Other | Admitting: Occupational Therapy

## 2016-06-20 VITALS — BP 137/80 | Ht 66.5 in | Wt 151.6 lb

## 2016-06-20 DIAGNOSIS — I611 Nontraumatic intracerebral hemorrhage in hemisphere, cortical: Secondary | ICD-10-CM

## 2016-06-20 NOTE — Progress Notes (Signed)
[facility] [address] [place]. [place] [address] [phone]       OFFICE FOLLOW-UP NOTE  Cynthia. Cynthia [name] Date of Birth:  [birth year] Medical Record Number:  [id number]   HPI: Cynthia [name] is a 9 year Caucasian lady seen today for the first office follow-up visit following hospital admission for intracerebral hemorrhage in July 1535. 70 year old right-handed woman who presented to the [facility] Emergency department after being sent from her doctor's office due to concern for possible stroke.The patient  reported that the night of 03/19/2016 (LKW, time unknown) she developed a sense of feeling disoriented and having difficulty speaking. When asked about her speech, she described that she had trouble focusing her thoughts and getting her words out. She reported that her sister also noticed that she seemed slow to respond to questions. She felt as if her speech was somewhat slurred as well. She did not endorse any weakness, numbness, vision changes, difficulty swallowing, or balance impairments. However, she had noticed that she will find her left arm in unusual postures and positions without being aware that it is doing anything. When she looked at it, she was able to control it without a problem and again denied any weakness or sensory changes. She  also had a headache for the past 2 days.  She denies any similar previous episodes. She denied any recent trauma to the head. She did note that she was working with her brother at her home and had been up on a ladder. She was getting off the ladder, she thought she was on the ground but instead was on the second step of the latter when she stumbled backwards. She says that she struck her bottom on a nearby chair, then fell to the floor landing on her bottom. She did not strike her head. She has not had any recent illness. She does not use daily antiplatelet therapy or anticoagulation. She uses occasional Advil for headache and pain  and has taken a total of six 200 mg tablets since last night. She also reports that she took a baby aspirin earlier today as recommended by her sister because of her symptoms.CT brain on admission showed a large right frontal ICH. Patient was not administered IV t-PA secondary to [place]. She was admitted to the neuro ICU for further evaluation and treatment. Her blood pressure was tightly controlled initially with IV drip and subsequently with oral medications. Follow-up CT scan showed stable appearance of the hemorrhage. There was mild cytotoxic edema but no hydrocephalus or any hemorrhage expansion. CT angiogram and CT venogram did not show any aneurysms or venous sinus thrombosis but there were engorged vessels in the region of the hemorrhage raising concern about a small AVM hence cerebral catheter angiogram was performed and findings of which are also indeterminate and showed abnormal right frontopolar branches .She had persistent left-sided weakness but her speech and word finding difficulties improved. She was transferred to the neurology floor in a condition remained stable. She was seen by physical occupational therapist and felt to be a good candidate for inpatient rehabilitation. She was accepted for transfer to rehabilitation in stable condition. She states she's done well since discharge. She has patient outpatient physical and occupational therapy. She is back to her baseline and has no complaints or deficits from her intracerebral hemorrhage. She does feel she gets tired easily and at times her left-sided balance and coordination may be off. She states her blood pressure is well controlled and today it is 137/87  in office. She is wondering if she needs follow-up imaging studies. She has no complaints today.   ROS:   14 system review of systems is positive for  back pain only and all other systems negative  PMH:  Past Medical History:  Diagnosis Date  . Colon polyps   . Depression    history  of  . Osteoporosis   . Stroke [facility])     Social History:  Social History   Social History  . Marital status: Married    Spouse name: N/A  . Number of children: N/A  . Years of education: N/A   Occupational History  . Not on file.   Social History Main Topics  . Smoking status: Never Smoker  . Smokeless tobacco: Never Used  . Alcohol use 0.6 oz/week    1 Glasses of wine per week     Comment: 1-2 beers/wine in evening  . Drug use: No  . Sexual activity: Not on file     Comment: Married since 2005   Other Topics Concern  . Not on file   Social History Narrative  . No narrative on file    Medications:   Current Outpatient Prescriptions on File Prior to Visit  Medication Sig Dispense Refill  . ALPRAZolam (XANAX) 0.25 MG tablet TAKE 1 TABLET BY MOUTH 3 TIMES A DAY AS NEEDED 90 tablet 0  . cetirizine (ZYRTEC) 10 MG tablet Take 10 mg by mouth daily as needed for allergies.     Marland Kitchen EPIPEN 2-PAK 0.3 MG/0.3ML SOAJ injection INJECT 0.3 MLS (0.3 MG TOTAL) INTO THE MUSCLE ONCE. 2 Device 0  . famotidine (PEPCID) 20 MG tablet TAKE 1 TABLET (20 MG TOTAL) BY MOUTH 2 (TWO) TIMES DAILY. 60 tablet 11  . ferrous sulfate 325 (65 FE) MG tablet Take 325 mg by mouth daily as needed (to improve hemoglobin).     Marland Kitchen losartan (COZAAR) 50 MG tablet Take 1 tablet (50 mg total) by mouth daily. 30 tablet 3   No current facility-administered medications on file prior to visit.     Allergies:   Allergies  Allergen Reactions  . Other Anaphylaxis    FIRE ANTS  . Penicillins Rash    Has patient had a PCN reaction causing immediate rash, facial/tongue/throat swelling, SOB or lightheadedness with hypotension: Unknown Has patient had a PCN reaction causing severe rash involving mucus membranes or skin necrosis: No Has patient had a PCN reaction that required hospitalization No Has patient had a PCN reaction occurring within the last 10 years: No If all of the above answers are "NO", then may proceed with  Cephalosporin use.     Physical Exam General: well developed, well nourished middle aged Caucasian lady, seated, in no evident distress Head: head normocephalic and atraumatic.  Neck: supple with no carotid or supraclavicular bruits Cardiovascular: regular rate and rhythm, no murmurs Musculoskeletal: no deformity Skin:  no rash/petichiae Vascular:  Normal pulses all extremities Vitals:   06/20/16 1417  BP: 137/80   Neurologic Exam Mental Status: Awake and fully alert. Oriented to place and time. Recent and remote memory intact. Attention span, concentration and fund of knowledge appropriate. Mood and affect appropriate.  Cranial Nerves: Fundoscopic exam reveals sharp disc margins. Pupils equal, briskly reactive to light. Extraocular movements full without nystagmus. Visual fields full to confrontation. Hearing intact. Facial sensation intact. Face, tongue, palate moves normally and symmetrically.  Motor: Normal bulk and tone. Normal strength in all tested extremity muscles. Sensory.: intact to touch ,pinprick .  position and vibratory sensation.  Coordination: Rapid alternating movements normal in all extremities. Finger-to-nose and heel-to-shin performed accurately bilaterally. Gait and Station: Arises from chair without difficulty. Stance is normal. Gait demonstrates normal stride length and balance . Able to heel, toe and tandem walk without difficulty.  Reflexes: 1+ and symmetric. Toes downgoing.   NIHSS  0 Modified Rankin  0  ASSESSMENT: 57 year Caucasian lady with parenchymal right frontal intracerebral hemorrhage in July 2017 of indeterminate etiology. She's been clinically quite well without any deficits.    PLAN: I had a long discussion with the patient with regards to her recent   intracerebral hemorrhage which is of indeterminate etiology. I recommend repeating MRI scan the brain with and without contrast and if any suspicious AVM-like vessel is found she may further need  diagnostic cerebral catheter angiogram. She was advised to continue strict blood pressure control and resume all prior activities gradually. She will return for follow-up in the future only as necessary. Greater than 50% of time during this 25 minute visit was spent on counseling,explanation of diagnosis, planning of further management, discussion with patient and family and coordination of care Cynthia Contras, MD  [facility] [address] [place], [place] [address]  Phone [phone] Fax [phone] Note: This document was prepared with digital dictation and possible smart phrase technology. Any transcriptional errors that result from this process are unintentional

## 2016-06-20 NOTE — Patient Instructions (Signed)
I had a long discussion with the patient with regards to her recent   intracerebral hemorrhage which is of indeterminate etiology. I recommend repeating MRI scan the brain with and without contrast and if any suspicious AVM-like vessel is found she may further need diagnostic cerebral catheter angiogram. She was advised to continue strict blood pressure control and resume all prior activities gradually. She will return for follow-up in the future only as necessary.  Intracerebral Hemorrhage An intracerebral hemorrhage occurs when a blood vessel in the brain leaks or bursts. Areas of the brain that should receive blood, oxygen, and nutrients from the damaged blood vessel are deprived of blood flow. This causes areas of the brain to become damaged. Damage also occurs to areas of the brain where the leaked blood accumulates and presses on normal tissue. This is a medical emergency. This can cause permanent damage and loss of brain function. Early and aggressive treatment leads to better recovery.  CAUSES   Head injury (trauma).  A ballooning of a weak section in a blood vessel (aneurysm).  Hardened, thin blood vessels. Blood vessel walls lined with plaque become thin and hardened. These hardened, thin artery walls can crack open and allow blood to flow out of the blood vessel.  An abnormal formation of a blood vessel (arteriovenous malformation). This condition results in an abnormal tangle of thin-walled blood vessels.  Protein buildup on the artery walls of the brain (amyloid angiopathy). Sometimes the cause of an intracerebral hemorrhage is not known. RISK FACTORS   High blood pressure (hypertension).  Female gender.  Advancing age.  Moderate or heavy use of alcohol.  Taking blood-thinning medicines (anticoagulants). SIGNS AND SYMPTOMS  Sudden, severe headache with no known cause. The headache is often described as the worst headache ever experienced.  Sudden confusion.  Sudden  trouble speaking (aphasia) or understanding.  Seizures.  Sudden nausea and vomiting.  Hypertension.  Sudden weakness or numbness of the face, arm, or leg, especially on one side of the body.  Sudden trouble seeing in one or both eyes.  Sudden trouble walking or difficulty moving arms or legs.  Sudden dizziness.  Sudden loss of balance or coordination. DIAGNOSIS  An intracerebral hemorrhage is diagnosed with:  A history and physical exam.  Imaging tests to view the brain. These may include a CT scan or MRI.  Imaging tests to view vessels in the brain. These may include computed tomography angiography (CTA) or a magnetic resonance angiogram (MRA). TREATMENT  An intracerebral hemorrhage requires emergency treatment. The goals of treatment are to stop the bleeding, control pressure in the brain, and relieve symptoms. Treatment may include:   Medicines to manage:  Blood pressure.  Pain.  Nausea or vomiting.  Seizures.  Fever.  Other medicines, blood products, or vitamin K may also be given to control the bleeding.  A tube (shunt) in the brain to relieve pressure.  Assisted breathing (ventilation).  Surgery to stop bleeding, remove a blood clot or tumor, and reduce pressure. Surgeries to do this include:  Craniotomy. This is a surgery to remove part of the skull to reduce pressure.  Stereotactic aspiration. During this procedure, a needle or syringe is inserted into the brain to remove blood. HOME CARE INSTRUCTIONS  Take medicines only as directed by your health care provider.  Eat healthy foods as directed by your health care provider:  A diet low in salt (sodium), saturated fat, trans fat, and cholesterol may be recommended to manage your blood pressure.  Foods  may need to be soft or pureed, or small bites may need to be taken in order to avoid aspirating or choking.  If studies show that your ability to swallow safely has been affected, you may need to seek  help from specialists such as a dietitian, speech and language pathologist, or an occupational therapist. These health care providers can teach you how to safely get the nutrition your body needs.  Follow physical activity guidelines as directed by your health care team.  Avoid heavy alcohol use.  Manage any other health care conditions you may have, if applicable.  A safe home environment is important to reduce the risk of falls. Your health care provider may arrange for specialists to evaluate your home. Having grab bars in the bedroom and bathroom is often important. Your health care provider may arrange for special equipment to be used at home, such as raised toilets and a seat for the shower.  Do physical, occupational, and speech therapy as directed by your health care provider. Ongoing therapy may be needed to maximize your recovery.  Use a walker or a cane at all times if directed by your health care provider  Keep all follow-up visits as directed by your health care provider. This is important. This includes any referrals, physical therapy, rehabilitation, and laboratory tests. SEEK IMMEDIATE MEDICAL CARE IF:   You have a sudden, severe headache with no known cause.  You have sudden:  Confusion.  Trouble speaking (aphasia) or understanding speech.  Weakness or numbness of the face, arm, or leg, especially on one side of the body.  Trouble seeing in one or both eyes.  Trouble walking or difficulty moving arms or legs.  Dizziness.  You have a sudden loss of balance or coordination.  You have a seizure.  You have a partial or total loss of consciousness. These symptoms may represent a serious problem that is an emergency. Do not wait to see if the symptoms will go away. Get medical help right away. Call your local emergency services (911 in the [place] Do not drive yourself to the hospital.   This information is not intended to replace advice given to you by your health  care provider. Make sure you discuss any questions you have with your health care provider.   Document Released: 03/26/2014 Document Reviewed: 03/26/2014 Elsevier Interactive Patient Education [employer].

## 2016-06-21 ENCOUNTER — Encounter: Payer: Medicare Other | Admitting: Occupational Therapy

## 2016-06-22 ENCOUNTER — Telehealth: Payer: Self-pay | Admitting: Family Medicine

## 2016-06-22 DIAGNOSIS — Z1231 Encounter for screening mammogram for malignant neoplasm of breast: Secondary | ICD-10-CM

## 2016-06-22 DIAGNOSIS — M8580 Other specified disorders of bone density and structure, unspecified site: Secondary | ICD-10-CM

## 2016-06-22 NOTE — Telephone Encounter (Signed)
Pt called and states that it is time to schedule her bone density and mammogram. Her Last bone density was 07/09/14 and last mammo was 06/10/15. Will go ahead and place orders to have these done for her.

## 2016-06-23 ENCOUNTER — Encounter: Payer: Medicare Other | Admitting: Occupational Therapy

## 2016-06-28 ENCOUNTER — Encounter: Payer: Medicare Other | Admitting: Occupational Therapy

## 2016-06-28 ENCOUNTER — Ambulatory Visit (HOSPITAL_BASED_OUTPATIENT_CLINIC_OR_DEPARTMENT_OTHER): Payer: Medicare Other | Admitting: Physical Medicine & Rehabilitation

## 2016-06-28 ENCOUNTER — Encounter: Payer: Self-pay | Admitting: Physical Medicine & Rehabilitation

## 2016-06-28 ENCOUNTER — Encounter: Payer: Medicare Other | Attending: Physical Medicine & Rehabilitation

## 2016-06-28 VITALS — BP 132/82 | HR 77 | Resp 14

## 2016-06-28 DIAGNOSIS — I69398 Other sequelae of cerebral infarction: Secondary | ICD-10-CM | POA: Diagnosis not present

## 2016-06-28 DIAGNOSIS — Z5189 Encounter for other specified aftercare: Secondary | ICD-10-CM | POA: Insufficient documentation

## 2016-06-28 DIAGNOSIS — I611 Nontraumatic intracerebral hemorrhage in hemisphere, cortical: Secondary | ICD-10-CM

## 2016-06-28 DIAGNOSIS — I69254 Hemiplegia and hemiparesis following other nontraumatic intracranial hemorrhage affecting left non-dominant side: Secondary | ICD-10-CM | POA: Diagnosis present

## 2016-06-28 DIAGNOSIS — I10 Essential (primary) hypertension: Secondary | ICD-10-CM | POA: Insufficient documentation

## 2016-06-28 DIAGNOSIS — R269 Unspecified abnormalities of gait and mobility: Secondary | ICD-10-CM | POA: Diagnosis not present

## 2016-06-28 NOTE — Progress Notes (Signed)
Subjective:    Patient ID: Cynthia [name], female    DOB: [birth year], 70 y.o.   MRN: [id number]  HPI  70 year old female who developed right frontal hemorrhage, 03/20/2016. Patient was transferred to the inpatient stroke rehabilitation unit at [facility] and stayed from 03/24/2016 to 04/07/2016. Posthospitalization. She has followed up with her primary care physician, Dr. Dennard Schaumann, as well as neurology, Dr. Leonie Man, as well as this office. While hospitalized, she had urinary incontinence as well as left-sided neglect as well as difficulty with divided attention, as well as orientation. These issues have resolved over time. She has finished outpatient PT and speech therapy. She is independent with all her self-care and mobility. She has resumed driving during daytime hours and not [place].   Neuro f/u 10/9, repeat MRI with contrast recommended, to look for vascular abnormailty, schedule for 10/27  Patient reports driving without difficulties. She was able to parallel park downtown in a congested area.  Finished outpatient speech therapy. Now able to perform divided attention tasks  Patient still taking Pepcid, which was started at the hospital. She reports no heartburn or indigestion symptoms. She is taking Pepcid 20 mg twice a day.  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 Pain Inventory Average Pain 1 Pain Right Now 1 My pain is dull and aching  In the last 24 hours, has pain interfered with the following? General activity 0 Relation with others 0 Enjoyment of life 0 What TIME of day is your pain at its worst? morning Sleep (in general) Good  Pain is worse with: bending Pain improves with: rest and heat/ice Relief from Meds: 5  Mobility walk without assistance how many minutes can you walk? 45 ability to climb steps?  yes do you drive?  yes Do you have any goals in this area?   no  Function not employed: date last employed 02/2016  Neuro/Psych No problems in this area  Prior Studies Any changes since last visit?  no  Physicians involved in your care Any changes since last visit?  no   Family History  Problem Relation Age of Onset  . Heart disease Mother   . Cancer Mother     lung  . Stroke Paternal Grandmother    Social History   Social History  . Marital status: Married    Spouse name: N/A  . Number of children: N/A  . Years of education: N/A   Social History Main Topics  . Smoking status: Never Smoker  . Smokeless tobacco: Never Used  . Alcohol use 0.6 oz/week    1 Glasses of wine per week     Comment: 1-2 beers/wine in evening  . Drug use: No  . Sexual activity: Not Asked     Comment: Married since 2005   Other Topics Concern  . None   Social History Narrative  . None   No past surgical history on file. Past Medical History:  Diagnosis Date  . Colon polyps   . Depression    history of  . Osteoporosis   . Stroke (HCC)    BP 132/82   Pulse 77   Resp 14   SpO2 97%   Opioid Risk Score:   Fall Risk Score:  `1  Depression screen PHQ 2/9  Depression screen PHQ 2/9 05/17/2016  Decreased Interest 0  Down, Depressed, Hopeless 0  PHQ - 2 Score 0  Review of Systems  All other systems reviewed and are negative.      Objective:   Physical Exam  Constitutional: She is oriented to person, place, and time. She appears well-developed and well-nourished.  HENT:  Head: Normocephalic and atraumatic.  Eyes: Conjunctivae and EOM are normal. Pupils are equal, round, and reactive to light.  Neck: Normal range of motion.  Musculoskeletal: Normal range of motion.  Neurological: She is alert and oriented to person, place, and time. She has normal reflexes. No sensory deficit. Gait abnormal.  Motor strength is 5/5 in the right deltoid, biceps, triceps, grip, hip flexor, knee extensor, ankle dorsi flexor and plantar flexor, 4  plus in the left deltoid, biceps, triceps, grip, hip flexor, knee extensor, ankle dorsi flexor. Sensation intact to light touch in bilateral upper and lower limbs  Patient is able to do standard gait, as well as toe walking and heel walking. Has mild difficulty with tandem gait.  Negative Romberg  Psychiatric: She has a normal mood and affect.  Nursing note and vitals reviewed.         Assessment & Plan:  1. History of right frontal hemorrhage has repeat MRI scheduled by neurology to look for underlying vascular abnormalities. This has been scheduled 07/08/2016. From a rehabilitation standpoint, she has made excellent gains. She is back to a modified independent level. She is ready to resume nighttime driving as well as Interstate driving. However, first time out, I am recommending another driver with her to monitor.  Physical medicine and rehabilitation follow-up on an as-needed basis. Follow-up with primary care as well as neurology.  Patient should be ready to return to work 09/12/2016. Would be happy to write a letter if need be

## 2016-06-28 NOTE — Patient Instructions (Signed)
May resume nighttime and Interstate driving. However, I recommend the first time you do this that you have another driver with you.

## 2016-06-29 ENCOUNTER — Encounter: Payer: Self-pay | Admitting: Family Medicine

## 2016-06-30 ENCOUNTER — Encounter: Payer: Medicare Other | Admitting: Occupational Therapy

## 2016-07-08 ENCOUNTER — Ambulatory Visit
Admission: RE | Admit: 2016-07-08 | Discharge: 2016-07-08 | Disposition: A | Discharge status: Home / Self Care | Payer: Medicare Other | Source: Ambulatory Visit | Attending: Neurology | Admitting: Neurology

## 2016-07-08 DIAGNOSIS — I611 Nontraumatic intracerebral hemorrhage in hemisphere, cortical: Secondary | ICD-10-CM | POA: Diagnosis not present

## 2016-07-08 MED ORDER — GADOBENATE DIMEGLUMINE 529 MG/ML IV SOLN
14.0000 mL | Freq: Once | INTRAVENOUS | Status: AC | PRN
  Administered 2016-07-08: 14 mL via INTRAVENOUS

## 2016-07-11 ENCOUNTER — Telehealth: Payer: Self-pay | Admitting: Neurology

## 2016-07-11 NOTE — Telephone Encounter (Signed)
Pt called in for MRI results.  [phone]

## 2016-07-11 NOTE — Telephone Encounter (Signed)
RN spoke with patient that a message will be sent to Dr. Leonie Man about her MRI and MRA. Rn stated there is nothing to worry about. Rn stated Dr. Leonie Man is working in the hospital this week. Patient verbalized understanding.

## 2016-07-12 NOTE — Telephone Encounter (Signed)
I spoke with the patient and gave her results of MRI scan of the brain and MRA of the brain showed expected evolutionary changes in the hematoma without underlying masses or abnormal vessels. She voiced understanding.

## 2016-07-15 ENCOUNTER — Telehealth (HOSPITAL_COMMUNITY): Payer: Self-pay

## 2016-07-15 NOTE — Telephone Encounter (Signed)
Called to schedule f/u angio, left message for pt to return call. AW 

## 2016-07-19 ENCOUNTER — Telehealth: Payer: Self-pay | Admitting: Neurology

## 2016-07-19 ENCOUNTER — Telehealth (HOSPITAL_COMMUNITY): Payer: Self-pay

## 2016-07-19 NOTE — Telephone Encounter (Signed)
I called the patient and explained to her the need to do repeat cerebral catheter angiogram since the previous study was indeterminate and may need to answer the question whether there is a small underlying AVM or not

## 2016-07-19 NOTE — Telephone Encounter (Signed)
Pt called in stating Dr. Estanislado Pandy is wanting to the pt to have another angiogram. She wants to know if she needs to have another one. Please call

## 2016-07-19 NOTE — Telephone Encounter (Signed)
Called to schedule f/u angiogram. Pt wants to know why she has to have another angiogram even though she just had a MRI/MRA. She thought that the MRI would cover her f/u's. She doesn't want to go through another angio if she doesn't have to. Told pt that I would speak with Dr. Estanislado Pandy and get back with her. Pt is ok with me calling her back with an answer. AW

## 2016-07-23 ENCOUNTER — Other Ambulatory Visit: Payer: Self-pay | Admitting: Family Medicine

## 2016-07-25 NOTE — Telephone Encounter (Signed)
Medication called to pharmacy. 

## 2016-07-25 NOTE — Telephone Encounter (Signed)
Approved. #[id number] 0. 

## 2016-07-25 NOTE — Telephone Encounter (Signed)
Ok to refill??      LOV 05/05/16 and LRF - 06/10/2016

## 2016-07-28 ENCOUNTER — Other Ambulatory Visit (HOSPITAL_COMMUNITY): Payer: Self-pay | Admitting: Interventional Radiology

## 2016-07-28 DIAGNOSIS — I611 Nontraumatic intracerebral hemorrhage in hemisphere, cortical: Secondary | ICD-10-CM

## 2016-08-02 ENCOUNTER — Other Ambulatory Visit: Payer: Self-pay | Admitting: Radiology

## 2016-08-03 ENCOUNTER — Other Ambulatory Visit: Payer: Self-pay | Admitting: General Surgery

## 2016-08-05 ENCOUNTER — Other Ambulatory Visit (HOSPITAL_COMMUNITY): Payer: Self-pay | Admitting: Interventional Radiology

## 2016-08-05 ENCOUNTER — Encounter (HOSPITAL_COMMUNITY): Payer: Self-pay

## 2016-08-05 ENCOUNTER — Ambulatory Visit (HOSPITAL_COMMUNITY)
Admission: RE | Admit: 2016-08-05 | Discharge: 2016-08-05 | Disposition: A | Discharge status: Home / Self Care | Payer: Medicare Other | Source: Ambulatory Visit | Attending: Interventional Radiology | Admitting: Interventional Radiology

## 2016-08-05 DIAGNOSIS — Z8673 Personal history of transient ischemic attack (TIA), and cerebral infarction without residual deficits: Secondary | ICD-10-CM | POA: Insufficient documentation

## 2016-08-05 DIAGNOSIS — I611 Nontraumatic intracerebral hemorrhage in hemisphere, cortical: Secondary | ICD-10-CM

## 2016-08-05 DIAGNOSIS — I161 Hypertensive emergency: Secondary | ICD-10-CM | POA: Insufficient documentation

## 2016-08-05 DIAGNOSIS — Z880 Allergy status to penicillin: Secondary | ICD-10-CM | POA: Insufficient documentation

## 2016-08-05 DIAGNOSIS — I610 Nontraumatic intracerebral hemorrhage in hemisphere, subcortical: Secondary | ICD-10-CM | POA: Diagnosis present

## 2016-08-05 HISTORY — PX: IR GENERIC HISTORICAL: IMG1180011

## 2016-08-05 LAB — CBC
HEMATOCRIT: 37.7 % (ref 36.0–46.0)
Hemoglobin: 12.7 g/dL (ref 12.0–15.0)
MCH: 30.2 pg (ref 26.0–34.0)
MCHC: 33.7 g/dL (ref 30.0–36.0)
MCV: 89.5 fL (ref 78.0–100.0)
PLATELETS: 298 10*3/uL (ref 150–400)
RBC: 4.21 MIL/uL (ref 3.87–5.11)
RDW: 13.1 % (ref 11.5–15.5)
WBC: 5.2 10*3/uL (ref 4.0–10.5)

## 2016-08-05 LAB — APTT: APTT: 34 s (ref 24–36)

## 2016-08-05 LAB — BASIC METABOLIC PANEL
Anion gap: 10 (ref 5–15)
BUN: 17 mg/dL (ref 6–20)
CALCIUM: 9.4 mg/dL (ref 8.9–10.3)
CO2: 26 mmol/L (ref 22–32)
CREATININE: 0.8 mg/dL (ref 0.44–1.00)
Chloride: 105 mmol/L (ref 101–111)
GFR calc Af Amer: >60 mL/min (ref >60)
GLUCOSE: 90 mg/dL (ref 65–99)
Potassium: 4.2 mmol/L (ref 3.5–5.1)
SODIUM: 141 mmol/L (ref 135–145)

## 2016-08-05 LAB — PROTIME-INR
INR: 0.98
PROTHROMBIN TIME: 13 s (ref 11.4–15.2)

## 2016-08-05 MED ORDER — MIDAZOLAM HCL 2 MG/2ML IJ SOLN
INTRAMUSCULAR | Status: AC | PRN
  Administered 2016-08-05: 1 mg via INTRAVENOUS

## 2016-08-05 MED ORDER — MIDAZOLAM HCL 2 MG/2ML IJ SOLN
INTRAMUSCULAR | Status: AC
  Filled 2016-08-05: qty 2

## 2016-08-05 MED ORDER — FENTANYL CITRATE (PF) 100 MCG/2ML IJ SOLN
INTRAMUSCULAR | Status: AC | PRN
  Administered 2016-08-05: 25 ug via INTRAVENOUS

## 2016-08-05 MED ORDER — HEPARIN SODIUM (PORCINE) 1000 UNIT/ML IJ SOLN
INTRAMUSCULAR | Status: AC
  Filled 2016-08-05: qty 1

## 2016-08-05 MED ORDER — SODIUM CHLORIDE 0.9 % IV SOLN
Freq: Once | INTRAVENOUS | Status: AC
  Administered 2016-08-05: 09:00:00 via INTRAVENOUS

## 2016-08-05 MED ORDER — SODIUM CHLORIDE 0.9 % IV SOLN: INTRAVENOUS | Status: AC

## 2016-08-05 MED ORDER — FENTANYL CITRATE (PF) 100 MCG/2ML IJ SOLN
INTRAMUSCULAR | Status: AC
  Filled 2016-08-05: qty 2

## 2016-08-05 MED ORDER — IOPAMIDOL (ISOVUE-300) INJECTION 61%
INTRAVENOUS | Status: AC
  Administered 2016-08-05: 75 mL
  Filled 2016-08-05: qty 150

## 2016-08-05 MED ORDER — LIDOCAINE HCL 1 % IJ SOLN
INTRAMUSCULAR | Status: AC
  Administered 2016-08-05: 20 mL via SUBCUTANEOUS
  Filled 2016-08-05: qty 20

## 2016-08-05 MED ORDER — HEPARIN SODIUM (PORCINE) 1000 UNIT/ML IJ SOLN
INTRAMUSCULAR | Status: AC | PRN
  Administered 2016-08-05: 1000 [IU] via INTRAVENOUS

## 2016-08-05 NOTE — Sedation Documentation (Signed)
Patient is resting comfortably. 

## 2016-08-05 NOTE — Discharge Instructions (Signed)
Cerebral Angiogram, Care After Refer to this sheet in the next few weeks. These instructions provide you with information on caring for yourself after your procedure. Your health care provider may also give you more specific instructions. Your treatment has been planned according to current medical practices, but problems sometimes occur. Call your health care provider if you have any problems or questions after your procedure. What can I expect after the procedure? After your procedure, it is typical to have the following:  Bruising at the catheter insertion site that usually fades within 1-2 weeks.  Blood collecting in the tissue (hematoma) that may be painful to the touch. It should usually decrease in size and tenderness within 1-2 weeks.  A mild headache. Follow these instructions at home:  Take medicines only as directed by your health care provider.  You may shower 24-48 hours after the procedure or as directed by your health care provider. Remove the bandage (dressing) and gently wash the site with plain soap and water. Pat the area dry with a clean towel. Do not rub the site, because this may cause bleeding.  Do not take baths, swim, or use a hot tub until your health care provider approves.  Check your insertion site every day for redness, swelling, or drainage.  Do not apply powder or lotion to the site.  Do not lift over 10 lb (4.5 kg) for 5 days after your procedure or as directed by your health care provider.  Ask your health care provider when it is okay to:  Return to work or school.  Resume usual physical activities or sports.  Resume sexual activity.  Do not drive home if you are discharged the same day as the procedure. Have someone else drive you.  You may drive 24 hours after the procedure unless otherwise instructed by your health care provider.  Do not operate machinery or power tools for 24 hours after the procedure or as directed by your health care  provider.  If your procedure was done as an outpatient procedure, which means that you went home the same day as your procedure, a responsible adult should be with you for the first 24 hours after you arrive home.  Keep all follow-up visits as directed by your health care provider. This is important. Contact a health care provider if:  You have a fever.  You have chills.  You have increased bleeding from the catheter insertion site. Hold pressure on the site. Get help right away if:  You have vision changes or loss of vision.  You have numbness or weakness on one side of your body.  You have difficulty talking, or you have slurred speech or cannot speak (aphasia).  You feel confused or have difficulty remembering.  You have unusual pain at the catheter insertion site.  You have redness, warmth, or swelling at the catheter insertion site.  You have drainage (other than a small amount of blood on the dressing) from the catheter insertion site.  The catheter insertion site is bleeding, and the bleeding does not stop after 30 minutes of holding steady pressure on the site. These symptoms may represent a serious problem that is an emergency. Do not wait to see if the symptoms will go away. Get medical help right away. Call your local emergency services (911 in [place] Do not drive yourself to the hospital.  This information is not intended to replace advice given to you by your health care provider. Make sure you discuss any questions  you have with your health care provider. Document Released: 01/13/2014 Document Revised: 02/04/2016 Document Reviewed: 09/11/2013 Elsevier Interactive Patient Education  2017 [place].     Femoral Site Care Introduction Refer to this sheet in the next few weeks. These instructions provide you with information about caring for yourself after your procedure. Your health care provider may also give you more specific instructions. Your treatment has  been planned according to current medical practices, but problems sometimes occur. Call your health care provider if you have any problems or questions after your procedure. What can I expect after the procedure? After your procedure, it is typical to have the following:  Bruising at the site that usually fades within 1-2 weeks.  Blood collecting in the tissue (hematoma) that may be painful to the touch. It should usually decrease in size and tenderness within 1-2 weeks. Follow these instructions at home:  Take medicines only as directed by your health care provider.  You may shower 24-48 hours after the procedure or as directed by your health care provider. Remove the bandage (dressing) and gently wash the site with plain soap and water. Pat the area dry with a clean towel. Do not rub the site, because this may cause bleeding.  Do not take baths, swim, or use a hot tub until your health care provider approves.  Check your insertion site every day for redness, swelling, or drainage.  Do not apply powder or lotion to the site.  Limit use of stairs to twice a day for the first 2-3 days or as directed by your health care provider.  Do not squat for the first 2-3 days or as directed by your health care provider.  Do not lift over 10 lb (4.5 kg) for 5 days after your procedure or as directed by your health care provider.  Ask your health care provider when it is okay to:  Return to work or school.  Resume usual physical activities or sports.  Resume sexual activity.  Do not drive home if you are discharged the same day as the procedure. Have someone else drive you.  You may drive 24 hours after the procedure unless otherwise instructed by your health care provider.  Do not operate machinery or power tools for 24 hours after the procedure or as directed by your health care provider.  If your procedure was done as an outpatient procedure, which means that you went home the same day as  your procedure, a responsible adult should be with you for the first 24 hours after you arrive home.  Keep all follow-up visits as directed by your health care provider. This is important. Contact a health care provider if:  You have a fever.  You have chills.  You have increased bleeding from the site. Hold pressure on the site. Get help right away if:  You have unusual pain at the site.  You have redness, warmth, or swelling at the site.  You have drainage (other than a small amount of blood on the dressing) from the site.  The site is bleeding, and the bleeding does not stop after 30 minutes of holding steady pressure on the site.  Your leg or foot becomes pale, cool, tingly, or numb. This information is not intended to replace advice given to you by your health care provider. Make sure you discuss any questions you have with your health care provider. Document Released: 05/02/2014 Document Revised: 02/04/2016 Document Reviewed: 03/18/2014  2017 Else[employer]

## 2016-08-05 NOTE — Progress Notes (Signed)
Report was given/ pt exosealed, was to have bed rest 3 hrs/ order states 4hr, Pam turpin PA was called and orders were clarified. Pt to have bedrest 3 hrs.

## 2016-08-05 NOTE — Sedation Documentation (Addendum)
Waiting at RN desk to speak to Dr Estanislado Pandy. Family at bedside

## 2016-08-05 NOTE — H&P (Signed)
Chief Complaint: Patient was seen in consultation today for cerebral arteriogram at the request of Dr Antony Contras  Referring Physician(s): Dr Antony Contras  Supervising Physician: Luanne Bras  Patient Status: [facility] - Out-pt  History of Present Illness: Cynthia [name] is a 70 y.o. female   CVA Intracranial Hemorrhage 03/2016 Follow up studies confirmed stable Hemorrhage but CT did reveal engorged vessels in region of hemorrhage--concerning for Arteriovenous malformation.  03/23/2016 Angio: Prominent frontal polar branch of the right anterior cerebral artery A1 segment extending into the anterior inferior portion of the larger area of hyperperfusion. This could signify a potential feeder to a vascular malformation not angiographically visualized on today's study  Pt has done well after discharge; and follows with Dr Leonie Man Denies headache; numbness; dizziness Denies visual or speech issues Gait steady ; good sensation and strength Request made for repeat arteriogram to evaluate for possible AVM   Past Medical History:  Diagnosis Date  . Colon polyps   . Depression    history of  . Osteoporosis   . Stroke [facility])     History reviewed. No pertinent surgical history.  Allergies: Other and Penicillins  Medications: Prior to Admission medications   Medication Sig Start Date End Date Taking? Authorizing Provider  ALPRAZolam (XANAX) 0.25 MG tablet Take 0.25 mg by mouth 2 (two) times daily.   Yes Historical Provider, MD  Calcium Carbonate-Vitamin D3 (CALCIUM 600-D) 600-400 MG-UNIT TABS Take 1 tablet by mouth 2 (two) times daily.   Yes Historical Provider, MD  cetirizine (ZYRTEC) 10 MG tablet Take 10 mg by mouth daily.    Yes Historical Provider, MD  EPIPEN 2-PAK 0.3 MG/0.3ML SOAJ injection INJECT 0.3 MLS (0.3 MG TOTAL) INTO THE MUSCLE ONCE. 06/29/15  Yes Susy Frizzle, MD  famotidine (PEPCID) 20 MG tablet Take 20 mg by mouth daily as needed for heartburn or  indigestion.   Yes Historical Provider, MD  ferrous sulfate 325 (65 FE) MG tablet Take 325 mg by mouth daily.    Yes Historical Provider, MD  losartan (COZAAR) 50 MG tablet Take 1 tablet (50 mg total) by mouth daily. 04/21/16  Yes Susy Frizzle, MD     Family History  Problem Relation Age of Onset  . Heart disease Mother   . Cancer Mother     lung  . Stroke Paternal Grandmother     Social History   Social History  . Marital status: Married    Spouse name: N/A  . Number of children: N/A  . Years of education: N/A   Social History Main Topics  . Smoking status: Never Smoker  . Smokeless tobacco: Never Used  . Alcohol use 0.6 oz/week    1 Glasses of wine per week     Comment: 1-2 beers/wine in evening  . Drug use: No  . Sexual activity: Not Asked     Comment: Married since 2005   Other Topics Concern  . None   Social History Narrative  . None     Review of Systems: A 12 point ROS discussed and pertinent positives are indicated in the HPI above.  All other systems are negative.  Review of Systems  Constitutional: Negative for activity change, appetite change, fatigue and fever.  HENT: Negative for tinnitus and trouble swallowing.   Eyes: Negative for visual disturbance.  Respiratory: Negative for cough and shortness of breath.   Cardiovascular: Negative for chest pain.  Gastrointestinal: Negative for abdominal pain.  Musculoskeletal: Negative for back pain and  gait problem.  Neurological: Negative for dizziness, tremors, seizures, syncope, facial asymmetry, speech difficulty, weakness, light-headedness, numbness and headaches.  Psychiatric/Behavioral: Negative for behavioral problems and confusion.    Vital Signs: BP 140/66   Pulse 80   Temp 97.5 F (36.4 C) (Oral)   Resp 20   Ht 5\' 6"  (1.676 m)   Wt 152 lb (68.9 kg)   SpO2 99%   BMI 24.53 kg/m   Physical Exam  Constitutional: She is oriented to person, place, and time.  Cardiovascular: Normal rate and  regular rhythm.   Pulmonary/Chest: Effort normal and breath sounds normal.  Abdominal: Soft. Bowel sounds are normal.  Musculoskeletal: Normal range of motion.  Neurological: She is alert and oriented to person, place, and time.  Skin: Skin is warm and dry.  Psychiatric: She has a normal mood and affect. Her behavior is normal. Judgment and thought content normal.  Nursing note and vitals reviewed.   Mallampati Score:  MD Evaluation Airway: WNL Heart: WNL Abdomen: WNL Chest/ Lungs: WNL ASA  Classification: 2 Mallampati/Airway Score: Two  Imaging: Mr Virgel Paling [facility] Contrast  Result Date: 07/08/2016  [facility] [address] [place], [place] [address] [phone] NEUROIMAGING REPORT STUDY DATE: 07/08/2016 PATIENT NAME: Cynthia [name] DOB: 12-03-1945 MRN: [id number] ORDERING CLINICIAN: Dr Leonie Man CLINICAL HISTORY:  68 year patient with ICH COMPARISON FILMS:  Cerebral catheter angio 03/22/2016 EXAM: MRA Brain wo TECHNIQUE: MR angiogram of the head was obtained utilizing 3D time of flight sequences from below the vertebrobasilar junction up to the intracranial vasculature without contrast.  Computerized reconstructions were obtained. CONTRAST: none IMAGING SITE: [facility] FINDINGS: Both internal carotid arteries demonstrate normal flow and caliber in the petrous, cavernous and cavernous supraclinoid segments. Both middle and intracerebral arteries appear normal. The left middle cerebral artery shows slight irregularity in the distal M1 and M2 segments but without flow-limiting stenosis. Both anterior cerebral arteries show normal flow and caliber without definite aneurysm or AVM noted. Both vertebral arteries contributing to the basilar artery though the right vertebral artery has slight irregular appearance and diminished caliber in its terminal portion. Basilar artery shows normal flow and caliber. There is persistent fetal pattern of origin of the right  vertebral artery. Both posterior cerebral arteries show normal flow and caliber. There are no definite visualized aneurysms noted though aneurysms smaller than 4 mm in size and not adequately visualized on MRA due to technical difficulties.    Unremarkable MRA of the brain showing no significant stenosis of the large and medium size intracranial vessels. Persistent fetal pattern of origin of the right posterior cerebral artery and hypoplastic terminal right vertebral artery are both benign congenital variants. INTERPRETING PHYSICIAN: Antony Contras, MD Certified in  Neuroimaging by [place] [employer] and [employer]   Mr Jeri Cos Wo Contrast  Result Date: 07/08/2016  [facility] [address] [place], [place] [address] [phone] NEUROIMAGING REPORT STUDY DATE: 07/08/2016 PATIENT NAME: Cynthia [name] DOB: 1945/10/30 MRN: [id number] ORDERING CLINICIAN: Dr Leonie Man CLINICAL HISTORY:  58 year patient with ICH follow up imaging COMPARISON FILMS: MRI Brain  03/20/2016 EXAM: MRI Brain w/wo TECHNIQUE: MRI of the brain with and without contrast was obtained utilizing 5 mm axial slices with T1, T2, T2 flair, T2 star gradient echo and diffusion weighted views.  T1 sagittal, T2 coronal and postcontrast views in the axial and coronal plane were obtained. CONTRAST:  14 ml iv multihance IMAGING SITE: [facility] FINDINGS: The  brain parenchyma shows area of encephalomalacia, gliosis and laminar necrosis in the right posterior frontal parasagittal region consistent with remote age hemorrhage. There are mild changes of chronic microvascular ischemia. The subarachnoid space is an ventricle system appear normal. Diffusion-weighted imaging is negative for active ischemia. Gradient echo images show area of remote hemorrhage stated above. The pituitary gland and cerebellar tonsils appear normal. Orbits appear unremarkable. Paranasal sinuses show mild  chronic inflammatory changes. Post contrast images show slight enhancement in the area of hemorrhage indicating late subacute to chronic nature. There does appear to be a prominent frontopolar vessel in the vicinity of the hemorrhage on the right but no definite AVM can be identified. Flow voids of the large vessels of the endocrine circulation appear to be patent.    Abnormal MRI scan of the brain showing late subacute to chronic right posterior frontal parasagittal hemorrhage with expected evolutionary findings. There are mild changes of chronic macrovascular ischemia. Overall expected evolutionary changes compared with previous MRI dated 03/20/2016. No underlying vascular lesion or mass is noted INTERPRETING PHYSICIAN: PRAMOD SETHI, MD Certified in  Neuroimaging by [place] [employer] and [employer]    Labs:  CBC:  Recent Labs  03/20/16 1243 03/20/16 1250 03/21/16 0554 03/22/16 0255 03/25/16 0528  WBC 7.8  --  6.9 8.8 9.2  HGB 13.0 13.3 11.8* 11.9* 12.1  HCT 38.7 39.0 35.1* 35.1* 34.4*  PLT 290  --  269 253 279    COAGS:  Recent Labs  03/20/16 1243  INR 1.03  APTT 32    BMP:  Recent Labs  03/22/16 0255 03/25/16 0528 03/26/16 0742 03/30/16 1319  NA 131* 139 139 133*  K 3.6 2.9* 3.4* 4.2  CL 100* 99* 103 100*  CO2 24 29 29 27   GLUCOSE 107* 101* 99 141*  BUN 7 <5* 9 12  CALCIUM 8.6* 9.1 9.2 9.1  CREATININE 0.63 0.61 0.64 0.68  GFRNONAA >60 >60 >60 >60  GFRAA >60 >60 >60 >60    LIVER FUNCTION TESTS:  Recent Labs  03/20/16 1243 03/25/16 0528  BILITOT 0.8 0.7  AST 26 45*  ALT 15 30  ALKPHOS 80 80  PROT 6.6 6.2*  ALBUMIN 4.0 3.4*    TUMOR MARKERS: No results for input(s): AFPTM, CEA, CA199, CHROMGRNA in the last 8760 hours.  Assessment and Plan:  03/2016 ICH Stable on follow up imaging Question raised on 7/17 arteriogram of possible ArterioVenous malformation Now scheduled for repeat cerebral  arteriogram for evaluation Risks and Benefits discussed with the patient including, but not limited to bleeding, infection, vascular injury, contrast induced renal failure, stroke or even death. All of the patient's questions were answered, patient is agreeable to proceed. Consent signed and in chart.   Thank you for this interesting consult.  I greatly enjoyed meeting Cynthia [name] and look forward to participating in their care.  A copy of this report was sent to the requesting provider on this date.  Electronically Signed: Shameka Aggarwal A 08/05/2016, 9:14 AM   I spent a total of  30 Minutes   in face to face in clinical consultation, greater than 50% of which was counseling/coordinating care for cerebral arteriogram

## 2016-08-05 NOTE — Procedures (Signed)
S/p bilateral common carotid and bilateral vertebral arteriograms. RT CFA approach. Findings. 1.No angio evidence of AV shunting,AVM,dissections or aneurysm noted . 2.Venous outflow WNLs

## 2016-08-05 NOTE — Sedation Documentation (Addendum)
Pressure hold complete- pulses same as before- dsg intact R groin

## 2016-08-05 NOTE — Sedation Documentation (Signed)
exoseal R groin, will hold pressure

## 2016-08-08 ENCOUNTER — Encounter (HOSPITAL_COMMUNITY): Payer: Self-pay | Admitting: Interventional Radiology

## 2016-08-14 ENCOUNTER — Other Ambulatory Visit: Payer: Self-pay | Admitting: Family Medicine

## 2016-09-02 LAB — HM DEXA SCAN

## 2016-09-06 ENCOUNTER — Other Ambulatory Visit: Payer: Self-pay | Admitting: Physician Assistant

## 2016-09-07 NOTE — Telephone Encounter (Signed)
Last OV 8-24 Ok to refill?

## 2016-09-07 NOTE — Telephone Encounter (Signed)
ok 

## 2016-09-08 NOTE — Telephone Encounter (Signed)
rx called into pharmacy

## 2016-09-15 ENCOUNTER — Encounter: Payer: Self-pay | Admitting: Family Medicine

## 2016-09-16 ENCOUNTER — Other Ambulatory Visit: Payer: Self-pay | Admitting: *Deleted

## 2016-09-16 MED ORDER — ALENDRONATE SODIUM 70 MG PO TABS: 70.0000 mg | ORAL_TABLET | ORAL | 11 refills | Status: DC

## 2016-09-22 ENCOUNTER — Ambulatory Visit (INDEPENDENT_AMBULATORY_CARE_PROVIDER_SITE_OTHER): Payer: Medicare Other | Admitting: Family Medicine

## 2016-09-22 ENCOUNTER — Encounter: Payer: Self-pay | Admitting: Family Medicine

## 2016-09-22 VITALS — BP 164/110 | HR 92 | Temp 98.7°F | Resp 16 | Ht 66.5 in | Wt 154.0 lb

## 2016-09-22 DIAGNOSIS — G459 Transient cerebral ischemic attack, unspecified: Secondary | ICD-10-CM

## 2016-09-22 DIAGNOSIS — I629 Nontraumatic intracranial hemorrhage, unspecified: Secondary | ICD-10-CM

## 2016-09-22 NOTE — Progress Notes (Signed)
Subjective:    Patient ID: Cynthia [name], female    DOB: [birth year], 71 y.o.   MRN: [id number]  HPI  04/14/16 Patient was admitted to the hospital July 9 with difficulty finding words and neglect of her left leg and some left-sided weakness. CT scan revealed a large right frontal intracranial hemorrhage. I have copied relevant portions of the discharge summary and included them below for my reference: Admit date: 03/20/2016 Discharge date: 03/24/2016  Admission Diagnoses: Headache and word finding difficulty  Discharge Diagnoses: Right frontal lobar intracerebral hemorrhage of indeterminate etiology. Mild left hemiparesis and speech difficulties Active Problems:   Intracerebral hemorrhage   ICH (intracerebral hemorrhage) (HCC)   Vascular headache   Benign essential HTN   Hyponatremia   Acute blood loss anemia   TBI (traumatic brain injury) ([place]) Mild cytotoxic edema  Discharged Condition: good  Hospital Course: 72 year old right-handed woman who presented to the [facility] department after being sent from her doctor's office due to concern for possible stroke.The patient  reported that the night of 03/19/2016 (LKW, time unknown) she developed a sense of feeling disoriented and having difficulty speaking. When asked about her speech, she described that she had trouble focusing her thoughts and getting her words out. She reported that her sister also noticed that she seemed slow to respond to questions. She felt as if her speech was somewhat slurred as well. She did not endorse any weakness, numbness, vision changes, difficulty swallowing, or balance impairments. However, she had noticed that she will find her left arm in unusual postures and positions without being aware that it is doing anything. When she looked at it, she was able to control it without a problem and again denied any weakness or sensory changes. She  also had a headache for the past 2 days.  She denies any  similar previous episodes. She denied any recent trauma to the head. She did note that she was working with her brother at her home and had been up on a ladder. She was getting off the ladder, she thought she was on the ground but instead was on the second step of the latter when she stumbled backwards. She says that she struck her bottom on a nearby chair, then fell to the floor landing on her bottom. She did not strike her head. She has not had any recent illness. She does not use daily antiplatelet therapy or anticoagulation. She uses occasional Advil for headache and pain and has taken a total of six 200 mg tablets since last night. She also reports that she took a baby aspirin earlier today as recommended by her sister because of her symptoms.CT brain on admission showed a large right frontal ICH. Patient was not administered IV t-PA secondary to [place]. She was admitted to the neuro ICU for further evaluation and treatment. Her blood pressure was tightly controlled initially with IV drip and subsequently with oral medications. Follow-up CT scan showed stable appearance of the hemorrhage. There was mild cytotoxic edema but no hydrocephalus or any hemorrhage expansion. CT angiogram and CT venogram did not show any aneurysms or venous sinus thrombosis but there are no engorged vessels in the region of the hemorrhage raising concern about a small AVM hence cerebral catheter angiogram was performed and findings of which are also indeterminate and showed abnormal right frontopolar branches .She had persistent left-sided weakness but her speech and word finding difficulties improved. She was transferred to the neurology floor in a condition remained stable.  She was seen by physical occupational therapist and felt to be a good candidate for inpatient rehabilitation. She was accepted for transfer to rehabilitation in stable condition.   Consults: rehabilitation medicine and ointerventional radiology  Significant  Diagnostic Studies:   CT R frontal lobe ICH, 20 mL, possible trace SDH  MRI R posterior frontal lobe ICH, 7-22 days old. No other blood products seen.  CTA head and neck No avm/aneurysm  CT venogram no sinus thrombosis  Cerebral angio No definite AVM or aneurysm noted but abnormal right frontopolar branches raises the question of possible small AVM  LDL 84  HgbA1c  5.3  She is here today for follow-up. She is doing remarkably well. She is walking without difficulty. She is in physical therapy. At the present time she is having no difficulty speaking. She denies any difficulty with short-term or long-term memory. She denies any personality changes. Her biggest concern is what caused this and how to prevent it in the future. She was still taking meloxicam. She is on no other agent that within her blood. Her blood pressure today was extremely elevated on intake. However after letting the patient sit comfortably for a few minutes, her blood pressure fell to 148/90.  AT that time, my plan was: At the time of her initial stroke, angiogram revealed no specific cause for the intracranial hemorrhage. Neurology has recommended repeating the test in 2 months. Patient is scheduled to follow with a neurologist in one month. In the meantime I want her to avoid any agents that can increase her risk of bleeding. I recommended that she stay away from meloxicam. She can use Tylenol if necessary for pain. I recommended avoiding I loosely aspirin or any other agent such as fish oil. Mother primary concern is her blood pressure. Even after calming down, her blood pressure is elevated today. She will check her blood pressure frequently over the weekend and call me with the values on Monday. Her goal blood pressures less than 130/80. If above this, I will start the patient on an angiotensin receptor blocker.  05/05/16 Patient is here today for follow-up. Ultimately started losartan 50 mg by mouth daily. Lead pressure at  home has been averaging between 115 and 130/70-80. She is tolerating the medication well with no side effects.  At that time, my plan was: Blood pressure is much better controlled. Continue losartan 50 mg by mouth daily. Recheck in 6 months   09/22/16 Patient presents with a very complicated problem. She has a history of intracranial hemorrhage. Follow-up MRI in November revealed no arteriovenous malformations or aneurysms. There were signs of chronic microvascular ischemia although there were mild. She has had numerous MRAs performed which revealed no significant extracranial or intracranial stenosis. Her cholesterol is outstanding. Her only risk factor would be elevated blood pressures. In August she had a transient episode where her left leg distal to her knee became numb. At the exact same time the left side of her face became numb. Symptoms lasted several minutes and resolve spontaneously. In the last 2 weeks she's had 2 separate incidents where her left arm from her shoulder to her hand became numb and weak. At the exact same time the left side of her face also became numb. This occurred only in one event. Other event although the arm was affected. In each of those events the symptoms last a proximally 10 minutes. There is no speech disturbance or facial asymmetry but symptoms are concerning for TIA. The unusual feature is  that there are no risk factors for TIA. Past Medical History:  Diagnosis Date  . Colon polyps   . Depression    history of  . Osteoporosis   . Stroke [facility])    Past Surgical History:  Procedure Laterality Date  . IR GENERIC HISTORICAL  08/05/2016   IR ANGIO VERTEBRAL SEL VERTEBRAL UNI R MOD SED 08/05/2016 Luanne Bras, MD [facility]INTER[facility]  . IR GENERIC HISTORICAL  08/05/2016   IR ANGIO INTRA EXTRACRAN SEL COM CAROTID INNOMINATE BILAT MOD SED 08/05/2016 Luanne Bras, MD [facility]  . IR GENERIC HISTORICAL  08/05/2016   IR ANGIO VERTEBRAL SEL SUBCLAVIAN INNOMINATE  UNI L MOD SED 08/05/2016 Luanne Bras, MD [facility]   Current Outpatient Prescriptions on File Prior to Visit  Medication Sig Dispense Refill  . alendronate (FOSAMAX) 70 MG tablet Take 1 tablet (70 mg total) by mouth every 7 (seven) days. Take with a full glass of water on an empty stomach. 4 tablet 11  . ALPRAZolam (XANAX) 0.25 MG tablet TAKE 1 TABLET BY MOUTH 3 TIMES A DAY AS NEEDED 90 tablet 0  . Calcium Carbonate-Vitamin D3 (CALCIUM 600-D) 600-400 MG-UNIT TABS Take 1 tablet by mouth 2 (two) times daily.    . cetirizine (ZYRTEC) 10 MG tablet Take 10 mg by mouth daily.     Marland Kitchen EPIPEN 2-PAK 0.3 MG/0.3ML SOAJ injection INJECT 0.3 MLS (0.3 MG TOTAL) INTO THE MUSCLE ONCE. 2 Device 0  . famotidine (PEPCID) 20 MG tablet Take 20 mg by mouth daily as needed for heartburn or indigestion.    . ferrous sulfate 325 (65 FE) MG tablet Take 325 mg by mouth daily.     Marland Kitchen losartan (COZAAR) 50 MG tablet TAKE 1 TABLET DAILY 30 tablet 11   No current facility-administered medications on file prior to visit.    Allergies  Allergen Reactions  . Other Anaphylaxis    FIRE ANTS Insect stings / bites  . Penicillins Rash    Has patient had a PCN reaction causing immediate rash, facial/tongue/throat swelling, SOB or lightheadedness with hypotension: Unknown Has patient had a PCN reaction causing severe rash involving mucus membranes or skin necrosis: No Has patient had a PCN reaction that required hospitalization No Has patient had a PCN reaction occurring within the last 10 years: No If all of the above answers are "NO", then may proceed with Cephalosporin use.    Social History   Social History  . Marital status: Married    Spouse name: N/A  . Number of children: N/A  . Years of education: N/A   Occupational History  . Not on file.   Social History Main Topics  . Smoking status: Never Smoker  . Smokeless tobacco: Never Used  . Alcohol use 0.6 oz/week    1 Glasses of wine per week      Comment: 1-2 beers/wine in evening  . Drug use: No  . Sexual activity: Not on file     Comment: Married since 2005   Other Topics Concern  . Not on file   Social History Narrative  . No narrative on file     Review of Systems  All other systems reviewed and are negative.      Objective:   Physical Exam  Constitutional: She is oriented to person, place, and time.  Cardiovascular: Normal rate, regular rhythm and normal heart sounds.   Pulmonary/Chest: Effort normal and breath sounds normal. No respiratory distress. She has no wheezes. She has no rales.  Abdominal: Soft. Bowel sounds are normal. She exhibits no distension. There is no tenderness. There is no rebound.  Neurological: She is alert and oriented to person, place, and time. She has normal reflexes. No cranial nerve deficit. She exhibits normal muscle tone. Coordination normal.  Psychiatric: She has a normal mood and affect. Her behavior is normal. Judgment and thought content normal.  Vitals reviewed.         Assessment & Plan:  Hemorrhage, intracranial ([place]) - Plan: Ambulatory referral to Neurology  Transient cerebral ischemia, unspecified type - Plan: Ambulatory referral to Neurology Patient has had a thorough diagnostic workup. At this point symptoms are consistent with a TIA. The decision is whether the risk of recurrent intracranial hemorrhage on antiplatelet therapy outweighs the risk of primary prevention of CVA secondary to TIA on antiplatelet therapy. After having a long discussion with the patient she agrees to start taking an aspirin 81 mg daily. His been more than 6 months since her stroke and therefore risk of extension of the previous hemorrhagic cva is not a consideration. I will increase her losartan 100 mg a day. Her home blood pressures are typically 1:30 to 140/80-90. I will also set her up to see her neurologist for a second opinion as to whether we should do to manage this

## 2016-09-23 ENCOUNTER — Telehealth: Payer: Self-pay | Admitting: Family Medicine

## 2016-09-23 NOTE — Telephone Encounter (Signed)
-----   Message from Susy Frizzle, MD sent at 09/23/2016  6:30 AM EST ----- He is notify the patient that I spoke with Dr. Leonie Man.  He would like to see her in clinic but he agreed with her starting an aspirin in the meantime

## 2016-09-23 NOTE — Telephone Encounter (Signed)
Pt called and notified of recommendations and will call to set up appt with Dr. Leonie Man

## 2016-09-26 ENCOUNTER — Telehealth: Payer: Self-pay | Admitting: Neurology

## 2016-09-26 NOTE — Telephone Encounter (Signed)
Dr.Sethi see note below , PCP wanted PCP to be seen asap. Pt is schedule with Carolyn(NP) on 09/28/2016 when Dr. Leonie Man is in the office.

## 2016-09-26 NOTE — Telephone Encounter (Signed)
Pt called said 09/16/16 she had a tight feeling in the left shoulder,down the arm to the tips of the fingers,back up to left side of neck to her cheek. She said 09/20/16 the left arm had a tight and numb feeling down to the fingertips. It was about 1 hour before the arm felt normal again.  Pt's PCP talked with Dr Leonie Man, said he wanted to see her in the clinic. Dr Leonie Man is booked out into 11/08/16. An appt was scheduled with Hoyle Sauer for 09/28/16. Advised RN would call if there were more questions.

## 2016-09-28 ENCOUNTER — Ambulatory Visit: Payer: Medicare Other | Admitting: Nurse Practitioner

## 2016-09-28 NOTE — Telephone Encounter (Signed)
Agree with plan as stated.  

## 2016-10-03 ENCOUNTER — Encounter: Payer: Self-pay | Admitting: Nurse Practitioner

## 2016-10-03 ENCOUNTER — Ambulatory Visit (INDEPENDENT_AMBULATORY_CARE_PROVIDER_SITE_OTHER): Payer: Medicare Other | Admitting: Nurse Practitioner

## 2016-10-03 VITALS — BP 122/64 | HR 82 | Resp 14 | Ht 66.5 in | Wt 156.0 lb

## 2016-10-03 DIAGNOSIS — R202 Paresthesia of skin: Secondary | ICD-10-CM | POA: Diagnosis not present

## 2016-10-03 DIAGNOSIS — I10 Essential (primary) hypertension: Secondary | ICD-10-CM

## 2016-10-03 DIAGNOSIS — I611 Nontraumatic intracerebral hemorrhage in hemisphere, cortical: Secondary | ICD-10-CM

## 2016-10-03 DIAGNOSIS — R200 Anesthesia of skin: Secondary | ICD-10-CM | POA: Insufficient documentation

## 2016-10-03 NOTE — Patient Instructions (Addendum)
Continue aspirin 81 daily . EEG to rule out seizure activity May think about a preventive medication if . Seizure activity ruled out for migraine Follow-up in 2 months

## 2016-10-03 NOTE — Progress Notes (Signed)
[facility]  PATIENT: Cynthia [name] DOB: July 26, 1946   REASON FOR VISIT: Follow-up for new complaints of numbness left side with history of intracerebral hemorrhage in July 2017 HISTORY FROM: Patient    HISTORY OF PRESENT ILLNESS:UPDATE 01/22/2018CM Cynthia [name], 71 year old female returns for follow-up with new complaints of numbness left side lasting 5-10 minutes. She has had 2 of these events in the last 2 months. She had hospital admission in July 2017 for intracranial hemorrhage. Follow-up angiogram in November revealed no AV malformation or aneurysms. Her blood pressure does fluctuate however is well controlled in the office today at 122/64. She has had episodes where her left arm from her shoulder to her hand became numb and tingling however she denied any weakness with this ,one time her face was involved there again numbness but no weakness. She denies any speech disturbance, swallowing problems visual difficulty, problems with balance or falls or other associated symptoms. She denies headache. Her aspirin 0.81 was restarted by her primary care physician on 09/22/2016. 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 71 year old right-handed woman who presented to the [facility] department after being sent from her doctor's office due to concern for possible stroke.The patient reported that the night of 03/19/2016 (LKW, time unknown) she developed a sense of feeling disoriented and having difficulty speaking. When asked about her speech, she described that she had trouble focusing her thoughts and getting her words out. She reported that her sister also noticed that she seemed slow to respond to questions. She felt as if her speech was somewhat slurred as well. She did not  endorse any weakness, numbness, vision changes, difficulty swallowing, or balance impairments. However, she had noticed that she will find her left arm in unusual postures and positions without being aware that it is doing anything. When she looked at it, she was able to control it without a problem and again denied any weakness or sensory changes. She also had a headache for the past 2 days. She denies any similar previous episodes. She denied any recent trauma to the head. She did note that she was working with her brother at her home and had been up on a ladder. She was getting off the ladder, she thought she was on the ground but instead was on the second step of the latter when she stumbled backwards. She says that she struck her bottom on a nearby chair, then fell to the floor landing on her bottom. She did not strike her head. She has not had any recent illness. She does not use daily antiplatelet therapy or anticoagulation. She uses occasional Advil for headache and pain and has taken a total of six 200 mg tablets since last night. She also reports that she took a baby aspirin earlier today as recommended by her sister because of her symptoms.CT brain on admission showed a large right frontal ICH. Patient was not administered IV t-PA secondary to [place]. She was admitted to the neuro ICU for further evaluation and treatment. Her blood pressure was tightly controlled initially with IV drip and subsequently with oral medications. Follow-up CT scan showed stable appearance of the hemorrhage. There was mild cytotoxic edema but no hydrocephalus or any hemorrhage expansion. CT angiogram and CT venogram did not show any aneurysms or venous sinus thrombosis  but there were engorged vessels in the region of the hemorrhage raising concern about a small AVM hence cerebral catheter angiogram was performed and findings of which are also indeterminate and showed abnormal right frontopolar branches .She had persistent  left-sided weakness but her speech and word finding difficulties improved. She was transferred to the neurology floor in a condition remained stable. She was seen by physical occupational therapist and felt to be a good candidate for inpatient rehabilitation. She was accepted for transfer to rehabilitation in stable condition. She states she's done well since discharge. She has patient outpatient physical and occupational therapy. She is back to her baseline and has no complaints or deficits from her intracerebral hemorrhage. She does feel she gets tired easily and at times her left-sided balance and coordination may be off. She states her blood pressure is well controlled and today it is 137/87 in office. She is wondering if she needs follow-up imaging studies. She has no complaints  REVIEW OF SYSTEMS: Full 14 system review of systems performed and notable only for those listed, all others are neg:  Constitutional: neg  Cardiovascular: neg Ear/Nose/Throat: neg  Skin: neg Eyes: neg Respiratory: neg Gastroitestinal: neg  Hematology/Lymphatic: neg  Endocrine: neg Musculoskeletal:neg Allergy/Immunology: neg Neurological: Numbness Psychiatric: Anxiety Sleep : neg   ALLERGIES: Allergies  Allergen Reactions  . Other Anaphylaxis    FIRE ANTS Insect stings / bites  . Penicillins Rash    Has patient had a PCN reaction causing immediate rash, facial/tongue/throat swelling, SOB or lightheadedness with hypotension: Unknown Has patient had a PCN reaction causing severe rash involving mucus membranes or skin necrosis: No Has patient had a PCN reaction that required hospitalization No Has patient had a PCN reaction occurring within the last 10 years: No If all of the above answers are "NO", then may proceed with Cephalosporin use.     HOME MEDICATIONS: Outpatient Medications Prior to Visit  Medication Sig Dispense Refill  . alendronate (FOSAMAX) 70 MG tablet Take 1 tablet (70 mg total) by mouth  every 7 (seven) days. Take with a full glass of water on an empty stomach. 4 tablet 11  . ALPRAZolam (XANAX) 0.25 MG tablet TAKE 1 TABLET BY MOUTH 3 TIMES A DAY AS NEEDED 90 tablet 0  . Calcium Carbonate-Vitamin D3 (CALCIUM 600-D) 600-400 MG-UNIT TABS Take 1 tablet by mouth 2 (two) times daily.    . cetirizine (ZYRTEC) 10 MG tablet Take 10 mg by mouth daily.     Marland Kitchen EPIPEN 2-PAK 0.3 MG/0.3ML SOAJ injection INJECT 0.3 MLS (0.3 MG TOTAL) INTO THE MUSCLE ONCE. 2 Device 0  . famotidine (PEPCID) 20 MG tablet Take 20 mg by mouth daily as needed for heartburn or indigestion.    . ferrous sulfate 325 (65 FE) MG tablet Take 325 mg by mouth daily.     Marland Kitchen losartan (COZAAR) 50 MG tablet TAKE 1 TABLET DAILY 30 tablet 11   No facility-administered medications prior to visit.     PAST MEDICAL HISTORY: Past Medical History:  Diagnosis Date  . Colon polyps   . Depression    history of  . Osteoporosis   . Stroke [facility])     PAST SURGICAL HISTORY: Past Surgical History:  Procedure Laterality Date  . IR GENERIC HISTORICAL  08/05/2016   IR ANGIO VERTEBRAL SEL VERTEBRAL UNI R MOD SED 08/05/2016 Luanne Bras, MD [facility]INTER[facility]  . IR GENERIC HISTORICAL  08/05/2016   IR ANGIO INTRA EXTRACRAN SEL COM CAROTID INNOMINATE BILAT MOD SED 08/05/2016  Luanne Bras, MD [facility]  . IR GENERIC HISTORICAL  08/05/2016   IR ANGIO VERTEBRAL SEL SUBCLAVIAN INNOMINATE UNI L MOD SED 08/05/2016 Luanne Bras, MD [facility]    FAMILY HISTORY: Family History  Problem Relation Age of Onset  . Heart disease Mother   . Cancer Mother     lung  . Stroke Paternal Grandmother     SOCIAL HISTORY: Social History   Social History  . Marital status: Married    Spouse name: N/A  . Number of children: N/A  . Years of education: N/A   Occupational History  . Not on file.   Social History Main Topics  . Smoking status: Never Smoker  . Smokeless tobacco: Never Used  . Alcohol use 0.6 oz/week    1 Glasses  of wine per week     Comment: 1-2 beers/wine in evening  . Drug use: No  . Sexual activity: Not on file     Comment: Married since 2005   Other Topics Concern  . Not on file   Social History Narrative  . No narrative on file     PHYSICAL EXAM  Vitals:   10/03/16 0802  BP: 122/64  Pulse: 82  Resp: 14  Weight: 156 lb (70.8 kg)  Height: 5' 6.5" (1.689 m)   Body mass index is 24.8 kg/m.  Generalized: Well developed, in no acute distress  Head: normocephalic and atraumatic,. Oropharynx benign  Neck: Supple, no carotid bruits  Cardiac: Regular rate rhythm, no murmur  Skin no rash or petechiae Musculoskeletal: No deformity   Neurological examination   Mentation: Alert oriented to time, place, history taking. Attention span and concentration appropriate. Recent and remote memory intact.  Follows all commands speech and language fluent.   Cranial nerve II-XII: Fundoscopic exam reveals sharp disc margins.Pupils were equal round reactive to light extraocular movements were full, visual field were full on confrontational test. Facial sensation and strength were normal. hearing was intact to finger rubbing bilaterally. Uvula tongue midline. head turning and shoulder shrug were normal and symmetric.Tongue protrusion into cheek strength was normal. Motor: normal bulk and tone, full strength in the BUE, BLE, fine finger movements normal, no pronator drift. No focal weakness Sensory: normal and symmetric to light touch, pinprick, and  Vibration, in the upper and lower extremities Coordination: finger-nose-finger, heel-to-shin bilaterally, no dysmetria Reflexes: 1+ upper lower and symmetric, plantar responses were flexor bilaterally. Gait and Station: Rising up from seated position without assistance, normal stance,  moderate stride, good arm swing, smooth turning, able to perform tiptoe, and heel walking without difficulty. Tandem gait is steady  DIAGNOSTIC DATA (LABS, IMAGING,  TESTING) - I reviewed patient records, labs, notes, testing and imaging myself where available.  Lab Results  Component Value Date   WBC 5.2 08/05/2016   HGB 12.7 08/05/2016   HCT 37.7 08/05/2016   MCV 89.5 08/05/2016   PLT 298 08/05/2016      Component Value Date/Time   NA 141 08/05/2016 0850   K 4.2 08/05/2016 0850   CL 105 08/05/2016 0850   CO2 26 08/05/2016 0850   GLUCOSE 90 08/05/2016 0850   BUN 17 08/05/2016 0850   CREATININE 0.80 08/05/2016 0850   CREATININE 0.69 06/19/2015 0833   CALCIUM 9.4 08/05/2016 0850   PROT 6.2 (L) 03/25/2016 0528   ALBUMIN 3.4 (L) 03/25/2016 0528   AST 45 (H) 03/25/2016 0528   ALT 30 03/25/2016 0528   ALKPHOS 80 03/25/2016 0528   BILITOT 0.7 03/25/2016  0528   GFRNONAA >60 08/05/2016 0850   GFRNONAA >89 06/19/2015 0833   GFRAA >60 08/05/2016 0850   GFRAA >89 06/19/2015 0833   Lab Results  Component Value Date   CHOL 181 03/21/2016   HDL 90 03/21/2016   LDLCALC 84 03/21/2016   TRIG 37 03/21/2016   CHOLHDL 2.0 03/21/2016   Lab Results  Component Value Date   HGBA1C 5.3 03/22/2016    Lab Results  Component Value Date   TSH 4.294 06/19/2015      ASSESSMENT AND PLAN 31 year Caucasian lady with parenchymal right frontal intracerebral hemorrhage in July 2017 of indeterminate etiology. She returns today with new complaints of left-sided numbness and tingling from the shoulder to the hand, no weakness was involved in 1 time left facial  but no drooping according to the patient.  Plan discussed with Dr. Erlinda Hong in Dr. Lenell Antu absence who wants to rule out seizure disorder but thinks this may be migraine related due to previous intracranial hemorrhage.  Continue aspirin 81 daily Cerebral angiogram ruled out aneurysm reviewed results with patient Continue to monitor blood pressure at home reading in the office today 122/64 . EEG to rule out seizure activity May think about a preventive medication if . Seizure activity ruled out such as  topamax  Follow-up in 2 months Greater than 50% of time during this 25 minute visit was spent on counseling,explanation of diagnosis, planning of further management, discussion with patient  and coordination of care Dennie Bible, [facility], [facility], APRN  [facility] [address], [place], [place] [address] [phone]

## 2016-10-06 ENCOUNTER — Other Ambulatory Visit: Payer: Self-pay | Admitting: *Deleted

## 2016-10-06 MED ORDER — LOSARTAN POTASSIUM 100 MG PO TABS: 100.0000 mg | ORAL_TABLET | Freq: Every day | ORAL | 3 refills | Status: DC

## 2016-10-06 NOTE — Telephone Encounter (Signed)
Received fax requesting refill on Losartan.   Refill appropriate and filled per protocol. 

## 2016-10-08 NOTE — Progress Notes (Signed)
I reviewed above note and agree with the assessment and plan.  Rosalin Hawking, MD PhD Stroke Neurology 10/08/2016 5:17 PM

## 2016-10-20 ENCOUNTER — Ambulatory Visit (INDEPENDENT_AMBULATORY_CARE_PROVIDER_SITE_OTHER): Payer: Medicare Other

## 2016-10-20 DIAGNOSIS — I611 Nontraumatic intracerebral hemorrhage in hemisphere, cortical: Secondary | ICD-10-CM

## 2016-10-20 DIAGNOSIS — R200 Anesthesia of skin: Secondary | ICD-10-CM

## 2016-10-20 DIAGNOSIS — R202 Paresthesia of skin: Principal | ICD-10-CM

## 2016-10-21 ENCOUNTER — Other Ambulatory Visit: Payer: Self-pay | Admitting: Family Medicine

## 2016-10-21 NOTE — Telephone Encounter (Signed)
Ok to refill 

## 2016-10-24 ENCOUNTER — Other Ambulatory Visit: Payer: Self-pay | Admitting: Neurology

## 2016-10-24 ENCOUNTER — Telehealth: Payer: Self-pay | Admitting: Nurse Practitioner

## 2016-10-24 DIAGNOSIS — R569 Unspecified convulsions: Secondary | ICD-10-CM

## 2016-10-24 MED ORDER — LEVETIRACETAM ER 500 MG PO TB24: 500.0000 mg | ORAL_TABLET | Freq: Every day | ORAL | 6 refills | Status: DC

## 2016-10-24 NOTE — Telephone Encounter (Signed)
Patient called in reference to see if EEG results are completed.  Patient states she did have a seizure yesterday. Please call

## 2016-10-24 NOTE — Telephone Encounter (Signed)
Spoke to pt.  She had episode yesterday of L shoulder to elbow numbness and tingling.  No other sx.  Will send message to provider to let him know.

## 2016-10-24 NOTE — Telephone Encounter (Signed)
ok 

## 2016-10-24 NOTE — Telephone Encounter (Signed)
I spoke to the patient and give results of EEG showing focal slowing on the right side which is to be expected given her previous intracerebral hemorrhage. She had another episode of transient paresthesias starting in the left shoulder and spreading gradually down into the fingertips of the left hand over   a few minutes. These episodes are suggestive of simple partial seizure. I recommend trial of Keppra XR 500 mg daily even though the EEG was negative for seizures. I discussed side effects with the patient and advised her to call me. She voiced understanding. She was advised to keep her upcoming appointment in March in the clinic

## 2016-12-06 ENCOUNTER — Encounter: Payer: Self-pay | Admitting: Nurse Practitioner

## 2016-12-06 ENCOUNTER — Ambulatory Visit (INDEPENDENT_AMBULATORY_CARE_PROVIDER_SITE_OTHER): Payer: Medicare Other | Admitting: Nurse Practitioner

## 2016-12-06 VITALS — BP 142/70 | HR 84 | Ht 66.5 in | Wt 161.6 lb

## 2016-12-06 DIAGNOSIS — I10 Essential (primary) hypertension: Secondary | ICD-10-CM

## 2016-12-06 DIAGNOSIS — G40909 Epilepsy, unspecified, not intractable, without status epilepticus: Secondary | ICD-10-CM

## 2016-12-06 DIAGNOSIS — I611 Nontraumatic intracerebral hemorrhage in hemisphere, cortical: Secondary | ICD-10-CM | POA: Diagnosis not present

## 2016-12-06 NOTE — Patient Instructions (Addendum)
Continue aspirin 81 daily Continue to monitor blood pressure at home reading in the office today 142/70 Keppra 500mg  daily Call for any seizure activity F/U in 6 months  Seizure, Adult A seizure is a sudden burst of abnormal electrical activity in the brain. The abnormal activity temporarily interrupts normal brain function, causing a person to experience any of the following:  Involuntary movements.  Changes in awareness or consciousness.  Uncontrollable shaking (convulsions). Seizures usually last from 30 seconds to 2 minutes. They usually do not cause permanent brain damage unless they are prolonged. What can cause a seizure to happen?  Seizures can happen for many reasons including:  A fever.  Low blood sugar.  A medicine.  An illnesses.  A brain injury. Some people who have a seizure never have another one. People who have repeated seizures have a condition called epilepsy. What are the symptoms of a seizure?  Symptoms of a seizure vary greatly from person to person. They include:  Convulsions.  Stiffening of the body.  Involuntary movements of the arms or legs.  Loss of consciousness.  Breathing problems.  Falling suddenly.  Confusion.  Head nodding.  Eye blinking or fluttering.  Lip smacking.  Drooling.  Rapid eye movements.  Grunting.  Loss of bladder control and bowel control.  Staring.  Unresponsiveness. Some people have symptoms right before a seizure happens (aura) and right after a seizure happens. Symptoms of an aura include:  Fear or anxiety.  Nausea.  Feeling like the room is spinning (vertigo).  A feeling of having seen or heard something before (deja vu).  Odd tastes or smells.  Changes in vision, such as seeing flashing lights or spots. Symptoms that may follow a seizure include:  Confusion.  Sleepiness.  Headache.  Weakness of one side of the body. Follow these instructions at home: Medicines    Take  over-the-counter and prescription medicines only as told by your health care provider.  Avoid any substances that may prevent your medicine from working properly, such as alcohol. Activity   Do not drive, swim, or do any other activities that would be dangerous if you had another seizure. Wait until your health care provider approves.  If you live in the [place] check with your local DMV (department of motor vehicles) to find out about the local driving laws. Each state has specific rules about when you can legally return to driving.  Get enough rest. Lack of sleep can make seizures more likely to occur. Educating others  Teach friends and family what to do if you have a seizure. They should:  Lay you on the ground to prevent a fall.  Cushion your head and body.  Loosen any tight clothing around your neck.  Turn you on your side. If vomiting occurs, this helps keep your airway clear.  Stay with you until you recover.  Not hold you down. Holding you down will not stop the seizure.  Not put anything in your mouth.  Know whether or not you need emergency care. General instructions   Contact your health care provider each time you have a seizure.  Avoid anything that has ever triggered a seizure for you.  Keep a seizure diary. Record what you remember about each seizure, especially anything that might have triggered the seizure.  Keep all follow-up visits as told by your health care provider. This is important. Contact a health care provider if:  You have another seizure.  You have seizures more often.  Your seizure  symptoms change.  You continue to have seizures with treatment.  You have symptoms of an infection or illness. They might increase your risk of having a seizure. Get help right away if:  You have a seizure:  That lasts longer than 5 minutes.  That is different than previous seizures.  That leaves you unable to speak or use a part of your body.  That  makes it harder to breathe.  After a head injury.  You have:  Multiple seizures in a row.  Confusion or a severe headache right after a seizure.  You are having seizures more often.  You do not wake up immediately after a seizure.  You injure yourself during a seizure. These symptoms may represent a serious problem that is an emergency. Do not wait to see if the symptoms will go away. Get medical help right away. Call your local emergency services (911 in the [place] Do not drive yourself to the hospital. This information is not intended to replace advice given to you by your health care provider. Make sure you discuss any questions you have with your health care provider. Document Released: 08/26/2000 Document Revised: 04/24/2016 Document Reviewed: 04/01/2016 Elsevier Interactive Patient Education  2017 [employer].

## 2016-12-06 NOTE — Progress Notes (Signed)
[facility]  PATIENT: Cynthia [name] DOB: [birth year]   REASON FOR VISIT: Follow-up for complaints of numbness left side thought to be  partial seizures with history of intracerebral hemorrhage in July 2017 HISTORY FROM: Patient    HISTORY OF PRESENT ILLNESS:UPDATE 03/27/2018CM Cynthia [name], 71 year old female returns for follow-up with a history of intracranial hemorrhage July 2017. When last seen she was having episodes of numbness left side lasting 5-10 minutes. EEG was ordered showing focal slowing on the right side which is to be expected given her previous intracerebral hemorrhage. She had another episode of transient paresthesias starting in the left shoulder and spreading gradually down into the fingertips of the left hand over   a few minutes. These episodes are suggestive of simple partial seizure. She was started on Keppra after her EEG was performed , she is currently taking Keppra XR 500 mg daily even though the EEG was negative for seizures. She has had one similar episode since it lasted several minutes shortly after starting the [place]. She remains on aspirin secondary stroke prevention. She denies any weakness speech disturbance swallowing problems visual difficulties and falls or balance issues. 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 She has had 2 of these events in the last 2 months. She had hospital admission in July 2017 for intracranial hemorrhage. Follow-up angiogram in November revealed no AV malformation or aneurysms. Her blood pressure does fluctuate however is well controlled in the office today at 122/64. She has had episodes where her left arm from her shoulder to her hand became numb and tingling however she denied any weakness with this ,one time her face was involved there  again numbness but no weakness. She denies any speech disturbance, swallowing problems visual difficulty, problems with balance or falls or other associated symptoms. She denies headache. Her aspirin 0.81 was restarted by her primary care physician on 09/22/2016. 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 71 year old right-handed woman who presented to the [facility] Emergency department after being sent from her doctor's office due to concern for possible stroke.The patient reported that the night of 03/19/2016 (LKW, time unknown) she developed a sense of feeling disoriented and having difficulty speaking. When asked about her speech, she described that she had trouble focusing her thoughts and getting her words out. She reported that her sister also noticed that she seemed slow to respond to questions. She felt as if her speech was somewhat slurred as well. She did not endorse any weakness, numbness, vision changes, difficulty swallowing, or balance impairments. However, she had noticed that she will find her left arm in unusual postures and positions without being aware that it is doing anything. When she looked at it, she was able to control it without a problem and again denied any weakness or sensory changes. She also had a headache for the past 2 days. She denies any similar previous episodes. She denied any recent trauma to the head. She did note that she was working with her brother at her home and had been up on a ladder. She was getting off the ladder, she thought she was on the ground but instead was on the second step  of the latter when she stumbled backwards. She says that she struck her bottom on a nearby chair, then fell to the floor landing on her bottom. She did not strike her head. She has not had any recent  illness. She does not use daily antiplatelet therapy or anticoagulation. She uses occasional Advil for headache and pain and has taken a total of six 200 mg tablets since last night. She also reports that she took a baby aspirin earlier today as recommended by her sister because of her symptoms.CT brain on admission showed a large right frontal ICH. Patient was not administered IV t-PA secondary to [place]. She was admitted to the neuro ICU for further evaluation and treatment. Her blood pressure was tightly controlled initially with IV drip and subsequently with oral medications. Follow-up CT scan showed stable appearance of the hemorrhage. There was mild cytotoxic edema but no hydrocephalus or any hemorrhage expansion. CT angiogram and CT venogram did not show any aneurysms or venous sinus thrombosis but there were engorged vessels in the region of the hemorrhage raising concern about a small AVM hence cerebral catheter angiogram was performed and findings of which are also indeterminate and showed abnormal right frontopolar branches .She had persistent left-sided weakness but her speech and word finding difficulties improved. She was transferred to the neurology floor in a condition remained stable. She was seen by physical occupational therapist and felt to be a good candidate for inpatient rehabilitation. She was accepted for transfer to rehabilitation in stable condition. She states she's done well since discharge. She has patient outpatient physical and occupational therapy. She is back to her baseline and has no complaints or deficits from her intracerebral hemorrhage. She does feel she gets tired easily and at times her left-sided balance and coordination may be off. She states her blood pressure is well controlled and today it is 137/87 in office. She is wondering if she needs follow-up imaging studies. She has no complaints  REVIEW OF SYSTEMS: Full 14 system review of systems performed and notable only for  those listed, all others are neg:  Constitutional: neg  Cardiovascular: neg Ear/Nose/Throat: neg  Skin: neg Eyes: neg Respiratory: neg Gastroitestinal: neg  Hematology/Lymphatic: neg  Endocrine: neg Musculoskeletal:neg Allergy/Immunology: neg Neurological: Numbness Psychiatric:neg Sleep : neg   ALLERGIES: Allergies  Allergen Reactions  . Other Anaphylaxis    FIRE ANTS Insect stings / bites  . Penicillins Rash    Has patient had a PCN reaction causing immediate rash, facial/tongue/throat swelling, SOB or lightheadedness with hypotension: Unknown Has patient had a PCN reaction causing severe rash involving mucus membranes or skin necrosis: No Has patient had a PCN reaction that required hospitalization No Has patient had a PCN reaction occurring within the last 10 years: No If all of the above answers are "NO", then may proceed with Cephalosporin use.     HOME MEDICATIONS: Outpatient Medications Prior to Visit  Medication Sig Dispense Refill  . alendronate (FOSAMAX) 70 MG tablet Take 1 tablet (70 mg total) by mouth every 7 (seven) days. Take with a full glass of water on an empty stomach. 4 tablet 11  . ALPRAZolam (XANAX) 0.25 MG tablet TAKE 1 TABLET BY MOUTH 3 TIMES A DAY AS NEEDED 90 tablet 0  . aspirin EC 81 MG tablet Take 81 mg by mouth daily.    . Calcium Carbonate-Vitamin D3 (CALCIUM 600-D) 600-400 MG-UNIT TABS Take 1 tablet by mouth 2 (two) times daily.    . cetirizine (ZYRTEC) 10 MG tablet  Take 10 mg by mouth daily.     Marland Kitchen EPIPEN 2-PAK 0.3 MG/0.3ML SOAJ injection INJECT 0.3 MLS (0.3 MG TOTAL) INTO THE MUSCLE ONCE. 2 Device 0  . famotidine (PEPCID) 20 MG tablet Take 20 mg by mouth daily as needed for heartburn or indigestion.    . ferrous sulfate 325 (65 FE) MG tablet Take 325 mg by mouth daily.     Marland Kitchen levETIRAcetam (KEPPRA XR) 500 MG 24 hr tablet Take 1 tablet (500 mg total) by mouth daily. 30 tablet 6  . losartan (COZAAR) 100 MG tablet Take 1 tablet (100 mg total) by  mouth daily. 90 tablet 3   No facility-administered medications prior to visit.     PAST MEDICAL HISTORY: Past Medical History:  Diagnosis Date  . Colon polyps   . Depression    history of  . Osteoporosis   . Stroke [facility])     PAST SURGICAL HISTORY: Past Surgical History:  Procedure Laterality Date  . IR GENERIC HISTORICAL  08/05/2016   IR ANGIO VERTEBRAL SEL VERTEBRAL UNI R MOD SED 08/05/2016 Luanne Bras, MD [facility]INTER[facility]  . IR GENERIC HISTORICAL  08/05/2016   IR ANGIO INTRA EXTRACRAN SEL COM CAROTID INNOMINATE BILAT MOD SED 08/05/2016 Luanne Bras, MD [facility]  . IR GENERIC HISTORICAL  08/05/2016   IR ANGIO VERTEBRAL SEL SUBCLAVIAN INNOMINATE UNI L MOD SED 08/05/2016 Luanne Bras, MD [facility]    FAMILY HISTORY: Family History  Problem Relation Age of Onset  . Heart disease Mother   . Cancer Mother     lung  . Stroke Paternal Grandmother     SOCIAL HISTORY: Social History   Social History  . Marital status: Married    Spouse name: N/A  . Number of children: N/A  . Years of education: N/A   Occupational History  . Not on file.   Social History Main Topics  . Smoking status: Never Smoker  . Smokeless tobacco: Never Used  . Alcohol use 0.6 oz/week    1 Glasses of wine per week     Comment: 1-2 beers/wine in evening  . Drug use: No  . Sexual activity: Not on file     Comment: Married since 2005   Other Topics Concern  . Not on file   Social History Narrative  . No narrative on file     PHYSICAL EXAM  Vitals:   12/06/16 1323  BP: (!) 142/70  Pulse: 84  Weight: 161 lb 9.6 oz (73.3 kg)  Height: 5' 6.5" (1.689 m)   Body mass index is 25.69 kg/m.  Generalized: Well developed, in no acute distress  Head: normocephalic and atraumatic,. Oropharynx benign  Neck: Supple, no carotid bruits  Cardiac: Regular rate rhythm, no murmur  Skin no rash or petechiae Musculoskeletal: No deformity   Neurological examination    Mentation: Alert oriented to time, place, history taking. Attention span and concentration appropriate. Recent and remote memory intact.  Follows all commands speech and language fluent.   Cranial nerve II-XII: Pupils were equal round reactive to light extraocular movements were full, visual field were full on confrontational test. Facial sensation and strength were normal. hearing was intact to finger rubbing bilaterally. Uvula tongue midline. head turning and shoulder shrug were normal and symmetric.Tongue protrusion into cheek strength was normal. Motor: normal bulk and tone, full strength in the BUE, BLE, fine finger movements normal, no pronator drift. No focal weakness Sensory: normal and symmetric to light touch, pinprick, and  Vibration, in  the upper and lower extremities Coordination: finger-nose-finger, heel-to-shin bilaterally, no dysmetria Reflexes: 1+ upper lower and symmetric, plantar responses were flexor bilaterally. Gait and Station: Rising up from seated position without assistance, normal stance,  moderate stride, good arm swing, smooth turning, able to perform tiptoe, and heel walking without difficulty. Tandem gait is steady  DIAGNOSTIC DATA (LABS, IMAGING, TESTING) - I reviewed patient records, labs, notes, testing and imaging myself where available.  Lab Results  Component Value Date   WBC 5.2 08/05/2016   HGB 12.7 08/05/2016   HCT 37.7 08/05/2016   MCV 89.5 08/05/2016   PLT 298 08/05/2016      Component Value Date/Time   NA 141 08/05/2016 0850   K 4.2 08/05/2016 0850   CL 105 08/05/2016 0850   CO2 26 08/05/2016 0850   GLUCOSE 90 08/05/2016 0850   BUN 17 08/05/2016 0850   CREATININE 0.80 08/05/2016 0850   CREATININE 0.69 06/19/2015 0833   CALCIUM 9.4 08/05/2016 0850   PROT 6.2 (L) 03/25/2016 0528   ALBUMIN 3.4 (L) 03/25/2016 0528   AST 45 (H) 03/25/2016 0528   ALT 30 03/25/2016 0528   ALKPHOS 80 03/25/2016 0528   BILITOT 0.7 03/25/2016 0528   GFRNONAA  >60 08/05/2016 0850   GFRNONAA >89 06/19/2015 0833   GFRAA >60 08/05/2016 0850   GFRAA >89 06/19/2015 0833   Lab Results  Component Value Date   CHOL 181 03/21/2016   HDL 90 03/21/2016   LDLCALC 84 03/21/2016   TRIG 37 03/21/2016   CHOLHDL 2.0 03/21/2016   Lab Results  Component Value Date   HGBA1C 5.3 03/22/2016      ASSESSMENT AND PLAN 34 year Caucasian lady with parenchymal right frontal intracerebral hemorrhage in July 2017 of indeterminate etiology. When last seen she had  complaints of left-sided numbness and tingling from the shoulder to the hand, no weakness was involved thought to be partial seizures EEG was ordered showing focal slowing on the right side which is to be expected given her previous intracerebral hemorrhage. She was started on Keppra XR 500 daily.  Continue aspirin 81 daily Continue to monitor blood pressure at home reading in the office today 142/70 Keppra XR 500mg  daily does not need refills Call for seizure activity F/U in 6 months Greater than 50% of time during this 25 minute visit was spent on counseling,explanation of diagnosis, planning of further management, discussion with patient  and coordination of care for seizure disorder Dennie Bible, [facility], [facility], [facility] [address] [place], [place] [address] [phone]

## 2016-12-09 NOTE — Progress Notes (Signed)
I agree with the above plan 

## 2016-12-11 ENCOUNTER — Other Ambulatory Visit: Payer: Self-pay | Admitting: Family Medicine

## 2016-12-12 NOTE — Telephone Encounter (Signed)
ok 

## 2016-12-12 NOTE — Telephone Encounter (Signed)
Ok to refill 

## 2016-12-23 ENCOUNTER — Other Ambulatory Visit: Payer: Self-pay | Admitting: Family Medicine

## 2016-12-28 ENCOUNTER — Telehealth: Payer: Self-pay | Admitting: Family Medicine

## 2016-12-28 NOTE — Telephone Encounter (Signed)
Patient calling to say that she called her pharmacy about her epipen and they said it needed prior authorization she was checking on the status of this  [facility]

## 2016-12-29 NOTE — Telephone Encounter (Signed)
Called and spoke to pharmacy and med does not need PA but they did have to order it and will have it ready tomorrow for her. Called and left detailed message on vm making pt aware of this information.

## 2017-01-21 ENCOUNTER — Other Ambulatory Visit: Payer: Self-pay | Admitting: Family Medicine

## 2017-01-23 NOTE — Telephone Encounter (Signed)
ok 

## 2017-01-23 NOTE — Telephone Encounter (Signed)
Medication called to pharmacy. 

## 2017-01-23 NOTE — Telephone Encounter (Signed)
Ok to refill xanax 

## 2017-02-21 ENCOUNTER — Encounter: Payer: Self-pay | Admitting: Family Medicine

## 2017-02-21 ENCOUNTER — Ambulatory Visit (INDEPENDENT_AMBULATORY_CARE_PROVIDER_SITE_OTHER): Payer: Medicare Other | Admitting: Family Medicine

## 2017-02-21 VITALS — BP 140/72 | HR 68 | Temp 97.9°F | Resp 16 | Ht 66.5 in | Wt 156.0 lb

## 2017-02-21 DIAGNOSIS — J0100 Acute maxillary sinusitis, unspecified: Secondary | ICD-10-CM

## 2017-02-21 MED ORDER — LEVOFLOXACIN 500 MG PO TABS: 500.0000 mg | ORAL_TABLET | Freq: Every day | ORAL | 0 refills | Status: DC

## 2017-02-21 NOTE — Patient Instructions (Signed)
Start antibiotics Use nasal saline or flonase F/U as needed

## 2017-02-21 NOTE — Progress Notes (Signed)
   Subjective:    Patient ID: Cynthia [name], female    DOB: [birth year], 71 y.o.   MRN: [id number]  Patient presents for Illness (x2 weeks- sinus pressure, nasal drainage, HA, ear pressure, tooth pressure/ pain, fever)   Two weeks of sinus pressure ,drainage, feels pain into gumline. Has underlying allergies has been taking anti-histamine. Had fever yesterday 99.62f Minimal cough, more from drainage     Review Of Systems:  GEN- denies fatigue,+ fever, weight loss,weakness, recent illness HEENT- denies eye drainage, change in vision, +nasal discharge, CVS- denies chest pain, palpitations RESP- denies SOB, +cough, wheeze ABD- denies N/V, change in stools, abd pain GU- denies dysuria, hematuria, dribbling, incontinence MSK- denies joint pain, muscle aches, injury Neuro- denies headache, dizziness, syncope, seizure activity       Objective:    BP 140/72   Pulse 68   Temp 97.9 F (36.6 C) (Oral)   Resp 16   Ht 5' 6.5" (1.689 m)   Wt 156 lb (70.8 kg)   SpO2 99%   BMI 24.80 kg/m  GEN- NAD, alert and oriented x3 HEENT- PERRL, EOMI, non injected sclera, pink conjunctiva, MMM, oropharynxclear, TM clear bilat no effusion,  + maxillary/ethmoid sinus tenderness, inflammed turbinates,  Nasal drainage  Neck- Supple, shotty LAD CVS- RRR, no murmur RESP-CTAB EXT- No edema Pulses- Radial 2+         Assessment & Plan:      Problem List Items Addressed This Visit    None    Visit Diagnoses    Acute non-recurrent maxillary sinusitis    -  Primary   treat with levaquin x 7 days, flonase, continue anti-histmine, tylenol for fever ,cough from post nasal drip   Relevant Medications   loratadine (CLARITIN) 10 MG tablet   levofloxacin (LEVAQUIN) 500 MG tablet      Note: This dictation was prepared with Dragon dictation along with smaller phrase technology. Any transcriptional errors that result from this process are unintentional.

## 2017-03-15 ENCOUNTER — Other Ambulatory Visit: Payer: Self-pay | Admitting: Family Medicine

## 2017-03-16 NOTE — Telephone Encounter (Signed)
Ok to refill 

## 2017-03-16 NOTE — Telephone Encounter (Signed)
ok 

## 2017-04-27 ENCOUNTER — Other Ambulatory Visit: Payer: Self-pay | Admitting: Family Medicine

## 2017-04-27 NOTE — Telephone Encounter (Signed)
sure

## 2017-04-27 NOTE — Telephone Encounter (Signed)
Ok to refill 

## 2017-05-11 ENCOUNTER — Ambulatory Visit (INDEPENDENT_AMBULATORY_CARE_PROVIDER_SITE_OTHER): Payer: Medicare Other | Admitting: Physician Assistant

## 2017-05-11 ENCOUNTER — Encounter: Payer: Self-pay | Admitting: Physician Assistant

## 2017-05-11 VITALS — BP 124/76 | HR 69 | Temp 97.7°F | Resp 16 | Ht 66.5 in | Wt 158.2 lb

## 2017-05-11 DIAGNOSIS — R8290 Unspecified abnormal findings in urine: Secondary | ICD-10-CM

## 2017-05-11 DIAGNOSIS — N390 Urinary tract infection, site not specified: Secondary | ICD-10-CM

## 2017-05-11 LAB — URINALYSIS, ROUTINE W REFLEX MICROSCOPIC
BILIRUBIN URINE: NEGATIVE
Glucose, UA: NEGATIVE
KETONES UR: NEGATIVE
Nitrite: NEGATIVE
Protein, ur: NEGATIVE
Specific Gravity, Urine: 1.015 (ref 1.001–1.035)
pH: 7.5 (ref 5.0–8.0)

## 2017-05-11 LAB — URINALYSIS, MICROSCOPIC ONLY
Casts: NONE SEEN [LPF]
Crystals: NONE SEEN [HPF]
YEAST: NONE SEEN [HPF]

## 2017-05-11 MED ORDER — CIPROFLOXACIN HCL 500 MG PO TABS: 500.0000 mg | ORAL_TABLET | Freq: Two times a day (BID) | ORAL | 0 refills | Status: DC

## 2017-05-11 NOTE — Progress Notes (Signed)
Patient ID: Demari Kropp MRN: [id number], DOB: October 17, 1945, 71 y.o. Date of Encounter: 05/11/2017, 12:16 PM    Chief Complaint:  Chief Complaint  Patient presents with  . Dysuria    x1week   . urine odor     HPI: 71 y.o. year old female presents with above.   She reports that she has been noticing some urinary frequency. She will let just urinated and then just a few seconds later will feel like she needs to go again but then nothing comes out. Also is having some burning when she urinates. Also is noticing some odor to (and her urine looks cloudy. Says that she has been noticing all of this develop gradually and has gradually worsened over the last couple of days. She states that she has low back pain secondary to arthritis chronically but has had no other back pain at the area of the kidneys. She's had no fevers or chills.     Home Meds:   Outpatient Medications Prior to Visit  Medication Sig Dispense Refill  . alendronate (FOSAMAX) 70 MG tablet Take 1 tablet (70 mg total) by mouth every 7 (seven) days. Take with a full glass of water on an empty stomach. 4 tablet 11  . ALPRAZolam (XANAX) 0.25 MG tablet TAKE 1 TABLET BY MOUTH 3 TIMES A DAY AS NEEDED FOR ANXIETY 90 tablet 0  . aspirin EC 81 MG tablet Take 81 mg by mouth daily.    . Calcium Carbonate-Vitamin D3 (CALCIUM 600-D) 600-400 MG-UNIT TABS Take 1 tablet by mouth 2 (two) times daily.    . cetirizine (ZYRTEC) 10 MG tablet Take 10 mg by mouth daily.     Marland Kitchen EPIPEN 2-PAK 0.3 MG/0.3ML SOAJ injection INJECT 0.3 MLS (0.3 MG TOTAL) INTO THE MUSCLE ONCE. 2 Device 0  . famotidine (PEPCID) 20 MG tablet Take 20 mg by mouth daily as needed for heartburn or indigestion.    . ferrous sulfate 325 (65 FE) MG tablet Take 325 mg by mouth daily.     Marland Kitchen levETIRAcetam (KEPPRA XR) 500 MG 24 hr tablet Take 1 tablet (500 mg total) by mouth daily. 30 tablet 6  . loratadine (CLARITIN) 10 MG tablet Take 10 mg by mouth daily.    Marland Kitchen losartan (COZAAR)  100 MG tablet Take 1 tablet (100 mg total) by mouth daily. 90 tablet 3  . levofloxacin (LEVAQUIN) 500 MG tablet Take 1 tablet (500 mg total) by mouth daily. 7 tablet 0   No facility-administered medications prior to visit.     Allergies:  Allergies  Allergen Reactions  . Other Anaphylaxis    FIRE ANTS Insect stings / bites  . Penicillins Rash    Has patient had a PCN reaction causing immediate rash, facial/tongue/throat swelling, SOB or lightheadedness with hypotension: Unknown Has patient had a PCN reaction causing severe rash involving mucus membranes or skin necrosis: No Has patient had a PCN reaction that required hospitalization No Has patient had a PCN reaction occurring within the last 10 years: No If all of the above answers are "NO", then may proceed with Cephalosporin use.       Review of Systems: See HPI for pertinent ROS. All other ROS negative.    Physical Exam: Blood pressure 124/76, pulse 69, temperature 97.7 F (36.5 C), temperature source Oral, resp. rate 16, height 5' 6.5" (1.689 m), weight 158 lb 3.2 oz (71.8 kg), SpO2 98 %., Body mass index is 25.15 kg/m. General:  WNWD WF. Appears in no  acute distress. Neck: Supple. No thyromegaly. No lymphadenopathy. Lungs: Clear bilaterally to auscultation without wheezes, rales, or rhonchi. Breathing is unlabored. Heart: Regular rhythm. No murmurs, rubs, or gallops. Abdomen: Soft,  non-distended with normoactive bowel sounds. No hepatomegaly. No rebound/guarding. No obvious abdominal masses. There is very minimal tenderness with palpation just at the area of the midline suprapubic region. No other areas of tenderness with palpation. Msk:  Strength and tone normal for age. There is no tenderness with percussion to costophrenic angles bilaterally. Extremities/Skin: Warm and dry.  Neuro: Alert and oriented X 3. Moves all extremities spontaneously. Gait is normal. CNII-XII grossly in tact. Psych:  Responds to questions  appropriately with a normal affect.   Results for orders placed or performed in visit on 05/11/17  Urinalysis, Routine w reflex microscopic  Result Value Ref Range   Color, Urine YELLOW YELLOW   APPearance CLOUDY (A) CLEAR   Specific Gravity, Urine 1.015 1.001 - 1.035   pH 7.5 5.0 - 8.0   Glucose, UA NEGATIVE NEGATIVE   Bilirubin Urine NEGATIVE NEGATIVE   Ketones, ur NEGATIVE NEGATIVE   Hgb urine dipstick TRACE (A) NEGATIVE   Protein, ur NEGATIVE NEGATIVE   Nitrite NEGATIVE NEGATIVE   Leukocytes, UA 3+ (A) NEGATIVE  Urine Microscopic  Result Value Ref Range   WBC, UA 20-40 (A) <=5 WBC/HPF   RBC / HPF 3-10 (A) <=2 RBC/HPF   Squamous Epithelial / LPF 0-5 <=5 HPF   Bacteria, UA FEW (A) NONE SEEN HPF   Crystals NONE SEEN NONE SEEN HPF   Casts NONE SEEN NONE SEEN LPF   Yeast NONE SEEN NONE SEEN HPF     ASSESSMENT AND PLAN:  71 y.o. year old female with  1. Abnormal urine odor - Urinalysis, Routine w reflex microscopic  2. Urinary tract infection without hematuria, site unspecified She is to start the Cipro and take first dose as soon as possible. Get in both doses twice a day today. She will then take as directed and complete all of it. Follow-up if symptoms worsen or do not resolve upon completion of antibiotic. - ciprofloxacin (CIPRO) 500 MG tablet; Take 1 tablet (500 mg total) by mouth 2 (two) times daily.  Dispense: 14 tablet; Refill: 0 - Urine Culture   Signed, [address] [place], [place], [facility] 05/11/2017 12:16 PM

## 2017-05-14 LAB — URINE CULTURE

## 2017-05-16 ENCOUNTER — Telehealth: Payer: Self-pay | Admitting: Physician Assistant

## 2017-05-16 ENCOUNTER — Telehealth: Payer: Self-pay | Admitting: Family Medicine

## 2017-05-16 NOTE — Telephone Encounter (Signed)
Error

## 2017-05-16 NOTE — Addendum Note (Signed)
Addended by: Shary Decamp B on: 05/16/2017 04:22 PM   Modules accepted: Level of Service, SmartSet

## 2017-05-16 NOTE — Telephone Encounter (Signed)
This encounter was created in error - please disregard.

## 2017-05-16 NOTE — Telephone Encounter (Signed)
Pt calling about possible reaction to cipro. States she has a rash on the back of her neck and slightly on her back, wants to know if she needs to discontinue use of antibiotic or if we need to switch it. She is not itching or anything and doesn't mind continuing to use it even though it is causing a rash. If we call in something different call it in to [facility].

## 2017-05-17 ENCOUNTER — Other Ambulatory Visit: Payer: Self-pay | Admitting: Neurology

## 2017-05-17 DIAGNOSIS — R569 Unspecified convulsions: Secondary | ICD-10-CM

## 2017-05-17 NOTE — Telephone Encounter (Signed)
Patient aware of providers recommendations and states that the rash is not spreading and in fact is fading. She will continue Cipro until completed.

## 2017-05-17 NOTE — Telephone Encounter (Signed)
Has the rash spread or is it only on her neck?  If the rash has not spread, then would continue the Cipro and call if rash worsens.

## 2017-06-07 NOTE — Progress Notes (Signed)
[facility]  PATIENT: Cynthia [name] DOB: [birth year]   REASON FOR VISIT: Follow-up for complaints of numbness left side thought to be  partial seizures with history of intracerebral hemorrhage in July 2017 HISTORY FROM: Patient    HISTORY OF PRESENT ILLNESS:UPDATE 09/27/2018CM Cynthia [name], 71 year old female returns for follow-up with a history of intracranial hemorrhage in July 2017. She also has a history of episodes suggestive of simple partial seizures and was started on Keppra 500 mg daily. She has not had further episodes.She denies side effects to the medication. She remains on aspirin for secondary stroke prevention without recurrent stroke or TIA symptoms. She has minimal bruising and no bleeding.Blood pressure in the office today 129/71. She continues to teach part time. She denies any speech disturbance swallowing problems, visual difficulties, weakness, balance issues or falls. She returns for reevaluation  UPDATE 03/27/2018CM Cynthia [name], 71 year old female returns for follow-up with a history of intracranial hemorrhage July 2017. When last seen she was having episodes of numbness left side lasting 5-10 minutes. EEG was ordered showing focal slowing on the right side which is to be expected given her previous intracerebral hemorrhage. She had another episode of transient paresthesias starting in the left shoulder and spreading gradually down into the fingertips of the left hand over   a few minutes. These episodes are suggestive of simple partial seizure. She was started on Keppra after her EEG was performed , she is currently taking Keppra XR 500 mg daily even though the EEG was negative for seizures. She has had one similar episode since it lasted several minutes shortly after starting the [place]. She remains on aspirin secondary stroke prevention. She denies any weakness speech disturbance swallowing problems visual difficulties and falls or balance issues. 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 She has had 2 of these events in the last 2 months. She had hospital admission in July 2017 for intracranial hemorrhage. Follow-up angiogram in November revealed no AV malformation or aneurysms. Her blood pressure does fluctuate however is well controlled in the office today at 122/64. She has had episodes where her left arm from her shoulder to her hand became numb and tingling however she denied any weakness with this ,one time her face was involved there again numbness but no weakness. She denies any speech disturbance, swallowing problems visual difficulty, problems with balance or falls or other associated symptoms. She denies headache. Her aspirin 0.81 was restarted by her primary care physician on 09/22/2016. 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 71 year old right-handed woman who presented to the [facility] Emergency department after being sent from her doctor's office due to concern for possible stroke.The patient reported that the night of 03/19/2016 (LKW, time unknown) she developed a sense of feeling disoriented and having difficulty speaking. When asked about her speech, she described that she had trouble focusing her thoughts and getting her words out. She reported that her sister also noticed that she seemed slow to respond to questions. She felt as if her speech was somewhat slurred as well. She did not endorse any weakness, numbness, vision changes, difficulty swallowing, or balance impairments. However, she had noticed that she will find  her left arm in unusual postures and positions without being  aware that it is doing anything. When she looked at it, she was able to control it without a problem and again denied any weakness or sensory changes. She also had a headache for the past 2 days. She denies any similar previous episodes. She denied any recent trauma to the head. She did note that she was working with her brother at her home and had been up on a ladder. She was getting off the ladder, she thought she was on the ground but instead was on the second step of the latter when she stumbled backwards. She says that she struck her bottom on a nearby chair, then fell to the floor landing on her bottom. She did not strike her head. She has not had any recent illness. She does not use daily antiplatelet therapy or anticoagulation. She uses occasional Advil for headache and pain and has taken a total of six 200 mg tablets since last night. She also reports that she took a baby aspirin earlier today as recommended by her sister because of her symptoms.CT brain on admission showed a large right frontal ICH. Patient was not administered IV t-PA secondary to Kim. She was admitted to the neuro ICU for further evaluation and treatment. Her blood pressure was tightly controlled initially with IV drip and subsequently with oral medications. Follow-up CT scan showed stable appearance of the hemorrhage. There was mild cytotoxic edema but no hydrocephalus or any hemorrhage expansion. CT angiogram and CT venogram did not show any aneurysms or venous sinus thrombosis but there were engorged vessels in the region of the hemorrhage raising concern about a small AVM hence cerebral catheter angiogram was performed and findings of which are also indeterminate and showed abnormal right frontopolar branches .She had persistent left-sided weakness but her speech and word finding difficulties improved. She was transferred to the neurology floor in a condition remained stable. She was seen by physical occupational therapist and  felt to be a good candidate for inpatient rehabilitation. She was accepted for transfer to rehabilitation in stable condition. She states she's done well since discharge. She has patient outpatient physical and occupational therapy. She is back to her baseline and has no complaints or deficits from her intracerebral hemorrhage. She does feel she gets tired easily and at times her left-sided balance and coordination may be off. She states her blood pressure is well controlled and today it is 137/87 in office. She is wondering if she needs follow-up imaging studies. She has no complaints  REVIEW OF SYSTEMS: Full 14 system review of systems performed and notable only for those listed, all others are neg:  Constitutional: neg  Cardiovascular: neg Ear/Nose/Throat: neg  Skin: neg Eyes: neg Respiratory: neg Gastroitestinal: neg  Hematology/Lymphatic: neg  Endocrine: neg Musculoskeletal:neg Allergy/Immunology: neg Neurological: neg Psychiatric:neg Sleep : neg   ALLERGIES: Allergies  Allergen Reactions  . Other Anaphylaxis    FIRE ANTS Insect stings / bites  . Penicillins Rash    Has patient had a PCN reaction causing immediate rash, facial/tongue/throat swelling, SOB or lightheadedness with hypotension: Unknown Has patient had a PCN reaction causing severe rash involving mucus membranes or skin necrosis: No Has patient had a PCN reaction that required hospitalization No Has patient had a PCN reaction occurring within the last 10 years: No If all of the above answers are "NO", then may proceed with Cephalosporin use.     HOME MEDICATIONS: Outpatient Medications Prior to Visit  Medication Sig Dispense Refill  . alendronate (FOSAMAX) 70 MG tablet Take 1 tablet (70 mg total) by mouth every 7 (seven) days. Take with a full glass of water on an empty stomach. 4 tablet 11  . ALPRAZolam (XANAX) 0.25 MG tablet TAKE 1 TABLET BY MOUTH 3 TIMES A DAY AS NEEDED FOR ANXIETY 90 tablet 0  . aspirin EC  81 MG tablet Take 81 mg by mouth daily.    . Calcium Carbonate-Vitamin D3 (CALCIUM 600-D) 600-400 MG-UNIT TABS Take 1 tablet by mouth 2 (two) times daily.    . cetirizine (ZYRTEC) 10 MG tablet Take 10 mg by mouth daily.     Marland Kitchen EPIPEN 2-PAK 0.3 MG/0.3ML SOAJ injection INJECT 0.3 MLS (0.3 MG TOTAL) INTO THE MUSCLE ONCE. 2 Device 0  . famotidine (PEPCID) 20 MG tablet Take 20 mg by mouth daily as needed for heartburn or indigestion.    . ferrous sulfate 325 (65 FE) MG tablet Take 325 mg by mouth daily.     Marland Kitchen levETIRAcetam (KEPPRA XR) 500 MG 24 hr tablet TAKE 1 TABLET (500 MG TOTAL) BY MOUTH DAILY. 90 tablet 3  . loratadine (CLARITIN) 10 MG tablet Take 10 mg by mouth daily.    Marland Kitchen losartan (COZAAR) 100 MG tablet Take 1 tablet (100 mg total) by mouth daily. 90 tablet 3  . ciprofloxacin (CIPRO) 500 MG tablet Take 1 tablet (500 mg total) by mouth 2 (two) times daily. 14 tablet 0   No facility-administered medications prior to visit.     PAST MEDICAL HISTORY: Past Medical History:  Diagnosis Date  . Colon polyps   . Depression    history of  . Osteoporosis   . Stroke [facility])     PAST SURGICAL HISTORY: Past Surgical History:  Procedure Laterality Date  . IR GENERIC HISTORICAL  08/05/2016   IR ANGIO VERTEBRAL SEL VERTEBRAL UNI R MOD SED 08/05/2016 Luanne Bras, MD [facility]  . IR GENERIC HISTORICAL  08/05/2016   IR ANGIO INTRA EXTRACRAN SEL COM CAROTID INNOMINATE BILAT MOD SED 08/05/2016 Luanne Bras, MD [facility]  . IR GENERIC HISTORICAL  08/05/2016   IR ANGIO VERTEBRAL SEL SUBCLAVIAN INNOMINATE UNI L MOD SED 08/05/2016 Luanne Bras, MD [facility]    FAMILY HISTORY: Family History  Problem Relation Age of Onset  . Heart disease Mother   . Cancer Mother        lung  . Stroke Paternal Grandmother     SOCIAL HISTORY: Social History   Social History  . Marital status: Married    Spouse name: N/A  . Number of children: 3  . Years of education: N/A    Occupational History  .      teacher, retired   Social History Main Topics  . Smoking status: Never Smoker  . Smokeless tobacco: Never Used  . Alcohol use 0.6 oz/week    1 Glasses of wine per week     Comment: 1-2 beers/wine in evening  . Drug use: No  . Sexual activity: Not on file     Comment: Married since 2005   Other Topics Concern  . Not on file   Social History Narrative  . No narrative on file     PHYSICAL EXAM  Vitals:   06/08/17 1028  BP: 129/71  Pulse: 74  Weight: 158 lb 3.2 oz (71.8 kg)   Body mass index is 25.15 kg/m.  Generalized: Well developed, in no acute distress  Head: normocephalic and atraumatic,. Oropharynx benign  Neck: Supple, no carotid bruits  Cardiac: Regular rate rhythm, no murmur  Skin no rash or petechiae Musculoskeletal: No deformity   Neurological examination   Mentation: Alert oriented to time, place, history taking. Attention span and concentration appropriate. Recent and remote memory intact.  Follows all commands speech and language fluent.   Cranial nerve II-XII: Pupils were equal round reactive to light extraocular movements were full, visual field were full on confrontational test. Facial sensation and strength were normal. hearing was intact to finger rubbing bilaterally. Uvula tongue midline. head turning and shoulder shrug were normal and symmetric.Tongue protrusion into cheek strength was normal. Motor: normal bulk and tone, full strength in the BUE, BLE, fine finger movements normal, no pronator drift. No focal weakness Sensory: normal and symmetric to light touch, pinprick, and  Vibration, in the upper and lower extremities Coordination: finger-nose-finger, heel-to-shin bilaterally, no dysmetria Reflexes: 1+ upper lower and symmetric, plantar responses were flexor bilaterally. Gait and Station: Rising up from seated position without assistance, normal stance,  moderate stride, good arm swing, smooth turning, able to  perform tiptoe, and heel walking without difficulty. Tandem gait is steady  DIAGNOSTIC DATA (LABS, IMAGING, TESTING) - I reviewed patient records, labs, notes, testing and imaging myself where available.  Lab Results  Component Value Date   WBC 5.2 08/05/2016   HGB 12.7 08/05/2016   HCT 37.7 08/05/2016   MCV 89.5 08/05/2016   PLT 298 08/05/2016      Component Value Date/Time   NA 141 08/05/2016 0850   K 4.2 08/05/2016 0850   CL 105 08/05/2016 0850   CO2 26 08/05/2016 0850   GLUCOSE 90 08/05/2016 0850   BUN 17 08/05/2016 0850   CREATININE 0.80 08/05/2016 0850   CREATININE 0.69 06/19/2015 0833   CALCIUM 9.4 08/05/2016 0850   PROT 6.2 (L) 03/25/2016 0528   ALBUMIN 3.4 (L) 03/25/2016 0528   AST 45 (H) 03/25/2016 0528   ALT 30 03/25/2016 0528   ALKPHOS 80 03/25/2016 0528   BILITOT 0.7 03/25/2016 0528   GFRNONAA >60 08/05/2016 0850   GFRNONAA >89 06/19/2015 0833   GFRAA >60 08/05/2016 0850   GFRAA >89 06/19/2015 0833   Lab Results  Component Value Date   CHOL 181 03/21/2016   HDL 90 03/21/2016   LDLCALC 84 03/21/2016   TRIG 37 03/21/2016   CHOLHDL 2.0 03/21/2016   Lab Results  Component Value Date   HGBA1C 5.3 03/22/2016      ASSESSMENT AND PLAN 72 year Caucasian lady with parenchymal right frontal intracerebral hemorrhage in July 2017 of indeterminate etiology. She had  complaints of left-sided numbness and tingling from the shoulder to the hand, no weakness was involved thought to be partial seizures EEG was ordered showing focal slowing on the right side which is to be expected given her previous intracerebral hemorrhage. She was started on Keppra XR 500 daily without further episodes.  PLAN: Continue aspirin 81 daily  Continue to monitor blood pressure at home reading in the office today 129/71 continue blood pressure medications Try to  get 30 minutes of exercise daily Healthy diet, whole grains fresh fruits and vegetables, lean meats Keppra XR 500mg  daily will  refill Call for seizure activity Please remember, common seizure triggers are: Sleep deprivation, dehydration, overheating, stress, hypoglycemia or skipping meals, certain medications or excessive alcohol use, especially stopping alcohol abruptly if you have had heavy alcohol use before (aka alcohol withdrawal seizure). If you have a prolonged seizure over 2-5 minutes or back to  back seizures, call or have someone call 911 or take you to the nearest emergency room. You cannot drive a car or operate any other machinery or vehicle within 6 months of a seizure. Please do not swim alone or take a tub bath for safety. Do not cook with large quantities of boiling water or oil for safety. Take your medicine for seizure prevention regularly and do not skip doses or stop medication abruptly and tone are told to do so by your healthcare provider. Try to get a refill on your antiepileptic medication ahead of time, so you are not at risk of running out. If you run out of the seizure medication and do not have a refill at hand she may run into medication withdrawal seizures. Avoid taking Wellbutrin, narcotic pain medications and tramadol, as they can lower seizure threshold.  F/U in 1 year Greater than 50% of time during this 25 minute visit was spent on counseling,explanation of diagnosis, planning of further management, discussion with patient  and coordination of care for stroke prevention,seizure disorder, discussing seizure precautions Dennie Bible, [facility], [facility], APRN  [facility] [address], [place], [place] [address] [phone]

## 2017-06-08 ENCOUNTER — Encounter: Payer: Self-pay | Admitting: Nurse Practitioner

## 2017-06-08 ENCOUNTER — Ambulatory Visit (INDEPENDENT_AMBULATORY_CARE_PROVIDER_SITE_OTHER): Payer: Medicare Other | Admitting: Nurse Practitioner

## 2017-06-08 VITALS — BP 129/71 | HR 74 | Wt 158.2 lb

## 2017-06-08 DIAGNOSIS — G40209 Localization-related (focal) (partial) symptomatic epilepsy and epileptic syndromes with complex partial seizures, not intractable, without status epilepticus: Secondary | ICD-10-CM | POA: Insufficient documentation

## 2017-06-08 DIAGNOSIS — R569 Unspecified convulsions: Secondary | ICD-10-CM | POA: Diagnosis not present

## 2017-06-08 DIAGNOSIS — I611 Nontraumatic intracerebral hemorrhage in hemisphere, cortical: Secondary | ICD-10-CM | POA: Diagnosis not present

## 2017-06-08 DIAGNOSIS — I10 Essential (primary) hypertension: Secondary | ICD-10-CM

## 2017-06-08 MED ORDER — LEVETIRACETAM ER 500 MG PO TB24: 500.0000 mg | ORAL_TABLET | Freq: Every day | ORAL | 3 refills | Status: DC

## 2017-06-08 NOTE — Progress Notes (Signed)
I agree with the above plan 

## 2017-06-08 NOTE — Patient Instructions (Addendum)
Continue aspirin 81 daily  Continue to monitor blood pressure at home reading in the office today 129/71 continue blood pressure medications Trying get 30 minutes of exercise daily Healthy diet, whole grains fresh fruits and vegetables, lean meats Keppra XR 500mg  daily will refill Call for seizure activity Please remember, common seizure triggers are: Sleep deprivation, dehydration, overheating, stress, hypoglycemia or skipping meals, certain medications or excessive alcohol use, especially stopping alcohol abruptly if you have had heavy alcohol use before (aka alcohol withdrawal seizure). If you have a prolonged seizure over 2-5 minutes or back to back seizures, call or have someone call 911 or take you to the nearest emergency room. You cannot drive a car or operate any other machinery or vehicle within 6 months of a seizure. Please do not swim alone or take a tub bath for safety. Do not cook with large quantities of boiling water or oil for safety. Take your medicine for seizure prevention regularly and do not skip doses or stop medication abruptly and tone are told to do so by your healthcare provider. Try to get a refill on your antiepileptic medication ahead of time, so you are not at risk of running out. If you run out of the seizure medication and do not have a refill at hand she may run into medication withdrawal seizures. Avoid taking Wellbutrin, narcotic pain medications and tramadol, as they can lower seizure threshold.   F/U in 1 year

## 2017-06-09 ENCOUNTER — Other Ambulatory Visit: Payer: Self-pay | Admitting: Family Medicine

## 2017-06-09 NOTE — Telephone Encounter (Signed)
Ok to refill 

## 2017-06-09 NOTE — Telephone Encounter (Signed)
ok 

## 2017-06-12 ENCOUNTER — Telehealth: Payer: Self-pay | Admitting: Nurse Practitioner

## 2017-06-12 NOTE — Telephone Encounter (Signed)
Pt forgot to ask NP Hoyle Sauer about her donating blood and platelets, she would like to know if it is okayed to do so, please call

## 2017-06-12 NOTE — Telephone Encounter (Signed)
Because you're on a blood thinner, aspirin you may not be able to donate. Check with the center

## 2017-06-13 NOTE — Telephone Encounter (Signed)
I spoke to pt and relayed that she needs to call the blood center and see what they say re: blood donation.  She will.

## 2017-06-24 LAB — HM MAMMOGRAPHY

## 2017-06-26 ENCOUNTER — Encounter: Payer: Self-pay | Admitting: Family Medicine

## 2017-06-26 ENCOUNTER — Other Ambulatory Visit: Payer: Self-pay | Admitting: Family Medicine

## 2017-06-26 ENCOUNTER — Other Ambulatory Visit: Payer: Medicare Other

## 2017-06-26 DIAGNOSIS — Z79899 Other long term (current) drug therapy: Secondary | ICD-10-CM

## 2017-06-26 DIAGNOSIS — I10 Essential (primary) hypertension: Secondary | ICD-10-CM

## 2017-06-26 DIAGNOSIS — M810 Age-related osteoporosis without current pathological fracture: Secondary | ICD-10-CM

## 2017-06-27 ENCOUNTER — Ambulatory Visit: Payer: Medicare Other | Admitting: Family Medicine

## 2017-06-27 LAB — CBC WITH DIFFERENTIAL/PLATELET
BASOS PCT: 0.3 %
Basophils Absolute: 23 cells/uL (ref 0–200)
EOS PCT: 1.5 %
Eosinophils Absolute: 113 cells/uL (ref 15–500)
HEMATOCRIT: 36.5 % (ref 35.0–45.0)
HEMOGLOBIN: 12.5 g/dL (ref 11.7–15.5)
LYMPHS ABS: 1860 {cells}/uL (ref 850–3900)
MCH: 30.8 pg (ref 27.0–33.0)
MCHC: 34.2 g/dL (ref 32.0–36.0)
MCV: 89.9 fL (ref 80.0–100.0)
MPV: 9.4 fL (ref 7.5–12.5)
Monocytes Relative: 8.6 %
NEUTROS ABS: 4860 {cells}/uL (ref 1500–7800)
Neutrophils Relative %: 64.8 %
Platelets: 321 10*3/uL (ref 140–400)
RBC: 4.06 10*6/uL (ref 3.80–5.10)
RDW: 12.5 % (ref 11.0–15.0)
Total Lymphocyte: 24.8 %
WBC: 7.5 10*3/uL (ref 3.8–10.8)
WBCMIX: 645 {cells}/uL (ref 200–950)

## 2017-06-27 LAB — LIPID PANEL
CHOL/HDL RATIO: 2.4 (calc) (ref <5.0)
CHOLESTEROL: 188 mg/dL (ref <200)
HDL: 78 mg/dL (ref >50)
LDL Cholesterol (Calc): 89 mg/dL (calc)
Non-HDL Cholesterol (Calc): 110 mg/dL (calc) (ref <130)
Triglycerides: 114 mg/dL (ref <150)

## 2017-06-27 LAB — COMPLETE METABOLIC PANEL WITH GFR
AG Ratio: 1.7 (calc) (ref 1.0–2.5)
ALT: 14 U/L (ref 6–29)
AST: 19 U/L (ref 10–35)
Albumin: 4 g/dL (ref 3.6–5.1)
Alkaline phosphatase (APISO): 58 U/L (ref 33–130)
BUN: 23 mg/dL (ref 7–25)
CALCIUM: 9.4 mg/dL (ref 8.6–10.4)
CO2: 28 mmol/L (ref 20–32)
CREATININE: 0.83 mg/dL (ref 0.50–0.99)
Chloride: 107 mmol/L (ref 98–110)
GFR, EST NON AFRICAN AMERICAN: 72 mL/min/{1.73_m2} (ref >=60)
GFR, Est African American: 83 mL/min/{1.73_m2} (ref >=60)
GLOBULIN: 2.4 g/dL (ref 1.9–3.7)
GLUCOSE: 88 mg/dL (ref 65–99)
Potassium: 4.3 mmol/L (ref 3.5–5.3)
SODIUM: 143 mmol/L (ref 135–146)
Total Bilirubin: 0.7 mg/dL (ref 0.2–1.2)
Total Protein: 6.4 g/dL (ref 6.1–8.1)

## 2017-06-27 LAB — VITAMIN D 25 HYDROXY (VIT D DEFICIENCY, FRACTURES): Vit D, 25-Hydroxy: 39 ng/mL (ref 30–100)

## 2017-06-30 ENCOUNTER — Encounter: Payer: Self-pay | Admitting: Family Medicine

## 2017-06-30 ENCOUNTER — Ambulatory Visit (INDEPENDENT_AMBULATORY_CARE_PROVIDER_SITE_OTHER): Payer: Medicare Other | Admitting: Family Medicine

## 2017-06-30 VITALS — BP 122/74 | HR 72 | Temp 97.9°F | Resp 16 | Ht 66.5 in | Wt 158.0 lb

## 2017-06-30 DIAGNOSIS — R350 Frequency of micturition: Secondary | ICD-10-CM | POA: Diagnosis not present

## 2017-06-30 LAB — URINALYSIS, ROUTINE W REFLEX MICROSCOPIC
Bilirubin Urine: NEGATIVE
GLUCOSE, UA: NEGATIVE
Ketones, ur: NEGATIVE
Nitrite: NEGATIVE
PH: 6 (ref 5.0–8.0)
Protein, ur: NEGATIVE
SQUAMOUS EPITHELIAL / LPF: NONE SEEN /HPF (ref <=5)
Specific Gravity, Urine: 1.01 (ref 1.001–1.03)

## 2017-06-30 LAB — MICROSCOPIC MESSAGE

## 2017-06-30 MED ORDER — SULFAMETHOXAZOLE-TRIMETHOPRIM 800-160 MG PO TABS: 1.0000 | ORAL_TABLET | Freq: Two times a day (BID) | ORAL | 0 refills | Status: DC

## 2017-06-30 NOTE — Progress Notes (Signed)
Subjective:    Patient ID: Cynthia [name], female    DOB: 09-07-1946, 71 y.o.   MRN: [id number]  HPI 1 week of frequency, urgency, dysuria.  Urinalysis today shows +3 leukocyte esterase and negative nitrites. She denies any fevers or chills. She denies any low back pain. She denies any hematuria. She does report some vaginal dryness which may be triggering her bladder infections. Past Medical History:  Diagnosis Date  . Colon polyps   . Depression    history of  . Osteoporosis   . Stroke [facility])    Past Surgical History:  Procedure Laterality Date  . IR GENERIC HISTORICAL  08/05/2016   IR ANGIO VERTEBRAL SEL VERTEBRAL UNI R MOD SED 08/05/2016 Luanne Bras, MD [facility]  . IR GENERIC HISTORICAL  08/05/2016   IR ANGIO INTRA EXTRACRAN SEL COM CAROTID INNOMINATE BILAT MOD SED 08/05/2016 Luanne Bras, MD [facility]  . IR GENERIC HISTORICAL  08/05/2016   IR ANGIO VERTEBRAL SEL SUBCLAVIAN INNOMINATE UNI L MOD SED 08/05/2016 Luanne Bras, MD [facility]   Current Outpatient Prescriptions on File Prior to Visit  Medication Sig Dispense Refill  . alendronate (FOSAMAX) 70 MG tablet Take 1 tablet (70 mg total) by mouth every 7 (seven) days. Take with a full glass of water on an empty stomach. 4 tablet 11  . ALPRAZolam (XANAX) 0.25 MG tablet TAKE 1 TABLET 3 TIMES DAILY AS NEEDED FOR ANXIETY 90 tablet 0  . aspirin EC 81 MG tablet Take 81 mg by mouth daily.    . Calcium Carbonate-Vitamin D3 (CALCIUM 600-D) 600-400 MG-UNIT TABS Take 1 tablet by mouth 2 (two) times daily.    . cetirizine (ZYRTEC) 10 MG tablet Take 10 mg by mouth daily.     Marland Kitchen EPIPEN 2-PAK 0.3 MG/0.3ML SOAJ injection INJECT 0.3 MLS (0.3 MG TOTAL) INTO THE MUSCLE ONCE. 2 Device 0  . famotidine (PEPCID) 20 MG tablet Take 20 mg by mouth daily as needed for heartburn or indigestion.    . ferrous sulfate 325 (65 FE) MG tablet Take 325 mg by mouth daily.     Marland Kitchen levETIRAcetam (KEPPRA XR) 500 MG 24 hr tablet Take 1  tablet (500 mg total) by mouth daily. 90 tablet 3  . loratadine (CLARITIN) 10 MG tablet Take 10 mg by mouth daily.    Marland Kitchen losartan (COZAAR) 100 MG tablet Take 1 tablet (100 mg total) by mouth daily. 90 tablet 3   No current facility-administered medications on file prior to visit.    Allergies  Allergen Reactions  . Other Anaphylaxis    FIRE ANTS Insect stings / bites  . Codeine     Syncope   . Penicillins Rash    Has patient had a PCN reaction causing immediate rash, facial/tongue/throat swelling, SOB or lightheadedness with hypotension: Unknown Has patient had a PCN reaction causing severe rash involving mucus membranes or skin necrosis: No Has patient had a PCN reaction that required hospitalization No Has patient had a PCN reaction occurring within the last 10 years: No If all of the above answers are "NO", then may proceed with Cephalosporin use.    Social History   Social History  . Marital status: Married    Spouse name: N/A  . Number of children: 3  . Years of education: N/A   Occupational History  .      teacher, retired   Social History Main Topics  . Smoking status: Never Smoker  . Smokeless tobacco: Never Used  . Alcohol  use 0.6 oz/week    1 Glasses of wine per week     Comment: 1-2 beers/wine in evening  . Drug use: No  . Sexual activity: Not on file     Comment: Married since 2005   Other Topics Concern  . Not on file   Social History Narrative  . No narrative on file      Review of Systems  All other systems reviewed and are negative.      Objective:   Physical Exam  Constitutional: She appears well-developed and well-nourished.  Cardiovascular: Normal rate, regular rhythm and normal heart sounds.   Pulmonary/Chest: Effort normal and breath sounds normal. No respiratory distress. She has no wheezes. She has no rales.  Abdominal: Soft. Bowel sounds are normal. She exhibits no distension. There is no tenderness. There is no rebound and no  guarding.  Musculoskeletal: She exhibits no edema.  Vitals reviewed.         Assessment & Plan:  Frequent urination - Plan: Urinalysis, Routine w reflex microscopic  Urinalysis suggests urinary tract infection. She previously had a bad reaction to Cipro. I will switch the patient and use Bactrim double strength tablets 1 by mouth twice a day for 5 days

## 2017-07-03 ENCOUNTER — Encounter: Payer: Medicare Other | Admitting: Family Medicine

## 2017-07-25 ENCOUNTER — Other Ambulatory Visit: Payer: Self-pay | Admitting: Family Medicine

## 2017-07-25 NOTE — Telephone Encounter (Signed)
ok 

## 2017-07-25 NOTE — Telephone Encounter (Signed)
Medication called to pharmacy. 

## 2017-07-25 NOTE — Telephone Encounter (Signed)
Ok to refill 

## 2017-08-27 ENCOUNTER — Other Ambulatory Visit: Payer: Self-pay | Admitting: Family Medicine

## 2017-08-29 ENCOUNTER — Other Ambulatory Visit: Payer: Self-pay | Admitting: Family Medicine

## 2017-09-10 ENCOUNTER — Other Ambulatory Visit: Payer: Self-pay | Admitting: Family Medicine

## 2017-09-11 NOTE — Telephone Encounter (Signed)
Pt is requesting refill on Xanax   LOV: 06/30/17  LRF:   07/25/17

## 2017-09-21 ENCOUNTER — Other Ambulatory Visit: Payer: Self-pay | Admitting: Family Medicine

## 2017-09-27 ENCOUNTER — Other Ambulatory Visit: Payer: Self-pay | Admitting: Family Medicine

## 2017-09-27 MED ORDER — FAMOTIDINE 20 MG PO TABS: 20.0000 mg | ORAL_TABLET | Freq: Two times a day (BID) | ORAL | 2 refills | Status: DC

## 2017-10-22 ENCOUNTER — Other Ambulatory Visit: Payer: Self-pay | Admitting: Family Medicine

## 2017-10-23 NOTE — Telephone Encounter (Signed)
Requesting refill    Xanax  LOV: 06/30/17 LRF:  09/11/17

## 2017-11-24 IMAGING — CT CT ANGIO HEAD
1 of 19 series · 1 of 33 positions shown · IV contrast (isovue)
Comparison: 03/20/2016 head CT and brain MR.

CLINICAL DATA: 70-year-old female with intracranial hemorrhage.
Left-sided weakness. Subsequent encounter.

EXAM:
CT ANGIOGRAPHY HEAD AND NECK
CT VENOGRAM
TECHNIQUE: Multidetector CT imaging of the head and neck was performed using
the standard protocol during bolus administration of intravenous
contrast. Multiplanar CT image reconstructions and MIPs were
obtained to evaluate the vascular anatomy. Carotid stenosis
measurements (when applicable) are obtained utilizing NASCET
criteria, using the distal internal carotid diameter as the
denominator.
CONTRAST:  50 cc Isovue 370.

[Series 300: locator · axial · 0.49mm/px · 1 of 1 slices shown]
[im 1/1  soft-tissue]
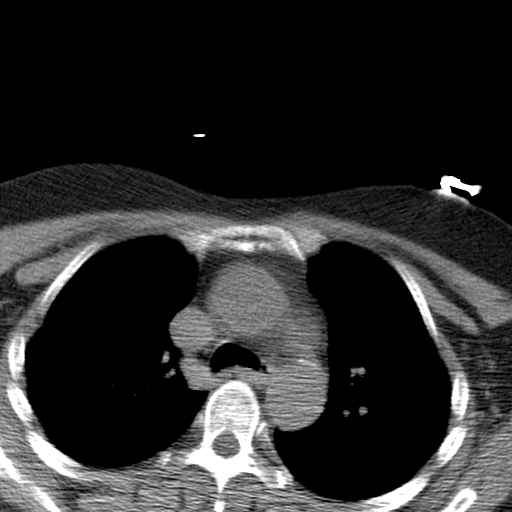

[1 of 33 positions shown; findings below may reference images not displayed]

FINDINGS: CT HEAD

Brain: Right frontal lobe bilobed hematoma spanning over 3.2 x 4.3 x
3.7 cm with surrounding vasogenic edema and mass effect upon the
right lateral ventricle (which is displaced inferiorly) appears
similar to recent MR/CT.

Calvarium and skull base: Negative for acute abnormality.

Paranasal sinuses: Moderate air-fluid level right maxillary sinus.
Small air-fluid level left sphenoid sinus.

Orbits: No acute abnormality.

CTA NECK

Aortic arch: 3 vessel arch. Calcification and minimal bulge
undersurface aortic arch.

Right carotid system: Ectatic without significant stenosis.

Left carotid system: Ectatic without significant stenosis.

Vertebral arteries:No significant stenosis. Left vertebral artery is
dominant.

Skeleton: Reversal normal cervical lordosis with cervical
spondylotic changes most prominent C3-4 thru C6-7. Prominent
mandibular torus incidentally noted.

Other neck: Right thyroid 2.6 cm lesion. Thyroid ultrasound
recommended for further delineation. No worrisome lung apical
lesion.

CTA HEAD

Anterior circulation: Anterior circulation without medium or large
size vessel significant stenosis or occlusion. Fetal contribution to
the posterior cerebral artery more notable on the right. Minimal
calcified plaque cavernous segment right internal carotid artery
with slight narrowing. Vessels are displaced by right frontal lobe
hematoma. Posterior to the right frontal lobe hematoma, slight
prominence of vessels medial aspect of the junction of the right
frontal and parietal lobe (series 504, image 135). Question crowding
of vessels versus small vascular malformation involving distal right
anterior cerebral artery.

Posterior circulation: Left vertebral artery is dominant. Mild
narrowing and irregularity basilar artery without high-grade
stenosis.

Venous sinuses: As below.

Anatomic variants: As above.

Delayed phase: As above.

CT VENOGRAM OF THE HEAD:

Major dural sinuses are patent. No obvious cortical vein thrombosis.
IMPRESSION: CT HEAD

Right frontal lobe bilobed hematoma spanning over 3.2 x 4.3 x 3.7 cm
with surrounding vasogenic edema and mass effect upon the right
lateral ventricle (which is displaced inferiorly) appears similar to
recent MR/CT.

Moderate air-fluid level right maxillary sinus. Small air-fluid
level left sphenoid sinus.

CTA NECK

Ectatic carotid arteries without significant stenosis.

No significant stenosis of either vertebral artery. Left vertebral
artery is dominant.

Reversal normal cervical lordosis with cervical spondylotic changes
most prominent C3-4 thru C6-7.

Right thyroid 2.6 cm lesion. Thyroid ultrasound recommended for
further delineation.

CTA HEAD

Anterior circulation without medium or large size vessel significant
stenosis or occlusion.

Vessels are displaced by right frontal lobe hematoma.

Posterior to the right frontal lobe hematoma, slight prominence of
vessels medial aspect of the junction of the right frontal and
parietal lobe (series 504, image 135). Question crowding of vessels
versus small vascular malformation involving distal right anterior
cerebral artery.

Left vertebral artery is dominant. Mild narrowing and irregularity
basilar artery without high-grade stenosis.

CT VENOGRAM OF THE HEAD

Major dural sinuses are patent. No obvious cortical vein thrombosis.

## 2017-11-25 IMAGING — XA IR ANGIO VETEBRAL SEL VERTEBRAL BILAT MOD SED
1 series · 12 of 24 positions shown · IV contrast (IODINE)
Comparison: none

CLINICAL DATA: Headaches. Left-sided weakness. Right gaze
deviation. Intracranial hemorrhage on CT scan of the brain.

[Series 300: dr. (person_name) · 12 of 241 slices shown]
[im 11/241]
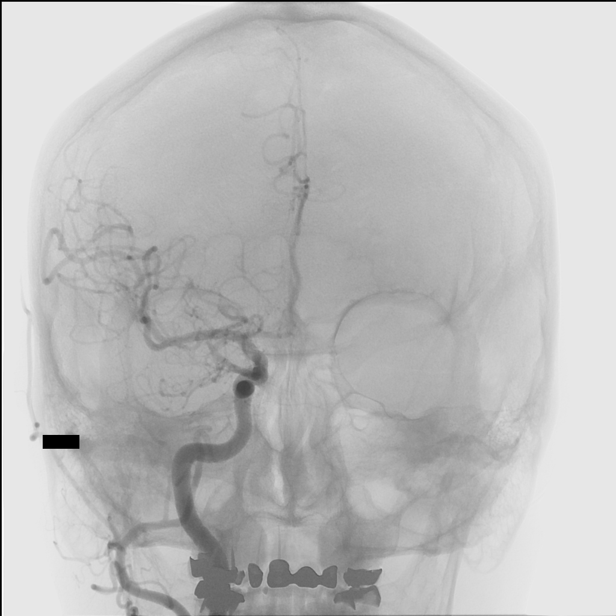
[im 32/241]
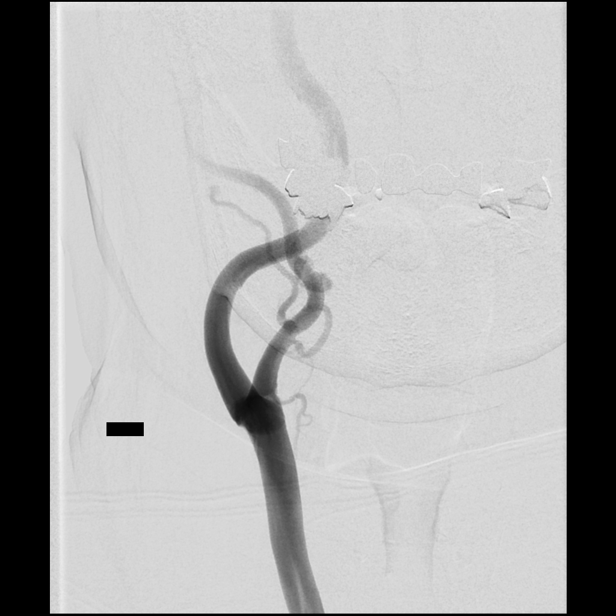
[im 53/241]
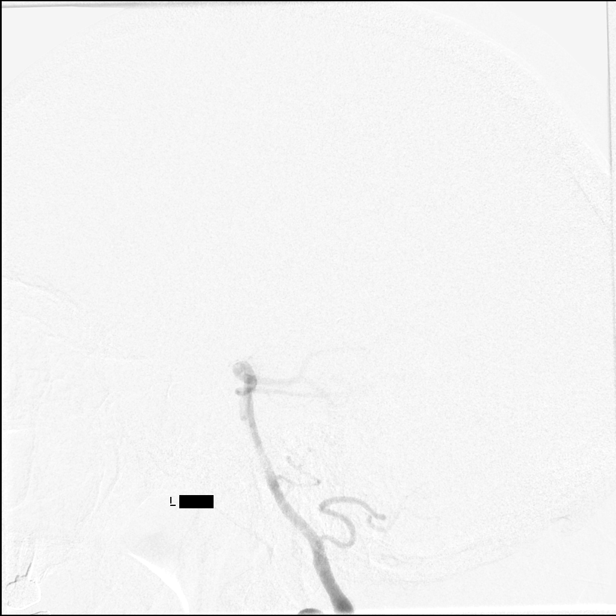
[im 74/241]
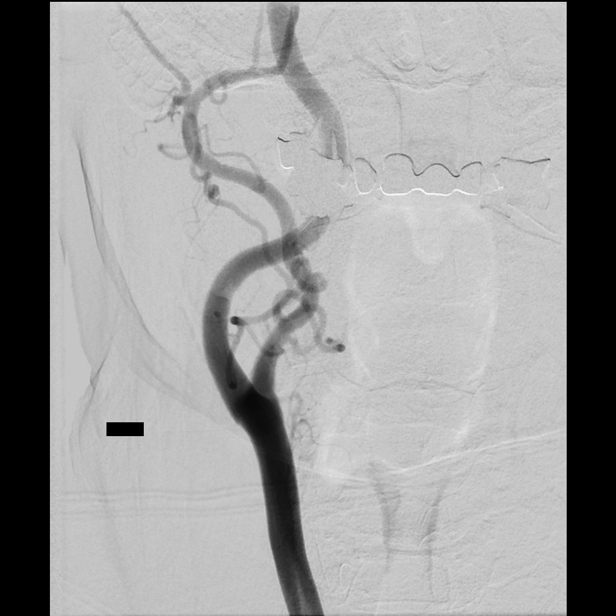
[im 94/241]
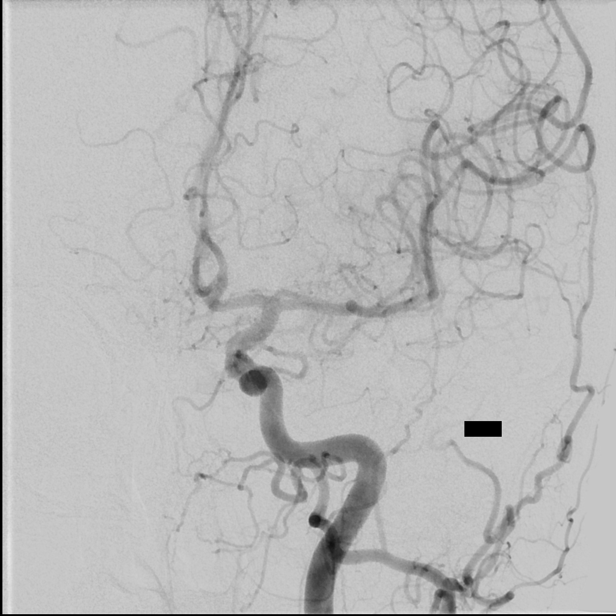
[im 115/241]
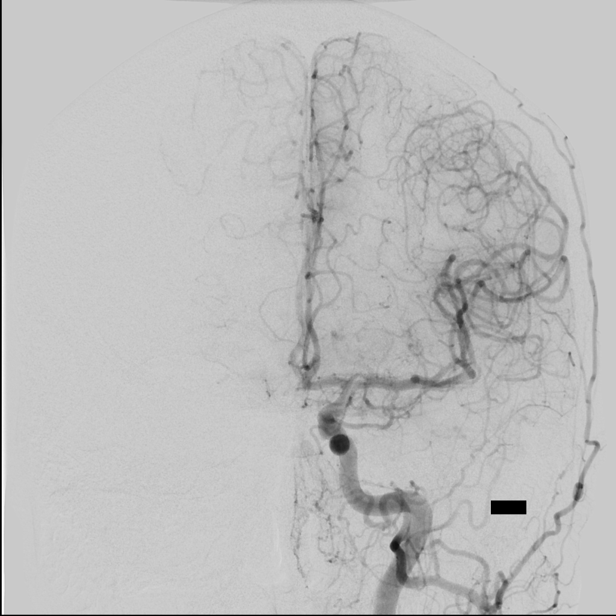
[im 136/241]
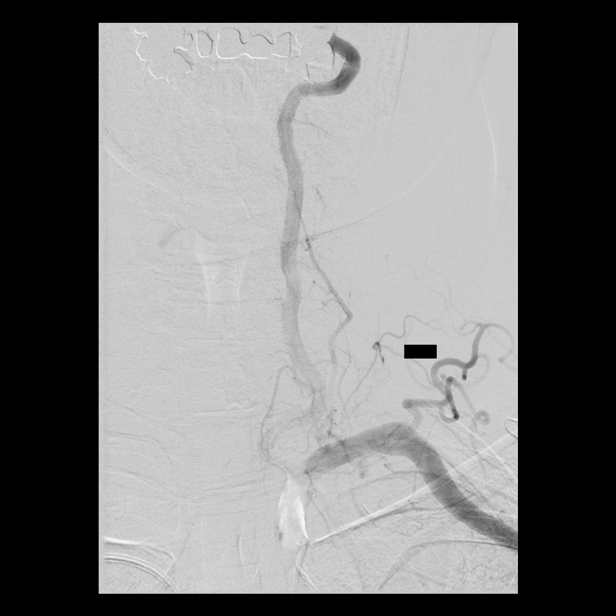
[im 157/241]
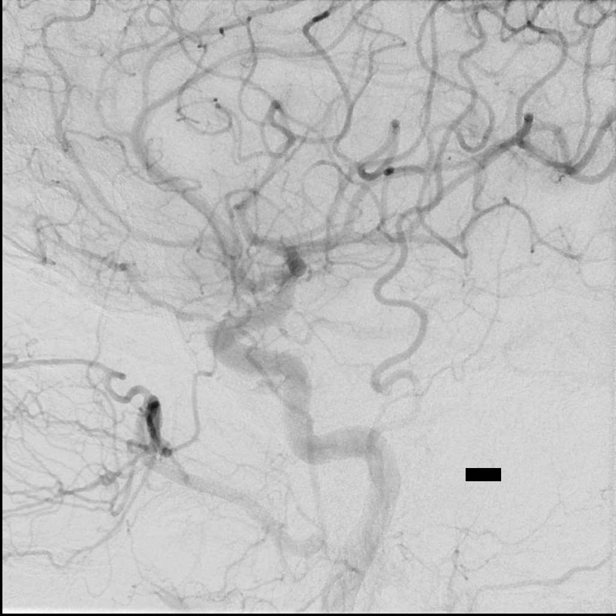
[im 178/241]
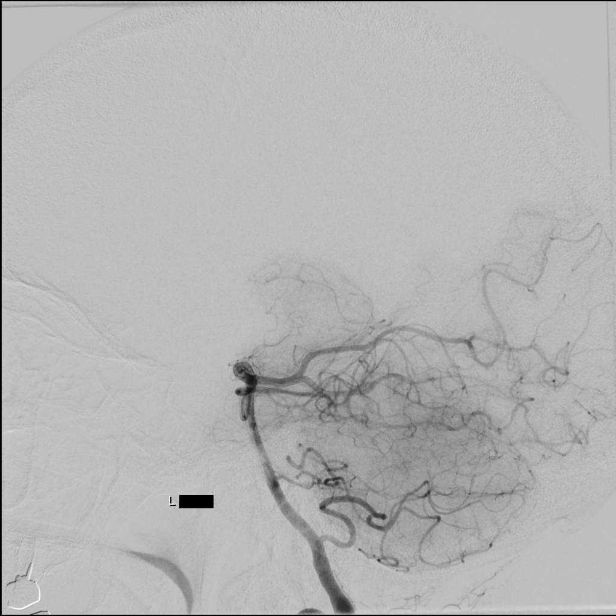
[im 199/241]
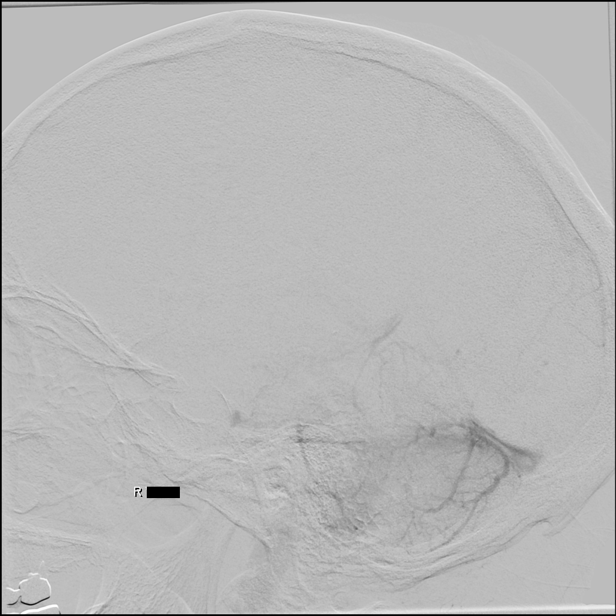
[im 220/241]
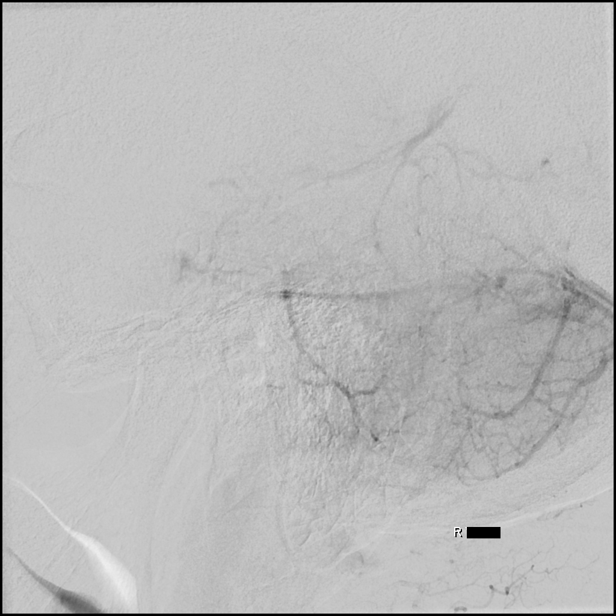
[im 241/241]
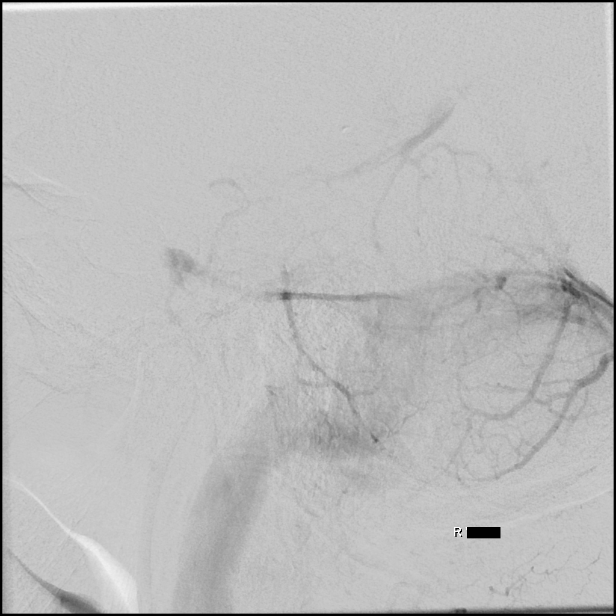

[12 of 24 positions shown; findings below may reference images not displayed]

EXAM:
BILATERAL COMMON CAROTID AND INNOMINATE ANGIOGRAPHY AND BILATERAL
VERTEBRAL ARTERY ANGIOGRAMS

PROCEDURE:
Contrast: 60 mL [id number] IOPAMIDOL (FLC4SM-VJJ) INJECTION 61%

Anesthesia/Sedation:  Conscious sedation.

Medications: Fentanyl 25 mcg IV.

Following a full explanation of the procedure along with the
potential associated complications, an informed witnessed consent
was obtained.

The right groin was prepped and draped in the usual sterile fashion.
Thereafter using modified Seldinger technique, transfemoral access
into the right common femoral artery was obtained without
difficulty. Over a 0.035 inch guidewire, a 5 French Pinnacle sheath
was inserted. Through this, and also over 0.035 inch guidewire, a 5
French JB 1 catheter was advanced to the aortic arch region and
selectively positioned in the right common carotid artery, the right
vertebral artery, the left common carotid artery and the left
vertebral artery.

There were no acute complications. The patient tolerated the
procedure well.
FINDINGS: The right vertebral artery origin is normal.

The vessel is seen to opacify normally to the cranial skull base.

Mild FMD-like changes are seen involving the vertical segment of the
right vertebral artery at C1.

Normal opacification is seen of the right posterior-inferior
cerebellar artery and the right vertebrobasilar junction.

The opacified portion of the basilar artery, the left posterior
cerebral artery, superior cerebellar arteries and the
anterior-inferior cerebellar arteries is normal into the capillary
and the venous phases.

The right common carotid arteriogram demonstrates the right external
carotid artery and its major branches to be widely patent.

The right internal carotid artery at the bulb to the cranial skull
base opacifies normally.

The petrous, cavernous and the supraclinoid segments are widely
patent.

There is mild broad-based eccentric dilatation of the superior
hypophyseal right ICA.

A dominant right posterior cerebral artery is seen opacifying the
right posterior cerebral artery distribution.

The right middle cerebral artery and the right anterior cerebral
artery are seen to opacify into the capillary and the venous phases.
Mild mass-effect from right to left is seen of the right anterior
cerebral artery A2 segment. A prominent focal area of hypoperfusion
is seen involving the right frontal lobe with delayed capillary
filling.

There is mild to moderate abnormal prominence of the frontal orbital
branch of the right anterior cerebral artery. The venous phase
demonstrates no abnormal stasis of contrast within the cortical
cerebral veins. The dural venous sinuses are widely patent.

The left common carotid arteriogram demonstrates the left external
carotid artery and its major branches to be widely patent.

The left internal carotid artery at the bulb to the cranial skull
base opacifies normally. The petrous, cavernous and the supraclinoid
segments are widely patent.

There is mild focal outpouching of the right internal carotid artery
superior hypophyseal region ICA.

Flash filling of the left posterior cerebral artery via the left
posterior communicating artery is seen.

The left middle and the left anterior cerebral arteries opacify
normally into the capillary and the venous phases. Cross
opacification via the anterior communicating artery of the right
anterior cerebral artery A2 segment is seen. The left vertebral
artery origin is normal.

The vessel is seen to opacify normally to the cranial skull base.
Mild FMD-like changes are seen involving the posterior aspect of the
vertical segment of the left vertebral artery at the level of C1-C2.

Distal to this, the left vertebrobasilar junction and the left
posterior-inferior cerebellar arteries are seen to opacify normally.

Wide patency is seen of the basilar artery, the left posterior
cerebral artery, superior cerebellar arteries and the
anterior-inferior cerebellar arteries into the capillary and the
venous phases. Non-opacified blood is seen in the proximal basilar
artery from the contralateral vertebral artery.

Nonvisualization of the right posterior cerebral arteries related to
prominent inflow from the anterior circulation via the posterior
communicating artery as mentioned above.
IMPRESSION: Angiographically no evidence of arteriovenous shunting, intracranial
aneurysms, or arteriovenous malformation is seen on this study.

Prominent area of hypoperfusion involving the right frontal lobe
anteriorly with mass effect from right to left most likely related
to the intracerebral hemorrhage seen on the CT scan.

Prominent frontal polar branch of the right anterior cerebral artery
A1 segment extending into the anterior inferior portion of the
larger area of hyperperfusion. This could signify a potential feeder
to a vascular malformation not angiographically visualized on
today's study.

## 2017-12-08 ENCOUNTER — Other Ambulatory Visit: Payer: Self-pay | Admitting: Family Medicine

## 2017-12-08 NOTE — Telephone Encounter (Signed)
Ok to refill??  Last office visit 06/30/2017.  Last refill 10/23/2017.

## 2018-01-18 ENCOUNTER — Other Ambulatory Visit: Payer: Self-pay | Admitting: Family Medicine

## 2018-01-18 NOTE — Telephone Encounter (Signed)
Ok to refill??  Last office visit 06/30/2017.  Last refill 12/11/2017.

## 2018-03-11 ENCOUNTER — Other Ambulatory Visit: Payer: Self-pay | Admitting: Family Medicine

## 2018-03-12 NOTE — Telephone Encounter (Signed)
Pt is requesting refill on Xanax   LOV: 06/30/17  LRF:   01/18/18

## 2018-04-19 ENCOUNTER — Other Ambulatory Visit: Payer: Self-pay | Admitting: Family Medicine

## 2018-04-19 NOTE — Telephone Encounter (Signed)
Pt is requesting refill on Xanax   LOV: 06/30/17  LRF:   03/12/18

## 2018-05-23 ENCOUNTER — Other Ambulatory Visit: Payer: Self-pay | Admitting: Family Medicine

## 2018-06-05 ENCOUNTER — Other Ambulatory Visit: Payer: Self-pay | Admitting: Nurse Practitioner

## 2018-06-05 DIAGNOSIS — R569 Unspecified convulsions: Secondary | ICD-10-CM

## 2018-06-06 NOTE — Progress Notes (Signed)
[facility]  [name]: Cynthia [name] DOB: [birth year]   REASON FOR VISIT: Follow-up for complaints of numbness left side thought to be  partial seizures with history of intracerebral hemorrhage in July 2017 HISTORY FROM: [name]    HISTORY OF PRESENT ILLNESS:UPDATE 9/30/2019CM Cynthia [name], 72 year old female returns for follow-up with history of intracranial hemorrhage in July 2017.  She is also had episodes suggestive of simple partial seizures none recently.  She is currently on Keppra 500 mg XR daily.  She is on aspirin for secondary stroke prevention without recurrent stroke or TIA symptoms.  She has no bruising and no bleeding.  Blood pressure well controlled in Cynthia office today at 123/68.  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  She has a lot of anxiety related to an alcoholic husband.  She has Xanax to take as needed.  She exercises maybe 3 times a week.  She has stopped her yoga class was encouraged to get back into that.  She complains of decreased energy level.  She denies any visual difficulty speech or swallowing problems balance issues or falls.  No interval medical issues.  She returns for reevaluation   UPDATE 09/27/2018CM Cynthia [name], 72 year old female returns for follow-up with a history of intracranial hemorrhage in July 2017. She also has a history of episodes suggestive of simple partial seizures and was started on Keppra 500 mg daily. She has not had further episodes.She denies side effects to Cynthia medication. She remains on aspirin for secondary stroke prevention without recurrent stroke or TIA symptoms. She has minimal bruising and no bleeding.Blood pressure in Cynthia office today 129/71. She continues to teach part time. She denies any speech disturbance swallowing problems, visual difficulties, weakness, balance issues or falls. She returns for reevaluation  UPDATE 03/27/2018CM Cynthia [name], 72 year old female returns for follow-up with a history  of intracranial hemorrhage July 2017. When last seen she was having episodes of numbness left side lasting 5-10 minutes. EEG was ordered showing focal slowing on Cynthia right side which is to be expected given her previous intracerebral hemorrhage. She had another episode of transient paresthesias starting in Cynthia left shoulder and spreading gradually down into Cynthia fingertips of Cynthia left hand over   a few minutes. These episodes are suggestive of simple partial seizure. She was started on Keppra after her EEG was performed , she is currently taking Keppra XR 500 mg daily even though Cynthia EEG was negative for seizures. She has had one similar episode since it lasted several minutes shortly after starting Cynthia Alden. She remains on aspirin secondary stroke prevention. She denies any weakness speech disturbance swallowing problems visual difficulties and falls or balance issues. 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 She has had 2 of these events in Cynthia last 2 months. She had hospital admission in July 2017 for intracranial hemorrhage. Follow-up angiogram in November revealed no AV malformation or aneurysms. Her blood pressure does fluctuate however is well controlled in Cynthia office today at 122/64. She has had episodes where her left arm from her shoulder to her hand became numb and tingling however she denied any weakness with this ,one time her face was involved there again numbness but no weakness. She denies any speech disturbance, swallowing problems visual difficulty, problems with balance or falls or other associated symptoms. She denies headache. Her aspirin 0.81 was  restarted by her primary care physician on 09/22/2016. 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 72 year old right-handed woman who presented to Cynthia [facility] Emergency department after being sent from her doctor's office due to concern for possible stroke.Cynthia [name] reported that Cynthia night of 03/19/2016 (LKW, time unknown) she developed a sense of feeling disoriented and having difficulty speaking. When asked about her speech, she described that she had trouble focusing her thoughts and getting her words out. She reported that her sister also noticed that she seemed slow to respond to questions. She felt as if her speech was somewhat slurred as well. She did not endorse any weakness, numbness, vision changes, difficulty swallowing, or balance impairments. However, she had noticed that she will find her left arm in unusual postures and positions without being aware that it is doing anything. When she looked at it, she was able to control it without a problem and again denied any weakness or sensory changes. She also had a headache for Cynthia past 2 days. She denies any similar previous episodes. She denied any recent trauma to Cynthia head. She did note that she was working with her brother at her home and had been up on a ladder. She was getting off Cynthia ladder, she thought she was on Cynthia ground but instead was on Cynthia second step of Cynthia latter when she stumbled backwards. She says that she struck her bottom on a nearby chair, then fell to Cynthia floor landing on her bottom. She did not strike her head. She has not had any recent illness. She does not use daily antiplatelet therapy or anticoagulation. She uses occasional Advil for headache and pain and has taken a total of six 200 mg tablets since last night. She also reports that she took a baby aspirin earlier today as recommended by her sister because of her symptoms.CT brain on admission showed a large right frontal ICH. [name] was not administered IV t-PA  secondary to [place]. She was admitted to Cynthia neuro ICU for further evaluation and treatment. Her blood pressure was tightly controlled initially with IV drip and subsequently with oral medications. Follow-up CT scan showed stable appearance of Cynthia hemorrhage. There was mild cytotoxic edema but no hydrocephalus or any hemorrhage expansion. CT angiogram and CT venogram did not show any aneurysms or venous sinus thrombosis but there were engorged vessels in Cynthia region of Cynthia hemorrhage raising concern about a small AVM hence cerebral catheter angiogram was performed and findings of which are also indeterminate and showed abnormal right frontopolar branches .She had persistent left-sided weakness but her speech and word finding difficulties improved. She was transferred to Cynthia neurology floor in a condition remained stable. She was seen by physical occupational therapist and felt to be a good candidate for inpatient rehabilitation. She was accepted for transfer to rehabilitation in stable condition. She states she's done well since discharge. She has [name] outpatient physical and occupational therapy. She is back to her baseline and has no complaints or deficits from her intracerebral hemorrhage. She does feel she gets tired easily and at times her left-sided balance and coordination may be off. She states her blood pressure is well controlled and today it is 137/87 in office. She is wondering if she needs follow-up imaging studies. She has no complaints  REVIEW OF SYSTEMS:  Full 14 system review of systems performed and notable only for those listed, all others are neg:  Constitutional: Fatigue Cardiovascular: neg Ear/Nose/Throat: neg  Skin: neg Eyes: neg Respiratory: neg Gastroitestinal: neg  Hematology/Lymphatic: neg  Endocrine: neg Musculoskeletal:neg Allergy/Immunology: neg Neurological: neg Psychiatric: Anxiety Sleep : neg   ALLERGIES: Allergies  Allergen Reactions  . Other Anaphylaxis    FIRE  ANTS Insect stings / bites  . Acyclovir And Related   . Codeine     Syncope   . Penicillins Rash    Has [name] had a PCN reaction causing immediate rash, facial/tongue/throat swelling, SOB or lightheadedness with hypotension: Unknown Has [name] had a PCN reaction causing severe rash involving mucus membranes or skin necrosis: No Has [name] had a PCN reaction that required hospitalization No Has [name] had a PCN reaction occurring within Cynthia last 10 years: No If all of Cynthia above answers are "NO", then may proceed with Cephalosporin use.     HOME MEDICATIONS: Outpatient Medications Prior to Visit  Medication Sig Dispense Refill  . alendronate (FOSAMAX) 70 MG tablet TAKE 1 TABLET BY MOUTH EVERY 7 DAYS. TAKE WITH FULL GLASS OF WATER ON AN EMPTY STOMACH 4 tablet 11  . ALPRAZolam (XANAX) 0.25 MG tablet TAKE 1 TABLET 3 TIMES DAILY AS NEEDED FOR ANXIETY 90 tablet 0  . aspirin EC 81 MG tablet Take 81 mg by mouth daily.    . Calcium Carbonate-Vitamin D3 (CALCIUM 600-D) 600-400 MG-UNIT TABS Take 1 tablet by mouth 2 (two) times daily.    . cetirizine (ZYRTEC) 10 MG tablet Take 10 mg by mouth daily.     Marland Kitchen EPIPEN 2-PAK 0.3 MG/0.3ML SOAJ injection USE AS DIRECTED 2 Device 1  . famotidine (PEPCID) 20 MG tablet Take 1 tablet (20 mg total) by mouth 2 (two) times daily. 180 tablet 2  . ferrous sulfate 325 (65 FE) MG tablet Take 325 mg by mouth daily.     Marland Kitchen levETIRAcetam (KEPPRA XR) 500 MG 24 hr tablet TAKE 1 TABLET BY MOUTH EVERY DAY 90 tablet 0  . losartan (COZAAR) 100 MG tablet TAKE 1 TABLET BY MOUTH EVERY DAY 90 tablet 3  . ALPRAZolam (XANAX) 0.25 MG tablet TAKE 1 TABLET 3 TIMES DAILY AS NEEDED FOR ANXIETY 90 tablet 0  . EPIPEN 2-PAK 0.3 MG/0.3ML SOAJ injection INJECT 0.3 MLS (0.3 MG TOTAL) INTO Cynthia MUSCLE ONCE. 2 Device 0  . famotidine (PEPCID) 20 MG tablet Take 20 mg by mouth daily as needed for heartburn or indigestion.    Marland Kitchen loratadine (CLARITIN) 10 MG tablet Take 10 mg by mouth daily.    Marland Kitchen  acetaminophen-codeine (TYLENOL #3) 300-30 MG tablet Take 1 tablet by mouth every 6 (six) hours as needed. for pain  0  . sulfamethoxazole-trimethoprim (BACTRIM DS,SEPTRA DS) 800-160 MG tablet Take 1 tablet by mouth 2 (two) times daily. ([name] not taking: Reported on 06/11/2018) 14 tablet 0   No facility-administered medications prior to visit.     PAST MEDICAL HISTORY: Past Medical History:  Diagnosis Date  . Colon polyps   . Depression    history of  . Osteoporosis   . Stroke [facility])     PAST SURGICAL HISTORY: Past Surgical History:  Procedure Laterality Date  . IR GENERIC HISTORICAL  08/05/2016   IR ANGIO VERTEBRAL SEL VERTEBRAL UNI R MOD SED 08/05/2016 Luanne Bras, MD [facility]  . IR GENERIC HISTORICAL  08/05/2016   IR ANGIO INTRA EXTRACRAN SEL COM CAROTID INNOMINATE BILAT MOD SED 08/05/2016 Luanne Bras,  MD [facility]  . IR GENERIC HISTORICAL  08/05/2016   IR ANGIO VERTEBRAL SEL SUBCLAVIAN INNOMINATE UNI L MOD SED 08/05/2016 Luanne Bras, MD [facility]    FAMILY HISTORY: Family History  Problem Relation Age of Onset  . Heart disease Mother   . Cancer Mother        lung  . Stroke Paternal Grandmother     SOCIAL HISTORY: Social History   Socioeconomic History  . Marital status: Married    Spouse name: Not on file  . Number of children: 3  . Years of education: Not on file  . Highest education level: Not on file  Occupational History    Comment: teacher, retired  Scientific laboratory technician  . Financial resource strain: Not on file  . Food insecurity:    Worry: Not on file    Inability: Not on file  . Transportation needs:    Medical: Not on file    Non-medical: Not on file  Tobacco Use  . Smoking status: Never Smoker  . Smokeless tobacco: Never Used  Substance and Sexual Activity  . Alcohol use: Yes    Alcohol/week: 1.0 standard drinks    Types: 1 Glasses of wine per week    Comment: 1-2 beers/wine in evening  . Drug use: No  . Sexual  activity: Not on file    Comment: Married since 2005  Lifestyle  . Physical activity:    Days per week: Not on file    Minutes per session: Not on file  . Stress: Not on file  Relationships  . Social connections:    Talks on phone: Not on file    Gets together: Not on file    Attends religious service: Not on file    Active member of club or organization: Not on file    Attends meetings of clubs or organizations: Not on file    Relationship status: Not on file  . Intimate partner violence:    Fear of current or ex partner: Not on file    Emotionally abused: Not on file    Physically abused: Not on file    Forced sexual activity: Not on file  Other Topics Concern  . Not on file  Social History Narrative  . Not on file     PHYSICAL EXAM  Vitals:   06/11/18 1022  BP: 123/68  Pulse: 72  Weight: 153 lb 6.4 oz (69.6 kg)  Height: 5' 6.5" (1.689 m)   Body mass index is 24.39 kg/m.  Generalized: Well developed, in no acute distress  Head: normocephalic and atraumatic,. Oropharynx benign  Neck: Supple, no carotid bruits  Cardiac: Regular rate rhythm, no murmur  Skin no rash or petechiae Musculoskeletal: No deformity   Neurological examination   Mentation: Alert oriented to time, place, history taking. Attention span and concentration appropriate. Recent and remote memory intact.  Follows all commands speech and language fluent.   Cranial nerve II-XII: Pupils were equal round reactive to light extraocular movements were full, visual field were full on confrontational test. Facial sensation and strength were normal. hearing was intact to finger rubbing bilaterally. Uvula tongue midline. head turning and shoulder shrug were normal and symmetric.Tongue protrusion into cheek strength was normal. Motor: normal bulk and tone, full strength in Cynthia BUE, BLE,  Sensory: normal and symmetric to light touch, pinprick, and  Vibration, in Cynthia upper and lower extremities Coordination:  finger-nose-finger, heel-to-shin bilaterally, no dysmetria Reflexes: 1+ upper lower and symmetric, plantar responses were flexor  bilaterally. Gait and Station: Rising up from seated position without assistance, normal stance,  moderate stride, good arm swing, smooth turning, able to perform tiptoe, and heel walking without difficulty. Tandem gait is steady.  No assistive device  DIAGNOSTIC DATA (LABS, IMAGING, TESTING) - I reviewed [name] records, labs, notes, testing and imaging myself where available.  Lab Results  Component Value Date   WBC 7.5 06/26/2017   HGB 12.5 06/26/2017   HCT 36.5 06/26/2017   MCV 89.9 06/26/2017   PLT 321 06/26/2017      Component Value Date/Time   NA 143 06/26/2017 0855   K 4.3 06/26/2017 0855   CL 107 06/26/2017 0855   CO2 28 06/26/2017 0855   GLUCOSE 88 06/26/2017 0855   BUN 23 06/26/2017 0855   CREATININE 0.83 06/26/2017 0855   CALCIUM 9.4 06/26/2017 0855   PROT 6.4 06/26/2017 0855   ALBUMIN 3.4 (L) 03/25/2016 0528   AST 19 06/26/2017 0855   ALT 14 06/26/2017 0855   ALKPHOS 80 03/25/2016 0528   BILITOT 0.7 06/26/2017 0855   GFRNONAA 72 06/26/2017 0855   GFRAA 83 06/26/2017 0855   Lab Results  Component Value Date   CHOL 188 06/26/2017   HDL 78 06/26/2017   LDLCALC 89 06/26/2017   TRIG 114 06/26/2017   CHOLHDL 2.4 06/26/2017   Lab Results  Component Value Date   HGBA1C 5.3 03/22/2016      ASSESSMENT AND PLAN 21 year Caucasian lady with parenchymal right frontal intracerebral hemorrhage in July 2017 of indeterminate etiology. She had  complaints of left-sided numbness and tingling from Cynthia shoulder to Cynthia hand, no weakness was involved thought to be partial seizures EEG was ordered showing focal slowing on Cynthia right side which is to be expected given her previous intracerebral hemorrhage. She was started on Keppra XR 500 daily without further episodes.  She reports increased anxiety today and fatigue.  She lives with an alcoholic  husband.Cynthia [name] is a current [name] of Dr. Leonie Man   who is out of Cynthia office today . This note is sent to Cynthia work in doctor.     PLAN: Continue aspirin 81 daily  Continue to monitor blood pressure at home reading in Cynthia office today 123/68 continue blood pressure medications Try to  get 30 minutes of exercise daily this will help your anxiety as well as your fatigue Healthy diet, whole grains fresh fruits and vegetables, lean meats Keppra XR 500mg  daily will refill Call for seizure activity F/U in 1 year Greater than 50% of time during this 20 minute visit was spent on counseling,explanation of diagnosis, planning of further management, discussion with [name]  and coordination of care for stroke prevention,seizure disorder, discussing seizure precautions, anxiety etc.  Dennie Bible, [facility], [facility], APRN  [facility] [address], [place], [place] [address] [phone]

## 2018-06-07 ENCOUNTER — Ambulatory Visit: Payer: Medicare Other | Admitting: Family Medicine

## 2018-06-11 ENCOUNTER — Encounter: Payer: Self-pay | Admitting: Nurse Practitioner

## 2018-06-11 ENCOUNTER — Ambulatory Visit: Payer: Medicare Other | Admitting: Nurse Practitioner

## 2018-06-11 VITALS — BP 123/68 | HR 72 | Ht 66.5 in | Wt 153.4 lb

## 2018-06-11 DIAGNOSIS — R569 Unspecified convulsions: Secondary | ICD-10-CM

## 2018-06-11 DIAGNOSIS — I10 Essential (primary) hypertension: Secondary | ICD-10-CM

## 2018-06-11 DIAGNOSIS — I611 Nontraumatic intracerebral hemorrhage in hemisphere, cortical: Secondary | ICD-10-CM

## 2018-06-11 DIAGNOSIS — G40209 Localization-related (focal) (partial) symptomatic epilepsy and epileptic syndromes with complex partial seizures, not intractable, without status epilepticus: Secondary | ICD-10-CM

## 2018-06-11 MED ORDER — LEVETIRACETAM ER 500 MG PO TB24: 500.0000 mg | ORAL_TABLET | Freq: Every day | ORAL | 3 refills | Status: DC

## 2018-06-11 NOTE — Patient Instructions (Signed)
Continue aspirin 81 daily  Continue to monitor blood pressure at home reading in the office today 123/68 continue blood pressure medications Try to  get 30 minutes of exercise daily Healthy diet, whole grains fresh fruits and vegetables, lean meats Keppra XR 500mg  daily will refill Call for seizure activity F/U in 1 year

## 2018-06-11 NOTE — Progress Notes (Signed)
Made any corrections needed, and agree with history, physical, neuro exam,assessment and plan as stated above.     Antonia Ahern, MD [facility] 

## 2018-06-14 ENCOUNTER — Other Ambulatory Visit: Payer: Self-pay | Admitting: Family Medicine

## 2018-06-14 NOTE — Telephone Encounter (Signed)
Ok to refill??  Last office visit 06/30/2017.  Last refill 04/19/2018.

## 2018-06-19 ENCOUNTER — Other Ambulatory Visit: Payer: Self-pay | Admitting: Family Medicine

## 2018-06-25 LAB — HM MAMMOGRAPHY

## 2018-06-27 ENCOUNTER — Encounter: Payer: Self-pay | Admitting: *Deleted

## 2018-07-29 ENCOUNTER — Other Ambulatory Visit: Payer: Self-pay | Admitting: Family Medicine

## 2018-07-30 NOTE — Telephone Encounter (Signed)
Ok to refill??  Last office visit 06/30/2017.  Last refill 06/15/2018.

## 2018-09-07 ENCOUNTER — Ambulatory Visit (INDEPENDENT_AMBULATORY_CARE_PROVIDER_SITE_OTHER): Payer: Medicare Other | Admitting: Family Medicine

## 2018-09-07 ENCOUNTER — Encounter: Payer: Self-pay | Admitting: Family Medicine

## 2018-09-07 VITALS — HR 92 | Temp 97.8°F | Resp 16 | Ht 66.5 in | Wt 154.0 lb

## 2018-09-07 DIAGNOSIS — R8290 Unspecified abnormal findings in urine: Secondary | ICD-10-CM

## 2018-09-07 DIAGNOSIS — N3090 Cystitis, unspecified without hematuria: Secondary | ICD-10-CM

## 2018-09-07 DIAGNOSIS — R300 Dysuria: Secondary | ICD-10-CM | POA: Diagnosis not present

## 2018-09-07 LAB — URINALYSIS, ROUTINE W REFLEX MICROSCOPIC
Bilirubin Urine: NEGATIVE
Glucose, UA: NEGATIVE
Ketones, ur: NEGATIVE
Nitrite: POSITIVE — AB
Specific Gravity, Urine: 1.025 (ref 1.001–1.03)
pH: 6 (ref 5.0–8.0)

## 2018-09-07 LAB — MICROSCOPIC MESSAGE

## 2018-09-07 MED ORDER — SULFAMETHOXAZOLE-TRIMETHOPRIM 800-160 MG PO TABS: 1.0000 | ORAL_TABLET | Freq: Two times a day (BID) | ORAL | 0 refills | Status: DC

## 2018-09-07 NOTE — Addendum Note (Signed)
Addended by: Shary Decamp B on: 09/07/2018 04:27 PM   Modules accepted: Orders

## 2018-09-07 NOTE — Progress Notes (Signed)
Subjective:    Patient ID: Cynthia [name], female    DOB: May 13, 1946, 72 y.o.   MRN: [id number]  HPI Symptoms began towards the end of November.  Symptoms include increased urinary frequency, increased urinary urgency, however she is also experiencing hesitancy and urinary retention.  She reports dysuria.  She denies any fevers or chills but she has developed some mild mid back pain.  She has no CVA tenderness.  She was trying to self treat this with cranberry juice.  Symptoms would wax and wane.  However 1 week ago symptoms intensified and have worsened to the point that she called made the appointment today Past Medical History:  Diagnosis Date  . Colon polyps   . Depression    history of  . Osteoporosis   . Stroke [facility])    Past Surgical History:  Procedure Laterality Date  . IR GENERIC HISTORICAL  08/05/2016   IR ANGIO VERTEBRAL SEL VERTEBRAL UNI R MOD SED 08/05/2016 Lu[facility] Bras, MD [facility]  . IR GENERIC HISTORICAL  08/05/2016   IR ANGIO INTRA EXTRACRAN SEL COM CAROTID INNOMINATE BILAT MOD SED 08/05/2016 Luanne Bras, MD [facility]  . IR GENERIC HISTORICAL  08/05/2016   IR ANGIO VERTEBRAL SEL SUBCLAVIAN INNOMINATE UNI L MOD SED 08/05/2016 Luanne Bras, MD [facility]   Current Outpatient Medications on File Prior to Visit  Medication Sig Dispense Refill  . alendronate (FOSAMAX) 70 MG tablet TAKE 1 TABLET BY MOUTH EVERY 7 DAYS. TAKE WITH FULL GLASS OF WATER ON AN EMPTY STOMACH 12 tablet 3  . ALPRAZolam (XANAX) 0.25 MG tablet TAKE 1 TABLET BY MOUTH THREE TIMES A DAY AS NEEDED FOR ANXIETY 90 tablet 0  . aspirin EC 81 MG tablet Take 81 mg by mouth daily.    . Calcium Carbonate-Vitamin D3 (CALCIUM 600-D) 600-400 MG-UNIT TABS Take 1 tablet by mouth 2 (two) times daily.    . cetirizine (ZYRTEC) 10 MG tablet Take 10 mg by mouth daily.     Marland Kitchen EPIPEN 2-PAK 0.3 MG/0.3ML SOAJ injection USE AS DIRECTED 2 Device 1  . famotidine (PEPCID) 20 MG tablet Take 1 tablet  (20 mg total) by mouth 2 (two) times daily. 180 tablet 2  . ferrous sulfate 325 (65 FE) MG tablet Take 325 mg by mouth daily.     Marland Kitchen levETIRAcetam (KEPPRA XR) 500 MG 24 hr tablet Take 1 tablet (500 mg total) by mouth daily. 90 tablet 3  . loratadine (CLARITIN) 10 MG tablet Take 10 mg by mouth daily.    Marland Kitchen losartan (COZAAR) 100 MG tablet TAKE 1 TABLET BY MOUTH EVERY DAY 90 tablet 3   No current facility-administered medications on file prior to visit.    Allergies  Allergen Reactions  . Other Anaphylaxis    FIRE ANTS Insect stings / bites  . Acyclovir And Related   . Codeine     Syncope   . Penicillins Rash    Has patient had a PCN reaction causing immediate rash, facial/tongue/throat swelling, SOB or lightheadedness with hypotension: Unknown Has patient had a PCN reaction causing severe rash involving mucus membranes or skin necrosis: No Has patient had a PCN reaction that required hospitalization No Has patient had a PCN reaction occurring within the last 10 years: No If all of the above answers are "NO", then may proceed with Cephalosporin use.    Social History   Socioeconomic History  . Marital status: Married    Spouse name: Not on file  . Number of children:  3  . Years of education: Not on file  . Highest education level: Not on file  Occupational History    Comment: teacher, retired  Scientific laboratory technician  . Financial resource strain: Not on file  . Food insecurity:    Worry: Not on file    Inability: Not on file  . Transportation needs:    Medical: Not on file    Non-medical: Not on file  Tobacco Use  . Smoking status: Never Smoker  . Smokeless tobacco: Never Used  Substance and Sexual Activity  . Alcohol use: Yes    Alcohol/week: 1.0 standard drinks    Types: 1 Glasses of wine per week    Comment: 1-2 beers/wine in evening  . Drug use: No  . Sexual activity: Not on file    Comment: Married since 2005  Lifestyle  . Physical activity:    Days per week: Not on file      Minutes per session: Not on file  . Stress: Not on file  Relationships  . Social connections:    Talks on phone: Not on file    Gets together: Not on file    Attends religious service: Not on file    Active member of club or organization: Not on file    Attends meetings of clubs or organizations: Not on file    Relationship status: Not on file  . Intimate partner violence:    Fear of current or ex partner: Not on file    Emotionally abused: Not on file    Physically abused: Not on file    Forced sexual activity: Not on file  Other Topics Concern  . Not on file  Social History Narrative  . Not on file      Review of Systems  All other systems reviewed and are negative.      Objective:   Physical Exam  Constitutional: She appears well-developed and well-nourished.  Cardiovascular: Normal rate, regular rhythm and normal heart sounds.  Pulmonary/Chest: Effort normal and breath sounds normal. No respiratory distress. She has no wheezes. She has no rales.  Abdominal: Soft. Bowel sounds are normal. She exhibits no distension. There is no abdominal tenderness. There is no rebound and no guarding.  Musculoskeletal:        General: No edema.  Vitals reviewed.         Assessment & Plan:  Abnormal urine odor - Plan: Urinalysis, Routine w reflex microscopic  Burning with urination - Plan: Urinalysis, Routine w reflex microscopic  Urinalysis suggests urinary tract infection.  I will treat the patient with Bactrim double strength tablets 1 p.o. twice daily for 5 days recheck next week if no better.  Please send urine culture

## 2018-09-10 ENCOUNTER — Other Ambulatory Visit: Payer: Self-pay | Admitting: Family Medicine

## 2018-09-10 LAB — URINE CULTURE
MICRO NUMBER: 91545008
SPECIMEN QUALITY: ADEQUATE

## 2018-09-10 NOTE — Telephone Encounter (Signed)
Pt is requesting refill on Xanax   LOV: 09/07/18  LRF:   07/30/18

## 2018-09-20 ENCOUNTER — Other Ambulatory Visit: Payer: Self-pay | Admitting: Family Medicine

## 2018-10-25 ENCOUNTER — Other Ambulatory Visit: Payer: Self-pay | Admitting: Family Medicine

## 2018-10-26 NOTE — Telephone Encounter (Signed)
Pt is requesting refill on Xanax   LOV: 09/07/18  LRF:   09/11/18

## 2018-11-24 ENCOUNTER — Other Ambulatory Visit: Payer: Self-pay | Admitting: Family Medicine

## 2018-11-26 NOTE — Telephone Encounter (Signed)
Ok to refill??  Last office visit 09/07/2018.  Last refill 10/26/2018.

## 2018-12-15 ENCOUNTER — Other Ambulatory Visit: Payer: Self-pay | Admitting: Family Medicine

## 2019-01-19 ENCOUNTER — Other Ambulatory Visit: Payer: Self-pay | Admitting: Family Medicine

## 2019-01-21 NOTE — Telephone Encounter (Signed)
Pt is requesting refill on Xanax   LOV: 09/07/18  LRF:   11/26/18

## 2019-03-10 ENCOUNTER — Other Ambulatory Visit: Payer: Self-pay | Admitting: Family Medicine

## 2019-03-11 ENCOUNTER — Other Ambulatory Visit: Payer: Self-pay | Admitting: Family Medicine

## 2019-03-11 MED ORDER — ALPRAZOLAM 0.5 MG PO TABS: ORAL_TABLET | ORAL | 0 refills | Status: DC

## 2019-03-11 NOTE — Telephone Encounter (Signed)
Patient needs a refill on her xanax not the famotidine that the pharmacy sent the request in. She uses [facility] in [place].   CB# [phone]

## 2019-03-11 NOTE — Telephone Encounter (Signed)
Needs OV with Dr. Dennard Schaumann

## 2019-03-11 NOTE — Telephone Encounter (Signed)
Pt is requesting refill on Xanax   LOV: 09/07/18  LRF:  01/21/19

## 2019-03-12 NOTE — Telephone Encounter (Signed)
Letter mailed to pt to schedule apt.

## 2019-03-21 ENCOUNTER — Other Ambulatory Visit: Payer: Medicare Other

## 2019-03-21 ENCOUNTER — Other Ambulatory Visit: Payer: Self-pay

## 2019-03-22 ENCOUNTER — Other Ambulatory Visit: Payer: Medicare Other

## 2019-03-23 LAB — LIPID PANEL
Cholesterol: 186 mg/dL (ref <200)
HDL: 73 mg/dL (ref >=50)
LDL Cholesterol (Calc): 97 mg/dL (calc)
Non-HDL Cholesterol (Calc): 113 mg/dL (calc) (ref <130)
Total CHOL/HDL Ratio: 2.5 (calc) (ref <5.0)
Triglycerides: 72 mg/dL (ref <150)

## 2019-03-23 LAB — COMPREHENSIVE METABOLIC PANEL
AG Ratio: 2.2 (calc) (ref 1.0–2.5)
ALT: 17 U/L (ref 6–29)
AST: 22 U/L (ref 10–35)
Albumin: 4.2 g/dL (ref 3.6–5.1)
Alkaline phosphatase (APISO): 50 U/L (ref 37–153)
BUN: 22 mg/dL (ref 7–25)
CO2: 31 mmol/L (ref 20–32)
Calcium: 9.5 mg/dL (ref 8.6–10.4)
Chloride: 106 mmol/L (ref 98–110)
Creat: 0.84 mg/dL (ref 0.60–0.93)
Globulin: 1.9 g/dL (calc) (ref 1.9–3.7)
Glucose, Bld: 82 mg/dL (ref 65–99)
Potassium: 4.4 mmol/L (ref 3.5–5.3)
Sodium: 141 mmol/L (ref 135–146)
Total Bilirubin: 0.6 mg/dL (ref 0.2–1.2)
Total Protein: 6.1 g/dL (ref 6.1–8.1)

## 2019-03-23 LAB — CBC WITH DIFFERENTIAL/PLATELET
Absolute Monocytes: 451 cells/uL (ref 200–950)
Basophils Absolute: 28 cells/uL (ref 0–200)
Basophils Relative: 0.5 %
Eosinophils Absolute: 50 cells/uL (ref 15–500)
Eosinophils Relative: 0.9 %
HCT: 35.5 % (ref 35.0–45.0)
Hemoglobin: 12.2 g/dL (ref 11.7–15.5)
Lymphs Abs: 2195 cells/uL (ref 850–3900)
MCH: 31.4 pg (ref 27.0–33.0)
MCHC: 34.4 g/dL (ref 32.0–36.0)
MCV: 91.3 fL (ref 80.0–100.0)
MPV: 9.5 fL (ref 7.5–12.5)
Monocytes Relative: 8.2 %
Neutro Abs: 2778 cells/uL (ref 1500–7800)
Neutrophils Relative %: 50.5 %
Platelets: 318 10*3/uL (ref 140–400)
RBC: 3.89 10*6/uL (ref 3.80–5.10)
RDW: 13 % (ref 11.0–15.0)
Total Lymphocyte: 39.9 %
WBC: 5.5 10*3/uL (ref 3.8–10.8)

## 2019-03-29 ENCOUNTER — Ambulatory Visit (INDEPENDENT_AMBULATORY_CARE_PROVIDER_SITE_OTHER): Payer: Medicare Other | Admitting: Family Medicine

## 2019-03-29 ENCOUNTER — Other Ambulatory Visit: Payer: Self-pay

## 2019-03-29 ENCOUNTER — Encounter: Payer: Self-pay | Admitting: Family Medicine

## 2019-03-29 VITALS — BP 110/72 | HR 78 | Temp 98.8°F | Resp 18 | Ht 66.5 in | Wt 154.0 lb

## 2019-03-29 DIAGNOSIS — R5382 Chronic fatigue, unspecified: Secondary | ICD-10-CM | POA: Diagnosis not present

## 2019-03-29 DIAGNOSIS — I10 Essential (primary) hypertension: Secondary | ICD-10-CM | POA: Diagnosis not present

## 2019-03-29 DIAGNOSIS — Z8673 Personal history of transient ischemic attack (TIA), and cerebral infarction without residual deficits: Secondary | ICD-10-CM | POA: Diagnosis not present

## 2019-03-29 DIAGNOSIS — M810 Age-related osteoporosis without current pathological fracture: Secondary | ICD-10-CM

## 2019-03-29 LAB — TSH: TSH: 2.42 mIU/L (ref 0.40–4.50)

## 2019-03-29 LAB — VITAMIN B12: Vitamin B-12: 497 pg/mL (ref 200–1100)

## 2019-03-29 NOTE — Progress Notes (Signed)
Subjective:    Patient ID: Cynthia [name], female    DOB: 08-05-1946, 73 y.o.   MRN: [id number]  HPI  04/14/16 Patient was admitted to the hospital July 9 with difficulty finding words and neglect of her left leg and some left-sided weakness. CT scan revealed a large right frontal intracranial hemorrhage. I have copied relevant portions of the discharge summary and included them below for my reference: Admit date: 03/20/2016 Discharge date: 03/24/2016  Admission Diagnoses: Headache and word finding difficulty  Discharge Diagnoses: Right frontal lobar intracerebral hemorrhage of indeterminate etiology. Mild left hemiparesis and speech difficulties Active Problems:   Intracerebral hemorrhage   ICH (intracerebral hemorrhage) (HCC)   Vascular headache   Benign essential HTN   Hyponatremia   Acute blood loss anemia   TBI (traumatic brain injury) ([place]) Mild cytotoxic edema  Discharged Condition: good  Hospital Course: 73 year old right-handed woman who presented to the [facility] Emergency department after being sent from her doctor's office due to concern for possible stroke.The patient  reported that the night of 03/19/2016 (LKW, time unknown) she developed a sense of feeling disoriented and having difficulty speaking. When asked about her speech, she described that she had trouble focusing her thoughts and getting her words out. She reported that her sister also noticed that she seemed slow to respond to questions. She felt as if her speech was somewhat slurred as well. She did not endorse any weakness, numbness, vision changes, difficulty swallowing, or balance impairments. However, she had noticed that she will find her left arm in unusual postures and positions without being aware that it is doing anything. When she looked at it, she was able to control it without a problem and again denied any weakness or sensory changes. She  also had a headache for the past 2 days.  She denies any  similar previous episodes. She denied any recent trauma to the head. She did note that she was working with her brother at her home and had been up on a ladder. She was getting off the ladder, she thought she was on the ground but instead was on the second step of the latter when she stumbled backwards. She says that she struck her bottom on a nearby chair, then fell to the floor landing on her bottom. She did not strike her head. She has not had any recent illness. She does not use daily antiplatelet therapy or anticoagulation. She uses occasional Advil for headache and pain and has taken a total of six 200 mg tablets since last night. She also reports that she took a baby aspirin earlier today as recommended by her sister because of her symptoms.CT brain on admission showed a large right frontal ICH. Patient was not administered IV t-PA secondary to [place]. She was admitted to the neuro ICU for further evaluation and treatment. Her blood pressure was tightly controlled initially with IV drip and subsequently with oral medications. Follow-up CT scan showed stable appearance of the hemorrhage. There was mild cytotoxic edema but no hydrocephalus or any hemorrhage expansion. CT angiogram and CT venogram did not show any aneurysms or venous sinus thrombosis but there are no engorged vessels in the region of the hemorrhage raising concern about a small AVM hence cerebral catheter angiogram was performed and findings of which are also indeterminate and showed abnormal right frontopolar branches .She had persistent left-sided weakness but her speech and word finding difficulties improved. She was transferred to the neurology floor in a condition remained stable.  She was seen by physical occupational therapist and felt to be a good candidate for inpatient rehabilitation. She was accepted for transfer to rehabilitation in stable condition.   Consults: rehabilitation medicine and ointerventional radiology  Significant  Diagnostic Studies:   CT R frontal lobe ICH, 20 mL, possible trace SDH  MRI R posterior frontal lobe ICH, 7-22 days old. No other blood products seen.  CTA head and neck No avm/aneurysm  CT venogram no sinus thrombosis  Cerebral angio No definite AVM or aneurysm noted but abnormal right frontopolar branches raises the question of possible small AVM  LDL 84  HgbA1c  5.3  She is here today for follow-up. She is doing remarkably well. She is walking without difficulty. She is in physical therapy. At the present time she is having no difficulty speaking. She denies any difficulty with short-term or long-term memory. She denies any personality changes. Her biggest concern is what caused this and how to prevent it in the future. She was still taking meloxicam. She is on no other agent that within her blood. Her blood pressure today was extremely elevated on intake. However after letting the patient sit comfortably for a few minutes, her blood pressure fell to 148/90.  AT that time, my plan was: At the time of her initial stroke, angiogram revealed no specific cause for the intracranial hemorrhage. Neurology has recommended repeating the test in 2 months. Patient is scheduled to follow with a neurologist in one month. In the meantime I want her to avoid any agents that can increase her risk of bleeding. I recommended that she stay away from meloxicam. She can use Tylenol if necessary for pain. I recommended avoiding I loosely aspirin or any other agent such as fish oil. Mother primary concern is her blood pressure. Even after calming down, her blood pressure is elevated today. She will check her blood pressure frequently over the weekend and call me with the values on Monday. Her goal blood pressures less than 130/80. If above this, I will start the patient on an angiotensin receptor blocker.  05/05/16 Patient is here today for follow-up. Ultimately started losartan 50 mg by mouth daily. Lead pressure at  home has been averaging between 115 and 130/70-80. She is tolerating the medication well with no side effects.  At that time, my plan was: Blood pressure is much better controlled. Continue losartan 50 mg by mouth daily. Recheck in 6 months   09/22/16 Patient presents with a very complicated problem. She has a history of intracranial hemorrhage. Follow-up MRI in November revealed no arteriovenous malformations or aneurysms. There were signs of chronic microvascular ischemia although there were mild. She has had numerous MRAs performed which revealed no significant extracranial or intracranial stenosis. Her cholesterol is outstanding. Her only risk factor would be elevated blood pressures. In August she had a transient episode where her left leg distal to her knee became numb. At the exact same time the left side of her face became numb. Symptoms lasted several minutes and resolve spontaneously. In the last 2 weeks she's had 2 separate incidents where her left arm from her shoulder to her hand became numb and weak. At the exact same time the left side of her face also became numb. This occurred only in one event. Other event although the arm was affected. In each of those events the symptoms last a proximally 10 minutes. There is no speech disturbance or facial asymmetry but symptoms are concerning for TIA. The unusual feature is  that there are no risk factors for TIA.  At that time, my plan was: Patient has had a thorough diagnostic workup. At this point symptoms are consistent with a TIA. The decision is whether the risk of recurrent intracranial hemorrhage on antiplatelet therapy outweighs the risk of primary prevention of CVA secondary to TIA on antiplatelet therapy. After having a long discussion with the patient she agrees to start taking an aspirin 81 mg daily. His been more than 6 months since her stroke and therefore risk of extension of the previous hemorrhagic cva is not a consideration. I will  increase her losartan 100 mg a day. Her home blood pressures are typically 130 to 140/80-90. I will also set her up to see her neurologist for a second opinion as to whether we should do to manage this  03/29/19 Patient saw neurology who felt the numbness in the left side of her face left arm and left leg represented complex partial seizures.  They started the patient on Keppra and since that time the episodes of numbness have subsided.  Overall she is doing well however she does complain of fatigue.  Her blood pressure is outstanding at home.  The majority of her systolic blood pressures are in the 120s.  Her diastolic blood pressures are always well controlled in the 70s.  She will have the occasional low blood pressure in the occasional high blood pressure but the vast majority are 120-130/60-70.  Her most recent lab work is listed below: Lab on 03/21/2019  Component Date Value Ref Range Status  . Glucose, Bld 03/21/2019 82  65 - 99 mg/dL Final   Comment: .            Fasting reference interval .   . BUN 03/21/2019 22  7 - 25 mg/dL Final  . Creat 03/21/2019 0.84  0.60 - 0.93 mg/dL Final   Comment: For patients >58 years of age, the reference limit for Creatinine is approximately 13% higher for people identified as African-American. .   Havery Moros Ratio 78/29/5621 NOT APPLICABLE  6 - 22 (calc) Final  . Sodium 03/21/2019 141  135 - 146 mmol/L Final  . Potassium 03/21/2019 4.4  3.5 - 5.3 mmol/L Final  . Chloride 03/21/2019 106  98 - 110 mmol/L Final  . CO2 03/21/2019 31  20 - 32 mmol/L Final  . Calcium 03/21/2019 9.5  8.6 - 10.4 mg/dL Final  . Total Protein 03/21/2019 6.1  6.1 - 8.1 g/dL Final  . Albumin 03/21/2019 4.2  3.6 - 5.1 g/dL Final  . Globulin 03/21/2019 1.9  1.9 - 3.7 g/dL (calc) Final  . AG Ratio 03/21/2019 2.2  1.0 - 2.5 (calc) Final  . Total Bilirubin 03/21/2019 0.6  0.2 - 1.2 mg/dL Final  . Alkaline phosphatase (APISO) 03/21/2019 50  37 - 153 U/L Final  . AST  03/21/2019 22  10 - 35 U/L Final  . ALT 03/21/2019 17  6 - 29 U/L Final  . Cholesterol 03/21/2019 186  <200 mg/dL Final  . HDL 03/21/2019 73  > OR = 50 mg/dL Final  . Triglycerides 03/21/2019 72  <150 mg/dL Final  . LDL Cholesterol (Calc) 03/21/2019 97  mg/dL (calc) Final   Comment: Reference range: <100 . Desirable range <100 mg/dL for primary prevention;   <70 mg/dL for patients with CHD or diabetic patients  with > or = 2 CHD risk factors. Marland Kitchen LDL-C is now calculated using the Martin-Hopkins  calculation, which is a validated novel method  providing  better accuracy than the Friedewald equation in the  estimation of LDL-C.  Cresenciano Genre et al. Annamaria Helling. 6734;193(79): 2061-2068  (http://education.[employer].com/faq/FAQ164)   . Total CHOL/HDL Ratio 03/21/2019 2.5  <5.0 (calc) Final  . Non-HDL Cholesterol (Calc) 03/21/2019 113  <130 mg/dL (calc) Final   Comment: For patients with diabetes plus 1 major ASCVD risk  factor, treating to a non-HDL-C goal of <100 mg/dL  (LDL-C of <70 mg/dL) is considered a therapeutic  option.   . WBC 03/21/2019 5.5  3.8 - 10.8 Thousand/uL Final  . RBC 03/21/2019 3.89  3.80 - 5.10 Million/uL Final  . Hemoglobin 03/21/2019 12.2  11.7 - 15.5 g/dL Final  . HCT 03/21/2019 35.5  35.0 - 45.0 % Final  . MCV 03/21/2019 91.3  80.0 - 100.0 fL Final  . MCH 03/21/2019 31.4  27.0 - 33.0 pg Final  . MCHC 03/21/2019 34.4  32.0 - 36.0 g/dL Final  . RDW 03/21/2019 13.0  11.0 - 15.0 % Final  . Platelets 03/21/2019 318  140 - 400 Thousand/uL Final  . MPV 03/21/2019 9.5  7.5 - 12.5 fL Final  . Neutro Abs 03/21/2019 2,778  1,500 - 7,800 cells/uL Final  . Lymphs Abs 03/21/2019 2,195  850 - 3,900 cells/uL Final  . Absolute Monocytes 03/21/2019 451  200 - 950 cells/uL Final  . Eosinophils Absolute 03/21/2019 50  15 - 500 cells/uL Final  . Basophils Absolute 03/21/2019 28  0 - 200 cells/uL Final  . Neutrophils Relative % 03/21/2019 50.5  % Final  . Total Lymphocyte 03/21/2019  39.9  % Final  . Monocytes Relative 03/21/2019 8.2  % Final  . Eosinophils Relative 03/21/2019 0.9  % Final  . Basophils Relative 03/21/2019 0.5  % Final    Past Medical History:  Diagnosis Date  . Colon polyps   . Depression    history of  . Osteoporosis   . Stroke [facility])    Past Surgical History:  Procedure Laterality Date  . IR GENERIC HISTORICAL  08/05/2016   IR ANGIO VERTEBRAL SEL VERTEBRAL UNI R MOD SED 08/05/2016 Luanne Bras, MD [facility]INTER[facility]  . IR GENERIC HISTORICAL  08/05/2016   IR ANGIO INTRA EXTRACRAN SEL COM CAROTID INNOMINATE BILAT MOD SED 08/05/2016 Luanne Bras, MD [facility]  . IR GENERIC HISTORICAL  08/05/2016   IR ANGIO VERTEBRAL SEL SUBCLAVIAN INNOMINATE UNI L MOD SED 08/05/2016 Luanne Bras, MD [facility]   Current Outpatient Medications on File Prior to Visit  Medication Sig Dispense Refill  . alendronate (FOSAMAX) 70 MG tablet TAKE 1 TABLET BY MOUTH EVERY 7 DAYS. TAKE WITH FULL GLASS OF WATER ON AN EMPTY STOMACH 12 tablet 3  . ALPRAZolam (XANAX) 0.5 MG tablet TAKE 1/2 TABLET BY MOUTH 3 TIMES A DAY AS NEEDED FOR ANXIETY 45 tablet 0  . aspirin EC 81 MG tablet Take 81 mg by mouth daily.    . Calcium Carbonate-Vitamin D3 (CALCIUM 600-D) 600-400 MG-UNIT TABS Take 1 tablet by mouth 2 (two) times daily.    . cetirizine (ZYRTEC) 10 MG tablet Take 10 mg by mouth daily.     Marland Kitchen EPIPEN 2-PAK 0.3 MG/0.3ML SOAJ injection USE AS DIRECTED 2 Device 1  . famotidine (PEPCID) 20 MG tablet TAKE 1 TABLET BY MOUTH TWICE A DAY 180 tablet 2  . ferrous sulfate 325 (65 FE) MG tablet Take 325 mg by mouth daily.     Marland Kitchen levETIRAcetam (KEPPRA XR) 500 MG 24 hr tablet Take 1 tablet (500 mg total) by mouth  daily. 90 tablet 3  . loratadine (CLARITIN) 10 MG tablet Take 10 mg by mouth daily.    Marland Kitchen losartan (COZAAR) 100 MG tablet TAKE 1 TABLET BY MOUTH EVERY DAY 90 tablet 3  . zinc gluconate 50 MG tablet Take 50 mg by mouth daily.     No current facility-administered  medications on file prior to visit.    Allergies  Allergen Reactions  . Other Anaphylaxis    FIRE ANTS Insect stings / bites  . Acyclovir And Related   . Codeine     Syncope   . Penicillins Rash    Has patient had a PCN reaction causing immediate rash, facial/tongue/throat swelling, SOB or lightheadedness with hypotension: Unknown Has patient had a PCN reaction causing severe rash involving mucus membranes or skin necrosis: No Has patient had a PCN reaction that required hospitalization No Has patient had a PCN reaction occurring within the last 10 years: No If all of the above answers are "NO", then may proceed with Cephalosporin use.    Social History   Socioeconomic History  . Marital status: Married    Spouse name: Not on file  . Number of children: 3  . Years of education: Not on file  . Highest education level: Not on file  Occupational History    Comment: teacher, retired  Scientific laboratory technician  . Financial resource strain: Not on file  . Food insecurity    Worry: Not on file    Inability: Not on file  . Transportation needs    Medical: Not on file    Non-medical: Not on file  Tobacco Use  . Smoking status: Never Smoker  . Smokeless tobacco: Never Used  Substance and Sexual Activity  . Alcohol use: Yes    Alcohol/week: 1.0 standard drinks    Types: 1 Glasses of wine per week    Comment: 1-2 beers/wine in evening  . Drug use: No  . Sexual activity: Not on file    Comment: Married since 2005  Lifestyle  . Physical activity    Days per week: Not on file    Minutes per session: Not on file  . Stress: Not on file  Relationships  . Social Herbalist on phone: Not on file    Gets together: Not on file    Attends religious service: Not on file    Active member of club or organization: Not on file    Attends meetings of clubs or organizations: Not on file    Relationship status: Not on file  . Intimate partner violence    Fear of current or ex partner: Not  on file    Emotionally abused: Not on file    Physically abused: Not on file    Forced sexual activity: Not on file  Other Topics Concern  . Not on file  Social History Narrative  . Not on file     Review of Systems  All other systems reviewed and are negative.      Objective:   Physical Exam  Constitutional: She is oriented to person, place, and time.  Cardiovascular: Normal rate, regular rhythm and normal heart sounds.  Pulmonary/Chest: Effort normal and breath sounds normal. No respiratory distress. She has no wheezes. She has no rales.  Abdominal: Soft. Bowel sounds are normal. She exhibits no distension. There is no abdominal tenderness. There is no rebound.  Neurological: She is alert and oriented to person, place, and time. She has normal reflexes. No  cranial nerve deficit. She exhibits normal muscle tone. Coordination normal.  Psychiatric: She has a normal mood and affect. Her behavior is normal. Judgment and thought content normal.  Vitals reviewed.         Assessment & Plan:   1. Essential hypertension Her blood pressure is outstanding.  I will make no changes in her antihypertensive medication at this time.  Her lab work is excellent.  Her cholesterol is outstanding.  2. Osteoporosis, unspecified osteoporosis type, unspecified pathological fracture presence Patient's last bone density was December 2017.  She is due this year.  However due to the current coronavirus pandemic we have elected to wait till after the first of the year prior to repeating the bone density test.  3. History of stroke Patient has some mild word finding difficulties but otherwise is doing exceptionally well  4. Chronic fatigue I will add a TSH and a vitamin B12 level to her lab work.  I believe some of her fatigue could be stress related secondary to her husband's alcohol use. - TSH - Vitamin B12

## 2019-04-26 ENCOUNTER — Other Ambulatory Visit: Payer: Self-pay | Admitting: Family Medicine

## 2019-04-26 NOTE — Telephone Encounter (Signed)
Last office visit: 03/29/2019 Last refilled: 03/11/2019

## 2019-05-21 ENCOUNTER — Other Ambulatory Visit: Payer: Self-pay | Admitting: Family Medicine

## 2019-06-05 ENCOUNTER — Telehealth: Payer: Self-pay | Admitting: Family Medicine

## 2019-06-05 NOTE — Telephone Encounter (Signed)
Patient called states she has left msg on Sandy's vm.  She has a question about her keppra. She states today and a couple of days ago she had a small seizure in her left leg at first she thought it was a muscle cramp with tingling and numb sensation. She is questioning if she should have a higher dose of keppra she has an appointment with her neurologist on 06/17/2019.  CB#  [phone]

## 2019-06-06 NOTE — Telephone Encounter (Signed)
I would recommend that she call her neurologist since they are prescribing that medication and I do not want to interfere with her management however she could certainly take thousand milligrams of the Keppra extended release.  That would be my suggestion but I would like her to run it by her neurologist first

## 2019-06-07 ENCOUNTER — Telehealth: Payer: Self-pay | Admitting: Nurse Practitioner

## 2019-06-07 NOTE — Telephone Encounter (Signed)
Pt is asking for a call from RN before filling her Acetam (KEPPRA XR) 500 MG 24 hr tablet she has questions about a possible need to change the strength of the Keppra.  Please call

## 2019-06-07 NOTE — Telephone Encounter (Signed)
Patient aware of providers recommendations.  

## 2019-06-09 ENCOUNTER — Other Ambulatory Visit: Payer: Self-pay | Admitting: Family Medicine

## 2019-06-10 NOTE — Telephone Encounter (Signed)
Pt is requesting refill on Xanax   LOV: 03/29/2019  LRF:   04/29/19

## 2019-06-10 NOTE — Telephone Encounter (Signed)
If patient calls back see if Janett Billow NP has any available appts this week for pt. IF not pt can keep her appt with DR.Sethi on 06/17/2019 on Monday.  Left vm for patient she has an appt with Dr.SEthi on 06/17/2019 for a yearly appt. She was last seen 05/2018. Left on vm to refill her medication asap to avoid any seizure episodes.

## 2019-06-10 NOTE — Telephone Encounter (Signed)
Pt returned call, no appt avail for Cynthia [name], pt wants to know an update on her Keppra

## 2019-06-10 NOTE — Telephone Encounter (Signed)
I return pts call back about adjusting her keppra medications before her appt on 06/17/2019. Pt has enough meds to last her till Friday. She did not want to get a new 3 month rx on Friday if Dr.SEthi will change her dosage. PT reported on 06/05/2019 she had tingling and weakness on her left leg. She has not had a episode in 2 years since she had her  intracerebral hemorrhage stroke. She stated the episodes went away that day.I stated Dr Leonie Man is working in the hospital this week and a message will be sent to him.PT verbalized understanding.

## 2019-06-12 NOTE — Telephone Encounter (Signed)
No need to change dose prior to scheduled visit

## 2019-06-12 NOTE — Telephone Encounter (Signed)
I called pt to give him Dr Leonie Man recommendations. I stated no change in dose until he sees her on Monday for her follow up. PTs phone was going in and out with bad connection at times. Pt stated she will pick up a 30 day rx until she sees Dr.SEthi. PT verbalized understanding.

## 2019-06-17 ENCOUNTER — Other Ambulatory Visit: Payer: Self-pay

## 2019-06-17 ENCOUNTER — Encounter: Payer: Self-pay | Admitting: Neurology

## 2019-06-17 ENCOUNTER — Ambulatory Visit: Payer: Medicare Other | Admitting: Neurology

## 2019-06-17 VITALS — BP 122/69 | HR 72 | Temp 97.1°F | Wt 156.0 lb

## 2019-06-17 DIAGNOSIS — R569 Unspecified convulsions: Secondary | ICD-10-CM

## 2019-06-17 MED ORDER — LEVETIRACETAM ER 500 MG PO TB24: 500.0000 mg | ORAL_TABLET | Freq: Every day | ORAL | 3 refills | Status: DC

## 2019-06-17 NOTE — Progress Notes (Signed)
[facility]  PATIENT: Twylia Oka DOB: [birth year]   REASON FOR VISIT: Follow-up for complaints of numbness left side thought to be  partial seizures with history of intracerebral hemorrhage in July 2017 HISTORY FROM: Patient    HISTORY OF PRESENT ILLNESS:UPDATE 9/30/2019CM [name], 73 year old female returns for follow-up with history of intracranial hemorrhage in July 2017.  She is also had episodes suggestive of simple partial seizures none recently.  She is currently on Keppra 500 mg XR daily.  She is on aspirin for secondary stroke prevention without recurrent stroke or TIA symptoms.  She has no bruising and no bleeding.  Blood pressure well controlled in the office today at 123/68.  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  She has a lot of anxiety related to an alcoholic husband.  She has Xanax to take as needed.  She exercises maybe 3 times a week.  She has stopped her yoga class was encouraged to get back into that.  She complains of decreased energy level.  She denies any visual difficulty speech or swallowing problems balance issues or falls.  No interval medical issues.  She returns for reevaluation   UPDATE 09/27/2018CM [name], 73 year old female returns for follow-up with a history of intracranial hemorrhage in July 2017. She also has a history of episodes suggestive of simple partial seizures and was started on Keppra 500 mg daily. She has not had further episodes.She denies side effects to the medication. She remains on aspirin for secondary stroke prevention without recurrent stroke or TIA symptoms. She has minimal bruising and no bleeding.Blood pressure in the office today 129/71. She continues to teach part time. She denies any speech disturbance swallowing problems, visual difficulties, weakness, balance issues or falls. She returns for reevaluation  UPDATE 03/27/2018CM [name], 73 year old female returns for follow-up with a history  of intracranial hemorrhage July 2017. When last seen she was having episodes of numbness left side lasting 5-10 minutes. EEG was ordered showing focal slowing on the right side which is to be expected given her previous intracerebral hemorrhage. She had another episode of transient paresthesias starting in the left shoulder and spreading gradually down into the fingertips of the left hand over   a few minutes. These episodes are suggestive of simple partial seizure. She was started on Keppra after her EEG was performed , she is currently taking Keppra XR 500 mg daily even though the EEG was negative for seizures. She has had one similar episode since it lasted several minutes shortly after starting the [place]. She remains on aspirin secondary stroke prevention. She denies any weakness speech disturbance swallowing problems visual difficulties and falls or balance issues. 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, 73 year old female returns for follow-up with new complaints of numbness left side lasting 5-10 minutes. She has had 2 of these events in the last 2 months. She had hospital admission in July 2017 for intracranial hemorrhage. Follow-up angiogram in November revealed no AV malformation or aneurysms. Her blood pressure does fluctuate however is well controlled in the office today at 122/64. She has had episodes where her left arm from her shoulder to her hand became numb and tingling however she denied any weakness with this ,one time her face was involved there again numbness but no weakness. She denies any speech disturbance, swallowing problems visual difficulty, problems with balance or falls or other associated symptoms. She denies headache. Her aspirin 0.81 was  restarted by her primary care physician on 09/22/2016. 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 73 year old right-handed woman who presented to the [facility] Emergency department after being sent from her doctor's office due to concern for possible stroke.The patient reported that the night of 03/19/2016 (LKW, time unknown) she developed a sense of feeling disoriented and having difficulty speaking. When asked about her speech, she described that she had trouble focusing her thoughts and getting her words out. She reported that her sister also noticed that she seemed slow to respond to questions. She felt as if her speech was somewhat slurred as well. She did not endorse any weakness, numbness, vision changes, difficulty swallowing, or balance impairments. However, she had noticed that she will find her left arm in unusual postures and positions without being aware that it is doing anything. When she looked at it, she was able to control it without a problem and again denied any weakness or sensory changes. She also had a headache for the past 2 days. She denies any similar previous episodes. She denied any recent trauma to the head. She did note that she was working with her brother at her home and had been up on a ladder. She was getting off the ladder, she thought she was on the ground but instead was on the second step of the latter when she stumbled backwards. She says that she struck her bottom on a nearby chair, then fell to the floor landing on her bottom. She did not strike her head. She has not had any recent illness. She does not use daily antiplatelet therapy or anticoagulation. She uses occasional Advil for headache and pain and has taken a total of six 200 mg tablets since last night. She also reports that she took a baby aspirin earlier today as recommended by her sister because of her symptoms.CT brain on admission showed a large right frontal ICH. Patient was not administered IV t-PA  secondary to [place]. She was admitted to the neuro ICU for further evaluation and treatment. Her blood pressure was tightly controlled initially with IV drip and subsequently with oral medications. Follow-up CT scan showed stable appearance of the hemorrhage. There was mild cytotoxic edema but no hydrocephalus or any hemorrhage expansion. CT angiogram and CT venogram did not show any aneurysms or venous sinus thrombosis but there were engorged vessels in the region of the hemorrhage raising concern about a small AVM hence cerebral catheter angiogram was performed and findings of which are also indeterminate and showed abnormal right frontopolar branches .She had persistent left-sided weakness but her speech and word finding difficulties improved. She was transferred to the neurology floor in a condition remained stable. She was seen by physical occupational therapist and felt to be a good candidate for inpatient rehabilitation. She was accepted for transfer to rehabilitation in stable condition. She states she's done well since discharge. She has patient outpatient physical and occupational therapy. She is back to her baseline and has no complaints or deficits from her intracerebral hemorrhage. She does feel she gets tired easily and at times her left-sided balance and coordination may be off. She states her blood pressure is well controlled and today it is 137/87 in office. She is wondering if she needs follow-up imaging studies. She has no complaints  Update 06/17/2019 :  She returns for follow-up after last visit a year ago with Cecille Rubin, nurse practitioner.  She states she is doing well and blood pressure is well controlled.  She had one brief episode of tingling in her left calf a few days ago.  It stayed localized and did not progress up to involve the rest of the left leg or the upper extremity.  She states a few days ago she had somewhat similar minor episode as well.  She does complain of having cramps in  her legs and thinks it may be due to dehydration as she is not drinking enough fluids.  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  She is tolerating Keppra XR 500 mg once daily very well without any side effects.  No other new complaints. REVIEW OF SYSTEMS: Full 14 system review of systems performed and notable only for those listed, all others are neg:  Tingling, numbness, fatigue, leg cramps, stress ALLERGIES: Allergies  Allergen Reactions  . Other Anaphylaxis    FIRE ANTS Insect stings / bites  . Acyclovir And Related   . Codeine     Syncope   . Penicillins Rash    Has patient had a PCN reaction causing immediate rash, facial/tongue/throat swelling, SOB or lightheadedness with hypotension: Unknown Has patient had a PCN reaction causing severe rash involving mucus membranes or skin necrosis: No Has patient had a PCN reaction that required hospitalization No Has patient had a PCN reaction occurring within the last 10 years: No If all of the above answers are "NO", then may proceed with Cephalosporin use.     HOME MEDICATIONS: Outpatient Medications Prior to Visit  Medication Sig Dispense Refill  . alendronate (FOSAMAX) 70 MG tablet TAKE 1 TABLET BY MOUTH EVERY 7 DAYS. TAKE WITH FULL GLASS OF WATER ON AN EMPTY STOMACH 12 tablet 3  . ALPRAZolam (XANAX) 0.5 MG tablet TAKE 1/2 TABLET BY MOUTH 3 TIMES A DAY AS NEEDED FOR ANXIETY 45 tablet 0  . aspirin EC 81 MG tablet Take 81 mg by mouth daily.    . Calcium Carbonate-Vitamin D3 (CALCIUM 600-D) 600-400 MG-UNIT TABS Take 1 tablet by mouth 2 (two) times daily.    Marland Kitchen EPIPEN 2-PAK 0.3 MG/0.3ML SOAJ injection USE AS DIRECTED 2 Device 1  . famotidine (PEPCID) 20 MG tablet TAKE 1 TABLET BY MOUTH TWICE A DAY 180 tablet 2  . ferrous sulfate  325 (65 FE) MG tablet Take 325 mg by mouth daily.     Marland Kitchen loratadine (CLARITIN) 10 MG tablet Take 10 mg by mouth daily.    Marland Kitchen losartan (COZAAR) 100 MG tablet TAKE 1 TABLET BY MOUTH EVERY DAY 90 tablet 3  . zinc gluconate 50 MG tablet Take 50 mg by mouth daily.    Marland Kitchen levETIRAcetam (KEPPRA XR) 500 MG 24 hr tablet Take 1 tablet (500 mg total) by mouth daily. 90 tablet 3  . cetirizine (ZYRTEC) 10 MG tablet Take 10 mg by mouth daily.      No facility-administered medications prior to visit.     PAST MEDICAL HISTORY: Past Medical History:  Diagnosis Date  . Colon polyps   . Depression    history of  . Osteoporosis   . Seizures ([place])   . Stroke [facility])  PAST SURGICAL HISTORY: Past Surgical History:  Procedure Laterality Date  . IR GENERIC HISTORICAL  08/05/2016   IR ANGIO VERTEBRAL SEL VERTEBRAL UNI R MOD SED 08/05/2016 Luanne Bras, MD [facility]  . IR GENERIC HISTORICAL  08/05/2016   IR ANGIO INTRA EXTRACRAN SEL COM CAROTID INNOMINATE BILAT MOD SED 08/05/2016 Luanne Bras, MD [facility]  . IR GENERIC HISTORICAL  08/05/2016   IR ANGIO VERTEBRAL SEL SUBCLAVIAN INNOMINATE UNI L MOD SED 08/05/2016 Luanne Bras, MD [facility]    FAMILY HISTORY: Family History  Problem Relation Age of Onset  . Heart disease Mother   . Cancer Mother        lung  . Stroke Paternal Grandmother     SOCIAL HISTORY: Social History   Socioeconomic History  . Marital status: Married    Spouse name: Not on file  . Number of children: 3  . Years of education: Not on file  . Highest education level: Not on file  Occupational History    Comment: teacher, retired  Scientific laboratory technician  . Financial resource strain: Not on file  . Food insecurity    Worry: Not on file    Inability: Not on file  . Transportation needs    Medical: Not on file    Non-medical: Not on file  Tobacco Use  . Smoking status: Never Smoker  . Smokeless tobacco: Never Used  Substance and Sexual Activity  .  Alcohol use: Yes    Alcohol/week: 1.0 standard drinks    Types: 1 Glasses of wine per week    Comment: 1-2 beers/wine in evening  . Drug use: No  . Sexual activity: Not on file    Comment: Married since 2005  Lifestyle  . Physical activity    Days per week: Not on file    Minutes per session: Not on file  . Stress: Not on file  Relationships  . Social Herbalist on phone: Not on file    Gets together: Not on file    Attends religious service: Not on file    Active member of club or organization: Not on file    Attends meetings of clubs or organizations: Not on file    Relationship status: Not on file  . Intimate partner violence    Fear of current or ex partner: Not on file    Emotionally abused: Not on file    Physically abused: Not on file    Forced sexual activity: Not on file  Other Topics Concern  . Not on file  Social History Narrative  . Not on file     PHYSICAL EXAM  Vitals:   06/17/19 1024  BP: 122/69  Pulse: 72  Temp: (!) 97.1 F (36.2 C)  Weight: 70.8 kg   Body mass index is 24.8 kg/m.  Generalized: Pleasant elderly Caucasian lady n no acute distress  Head: normocephalic and atraumatic,.  Neck: Supple, no carotid bruits  Cardiac: Regular rate rhythm, no murmur  Skin no rash or petechiae Musculoskeletal: No deformity   Neurological examination   Mentation: Alert oriented to time, place, and person. Attention span and concentration appropriate. Recent and remote memory intact.  Follows all commands speech and language fluent.   Cranial nerve II-XII: Pupils were equal round reactive to light extraocular movements were full, visual field were full on confrontational test. Facial sensation and strength were normal. hearing was intact to finger rubbing bilaterally. Uvula tongue midline. head turning and shoulder shrug were normal  and symmetric.Tongue protrusion into cheek strength was normal. Motor: normal bulk and tone, full strength in the  BUE, BLE,  Sensory: normal and symmetric to light touch, pinprick, and  Vibration, in the upper and lower extremities Coordination: finger-nose-finger, heel-to-shin bilaterally, no dysmetria Reflexes: 1+ upper lower and symmetric, plantar responses were flexor bilaterally. Gait and Station: Rising up from seated position without assistance, normal stance,  moderate stride, good arm swing, smooth turning, able to perform tiptoe, and heel walking without difficulty. Tandem gait is steady.  No assistive device  DIAGNOSTIC DATA (LABS, IMAGING, TESTING) - I reviewed patient records, labs, notes, testing and imaging myself where available.  Lab Results  Component Value Date   WBC 5.5 03/21/2019   HGB 12.2 03/21/2019   HCT 35.5 03/21/2019   MCV 91.3 03/21/2019   PLT 318 03/21/2019      Component Value Date/Time   NA 141 03/21/2019 0818   K 4.4 03/21/2019 0818   CL 106 03/21/2019 0818   CO2 31 03/21/2019 0818   GLUCOSE 82 03/21/2019 0818   BUN 22 03/21/2019 0818   CREATININE 0.84 03/21/2019 0818   CALCIUM 9.5 03/21/2019 0818   PROT 6.1 03/21/2019 0818   ALBUMIN 3.4 (L) 03/25/2016 0528   AST 22 03/21/2019 0818   ALT 17 03/21/2019 0818   ALKPHOS 80 03/25/2016 0528   BILITOT 0.6 03/21/2019 0818   GFRNONAA 72 06/26/2017 0855   GFRAA 83 06/26/2017 0855   Lab Results  Component Value Date   CHOL 186 03/21/2019   HDL 73 03/21/2019   LDLCALC 97 03/21/2019   TRIG 72 03/21/2019   CHOLHDL 2.5 03/21/2019   Lab Results  Component Value Date   HGBA1C 5.3 03/22/2016      ASSESSMENT AND PLAN 70 year Caucasian lady with parenchymal right frontal intracerebral hemorrhage in July 2017 of indeterminate etiology. She had  complaints of left-sided numbness and tingling from the shoulder to the hand, no weakness was involved thought to be partial seizures .  She is doing well.     PLAN:I had a long discussion with the patient regarding her remote history of intracerebral hemorrhage as well as  seizures and she appears to be stable from both standpoint.  Continue aspirin daily and  to maintain strict control of hypertension with blood pressure goal below 130/90.  Continue Keppra XR 500 mg daily and the current dose and check EEG to rule out any silent seizures or electrical irritability.  She will return for follow-up in the future in a year or call earlier if necessary Greater than 50% of time during this 25 minute visit was spent on counseling,explanation of diagnosis of simple partial seizure, intracerebral hemorrhage, planning of further management, discussion with patient  and coordination of care for stroke prevention,seizure disorder, discussing seizure precautions, anxiety etc.   Antony Contras, MD  [facility] [address] [place], [place] [address] [phone]

## 2019-06-17 NOTE — Patient Instructions (Signed)
I had a long discussion with the patient regarding her remote history of intracerebral hemorrhage as well as seizures and she appears to be stable from both standpoint.  Continue to maintain strict control of hypertension with blood pressure goal below 130/90.  Continue Keppra XR 500 mg daily and the current dose and check EEG to rule out any silent seizures or electrical irritability.  She will return for follow-up in the future in a year or call earlier if necessary

## 2019-07-01 ENCOUNTER — Other Ambulatory Visit: Payer: Self-pay

## 2019-07-01 ENCOUNTER — Encounter: Payer: Self-pay | Admitting: Family Medicine

## 2019-07-01 ENCOUNTER — Ambulatory Visit: Payer: Medicare Other | Admitting: Neurology

## 2019-07-01 DIAGNOSIS — R569 Unspecified convulsions: Secondary | ICD-10-CM | POA: Diagnosis not present

## 2019-07-02 ENCOUNTER — Telehealth: Payer: Self-pay

## 2019-07-02 NOTE — Telephone Encounter (Signed)
-----   Message from Garvin Fila, MD sent at 07/02/2019  9:48 AM EDT ----- Cynthia [name] inform the patient that EEG study was normal

## 2019-07-02 NOTE — Telephone Encounter (Signed)
I called pt and advised her that the EEG was normal. Pt verbalized understanding of results. Pt had no questions at this time but was encouraged to call back if questions arise.

## 2019-07-09 ENCOUNTER — Other Ambulatory Visit: Payer: Self-pay

## 2019-07-09 DIAGNOSIS — R569 Unspecified convulsions: Secondary | ICD-10-CM

## 2019-07-09 MED ORDER — LEVETIRACETAM ER 500 MG PO TB24: 500.0000 mg | ORAL_TABLET | Freq: Every day | ORAL | 2 refills | Status: DC

## 2019-07-11 ENCOUNTER — Other Ambulatory Visit: Payer: Self-pay | Admitting: Family Medicine

## 2019-07-11 NOTE — Telephone Encounter (Signed)
Ok to refill??  Last office visit 03/29/2019.  Last refill 06/10/2019.

## 2019-07-17 ENCOUNTER — Ambulatory Visit (INDEPENDENT_AMBULATORY_CARE_PROVIDER_SITE_OTHER): Payer: Medicare Other | Admitting: Family Medicine

## 2019-07-17 ENCOUNTER — Encounter: Payer: Self-pay | Admitting: Family Medicine

## 2019-07-17 ENCOUNTER — Other Ambulatory Visit: Payer: Self-pay

## 2019-07-17 VITALS — BP 126/80 | HR 82 | Temp 98.2°F | Resp 16 | Ht 66.5 in | Wt 158.0 lb

## 2019-07-17 DIAGNOSIS — R300 Dysuria: Secondary | ICD-10-CM | POA: Diagnosis not present

## 2019-07-17 DIAGNOSIS — I10 Essential (primary) hypertension: Secondary | ICD-10-CM | POA: Diagnosis not present

## 2019-07-17 LAB — URINALYSIS, ROUTINE W REFLEX MICROSCOPIC
Bilirubin Urine: NEGATIVE
Glucose, UA: NEGATIVE
Hgb urine dipstick: NEGATIVE
Ketones, ur: NEGATIVE
Leukocytes,Ua: NEGATIVE
Nitrite: NEGATIVE
Protein, ur: NEGATIVE
Specific Gravity, Urine: 1.015 (ref 1.001–1.03)
pH: 7 (ref 5.0–8.0)

## 2019-07-17 MED ORDER — CIPROFLOXACIN HCL 500 MG PO TABS: 500.0000 mg | ORAL_TABLET | Freq: Two times a day (BID) | ORAL | 0 refills | Status: DC

## 2019-07-17 NOTE — Patient Instructions (Signed)
F/U as needed Start antibiotics

## 2019-07-17 NOTE — Progress Notes (Signed)
Subjective:    Patient ID: Cynthia [name], female    DOB: 05/23/1946, 73 y.o.   MRN: [id number]  Patient presents for Dysuria  Patient here with abdominal pain low-grade fever 99.26F.  This started last night.  She has history of cognitive decline secondary to stroke and traumatic brain injury.  She does have underlying epilepsy and has been stable on Keppra.  Her Bp was elevated 150's last night around 10:30 p.m. she also was having some lower abdominal discomfort. Around 2:15am she had severe episode of lower abd pain, when she woke up, BP was still elevated at that time as well. trying to get to the batrhoom felt weak and felt like she was going to pass out.  She had discomfort when she was trying to pass her urine.  She does have history of urinary tract infections.  She now recalls the past few days she has had sharp pains when she would start urinating but did not have any abdominal pain accompanying it no gross hematuria no fever no vomiting.  She called EMS in the middle the night to assess her.  Recommended that she call the office in the morning to be evaluated.  Her blood pressure did come down as well once EMS checked her over and she felt like she was not in a dangerous situation. Note she has had a little dry cough, no congestion, no SOB, no sinus symptoms     Review Of Systems:  GEN- denies fatigue, fever, weight loss,weakness, recent illness HEENT- denies eye drainage, change in vision, nasal discharge, CVS- denies chest pain, palpitations RESP- denies SOB, cough, wheeze ABD- denies N/V, change in stools, +abd pain GU-+dysuria, hematuria, dribbling, incontinence MSK- denies joint pain, muscle aches, injury Neuro- denies headache, dizziness, syncope, seizure activity       Objective:    BP 126/80   Pulse 82   Temp 98.2 F (36.8 C) (Temporal)   Resp 16   Ht 5' 6.5" (1.689 m)   Wt 158 lb (71.7 kg)   SpO2 98%   BMI 25.12 kg/m  GEN- NAD, alert and oriented x3,  well-appearing HEENT- PERRL, EOMI, non injected sclera, pink conjunctiva, MMM, oropharynx clear, nares clear Neck- Supple, no adenopathy CVS- RRR, no murmur RESP-CTAB ABD-NABS,soft, nondistended mild tenderness to palpation suprapubic region no CVA tenderness no guarding no rebound EXT- No edema Pulses- Radial,2+        Assessment & Plan:      Problem List Items Addressed This Visit      Unprioritized   HTN (hypertension)    Blood pressure looks good today.  I think this is likely elevated in setting of the pain response and then her anxiety session when EMS had to be called.  No change in her medications I will check her renal function again.  Regarding her lower abdominal pain and urinary symptoms sounds like classic UTI she does have history of this.  Her urinalysis however was unremarkable.  I Minna check a CBC along with a metabolic panel per above we will send a urine culture.  We will go ahead and start her on ciprofloxacin for now.  She had a benign exam.      Relevant Orders   CBC with Differential/Platelet   Comprehensive metabolic panel    Other Visit Diagnoses    Dysuria    -  Primary   Relevant Orders   Urinalysis, Routine w reflex microscopic (Completed)   Urine Culture  Note: This dictation was prepared with Dragon dictation along with smaller phrase technology. Any transcriptional errors that result from this process are unintentional.

## 2019-07-17 NOTE — Assessment & Plan Note (Signed)
Blood pressure looks good today.  I think this is likely elevated in setting of the pain response and then her anxiety session when EMS had to be called.  No change in her medications I will check her renal function again.  Regarding her lower abdominal pain and urinary symptoms sounds like classic UTI she does have history of this.  Her urinalysis however was unremarkable.  I Minna check a CBC along with a metabolic panel per above we will send a urine culture.  We will go ahead and start her on ciprofloxacin for now.  She had a benign exam.

## 2019-07-18 LAB — COMPREHENSIVE METABOLIC PANEL
AG Ratio: 1.7 (calc) (ref 1.0–2.5)
ALT: 18 U/L (ref 6–29)
AST: 23 U/L (ref 10–35)
Albumin: 4.1 g/dL (ref 3.6–5.1)
Alkaline phosphatase (APISO): 62 U/L (ref 37–153)
BUN: 16 mg/dL (ref 7–25)
CO2: 27 mmol/L (ref 20–32)
Calcium: 9.2 mg/dL (ref 8.6–10.4)
Chloride: 99 mmol/L (ref 98–110)
Creat: 0.79 mg/dL (ref 0.60–0.93)
Globulin: 2.4 g/dL (calc) (ref 1.9–3.7)
Glucose, Bld: 93 mg/dL (ref 65–99)
Potassium: 4.5 mmol/L (ref 3.5–5.3)
Sodium: 135 mmol/L (ref 135–146)
Total Bilirubin: 1 mg/dL (ref 0.2–1.2)
Total Protein: 6.5 g/dL (ref 6.1–8.1)

## 2019-07-18 LAB — CBC WITH DIFFERENTIAL/PLATELET
Absolute Monocytes: 1124 cells/uL — ABNORMAL HIGH (ref 200–950)
Basophils Absolute: 32 cells/uL (ref 0–200)
Basophils Relative: 0.3 %
Eosinophils Absolute: 21 cells/uL (ref 15–500)
Eosinophils Relative: 0.2 %
HCT: 34.2 % — ABNORMAL LOW (ref 35.0–45.0)
Hemoglobin: 11.8 g/dL (ref 11.7–15.5)
Lymphs Abs: 1707 cells/uL (ref 850–3900)
MCH: 31.2 pg (ref 27.0–33.0)
MCHC: 34.5 g/dL (ref 32.0–36.0)
MCV: 90.5 fL (ref 80.0–100.0)
MPV: 9.8 fL (ref 7.5–12.5)
Monocytes Relative: 10.6 %
Neutro Abs: 7717 cells/uL (ref 1500–7800)
Neutrophils Relative %: 72.8 %
Platelets: 281 10*3/uL (ref 140–400)
RBC: 3.78 10*6/uL — ABNORMAL LOW (ref 3.80–5.10)
RDW: 12.6 % (ref 11.0–15.0)
Total Lymphocyte: 16.1 %
WBC: 10.6 10*3/uL (ref 3.8–10.8)

## 2019-07-19 LAB — URINE CULTURE
MICRO NUMBER:: 1064019
SPECIMEN QUALITY:: ADEQUATE

## 2019-07-24 ENCOUNTER — Telehealth: Payer: Self-pay

## 2019-07-24 ENCOUNTER — Other Ambulatory Visit: Payer: Medicare Other

## 2019-07-24 ENCOUNTER — Other Ambulatory Visit: Payer: Self-pay

## 2019-07-24 DIAGNOSIS — R300 Dysuria: Secondary | ICD-10-CM

## 2019-07-24 DIAGNOSIS — R399 Unspecified symptoms and signs involving the genitourinary system: Secondary | ICD-10-CM

## 2019-07-24 LAB — URINALYSIS, ROUTINE W REFLEX MICROSCOPIC
Bilirubin Urine: NEGATIVE
Glucose, UA: NEGATIVE
Hgb urine dipstick: NEGATIVE
Ketones, ur: NEGATIVE
Leukocytes,Ua: NEGATIVE
Nitrite: NEGATIVE
Protein, ur: NEGATIVE
Specific Gravity, Urine: 1.02 (ref 1.001–1.03)
pH: 7 (ref 5.0–8.0)

## 2019-07-24 NOTE — Telephone Encounter (Signed)
Error

## 2019-07-25 LAB — URINE CULTURE
MICRO NUMBER:: 1089062
Result:: NO GROWTH
SPECIMEN QUALITY:: ADEQUATE

## 2019-08-20 ENCOUNTER — Other Ambulatory Visit: Payer: Self-pay | Admitting: Family Medicine

## 2019-08-20 NOTE — Telephone Encounter (Signed)
Ok to refill??  Last office visit 07/17/2019.  Last refill 07/11/2019.

## 2019-09-17 DIAGNOSIS — Z20828 Contact with and (suspected) exposure to other viral communicable diseases: Secondary | ICD-10-CM | POA: Diagnosis not present

## 2019-10-03 ENCOUNTER — Other Ambulatory Visit: Payer: Self-pay | Admitting: Family Medicine

## 2019-10-03 NOTE — Telephone Encounter (Signed)
Ok to refill??  Last office visit 03/29/2019.  Last refill 08/20/2019.

## 2019-10-25 DIAGNOSIS — Z83511 Family history of glaucoma: Secondary | ICD-10-CM | POA: Diagnosis not present

## 2019-10-25 DIAGNOSIS — H40013 Open angle with borderline findings, low risk, bilateral: Secondary | ICD-10-CM | POA: Diagnosis not present

## 2019-11-09 ENCOUNTER — Other Ambulatory Visit: Payer: Self-pay | Admitting: Family Medicine

## 2019-11-11 NOTE — Telephone Encounter (Signed)
Ok to refill??  Last office visit 07/17/2019.  Last refill 10/03/2019.  Ok to add refills to prescription?

## 2019-12-19 ENCOUNTER — Other Ambulatory Visit: Payer: Self-pay | Admitting: Family Medicine

## 2019-12-23 ENCOUNTER — Other Ambulatory Visit: Payer: Self-pay | Admitting: Family Medicine

## 2019-12-23 NOTE — Telephone Encounter (Signed)
Ok to refill??  Last office visit 03/29/2019.  Last refill 11/12/2019.

## 2020-01-14 ENCOUNTER — Ambulatory Visit: Payer: Medicare Other | Admitting: Family Medicine

## 2020-01-14 ENCOUNTER — Other Ambulatory Visit: Payer: Self-pay

## 2020-01-14 ENCOUNTER — Encounter: Payer: Self-pay | Admitting: Family Medicine

## 2020-01-14 VITALS — BP 124/74 | HR 88 | Temp 96.9°F | Resp 14 | Ht 66.5 in | Wt 153.0 lb

## 2020-01-14 DIAGNOSIS — M25561 Pain in right knee: Secondary | ICD-10-CM

## 2020-01-14 MED ORDER — MELOXICAM 15 MG PO TABS: 15.0000 mg | ORAL_TABLET | Freq: Every day | ORAL | 0 refills | Status: DC

## 2020-01-14 NOTE — Progress Notes (Signed)
Subjective:    Patient ID: Cynthia [name], female    DOB: [birth year], 74 y.o.   MRN: [id number]   Patient has been experiencing knee pain for about a week.  The pain is located in the right knee.  She is tender to palpation over the medial joint line.  She has pain with Apley grind particularly in the posterior lateral aspect of the knee.  She reports some pain in the distal hamstring both medially and laterally as well as pain going up and down steps behind the kneecap in the right medial compartment.  There is no erythema or effusion.  However she believes that she may have injured the knee when her dog pulled her down by accident.  She believes she twisted the knee while falling. Past Medical History:  Diagnosis Date  . Colon polyps   . Depression    history of  . Osteoporosis   . Seizures ([place])   . Stroke [facility])    Past Surgical History:  Procedure Laterality Date  . IR GENERIC HISTORICAL  08/05/2016   IR ANGIO VERTEBRAL SEL VERTEBRAL UNI R MOD SED 08/05/2016 Luanne Bras, MD [facility]  . IR GENERIC HISTORICAL  08/05/2016   IR ANGIO INTRA EXTRACRAN SEL COM CAROTID INNOMINATE BILAT MOD SED 08/05/2016 Luanne Bras, MD [facility]  . IR GENERIC HISTORICAL  08/05/2016   IR ANGIO VERTEBRAL SEL SUBCLAVIAN INNOMINATE UNI L MOD SED 08/05/2016 Luanne Bras, MD [facility]   Current Outpatient Medications on File Prior to Visit  Medication Sig Dispense Refill  . alendronate (FOSAMAX) 70 MG tablet TAKE 1 TABLET BY MOUTH EVERY 7 DAYS. TAKE WITH FULL GLASS OF WATER ON AN EMPTY STOMACH 12 tablet 3  . ALPRAZolam (XANAX) 0.5 MG tablet TAKE 1/2 TABLET BY MOUTH 3 TIMES A DAY AS NEEDED FOR ANXIETY 45 tablet 0  . aspirin EC 81 MG tablet Take 81 mg by mouth daily.    . Calcium Carbonate-Vitamin D3 (CALCIUM 600-D) 600-400 MG-UNIT TABS Take 1 tablet by mouth 2 (two) times daily.    . ciprofloxacin (CIPRO) 500 MG tablet Take 1 tablet (500 mg total) by mouth 2 (two) times daily.  14 tablet 0  . EPIPEN 2-PAK 0.3 MG/0.3ML SOAJ injection USE AS DIRECTED 2 each 1  . famotidine (PEPCID) 20 MG tablet TAKE 1 TABLET BY MOUTH TWICE A DAY 180 tablet 2  . ferrous sulfate 325 (65 FE) MG tablet Take 325 mg by mouth daily.     Marland Kitchen levETIRAcetam (KEPPRA XR) 500 MG 24 hr tablet Take 1 tablet (500 mg total) by mouth daily. 90 tablet 2  . loratadine (CLARITIN) 10 MG tablet Take 10 mg by mouth daily.    Marland Kitchen losartan (COZAAR) 100 MG tablet TAKE 1 TABLET BY MOUTH EVERY DAY 90 tablet 3  . zinc gluconate 50 MG tablet Take 50 mg by mouth daily.     No current facility-administered medications on file prior to visit.   Allergies  Allergen Reactions  . Other Anaphylaxis    FIRE ANTS Insect stings / bites  . Acyclovir And Related   . Codeine     Syncope   . Penicillins Rash    Has patient had a PCN reaction causing immediate rash, facial/tongue/throat swelling, SOB or lightheadedness with hypotension: Unknown Has patient had a PCN reaction causing severe rash involving mucus membranes or skin necrosis: No Has patient had a PCN reaction that required hospitalization No Has patient had a PCN reaction occurring within the last  10 years: No If all of the above answers are "NO", then may proceed with Cephalosporin use.    Social History   Socioeconomic History  . Marital status: Married    Spouse name: Not on file  . Number of children: 3  . Years of education: Not on file  . Highest education level: Not on file  Occupational History    Comment: teacher, retired  Tobacco Use  . Smoking status: Never Smoker  . Smokeless tobacco: Never Used  Substance and Sexual Activity  . Alcohol use: Yes    Alcohol/week: 1.0 standard drinks    Types: 1 Glasses of wine per week    Comment: 1-2 beers/wine in evening  . Drug use: No  . Sexual activity: Not on file    Comment: Married since 2005  Other Topics Concern  . Not on file  Social History Narrative  . Not on file   Social Determinants  of Health   Financial Resource Strain:   . Difficulty of Paying Living Expenses:   Food Insecurity:   . Worried About Charity fundraiser in the Last Year:   . Arboriculturist in the Last Year:   Transportation Needs:   . Film/video editor (Medical):   Marland Kitchen Lack of Transportation (Non-Medical):   Physical Activity:   . Days of Exercise per Week:   . Minutes of Exercise per Session:   Stress:   . Feeling of Stress :   Social Connections:   . Frequency of Communication with Friends and Family:   . Frequency of Social Gatherings with Friends and Family:   . Attends Religious Services:   . Active Member of Clubs or Organizations:   . Attends Archivist Meetings:   Marland Kitchen Marital Status:   Intimate Partner Violence:   . Fear of Current or Ex-Partner:   . Emotionally Abused:   Marland Kitchen Physically Abused:   . Sexually Abused:      Review of Systems  All other systems reviewed and are negative.      Objective:   Physical Exam  Cardiovascular: Normal rate, regular rhythm and normal heart sounds.  Pulmonary/Chest: Effort normal and breath sounds normal. No respiratory distress. She has no wheezes. She has no rales.  Musculoskeletal:     Right knee: No swelling or effusion. Normal range of motion. Tenderness present. No MCL, LCL or patellar tendon tenderness. No LCL laxity or MCL laxity. Abnormal meniscus.  Psychiatric: She has a normal mood and affect. Her behavior is normal. Judgment and thought content normal.  Vitals reviewed.         Assessment & Plan:   Acute pain of right knee - Plan: DG Knee Complete 4 Views Right  Differential diagnosis includes medial compartment arthritis and/or a meniscal tear.  Begin the patient on meloxicam 15 mg a day and obtain an x-ray of the right knee at the patient's earliest convenience.  If the knee does not improve over the next 2 weeks, the patient could return for cortisone injection assuming that the x-ray shows no other significant  abnormality.

## 2020-01-23 ENCOUNTER — Telehealth: Payer: Self-pay | Admitting: Family Medicine

## 2020-01-23 NOTE — Telephone Encounter (Signed)
Error

## 2020-01-24 ENCOUNTER — Other Ambulatory Visit: Payer: Self-pay

## 2020-01-24 ENCOUNTER — Ambulatory Visit (INDEPENDENT_AMBULATORY_CARE_PROVIDER_SITE_OTHER): Payer: Medicare PPO | Admitting: Family Medicine

## 2020-01-24 ENCOUNTER — Encounter: Payer: Self-pay | Admitting: Family Medicine

## 2020-01-24 VITALS — BP 150/88 | HR 104 | Temp 96.9°F | Resp 14 | Ht 66.5 in | Wt 157.0 lb

## 2020-01-24 DIAGNOSIS — R3989 Other symptoms and signs involving the genitourinary system: Secondary | ICD-10-CM | POA: Diagnosis not present

## 2020-01-24 DIAGNOSIS — R509 Fever, unspecified: Secondary | ICD-10-CM

## 2020-01-24 LAB — URINALYSIS, ROUTINE W REFLEX MICROSCOPIC
Bacteria, UA: NONE SEEN /HPF
Bilirubin Urine: NEGATIVE
Glucose, UA: NEGATIVE
Hyaline Cast: NONE SEEN /LPF
Ketones, ur: NEGATIVE
Nitrite: NEGATIVE
Protein, ur: NEGATIVE
Specific Gravity, Urine: 1.015 (ref 1.001–1.03)
pH: 5.5 (ref 5.0–8.0)

## 2020-01-24 LAB — MICROSCOPIC MESSAGE

## 2020-01-24 MED ORDER — CIPROFLOXACIN HCL 500 MG PO TABS: 500.0000 mg | ORAL_TABLET | Freq: Two times a day (BID) | ORAL | 0 refills | Status: DC

## 2020-01-24 MED ORDER — METRONIDAZOLE 500 MG PO TABS: 500.0000 mg | ORAL_TABLET | Freq: Two times a day (BID) | ORAL | 0 refills | Status: DC

## 2020-01-24 NOTE — Addendum Note (Signed)
Addended by: Shary Decamp B on: 01/24/2020 01:02 PM   Modules accepted: Orders

## 2020-01-24 NOTE — Progress Notes (Signed)
Subjective:    Patient ID: Cynthia [name], female    DOB: 15-Jun-1946, 74 y.o.   MRN: [id number]  HPI Patient believes that she has a urinary tract infection.  She denies any dysuria.  She does have intense lower abdominal pain in her pelvis.  The pain radiates into her pelvic area from the left lower quadrant and the right lower quadrant.  She denies any vaginal bleeding.  She denies any nausea or vomiting however she has had some diarrhea.  She denies any melena or hematochezia.  She is tender to palpation in the left lower quadrant and in the suprapubic area.  She is unable to provide [place] a urine sample day despite waiting and trying several times.  She has been having intermittent fevers to 100.3. Past Medical History:  Diagnosis Date  . Colon polyps   . Depression    history of  . Osteoporosis   . Seizures ([place])   . Stroke [facility])    Past Surgical History:  Procedure Laterality Date  . IR GENERIC HISTORICAL  08/05/2016   IR ANGIO VERTEBRAL SEL VERTEBRAL UNI R MOD SED 08/05/2016 Lu[facility] Bras, MD [facility]  . IR GENERIC HISTORICAL  08/05/2016   IR ANGIO INTRA EXTRACRAN SEL COM CAROTID INNOMINATE BILAT MOD SED 08/05/2016 Lu[facility] Bras, MD [facility]  . IR GENERIC HISTORICAL  08/05/2016   IR ANGIO VERTEBRAL SEL SUBCLAVIAN INNOMINATE UNI L MOD SED 08/05/2016 Luanne Bras, [facility]   Current Outpatient Medications on File Prior to Visit  Medication Sig Dispense Refill  . alendronate (FOSAMAX) 70 MG tablet TAKE 1 TABLET BY MOUTH EVERY 7 DAYS. TAKE WITH FULL GLASS OF WATER ON AN EMPTY STOMACH 12 tablet 3  . ALPRAZolam (XANAX) 0.5 MG tablet TAKE 1/2 TABLET BY MOUTH 3 TIMES A DAY AS NEEDED FOR ANXIETY 45 tablet 0  . aspirin EC 81 MG tablet Take 81 mg by mouth daily.    . Calcium Carbonate-Vitamin D3 (CALCIUM 600-D) 600-400 MG-UNIT TABS Take 1 tablet by mouth 2 (two) times daily.    . ciprofloxacin (CIPRO) 500 MG tablet Take 1 tablet (500 mg total) by mouth 2  (two) times daily. 14 tablet 0  . EPIPEN 2-PAK 0.3 MG/0.3ML SOAJ injection USE AS DIRECTED 2 each 1  . famotidine (PEPCID) 20 MG tablet TAKE 1 TABLET BY MOUTH TWICE A DAY 180 tablet 2  . ferrous sulfate 325 (65 FE) MG tablet Take 325 mg by mouth daily.     Marland Kitchen levETIRAcetam (KEPPRA XR) 500 MG 24 hr tablet Take 1 tablet (500 mg total) by mouth daily. 90 tablet 2  . loratadine (CLARITIN) 10 MG tablet Take 10 mg by mouth daily.    Marland Kitchen losartan (COZAAR) 100 MG tablet TAKE 1 TABLET BY MOUTH EVERY DAY 90 tablet 3  . meloxicam (MOBIC) 15 MG tablet Take 1 tablet (15 mg total) by mouth daily. 30 tablet 0  . zinc gluconate 50 MG tablet Take 50 mg by mouth daily.     No current facility-administered medications on file prior to visit.   Allergies  Allergen Reactions  . Other Anaphylaxis    FIRE ANTS Insect stings / bites  . Acyclovir And Related   . Codeine     Syncope   . Penicillins Rash    Has patient had a PCN reaction causing immediate rash, facial/tongue/throat swelling, SOB or lightheadedness with hypotension: Unknown Has patient had a PCN reaction causing severe rash involving mucus membranes or skin necrosis: No Has  patient had a PCN reaction that required hospitalization No Has patient had a PCN reaction occurring within the last 10 years: No If all of the above answers are "NO", then may proceed with Cephalosporin use.    Social History   Socioeconomic History  . Marital status: Married    Spouse name: Not on file  . Number of children: 3  . Years of education: Not on file  . Highest education level: Not on file  Occupational History    Comment: teacher, retired  Tobacco Use  . Smoking status: Never Smoker  . Smokeless tobacco: Never Used  Substance and Sexual Activity  . Alcohol use: Yes    Alcohol/week: 1.0 standard drinks    Types: 1 Glasses of wine per week    Comment: 1-2 beers/wine in evening  . Drug use: No  . Sexual activity: Not on file    Comment: Married since  2005  Other Topics Concern  . Not on file  Social History Narrative  . Not on file   Social Determinants of Health   Financial Resource Strain:   . Difficulty of Paying Living Expenses:   Food Insecurity:   . Worried About Charity fundraiser in the Last Year:   . Arboriculturist in the Last Year:   Transportation Needs:   . Film/video editor (Medical):   Marland Kitchen Lack of Transportation (Non-Medical):   Physical Activity:   . Days of Exercise per Week:   . Minutes of Exercise per Session:   Stress:   . Feeling of Stress :   Social Connections:   . Frequency of Communication with Friends and Family:   . Frequency of Social Gatherings with Friends and Family:   . Attends Religious Services:   . Active Member of Clubs or Organizations:   . Attends Archivist Meetings:   Marland Kitchen Marital Status:   Intimate Partner Violence:   . Fear of Current or Ex-Partner:   . Emotionally Abused:   Marland Kitchen Physically Abused:   . Sexually Abused:       Review of Systems  All other systems reviewed and are negative.      Objective:   Physical Exam  Constitutional: She appears well-developed and well-nourished.  Cardiovascular: Normal rate, regular rhythm and normal heart sounds.  Pulmonary/Chest: Effort normal and breath sounds normal. No respiratory distress. She has no wheezes. She has no rales.  Abdominal: Soft. Bowel sounds are normal. She exhibits no distension. There is abdominal tenderness. There is no rebound and no guarding.  Musculoskeletal:        General: No edema.  Vitals reviewed.         Assessment & Plan:  Bladder pain - Plan: Urinalysis, Routine w reflex microscopic  Low grade fever - Plan: Urinalysis, Routine w reflex microscopic  I am not convinced that this is a urinary tract infection.  I am more concerned about possible colitis given the pattern of the pain.  Therefore I have recommended Cipro 500 mg p.o. twice daily for 10 days and Flagyl 500 mg p.o. twice  daily for 10 days.  I would like to check a urinalysis however the patient is unable to give [place] a urine sample BA.  Therefore I will treat the patient empirically for possible colitis and reassess next week if no better or sooner if worsening.

## 2020-01-25 LAB — URINE CULTURE
MICRO NUMBER:: 10479056
SPECIMEN QUALITY:: ADEQUATE

## 2020-01-27 ENCOUNTER — Ambulatory Visit: Payer: Medicare PPO | Admitting: Family Medicine

## 2020-01-29 ENCOUNTER — Telehealth: Payer: Self-pay | Admitting: Family Medicine

## 2020-01-29 NOTE — Telephone Encounter (Signed)
Pt wanted to know if she is supposed to keep taking the Meloxicam or stop it as you had only mentioned her taking it for 20 days but the rx was written for 30?   Pt also states that she wanted information on diverticulosis as in what to eat and what to avoid?

## 2020-01-30 NOTE — Telephone Encounter (Signed)
I would stop meloxicam after 3 weeks.  Diverticulosis can be inflamed by foods with tiny seeds (strawberries, sesame, kiwi) and nuts.  Better with fiber, greens, vegetables

## 2020-02-02 ENCOUNTER — Other Ambulatory Visit: Payer: Self-pay | Admitting: Family Medicine

## 2020-02-03 NOTE — Telephone Encounter (Signed)
Ok to refill??  Last office visit 01/24/2020.  Last refill 12/24/2019.

## 2020-02-06 ENCOUNTER — Telehealth: Payer: Self-pay | Admitting: Family Medicine

## 2020-02-06 NOTE — Telephone Encounter (Signed)
Patient called in stating that she is almost out of Meloxicam and she wants to  know if you recommend she continue to take it for her torn meniscus? Pain has been controlled on the Meloxicam. Also patient wants to know if she needs to continue her low dose aspirin?

## 2020-02-11 NOTE — Telephone Encounter (Signed)
Decrease meloxicam to PRN only and see how often she has to take it.  Taking it everyday can eventually irritate her stomach.

## 2020-02-11 NOTE — Telephone Encounter (Signed)
Spoke with patient and informed her of Dr. Samella Parr recommendations. Patient verbalized understanding.

## 2020-03-29 ENCOUNTER — Other Ambulatory Visit: Payer: Self-pay | Admitting: Family Medicine

## 2020-03-30 NOTE — Telephone Encounter (Signed)
Ok to refill??  Last office visit 01/24/2020.  Last refill 02/03/2020.

## 2020-04-03 DIAGNOSIS — Z86010 Personal history of colonic polyps: Secondary | ICD-10-CM | POA: Diagnosis not present

## 2020-04-03 DIAGNOSIS — K5790 Diverticulosis of intestine, part unspecified, without perforation or abscess without bleeding: Secondary | ICD-10-CM | POA: Diagnosis not present

## 2020-04-03 DIAGNOSIS — R1030 Lower abdominal pain, unspecified: Secondary | ICD-10-CM | POA: Diagnosis not present

## 2020-04-23 DIAGNOSIS — H40013 Open angle with borderline findings, low risk, bilateral: Secondary | ICD-10-CM | POA: Diagnosis not present

## 2020-05-05 ENCOUNTER — Other Ambulatory Visit: Payer: Self-pay | Admitting: Family Medicine

## 2020-05-09 ENCOUNTER — Other Ambulatory Visit: Payer: Self-pay | Admitting: Family Medicine

## 2020-05-11 NOTE — Telephone Encounter (Signed)
Requested Prescriptions   Pending Prescriptions Disp Refills  . ALPRAZolam (XANAX) 0.5 MG tablet [Pharmacy Med Name: ALPRAZOLAM 0.5 MG TABLET] 45 tablet 0    Sig: TAKE 1/2 TABLET BY MOUTH 3 TIMES A DAY AS NEEDED FOR ANXIETY     Last OV 01/24/2020   Last written 03/30/2020

## 2020-05-25 ENCOUNTER — Other Ambulatory Visit: Payer: Self-pay

## 2020-05-25 ENCOUNTER — Other Ambulatory Visit: Payer: Medicare PPO

## 2020-05-25 DIAGNOSIS — Z13220 Encounter for screening for lipoid disorders: Secondary | ICD-10-CM | POA: Diagnosis not present

## 2020-05-25 DIAGNOSIS — R5382 Chronic fatigue, unspecified: Secondary | ICD-10-CM

## 2020-05-25 DIAGNOSIS — Z0000 Encounter for general adult medical examination without abnormal findings: Secondary | ICD-10-CM | POA: Diagnosis not present

## 2020-05-25 DIAGNOSIS — Z136 Encounter for screening for cardiovascular disorders: Secondary | ICD-10-CM | POA: Diagnosis not present

## 2020-05-26 LAB — CBC WITH DIFFERENTIAL/PLATELET
Absolute Monocytes: 490 cells/uL (ref 200–950)
Basophils Absolute: 31 cells/uL (ref 0–200)
Basophils Relative: 0.5 %
Eosinophils Absolute: 68 cells/uL (ref 15–500)
Eosinophils Relative: 1.1 %
HCT: 37.2 % (ref 35.0–45.0)
Hemoglobin: 12.4 g/dL (ref 11.7–15.5)
Lymphs Abs: 2170 cells/uL (ref 850–3900)
MCH: 30.5 pg (ref 27.0–33.0)
MCHC: 33.3 g/dL (ref 32.0–36.0)
MCV: 91.6 fL (ref 80.0–100.0)
MPV: 9.9 fL (ref 7.5–12.5)
Monocytes Relative: 7.9 %
Neutro Abs: 3441 cells/uL (ref 1500–7800)
Neutrophils Relative %: 55.5 %
Platelets: 310 10*3/uL (ref 140–400)
RBC: 4.06 10*6/uL (ref 3.80–5.10)
RDW: 13.1 % (ref 11.0–15.0)
Total Lymphocyte: 35 %
WBC: 6.2 10*3/uL (ref 3.8–10.8)

## 2020-05-26 LAB — COMPLETE METABOLIC PANEL WITH GFR
AG Ratio: 1.9 (calc) (ref 1.0–2.5)
ALT: 16 U/L (ref 6–29)
AST: 20 U/L (ref 10–35)
Albumin: 4.2 g/dL (ref 3.6–5.1)
Alkaline phosphatase (APISO): 55 U/L (ref 37–153)
BUN: 17 mg/dL (ref 7–25)
CO2: 31 mmol/L (ref 20–32)
Calcium: 9.5 mg/dL (ref 8.6–10.4)
Chloride: 104 mmol/L (ref 98–110)
Creat: 0.83 mg/dL (ref 0.60–0.93)
GFR, Est African American: 82 mL/min/{1.73_m2} (ref >=60)
GFR, Est Non African American: 70 mL/min/{1.73_m2} (ref >=60)
Globulin: 2.2 g/dL (calc) (ref 1.9–3.7)
Glucose, Bld: 97 mg/dL (ref 65–99)
Potassium: 4 mmol/L (ref 3.5–5.3)
Sodium: 141 mmol/L (ref 135–146)
Total Bilirubin: 0.5 mg/dL (ref 0.2–1.2)
Total Protein: 6.4 g/dL (ref 6.1–8.1)

## 2020-05-26 LAB — LIPID PANEL
Cholesterol: 207 mg/dL — ABNORMAL HIGH (ref <200)
HDL: 89 mg/dL (ref >=50)
LDL Cholesterol (Calc): 101 mg/dL (calc) — ABNORMAL HIGH
Non-HDL Cholesterol (Calc): 118 mg/dL (calc) (ref <130)
Total CHOL/HDL Ratio: 2.3 (calc) (ref <5.0)
Triglycerides: 78 mg/dL (ref <150)

## 2020-06-02 ENCOUNTER — Other Ambulatory Visit: Payer: Self-pay

## 2020-06-02 ENCOUNTER — Ambulatory Visit (INDEPENDENT_AMBULATORY_CARE_PROVIDER_SITE_OTHER): Payer: Medicare PPO | Admitting: Family Medicine

## 2020-06-02 VITALS — BP 130/64 | HR 95 | Temp 97.9°F | Ht 66.0 in | Wt 148.0 lb

## 2020-06-02 DIAGNOSIS — I10 Essential (primary) hypertension: Secondary | ICD-10-CM

## 2020-06-02 DIAGNOSIS — M810 Age-related osteoporosis without current pathological fracture: Secondary | ICD-10-CM

## 2020-06-02 DIAGNOSIS — Z8673 Personal history of transient ischemic attack (TIA), and cerebral infarction without residual deficits: Secondary | ICD-10-CM | POA: Diagnosis not present

## 2020-06-02 DIAGNOSIS — G40209 Localization-related (focal) (partial) symptomatic epilepsy and epileptic syndromes with complex partial seizures, not intractable, without status epilepticus: Secondary | ICD-10-CM | POA: Diagnosis not present

## 2020-06-02 NOTE — Progress Notes (Signed)
Subjective:    Patient ID: Cynthia [name], female    DOB: Sep 24, 1945, 74 y.o.   MRN: [id number]  Medication Refill    04/14/16 Patient was admitted to the hospital July 9 with difficulty finding words and neglect of her left leg and some left-sided weakness. CT scan revealed a large right frontal intracranial hemorrhage. I have copied relevant portions of the discharge summary and included them below for my reference: Admit date: 03/20/2016 Discharge date: 03/24/2016  Admission Diagnoses: Headache and word finding difficulty  Discharge Diagnoses: Right frontal lobar intracerebral hemorrhage of indeterminate etiology. Mild left hemiparesis and speech difficulties Active Problems:   Intracerebral hemorrhage   ICH (intracerebral hemorrhage) (HCC)   Vascular headache   Benign essential HTN   Hyponatremia   Acute blood loss anemia   TBI (traumatic brain injury) ([place]) Mild cytotoxic edema  Discharged Condition: good  Hospital Course: 74 year old right-handed woman who presented to the [facility] Emergency department after being sent from her doctor's office due to concern for possible stroke.The patient  reported that the night of 03/19/2016 (LKW, time unknown) she developed a sense of feeling disoriented and having difficulty speaking. When asked about her speech, she described that she had trouble focusing her thoughts and getting her words out. She reported that her sister also noticed that she seemed slow to respond to questions. She felt as if her speech was somewhat slurred as well. She did not endorse any weakness, numbness, vision changes, difficulty swallowing, or balance impairments. However, she had noticed that she will find her left arm in unusual postures and positions without being aware that it is doing anything. When she looked at it, she was able to control it without a problem and again denied any weakness or sensory changes. She  also had a headache for the past 2 days.   She denies any similar previous episodes. She denied any recent trauma to the head. She did note that she was working with her brother at her home and had been up on a ladder. She was getting off the ladder, she thought she was on the ground but instead was on the second step of the latter when she stumbled backwards. She says that she struck her bottom on a nearby chair, then fell to the floor landing on her bottom. She did not strike her head. She has not had any recent illness. She does not use daily antiplatelet therapy or anticoagulation. She uses occasional Advil for headache and pain and has taken a total of six 200 mg tablets since last night. She also reports that she took a baby aspirin earlier today as recommended by her sister because of her symptoms.CT brain on admission showed a large right frontal ICH. Patient was not administered IV t-PA secondary to [place]. She was admitted to the neuro ICU for further evaluation and treatment. Her blood pressure was tightly controlled initially with IV drip and subsequently with oral medications. Follow-up CT scan showed stable appearance of the hemorrhage. There was mild cytotoxic edema but no hydrocephalus or any hemorrhage expansion. CT angiogram and CT venogram did not show any aneurysms or venous sinus thrombosis but there are no engorged vessels in the region of the hemorrhage raising concern about a small AVM hence cerebral catheter angiogram was performed and findings of which are also indeterminate and showed abnormal right frontopolar branches .She had persistent left-sided weakness but her speech and word finding difficulties improved. She was transferred to the neurology floor in a  condition remained stable. She was seen by physical occupational therapist and felt to be a good candidate for inpatient rehabilitation. She was accepted for transfer to rehabilitation in stable condition.   Consults: rehabilitation medicine and ointerventional  radiology  Significant Diagnostic Studies:   CT R frontal lobe ICH, 20 mL, possible trace SDH  MRI R posterior frontal lobe ICH, 57-30 days old. No other blood products seen.  CTA head and neck No avm/aneurysm  CT venogram no sinus thrombosis  Cerebral angio No definite AVM or aneurysm noted but abnormal right frontopolar branches raises the question of possible small AVM  LDL 84  HgbA1c  5.3  She is here today for follow-up. She is doing remarkably well. She is walking without difficulty. She is in physical therapy. At the present time she is having no difficulty speaking. She denies any difficulty with short-term or long-term memory. She denies any personality changes. Her biggest concern is what caused this and how to prevent it in the future. She was still taking meloxicam. She is on no other agent that within her blood. Her blood pressure today was extremely elevated on intake. However after letting the patient sit comfortably for a few minutes, her blood pressure fell to 148/90.  AT that time, my plan was: At the time of her initial stroke, angiogram revealed no specific cause for the intracranial hemorrhage. Neurology has recommended repeating the test in 2 months. Patient is scheduled to follow with a neurologist in one month. In the meantime I want her to avoid any agents that can increase her risk of bleeding. I recommended that she stay away from meloxicam. She can use Tylenol if necessary for pain. I recommended avoiding I loosely aspirin or any other agent such as fish oil. Mother primary concern is her blood pressure. Even after calming down, her blood pressure is elevated today. She will check her blood pressure frequently over the weekend and call me with the values on Monday. Her goal blood pressures less than 130/80. If above this, I will start the patient on an angiotensin receptor blocker.  05/05/16 Patient is here today for follow-up. Ultimately started losartan 50 mg by  mouth daily. Lead pressure at home has been averaging between 115 and 130/70-80. She is tolerating the medication well with no side effects.  At that time, my plan was: Blood pressure is much better controlled. Continue losartan 50 mg by mouth daily. Recheck in 6 months   09/22/16 Patient presents with a very complicated problem. She has a history of intracranial hemorrhage. Follow-up MRI in November revealed no arteriovenous malformations or aneurysms. There were signs of chronic microvascular ischemia although there were mild. She has had numerous MRAs performed which revealed no significant extracranial or intracranial stenosis. Her cholesterol is outstanding. Her only risk factor would be elevated blood pressures. In August she had a transient episode where her left leg distal to her knee became numb. At the exact same time the left side of her face became numb. Symptoms lasted several minutes and resolve spontaneously. In the last 2 weeks she's had 2 separate incidents where her left arm from her shoulder to her hand became numb and weak. At the exact same time the left side of her face also became numb. This occurred only in one event. Other event although the arm was affected. In each of those events the symptoms last a proximally 10 minutes. There is no speech disturbance or facial asymmetry but symptoms are concerning for TIA. The  unusual feature is that there are no risk factors for TIA.  At that time, my plan was: Patient has had a thorough diagnostic workup. At this point symptoms are consistent with a TIA. The decision is whether the risk of recurrent intracranial hemorrhage on antiplatelet therapy outweighs the risk of primary prevention of CVA secondary to TIA on antiplatelet therapy. After having a long discussion with the patient she agrees to start taking an aspirin 81 mg daily. His been more than 6 months since her stroke and therefore risk of extension of the previous hemorrhagic cva is not  a consideration. I will increase her losartan 100 mg a day. Her home blood pressures are typically 130 to 140/80-90. I will also set her up to see her neurologist for a second opinion as to whether we should do to manage this  03/29/19 Patient saw neurology who felt the numbness in the left side of her face left arm and left leg represented complex partial seizures.  They started the patient on Keppra and since that time the episodes of numbness have subsided.  Overall she is doing well however she does complain of fatigue.  Her blood pressure is outstanding at home.  The majority of her systolic blood pressures are in the 120s.  Her diastolic blood pressures are always well controlled in the 70s.  She will have the occasional low blood pressure in the occasional high blood pressure but the vast majority are 120-130/60-70.  AT that time, my plan was: 1. Essential hypertension Her blood pressure is outstanding.  I will make no changes in her antihypertensive medication at this time.  Her lab work is excellent.  Her cholesterol is outstanding.  2. Osteoporosis, unspecified osteoporosis type, unspecified pathological fracture presence Patient's last bone density was December 2017.  She is due this year.  However due to the current coronavirus pandemic we have elected to wait till after the first of the year prior to repeating the bone density test.  3. History of stroke Patient has some mild word finding difficulties but otherwise is doing exceptionally well  4. Chronic fatigue I will add a TSH and a vitamin B12 level to her lab work.  I believe some of her fatigue could be stress related secondary to her husband's alcohol use. - TSH - Vitamin B12  06/02/20 Patient states that she has not had a seizure in more than 3 years.  Seizures were characterized by numbness on the left side of her body including arms and legs and face.  She has not had another seizure since she has been on Keppra.  Her  neurologist is recommended that she continue this medication lifelong.  She is questioning if she should stop the aspirin.  I previously gave her an aspirin with a concern that she may be having TIAs causing the numbness on the left side of her body.  However now that that has been proven to be partial seizures, there is no benefit her taking an aspirin as her previous stroke was hemorrhagic.  Her blood pressures well controlled today 130/64.  Her most recent lab work is listed below.  She is due for her bone density test. Lab on 05/25/2020  Component Date Value Ref Range Status  . WBC 05/25/2020 6.2  3.8 - 10.8 Thousand/uL Final  . RBC 05/25/2020 4.06  3.80 - 5.10 Million/uL Final  . Hemoglobin 05/25/2020 12.4  11.7 - 15.5 g/dL Final  . HCT 05/25/2020 37.2  35 - 45 % Final  .  MCV 05/25/2020 91.6  80.0 - 100.0 fL Final  . MCH 05/25/2020 30.5  27.0 - 33.0 pg Final  . MCHC 05/25/2020 33.3  32.0 - 36.0 g/dL Final  . RDW 05/25/2020 13.1  11.0 - 15.0 % Final  . Platelets 05/25/2020 310  140 - 400 Thousand/uL Final  . MPV 05/25/2020 9.9  7.5 - 12.5 fL Final  . Neutro Abs 05/25/2020 3,441  1,500 - 7,800 cells/uL Final  . Lymphs Abs 05/25/2020 2,170  850 - 3,900 cells/uL Final  . Absolute Monocytes 05/25/2020 490  200 - 950 cells/uL Final  . Eosinophils Absolute 05/25/2020 68  15 - 500 cells/uL Final  . Basophils Absolute 05/25/2020 31  0 - 200 cells/uL Final  . Neutrophils Relative % 05/25/2020 55.5  % Final  . Total Lymphocyte 05/25/2020 35.0  % Final  . Monocytes Relative 05/25/2020 7.9  % Final  . Eosinophils Relative 05/25/2020 1.1  % Final  . Basophils Relative 05/25/2020 0.5  % Final  . Cholesterol 05/25/2020 207* <200 mg/dL Final  . HDL 05/25/2020 89  > OR = 50 mg/dL Final  . Triglycerides 05/25/2020 78  <150 mg/dL Final  . LDL Cholesterol (Calc) 05/25/2020 101* mg/dL (calc) Final   Comment: Reference range: <100 . Desirable range <100 mg/dL for primary prevention;   <70 mg/dL for  patients with CHD or diabetic patients  with > or = 2 CHD risk factors. Marland Kitchen LDL-C is now calculated using the Martin-Hopkins  calculation, which is a validated novel method providing  better accuracy than the Friedewald equation in the  estimation of LDL-C.  Cresenciano Genre et al. Annamaria Helling. 7510;258(52): 2061-2068  (http://education.[employer].com/faq/FAQ164)   . Total CHOL/HDL Ratio 05/25/2020 2.3  <5.0 (calc) Final  . Non-HDL Cholesterol (Calc) 05/25/2020 118  <130 mg/dL (calc) Final   Comment: For patients with diabetes plus 1 major ASCVD risk  factor, treating to a non-HDL-C goal of <100 mg/dL  (LDL-C of <70 mg/dL) is considered a therapeutic  option.   . Glucose, Bld 05/25/2020 97  65 - 99 mg/dL Final   Comment: .            Fasting reference interval .   . BUN 05/25/2020 17  7 - 25 mg/dL Final  . Creat 05/25/2020 0.83  0.60 - 0.93 mg/dL Final   Comment: For patients >30 years of age, the reference limit for Creatinine is approximately 13% higher for people identified as African-American. .   . GFR, Est Non African American 05/25/2020 70  > OR = 60 mL/min/1.2m2 Final  . GFR, Est African American 05/25/2020 82  > OR = 60 mL/min/1.41m2 Final  . BUN/Creatinine Ratio 77/82/4235 NOT APPLICABLE  6 - 22 (calc) Final  . Sodium 05/25/2020 141  135 - 146 mmol/L Final  . Potassium 05/25/2020 4.0  3.5 - 5.3 mmol/L Final  . Chloride 05/25/2020 104  98 - 110 mmol/L Final  . CO2 05/25/2020 31  20 - 32 mmol/L Final  . Calcium 05/25/2020 9.5  8.6 - 10.4 mg/dL Final  . Total Protein 05/25/2020 6.4  6.1 - 8.1 g/dL Final  . Albumin 05/25/2020 4.2  3.6 - 5.1 g/dL Final  . Globulin 05/25/2020 2.2  1.9 - 3.7 g/dL (calc) Final  . AG Ratio 05/25/2020 1.9  1.0 - 2.5 (calc) Final  . Total Bilirubin 05/25/2020 0.5  0.2 - 1.2 mg/dL Final  . Alkaline phosphatase (APISO) 05/25/2020 55  37 - 153 U/L Final  . AST 05/25/2020 20  10 -  35 U/L Final  . ALT 05/25/2020 16  6 - 29 U/L Final    Past Medical  History:  Diagnosis Date  . Colon polyps   . Depression    history of  . Osteoporosis   . Seizures ([place])   . Stroke [facility])    Past Surgical History:  Procedure Laterality Date  . IR GENERIC HISTORICAL  08/05/2016   IR ANGIO VERTEBRAL SEL VERTEBRAL UNI R MOD SED 08/05/2016 Luanne Bras, MD [facility]  . IR GENERIC HISTORICAL  08/05/2016   IR ANGIO INTRA EXTRACRAN SEL COM CAROTID INNOMINATE BILAT MOD SED 08/05/2016 Luanne Bras, MD [facility]  . IR GENERIC HISTORICAL  08/05/2016   IR ANGIO VERTEBRAL SEL SUBCLAVIAN INNOMINATE UNI L MOD SED 08/05/2016 Luanne Bras, MD [facility]   Current Outpatient Medications on File Prior to Visit  Medication Sig Dispense Refill  . alendronate (FOSAMAX) 70 MG tablet TAKE 1 TABLET BY MOUTH EVERY 7 DAYS. TAKE WITH FULL GLASS OF WATER ON AN EMPTY STOMACH 12 tablet 3  . ALPRAZolam (XANAX) 0.5 MG tablet TAKE 1/2 TABLET BY MOUTH 3 TIMES A DAY AS NEEDED FOR ANXIETY 45 tablet 0  . aspirin EC 81 MG tablet Take 81 mg by mouth daily.    . Calcium Carbonate-Vitamin D3 (CALCIUM 600-D) 600-400 MG-UNIT TABS Take 1 tablet by mouth 2 (two) times daily.    . ciprofloxacin (CIPRO) 500 MG tablet Take 1 tablet (500 mg total) by mouth 2 (two) times daily. 14 tablet 0  . ciprofloxacin (CIPRO) 500 MG tablet Take 1 tablet (500 mg total) by mouth 2 (two) times daily. 20 tablet 0  . EPIPEN 2-PAK 0.3 MG/0.3ML SOAJ injection USE AS DIRECTED 2 each 1  . famotidine (PEPCID) 20 MG tablet TAKE 1 TABLET BY MOUTH TWICE A DAY 180 tablet 2  . ferrous sulfate 325 (65 FE) MG tablet Take 325 mg by mouth daily.     Marland Kitchen levETIRAcetam (KEPPRA XR) 500 MG 24 hr tablet Take 1 tablet (500 mg total) by mouth daily. 90 tablet 2  . loratadine (CLARITIN) 10 MG tablet Take 10 mg by mouth daily.    Marland Kitchen losartan (COZAAR) 100 MG tablet TAKE 1 TABLET BY MOUTH EVERY DAY 90 tablet 3  . meloxicam (MOBIC) 15 MG tablet Take 1 tablet (15 mg total) by mouth daily. 30 tablet 0  . metroNIDAZOLE  (FLAGYL) 500 MG tablet Take 1 tablet (500 mg total) by mouth 2 (two) times daily. 20 tablet 0  . zinc gluconate 50 MG tablet Take 50 mg by mouth daily.     No current facility-administered medications on file prior to visit.   Allergies  Allergen Reactions  . Other Anaphylaxis    FIRE ANTS Insect stings / bites  . Acyclovir And Related   . Codeine     Syncope   . Penicillins Rash    Has patient had a PCN reaction causing immediate rash, facial/tongue/throat swelling, SOB or lightheadedness with hypotension: Unknown Has patient had a PCN reaction causing severe rash involving mucus membranes or skin necrosis: No Has patient had a PCN reaction that required hospitalization No Has patient had a PCN reaction occurring within the last 10 years: No If all of the above answers are "NO", then may proceed with Cephalosporin use.    Social History   Socioeconomic History  . Marital status: Married    Spouse name: Not on file  . Number of children: 3  . Years of education: Not on file  .  Highest education level: Not on file  Occupational History    Comment: teacher, retired  Tobacco Use  . Smoking status: Never Smoker  . Smokeless tobacco: Never Used  Substance and Sexual Activity  . Alcohol use: Yes    Alcohol/week: 1.0 standard drink    Types: 1 Glasses of wine per week    Comment: 1-2 beers/wine in evening  . Drug use: No  . Sexual activity: Not on file    Comment: Married since 2005  Other Topics Concern  . Not on file  Social History Narrative  . Not on file   Social Determinants of Health   Financial Resource Strain:   . Difficulty of Paying Living Expenses: Not on file  Food Insecurity:   . Worried About Charity fundraiser in the Last Year: Not on file  . Ran Out of Food in the Last Year: Not on file  Transportation Needs:   . Lack of Transportation (Medical): Not on file  . Lack of Transportation (Non-Medical): Not on file  Physical Activity:   . Days of  Exercise per Week: Not on file  . Minutes of Exercise per Session: Not on file  Stress:   . Feeling of Stress : Not on file  Social Connections:   . Frequency of Communication with Friends and Family: Not on file  . Frequency of Social Gatherings with Friends and Family: Not on file  . Attends Religious Services: Not on file  . Active Member of Clubs or Organizations: Not on file  . Attends Archivist Meetings: Not on file  . Marital Status: Not on file  Intimate Partner Violence:   . Fear of Current or Ex-Partner: Not on file  . Emotionally Abused: Not on file  . Physically Abused: Not on file  . Sexually Abused: Not on file     Review of Systems  All other systems reviewed and are negative.      Objective:   Physical Exam Vitals reviewed.  Cardiovascular:     Rate and Rhythm: Normal rate and regular rhythm.     Heart sounds: Normal heart sounds.  Pulmonary:     Effort: Pulmonary effort is normal. No respiratory distress.     Breath sounds: Normal breath sounds. No wheezing or rales.  Abdominal:     General: Bowel sounds are normal. There is no distension.     Palpations: Abdomen is soft.     Tenderness: There is no abdominal tenderness. There is no rebound.  Neurological:     Mental Status: She is alert and oriented to person, place, and time.     Cranial Nerves: No cranial nerve deficit.     Motor: No abnormal muscle tone.     Coordination: Coordination normal.     Deep Tendon Reflexes: Reflexes are normal and symmetric.  Psychiatric:        Behavior: Behavior normal.        Thought Content: Thought content normal.        Judgment: Judgment normal.           Assessment & Plan:   History of stroke  Essential hypertension  Osteoporosis, unspecified osteoporosis type, unspecified pathological fracture presence  Partial symptomatic epilepsy with complex partial seizures, not intractable, without status epilepticus ([place]) Patient has had her Covid  shot.  She has had her flu shot.  I will schedule her for a bone density test.  Her lab work is outstanding.  I believe is perfectly reasonable  to stop her aspirin.  Her stroke was hemorrhagic and therefore antiplatelet therapy is not indicated.  I did recommend calcium 1200 mg a day and vitamin D 1000 units a day.  Also recommended that she receive her booster on her Covid vaccination in November.

## 2020-06-11 ENCOUNTER — Other Ambulatory Visit: Payer: Self-pay | Admitting: Family Medicine

## 2020-06-24 ENCOUNTER — Other Ambulatory Visit: Payer: Self-pay | Admitting: Family Medicine

## 2020-06-24 DIAGNOSIS — R569 Unspecified convulsions: Secondary | ICD-10-CM

## 2020-06-24 NOTE — Telephone Encounter (Signed)
Last office visit -  06/02/20 Last refill - 05/11/20

## 2020-07-06 DIAGNOSIS — Z1231 Encounter for screening mammogram for malignant neoplasm of breast: Secondary | ICD-10-CM | POA: Diagnosis not present

## 2020-07-09 DIAGNOSIS — R928 Other abnormal and inconclusive findings on diagnostic imaging of breast: Secondary | ICD-10-CM | POA: Diagnosis not present

## 2020-07-22 DIAGNOSIS — D0512 Intraductal carcinoma in situ of left breast: Secondary | ICD-10-CM | POA: Diagnosis not present

## 2020-07-22 DIAGNOSIS — R921 Mammographic calcification found on diagnostic imaging of breast: Secondary | ICD-10-CM | POA: Diagnosis not present

## 2020-07-23 ENCOUNTER — Encounter: Payer: Self-pay | Admitting: *Deleted

## 2020-07-23 DIAGNOSIS — D0512 Intraductal carcinoma in situ of left breast: Secondary | ICD-10-CM | POA: Insufficient documentation

## 2020-07-24 ENCOUNTER — Encounter: Payer: Self-pay | Admitting: Family Medicine

## 2020-07-28 NOTE — Progress Notes (Signed)
[place]  [phone] [phone]     ID: Cynthia [name] DOB: 1946-07-23  MR#: [id number]  LDJ#:[id number]  Patient Care Team: Susy Frizzle, MD as PCP - General (Family Medicine) Mauro Kaufmann, RN as Oncology Nurse Navigator Rockwell Germany, RN as Oncology Nurse Navigator Stark Klein, MD as Consulting Physician (General Surgery) Kijuan Gallicchio, Virgie Dad, MD as Consulting Physician (Oncology) Eppie Gibson, MD as Attending Physician (Radiation Oncology) Juluis Rainier as Consulting Physician (Optometry) Garvin Fila, MD as Consulting Physician (Neurology) Carmela Rima, DMD as Consulting Physician (Dentistry) Chauncey Cruel, MD OTHER MD:  CHIEF COMPLAINT:   CURRENT TREATMENT: definitive surgery pending   HISTORY OF CURRENT ILLNESS: Cynthia [name] had routine screening mammography on 07/06/2020 showing calcifications in the left breast. She underwent left diagnostic mammography with tomography at [facility] on 07/09/2020 showing: breast density category B; 1 cm grouped calcifications in left breast at 12 o'clock.  Accordingly on 07/22/2020 she proceeded to biopsy of the left breast area in question. The pathology from this procedure ([id number]) showed: ductal carcinoma in situ, high grade, with central necrosis and calcifications. Prognostic indicators significant for: estrogen receptor, 20% positive with weak staining intensity and progesterone receptor, 0% negative.  The patient's subsequent history is as detailed below.   INTERVAL HISTORY: Cynthia [name] was evaluated in the multidisciplinary breast cancer clinic on 07/29/2020 . Her case was also presented at the multidisciplinary breast cancer conference on the same day. At that time a preliminary plan was proposed: breast conserving surgery, adjuvant radiation   REVIEW OF SYSTEMS: There were no specific symptoms leading to the original mammogram, which was routinely scheduled. On  the provided questionnaire, Cynthia [name] reports loss of sleep causing fatigue that affects her activities, lower back pain, cramping/arthritis in her legs and hand, runny nose, shortness of breath with walking, cough, heartburn, abdominal pain, dribbling urine, skin rash, easy bruising, numbness, forgetfulness, anxiety, and depression. The patient denies unusual headaches, visual changes, nausea, vomiting, stiff neck, dizziness, or gait imbalance. There has been no cough, phlegm production, or pleurisy, no chest pain or pressure, and no change in bowel or bladder habits. The patient denies fever, rash, bleeding, unexplained fatigue or unexplained weight loss. A detailed review of systems was otherwise entirely negative.   COVID 19 VACCINATION STATUS: Moderna x2, most recently March 2021   PAST MEDICAL HISTORY: Past Medical History:  Diagnosis Date  . Breast cancer ([place])   . Colon polyps   . Depression    history of  . Diverticulitis   . Osteoporosis   . Seizures ([place])   . Stroke [facility])     PAST SURGICAL HISTORY: Past Surgical History:  Procedure Laterality Date  . IR GENERIC HISTORICAL  08/05/2016   IR ANGIO VERTEBRAL SEL VERTEBRAL UNI R MOD SED 08/05/2016 Luanne Bras, MD [facility]-INTERV RAD  . IR GENERIC HISTORICAL  08/05/2016   IR ANGIO INTRA EXTRACRAN SEL COM CAROTID INNOMINATE BILAT MOD SED 08/05/2016 Luanne Bras, MD [facility]  . IR GENERIC HISTORICAL  08/05/2016   IR ANGIO VERTEBRAL SEL SUBCLAVIAN INNOMINATE UNI L MOD SED 08/05/2016 Luanne Bras, MD [facility]  . TONSILLECTOMY    . TUBAL LIGATION      FAMILY HISTORY: Family History  Problem Relation Age of Onset  . Heart disease Mother   . Cancer Mother        lung  . Stroke Paternal Grandmother   . Stomach cancer Maternal Grandfather    Her father died at age 101  from alcoholism. Her mother died at age 62 from lung cancer. Cynthia [name] has one brother and had two sisters (one is deceased). Aside from the cancer  in her mother, she also reports stomach cancer in her maternal grandfather at age 102. There is no family history of breast or ovarian cancer to her knowledge.   GYNECOLOGIC HISTORY:  No LMP recorded. Patient is postmenopausal. Menarche: 74 years old Age at first live birth: 74 years old [name] P 2 LMP 2003-11-16 Contraceptive: used from "1970's to early 1980's" HRT never used  Hysterectomy? no BSO? 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 Her first husband passed away in November 15, 2000. Her second and current husband Shanon Brow is a retired Network engineer. Son Abe People, age 47, works for [employer] (a Retail banker) and [employer] in [place], [place]. Son Ferol Luz, age 12, is a Freight forwarder at [employer] in [place] and lives in [place]. Cristal has a total of 5 grandchildren-- son Catalina Antigua has two sons, and husband Shanon Brow has 3 granddaughters.    ADVANCED DIRECTIVES: At the 07/29/2020 visit the patient was given the appropriate documents to complete and notarize at her discretion. She tells me she intends to name her son Catalina Antigua as Economist.  HEALTH MAINTENANCE: Social History   Tobacco Use  . Smoking status: Never Smoker  . Smokeless tobacco: Never Used  Substance Use Topics  . Alcohol use: Yes    Alcohol/week: 1.0 standard drink    Types: 1 Glasses of wine per week    Comment: 1-2 beers/wine in evening  . Drug use: Yes    Types: Marijuana     Colonoscopy: yes, most recent on file from 11/15/13  PAP: Nov 15, 2013?  Bone density: date unknown, most recent on file from 1999/11/16   Allergies  Allergen Reactions  . Other Anaphylaxis    FIRE ANTS Insect stings / bites  . Acyclovir And Related   . Codeine     Syncope   . Penicillins Rash    Has patient had a PCN reaction causing immediate rash, facial/tongue/throat swelling, SOB or lightheadedness with hypotension: Unknown Has patient had a PCN reaction causing severe rash involving mucus  membranes or skin necrosis: No Has patient had a PCN reaction that required hospitalization No Has patient had a PCN reaction occurring within the last 10 years: No If all of the above answers are "NO", then may proceed with Cephalosporin use.     Current Outpatient Medications  Medication Sig Dispense Refill  . alendronate (FOSAMAX) 70 MG tablet TAKE 1 TABLET BY MOUTH EVERY 7 DAYS. TAKE WITH FULL GLASS OF WATER ON AN EMPTY STOMACH 12 tablet 3  . ALPRAZolam (XANAX) 0.5 MG tablet TAKE 1/2 TABLET BY MOUTH 3 TIMES A DAY AS NEEDED FOR ANXIETY 45 tablet 0  . aspirin EC 81 MG tablet Take 81 mg by mouth daily.    . Calcium Carbonate-Vitamin D3 (CALCIUM 600-D) 600-400 MG-UNIT TABS Take 1 tablet by mouth 2 (two) times daily.    . ciprofloxacin (CIPRO) 500 MG tablet Take 1 tablet (500 mg total) by mouth 2 (two) times daily. 14 tablet 0  . ciprofloxacin (CIPRO) 500 MG tablet Take 1 tablet (500 mg total) by mouth 2 (two) times daily. 20 tablet 0  . EPIPEN 2-PAK 0.3 MG/0.3ML SOAJ injection USE AS DIRECTED 2 each 1  . famotidine (PEPCID) 20 MG tablet TAKE 1 TABLET BY MOUTH TWICE A DAY 180 tablet 2  .  ferrous sulfate 325 (65 FE) MG tablet Take 325 mg by mouth daily.     Marland Kitchen levETIRAcetam (KEPPRA XR) 500 MG 24 hr tablet TAKE 1 TABLET BY MOUTH EVERY DAY 90 tablet 2  . loratadine (CLARITIN) 10 MG tablet Take 10 mg by mouth daily.    Marland Kitchen losartan (COZAAR) 100 MG tablet TAKE 1 TABLET BY MOUTH EVERY DAY 90 tablet 3  . meloxicam (MOBIC) 15 MG tablet Take 1 tablet (15 mg total) by mouth daily. 30 tablet 0  . zinc gluconate 50 MG tablet Take 50 mg by mouth daily.     No current facility-administered medications for this visit.    OBJECTIVE: White woman in no acute distress  Vitals:   07/29/20 1253  BP: 136/67  Pulse: 79  Resp: 18  Temp: 98 F (36.7 C)  SpO2: 99%     Body mass index is 24.26 kg/m.   Wt Readings from Last 3 Encounters:  07/29/20 150 lb 4.8 oz (68.2 kg)  06/02/20 148 lb (67.1 kg)    01/24/20 157 lb (71.2 kg)      ECOG FS:1 - Symptomatic but completely ambulatory  Ocular: Sclerae unicteric, pupils round and equal Ear-nose-throat: Wearing a mask Lymphatic: No cervical or supraclavicular adenopathy Lungs no rales or rhonchi Heart regular rate and rhythm Abd soft, nontender, positive bowel sounds MSK no focal spinal tenderness, no joint edema Neuro: non-focal, well-oriented, appropriate affect Breasts: the right breast is unremarkable; the left breast is s/p recent biopsy. There is no palpable mass. Both axillae are benign   LAB RESULTS:  CMP     Component Value Date/Time   NA 142 07/29/2020 1234   K 4.3 07/29/2020 1234   CL 108 07/29/2020 1234   CO2 28 07/29/2020 1234   GLUCOSE 91 07/29/2020 1234   BUN 15 07/29/2020 1234   CREATININE 0.74 07/29/2020 1234   CREATININE 0.83 05/25/2020 1111   CALCIUM 8.9 07/29/2020 1234   PROT 6.5 07/29/2020 1234   ALBUMIN 3.6 07/29/2020 1234   AST 20 07/29/2020 1234   ALT 14 07/29/2020 1234   ALKPHOS 60 07/29/2020 1234   BILITOT 0.5 07/29/2020 1234   GFRNONAA >60 07/29/2020 1234   GFRNONAA 70 05/25/2020 1111   GFRAA 82 05/25/2020 1111    No results found for: TOTALPROTELP, ALBUMINELP, A1GS, A2GS, BETS, BETA2SER, GAMS, MSPIKE, SPEI  Lab Results  Component Value Date   WBC 5.7 07/29/2020   NEUTROABS 3.5 07/29/2020   HGB 11.9 (L) 07/29/2020   HCT 35.5 (L) 07/29/2020   MCV 91.3 07/29/2020   PLT 288 07/29/2020    No results found for: LABCA2  No components found for: PIRJJO841  No results for input(s): INR in the last 168 hours.  No results found for: LABCA2  No results found for: YSA630  No results found for: ZSW109  No results found for: NAT557  No results found for: CA2729  No components found for: HGQUANT  No results found for: CEA1 / No results found for: CEA1   No results found for: AFPTUMOR  No results found for: CHROMOGRNA  No results found for: KPAFRELGTCHN, LAMBDASER,  KAPLAMBRATIO (kappa/lambda light chains)  No results found for: HGBA, HGBA2QUANT, HGBFQUANT, HGBSQUAN (Hemoglobinopathy evaluation)   No results found for: LDH  No results found for: IRON, TIBC, IRONPCTSAT (Iron and TIBC)  No results found for: FERRITIN  Urinalysis    Component Value Date/Time   COLORURINE DARK YELLOW 01/24/2020 1312   APPEARANCEUR CLEAR 01/24/2020 1312   LABSPEC 1.015  01/24/2020 1312   PHURINE 5.5 01/24/2020 [place] 01/24/2020 1312   HGBUR TRACE (A) 01/24/2020 [place] 05/11/2017 [place] 01/24/2020 1312   PROTEINUR NEGATIVE 01/24/2020 1312   UROBILINOGEN 0.2 01/08/2013 1533   NITRITE NEGATIVE 01/24/2020 1312   LEUKOCYTESUR TRACE (A) 01/24/2020 1312     STUDIES: No results found.   ELIGIBLE FOR AVAILABLE RESEARCH PROTOCOL: no  ASSESSMENT: 74 y.o. [place] woman s/p Left breast biopsy 07/22/2020 for ductal carcinoma in situ, grade 3, functionally estrogen and progesterone receptor positive.  (1) definitive surgery pending  (2) adjuvant radiation to follow  (3) do not recommend anti-estrogens given minimal estrogen receptor positivity  PLAN: I met today with Cynthia [name] to review her new diagnosis. Specifically we discussed the biology of her breast cancer, its diagnosis, staging, treatment  options and prognosis. Cynthia [name] understands that in noninvasive ductal carcinoma, also called ductal carcinoma in situ ("DCIS") the breast cancer cells remain trapped in the ducts were they started. They cannot travel to a vital organ. For that reason these cancers in themselves are not life-threatening.  If the whole breast is removed then all the ducts are removed and since the cancer cells are trapped in the ducts, the cure rate with mastectomy for noninvasive breast cancer is approximately 99%. Nevertheless we recommend lumpectomy, because there is no survival advantage to mastectomy and because the cosmetic  result is generally superior with breast conservation.  Since the patient is keeping her breasts, there will be some risk of recurrence. The recurrence can only be in the same breast since, again, the cells are trapped in the ducts. There is no connection from one breast to the other. The risk of local recurrence is cut by more than half with radiation, which is standard in this situation.  In estrogen receptor positive cancers anti-estrogens can also be considered. However, the weak estrogen receptor positivity and progesterone receptor negativity tells [place] she is unlikely to benefit from anti estrogens, while the medications can have significant side effects including abnormal clotting and worsening osteoporosis.  Accordingly the overall plan is for surgery, followed by radiation.  I have very little to contribute to Cynthia [name]'s case, but offered her a visit with me in about 6 months in case she has other concerns or questions to clear. She may cancel the visit at her discretion if she has no special questions or needs at that time.  Virgie Dad. Nadean Montanaro, MD 07/29/2020 6:10 PM Medical Oncology and Hematology [facility] [place], [place] [address] Tel. [phone]    Fax. [phone]   This document serves as a record of services personally performed by Lurline Del, MD. It was created on his behalf by Wilburn Mylar, a trained medical scribe. The creation of this record is based on the scribe's personal observations and the provider's statements to them.   I, Lurline Del MD, have reviewed the above documentation for accuracy and completeness, and I agree with the above.   *Total Encounter Time as defined by the Centers for Medicare and Medicaid Services includes, in addition to the face-to-face time of a patient visit (documented in the note above) non-face-to-face time: obtaining and reviewing outside history, ordering and reviewing medications, tests or  procedures, care coordination (communications with other health care professionals or caregivers) and documentation in the medical record.

## 2020-07-29 ENCOUNTER — Ambulatory Visit: Payer: Medicare PPO | Admitting: Physical Therapy

## 2020-07-29 ENCOUNTER — Inpatient Hospital Stay: Payer: Medicare PPO | Attending: Oncology | Admitting: Oncology

## 2020-07-29 ENCOUNTER — Inpatient Hospital Stay: Payer: Medicare PPO

## 2020-07-29 ENCOUNTER — Encounter: Payer: Self-pay | Admitting: General Practice

## 2020-07-29 ENCOUNTER — Encounter: Payer: Self-pay | Admitting: Oncology

## 2020-07-29 ENCOUNTER — Encounter: Payer: Self-pay | Admitting: Radiation Oncology

## 2020-07-29 ENCOUNTER — Ambulatory Visit
Admission: RE | Admit: 2020-07-29 | Discharge: 2020-07-29 | Disposition: A | Discharge status: Home / Self Care | Payer: Medicare PPO | Source: Ambulatory Visit | Attending: Radiation Oncology | Admitting: Radiation Oncology

## 2020-07-29 ENCOUNTER — Other Ambulatory Visit: Payer: Self-pay

## 2020-07-29 VITALS — BP 136/67 | HR 79 | Temp 98.0°F | Resp 18 | Ht 66.0 in | Wt 150.3 lb

## 2020-07-29 DIAGNOSIS — C50412 Malignant neoplasm of upper-outer quadrant of left female breast: Secondary | ICD-10-CM | POA: Diagnosis not present

## 2020-07-29 DIAGNOSIS — Z79899 Other long term (current) drug therapy: Secondary | ICD-10-CM | POA: Insufficient documentation

## 2020-07-29 DIAGNOSIS — Z823 Family history of stroke: Secondary | ICD-10-CM | POA: Insufficient documentation

## 2020-07-29 DIAGNOSIS — Z6372 Alcoholism and drug addiction in family: Secondary | ICD-10-CM | POA: Insufficient documentation

## 2020-07-29 DIAGNOSIS — C50812 Malignant neoplasm of overlapping sites of left female breast: Secondary | ICD-10-CM

## 2020-07-29 DIAGNOSIS — R5383 Other fatigue: Secondary | ICD-10-CM | POA: Insufficient documentation

## 2020-07-29 DIAGNOSIS — Z8673 Personal history of transient ischemic attack (TIA), and cerebral infarction without residual deficits: Secondary | ICD-10-CM | POA: Diagnosis not present

## 2020-07-29 DIAGNOSIS — Z8719 Personal history of other diseases of the digestive system: Secondary | ICD-10-CM | POA: Insufficient documentation

## 2020-07-29 DIAGNOSIS — Z170 Estrogen receptor positive status [ER+]: Secondary | ICD-10-CM | POA: Insufficient documentation

## 2020-07-29 DIAGNOSIS — Z800 Family history of malignant neoplasm of digestive organs: Secondary | ICD-10-CM | POA: Insufficient documentation

## 2020-07-29 DIAGNOSIS — M1990 Unspecified osteoarthritis, unspecified site: Secondary | ICD-10-CM

## 2020-07-29 DIAGNOSIS — Z885 Allergy status to narcotic agent status: Secondary | ICD-10-CM | POA: Diagnosis not present

## 2020-07-29 DIAGNOSIS — D0512 Intraductal carcinoma in situ of left breast: Secondary | ICD-10-CM

## 2020-07-29 DIAGNOSIS — Z8249 Family history of ischemic heart disease and other diseases of the circulatory system: Secondary | ICD-10-CM | POA: Diagnosis not present

## 2020-07-29 DIAGNOSIS — M545 Low back pain, unspecified: Secondary | ICD-10-CM | POA: Insufficient documentation

## 2020-07-29 DIAGNOSIS — Z880 Allergy status to penicillin: Secondary | ICD-10-CM | POA: Insufficient documentation

## 2020-07-29 DIAGNOSIS — Z808 Family history of malignant neoplasm of other organs or systems: Secondary | ICD-10-CM | POA: Diagnosis not present

## 2020-07-29 DIAGNOSIS — Z801 Family history of malignant neoplasm of trachea, bronchus and lung: Secondary | ICD-10-CM | POA: Insufficient documentation

## 2020-07-29 DIAGNOSIS — S069X0S Unspecified intracranial injury without loss of consciousness, sequela: Secondary | ICD-10-CM

## 2020-07-29 DIAGNOSIS — G40209 Localization-related (focal) (partial) symptomatic epilepsy and epileptic syndromes with complex partial seizures, not intractable, without status epilepticus: Secondary | ICD-10-CM

## 2020-07-29 DIAGNOSIS — R0602 Shortness of breath: Secondary | ICD-10-CM | POA: Insufficient documentation

## 2020-07-29 LAB — CMP (CANCER CENTER ONLY)
ALT: 14 U/L (ref 0–44)
AST: 20 U/L (ref 15–41)
Albumin: 3.6 g/dL (ref 3.5–5.0)
Alkaline Phosphatase: 60 U/L (ref 38–126)
Anion gap: 6 (ref 5–15)
BUN: 15 mg/dL (ref 8–23)
CO2: 28 mmol/L (ref 22–32)
Calcium: 8.9 mg/dL (ref 8.9–10.3)
Chloride: 108 mmol/L (ref 98–111)
Creatinine: 0.74 mg/dL (ref 0.44–1.00)
GFR, Estimated: >60 mL/min (ref >60)
Glucose, Bld: 91 mg/dL (ref 70–99)
Potassium: 4.3 mmol/L (ref 3.5–5.1)
Sodium: 142 mmol/L (ref 135–145)
Total Bilirubin: 0.5 mg/dL (ref 0.3–1.2)
Total Protein: 6.5 g/dL (ref 6.5–8.1)

## 2020-07-29 LAB — CBC WITH DIFFERENTIAL (CANCER CENTER ONLY)
Abs Immature Granulocytes: 0.01 10*3/uL (ref 0.00–0.07)
Basophils Absolute: 0 10*3/uL (ref 0.0–0.1)
Basophils Relative: 0 %
Eosinophils Absolute: 0 10*3/uL (ref 0.0–0.5)
Eosinophils Relative: 0 %
HCT: 35.5 % — ABNORMAL LOW (ref 36.0–46.0)
Hemoglobin: 11.9 g/dL — ABNORMAL LOW (ref 12.0–15.0)
Immature Granulocytes: 0 %
Lymphocytes Relative: 29 %
Lymphs Abs: 1.7 10*3/uL (ref 0.7–4.0)
MCH: 30.6 pg (ref 26.0–34.0)
MCHC: 33.5 g/dL (ref 30.0–36.0)
MCV: 91.3 fL (ref 80.0–100.0)
Monocytes Absolute: 0.4 10*3/uL (ref 0.1–1.0)
Monocytes Relative: 8 %
Neutro Abs: 3.5 10*3/uL (ref 1.7–7.7)
Neutrophils Relative %: 63 %
Platelet Count: 288 10*3/uL (ref 150–400)
RBC: 3.89 MIL/uL (ref 3.87–5.11)
RDW: 13 % (ref 11.5–15.5)
WBC Count: 5.7 10*3/uL (ref 4.0–10.5)
nRBC: 0 % (ref 0.0–0.2)

## 2020-07-29 LAB — GENETIC SCREENING ORDER

## 2020-07-29 NOTE — Progress Notes (Signed)
[facility] Psychosocial Distress Screening Spiritual Care  Met with Raeli in [facility] to introduce [place] team/resources, reviewing distress screen per protocol.  The patient scored a 7 on the Psychosocial Distress Thermometer which indicates severe distress. Also assessed for distress and other psychosocial needs.   ONCBCN DISTRESS SCREENING 07/29/2020  Screening Type Initial Screening  Distress experienced in past week (1-10) 7  Practical problem type Insurance  Family Problem type Partner  Emotional problem type Depression;Nervousness/Anxiety;Adjusting to illness;Adjusting to appearance changes  Spiritual/Religous concerns type Facing my mortality  Information Concerns Type Lack of info about diagnosis;Lack of info about treatment;Lack of info about complementary therapy choices;Lack of info about maintaining fitness  Physical Problem type Sleep/insomnia;Tingling hands/feet;Skin dry/itchy  Referral to clinical social work Yes  Referral to support programs Yes   Ms Fuhriman shared concerns unrelated to her diagnosis that social work may be able to assist with, so I will place a referral with Sharyn Lull Zavala/LCSW for follow-up support.   Follow up needed: Yes.  We also plan to follow up by phone for a pastoral check-in.   [place], [place], [facility] Pager [phone] Voicemail [phone]

## 2020-07-29 NOTE — Progress Notes (Signed)
Radiation Oncology         [phone] ________________________________  Initial Outpatient Consultation  Name: Cynthia [name] MRN: [id number]  Date: 07/29/2020  DOB: 02-Oct-1945  KZ:[id number], Cammie Mcgee, MD  Stark Klein, MD   REFERRING PHYSICIAN: Stark Klein, MD  DIAGNOSIS:    ICD-10-CM   1. Ductal carcinoma in situ (DCIS) of left breast  D05.12     Cancer Staging Ductal carcinoma in situ (DCIS) of left breast Staging form: Breast, AJCC 8th Edition - Clinical stage from 07/29/2020: Stage 0 (cTis (DCIS), cN0, cM0, ER+, PR-) - Unsigned   CHIEF COMPLAINT: Here to discuss management of left breast DCIS  HISTORY OF PRESENT ILLNESS::Cynthia [name] is a 74 y.o. female who presented with left breast abnormality on the following imaging: bilateral screening mammogram on the date of 07/06/2020.  No symptoms were reported at that time. Unilateral diagnostic mammogram on 07/09/2020 revealed suspicious 1 cm grouped punctate calcifications in the left breast. Biopsy showed DCIS.  ER status: 20% weak; PR status neg; high grade.  She is in her usual state of health otherwise.  PREVIOUS RADIATION THERAPY: No  PAST MEDICAL HISTORY:  has a past medical history of Breast cancer ([place]), Colon polyps, Depression, Diverticulitis, Osteoporosis, Seizures ([place]), and Stroke ([place]).    PAST SURGICAL HISTORY: Past Surgical History:  Procedure Laterality Date  . IR GENERIC HISTORICAL  08/05/2016   IR ANGIO VERTEBRAL SEL VERTEBRAL UNI R MOD SED 08/05/2016 Luanne Bras, MD [facility]  . IR GENERIC HISTORICAL  08/05/2016   IR ANGIO INTRA EXTRACRAN SEL COM CAROTID INNOMINATE BILAT MOD SED 08/05/2016 Luanne Bras, MD [facility]  . IR GENERIC HISTORICAL  08/05/2016   IR ANGIO VERTEBRAL SEL SUBCLAVIAN INNOMINATE UNI L MOD SED 08/05/2016 Luanne Bras, MD [facility]  . TONSILLECTOMY    . TUBAL LIGATION      FAMILY HISTORY: family history includes Cancer in her mother; Heart  disease in her mother; Stomach cancer in her maternal grandfather; Stroke in her paternal grandmother.  SOCIAL HISTORY:  reports that she has never smoked. She has never used smokeless tobacco. She reports current alcohol use of about 1.0 standard drink of alcohol per week. She reports current drug use. Drug: Marijuana.  ALLERGIES: Other, Acyclovir and related, Codeine, and Penicillins  MEDICATIONS:  Current Outpatient Medications  Medication Sig Dispense Refill  . alendronate (FOSAMAX) 70 MG tablet TAKE 1 TABLET BY MOUTH EVERY 7 DAYS. TAKE WITH FULL GLASS OF WATER ON AN EMPTY STOMACH 12 tablet 3  . ALPRAZolam (XANAX) 0.5 MG tablet TAKE 1/2 TABLET BY MOUTH 3 TIMES A DAY AS NEEDED FOR ANXIETY 45 tablet 0  . aspirin EC 81 MG tablet Take 81 mg by mouth daily.    . Calcium Carbonate-Vitamin D3 (CALCIUM 600-D) 600-400 MG-UNIT TABS Take 1 tablet by mouth 2 (two) times daily.    . ciprofloxacin (CIPRO) 500 MG tablet Take 1 tablet (500 mg total) by mouth 2 (two) times daily. 14 tablet 0  . ciprofloxacin (CIPRO) 500 MG tablet Take 1 tablet (500 mg total) by mouth 2 (two) times daily. 20 tablet 0  . EPIPEN 2-PAK 0.3 MG/0.3ML SOAJ injection USE AS DIRECTED 2 each 1  . famotidine (PEPCID) 20 MG tablet TAKE 1 TABLET BY MOUTH TWICE A DAY 180 tablet 2  . ferrous sulfate 325 (65 FE) MG tablet Take 325 mg by mouth daily.     Marland Kitchen levETIRAcetam (KEPPRA XR) 500 MG 24 hr tablet TAKE 1 TABLET BY MOUTH EVERY DAY 90  tablet 2  . loratadine (CLARITIN) 10 MG tablet Take 10 mg by mouth daily.    Marland Kitchen losartan (COZAAR) 100 MG tablet TAKE 1 TABLET BY MOUTH EVERY DAY 90 tablet 3  . meloxicam (MOBIC) 15 MG tablet Take 1 tablet (15 mg total) by mouth daily. 30 tablet 0  . zinc gluconate 50 MG tablet Take 50 mg by mouth daily.     No current facility-administered medications for this encounter.    REVIEW OF SYSTEMS: As above    PHYSICAL EXAM:  vitals were not taken for this visit.   General: Alert and oriented, in no acute  distress HEENT: Head is normocephalic.   Heart: Regular in rate and rhythm with no murmurs, rubs, or gallops. Chest: Clear to auscultation bilaterally, with no rhonchi, wheezes, or rales. Musculoskeletal: symmetric strength and muscle tone throughout. Neurologic:  No obvious focalities. Speech is fluent. Coordination is intact. Psychiatric: Judgment and insight are intact. Affect is appropriate. Breasts:  No palpable masses appreciated in the breasts or axillae bilaterally.   ECOG = 0  0 - Asymptomatic (Fully active, able to carry on all predisease activities without restriction)  1 - Symptomatic but completely ambulatory (Restricted in physically strenuous activity but ambulatory and able to carry out work of a light or sedentary nature. For example, light housework, office work)  2 - Symptomatic, <50% in bed during the day (Ambulatory and capable of all self care but unable to carry out any work activities. Up and about more than 50% of waking hours)  3 - Symptomatic, >50% in bed, but not bedbound (Capable of only limited self-care, confined to bed or chair 50% or more of waking hours)  4 - Bedbound (Completely disabled. Cannot carry on any self-care. Totally confined to bed or chair)  5 - Death   Eustace Pen MM, Creech RH, Tormey DC, et al. [id number]). "Toxicity and response criteria of the [facility]". [place] Oncol. 5 (6): 649-55   LABORATORY DATA:  Lab Results  Component Value Date   WBC 5.7 07/29/2020   HGB 11.9 (L) 07/29/2020   HCT 35.5 (L) 07/29/2020   MCV 91.3 07/29/2020   PLT 288 07/29/2020   CMP     Component Value Date/Time   NA 142 07/29/2020 1234   K 4.3 07/29/2020 1234   CL 108 07/29/2020 1234   CO2 28 07/29/2020 1234   GLUCOSE 91 07/29/2020 1234   BUN 15 07/29/2020 1234   CREATININE 0.74 07/29/2020 1234   CREATININE 0.83 05/25/2020 1111   CALCIUM 8.9 07/29/2020 1234   PROT 6.5 07/29/2020 1234   ALBUMIN 3.6 07/29/2020 1234   AST 20  07/29/2020 1234   ALT 14 07/29/2020 1234   ALKPHOS 60 07/29/2020 1234   BILITOT 0.5 07/29/2020 1234   GFRNONAA >60 07/29/2020 1234   GFRNONAA 70 05/25/2020 1111   GFRAA 82 05/25/2020 1111         RADIOGRAPHY: as above    IMPRESSION/PLAN: Left breast DCIS  The patient was seen after tumor board discussion  It was a pleasure meeting the patient today. We discussed the risks, benefits, and side effects of radiotherapy. I recommend radiotherapy to the left breast to reduce her risk of locoregional recurrence by half.  We discussed that radiation would take approximately 4 weeks to complete and that I would give the patient a few weeks to heal following surgery before starting treatment planning.  We spoke about acute effects including skin irritation and fatigue as well  as much less common late effects including internal organ injury or irritation. We spoke about the latest technology that is used to minimize the risk of late effects for patients undergoing radiotherapy to the breast or chest wall. No guarantees of treatment were given. The patient is enthusiastic about proceeding with treatment. I look forward to participating in the patient's care.  I will await her referral back to me for postoperative follow-up and eventual CT simulation/treatment planning.  On date of service, in total, I spent 38 minutes on this encounter. Patient was seen in person.   __________________________________________   Eppie Gibson, MD  This document serves as a record of services personally performed by Eppie Gibson, MD. It was created on his behalf by Clerance Lav, a trained medical scribe. The creation of this record is based on the scribe's personal observations and the provider's statements to them. This document has been checked and approved by the attending provider.

## 2020-07-30 ENCOUNTER — Encounter: Payer: Self-pay | Admitting: Licensed Clinical Social Worker

## 2020-07-30 ENCOUNTER — Encounter: Payer: Self-pay | Admitting: *Deleted

## 2020-07-30 NOTE — Progress Notes (Signed)
[place] Work  Clinical Social Work was referred by L. [place] for assistance with resources for home issues.  Clinical Social Worker contacted patient by phone  to offer support and assess for needs.  Patient feels safe currently, but looking for someone to talk to about what she is dealing with currently at home. History of substance abuse in people close to her. CSW provided local crisis numbers and centers [facility], [facility]) that can assist.   Provided direct contact information for this CSW should patient need any further resources or support.    [place], [place] Worker [employer]

## 2020-07-31 ENCOUNTER — Ambulatory Visit: Payer: Medicare PPO | Attending: Internal Medicine

## 2020-07-31 ENCOUNTER — Telehealth: Payer: Self-pay | Admitting: Oncology

## 2020-07-31 DIAGNOSIS — Z23 Encounter for immunization: Secondary | ICD-10-CM

## 2020-07-31 NOTE — Progress Notes (Signed)
   Covid-19 Vaccination Clinic  Name:  Cynthia [name]    MRN: [id number] DOB: Jan 27, 1946  07/31/2020  [name] was observed post Covid-19 immunization for 15 minutes without incident. She was provided with Vaccine Information Sheet and instruction to access the V-Safe system.   [name] was instructed to call 911 with any severe reactions post vaccine: Marland Kitchen Difficulty breathing  . Swelling of face and throat  . A fast heartbeat  . A bad rash all over body  . Dizziness and weakness   Immunizations Administered    No immunizations on file.

## 2020-07-31 NOTE — Telephone Encounter (Signed)
Scheduled appts per 11/17 los. Pt confirmed appt date and time.

## 2020-08-01 ENCOUNTER — Other Ambulatory Visit: Payer: Self-pay | Admitting: Family Medicine

## 2020-08-03 ENCOUNTER — Telehealth: Payer: Self-pay | Admitting: *Deleted

## 2020-08-03 ENCOUNTER — Encounter: Payer: Self-pay | Admitting: *Deleted

## 2020-08-03 DIAGNOSIS — D0512 Intraductal carcinoma in situ of left breast: Secondary | ICD-10-CM

## 2020-08-03 NOTE — Telephone Encounter (Signed)
Called pt concerning bmdc from 11.17.21. Left vm with contact information for question or needs.

## 2020-08-03 NOTE — Telephone Encounter (Signed)
Please Advise

## 2020-08-03 NOTE — Telephone Encounter (Signed)
Spoke to pt concerning [place] from 11.17.21. Denies questions or concern regarding dx or treatment care plan. Encourage pt to call with needs. Received verbal understanding.

## 2020-08-05 ENCOUNTER — Encounter: Payer: Self-pay | Admitting: General Practice

## 2020-08-05 NOTE — Progress Notes (Signed)
Nutrition  Patient identified by attending Breast Clinic on 07/29/20.  Patient was given nutrition packet by nurse navigator with RD contact information.   Chart reviewed.   74 year old female with breast cancer.  Planning lumpectomy, radiation.    Ht: 66 inches Wt: 150 lb BMI: 24  Patient currently not at nutritional risk.  Please consult RD if changes in nutritional status occurs.  Olsen Mccutchan B. Zenia Resides, [place], [place] Registered Dietitian [phone] (mobile)

## 2020-08-05 NOTE — Progress Notes (Signed)
[place] Spiritual Care Note  Followed up with Cynthia [name] by phone. She is looking forward to the "refreshing break" of going to visit her son and his family in [place] 5-10. At the same time, she reports more anxiety as she wonders whether she is putting herself at risk by taking a later surgery date. She plans to call Dr Marlowe Aschoff office and breast navigator Dawn Stuart/RN to confirm that the timing is safe for her.  We plan to follow up by phone next week as well.   [name], [place], [facility] Pager [phone] Voicemail [phone]

## 2020-08-12 ENCOUNTER — Inpatient Hospital Stay
Admission: RE | Admit: 2020-08-12 | Discharge: 2020-08-12 | Disposition: A | Discharge status: Home / Self Care | Payer: Self-pay | Source: Ambulatory Visit | Attending: Radiation Oncology | Admitting: Radiation Oncology

## 2020-08-12 ENCOUNTER — Other Ambulatory Visit: Payer: Self-pay | Admitting: Radiation Oncology

## 2020-08-12 DIAGNOSIS — D0512 Intraductal carcinoma in situ of left breast: Secondary | ICD-10-CM

## 2020-08-12 DIAGNOSIS — M85852 Other specified disorders of bone density and structure, left thigh: Secondary | ICD-10-CM | POA: Diagnosis not present

## 2020-08-12 DIAGNOSIS — M85851 Other specified disorders of bone density and structure, right thigh: Secondary | ICD-10-CM | POA: Diagnosis not present

## 2020-08-12 LAB — HM DEXA SCAN

## 2020-08-13 ENCOUNTER — Ambulatory Visit: Payer: Medicare PPO | Admitting: Neurology

## 2020-08-13 ENCOUNTER — Encounter: Payer: Self-pay | Admitting: General Practice

## 2020-08-13 ENCOUNTER — Telehealth: Payer: Self-pay | Admitting: *Deleted

## 2020-08-13 ENCOUNTER — Encounter: Payer: Self-pay | Admitting: *Deleted

## 2020-08-13 NOTE — Progress Notes (Signed)
[facility] Spiritual Care Note  Followed up with Cynthia [name] by phone, providing empathic listening and emotional support as she shared and processed updates. She notes that she feel less anxious today than when last we talked, despite the stress associated with packing for her trip (which, she explains, is difficult due to her stroke history). She is actively using her tools to cope with stressors. We plan to follow up by phone after her much-awaited trip to [facility] to visit her son and his family.   [place], [place], [facility] Pager [phone] Voicemail [phone]

## 2020-08-13 NOTE — Telephone Encounter (Signed)
Spoke to pt concerning her dx of dcis and difference between invasive dz and her2.  Discussed tx plan and next steps. Pt did ask if surgery could be moved up.  Informed pt msg would be sent to Dr. Barry Dienes. No further needs voiced.

## 2020-08-18 ENCOUNTER — Encounter: Payer: Self-pay | Admitting: *Deleted

## 2020-08-24 ENCOUNTER — Other Ambulatory Visit: Payer: Self-pay | Admitting: General Surgery

## 2020-08-24 DIAGNOSIS — D0512 Intraductal carcinoma in situ of left breast: Secondary | ICD-10-CM

## 2020-08-24 NOTE — H&P (Signed)
Cynthia [name] Location: [facility] Patient #: [id number] DOB: [birth year] Undefined / Language: Cleophus Molt / Race: White Female   History of Present Illness  The patient is a 74 year old female who presents with breast cancer. Pt is a 74 yo F who presents with screening detected breast cancer. Screening detected calcifications were seen. Diagnostic imaging showed 1 cm of calcifications at 12 o'clock. Core needle biopsy was performed and showed high grade DCIS with necrosis. This was weakly ER+ and PR -. She has no personal or family history of breast cancer. She had a maternal grandfather with stomach cancer in his 45s. She is a retired Pharmacist, hospital.   She had menarche at age 73. She had menopause in her 90s. She is a G2P2 with first child at age 57. She used OCPs for around 10-15 years. She is up to date with colonoscopy. She has had a bone density study.   Images from [facility] were reviewed today along with results.   pathology described above.   CMET and CBC from 07/29/2020 are essentially normal.    Diagnostic Studies History Colonoscopy  5-10 years ago Mammogram  within last year Pap Smear  1-5 years ago  Medication History Medications Reconciled  Social History  Alcohol use  Moderate alcohol use. Caffeine use  Carbonated beverages, Coffee, Tea. No drug use  Tobacco use  Never smoker.  Family History  Alcohol Abuse  Father. Hypertension  Mother. Prostate Cancer  Brother. Respiratory Condition  Mother, Sister.  Pregnancy / Birth History Age at menarche  45 years. Contraceptive History  Intrauterine device, Oral contraceptives. Gravida  2 Length (months) of breastfeeding  7-12 Maternal age  76-25 Para  2 Regular periods   Other Problems  Arthritis  Back Pain  Cerebrovascular Accident  Diverticulosis  High blood pressure  Seizure Disorder     Review of Systems  General Present- Fatigue. Not Present- Appetite  Loss, Chills, Fever, Night Sweats, Weight Gain and Weight Loss. Skin Not Present- Change in Wart/Mole, Dryness, Hives, Jaundice, New Lesions, Non-Healing Wounds, Rash and Ulcer. HEENT Present- Wears glasses/contact lenses. Not Present- Earache, Hearing Loss, Hoarseness, Nose Bleed, Oral Ulcers, Ringing in the Ears, Seasonal Allergies, Sinus Pain, Sore Throat, Visual Disturbances and Yellow Eyes. Respiratory Not Present- Bloody sputum, Chronic Cough, Difficulty Breathing, Snoring and Wheezing. Breast Not Present- Breast Mass, Breast Pain, Nipple Discharge and Skin Changes. Cardiovascular Present- Leg Cramps and Swelling of Extremities. Not Present- Chest Pain, Difficulty Breathing Lying Down, Palpitations, Rapid Heart Rate and Shortness of Breath. Gastrointestinal Present- Chronic diarrhea. Not Present- Abdominal Pain, Bloating, Bloody Stool, Change in Bowel Habits, Constipation, Difficulty Swallowing, Excessive gas, Gets full quickly at meals, Hemorrhoids, Indigestion, Nausea, Rectal Pain and Vomiting. Female Genitourinary Present- Urgency. Not Present- Frequency, Nocturia, Painful Urination and Pelvic Pain. Musculoskeletal Present- Back Pain. Not Present- Joint Pain, Joint Stiffness, Muscle Pain, Muscle Weakness and Swelling of Extremities. Neurological Present- Headaches. Not Present- Decreased Memory, Fainting, Numbness, Seizures, Tingling, Tremor, Trouble walking and Weakness. Psychiatric Present- Anxiety. Not Present- Bipolar, Change in Sleep Pattern, Depression, Fearful and Frequent crying. Endocrine Not Present- Cold Intolerance, Excessive Hunger, Hair Changes, Heat Intolerance, Hot flashes and New Diabetes. Hematology Not Present- Blood Thinners, Easy Bruising, Excessive bleeding, Gland problems, HIV and Persistent Infections.    Vitals Weight: 150.3 lb Height: 66in Body Surface Area: 1.77 m Body Mass Index: 24.26 kg/m  Temp.: 37F  Pulse: 79 (Regular)  Resp.: 18 (Unlabored)   BP: 136/67(Sitting, Left Arm, Standard)    Physical Exam  General Mental Status-Alert. General Appearance-Consistent with stated age. Hydration-Well hydrated. Voice-Normal.  Head and Neck Head-normocephalic, atraumatic with no lesions or palpable masses. Trachea-midline. Thyroid Gland Characteristics - normal size and consistency.  Eye Eyeball - Bilateral-Extraocular movements intact. Sclera/Conjunctiva - Bilateral-No scleral icterus.  Chest and Lung Exam Chest and lung exam reveals -quiet, even and easy respiratory effort with no use of accessory muscles and on auscultation, normal breath sounds, no adventitious sounds and normal vocal resonance. Inspection Chest Wall - Normal. Back - normal.  Breast Note: relatively symmetric bilaterally. no palpable masses. no LAD. some bruising on left upper breast. No nipple retraction or nipple discharge.   Cardiovascular Cardiovascular examination reveals -normal heart sounds, regular rate and rhythm with no murmurs and normal pedal pulses bilaterally.  Abdomen Inspection Inspection of the abdomen reveals - No Hernias. Palpation/Percussion Palpation and Percussion of the abdomen reveal - Soft, Non Tender, No Rebound tenderness, No Rigidity (guarding) and No hepatosplenomegaly. Auscultation Auscultation of the abdomen reveals - Bowel sounds normal.  Neurologic Neurologic evaluation reveals -alert and oriented x 3 with no impairment of recent or remote memory. Mental Status-Normal.  Musculoskeletal Global Assessment -Note: no gross deformities.  Normal Exam - Left-Upper Extremity Strength Normal and Lower Extremity Strength Normal. Normal Exam - Right-Upper Extremity Strength Normal and Lower Extremity Strength Normal.  Lymphatic Head & Neck  General Head & Neck Lymphatics: Bilateral - Description - Normal. Axillary  General Axillary Region: Bilateral - Description - Normal. Tenderness  - Non Tender. Femoral & Inguinal  Generalized Femoral & Inguinal Lymphatics: Bilateral - Description - No Generalized lymphadenopathy.    Assessment & Plan  MALIGNANT NEOPLASM OF UPPER-OUTER QUADRANT OF LEFT BREAST IN FEMALE, ESTROGEN RECEPTOR POSITIVE (C50.412) Impression: Pt has a new diagnosis of stage 0 breast cancer. This is amenable to a lumpectomy.  The surgical procedure was described to the patient. I discussed the incision type and location and that we would need radiology involved on with a wire or seed marker and/or sentinel node.  The risks and benefits of the procedure were described to the patient and she wishes to proceed.  We discussed the risks bleeding, infection, damage to other structures, need for further procedures/surgeries. We discussed the risk of seroma. The patient was advised if the area in the breast in cancer, we may need to go back to surgery for additional tissue to obtain negative margins or for a lymph node biopsy. The patient was advised that these are the most common complications, but that others can occur as well. They were advised against taking aspirin or other anti-inflammatory agents/blood thinners the week before surgery. Current Plans You are being scheduled for surgery- Our schedulers will call you.  You should hear from our office's scheduling department within 5 working days about the location, date, and time of surgery. We try to make accommodations for patient's preferences in scheduling surgery, but sometimes the OR schedule or the surgeon's schedule prevents [place] from making those accommodations.  If you have not heard from our office [phone]) in 5 working days, call the office and ask for your surgeon's nurse.  If you have other questions about your diagnosis, plan, or surgery, call the office and ask for your surgeon's nurse.  Pt Education - flb breast cancer surgery: discussed with patient and provided information.

## 2020-08-24 NOTE — Progress Notes (Signed)
[id number] - [place], [place] - [address] [place] [address] Phone: [phone] Fax: [phone]      Your procedure is scheduled on Friday December 17  Report to [facility] Main Entrance "A" at 0530 A.M., and check in at the Admitting office.  Call this number if you have problems the morning of surgery:  [phone]  Call [phone] if you have any questions prior to your surgery date Monday-Friday 8am-4pm    Remember:  Do not eat or drink after midnight the night before your surgery    Take these medicines the morning of surgery with A SIP OF WATER  acetaminophen (TYLENOL) if needed ALPRAZolam Duanne Moron) if needed cetirizine (ZYRTEC) famotidine (PEPCID)  levETIRAcetam (KEPPRA XR)  As of today, STOP taking any Aspirin (unless otherwise instructed by your surgeon) Aleve, Naproxen, Ibuprofen, Motrin, Advil, Goody's, BC's, all herbal medications, fish oil, and all vitamins. meloxicam (MOBIC)                       Do not wear jewelry, make up, or nail polish            Do not wear lotions, powders, perfumes, or deodorant.            Do not shave 48 hours prior to surgery.             Do not bring valuables to the hospital.            [facility] is not responsible for any belongings or valuables.  Do NOT Smoke (Tobacco/Vaping) or drink Alcohol 24 hours prior to your procedure If you use a CPAP at night, you may bring all equipment for your overnight stay.   Contacts, glasses, dentures or bridgework may not be worn into surgery.      For patients admitted to the hospital, discharge time will be determined by your treatment team.   Patients discharged the day of surgery will not be allowed to drive home, and someone needs to stay with them for 24 hours.    Special instructions:   [place]- Preparing For Surgery  Before surgery, you can play an important role. Because skin is not sterile, your  skin needs to be as free of germs as possible. You can reduce the number of germs on your skin by washing with CHG (chlorahexidine gluconate) Soap before surgery.  CHG is an antiseptic cleaner which kills germs and bonds with the skin to continue killing germs even after washing.    Oral Hygiene is also important to reduce your risk of infection.  Remember - BRUSH YOUR TEETH THE MORNING OF SURGERY WITH YOUR REGULAR TOOTHPASTE  Please do not use if you have an allergy to CHG or antibacterial soaps. If your skin becomes reddened/irritated stop using the CHG.  Do not shave (including legs and underarms) for at least 48 hours prior to first CHG shower. It is OK to shave your face.  Please follow these instructions carefully.   1. Shower the NIGHT BEFORE SURGERY and the MORNING OF SURGERY with CHG Soap.   2. If you chose to wash your hair, wash your hair first as usual with your normal shampoo.  3. After you shampoo, rinse your hair and body thoroughly to remove the shampoo.  4. Use CHG as you would any other liquid soap. You can apply CHG directly to the skin and wash gently  with a scrungie or a clean washcloth.   5. Apply the CHG Soap to your body ONLY FROM THE NECK DOWN.  Do not use on open wounds or open sores. Avoid contact with your eyes, ears, mouth and genitals (private parts). Wash Face and genitals (private parts)  with your normal soap.   6. Wash thoroughly, paying special attention to the area where your surgery will be performed.  7. Thoroughly rinse your body with warm water from the neck down.  8. DO NOT shower/wash with your normal soap after using and rinsing off the CHG Soap.  9. Pat yourself dry with a CLEAN TOWEL.  10. Wear CLEAN PAJAMAS to bed the night before surgery  11. Place CLEAN SHEETS on your bed the night of your first shower and DO NOT SLEEP WITH PETS.   Day of Surgery: Wear Clean/Comfortable clothing the morning of surgery Do not apply any  deodorants/lotions.   Remember to brush your teeth WITH YOUR REGULAR TOOTHPASTE.   Please read over the following fact sheets that you were given.

## 2020-08-25 ENCOUNTER — Telehealth: Payer: Self-pay | Admitting: Family Medicine

## 2020-08-25 ENCOUNTER — Encounter (HOSPITAL_COMMUNITY)
Admission: RE | Admit: 2020-08-25 | Discharge: 2020-08-25 | Disposition: A | Discharge status: Home / Self Care | Payer: Medicare PPO | Source: Ambulatory Visit | Attending: General Surgery | Admitting: General Surgery

## 2020-08-25 ENCOUNTER — Encounter (HOSPITAL_COMMUNITY): Payer: Self-pay

## 2020-08-25 ENCOUNTER — Other Ambulatory Visit: Payer: Self-pay

## 2020-08-25 ENCOUNTER — Other Ambulatory Visit (HOSPITAL_COMMUNITY)
Admission: RE | Admit: 2020-08-25 | Discharge: 2020-08-25 | Disposition: A | Discharge status: Home / Self Care | Payer: Medicare PPO | Source: Ambulatory Visit | Attending: General Surgery | Admitting: General Surgery

## 2020-08-25 DIAGNOSIS — Z01812 Encounter for preprocedural laboratory examination: Secondary | ICD-10-CM | POA: Diagnosis not present

## 2020-08-25 DIAGNOSIS — Z20822 Contact with and (suspected) exposure to covid-19: Secondary | ICD-10-CM | POA: Insufficient documentation

## 2020-08-25 DIAGNOSIS — Z01818 Encounter for other preprocedural examination: Secondary | ICD-10-CM | POA: Insufficient documentation

## 2020-08-25 DIAGNOSIS — I10 Essential (primary) hypertension: Secondary | ICD-10-CM | POA: Insufficient documentation

## 2020-08-25 HISTORY — DX: Gastro-esophageal reflux disease without esophagitis: K21.9

## 2020-08-25 HISTORY — DX: Essential (primary) hypertension: I10

## 2020-08-25 LAB — BASIC METABOLIC PANEL
Anion gap: 9 (ref 5–15)
BUN: 16 mg/dL (ref 8–23)
CO2: 28 mmol/L (ref 22–32)
Calcium: 9.5 mg/dL (ref 8.9–10.3)
Chloride: 104 mmol/L (ref 98–111)
Creatinine, Ser: 0.75 mg/dL (ref 0.44–1.00)
GFR, Estimated: >60 mL/min (ref >60)
Glucose, Bld: 93 mg/dL (ref 70–99)
Potassium: 4 mmol/L (ref 3.5–5.1)
Sodium: 141 mmol/L (ref 135–145)

## 2020-08-25 LAB — CBC
HCT: 36.2 % (ref 36.0–46.0)
Hemoglobin: 12.4 g/dL (ref 12.0–15.0)
MCH: 31.3 pg (ref 26.0–34.0)
MCHC: 34.3 g/dL (ref 30.0–36.0)
MCV: 91.4 fL (ref 80.0–100.0)
Platelets: 293 10*3/uL (ref 150–400)
RBC: 3.96 MIL/uL (ref 3.87–5.11)
RDW: 12.8 % (ref 11.5–15.5)
WBC: 6.8 10*3/uL (ref 4.0–10.5)
nRBC: 0 % (ref 0.0–0.2)

## 2020-08-25 LAB — SARS CORONAVIRUS 2 (TAT 6-24 HRS): SARS Coronavirus 2: NEGATIVE

## 2020-08-25 MED ORDER — CHLORHEXIDINE GLUCONATE CLOTH 2 % EX PADS: 6.0000 | MEDICATED_PAD | Freq: Once | CUTANEOUS | Status: DC

## 2020-08-25 NOTE — Progress Notes (Signed)
PCP - DR Dennard Schaumann [place]     PT TO VISIT HER PCP TODAY  Cardiologist - NA      Chest x-ray - NA EKG - 08/25/20 Stress Test - NA ECHO - NA Cardiac Cath -NA      : Aspirin Instructions:STOP  ERAS Protcol -INSTRUCTIONS GIVEN     COVID TEST- FOR 08/25/20   Anesthesia review: HX HTN  Patient denies shortness of breath, fever, cough and chest pain at PAT appointment   All instructions explained to the patient, with a verbal understanding of the material. Patient agrees to go over the instructions while at home for a better understanding. Patient also instructed to self quarantine after being tested for COVID-19. The opportunity to ask questions was provided.

## 2020-08-25 NOTE — Telephone Encounter (Signed)
Patient left vm stating she was having a lympectomy on Friday. She also has questions about her bone density test that she had done. She wants to know if she needs to continue taking the medication that she takes for the bone density.   CB # [phone]

## 2020-08-26 ENCOUNTER — Encounter: Payer: Self-pay | Admitting: General Practice

## 2020-08-26 DIAGNOSIS — C50212 Malignant neoplasm of upper-inner quadrant of left female breast: Secondary | ICD-10-CM | POA: Diagnosis not present

## 2020-08-26 NOTE — Progress Notes (Signed)
[place] Spiritual Care Note  Connected with Ms Denson to debrief about her recent vacation to see her son's family in [place] and to process her feelings about her upcoming surgery. She describes herself as "in a funk" in part because she hasn't been able to accomplish around the house everything that she wanted to prepare prior to surgery, and perhaps in part because of post-trip letdown. She has very high confidence in her procedure and anticipated recovery, which helps mitigate the present distress. Ms roxsana riding pastoral check-ins and welcomes a call in a couple weeks.   [place], [place], [facility] Pager [phone] Voicemail [phone]

## 2020-08-27 ENCOUNTER — Telehealth: Payer: Self-pay

## 2020-08-27 NOTE — Telephone Encounter (Signed)
Cynthia [name] would like to know if there is any question's she should ask the surgeon on 08-28-20. She' going for lumpectomy ? In January she will start chemo.

## 2020-08-27 NOTE — Telephone Encounter (Signed)
Call placed to patient. LMTRC.  

## 2020-08-27 NOTE — Anesthesia Preprocedure Evaluation (Addendum)
Anesthesia Evaluation  Patient identified by MRN, date of birth, ID band Patient awake    Reviewed: Allergy & Precautions, NPO status , Patient's Chart, lab work & pertinent test results  Airway Mallampati: II  TM Distance: >3 FB Neck ROM: Full    Dental  (+) Dental Advisory Given, Implants, Teeth Intact   Pulmonary neg pulmonary ROS,    Pulmonary exam normal breath sounds clear to auscultation       Cardiovascular hypertension, Pt. on medications Normal cardiovascular exam Rhythm:Regular Rate:Normal     Neuro/Psych  Headaches, Seizures -,  PSYCHIATRIC DISORDERS Depression CVA    GI/Hepatic Neg liver ROS, GERD  ,  Endo/Other  negative endocrine ROS  Renal/GU negative Renal ROS     Musculoskeletal negative musculoskeletal ROS (+)   Abdominal   Peds  Hematology  (+) Blood dyscrasia, anemia ,   Anesthesia Other Findings   Reproductive/Obstetrics                            Anesthesia Physical Anesthesia Plan  ASA: III  Anesthesia Plan: General   Post-op Pain Management:    Induction: Intravenous  PONV Risk Score and Plan: 4 or greater and Ondansetron, Dexamethasone and Treatment may vary due to age or medical condition  Airway Management Planned: LMA  Additional Equipment: None  Intra-op Plan:   Post-operative Plan: Extubation in OR  Informed Consent: I have reviewed the patients History and Physical, chart, labs and discussed the procedure including the risks, benefits and alternatives for the proposed anesthesia with the patient or authorized representative who has indicated his/her understanding and acceptance.     Dental advisory given  Plan Discussed with: CRNA  Anesthesia Plan Comments:        Anesthesia Quick Evaluation

## 2020-08-27 NOTE — Telephone Encounter (Signed)
Compared to 2013, bone density has improved.  She asks a good question, she can hold fosamax if she has been on it for 5 years, and then repeat DEXA in 2 years.  We will resume fosamax if DEXA is worse in 2 years.

## 2020-08-27 NOTE — Telephone Encounter (Signed)
There are no questions that I can think to add.  I wish her the best of luck.

## 2020-08-28 ENCOUNTER — Ambulatory Visit (HOSPITAL_COMMUNITY)
Admission: RE | Admit: 2020-08-28 | Discharge: 2020-08-28 | Disposition: A | Discharge status: Home / Self Care | Payer: Medicare PPO | Attending: General Surgery | Admitting: General Surgery

## 2020-08-28 ENCOUNTER — Encounter (HOSPITAL_COMMUNITY): Payer: Self-pay | Admitting: General Surgery

## 2020-08-28 ENCOUNTER — Ambulatory Visit (HOSPITAL_COMMUNITY): Payer: Medicare PPO | Admitting: Anesthesiology

## 2020-08-28 ENCOUNTER — Other Ambulatory Visit: Payer: Self-pay

## 2020-08-28 ENCOUNTER — Encounter (HOSPITAL_COMMUNITY)
Admission: RE | Disposition: A | Discharge status: Home / Self Care | Payer: Self-pay | Source: Home / Self Care | Attending: General Surgery

## 2020-08-28 DIAGNOSIS — F32A Depression, unspecified: Secondary | ICD-10-CM | POA: Diagnosis not present

## 2020-08-28 DIAGNOSIS — C50912 Malignant neoplasm of unspecified site of left female breast: Secondary | ICD-10-CM | POA: Diagnosis not present

## 2020-08-28 DIAGNOSIS — C50412 Malignant neoplasm of upper-outer quadrant of left female breast: Secondary | ICD-10-CM | POA: Insufficient documentation

## 2020-08-28 DIAGNOSIS — Z170 Estrogen receptor positive status [ER+]: Secondary | ICD-10-CM | POA: Insufficient documentation

## 2020-08-28 DIAGNOSIS — Z800 Family history of malignant neoplasm of digestive organs: Secondary | ICD-10-CM | POA: Diagnosis not present

## 2020-08-28 DIAGNOSIS — Z8042 Family history of malignant neoplasm of prostate: Secondary | ICD-10-CM | POA: Insufficient documentation

## 2020-08-28 DIAGNOSIS — I10 Essential (primary) hypertension: Secondary | ICD-10-CM | POA: Diagnosis not present

## 2020-08-28 DIAGNOSIS — D0512 Intraductal carcinoma in situ of left breast: Secondary | ICD-10-CM | POA: Diagnosis not present

## 2020-08-28 DIAGNOSIS — E871 Hypo-osmolality and hyponatremia: Secondary | ICD-10-CM | POA: Diagnosis not present

## 2020-08-28 HISTORY — PX: BREAST LUMPECTOMY WITH RADIOACTIVE SEED LOCALIZATION: SHX6424

## 2020-08-28 SURGERY — BREAST LUMPECTOMY WITH RADIOACTIVE SEED LOCALIZATION
Anesthesia: General | Site: Breast | Laterality: Left

## 2020-08-28 MED ORDER — LIDOCAINE HCL (CARDIAC) PF 100 MG/5ML IV SOSY
PREFILLED_SYRINGE | INTRAVENOUS | Status: DC | PRN
  Administered 2020-08-28: 100 mg via INTRATRACHEAL

## 2020-08-28 MED ORDER — ONDANSETRON HCL 4 MG/2ML IJ SOLN
INTRAMUSCULAR | Status: DC | PRN
  Administered 2020-08-28: 4 mg via INTRAVENOUS

## 2020-08-28 MED ORDER — TRAMADOL HCL 50 MG PO TABS: 50.0000 mg | ORAL_TABLET | Freq: Four times a day (QID) | ORAL | 1 refills | Status: DC | PRN

## 2020-08-28 MED ORDER — LIDOCAINE-EPINEPHRINE 1 %-1:100000 IJ SOLN
INTRAMUSCULAR | Status: DC | PRN
  Administered 2020-08-28: 20 mL

## 2020-08-28 MED ORDER — 0.9 % SODIUM CHLORIDE (POUR BTL) OPTIME
TOPICAL | Status: DC | PRN
  Administered 2020-08-28: 09:00:00 1000 mL

## 2020-08-28 MED ORDER — METHYLENE BLUE 0.5 % INJ SOLN
INTRAVENOUS | Status: AC
  Filled 2020-08-28: qty 10

## 2020-08-28 MED ORDER — PROPOFOL 10 MG/ML IV BOLUS
INTRAVENOUS | Status: DC | PRN
  Administered 2020-08-28: 140 mg via INTRAVENOUS

## 2020-08-28 MED ORDER — ONDANSETRON HCL 4 MG/2ML IJ SOLN: 4.0000 mg | Freq: Once | INTRAMUSCULAR | Status: DC | PRN

## 2020-08-28 MED ORDER — BUPIVACAINE HCL (PF) 0.25 % IJ SOLN
INTRAMUSCULAR | Status: DC | PRN
  Administered 2020-08-28: 20 mL

## 2020-08-28 MED ORDER — ACETAMINOPHEN 500 MG PO TABS
1000.0000 mg | ORAL_TABLET | ORAL | Status: AC
  Administered 2020-08-28: 06:00:00 1000 mg via ORAL
  Filled 2020-08-28: qty 2

## 2020-08-28 MED ORDER — BUPIVACAINE HCL (PF) 0.25 % IJ SOLN
INTRAMUSCULAR | Status: AC
  Filled 2020-08-28: qty 30

## 2020-08-28 MED ORDER — LIDOCAINE-EPINEPHRINE 1 %-1:100000 IJ SOLN
INTRAMUSCULAR | Status: AC
  Filled 2020-08-28: qty 1

## 2020-08-28 MED ORDER — EPHEDRINE SULFATE 50 MG/ML IJ SOLN
INTRAMUSCULAR | Status: DC | PRN
  Administered 2020-08-28 (×2): 10 mg via INTRAVENOUS

## 2020-08-28 MED ORDER — ONDANSETRON HCL 4 MG/2ML IJ SOLN
INTRAMUSCULAR | Status: AC
  Filled 2020-08-28: qty 2

## 2020-08-28 MED ORDER — DEXAMETHASONE SODIUM PHOSPHATE 10 MG/ML IJ SOLN
INTRAMUSCULAR | Status: AC
  Filled 2020-08-28: qty 1

## 2020-08-28 MED ORDER — CHLORHEXIDINE GLUCONATE 0.12 % MT SOLN
15.0000 mL | Freq: Once | OROMUCOSAL | Status: AC
  Administered 2020-08-28: 06:00:00 15 mL via OROMUCOSAL
  Filled 2020-08-28: qty 15

## 2020-08-28 MED ORDER — DEXAMETHASONE SODIUM PHOSPHATE 10 MG/ML IJ SOLN
INTRAMUSCULAR | Status: DC | PRN
  Administered 2020-08-28: 5 mg via INTRAVENOUS

## 2020-08-28 MED ORDER — LACTATED RINGERS IV SOLN: INTRAVENOUS | Status: DC

## 2020-08-28 MED ORDER — FENTANYL CITRATE (PF) 100 MCG/2ML IJ SOLN: 25.0000 ug | INTRAMUSCULAR | Status: DC | PRN

## 2020-08-28 MED ORDER — FENTANYL CITRATE (PF) 250 MCG/5ML IJ SOLN
INTRAMUSCULAR | Status: DC | PRN
  Administered 2020-08-28: 50 ug via INTRAVENOUS

## 2020-08-28 MED ORDER — FENTANYL CITRATE (PF) 250 MCG/5ML IJ SOLN
INTRAMUSCULAR | Status: AC
  Filled 2020-08-28: qty 5

## 2020-08-28 MED ORDER — ORAL CARE MOUTH RINSE: 15.0000 mL | Freq: Once | OROMUCOSAL | Status: AC

## 2020-08-28 MED ORDER — LIDOCAINE 2% (20 MG/ML) 5 ML SYRINGE
INTRAMUSCULAR | Status: AC
  Filled 2020-08-28: qty 5

## 2020-08-28 MED ORDER — SODIUM CHLORIDE (PF) 0.9 % IJ SOLN
INTRAMUSCULAR | Status: AC
  Filled 2020-08-28: qty 10

## 2020-08-28 MED ORDER — CEFAZOLIN SODIUM-DEXTROSE 2-4 GM/100ML-% IV SOLN
2.0000 g | INTRAVENOUS | Status: AC
  Administered 2020-08-28: 08:00:00 2 g via INTRAVENOUS
  Filled 2020-08-28: qty 100

## 2020-08-28 MED ORDER — PROPOFOL 10 MG/ML IV BOLUS
INTRAVENOUS | Status: AC
  Filled 2020-08-28: qty 20

## 2020-08-28 MED ORDER — OXYCODONE HCL 5 MG PO TABS: 5.0000 mg | ORAL_TABLET | Freq: Once | ORAL | Status: DC | PRN

## 2020-08-28 MED ORDER — MIDAZOLAM HCL 2 MG/2ML IJ SOLN
INTRAMUSCULAR | Status: AC
  Filled 2020-08-28: qty 2

## 2020-08-28 MED ORDER — OXYCODONE HCL 5 MG/5ML PO SOLN: 5.0000 mg | Freq: Once | ORAL | Status: DC | PRN

## 2020-08-28 SURGICAL SUPPLY — 42 items
BINDER BREAST LRG (GAUZE/BANDAGES/DRESSINGS) ×2 IMPLANT
BINDER BREAST XLRG (GAUZE/BANDAGES/DRESSINGS) IMPLANT
BLADE SURG 10 STRL SS (BLADE) IMPLANT
CANISTER SUCT 3000ML PPV (MISCELLANEOUS) ×2 IMPLANT
CHLORAPREP W/TINT 26 (MISCELLANEOUS) ×2 IMPLANT
CLIP VESOCCLUDE LG 6/CT (CLIP) ×2 IMPLANT
CLIP VESOCCLUDE MED 6/CT (CLIP) IMPLANT
COVER PROBE W GEL 5X96 (DRAPES) ×2 IMPLANT
COVER SURGICAL LIGHT HANDLE (MISCELLANEOUS) ×2 IMPLANT
COVER WAND RF STERILE (DRAPES) IMPLANT
DERMABOND ADVANCED (GAUZE/BANDAGES/DRESSINGS) ×1
DERMABOND ADVANCED .7 DNX12 (GAUZE/BANDAGES/DRESSINGS) ×1 IMPLANT
DEVICE DUBIN SPECIMEN MAMMOGRA (MISCELLANEOUS) ×2 IMPLANT
DRAPE CHEST BREAST 15X10 FENES (DRAPES) ×2 IMPLANT
DRSG PAD ABDOMINAL 8X10 ST (GAUZE/BANDAGES/DRESSINGS) ×2 IMPLANT
ELECT COATED BLADE 2.86 ST (ELECTRODE) ×2 IMPLANT
ELECT REM PT RETURN 9FT ADLT (ELECTROSURGICAL) ×2
ELECTRODE REM PT RTRN 9FT ADLT (ELECTROSURGICAL) ×1 IMPLANT
GAUZE SPONGE 4X4 12PLY STRL (GAUZE/BANDAGES/DRESSINGS) ×2 IMPLANT
GAUZE SPONGE 4X4 12PLY STRL LF (GAUZE/BANDAGES/DRESSINGS) IMPLANT
GLOVE BIO SURGEON STRL SZ 6 (GLOVE) ×2 IMPLANT
GLOVE INDICATOR 6.5 STRL GRN (GLOVE) ×2 IMPLANT
GOWN STRL REUS W/ TWL LRG LVL3 (GOWN DISPOSABLE) ×2 IMPLANT
GOWN STRL REUS W/TWL 2XL LVL3 (GOWN DISPOSABLE) ×2 IMPLANT
GOWN STRL REUS W/TWL LRG LVL3 (GOWN DISPOSABLE) ×4
KIT BASIN OR (CUSTOM PROCEDURE TRAY) ×2 IMPLANT
KIT MARKER MARGIN INK (KITS) ×2 IMPLANT
LIGHT WAVEGUIDE WIDE FLAT (MISCELLANEOUS) ×2 IMPLANT
NEEDLE 22X1 1/2 (OR ONLY) (NEEDLE) ×2 IMPLANT
NEEDLE HYPO 25GX1X1/2 BEV (NEEDLE) ×2 IMPLANT
NS IRRIG 1000ML POUR BTL (IV SOLUTION) IMPLANT
PACK GENERAL/GYN (CUSTOM PROCEDURE TRAY) ×2 IMPLANT
STRIP CLOSURE SKIN 1/2X4 (GAUZE/BANDAGES/DRESSINGS) IMPLANT
SUT MNCRL AB 4-0 PS2 18 (SUTURE) ×2 IMPLANT
SUT SILK 2 0 SH (SUTURE) IMPLANT
SUT VIC AB 2-0 SH 27 (SUTURE) ×1
SUT VIC AB 2-0 SH 27XBRD (SUTURE) ×1 IMPLANT
SUT VIC AB 3-0 SH 27 (SUTURE) ×2
SUT VIC AB 3-0 SH 27X BRD (SUTURE) ×1 IMPLANT
SYR CONTROL 10ML LL (SYRINGE) ×2 IMPLANT
TOWEL GREEN STERILE (TOWEL DISPOSABLE) ×2 IMPLANT
TOWEL GREEN STERILE FF (TOWEL DISPOSABLE) ×2 IMPLANT

## 2020-08-28 NOTE — Op Note (Signed)
Left Breast Radioactive seed localized lumpectomy  Indications: This patient presents with history of left breast cancer, upper outer quadrant, cTis, receptors weak ER+/PR-  Pre-operative Diagnosis: left breast cancer  Post-operative Diagnosis: Same  Surgeon: Stark Klein   Anesthesia: General endotracheal anesthesia  ASA Class: 3  Procedure Details  The patient was seen in the Holding Room. The risks, benefits, complications, treatment options, and expected outcomes were discussed with the patient. The possibilities of bleeding, infection, the need for additional procedures, failure to diagnose a condition, and creating a complication requiring other procedures or operations were discussed with the patient. The patient concurred with the proposed plan, giving informed consent.  The site of surgery properly noted/marked. The patient was taken to Operating Room # 1, identified, and the procedure verified as left breast seed localized lumpectomy.  The left breast and chest were prepped and draped in standard fashion. A curvilinear superior incision was made near the previously placed radioactive seed.  Dissection was carried down around the point of maximum signal intensity. The cautery was used to perform the dissection.   The specimen was inked with the margin marker paint kit.    Specimen radiography confirmed inclusion of the mammographic lesion, the clip, and the seed.  The background signal in the breast was zero.  Hemostasis was achieved with cautery.  The cavity was marked with clips on each border other than the anterior border.  The wound was irrigated and closed with 3-0 vicryl interrupted deep dermal sutures and 4-0 monocryl running subcuticular suture.      Sterile dressings were applied. At the end of the operation, all sponge, instrument, and needle counts were correct.   Findings: Seed, clip in specimen.  Posterior margin is pectoralis   Estimated Blood Loss:  min          Specimens: left breast tissue with seed         Complications:  None; patient tolerated the procedure well.         Disposition: PACU - hemodynamically stable.         Condition: stable

## 2020-08-28 NOTE — Transfer of Care (Signed)
Immediate Anesthesia Transfer of Care Note  Patient: Cynthia [name]  Procedure(s) Performed: LEFT BREAST LUMPECTOMY WITH RADIOACTIVE SEED LOCALIZATION (Left Breast)  Patient Location: PACU  Anesthesia Type:General  Level of Consciousness: drowsy and patient cooperative  Airway & Oxygen Therapy: Patient Spontanous Breathing and Patient connected to face mask oxygen  Post-op Assessment: Report given to RN and Post -op Vital signs reviewed and stable  Post vital signs: Reviewed and stable  Last Vitals:  Vitals Value Taken Time  BP 136/76 08/28/20 0856  Temp    Pulse 103 08/28/20 0856  Resp 13 08/28/20 0856  SpO2 100 % 08/28/20 0856  Vitals shown include unvalidated device data.  Last Pain:  Vitals:   08/28/20 0613  TempSrc: Oral  PainSc:          Complications: No complications documented.

## 2020-08-28 NOTE — Anesthesia Procedure Notes (Signed)
Procedure Name: LMA Insertion Date/Time: 08/28/2020 7:54 AM Performed by: Kathryne Hitch, CRNA Pre-anesthesia Checklist: Patient identified, Emergency Drugs available, Suction available and Patient being monitored Patient Re-evaluated:Patient Re-evaluated prior to induction Oxygen Delivery Method: Circle system utilized Preoxygenation: Pre-oxygenation with 100% oxygen Induction Type: IV induction LMA: LMA inserted LMA Size: 4.0 Number of attempts: 1 Comments: Performed by Vernelle Emerald, SRNA

## 2020-08-28 NOTE — Telephone Encounter (Signed)
Call placed to patient. LMTRC.  

## 2020-08-28 NOTE — Discharge Instructions (Addendum)
[facility] °Office Phone Number [phone] ° °BREAST BIOPSY/ PARTIAL MASTECTOMY: POST OP INSTRUCTIONS ° °Always review your discharge instruction sheet given to you by the facility where your surgery was performed. ° °IF YOU HAVE DISABILITY OR FAMILY LEAVE FORMS, YOU MUST BRING THEM TO THE OFFICE FOR PROCESSING.  DO NOT GIVE THEM TO YOUR DOCTOR. ° °1. A prescription for pain medication may be given to you upon discharge.  Take your pain medication as prescribed, if needed.  If narcotic pain medicine is not needed, then you may take acetaminophen (Tylenol) or ibuprofen (Advil) as needed. °2. Take your usually prescribed medications unless otherwise directed °3. If you need a refill on your pain medication, please contact your pharmacy.  They will contact our office to request authorization.  Prescriptions will not be filled after 5pm or on week-ends. °4. You should eat very light the first 24 hours after surgery, such as soup, crackers, pudding, etc.  Resume your normal diet the day after surgery. °5. Most patients will experience some swelling and bruising in the breast.  Ice packs and a good support bra will help.  Swelling and bruising can take several days to resolve.  °6. It is common to experience some constipation if taking pain medication after surgery.  Increasing fluid intake and taking a stool softener will usually help or prevent this problem from occurring.  A mild laxative (Milk of Magnesia or Miralax) should be taken according to package directions if there are no bowel movements after 48 hours. °7. Unless discharge instructions indicate otherwise, you may remove your bandages 48 hours after surgery, and you may shower at that time.  You may have steri-strips (small skin tapes) in place directly over the incision.  These strips should be left on the skin for 7-10 days.   Any sutures or staples will be removed at the office during your follow-up visit. °8. ACTIVITIES:  You may resume  regular daily activities (gradually increasing) beginning the next day.  Wearing a good support bra or sports bra (or the breast binder) minimizes pain and swelling.  You may have sexual intercourse when it is comfortable. °a. You may drive when you no longer are taking prescription pain medication, you can comfortably wear a seatbelt, and you can safely maneuver your car and apply brakes. °b. RETURN TO WORK:  __________1 week_______________ °9. You should see your doctor in the office for a follow-up appointment approximately two weeks after your surgery.  Your doctor’s nurse will typically make your follow-up appointment when she calls you with your pathology report.  Expect your pathology report 2-3 business days after your surgery.  You may call to check if you do not hear from us after three days. ° ° °WHEN TO CALL YOUR DOCTOR: °1. Fever over 101.0 °2. Nausea and/or vomiting. °3. Extreme swelling or bruising. °4. Continued bleeding from incision. °5. Increased pain, redness, or drainage from the incision. ° °The clinic staff is available to answer your questions during regular business hours.  Please don’t hesitate to call and ask to speak to one of the nurses for clinical concerns.  If you have a medical emergency, go to the nearest emergency room or call 911.  A surgeon from [facility] is always on call at the hospital. ° °For further questions, please visit [email or web address]  ° °

## 2020-08-28 NOTE — Interval H&P Note (Signed)
History and Physical Interval Note:  08/28/2020 7:37 AM  Cynthia [name]  has presented today for surgery, with the diagnosis of LEFT BREAST CANCER.  The various methods of treatment have been discussed with the patient and family. After consideration of risks, benefits and other options for treatment, the patient has consented to  Procedure(s) with comments: LEFT BREAST LUMPECTOMY WITH RADIOACTIVE SEED LOCALIZATION (Left) - RNFA as a surgical intervention.  The patient's history has been reviewed, patient examined, no change in status, stable for surgery.  I have reviewed the patient's chart and labs.  Questions were answered to the patient's satisfaction.     Stark Klein

## 2020-08-29 NOTE — Anesthesia Postprocedure Evaluation (Signed)
Anesthesia Post Note  Patient: Cynthia [name]  Procedure(s) Performed: LEFT BREAST LUMPECTOMY WITH RADIOACTIVE SEED LOCALIZATION (Left Breast)     Patient location during evaluation: PACU Anesthesia Type: General Level of consciousness: sedated and patient cooperative Pain management: pain level controlled Vital Signs Assessment: post-procedure vital signs reviewed and stable Respiratory status: spontaneous breathing Cardiovascular status: stable Anesthetic complications: no   No complications documented.  Last Vitals:  Vitals:   08/28/20 0915 08/28/20 0925  BP: 135/62 133/63  Pulse: 86 83  Resp: 16 16  Temp:  (!) 36.2 C  SpO2: 100% 100%    Last Pain:  Vitals:   08/28/20 0925  TempSrc:   PainSc: 0-No pain                 Nolon Nations

## 2020-08-30 ENCOUNTER — Encounter (HOSPITAL_COMMUNITY): Payer: Self-pay | Admitting: General Surgery

## 2020-08-31 LAB — SURGICAL PATHOLOGY

## 2020-08-31 NOTE — Telephone Encounter (Signed)
Patient left vm returning your call.  CB# [phone]

## 2020-08-31 NOTE — Telephone Encounter (Signed)
Multiple calls placed to patient with no answer and no return call.   Message to be closed.  

## 2020-09-07 ENCOUNTER — Encounter: Payer: Self-pay | Admitting: *Deleted

## 2020-09-09 ENCOUNTER — Encounter: Payer: Self-pay | Admitting: General Practice

## 2020-09-09 NOTE — Progress Notes (Signed)
[facility] Spiritual Care Note  Followed up with Cabrini by phone for a post-op check-in. She reports doing very well and being active with little/no pain meds following surgery. Having her son from [place] come to provide moral and logistical support was very meaningful. Lei is aware of ongoing [facility] team availability as needed/desired.   [address] [place], [place], [facility] Pager [phone] Voicemail [phone]

## 2020-09-15 ENCOUNTER — Encounter: Payer: Self-pay | Admitting: Licensed Clinical Social Worker

## 2020-09-15 ENCOUNTER — Encounter: Payer: Self-pay | Admitting: General Practice

## 2020-09-15 ENCOUNTER — Telehealth: Payer: Self-pay | Admitting: Genetic Counselor

## 2020-09-15 NOTE — Progress Notes (Signed)
[facility] Clinical Social Work   CSW received call from patient requesting the information given in November as she is ready to move forward with support. Provided contact information to patient for [facility] and [facility]. Encouraged patient to call back with any other needs.   Merlyn Albert, LCSW

## 2020-09-15 NOTE — Telephone Encounter (Signed)
Returned Ms. Beaumont call regarding status of genetic testing.  Explained to Ms. Woodring that genetics labs were not sent out because she does not meet medical criteria for genetic testing based on available personal and family history.  Ms. Clauson knows that genetic counselors are available to meet with her if she wishes to discuss more about her family history or if she wishes to proceed with genetic testing through self-pay options.  Ms. Bonifield is not interested in genetic testing at this time but stated she will reach out in the future if she has additional questions.

## 2020-09-15 NOTE — Progress Notes (Signed)
[facility] Spiritual Care Note  Returned Cynthia Sanjuan' call with contact information for [facility]/LCSW, per request. Cynthia [name] offered appreciation, as well: "Thank you for following up with me. Your calls mean a lot and really help my morale." Will continue to keep in touch by phone, providing spiritual and emotional support.   [address] [place], [place], [facility] Pager [phone] Voicemail [phone]

## 2020-09-19 ENCOUNTER — Other Ambulatory Visit (HOSPITAL_COMMUNITY): Payer: Medicare PPO

## 2020-09-21 ENCOUNTER — Other Ambulatory Visit: Payer: Self-pay | Admitting: Family Medicine

## 2020-09-21 NOTE — Telephone Encounter (Signed)
Ok to refill??  Last office visit 06/02/2020.  Last refill 08/03/2020.

## 2020-09-29 ENCOUNTER — Ambulatory Visit
Admission: RE | Admit: 2020-09-29 | Discharge: 2020-09-29 | Disposition: A | Discharge status: Home / Self Care | Payer: Medicare PPO | Source: Ambulatory Visit | Attending: Radiation Oncology | Admitting: Radiation Oncology

## 2020-09-29 ENCOUNTER — Encounter: Payer: Self-pay | Admitting: Radiation Oncology

## 2020-09-29 ENCOUNTER — Other Ambulatory Visit: Payer: Self-pay

## 2020-09-29 VITALS — BP 140/83 | HR 89 | Temp 97.8°F | Resp 18 | Ht 65.5 in | Wt 152.8 lb

## 2020-09-29 DIAGNOSIS — Z170 Estrogen receptor positive status [ER+]: Secondary | ICD-10-CM | POA: Diagnosis not present

## 2020-09-29 DIAGNOSIS — D0512 Intraductal carcinoma in situ of left breast: Secondary | ICD-10-CM

## 2020-09-29 DIAGNOSIS — Z79899 Other long term (current) drug therapy: Secondary | ICD-10-CM | POA: Insufficient documentation

## 2020-09-29 DIAGNOSIS — Z510 Encounter for antineoplastic radiation therapy: Secondary | ICD-10-CM | POA: Insufficient documentation

## 2020-09-29 NOTE — Progress Notes (Signed)
Location of Breast Cancer: Ductal carcinoma in situ (DCIS) of LEFT breast   Histology per Pathology Report:  08/28/2020 FINAL MICROSCOPIC DIAGNOSIS:  A. BREAST, LEFT, LUMPECTOMY:  - Ductal carcinoma in situ, high grade, 0.25 cm  - Margins uninvolved by carcinoma (0.5; inferior margin)  - Previous biopsy site changes   Receptor Status: ER(20%), PR (Negative)  Did patient present with symptoms (if so, please note symptoms) or was this found on screening mammography?:  Patient had routine screening mammography on 07/06/2020 showing calcifications in the left breast. She underwent left diagnostic mammography with tomography at [facility] on 07/09/2020 showing: breast density category B; 1 cm grouped calcifications in left breast at 12 o'clock.  Past/Anticipated interventions by surgeon, if any: 08/28/2020 Dr. Stark Klein Left Breast Radioactive seed localized lumpectomy  Past/Anticipated interventions by medical oncology, if any: Under care of Dr. Lurline Del  (1) definitive surgery pending  (a) done 08/28/2020 (2) adjuvant radiation to follow (3) do not recommend anti-estrogens given minimal estrogen receptor positivity  Lymphedema issues, if any:  Patient denies    Pain issues, if any:  Reports occasional discomfort at surgical site, but that mainly occurs if she's not wearing a highly supportive bra   SAFETY ISSUES:  Prior radiation? No  Pacemaker/ICD? No  Possible current pregnancy? No--postmenopausal   Is the patient on methotrexate? No  Current Complaints / other details:   Patient has received both Moderna vaccines as well as booster, and her annual flu shot

## 2020-09-29 NOTE — Progress Notes (Signed)
Radiation Oncology         [phone] ________________________________  Name: Cynthia [name] MRN: [id number]  Date: 09/29/2020  DOB: 19-Mar-1946  Follow-Up Visit Note  Outpatient  CC: Susy Frizzle, MD  Magrinat, Virgie Dad, MD  Diagnosis:      ICD-10-CM   1. Ductal carcinoma in situ (DCIS) of left breast  D05.12 Ambulatory referral to Social Work    CHIEF COMPLAINT: Here to discuss management of left breast DCIS  Narrative:  The patient returns today for follow-up. She was seen in the multidisciplinary breast clinic on 07/19/2020, at which time it was recommended that she proceed with breast conserving surgery followed by adjuvant radiation therapy.  Breast surgery on the date of 08/28/2020 revealed: tumor size of 0.25 cm; histology of ductal carcinoma in situ; margin status to in situ disease of negative ("0.5" from inferior margin); ER status: 20% weak; PR status: 0% negative; Grade: 3.   Lymphedema issues, if any:  Patient denies    Pain issues, if any:  Reports occasional discomfort at surgical site, but that mainly occurs if she's not wearing a highly supportive bra   SAFETY ISSUES:  Prior radiation? No  Pacemaker/ICD? No  Possible current pregnancy? No--postmenopausal   Is the patient on methotrexate? No  Current Complaints / other details:   Patient has received both Moderna vaccines as well as booster, and her annual flu shot           ALLERGIES:  is allergic to other, acyclovir and related, codeine, and penicillins.  Meds: Current Outpatient Medications  Medication Sig Dispense Refill  . acetaminophen (TYLENOL) 500 MG tablet Take 1,000 mg by mouth every 6 (six) hours as needed for moderate pain or headache.    . alendronate (FOSAMAX) 70 MG tablet TAKE 1 TABLET BY MOUTH EVERY 7 DAYS. TAKE WITH FULL GLASS OF WATER ON AN EMPTY STOMACH (Patient taking differently: Take 70 mg by mouth every Sunday.) 12 tablet 3  . ALPRAZolam (XANAX) 0.5 MG tablet TAKE 1/2  TABLET BY MOUTH 3 TIMES A DAY AS NEEDED FOR ANXIETY 45 tablet 0  . Calcium Carb-Cholecalciferol (CALCIUM CARBONATE-VITAMIN D3) 600-400 MG-UNIT TABS Take 1 tablet by mouth 2 (two) times daily.    . Cetirizine HCl 10 MG TBDP Take 1 tablet by mouth at bedtime. Aller-Tec (from [facility])    . famotidine (PEPCID) 20 MG tablet TAKE 1 TABLET BY MOUTH TWICE A DAY 180 tablet 2  . ferrous sulfate 325 (65 FE) MG tablet Take 325 mg by mouth daily.    Marland Kitchen levETIRAcetam (KEPPRA XR) 500 MG 24 hr tablet TAKE 1 TABLET BY MOUTH EVERY DAY (Patient taking differently: Take 500 mg by mouth daily.) 90 tablet 2  . losartan (COZAAR) 100 MG tablet TAKE 1 TABLET BY MOUTH EVERY DAY (Patient taking differently: Take 100 mg by mouth daily.) 90 tablet 3  . vitamin B-12 (CYANOCOBALAMIN) 1000 MCG tablet Take 1,000 mcg by mouth daily.    Marland Kitchen zinc gluconate 50 MG tablet Take 50 mg by mouth daily.    Marland Kitchen EPIPEN 2-PAK 0.3 MG/0.3ML SOAJ injection USE AS DIRECTED (Patient not taking: Reported on 09/29/2020) 2 each 1  . meloxicam (MOBIC) 15 MG tablet Take 1 tablet (15 mg total) by mouth daily. (Patient not taking: Reported on 09/29/2020) 30 tablet 0  . traMADol (ULTRAM) 50 MG tablet Take 1 tablet (50 mg total) by mouth every 6 (six) hours as needed for moderate pain or severe pain. (Patient not taking: Reported on 09/29/2020)  15 tablet 1   No current facility-administered medications for this encounter.    Physical Findings:  height is 5' 5.5" (1.664 m) and weight is 152 lb 12.8 oz (69.3 kg). Her temperature is 97.8 F (36.6 C). Her blood pressure is 140/83 and her pulse is 89. Her respiration is 18 and oxygen saturation is 99%. .     General: Alert and oriented, in no acute distress Neurologic: No obvious focalities. Speech is fluent.  Psychiatric: Judgment and insight are intact. Affect is appropriate. Breast exam reveals satisfactory healing of surgical scar, left breast  Lab Findings: Lab Results  Component Value Date   WBC 6.8  08/25/2020   HGB 12.4 08/25/2020   HCT 36.2 08/25/2020   MCV 91.4 08/25/2020   PLT 293 08/25/2020     Radiographic Findings: No results found.  Impression/Plan: Left breast DCIS  We discussed adjuvant radiotherapy today.  I recommend 4 weeks of treatment to the left breast in order to reduce risk of local recurrence by half.  I reviewed the logistics, benefits, risks, and potential side effects of this treatment in detail. Risks may include but not necessary be limited to acute and late injury tissue in the radiation fields such as skin irritation (change in color/pigmentation, itching, dryness, pain, peeling). She may experience fatigue. We also discussed possible risk of long term cosmetic changes or scar tissue. There is also a smaller risk for lung toxicity, cardiac toxicity, lymphedema, musculoskeletal changes, rib fragility or induction of a second malignancy, late chronic non-healing soft tissue wound.    The patient asked good questions which I answered to her satisfaction. She is enthusiastic about proceeding with treatment. A consent form has been signed and placed in her chart.  Simulation will take place today. Treatment will start in a about 1 week.  On date of service, in total, I spent 21 minutes on this encounter. Patient was seen in person.  _____________________________________   Eppie Gibson, MD  This document serves as a record of services personally performed by Eppie Gibson, MD. It was created on his behalf by Clerance Lav, a trained medical scribe. The creation of this record is based on the scribe's personal observations and the provider's statements to them. This document has been checked and approved by the attending provider.

## 2020-09-30 ENCOUNTER — Encounter: Payer: Self-pay | Admitting: Licensed Clinical Social Worker

## 2020-09-30 NOTE — Progress Notes (Signed)
[place] Psychosocial Distress Screening Clinical Social Work  Clinical Social Work was referred by distress screening protocol.  The patient scored a 9 on the Psychosocial Distress Thermometer which indicates severe distress. Clinical Social Worker contacted patient by phone to assess for distress and other psychosocial needs. Patient having increased stress at home with partner. She has accessed the [facility] and received resources from them, including legal. She is wanting to take next steps but also states not being ready. She has good support from her sons.    ONCBCN DISTRESS SCREENING 09/29/2020  Screening Type Initial Screening  Distress experienced in past week (1-10) 9  Practical problem type   Family Problem type Partner  Emotional problem type Depression;Nervousness/Anxiety  Spiritual/Religous concerns type Facing my mortality  Information Concerns Type   Physical Problem type   Physician notified of physical symptoms Yes  Referral to clinical psychology No  Referral to clinical social work Yes  Referral to dietition No  Referral to financial advocate No  Referral to support programs Yes  Referral to palliative care No    Clinical Social Worker follow up needed: No. Patient will call with needs. Will continue to be followed by L. Lundeen as well.  If yes, follow up plan:  Lashana Spang E Onia Shiflett, LCSW

## 2020-10-02 ENCOUNTER — Encounter: Payer: Self-pay | Admitting: Radiation Oncology

## 2020-10-05 ENCOUNTER — Encounter: Payer: Self-pay | Admitting: General Practice

## 2020-10-05 ENCOUNTER — Encounter: Payer: Self-pay | Admitting: *Deleted

## 2020-10-05 DIAGNOSIS — Z510 Encounter for antineoplastic radiation therapy: Secondary | ICD-10-CM | POA: Diagnosis not present

## 2020-10-05 DIAGNOSIS — D0512 Intraductal carcinoma in situ of left breast: Secondary | ICD-10-CM | POA: Diagnosis not present

## 2020-10-05 NOTE — Progress Notes (Signed)
[place] Spiritual Care Note  Followed up with Edelmira by phone, providing pastoral listening as she shared and processed updates about how she is coping with her diagnosis/treatment and other matters. She notes how helpful it is to hear herself speak things aloud as part of her reflection and self-awareness process. She knows to contact chaplain whenever needed/desired, and I will also follow up in a couple weeks.   [place], [place], [facility] Pager [phone] Voicemail [phone]

## 2020-10-08 ENCOUNTER — Ambulatory Visit
Admission: RE | Admit: 2020-10-08 | Discharge: 2020-10-08 | Disposition: A | Discharge status: Home / Self Care | Payer: Medicare PPO | Source: Ambulatory Visit | Attending: Radiation Oncology | Admitting: Radiation Oncology

## 2020-10-08 ENCOUNTER — Other Ambulatory Visit: Payer: Self-pay

## 2020-10-08 DIAGNOSIS — D0512 Intraductal carcinoma in situ of left breast: Secondary | ICD-10-CM | POA: Diagnosis not present

## 2020-10-08 DIAGNOSIS — Z510 Encounter for antineoplastic radiation therapy: Secondary | ICD-10-CM | POA: Diagnosis not present

## 2020-10-09 ENCOUNTER — Ambulatory Visit
Admission: RE | Admit: 2020-10-09 | Discharge: 2020-10-09 | Disposition: A | Discharge status: Home / Self Care | Payer: Medicare PPO | Source: Ambulatory Visit | Attending: Radiation Oncology | Admitting: Radiation Oncology

## 2020-10-09 ENCOUNTER — Other Ambulatory Visit: Payer: Self-pay

## 2020-10-09 DIAGNOSIS — Z510 Encounter for antineoplastic radiation therapy: Secondary | ICD-10-CM | POA: Diagnosis not present

## 2020-10-09 DIAGNOSIS — D0512 Intraductal carcinoma in situ of left breast: Secondary | ICD-10-CM | POA: Diagnosis not present

## 2020-10-12 ENCOUNTER — Ambulatory Visit
Admission: RE | Admit: 2020-10-12 | Discharge: 2020-10-12 | Disposition: A | Discharge status: Home / Self Care | Payer: Medicare PPO | Source: Ambulatory Visit | Attending: Radiation Oncology | Admitting: Radiation Oncology

## 2020-10-12 ENCOUNTER — Other Ambulatory Visit: Payer: Self-pay

## 2020-10-12 DIAGNOSIS — D0512 Intraductal carcinoma in situ of left breast: Secondary | ICD-10-CM

## 2020-10-12 DIAGNOSIS — Z510 Encounter for antineoplastic radiation therapy: Secondary | ICD-10-CM | POA: Diagnosis not present

## 2020-10-12 MED ORDER — ALRA NON-METALLIC DEODORANT (RAD-ONC)
1.0000 "application " | Freq: Once | TOPICAL | Status: AC
  Administered 2020-10-12: 1 via TOPICAL

## 2020-10-12 MED ORDER — RADIAPLEXRX EX GEL: Freq: Once | CUTANEOUS | Status: AC

## 2020-10-12 NOTE — Progress Notes (Signed)
Pt here for patient teaching.  Pt given Radiation and You booklet, skin care instructions, Alra deodorant, and Radiaplex gel.  Reviewed areas of pertinence such as fatigue, hair loss, skin changes, breast tenderness, and breast swelling . Pt able to give teach back of to pat skin and use unscented/gentle soap,apply Radiaplex bid, avoid applying anything to skin within 4 hours of treatment, avoid wearing an under wire bra, and to use an electric razor if they must shave. Pt demonstrated understanding of information given and will contact nursing with any questions or concerns.

## 2020-10-13 ENCOUNTER — Ambulatory Visit
Admission: RE | Admit: 2020-10-13 | Discharge: 2020-10-13 | Disposition: A | Discharge status: Home / Self Care | Payer: Medicare PPO | Source: Ambulatory Visit | Attending: Radiation Oncology | Admitting: Radiation Oncology

## 2020-10-13 DIAGNOSIS — D0512 Intraductal carcinoma in situ of left breast: Secondary | ICD-10-CM | POA: Diagnosis not present

## 2020-10-14 ENCOUNTER — Ambulatory Visit
Admission: RE | Admit: 2020-10-14 | Discharge: 2020-10-14 | Disposition: A | Discharge status: Home / Self Care | Payer: Medicare PPO | Source: Ambulatory Visit | Attending: Radiation Oncology | Admitting: Radiation Oncology

## 2020-10-14 ENCOUNTER — Other Ambulatory Visit: Payer: Self-pay

## 2020-10-14 DIAGNOSIS — D0512 Intraductal carcinoma in situ of left breast: Secondary | ICD-10-CM | POA: Diagnosis not present

## 2020-10-15 ENCOUNTER — Ambulatory Visit
Admission: RE | Admit: 2020-10-15 | Discharge: 2020-10-15 | Disposition: A | Discharge status: Home / Self Care | Payer: Medicare PPO | Source: Ambulatory Visit | Attending: Radiation Oncology | Admitting: Radiation Oncology

## 2020-10-15 DIAGNOSIS — D0512 Intraductal carcinoma in situ of left breast: Secondary | ICD-10-CM | POA: Diagnosis not present

## 2020-10-16 ENCOUNTER — Ambulatory Visit
Admission: RE | Admit: 2020-10-16 | Discharge: 2020-10-16 | Disposition: A | Discharge status: Home / Self Care | Payer: Medicare PPO | Source: Ambulatory Visit | Attending: Radiation Oncology | Admitting: Radiation Oncology

## 2020-10-16 ENCOUNTER — Other Ambulatory Visit: Payer: Self-pay

## 2020-10-16 DIAGNOSIS — D0512 Intraductal carcinoma in situ of left breast: Secondary | ICD-10-CM | POA: Diagnosis not present

## 2020-10-19 ENCOUNTER — Encounter: Payer: Self-pay | Admitting: General Practice

## 2020-10-19 ENCOUNTER — Ambulatory Visit: Payer: Medicare PPO

## 2020-10-19 ENCOUNTER — Ambulatory Visit: Payer: Medicare PPO | Admitting: Radiation Oncology

## 2020-10-19 NOTE — Progress Notes (Signed)
[place] Spiritual Care Note  Followed up with Cynthia [name] by phone. She is consistently grateful for pastoral check-ins and notes how stellar she finds the whole team's care, from registration to radiation. She observes less reactivity and more assertiveness in herself recently, especially as she begins to feel the tiring effects of treatment, which gives her a sense of empowerment. We plan to check in by phone in a couple of weeks, and she knows to reach out to her team anytime, as well.   [place], [place], [facility] Pager [phone] Voicemail [phone]

## 2020-10-20 ENCOUNTER — Other Ambulatory Visit: Payer: Self-pay

## 2020-10-20 ENCOUNTER — Ambulatory Visit
Admission: RE | Admit: 2020-10-20 | Discharge: 2020-10-20 | Disposition: A | Discharge status: Home / Self Care | Payer: Medicare PPO | Source: Ambulatory Visit | Attending: Radiation Oncology | Admitting: Radiation Oncology

## 2020-10-20 DIAGNOSIS — D0512 Intraductal carcinoma in situ of left breast: Secondary | ICD-10-CM | POA: Diagnosis not present

## 2020-10-21 ENCOUNTER — Other Ambulatory Visit: Payer: Self-pay

## 2020-10-21 ENCOUNTER — Ambulatory Visit
Admission: RE | Admit: 2020-10-21 | Discharge: 2020-10-21 | Disposition: A | Discharge status: Home / Self Care | Payer: Medicare PPO | Source: Ambulatory Visit | Attending: Radiation Oncology | Admitting: Radiation Oncology

## 2020-10-21 DIAGNOSIS — D0512 Intraductal carcinoma in situ of left breast: Secondary | ICD-10-CM | POA: Diagnosis not present

## 2020-10-22 ENCOUNTER — Encounter: Payer: Self-pay | Admitting: Neurology

## 2020-10-22 ENCOUNTER — Ambulatory Visit: Payer: Medicare PPO | Admitting: Neurology

## 2020-10-22 ENCOUNTER — Ambulatory Visit
Admission: RE | Admit: 2020-10-22 | Discharge: 2020-10-22 | Disposition: A | Discharge status: Home / Self Care | Payer: Medicare PPO | Source: Ambulatory Visit | Attending: Radiation Oncology | Admitting: Radiation Oncology

## 2020-10-22 VITALS — BP 138/80

## 2020-10-22 DIAGNOSIS — D0512 Intraductal carcinoma in situ of left breast: Secondary | ICD-10-CM | POA: Diagnosis not present

## 2020-10-22 DIAGNOSIS — R202 Paresthesia of skin: Secondary | ICD-10-CM

## 2020-10-22 DIAGNOSIS — R569 Unspecified convulsions: Secondary | ICD-10-CM | POA: Diagnosis not present

## 2020-10-22 DIAGNOSIS — I6990 Unspecified sequelae of unspecified cerebrovascular disease: Secondary | ICD-10-CM | POA: Diagnosis not present

## 2020-10-22 MED ORDER — LEVETIRACETAM 250 MG PO TABS: 250.0000 mg | ORAL_TABLET | ORAL | 2 refills | Status: DC

## 2020-10-22 NOTE — Patient Instructions (Signed)
I had a long discussion with the patient regarding her remote intracerebral hemorrhage and left body paresthesias possible simple partial seizures which have done well on Keppra but she has been episode free for no more than 4 years hence I think it may be worthwhile tapering and stopping the Keppra if she can tolerated without any breakthrough episodes.  I recommend she finish her current bottle of Keppra Exar 5 mg daily and then switch to  250 mg daily for a few months before we stop it.  Check EEG to look for any electrical irritability in the brain.  Continue strict control of hypertension with blood pressure goal below 140/90.  She will return for follow-up in 3 months or call earlier if necessary.

## 2020-10-22 NOTE — Progress Notes (Signed)
[facility]  PATIENT: Twylia Oka DOB: [birth year]   REASON FOR VISIT: Follow-up for complaints of numbness left side thought to be  partial seizures with history of intracerebral hemorrhage in July 2017 HISTORY FROM: Patient    HISTORY OF PRESENT ILLNESS:UPDATE 9/30/2019CM [name], 75 year old female returns for follow-up with history of intracranial hemorrhage in July 2017.  She is also had episodes suggestive of simple partial seizures none recently.  She is currently on Keppra 500 mg XR daily.  She is on aspirin for secondary stroke prevention without recurrent stroke or TIA symptoms.  She has no bruising and no bleeding.  Blood pressure well controlled in the office today at 123/68.  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  She has a lot of anxiety related to an alcoholic husband.  She has Xanax to take as needed.  She exercises maybe 3 times a week.  She has stopped her yoga class was encouraged to get back into that.  She complains of decreased energy level.  She denies any visual difficulty speech or swallowing problems balance issues or falls.  No interval medical issues.  She returns for reevaluation   UPDATE 09/27/2018CM [name], 75 year old female returns for follow-up with a history of intracranial hemorrhage in July 2017. She also has a history of episodes suggestive of simple partial seizures and was started on Keppra 500 mg daily. She has not had further episodes.She denies side effects to the medication. She remains on aspirin for secondary stroke prevention without recurrent stroke or TIA symptoms. She has minimal bruising and no bleeding.Blood pressure in the office today 129/71. She continues to teach part time. She denies any speech disturbance swallowing problems, visual difficulties, weakness, balance issues or falls. She returns for reevaluation  UPDATE 03/27/2018CM [name], 75 year old female returns for follow-up with a history  of intracranial hemorrhage July 2017. When last seen she was having episodes of numbness left side lasting 5-10 minutes. EEG was ordered showing focal slowing on the right side which is to be expected given her previous intracerebral hemorrhage. She had another episode of transient paresthesias starting in the left shoulder and spreading gradually down into the fingertips of the left hand over   a few minutes. These episodes are suggestive of simple partial seizure. She was started on Keppra after her EEG was performed , she is currently taking Keppra XR 500 mg daily even though the EEG was negative for seizures. She has had one similar episode since it lasted several minutes shortly after starting the [place]. She remains on aspirin secondary stroke prevention. She denies any weakness speech disturbance swallowing problems visual difficulties and falls or balance issues. 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, 75 year old female returns for follow-up with new complaints of numbness left side lasting 5-10 minutes. She has had 2 of these events in the last 2 months. She had hospital admission in July 2017 for intracranial hemorrhage. Follow-up angiogram in November revealed no AV malformation or aneurysms. Her blood pressure does fluctuate however is well controlled in the office today at 122/64. She has had episodes where her left arm from her shoulder to her hand became numb and tingling however she denied any weakness with this ,one time her face was involved there again numbness but no weakness. She denies any speech disturbance, swallowing problems visual difficulty, problems with balance or falls or other associated symptoms. She denies headache. Her aspirin 0.81 was  restarted by her primary care physician on 09/22/2016. 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 75 year old right-handed woman who presented to the [facility] Emergency department after being sent from her doctor's office due to concern for possible stroke.The patient reported that the night of 03/19/2016 (LKW, time unknown) she developed a sense of feeling disoriented and having difficulty speaking. When asked about her speech, she described that she had trouble focusing her thoughts and getting her words out. She reported that her sister also noticed that she seemed slow to respond to questions. She felt as if her speech was somewhat slurred as well. She did not endorse any weakness, numbness, vision changes, difficulty swallowing, or balance impairments. However, she had noticed that she will find her left arm in unusual postures and positions without being aware that it is doing anything. When she looked at it, she was able to control it without a problem and again denied any weakness or sensory changes. She also had a headache for the past 2 days. She denies any similar previous episodes. She denied any recent trauma to the head. She did note that she was working with her brother at her home and had been up on a ladder. She was getting off the ladder, she thought she was on the ground but instead was on the second step of the latter when she stumbled backwards. She says that she struck her bottom on a nearby chair, then fell to the floor landing on her bottom. She did not strike her head. She has not had any recent illness. She does not use daily antiplatelet therapy or anticoagulation. She uses occasional Advil for headache and pain and has taken a total of six 200 mg tablets since last night. She also reports that she took a baby aspirin earlier today as recommended by her sister because of her symptoms.CT brain on admission showed a large right frontal ICH. Patient was not administered IV t-PA  secondary to [place]. She was admitted to the neuro ICU for further evaluation and treatment. Her blood pressure was tightly controlled initially with IV drip and subsequently with oral medications. Follow-up CT scan showed stable appearance of the hemorrhage. There was mild cytotoxic edema but no hydrocephalus or any hemorrhage expansion. CT angiogram and CT venogram did not show any aneurysms or venous sinus thrombosis but there were engorged vessels in the region of the hemorrhage raising concern about a small AVM hence cerebral catheter angiogram was performed and findings of which are also indeterminate and showed abnormal right frontopolar branches .She had persistent left-sided weakness but her speech and word finding difficulties improved. She was transferred to the neurology floor in a condition remained stable. She was seen by physical occupational therapist and felt to be a good candidate for inpatient rehabilitation. She was accepted for transfer to rehabilitation in stable condition. She states she's done well since discharge. She has patient outpatient physical and occupational therapy. She is back to her baseline and has no complaints or deficits from her intracerebral hemorrhage. She does feel she gets tired easily and at times her left-sided balance and coordination may be off. She states her blood pressure is well controlled and today it is 137/87 in office. She is wondering if she needs follow-up imaging studies. She has no complaints  Update 06/17/2019 :  She returns for follow-up after last visit a year ago with Cecille Rubin, nurse practitioner.  She states she is doing well and blood pressure is well controlled.  She had one brief episode of tingling in her left calf a few days ago.  It stayed localized and did not progress up to involve the rest of the left leg or the upper extremity.  She states a few days ago she had somewhat similar minor episode as well.  She does complain of having cramps in  her legs and thinks it may be due to dehydration as she is not drinking enough fluids.  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  She is tolerating Keppra XR 500 mg once daily very well without any side effects.  No other new complaints. Update 10/22/2020: She returns for follow-up after last visit in October 2020.  She is doing well she has had no episodes of paresthesias since starting Keppra in February 2018.  She has been diagnosed with intraductal breast cancer and underwent lumpectomy in December last year and is currently finishing her course of radiation.  She complains of feeling tired and fatigued wonders if this is from her stroke.  She is tolerating Keppra well without any side effects.  Her blood pressures well controlled and today it is 138/80.  She has no other new new neurological complaints.  She is wondering if she can stop Keppra and if she needs it since she has been episode free for 4 years REVIEW OF SYSTEMS: Full 14 system review of systems performed and notable only for those listed, all others are neg:  Tingling, numbness, fatigue, breast surgery, radiation for breast cancer, stress ALLERGIES: Allergies  Allergen Reactions  . Other Anaphylaxis    FIRE ANTS Insect stings / bites  . Acyclovir And Related     Unknown reaction  . Codeine     Syncope   . Penicillins Rash    Has patient had a PCN reaction causing immediate rash, facial/tongue/throat swelling, SOB or lightheadedness with hypotension: Unknown Has patient had a PCN reaction causing severe rash involving mucus membranes or skin necrosis: No Has patient had a PCN reaction that required hospitalization No Has patient had a PCN reaction occurring within the last 10 years: No If all of the above answers are  "NO", then may proceed with Cephalosporin use.     HOME MEDICATIONS: Outpatient Medications Prior to Visit  Medication Sig Dispense Refill  . acetaminophen (TYLENOL) 500 MG tablet Take 1,000 mg by mouth every 6 (six) hours as needed for moderate pain or headache.    . alendronate (FOSAMAX) 70 MG tablet TAKE 1 TABLET BY MOUTH EVERY 7 DAYS. TAKE WITH FULL GLASS OF WATER ON AN EMPTY STOMACH (Patient taking differently: Take 70 mg by mouth every Sunday.) 12 tablet 3  . ALPRAZolam (XANAX) 0.5 MG tablet TAKE 1/2 TABLET BY MOUTH 3 TIMES A DAY AS NEEDED FOR ANXIETY 45 tablet 0  . Calcium Carb-Cholecalciferol (CALCIUM CARBONATE-VITAMIN D3) 600-400 MG-UNIT TABS Take 1 tablet by mouth 2 (two) times daily.    . Cetirizine HCl 10 MG TBDP Take 1 tablet by mouth at bedtime. Aller-Tec (from [facility])    . EPIPEN 2-PAK 0.3 MG/0.3ML SOAJ injection USE AS DIRECTED 2 each 1  .  famotidine (PEPCID) 20 MG tablet TAKE 1 TABLET BY MOUTH TWICE A DAY 180 tablet 2  . ferrous sulfate 325 (65 FE) MG tablet Take 325 mg by mouth daily.    Marland Kitchen losartan (COZAAR) 100 MG tablet TAKE 1 TABLET BY MOUTH EVERY DAY (Patient taking differently: Take 100 mg by mouth daily.) 90 tablet 3  . traMADol (ULTRAM) 50 MG tablet Take 1 tablet (50 mg total) by mouth every 6 (six) hours as needed for moderate pain or severe pain. 15 tablet 1  . zinc gluconate 50 MG tablet Take 50 mg by mouth daily.    Marland Kitchen levETIRAcetam (KEPPRA XR) 500 MG 24 hr tablet TAKE 1 TABLET BY MOUTH EVERY DAY (Patient taking differently: Take 500 mg by mouth daily.) 90 tablet 2  . meloxicam (MOBIC) 15 MG tablet Take 1 tablet (15 mg total) by mouth daily. 30 tablet 0  . vitamin B-12 (CYANOCOBALAMIN) 1000 MCG tablet Take 1,000 mcg by mouth daily.     No facility-administered medications prior to visit.    PAST MEDICAL HISTORY: Past Medical History:  Diagnosis Date  . Breast cancer ([place])   . Colon polyps   . Depression    history of  . Diverticulitis   . GERD  (gastroesophageal reflux disease)   . Hypertension   . Osteoporosis   . Seizures ([place])   . Stroke [facility])     PAST SURGICAL HISTORY: Past Surgical History:  Procedure Laterality Date  . BREAST LUMPECTOMY WITH RADIOACTIVE SEED LOCALIZATION Left 08/28/2020   Procedure: LEFT BREAST LUMPECTOMY WITH RADIOACTIVE SEED LOCALIZATION;  Surgeon: Stark Klein, MD;  Location: [facility];  Service: General;  Laterality: Left;  RNFA  . IR GENERIC HISTORICAL  08/05/2016   IR ANGIO VERTEBRAL SEL VERTEBRAL UNI R MOD SED 08/05/2016 Luanne Bras, MD [facility]INTER[facility]  . IR GENERIC HISTORICAL  08/05/2016   IR ANGIO INTRA EXTRACRAN SEL COM CAROTID INNOMINATE BILAT MOD SED 08/05/2016 Luanne Bras, MD [facility]  . IR GENERIC HISTORICAL  08/05/2016   IR ANGIO VERTEBRAL SEL SUBCLAVIAN INNOMINATE UNI L MOD SED 08/05/2016 Luanne Bras, MD [facility]  . MOUTH SURGERY    . TONSILLECTOMY    . TUBAL LIGATION      FAMILY HISTORY: Family History  Problem Relation Age of Onset  . Heart disease Mother   . Cancer Mother        lung  . Stroke Paternal Grandmother   . Stomach cancer Maternal Grandfather     SOCIAL HISTORY: Social History   Socioeconomic History  . Marital status: Married    Spouse name: Not on file  . Number of children: 3  . Years of education: Not on file  . Highest education level: Not on file  Occupational History    Comment: teacher, retired  Tobacco Use  . Smoking status: Never Smoker  . Smokeless tobacco: Never Used  Vaping Use  . Vaping Use: Never used  Substance and Sexual Activity  . Alcohol use: Yes    Alcohol/week: 1.0 standard drink    Types: 1 Glasses of wine per week    Comment: 1-2 wine in evening  . Drug use: Not Currently  . Sexual activity: Not Currently    Comment: Married since 2005  Other Topics Concern  . Not on file  Social History Narrative   Lives with husband and son   Right Handed   Drinks 1-2 cups daily.    Social Determinants of  Health   Financial Resource Strain: Not  on file  Food Insecurity: Not on file  Transportation Needs: Not on file  Physical Activity: Not on file  Stress: Not on file  Social Connections: Not on file  Intimate Partner Violence: Not on file     PHYSICAL EXAM  There were no vitals filed for this visit. There is no height or weight on file to calculate BMI.  Generalized: Pleasant elderly Caucasian lady n no acute distress  Head: normocephalic and atraumatic,.  Neck: Supple, no carotid bruits  Cardiac: Regular rate rhythm, no murmur  Skin no rash or petechiae Musculoskeletal: No deformity   Neurological examination   Mentation: Alert oriented to time, place, and person. Attention span and concentration appropriate. Recent and remote memory intact.  Follows all commands speech and language fluent.   Cranial nerve II-XII: Funduscopic exam not done pupils were equal round reactive to light extraocular movements were full, visual field were full on confrontational test. Facial sensation and strength were normal. hearing was intact to finger rubbing bilaterally. Uvula tongue midline. head turning and shoulder shrug were normal and symmetric.Tongue protrusion into cheek strength was normal. Motor: normal bulk and tone, full strength in the BUE, BLE, diminished fine finger movements on the left.  Orbits right over left upper extremity. Sensory: normal and symmetric to light touch, pinprick, and  Vibration, in the upper and lower extremities Coordination: finger-nose-finger, heel-to-shin bilaterally, no dysmetria Reflexes: 1+ upper lower and symmetric, plantar responses were flexor bilaterally. Gait and Station: Rising up from seated position without assistance, normal stance,  moderate stride, good arm swing, smooth turning, able to perform tiptoe, and heel walking without difficulty. Tandem gait unable to perform without difficulty.     DIAGNOSTIC DATA (LABS, IMAGING, TESTING) - I reviewed  patient records, labs, notes, testing and imaging myself where available.  Lab Results  Component Value Date   WBC 6.8 08/25/2020   HGB 12.4 08/25/2020   HCT 36.2 08/25/2020   MCV 91.4 08/25/2020   PLT 293 08/25/2020      Component Value Date/Time   NA 141 08/25/2020 1200   K 4.0 08/25/2020 1200   CL 104 08/25/2020 1200   CO2 28 08/25/2020 1200   GLUCOSE 93 08/25/2020 1200   BUN 16 08/25/2020 1200   CREATININE 0.75 08/25/2020 1200   CREATININE 0.74 07/29/2020 1234   CREATININE 0.83 05/25/2020 1111   CALCIUM 9.5 08/25/2020 1200   PROT 6.5 07/29/2020 1234   ALBUMIN 3.6 07/29/2020 1234   AST 20 07/29/2020 1234   ALT 14 07/29/2020 1234   ALKPHOS 60 07/29/2020 1234   BILITOT 0.5 07/29/2020 1234   GFRNONAA >60 08/25/2020 1200   GFRNONAA >60 07/29/2020 1234   GFRNONAA 70 05/25/2020 1111   GFRAA 82 05/25/2020 1111   Lab Results  Component Value Date   CHOL 207 (H) 05/25/2020   HDL 89 05/25/2020   LDLCALC 101 (H) 05/25/2020   TRIG 78 05/25/2020   CHOLHDL 2.3 05/25/2020   Lab Results  Component Value Date   HGBA1C 5.3 03/22/2016      ASSESSMENT AND PLAN 80 year Caucasian lady with parenchymal right frontal intracerebral hemorrhage in July 2017 of indeterminate etiology. She had  complaints of left-sided numbness and tingling from the shoulder to the hand, no weakness was involved thought to be partial seizures .  She is doing well.     PLANI had a long discussion with the patient regarding her remote intracerebral hemorrhage and left body paresthesias possible simple partial seizures which have done well  on Keppra but she has been episode free for no more than 4 years hence I think it may be worthwhile tapering and stopping the Keppra if she can tolerated without any breakthrough episodes.  I recommend she finish her current bottle of Keppra Exar 5 mg daily and then switch to  250 mg daily for a few months before we stop it.  Check EEG to look for any electrical irritability  in the brain.  Continue strict control of hypertension with blood pressure goal below 140/90.  She will return for follow-up in 3 months or call earlier if necessary. Greater than 50% of time during this 25 minute visit was spent on counseling,explanation of diagnosis of simple partial seizure, intracerebral hemorrhage, planning of further management, discussion with patient  and coordination of care for stroke prevention,seizure disorder, discussing seizure precautions, anxiety etc.   Antony Contras, MD  [facility] [address] [place], [place] [address] [phone]

## 2020-10-23 ENCOUNTER — Other Ambulatory Visit: Payer: Self-pay

## 2020-10-23 ENCOUNTER — Ambulatory Visit
Admission: RE | Admit: 2020-10-23 | Discharge: 2020-10-23 | Disposition: A | Discharge status: Home / Self Care | Payer: Medicare PPO | Source: Ambulatory Visit | Attending: Radiation Oncology | Admitting: Radiation Oncology

## 2020-10-23 DIAGNOSIS — D0512 Intraductal carcinoma in situ of left breast: Secondary | ICD-10-CM | POA: Diagnosis not present

## 2020-10-26 ENCOUNTER — Other Ambulatory Visit: Payer: Self-pay

## 2020-10-26 ENCOUNTER — Ambulatory Visit: Payer: Medicare PPO | Admitting: Radiation Oncology

## 2020-10-26 ENCOUNTER — Ambulatory Visit
Admission: RE | Admit: 2020-10-26 | Discharge: 2020-10-26 | Disposition: A | Discharge status: Home / Self Care | Payer: Medicare PPO | Source: Ambulatory Visit | Attending: Radiation Oncology | Admitting: Radiation Oncology

## 2020-10-26 DIAGNOSIS — D0512 Intraductal carcinoma in situ of left breast: Secondary | ICD-10-CM | POA: Diagnosis not present

## 2020-10-26 DIAGNOSIS — H40013 Open angle with borderline findings, low risk, bilateral: Secondary | ICD-10-CM | POA: Diagnosis not present

## 2020-10-27 ENCOUNTER — Other Ambulatory Visit: Payer: Self-pay

## 2020-10-27 ENCOUNTER — Other Ambulatory Visit: Payer: Self-pay | Admitting: Family Medicine

## 2020-10-27 ENCOUNTER — Ambulatory Visit
Admission: RE | Admit: 2020-10-27 | Discharge: 2020-10-27 | Disposition: A | Discharge status: Home / Self Care | Payer: Medicare PPO | Source: Ambulatory Visit | Attending: Radiation Oncology | Admitting: Radiation Oncology

## 2020-10-27 ENCOUNTER — Ambulatory Visit: Payer: Medicare PPO

## 2020-10-27 ENCOUNTER — Ambulatory Visit: Payer: Medicare PPO | Admitting: Radiation Oncology

## 2020-10-27 DIAGNOSIS — D0512 Intraductal carcinoma in situ of left breast: Secondary | ICD-10-CM | POA: Diagnosis not present

## 2020-10-28 ENCOUNTER — Ambulatory Visit
Admission: RE | Admit: 2020-10-28 | Discharge: 2020-10-28 | Disposition: A | Discharge status: Home / Self Care | Payer: Medicare PPO | Source: Ambulatory Visit | Attending: Radiation Oncology | Admitting: Radiation Oncology

## 2020-10-28 ENCOUNTER — Other Ambulatory Visit: Payer: Self-pay

## 2020-10-28 DIAGNOSIS — D0512 Intraductal carcinoma in situ of left breast: Secondary | ICD-10-CM | POA: Diagnosis not present

## 2020-10-29 ENCOUNTER — Other Ambulatory Visit: Payer: Self-pay

## 2020-10-29 ENCOUNTER — Ambulatory Visit
Admission: RE | Admit: 2020-10-29 | Discharge: 2020-10-29 | Disposition: A | Discharge status: Home / Self Care | Payer: Medicare PPO | Source: Ambulatory Visit | Attending: Radiation Oncology | Admitting: Radiation Oncology

## 2020-10-29 DIAGNOSIS — D0512 Intraductal carcinoma in situ of left breast: Secondary | ICD-10-CM | POA: Diagnosis not present

## 2020-10-30 ENCOUNTER — Other Ambulatory Visit: Payer: Self-pay

## 2020-10-30 ENCOUNTER — Ambulatory Visit
Admission: RE | Admit: 2020-10-30 | Discharge: 2020-10-30 | Disposition: A | Discharge status: Home / Self Care | Payer: Medicare PPO | Source: Ambulatory Visit | Attending: Radiation Oncology | Admitting: Radiation Oncology

## 2020-10-30 DIAGNOSIS — D0512 Intraductal carcinoma in situ of left breast: Secondary | ICD-10-CM | POA: Diagnosis not present

## 2020-11-02 ENCOUNTER — Other Ambulatory Visit: Payer: Self-pay

## 2020-11-02 ENCOUNTER — Ambulatory Visit
Admission: RE | Admit: 2020-11-02 | Discharge: 2020-11-02 | Disposition: A | Discharge status: Home / Self Care | Payer: Medicare PPO | Source: Ambulatory Visit | Attending: Radiation Oncology | Admitting: Radiation Oncology

## 2020-11-02 DIAGNOSIS — D0512 Intraductal carcinoma in situ of left breast: Secondary | ICD-10-CM

## 2020-11-02 MED ORDER — RADIAPLEXRX EX GEL: Freq: Once | CUTANEOUS | Status: AC

## 2020-11-03 ENCOUNTER — Other Ambulatory Visit: Payer: Self-pay

## 2020-11-03 ENCOUNTER — Encounter: Payer: Self-pay | Admitting: *Deleted

## 2020-11-03 ENCOUNTER — Ambulatory Visit
Admission: RE | Admit: 2020-11-03 | Discharge: 2020-11-03 | Disposition: A | Discharge status: Home / Self Care | Payer: Medicare PPO | Source: Ambulatory Visit | Attending: Radiation Oncology | Admitting: Radiation Oncology

## 2020-11-03 DIAGNOSIS — D0512 Intraductal carcinoma in situ of left breast: Secondary | ICD-10-CM | POA: Diagnosis not present

## 2020-11-04 ENCOUNTER — Other Ambulatory Visit: Payer: Self-pay

## 2020-11-04 ENCOUNTER — Ambulatory Visit: Payer: Medicare PPO

## 2020-11-04 ENCOUNTER — Ambulatory Visit
Admission: RE | Admit: 2020-11-04 | Discharge: 2020-11-04 | Disposition: A | Discharge status: Home / Self Care | Payer: Medicare PPO | Source: Ambulatory Visit | Attending: Radiation Oncology | Admitting: Radiation Oncology

## 2020-11-04 DIAGNOSIS — D0512 Intraductal carcinoma in situ of left breast: Secondary | ICD-10-CM | POA: Diagnosis not present

## 2020-11-05 ENCOUNTER — Ambulatory Visit: Payer: Medicare PPO

## 2020-11-05 ENCOUNTER — Encounter: Payer: Self-pay | Admitting: Radiation Oncology

## 2020-11-05 ENCOUNTER — Ambulatory Visit
Admission: RE | Admit: 2020-11-05 | Discharge: 2020-11-05 | Disposition: A | Discharge status: Home / Self Care | Payer: Medicare PPO | Source: Ambulatory Visit | Attending: Radiation Oncology | Admitting: Radiation Oncology

## 2020-11-05 ENCOUNTER — Other Ambulatory Visit: Payer: Self-pay

## 2020-11-05 DIAGNOSIS — D0512 Intraductal carcinoma in situ of left breast: Secondary | ICD-10-CM | POA: Diagnosis not present

## 2020-11-06 ENCOUNTER — Ambulatory Visit: Payer: Medicare PPO

## 2020-11-09 ENCOUNTER — Ambulatory Visit: Payer: Medicare PPO

## 2020-11-09 ENCOUNTER — Encounter: Payer: Self-pay | Admitting: General Practice

## 2020-11-09 NOTE — Progress Notes (Signed)
[facility] Spiritual Care Note  Attempted follow-up pastoral call. Left voicemail encouraging return call.   [place], [place], [facility] Pager [phone] Voicemail [phone]

## 2020-11-10 ENCOUNTER — Ambulatory Visit: Payer: Medicare PPO

## 2020-11-11 ENCOUNTER — Ambulatory Visit: Payer: Medicare PPO

## 2020-11-12 ENCOUNTER — Ambulatory Visit: Payer: Medicare PPO

## 2020-11-13 ENCOUNTER — Ambulatory Visit: Payer: Medicare PPO

## 2020-11-16 ENCOUNTER — Ambulatory Visit: Payer: Medicare PPO | Admitting: Neurology

## 2020-11-16 ENCOUNTER — Ambulatory Visit: Payer: Medicare PPO

## 2020-11-16 DIAGNOSIS — R569 Unspecified convulsions: Secondary | ICD-10-CM

## 2020-11-16 DIAGNOSIS — I6990 Unspecified sequelae of unspecified cerebrovascular disease: Secondary | ICD-10-CM

## 2020-11-16 DIAGNOSIS — R202 Paresthesia of skin: Secondary | ICD-10-CM

## 2020-11-17 ENCOUNTER — Encounter: Payer: Self-pay | Admitting: *Deleted

## 2020-11-17 ENCOUNTER — Ambulatory Visit: Payer: Medicare PPO

## 2020-11-17 NOTE — Progress Notes (Signed)
Kindly inform the patient that EEG study was normal

## 2020-11-18 ENCOUNTER — Ambulatory Visit: Payer: Medicare PPO

## 2020-11-19 ENCOUNTER — Ambulatory Visit: Payer: Medicare PPO

## 2020-11-20 ENCOUNTER — Ambulatory Visit: Payer: Medicare PPO

## 2020-11-23 ENCOUNTER — Ambulatory Visit: Payer: Medicare PPO

## 2020-11-29 ENCOUNTER — Other Ambulatory Visit: Payer: Self-pay | Admitting: Family Medicine

## 2020-12-08 ENCOUNTER — Telehealth: Payer: Self-pay

## 2020-12-08 NOTE — Telephone Encounter (Signed)
I called the patient today about her upcoming follow-up appointment in radiation oncology.   Given the state of the COVID-19 pandemic, concerning case numbers in our community, and guidance from [facility], I offered a phone assessment with the patient to determine if coming to the clinic was necessary. She accepted.  I let the patient know that I had spoken with Dr. Isidore Moos, and she wanted them to know the importance of washing their hands for at least 20 seconds at a time, especially after going out in public, and before they eat.  Limit going out in public whenever possible. Do not touch your face, unless your hands are clean, such as when bathing. Get plenty of rest, eat well, and stay hydrated. Patient verbalized understanding and agreement  The patient denies any symptomatic concerns. She reports her fatigue has finally started resolving within the past week, and she's hopeful it will continue to improve. She reports an occasional twinge near her surgical site, but states it is not a new concern and resolves quickly. She denies any residual swelling to her breast or issues with range of motion to her left arm/shoulder. Specifically, she reports good healing of her skin in the radiation fields.  Skin is intact and starting to return to her baseline coloring/texture. I recommended that she continue skin care by applying oil or lotion with vitamin E to the skin in the radiation fields, BID, for 2 more months.  Patient verbalized understanding and agreement.  Continue follow-up with medical oncology - follow-up is scheduled on 03/01/2021 with Dr. Lurline Del.  I explained that yearly mammograms are important for patients with intact breast tissue, and physical exams are important after mastectomy for patients that cannot undergo mammography.  I encouraged her to call if she had further questions or concerns about her healing. Otherwise, she will follow-up PRN in radiation oncology. Patient  is pleased with this plan, and we will cancel her upcoming follow-up to reduce the risk of COVID-19 transmission.

## 2020-12-09 ENCOUNTER — Ambulatory Visit: Payer: Medicare PPO | Admitting: Radiation Oncology

## 2020-12-30 NOTE — Progress Notes (Signed)
  Patient Name: Cynthia [name] MRN: [id number] DOB: 1946/07/04 Referring Physician: Lurline Del (Profile Not Attached) Date of Service: 11/05/2020 [facility], [place]                                                        End Of Treatment Note  Diagnoses: D05.12-Intraductal carcinoma in situ of left breast  Cancer Staging:  Stage 0  Intent: Curative  Radiation Treatment Dates: 10/08/2020 through 11/05/2020 Site Technique Total Dose (Gy) Dose per Fx (Gy) Completed Fx Beam Energies  Breast, Left: Breast_Lt 3D 40.05/40.05 2.67 15/15 6X  Breast, Left: Breast_Lt_Bst specialPort 10/10 2 5/5 12E, 15E   Narrative: The patient tolerated radiation therapy relatively well.   Plan: The patient will follow-up with radiation oncology in 65mo or prn. -----------------------------------  Eppie Gibson, MD

## 2021-01-08 ENCOUNTER — Other Ambulatory Visit: Payer: Self-pay | Admitting: Family Medicine

## 2021-02-22 ENCOUNTER — Encounter: Payer: Self-pay | Admitting: Neurology

## 2021-02-22 ENCOUNTER — Ambulatory Visit: Payer: Medicare PPO | Admitting: Neurology

## 2021-02-22 VITALS — BP 130/81 | Ht 66.0 in | Wt 152.0 lb

## 2021-02-22 DIAGNOSIS — R569 Unspecified convulsions: Secondary | ICD-10-CM

## 2021-02-22 DIAGNOSIS — I6990 Unspecified sequelae of unspecified cerebrovascular disease: Secondary | ICD-10-CM

## 2021-02-22 NOTE — Patient Instructions (Signed)
I had a long discussion with the patient regarding her remote episodes of paresthesias and possible simple partial seizures and remote intracerebral hemorrhage and answered questions.  She seems to be doing well with tapering of Keppra and I recommend she reduce the dose further to 250 mg every other day for a month and stop it.  If she has breakthrough episodes may need to go back on it.  She will return for follow-up in the future in 6 months or call earlier if necessary.

## 2021-02-22 NOTE — Progress Notes (Signed)
[facility]  PATIENT: Cynthia [name] DOB: [birth year]   REASON FOR VISIT: Follow-up for complaints of numbness left side thought to be  partial seizures with history of intracerebral hemorrhage in July 2017 HISTORY FROM: Patient    HISTORY OF PRESENT ILLNESS:UPDATE 9/30/2019CM Cynthia [name], 75 year old female returns for follow-up with history of intracranial hemorrhage in July 2017.  She is also had episodes suggestive of simple partial seizures none recently.  She is currently on Keppra 500 mg XR daily.  She is on aspirin for secondary stroke prevention without recurrent stroke or TIA symptoms.  She has no bruising and no bleeding.  Blood pressure well controlled in the office today at 123/68.  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  She has a lot of anxiety related to an alcoholic husband.  She has Xanax to take as needed.  She exercises maybe 3 times a week.  She has stopped her yoga class was encouraged to get back into that.  She complains of decreased energy level.  She denies any visual difficulty speech or swallowing problems balance issues or falls.  No interval medical issues.  She returns for reevaluation   UPDATE 09/27/2018CM Cynthia [name], 75 year old female returns for follow-up with a history of intracranial hemorrhage in July 2017. She also has a history of episodes suggestive of simple partial seizures and was started on Keppra 500 mg daily. She has not had further episodes.She denies side effects to the medication. She remains on aspirin for secondary stroke prevention without recurrent stroke or TIA symptoms. She has minimal bruising and no bleeding.Blood pressure in the office today 129/71. She continues to teach part time. She denies any speech disturbance swallowing problems, visual difficulties, weakness, balance issues or falls. She returns for reevaluation  UPDATE 03/27/2018CM Cynthia [name], 75 year old female returns for follow-up with a history  of intracranial hemorrhage July 2017. When last seen she was having episodes of numbness left side lasting 5-10 minutes. EEG was ordered showing focal slowing on the right side which is to be expected given her previous intracerebral hemorrhage. She had another episode of transient paresthesias starting in the left shoulder and spreading gradually down into the fingertips of the left hand over   a few minutes. These episodes are suggestive of simple partial seizure. She was started on Keppra after her EEG was performed , she is currently taking Keppra XR 500 mg daily even though the EEG was negative for seizures. She has had one similar episode since it lasted several minutes shortly after starting the [place]. She remains on aspirin secondary stroke prevention. She denies any weakness speech disturbance swallowing problems visual difficulties and falls or balance issues. 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 She has had 2 of these events in the last 2 months. She had hospital admission in July 2017 for intracranial hemorrhage. Follow-up angiogram in November revealed no AV malformation or aneurysms. Her blood pressure does fluctuate however is well controlled in the office today at 122/64. She has had episodes where her left arm from her shoulder to her hand became numb and tingling however she denied any weakness with this ,one time her face was involved there again numbness but no weakness. She denies any speech disturbance, swallowing problems visual difficulty, problems with balance or falls or other associated symptoms. She denies headache. Her aspirin 0.81 was  restarted by her primary care physician on 09/22/2016. 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 75 year old right-handed woman who presented to the [facility] Emergency department after being sent from her doctor's office due to concern for possible stroke.The patient  reported that the night of 03/19/2016 (LKW, time unknown) she developed a sense of feeling disoriented and having difficulty speaking. When asked about her speech, she described that she had trouble focusing her thoughts and getting her words out. She reported that her sister also noticed that she seemed slow to respond to questions. She felt as if her speech was somewhat slurred as well. She did not endorse any weakness, numbness, vision changes, difficulty swallowing, or balance impairments. However, she had noticed that she will find her left arm in unusual postures and positions without being aware that it is doing anything. When she looked at it, she was able to control it without a problem and again denied any weakness or sensory changes. She  also had a headache for the past 2 days.  She denies any similar previous episodes. She denied any recent trauma to the head. She did note that she was working with her brother at her home and had been up on a ladder. She was getting off the ladder, she thought she was on the ground but instead was on the second step of the latter when she stumbled backwards. She says that she struck her bottom on a nearby chair, then fell to the floor landing on her bottom. She did not strike her head. She has not had any recent illness. She does not use daily antiplatelet therapy or anticoagulation. She uses occasional Advil for headache and pain and has taken a total of six 200 mg tablets since last night. She also reports that she took a baby aspirin earlier today as recommended by her sister because of her symptoms.CT brain on admission showed a large right frontal ICH. Patient was not administered IV t-PA  secondary to [place]. She was admitted to the neuro ICU for further evaluation and treatment. Her blood pressure was tightly controlled initially with IV drip and subsequently with oral medications. Follow-up CT scan showed stable appearance of the hemorrhage. There was mild cytotoxic edema but no hydrocephalus or any hemorrhage expansion. CT angiogram and CT venogram did not show any aneurysms or venous sinus thrombosis but there were engorged vessels in the region of the hemorrhage raising concern about a small AVM hence cerebral catheter angiogram was performed and findings of which are also indeterminate and showed abnormal right frontopolar branches .She had persistent left-sided weakness but her speech and word finding difficulties improved. She was transferred to the neurology floor in a condition remained stable. She was seen by physical occupational therapist and felt to be a good candidate for inpatient rehabilitation. She was accepted for transfer to rehabilitation in stable condition. She states she's done well since discharge. She has patient outpatient physical and occupational therapy. She is back to her baseline and has no complaints or deficits from her intracerebral hemorrhage. She does feel she gets tired easily and at times her left-sided balance and coordination may be off. She states her blood pressure is well controlled and today it is 137/87 in office. She is wondering if she needs follow-up imaging studies. She has no complaints  Update 06/17/2019 : She returns for follow-up after last visit a year ago with Cecille Rubin, nurse practitioner.  She states she is doing well and blood pressure is well controlled.  She had one brief episode of tingling in her left calf a few days ago.  It stayed localized and did not progress up to involve the rest of the left leg or the upper extremity.  She states a few days ago she had somewhat similar minor episode as well.  She does complain of having cramps in  her legs and thinks it may be due to dehydration as she is not drinking enough fluids.  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  She is tolerating Keppra XR 500 mg once daily very well without any side effects.  No other new complaints. Update 10/22/2020: She returns for follow-up after last visit in October 2020.  She is doing well she has had no episodes of paresthesias since starting Keppra in February 2018.  She has been diagnosed with intraductal breast cancer and underwent lumpectomy in December last year and is currently finishing her course of radiation.  She complains of feeling tired and fatigued wonders if this is from her stroke.  She is tolerating Keppra well without any side effects.  Her blood pressures well controlled and today it is 138/80.  She has no other new new neurological complaints.  She is wondering if she can stop Keppra and if she needs it since she has been episode free for 4 years  Update 02/22/2021 : She returns for follow-up after last visit 4 months ago.  She has reduced the Keppra initially to 250 twice daily and now has been taking it once a day for the last 6 weeks and has not not had any episodes of numbness or seizure-like episodes.  She has finished her lumpectomy for breast cancer as well as 4 weeks of radiation therapy and an upcoming appointment to see oncologist Dr. Jana Hakim in a few weeks.  She had EEG done on 11/17/2020 which was normal.  She has no complaints except her left foot little toe is swollen and red since this morning.  REVIEW OF SYSTEMS: Full 14 system review of systems performed and notable only for those listed, all others are neg:  Tingling, numbness, fatigue, breast surgery, radiation for breast cancer, stress ALLERGIES: Allergies  Allergen  Reactions   Other Anaphylaxis    FIRE ANTS Insect stings / bites   Acyclovir And Related     Unknown reaction   Codeine     Syncope    Penicillins Rash    Has patient had a PCN reaction causing immediate rash, facial/tongue/throat swelling, SOB or lightheadedness with hypotension: Unknown Has patient had a PCN reaction causing severe rash involving mucus membranes or skin necrosis: No Has patient had a PCN reaction that required hospitalization No Has patient had a PCN reaction occurring within the last 10 years: No If all of the above answers are "NO", then may proceed with Cephalosporin use.     HOME MEDICATIONS: Outpatient Medications Prior to Visit  Medication Sig Dispense Refill   acetaminophen (TYLENOL) 500 MG tablet Take 1,000 mg by mouth every 6 (six) hours as needed for moderate pain or headache.  alendronate (FOSAMAX) 70 MG tablet TAKE 1 TABLET BY MOUTH EVERY 7 DAYS. TAKE WITH FULL GLASS OF WATER ON AN EMPTY STOMACH (Patient taking differently: Take 70 mg by mouth every Sunday.) 12 tablet 3   ALPRAZolam (XANAX) 0.5 MG tablet TAKE 1/2 TABLET BY MOUTH 3 TIMES A DAY AS NEEDED FOR ANXIETY 45 tablet 0   Calcium Carb-Cholecalciferol (CALCIUM CARBONATE-VITAMIN D3) 600-400 MG-UNIT TABS Take 1 tablet by mouth 2 (two) times daily.     Cetirizine HCl 10 MG TBDP Take 1 tablet by mouth at bedtime. Aller-Tec (from [employer])     EPIPEN 2-PAK 0.3 MG/0.3ML SOAJ injection USE AS DIRECTED 2 each 1   famotidine (PEPCID) 20 MG tablet TAKE 1 TABLET BY MOUTH TWICE A DAY 180 tablet 2   ferrous sulfate 325 (65 FE) MG tablet Take 325 mg by mouth daily.     losartan (COZAAR) 100 MG tablet TAKE 1 TABLET BY MOUTH EVERY DAY (Patient taking differently: Take 100 mg by mouth daily.) 90 tablet 3   vitamin B-12 (CYANOCOBALAMIN) 1000 MCG tablet Take 1,000 mcg by mouth daily.     zinc gluconate 50 MG tablet Take 50 mg by mouth daily.     levETIRAcetam (KEPPRA) 250 MG tablet Take 1 tablet (250 mg total) by  mouth 1 day or 1 dose for 1 dose. 30 tablet 2   meloxicam (MOBIC) 15 MG tablet Take 1 tablet (15 mg total) by mouth daily. 30 tablet 0   traMADol (ULTRAM) 50 MG tablet Take 1 tablet (50 mg total) by mouth every 6 (six) hours as needed for moderate pain or severe pain. 15 tablet 1   No facility-administered medications prior to visit.    PAST MEDICAL HISTORY: Past Medical History:  Diagnosis Date   Breast cancer ([place])    Colon polyps    Depression    history of   Diverticulitis    GERD (gastroesophageal reflux disease)    Hypertension    Osteoporosis    Seizures ([place])    Stroke ([place])     PAST SURGICAL HISTORY: Past Surgical History:  Procedure Laterality Date   BREAST LUMPECTOMY WITH RADIOACTIVE SEED LOCALIZATION Left 08/28/2020   Procedure: LEFT BREAST LUMPECTOMY WITH RADIOACTIVE SEED LOCALIZATION;  Surgeon: Stark Klein, MD;  Location: [place];  Service: General;  Laterality: Left;  RNFA   IR GENERIC HISTORICAL  08/05/2016   IR ANGIO VERTEBRAL SEL VERTEBRAL UNI R MOD SED 08/05/2016 Luanne Bras, MD [facility]   IR GENERIC HISTORICAL  08/05/2016   IR ANGIO INTRA EXTRACRAN SEL COM CAROTID INNOMINATE BILAT MOD SED 08/05/2016 Luanne Bras, MD [facility]   IR GENERIC HISTORICAL  08/05/2016   IR ANGIO VERTEBRAL SEL SUBCLAVIAN INNOMINATE UNI L MOD SED 08/05/2016 Luanne Bras, MD [facility]   MOUTH SURGERY     TONSILLECTOMY     TUBAL LIGATION      FAMILY HISTORY: Family History  Problem Relation Age of Onset   Heart disease Mother    Cancer Mother        lung   Stroke Paternal Grandmother    Stomach cancer Maternal Grandfather     SOCIAL HISTORY: Social History   Socioeconomic History   Marital status: Married    Spouse name: Not on file   Number of children: 3   Years of education: Not on file   Highest education level: Not on file  Occupational History    Comment: Pharmacist, hospital, retired  Tobacco Use   Smoking status: Never  Smokeless tobacco:  Never  Vaping Use   Vaping Use: Never used  Substance and Sexual Activity   Alcohol use: Yes    Alcohol/week: 1.0 standard drink    Types: 1 Glasses of wine per week    Comment: 1-2 wine in evening   Drug use: Not Currently   Sexual activity: Not Currently    Comment: Married since 2005  Other Topics Concern   Not on file  Social History Narrative   Lives with husband and son   Right Handed   Drinks 1-2 cups daily.    Social Determinants of Health   Financial Resource Strain: Not on file  Food Insecurity: Not on file  Transportation Needs: Not on file  Physical Activity: Not on file  Stress: Not on file  Social Connections: Not on file  Intimate Partner Violence: Not on file     PHYSICAL EXAM  Vitals:   02/22/21 1315  Weight: 152 lb (68.9 kg)  Height: 5\' 6"  (1.676 m)   Body mass index is 24.53 kg/m.  Generalized: Pleasant elderly Caucasian lady n no acute distress  Head: normocephalic and atraumatic,.  Neck: Supple, no carotid bruits  Cardiac: Regular rate rhythm, no murmur  Skin no rash or petechiae Musculoskeletal: No deformity   Neurological examination   Mentation: Alert oriented to time, place, and person. Attention span and concentration appropriate. Recent and remote memory intact.  Follows all commands speech and language fluent.   Cranial nerve II-XII: Funduscopic exam not done .Pupils were equal round reactive to light extraocular movements were full, visual field were full on confrontational test. Facial sensation and strength were normal. hearing was intact to finger rubbing bilaterally. Uvula tongue midline. head turning and shoulder shrug were normal and symmetric.Tongue protrusion into cheek strength was normal. Motor: normal bulk and tone, full strength in the BUE, BLE, diminished fine finger movements on the left.  Orbits right over left upper extremity. Sensory: normal and symmetric to light touch, pinprick, and  Vibration, in the upper and lower  extremities Coordination: finger-nose-finger, heel-to-shin bilaterally, no dysmetria Reflexes: 1+ upper lower and symmetric, plantar responses were flexor bilaterally. Gait and Station: Rising up from seated position without assistance, normal stance,  moderate stride, good arm swing, smooth turning, able to perform tiptoe, and heel walking without difficulty. Tandem gait unable to perform without difficulty.     DIAGNOSTIC DATA (LABS, IMAGING, TESTING) - I reviewed patient records, labs, notes, testing and imaging myself where available.  Lab Results  Component Value Date   WBC 6.8 08/25/2020   HGB 12.4 08/25/2020   HCT 36.2 08/25/2020   MCV 91.4 08/25/2020   PLT 293 08/25/2020      Component Value Date/Time   NA 141 08/25/2020 1200   K 4.0 08/25/2020 1200   CL 104 08/25/2020 1200   CO2 28 08/25/2020 1200   GLUCOSE 93 08/25/2020 1200   BUN 16 08/25/2020 1200   CREATININE 0.75 08/25/2020 1200   CREATININE 0.74 07/29/2020 1234   CREATININE 0.83 05/25/2020 1111   CALCIUM 9.5 08/25/2020 1200   PROT 6.5 07/29/2020 1234   ALBUMIN 3.6 07/29/2020 1234   AST 20 07/29/2020 1234   ALT 14 07/29/2020 1234   ALKPHOS 60 07/29/2020 1234   BILITOT 0.5 07/29/2020 1234   GFRNONAA >60 08/25/2020 1200   GFRNONAA >60 07/29/2020 1234   GFRNONAA 70 05/25/2020 1111   GFRAA 82 05/25/2020 1111   Lab Results  Component Value Date   CHOL 207 (H) 05/25/2020  HDL 89 05/25/2020   LDLCALC 101 (H) 05/25/2020   TRIG 78 05/25/2020   CHOLHDL 2.3 05/25/2020   Lab Results  Component Value Date   HGBA1C 5.3 03/22/2016      ASSESSMENT AND PLAN 53 year Caucasian lady with parenchymal right frontal intracerebral hemorrhage in July 2017 of indeterminate etiology. She had  complaints of left-sided numbness and tingling from the shoulder to the hand, no weakness was involved thought to be partial seizures but has been episode free for more than 4 years on Keppra which is not being tapered.     PLANI I  had a long discussion with the patient regarding her remote episodes of paresthesias and possible simple partial seizures and remote intracerebral hemorrhage and answered questions.  She seems to be doing well with tapering of Keppra and I recommend she reduce the dose further to 250 mg every other day for a month and stop it.  If she has breakthrough episodes may need to go back on it.  She will return for follow-up in the future in 6 months or call earlier if necessary. Greater than 50% of time during this 25 minute visit was spent on counseling,explanation of diagnosis of simple partial seizure, intracerebral hemorrhage, planning of further management, discussion with patient  and coordination of care for stroke prevention,seizure disorder, discussing seizure precautions, anxiety etc.   Antony Contras, MD  [facility] [address] [place], [place] [address] [phone]

## 2021-02-28 NOTE — Progress Notes (Signed)
Cynthia [name]  [phone] [phone]     ID: Cynthia [name] DOB: 05-28-1946  MR#: [id number]  NUU#:[id number]  Patient Care Team: Cynthia Frizzle, MD as PCP - General (Family Medicine) Cynthia Kaufmann, RN as Oncology Nurse Navigator Cynthia Germany, RN as Oncology Nurse Navigator Cynthia Klein, MD as Consulting Physician (General Surgery) Cynthia [name], Cynthia Dad, MD as Consulting Physician (Oncology) Cynthia Gibson, MD as Attending Physician (Radiation Oncology) Cynthia [name] as Consulting Physician (Optometry) Cynthia Fila, MD as Consulting Physician (Neurology) Cynthia [name], DMD as Consulting Physician (Dentistry) Cynthia Cruel, MD OTHER MD:  CHIEF COMPLAINT: noninvasive breast cancer, functionally estrogen receptor negative  CURRENT TREATMENT: observation   INTERVAL HISTORY: Cynthia [name] returns today for follow up of her noninvasive breast cancer. She was evaluated in the multidisciplinary breast cancer clinic on 07/29/2020.  She proceeded to left lumpectomy on 08/28/2020 under Cynthia [name]. Pathology from the procedure [id number]) showed: ductal carcinoma in situ, high grade, 0.25 cm; margins uninvolved.  She was referred back to Dr. Isidore [name] on 09/29/2020 to review radiation therapy. She subsequently received treatment from 10/08/2020 through 11/05/2020.  Of note, she underwent bone density screening on 08/12/2020 at [facility] showing a T-score of -1.6, which is considered osteopenic.   REVIEW OF SYSTEMS: Cynthia [name] recovered very nicely from her surgery and is very pleased cosmetically with the result.  She is now having to take care of her husband who is having significant medical problems and she is getting the appropriate support she needs.  She walks the dog for exercise.  She tells me her seizures appear to have resolved and her Keppra is being tapered to off.  A detailed review of systems today was otherwise stable   COVID 19  VACCINATION STATUS: Moderna x2, most recently March 2021   HISTORY OF CURRENT ILLNESS: From the original intake note:  Cynthia [name] had routine screening mammography on 07/06/2020 showing calcifications in the left breast. She underwent left diagnostic mammography with tomography at [facility] on 07/09/2020 showing: breast density category B; 1 cm grouped calcifications in left breast at 12 o'clock.  Accordingly on 07/22/2020 she proceeded to biopsy of the left breast area in question. The pathology from this procedure ([id number]) showed: ductal carcinoma in situ, high grade, with central necrosis and calcifications. Prognostic indicators significant for: estrogen receptor, 20% positive with weak staining intensity and progesterone receptor, 0% negative.  The patient's subsequent history is as detailed below.   PAST MEDICAL HISTORY: Past Medical History:  Diagnosis Date   Breast cancer ([place])    Colon polyps    Depression    history of   Diverticulitis    GERD (gastroesophageal reflux disease)    Hypertension    Osteoporosis    Seizures ([place])    Stroke ([place])     PAST SURGICAL HISTORY: Past Surgical History:  Procedure Laterality Date   BREAST LUMPECTOMY WITH RADIOACTIVE SEED LOCALIZATION Left 08/28/2020   Procedure: LEFT BREAST LUMPECTOMY WITH RADIOACTIVE SEED LOCALIZATION;  Surgeon: Cynthia Klein, MD;  Location: [place];  Service: General;  Laterality: Left;  RNFA   IR GENERIC HISTORICAL  08/05/2016   IR ANGIO VERTEBRAL SEL VERTEBRAL UNI R MOD SED 08/05/2016 Cynthia Bras, MD [facility]   IR GENERIC HISTORICAL  08/05/2016   IR ANGIO INTRA EXTRACRAN SEL COM CAROTID INNOMINATE BILAT MOD SED 08/05/2016 Cynthia Bras, MD [facility]   IR GENERIC HISTORICAL  08/05/2016   IR ANGIO VERTEBRAL SEL SUBCLAVIAN INNOMINATE UNI L MOD SED 08/05/2016  Cynthia Bras, MD [facility]   MOUTH SURGERY     TONSILLECTOMY     TUBAL LIGATION      FAMILY HISTORY: Family History   Problem Relation Age of Onset   Heart disease Mother    Cancer Mother        lung   Stroke Paternal Grandmother    Stomach cancer Maternal Grandfather    Her father died at age 69 from alcoholism. Her mother died at age 53 from lung cancer. Cynthia [name] has one brother and had two sisters (one is deceased). Aside from the cancer in her mother, she also reports stomach cancer in her maternal grandfather at age 13. There is no family history of breast or ovarian cancer to her knowledge.   GYNECOLOGIC HISTORY:  No LMP recorded. Patient is postmenopausal. Menarche: 75 years old Age at first live birth: 75 years old [place] P 2 LMP 05-Dec-2003 Contraceptive: used from "1970's to early 1980's" HRT never used  Hysterectomy? no BSO? 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 Her first husband passed away in 12-04-2000. Her second and current husband Cynthia [name] is a retired Network engineer. Son Cynthia [name], age 50, works for [employer] (a Retail banker) and [employer] in [place], [place]. Son Cynthia [name], age 33, is a Freight forwarder at [employer] in [place] and lives in [place]. Cynthia [name] has a total of 5 grandchildren-- son Cynthia [name] has two sons, and husband Cynthia [name] has 3 granddaughters.    ADVANCED DIRECTIVES: At the 07/29/2020 visit the patient was given the appropriate documents to complete and notarize at her discretion. She tells me she intends to name her son Cynthia [name] as Economist.   HEALTH MAINTENANCE: Social History   Tobacco Use   Smoking status: Never   Smokeless tobacco: Never  Vaping Use   Vaping Use: Never used  Substance Use Topics   Alcohol use: Yes    Alcohol/week: 1.0 standard drink    Types: 1 Glasses of wine per week    Comment: 1-2 wine in evening   Drug use: Not Currently     Colonoscopy: yes, most recent on file from 12/04/13  PAP: 12-04-2013?  Bone density: 08/2020, -1.6   Allergies  Allergen Reactions   Other Anaphylaxis    FIRE  ANTS Insect stings / bites   Acyclovir And Related     Unknown reaction   Codeine     Syncope    Penicillins Rash    Has patient had a PCN reaction causing immediate rash, facial/tongue/throat swelling, SOB or lightheadedness with hypotension: Unknown Has patient had a PCN reaction causing severe rash involving mucus membranes or skin necrosis: No Has patient had a PCN reaction that required hospitalization No Has patient had a PCN reaction occurring within the last 10 years: No If all of the above answers are "NO", then may proceed with Cephalosporin use.     Current Outpatient Medications  Medication Sig Dispense Refill   acetaminophen (TYLENOL) 500 MG tablet Take 1,000 mg by mouth every 6 (six) hours as needed for moderate pain or headache.     alendronate (FOSAMAX) 70 MG tablet TAKE 1 TABLET BY MOUTH EVERY 7 DAYS. TAKE WITH FULL GLASS OF WATER ON AN EMPTY STOMACH (Patient taking differently: Take 70 mg by mouth every Sunday.) 12 tablet 3   ALPRAZolam (XANAX) 0.5 MG tablet TAKE 1/2 TABLET BY MOUTH 3 TIMES A DAY AS NEEDED FOR ANXIETY 45 tablet 0  Calcium Carb-Cholecalciferol (CALCIUM CARBONATE-VITAMIN D3) 600-400 MG-UNIT TABS Take 1 tablet by mouth 2 (two) times daily.     Cetirizine HCl 10 MG TBDP Take 1 tablet by mouth at bedtime. Aller-Tec (from [employer])     EPIPEN 2-PAK 0.3 MG/0.3ML SOAJ injection USE AS DIRECTED 2 each 1   famotidine (PEPCID) 20 MG tablet TAKE 1 TABLET BY MOUTH TWICE A DAY 180 tablet 2   ferrous sulfate 325 (65 FE) MG tablet Take 325 mg by mouth daily.     levETIRAcetam (KEPPRA) 250 MG tablet Take 1 tablet (250 mg total) by mouth 1 day or 1 dose for 1 dose. 30 tablet 2   losartan (COZAAR) 100 MG tablet TAKE 1 TABLET BY MOUTH EVERY DAY (Patient taking differently: Take 100 mg by mouth daily.) 90 tablet 3   meloxicam (MOBIC) 15 MG tablet Take 1 tablet (15 mg total) by mouth daily. 30 tablet 0   traMADol (ULTRAM) 50 MG tablet Take 1 tablet (50 mg total) by mouth  every 6 (six) hours as needed for moderate pain or severe pain. 15 tablet 1   vitamin B-12 (CYANOCOBALAMIN) 1000 MCG tablet Take 1,000 mcg by mouth daily.     zinc gluconate 50 MG tablet Take 50 mg by mouth daily.     No current facility-administered medications for this visit.    OBJECTIVE: White woman in no acute distress  Vitals:   03/01/21 1422  BP: (!) 150/67  Pulse: (!) 104  Resp: 19  Temp: (!) 97.5 F (36.4 C)  SpO2: 98%      Body mass index is 24.44 kg/m.   Wt Readings from Last 3 Encounters:  03/01/21 151 lb 6.4 oz (68.7 kg)  02/22/21 152 lb (68.9 kg)  09/29/20 152 lb 12.8 oz (69.3 kg)      ECOG FS:1 - Symptomatic but completely ambulatory  Sclerae unicteric, EOMs intact Wearing a mask No cervical or supraclavicular adenopathy Lungs no rales or rhonchi Heart regular rate and rhythm Abd soft, nontender, positive bowel sounds MSK no focal spinal tenderness, no upper extremity lymphedema Neuro: nonfocal, well oriented, appropriate affect Breasts: The right breast is benign.  The left breast is status postlumpectomy and radiation.  The cosmetic result is excellent.  There is no evidence of local recurrence.  Both axillae are benign   LAB RESULTS:  CMP     Component Value Date/Time   NA 141 08/25/2020 1200   K 4.0 08/25/2020 1200   CL 104 08/25/2020 1200   CO2 28 08/25/2020 1200   GLUCOSE 93 08/25/2020 1200   BUN 16 08/25/2020 1200   CREATININE 0.75 08/25/2020 1200   CREATININE 0.74 07/29/2020 1234   CREATININE 0.83 05/25/2020 1111   CALCIUM 9.5 08/25/2020 1200   PROT 6.5 07/29/2020 1234   ALBUMIN 3.6 07/29/2020 1234   AST 20 07/29/2020 1234   ALT 14 07/29/2020 1234   ALKPHOS 60 07/29/2020 1234   BILITOT 0.5 07/29/2020 1234   GFRNONAA >60 08/25/2020 1200   GFRNONAA >60 07/29/2020 1234   GFRNONAA 70 05/25/2020 1111   GFRAA 82 05/25/2020 1111    No results found for: TOTALPROTELP, ALBUMINELP, A1GS, A2GS, BETS, BETA2SER, GAMS, MSPIKE, SPEI  Lab  Results  Component Value Date   WBC 6.8 08/25/2020   NEUTROABS 3.5 07/29/2020   HGB 12.4 08/25/2020   HCT 36.2 08/25/2020   MCV 91.4 08/25/2020   PLT 293 08/25/2020    No results found for: LABCA2  No components found for: MVHQIO962  No  results for input(s): INR in the last 168 hours.  No results found for: LABCA2  No results found for: IDP824  No results found for: MPN361  No results found for: WER154  No results found for: CA2729  No components found for: HGQUANT  No results found for: CEA1 / No results found for: CEA1   No results found for: AFPTUMOR  No results found for: CHROMOGRNA  No results found for: KPAFRELGTCHN, LAMBDASER, KAPLAMBRATIO (kappa/lambda light chains)  No results found for: HGBA, HGBA2QUANT, HGBFQUANT, HGBSQUAN (Hemoglobinopathy evaluation)   No results found for: LDH  No results found for: IRON, TIBC, IRONPCTSAT (Iron and TIBC)  No results found for: FERRITIN  Urinalysis    Component Value Date/Time   COLORURINE DARK YELLOW 01/24/2020 1312   APPEARANCEUR CLEAR 01/24/2020 1312   LABSPEC 1.015 01/24/2020 1312   PHURINE 5.5 01/24/2020 1312   GLUCOSEU NEGATIVE 01/24/2020 1312   HGBUR TRACE (A) 01/24/2020 [place] 05/11/2017 [place] 01/24/2020 1312   PROTEINUR NEGATIVE 01/24/2020 1312   UROBILINOGEN 0.2 01/08/2013 1533   NITRITE NEGATIVE 01/24/2020 1312   LEUKOCYTESUR TRACE (A) 01/24/2020 1312    STUDIES: No results found.   ELIGIBLE FOR AVAILABLE RESEARCH PROTOCOL: no  ASSESSMENT: 75 y.o. [place] woman s/p Left breast biopsy 07/22/2020 for ductal carcinoma in situ, grade 3, functionally estrogen and progesterone receptor negative  (1) s/p left lumpectomy 08/28/2020 for a residual 0.25 cm area of ductal carcinoma in situ, with ample margins  (2) adjuvant radiation  10/08/2020 through 11/05/2020 Site Technique Total Dose (Gy) Dose per Fx (Gy) Completed Fx Beam Energies  Breast, Left:  Breast_Lt 3D 40.05/40.05 2.67 15/15 6X  Breast, Left: Breast_Lt_Bst specialPort 10/10 2 5/5 12E, 15E    (3) do not recommend anti-estrogens given minimal estrogen receptor positivity   PLAN: Denee is now 6 months out from definitive surgery for her breast cancer with no evidence of disease recurrence.  She tolerated her surgery and radiation well.  All she requires now is observation  Since we are not doing any systemic therapy I am comfortable releasing her to her other physicians care.  All she will need is her yearly mammography which she receives towards the end of the year at [facility], and a yearly physician breast exam.  I will be glad to see Fabio Bering at anytime in the future if and when the need arises but as of now are making no further routine appointments for her here  Total encounter time 20 minutes.Sarajane Jews C. Manolito Jurewicz, MD 03/01/2021 2:42 PM Medical Oncology and Hematology [facility] [place], [place] [address] Tel. [phone]    Fax. [phone]   This document serves as a record of services personally performed by Lurline Del, MD. It was created on his behalf by Wilburn Mylar, a trained medical scribe. The creation of this record is based on the scribe's personal observations and the provider's statements to them.   I, Lurline Del MD, have reviewed the above documentation for accuracy and completeness, and I agree with the above.   *Total Encounter Time as defined by the Centers for Medicare and Medicaid Services includes, in addition to the face-to-face time of a patient visit (documented in the note above) non-face-to-face time: obtaining and reviewing outside history, ordering and reviewing medications, tests or procedures, care coordination (communications with other health care professionals or caregivers) and documentation in the medical record.

## 2021-03-01 ENCOUNTER — Inpatient Hospital Stay: Payer: Medicare PPO | Attending: Oncology | Admitting: Oncology

## 2021-03-01 ENCOUNTER — Other Ambulatory Visit: Payer: Self-pay

## 2021-03-01 ENCOUNTER — Telehealth: Payer: Self-pay | Admitting: Family Medicine

## 2021-03-01 VITALS — BP 150/67 | HR 104 | Temp 97.5°F | Resp 19 | Wt 151.4 lb

## 2021-03-01 DIAGNOSIS — Z880 Allergy status to penicillin: Secondary | ICD-10-CM | POA: Diagnosis not present

## 2021-03-01 DIAGNOSIS — Z6372 Alcoholism and drug addiction in family: Secondary | ICD-10-CM | POA: Diagnosis not present

## 2021-03-01 DIAGNOSIS — Z8249 Family history of ischemic heart disease and other diseases of the circulatory system: Secondary | ICD-10-CM | POA: Diagnosis not present

## 2021-03-01 DIAGNOSIS — D0512 Intraductal carcinoma in situ of left breast: Secondary | ICD-10-CM

## 2021-03-01 DIAGNOSIS — Z801 Family history of malignant neoplasm of trachea, bronchus and lung: Secondary | ICD-10-CM | POA: Diagnosis not present

## 2021-03-01 DIAGNOSIS — Z885 Allergy status to narcotic agent status: Secondary | ICD-10-CM | POA: Diagnosis not present

## 2021-03-01 DIAGNOSIS — R531 Weakness: Secondary | ICD-10-CM | POA: Insufficient documentation

## 2021-03-01 DIAGNOSIS — Z823 Family history of stroke: Secondary | ICD-10-CM | POA: Diagnosis not present

## 2021-03-01 DIAGNOSIS — Z8673 Personal history of transient ischemic attack (TIA), and cerebral infarction without residual deficits: Secondary | ICD-10-CM | POA: Insufficient documentation

## 2021-03-01 DIAGNOSIS — Z800 Family history of malignant neoplasm of digestive organs: Secondary | ICD-10-CM | POA: Diagnosis not present

## 2021-03-01 DIAGNOSIS — Z8719 Personal history of other diseases of the digestive system: Secondary | ICD-10-CM | POA: Diagnosis not present

## 2021-03-01 DIAGNOSIS — Z79899 Other long term (current) drug therapy: Secondary | ICD-10-CM | POA: Insufficient documentation

## 2021-03-01 NOTE — Telephone Encounter (Signed)
Left message for patient to call back and schedule Medicare Annual Wellness Visit (AWV) in office.   If not able to come in office, please offer to do virtually or by telephone.   Last AWV:  Please schedule at  06/29/2015  If any questions, please contact me at [phone]

## 2021-03-05 ENCOUNTER — Other Ambulatory Visit: Payer: Self-pay

## 2021-03-05 ENCOUNTER — Other Ambulatory Visit: Payer: Self-pay | Admitting: Family Medicine

## 2021-03-05 ENCOUNTER — Ambulatory Visit (INDEPENDENT_AMBULATORY_CARE_PROVIDER_SITE_OTHER): Payer: Medicare PPO

## 2021-03-05 DIAGNOSIS — Z0000 Encounter for general adult medical examination without abnormal findings: Secondary | ICD-10-CM | POA: Diagnosis not present

## 2021-03-05 NOTE — Progress Notes (Signed)
Subjective:   Cynthia [name] is a 75 y.o. female who presents for Medicare Annual (Subsequent) preventive examination.  I connected with Cynthia [name]  today by telephone and verified that I am speaking with the correct person using two identifiers. Location patient: home Location provider: work Persons participating in the virtual visit: patient, provider.   I discussed the limitations, risks, security and privacy concerns of performing an evaluation and management service by telephone and the availability of in person appointments. I also discussed with the patient that there may be a patient responsible charge related to this service. The patient expressed understanding and verbally consented to this telephonic visit.    Interactive audio and video telecommunications were attempted between this provider and patient, however failed, due to patient having technical difficulties OR patient did not have access to video capability.  We continued and completed visit with audio only.     Review of Systems    N/A Cardiac Risk Factors include: advanced age (>93men, >74 women);hypertension     Objective:    Today's Vitals   03/05/21 1029  PainSc: 6    There is no height or weight on file to calculate BMI.  Advanced Directives 03/05/2021 09/29/2020 08/25/2020 06/28/2016 06/16/2016 05/27/2016 05/18/2016  Does Patient Have a Medical Advance Directive? Yes No No No No No No  Type of Paramedic of [place];Living will - - - - - -  Does patient want to make changes to medical advance directive? No - Patient declined - - - - - -  Copy of [place] in Chart? Yes - validated most recent copy scanned in chart (See row information) - - - - - -  Would patient like information on creating a medical advance directive? - No - Patient declined - Yes - Educational materials given No - patient declined information Yes - Educational materials given -     Current Medications (verified) Outpatient Encounter Medications as of 03/05/2021  Medication Sig   acetaminophen (TYLENOL) 500 MG tablet Take 1,000 mg by mouth every 6 (six) hours as needed for moderate pain or headache.   alendronate (FOSAMAX) 70 MG tablet TAKE 1 TABLET BY MOUTH EVERY 7 DAYS. TAKE WITH FULL GLASS OF WATER ON AN EMPTY STOMACH (Patient taking differently: Take 70 mg by mouth every Sunday.)   ALPRAZolam (XANAX) 0.5 MG tablet TAKE 1/2 TABLET BY MOUTH 3 TIMES A DAY AS NEEDED FOR ANXIETY   Calcium Carb-Cholecalciferol (CALCIUM CARBONATE-VITAMIN D3) 600-400 MG-UNIT TABS Take 1 tablet by mouth 2 (two) times daily.   Cetirizine HCl 10 MG TBDP Take 1 tablet by mouth at bedtime. Aller-Tec (from [employer])   EPIPEN 2-PAK 0.3 MG/0.3ML SOAJ injection USE AS DIRECTED   famotidine (PEPCID) 20 MG tablet TAKE 1 TABLET BY MOUTH TWICE A DAY   ferrous sulfate 325 (65 FE) MG tablet Take 325 mg by mouth daily.   levETIRAcetam (KEPPRA) 250 MG tablet Being tapered to off under neurologist direction   losartan (COZAAR) 100 MG tablet TAKE 1 TABLET BY MOUTH EVERY DAY (Patient taking differently: Take 100 mg by mouth daily.)   vitamin B-12 (CYANOCOBALAMIN) 1000 MCG tablet Take 1,000 mcg by mouth daily.   zinc gluconate 50 MG tablet Take 50 mg by mouth daily.   traMADol (ULTRAM) 50 MG tablet Take 1 tablet (50 mg total) by mouth every 6 (six) hours as needed for moderate pain or severe pain. (Patient not taking: Reported on 03/05/2021)   [DISCONTINUED] meloxicam (MOBIC) 15  MG tablet Take 1 tablet (15 mg total) by mouth daily.   No facility-administered encounter medications on file as of 03/05/2021.    Allergies (verified) Other, Acyclovir and related, Codeine, and Penicillins   History: Past Medical History:  Diagnosis Date   Breast cancer ([place])    Colon polyps    Depression    history of   Diverticulitis    GERD (gastroesophageal reflux disease)    Hypertension    Osteoporosis    Seizures  ([place])    Stroke [facility])    Past Surgical History:  Procedure Laterality Date   BREAST LUMPECTOMY WITH RADIOACTIVE SEED LOCALIZATION Left 08/28/2020   Procedure: LEFT BREAST LUMPECTOMY WITH RADIOACTIVE SEED LOCALIZATION;  Surgeon: Stark Klein, MD;  Location: [facility];  Service: General;  Laterality: Left;  RNFA   IR GENERIC HISTORICAL  08/05/2016   IR ANGIO VERTEBRAL SEL VERTEBRAL UNI R MOD SED 08/05/2016 Luanne Bras, MD [facility]   IR GENERIC HISTORICAL  08/05/2016   IR ANGIO INTRA EXTRACRAN SEL COM CAROTID INNOMINATE BILAT MOD SED 08/05/2016 Luanne Bras, MD [facility]   IR GENERIC HISTORICAL  08/05/2016   IR ANGIO VERTEBRAL SEL SUBCLAVIAN INNOMINATE UNI L MOD SED 08/05/2016 Luanne Bras, MD [facility]   MOUTH SURGERY     TONSILLECTOMY     TUBAL LIGATION     Family History  Problem Relation Age of Onset   Heart disease Mother    Cancer Mother        lung   Stroke Paternal Grandmother    Stomach cancer Maternal Grandfather    Social History   Socioeconomic History   Marital status: Married    Spouse name: Not on file   Number of children: 3   Years of education: Not on file   Highest education level: Not on file  Occupational History    Comment: Pharmacist, hospital, retired  Tobacco Use   Smoking status: Never   Smokeless tobacco: Never  Vaping Use   Vaping Use: Never used  Substance and Sexual Activity   Alcohol use: Yes    Alcohol/week: 1.0 standard drink    Types: 1 Glasses of wine per week    Comment: 1-2 wine in evening   Drug use: Not Currently   Sexual activity: Not Currently    Comment: Married since 2005  Other Topics Concern   Not on file  Social History Narrative   Lives with husband and son   Right Handed   Drinks 1-2 cups daily.    Social Determinants of Health   Financial Resource Strain: Low Risk    Difficulty of Paying Living Expenses: Not very hard  Food Insecurity: No Food Insecurity   Worried About Charity fundraiser in the  Last Year: Never true   Ran Out of Food in the Last Year: Never true  Transportation Needs: No Transportation Needs   Lack of Transportation (Medical): No   Lack of Transportation (Non-Medical): No  Physical Activity: Sufficiently Active   Days of Exercise per Week: 7 days   Minutes of Exercise per Session: 30 min  Stress: No Stress Concern Present   Feeling of Stress : Not at all  Social Connections: Moderately Isolated   Frequency of Communication with Friends and Family: More than three times a week   Frequency of Social Gatherings with Friends and Family: More than three times a week   Attends Religious Services: Never   Marine scientist or Organizations: No   Attends [employer]  or Organization Meetings: Never   Marital Status: Married    Tobacco Counseling Counseling given: Not Answered   Clinical Intake:  Pre-visit preparation completed: Yes  Pain : 0-10 Pain Score: 6  Pain Type: Chronic pain Pain Location: Back (arms) Pain Orientation: Lower Pain Onset: More than a month ago Pain Frequency: Intermittent Pain Relieving Factors: Tylenol  Pain Relieving Factors: Tylenol  Nutritional Risks: None Diabetes: No  How often do you need to have someone help you when you read instructions, pamphlets, or other written materials from your doctor or pharmacy?: 1 - Never  Diabetic?no  Interpreter Needed?: No  Information entered by :: [place] of Daily Living In your present state of health, do you have any difficulty performing the following activities: 03/05/2021 08/25/2020  Hearing? N N  Vision? N -  Difficulty concentrating or making decisions? N -  Walking or climbing stairs? N N  Dressing or bathing? N N  Doing errands, shopping? N N  Preparing Food and eating ? N -  Using the Toilet? N -  In the past six months, have you accidently leaked urine? Y -  Do you have problems with loss of bowel control? N -  Managing your Medications? N -   Managing your Finances? N -  Housekeeping or managing your Housekeeping? N -  Some recent data might be hidden    Patient Care Team: Susy Frizzle, MD as PCP - General (Family Medicine) Mauro Kaufmann, RN as Oncology Nurse Navigator Rockwell Germany, RN as Oncology Nurse Navigator Stark Klein, MD as Consulting Physician (General Surgery) Magrinat, Virgie Dad, MD as Consulting Physician (Oncology) Eppie Gibson, MD as Attending Physician (Radiation Oncology) Juluis Rainier as Consulting Physician (Optometry) Garvin Fila, MD as Consulting Physician (Neurology) Carmela Rima, DMD as Consulting Physician (Dentistry)  Indicate any recent Medical Services you may have received from other than [facility] providers in the past year (date may be approximate).     Assessment:   This is a routine wellness examination for Cynthia [name].  Hearing/Vision screen Vision Screening - Comments:: Patient states she gets eyes exams twice per year. Currently wears glasses   Dietary issues and exercise activities discussed: Current Exercise Habits: Home exercise routine, Type of exercise: walking, Time (Minutes): 30, Frequency (Times/Week): 7, Weekly Exercise (Minutes/Week): 210, Intensity: Moderate, Exercise limited by: None identified   Goals Addressed             This Visit's Progress    Patient Stated       I am working on study dog training and readings on the brain         Depression Screen PHQ 2/9 Scores 03/05/2021 03/29/2019 06/30/2017 05/11/2017 02/21/2017 05/17/2016  PHQ - 2 Score 0 0 0 0 0 0  PHQ- 9 Score - - 4 0 0 -    Fall Risk Fall Risk  03/05/2021 03/29/2019 06/08/2017 02/21/2017 06/28/2016  Falls in the past year? 1 0 Yes No No  Number falls in past yr: 0 - 1 - -  Injury with Fall? 0 - No - -  Risk for fall due to : No Fall Risks - Other (Comment) - -  Risk for fall due to: Comment - - tripped over vacuum cord - -  Follow up Falls evaluation completed;Falls prevention discussed  Falls evaluation completed - - -    FALL RISK PREVENTION PERTAINING TO THE HOME:  Any stairs in or around the home? Yes  If so,  are there any without handrails? No  Home free of loose throw rugs in walkways, pet beds, electrical cords, etc? Yes  Adequate lighting in your home to reduce risk of falls? Yes   ASSISTIVE DEVICES UTILIZED TO PREVENT FALLS:  Life alert? No  Use of a cane, walker or w/c? No  Grab bars in the bathroom? No  Shower chair or bench in shower? No  Elevated toilet seat or a handicapped toilet? No     Cognitive Function:  Normal cognitive status assessed by direct observation by this Nurse Health Advisor. No abnormalities found.        Immunizations Immunization History  Administered Date(s) Administered   Fluad Quad(high Dose 65+) 05/27/2019   Influenza Split 06/10/2013   Influenza, High Dose Seasonal PF 04/28/2017, 06/02/2018, 05/09/2020   Influenza,inj,Quad PF,6+ Mos 06/17/2014   Moderna SARS-COV2 Booster Vaccination 07/31/2020   Moderna Sars-Covid-2 Vaccination 10/21/2019, 11/19/2019   Pneumococcal Conjugate-13 06/17/2014   Pneumococcal Polysaccharide-23 06/18/2013   Tdap 03/31/2011   Zoster, Live 03/02/2010    TDAP status: Up to date  Flu Vaccine status: Up to date  Pneumococcal vaccine status: Up to date  Covid-19 vaccine status: Completed vaccines  Qualifies for Shingles Vaccine? Yes   Zostavax completed Yes   Shingrix Completed?: No.    Education has been provided regarding the importance of this vaccine. Patient has been advised to call insurance company to determine out of pocket expense if they have not yet received this vaccine. Advised may also receive vaccine at local pharmacy or Health Dept. Verbalized acceptance and understanding.  Screening Tests Health Maintenance  Topic Date Due   Hepatitis C Screening  Never done   Zoster Vaccines- Shingrix (1 of 2) Never done   COVID-19 Vaccine (4 - Booster for Moderna series) 10/31/2020    TETANUS/TDAP  03/30/2021   INFLUENZA VACCINE  04/12/2021   MAMMOGRAM  07/22/2022   COLONOSCOPY (Pts 45-34yrs Insurance coverage will need to be confirmed)  05/14/2024   DEXA SCAN  Completed   PNA vac Low Risk Adult  Completed   HPV VACCINES  Aged Out    Health Maintenance  Health Maintenance Due  Topic Date Due   Hepatitis C Screening  Never done   Zoster Vaccines- Shingrix (1 of 2) Never done   COVID-19 Vaccine (4 - Booster for Moderna series) 10/31/2020    Colorectal cancer screening: Type of screening: Colonoscopy. Completed 05/14/2014. Repeat every 10 years  Mammogram status: Completed 07/22/2020. Repeat every year  Bone Density status: Completed 08/12/2020. Results reflect: Bone density results: OSTEOPENIA. Repeat every 2 years.  Lung Cancer Screening: (Low Dose CT Chest recommended if Age 13-80 years, 30 pack-year currently smoking OR have quit w/in 15years.) does not qualify.   Lung Cancer Screening Referral: N/A  Additional Screening:  Hepatitis C Screening: does qualify;   Vision Screening: Recommended annual ophthalmology exams for early detection of glaucoma and other disorders of the eye. Is the patient up to date with their annual eye exam?  Yes  Who is the provider or what is the name of the office in which the patient attends annual eye exams? Dr. Idolina Primer If pt is not established with a provider, would they like to be referred to a provider to establish care? No .   Dental Screening: Recommended annual dental exams for proper oral hygiene  Community Resource Referral / Chronic Care Management: CRR required this visit?  No   CCM required this visit?  No      Plan:  I have personally reviewed and noted the following in the patient's chart:   Medical and social history Use of alcohol, tobacco or illicit drugs  Current medications and supplements including opioid prescriptions.  Functional ability and status Nutritional status Physical  activity Advanced directives List of other physicians Hospitalizations, surgeries, and ER visits in previous 12 months Vitals Screenings to include cognitive, depression, and falls Referrals and appointments  In addition, I have reviewed and discussed with patient certain preventive protocols, quality metrics, and best practice recommendations. A written personalized care plan for preventive services as well as general preventive health recommendations were provided to patient.     Ofilia Neas, LPN   [id number]/76/7011   Nurse Notes: None

## 2021-03-05 NOTE — Patient Instructions (Signed)
Cynthia [name] , Thank you for taking time to come for your Medicare Wellness Visit. I appreciate your ongoing commitment to your health goals. Please review the following plan we discussed and let me know if I can assist you in the future.   Screening recommendations/referrals: Colonoscopy: Up to date, next due 05/14/2024 Mammogram: Up to date, next due 07/22/2021 Bone Density: Up to date, next due 08/12/2022 Recommended yearly ophthalmology/optometry visit for glaucoma screening and checkup Recommended yearly dental visit for hygiene and checkup  Vaccinations: Influenza vaccine: Up to date, next due fall 2022  Pneumococcal vaccine: Completed series  Tdap vaccine: Up to date, next due 03/30/2021 Shingles vaccine: Currently due for Shingirx, if you would like to receive we recommend that you do so at your local pharmacy    Advanced directives: Copies on file   Conditions/risks identified: None   Next appointment: None    Preventive Care 65 Years and Older, Female Preventive care refers to lifestyle choices and visits with your health care provider that can promote health and wellness. What does preventive care include? A yearly physical exam. This is also called an annual well check. Dental exams once or twice a year. Routine eye exams. Ask your health care provider how often you should have your eyes checked. Personal lifestyle choices, including: Daily care of your teeth and gums. Regular physical activity. Eating a healthy diet. Avoiding tobacco and drug use. Limiting alcohol use. Practicing safe sex. Taking low-dose aspirin every day. Taking vitamin and mineral supplements as recommended by your health care provider. What happens during an annual well check? The services and screenings done by your health care provider during your annual well check will depend on your age, overall health, lifestyle risk factors, and family history of disease. Counseling  Your health care  provider may ask you questions about your: Alcohol use. Tobacco use. Drug use. Emotional well-being. Home and relationship well-being. Sexual activity. Eating habits. History of falls. Memory and ability to understand (cognition). Work and work Statistician. Reproductive health. Screening  You may have the following tests or measurements: Height, weight, and BMI. Blood pressure. Lipid and cholesterol levels. These may be checked every 5 years, or more frequently if you are over 34 years old. Skin check. Lung cancer screening. You may have this screening every year starting at age 77 if you have a 30-pack-year history of smoking and currently smoke or have quit within the past 15 years. Fecal occult blood test (FOBT) of the stool. You may have this test every year starting at age 49. Flexible sigmoidoscopy or colonoscopy. You may have a sigmoidoscopy every 5 years or a colonoscopy every 10 years starting at age 37. Hepatitis C blood test. Hepatitis B blood test. Sexually transmitted disease (STD) testing. Diabetes screening. This is done by checking your blood sugar (glucose) after you have not eaten for a while (fasting). You may have this done every 1-3 years. Bone density scan. This is done to screen for osteoporosis. You may have this done starting at age 42. Mammogram. This may be done every 1-2 years. Talk to your health care provider about how often you should have regular mammograms. Talk with your health care provider about your test results, treatment options, and if necessary, the need for more tests. Vaccines  Your health care provider may recommend certain vaccines, such as: Influenza vaccine. This is recommended every year. Tetanus, diphtheria, and acellular pertussis (Tdap, Td) vaccine. You may need a Td booster every 10 years. Zoster vaccine.  You may need this after age 71. Pneumococcal 13-valent conjugate (PCV13) vaccine. One dose is recommended after age  30. Pneumococcal polysaccharide (PPSV23) vaccine. One dose is recommended after age 44. Talk to your health care provider about which screenings and vaccines you need and how often you need them. This information is not intended to replace advice given to you by your health care provider. Make sure you discuss any questions you have with your health care provider. Document Released: 09/25/2015 Document Revised: 05/18/2016 Document Reviewed: 06/30/2015 Elsevier Interactive Patient Education  2017 [place] Prevention in the Home Falls can cause injuries. They can happen to people of all ages. There are many things you can do to make your home safe and to help prevent falls. What can I do on the outside of my home? Regularly fix the edges of walkways and driveways and fix any cracks. Remove anything that might make you trip as you walk through a door, such as a raised step or threshold. Trim any bushes or trees on the path to your home. Use bright outdoor lighting. Clear any walking paths of anything that might make someone trip, such as rocks or tools. Regularly check to see if handrails are loose or broken. Make sure that both sides of any steps have handrails. Any raised decks and porches should have guardrails on the edges. Have any leaves, snow, or ice cleared regularly. Use sand or salt on walking paths during winter. Clean up any spills in your garage right away. This includes oil or grease spills. What can I do in the bathroom? Use night lights. Install grab bars by the toilet and in the tub and shower. Do not use towel bars as grab bars. Use non-skid mats or decals in the tub or shower. If you need to sit down in the shower, use a plastic, non-slip stool. Keep the floor dry. Clean up any water that spills on the floor as soon as it happens. Remove soap buildup in the tub or shower regularly. Attach bath mats securely with double-sided non-slip rug tape. Do not have throw  rugs and other things on the floor that can make you trip. What can I do in the bedroom? Use night lights. Make sure that you have a light by your bed that is easy to reach. Do not use any sheets or blankets that are too big for your bed. They should not hang down onto the floor. Have a firm chair that has side arms. You can use this for support while you get dressed. Do not have throw rugs and other things on the floor that can make you trip. What can I do in the kitchen? Clean up any spills right away. Avoid walking on wet floors. Keep items that you use a lot in easy-to-reach places. If you need to reach something above you, use a strong step stool that has a grab bar. Keep electrical cords out of the way. Do not use floor polish or wax that makes floors slippery. If you must use wax, use non-skid floor wax. Do not have throw rugs and other things on the floor that can make you trip. What can I do with my stairs? Do not leave any items on the stairs. Make sure that there are handrails on both sides of the stairs and use them. Fix handrails that are broken or loose. Make sure that handrails are as long as the stairways. Check any carpeting to make sure that it is  firmly attached to the stairs. Fix any carpet that is loose or worn. Avoid having throw rugs at the top or bottom of the stairs. If you do have throw rugs, attach them to the floor with carpet tape. Make sure that you have a light switch at the top of the stairs and the bottom of the stairs. If you do not have them, ask someone to add them for you. What else can I do to help prevent falls? Wear shoes that: Do not have high heels. Have rubber bottoms. Are comfortable and fit you well. Are closed at the toe. Do not wear sandals. If you use a stepladder: Make sure that it is fully opened. Do not climb a closed stepladder. Make sure that both sides of the stepladder are locked into place. Ask someone to hold it for you, if  possible. Clearly mark and make sure that you can see: Any grab bars or handrails. First and last steps. Where the edge of each step is. Use tools that help you move around (mobility aids) if they are needed. These include: Canes. Walkers. Scooters. Crutches. Turn on the lights when you go into a dark area. Replace any light bulbs as soon as they burn out. Set up your furniture so you have a clear path. Avoid moving your furniture around. If any of your floors are uneven, fix them. If there are any pets around you, be aware of where they are. Review your medicines with your doctor. Some medicines can make you feel dizzy. This can increase your chance of falling. Ask your doctor what other things that you can do to help prevent falls. This information is not intended to replace advice given to you by your health care provider. Make sure you discuss any questions you have with your health care provider. Document Released: 06/25/2009 Document Revised: 02/04/2016 Document Reviewed: 10/03/2014 Elsevier Interactive Patient Education  2017 [employer].

## 2021-03-09 ENCOUNTER — Other Ambulatory Visit: Payer: Self-pay

## 2021-03-09 ENCOUNTER — Encounter (HOSPITAL_BASED_OUTPATIENT_CLINIC_OR_DEPARTMENT_OTHER): Payer: Self-pay

## 2021-03-09 ENCOUNTER — Emergency Department (HOSPITAL_BASED_OUTPATIENT_CLINIC_OR_DEPARTMENT_OTHER): Payer: Medicare PPO | Admitting: Radiology

## 2021-03-09 ENCOUNTER — Emergency Department (HOSPITAL_BASED_OUTPATIENT_CLINIC_OR_DEPARTMENT_OTHER)
Admission: EM | Admit: 2021-03-09 | Discharge: 2021-03-10 | Disposition: A | Discharge status: Home / Self Care | Payer: Medicare PPO | Attending: Emergency Medicine | Admitting: Emergency Medicine

## 2021-03-09 ENCOUNTER — Emergency Department (HOSPITAL_BASED_OUTPATIENT_CLINIC_OR_DEPARTMENT_OTHER): Payer: Medicare PPO

## 2021-03-09 DIAGNOSIS — Z79899 Other long term (current) drug therapy: Secondary | ICD-10-CM | POA: Diagnosis not present

## 2021-03-09 DIAGNOSIS — R52 Pain, unspecified: Secondary | ICD-10-CM

## 2021-03-09 DIAGNOSIS — G9389 Other specified disorders of brain: Secondary | ICD-10-CM | POA: Diagnosis not present

## 2021-03-09 DIAGNOSIS — M791 Myalgia, unspecified site: Secondary | ICD-10-CM | POA: Diagnosis not present

## 2021-03-09 DIAGNOSIS — I10 Essential (primary) hypertension: Secondary | ICD-10-CM | POA: Diagnosis not present

## 2021-03-09 DIAGNOSIS — R410 Disorientation, unspecified: Secondary | ICD-10-CM | POA: Diagnosis not present

## 2021-03-09 DIAGNOSIS — Z853 Personal history of malignant neoplasm of breast: Secondary | ICD-10-CM | POA: Insufficient documentation

## 2021-03-09 DIAGNOSIS — R5383 Other fatigue: Secondary | ICD-10-CM | POA: Diagnosis not present

## 2021-03-09 DIAGNOSIS — G319 Degenerative disease of nervous system, unspecified: Secondary | ICD-10-CM | POA: Diagnosis not present

## 2021-03-09 DIAGNOSIS — R4182 Altered mental status, unspecified: Secondary | ICD-10-CM | POA: Insufficient documentation

## 2021-03-09 DIAGNOSIS — Z20822 Contact with and (suspected) exposure to covid-19: Secondary | ICD-10-CM | POA: Diagnosis not present

## 2021-03-09 DIAGNOSIS — R413 Other amnesia: Secondary | ICD-10-CM | POA: Diagnosis not present

## 2021-03-09 NOTE — ED Triage Notes (Signed)
Patient here POV from Home with  Patient states she was ill this past weekend (lethargic, febrile) and patients children were concerned that her text messages were "confusing"  Patient states she feels better but just feels "drained".  Ambulatory, GCS 15, A&Ox4.

## 2021-03-10 ENCOUNTER — Encounter (HOSPITAL_BASED_OUTPATIENT_CLINIC_OR_DEPARTMENT_OTHER): Payer: Self-pay | Admitting: Emergency Medicine

## 2021-03-10 ENCOUNTER — Emergency Department (HOSPITAL_BASED_OUTPATIENT_CLINIC_OR_DEPARTMENT_OTHER): Payer: Medicare PPO

## 2021-03-10 ENCOUNTER — Other Ambulatory Visit: Payer: Self-pay

## 2021-03-10 DIAGNOSIS — R4182 Altered mental status, unspecified: Secondary | ICD-10-CM | POA: Diagnosis not present

## 2021-03-10 DIAGNOSIS — I700 Atherosclerosis of aorta: Secondary | ICD-10-CM | POA: Diagnosis not present

## 2021-03-10 DIAGNOSIS — E041 Nontoxic single thyroid nodule: Secondary | ICD-10-CM | POA: Diagnosis not present

## 2021-03-10 DIAGNOSIS — G319 Degenerative disease of nervous system, unspecified: Secondary | ICD-10-CM | POA: Diagnosis not present

## 2021-03-10 DIAGNOSIS — G9389 Other specified disorders of brain: Secondary | ICD-10-CM | POA: Diagnosis not present

## 2021-03-10 LAB — CBC WITH DIFFERENTIAL/PLATELET
Abs Immature Granulocytes: 0.02 10*3/uL (ref 0.00–0.07)
Basophils Absolute: 0 10*3/uL (ref 0.0–0.1)
Basophils Relative: 0 %
Eosinophils Absolute: 0 10*3/uL (ref 0.0–0.5)
Eosinophils Relative: 0 %
HCT: 35 % — ABNORMAL LOW (ref 36.0–46.0)
Hemoglobin: 11.9 g/dL — ABNORMAL LOW (ref 12.0–15.0)
Immature Granulocytes: 1 %
Lymphocytes Relative: 26 %
Lymphs Abs: 0.9 10*3/uL (ref 0.7–4.0)
MCH: 30.4 pg (ref 26.0–34.0)
MCHC: 34 g/dL (ref 30.0–36.0)
MCV: 89.5 fL (ref 80.0–100.0)
Monocytes Absolute: 0.2 10*3/uL (ref 0.1–1.0)
Monocytes Relative: 6 %
Neutro Abs: 2.2 10*3/uL (ref 1.7–7.7)
Neutrophils Relative %: 67 %
Platelets: 169 10*3/uL (ref 150–400)
RBC: 3.91 MIL/uL (ref 3.87–5.11)
RDW: 12.5 % (ref 11.5–15.5)
Smear Review: ADEQUATE
WBC: 3.3 10*3/uL — ABNORMAL LOW (ref 4.0–10.5)
nRBC: 0 % (ref 0.0–0.2)

## 2021-03-10 LAB — URINALYSIS, ROUTINE W REFLEX MICROSCOPIC
Bilirubin Urine: NEGATIVE
Glucose, UA: NEGATIVE mg/dL
Hgb urine dipstick: NEGATIVE
Leukocytes,Ua: NEGATIVE
Nitrite: NEGATIVE
Protein, ur: 30 mg/dL — AB
Specific Gravity, Urine: 1.023 (ref 1.005–1.030)
pH: 5.5 (ref 5.0–8.0)

## 2021-03-10 LAB — RESP PANEL BY RT-PCR (FLU A&B, COVID) ARPGX2
Influenza A by PCR: NEGATIVE
Influenza B by PCR: NEGATIVE
SARS Coronavirus 2 by RT PCR: NEGATIVE

## 2021-03-10 LAB — COMPREHENSIVE METABOLIC PANEL
ALT: 57 U/L — ABNORMAL HIGH (ref 0–44)
AST: 70 U/L — ABNORMAL HIGH (ref 15–41)
Albumin: 3.8 g/dL (ref 3.5–5.0)
Alkaline Phosphatase: 41 U/L (ref 38–126)
Anion gap: 13 (ref 5–15)
BUN: 34 mg/dL — ABNORMAL HIGH (ref 8–23)
CO2: 23 mmol/L (ref 22–32)
Calcium: 8.5 mg/dL — ABNORMAL LOW (ref 8.9–10.3)
Chloride: 98 mmol/L (ref 98–111)
Creatinine, Ser: 1.59 mg/dL — ABNORMAL HIGH (ref 0.44–1.00)
GFR, Estimated: 34 mL/min — ABNORMAL LOW (ref >60)
Glucose, Bld: 113 mg/dL — ABNORMAL HIGH (ref 70–99)
Potassium: 3.7 mmol/L (ref 3.5–5.1)
Sodium: 134 mmol/L — ABNORMAL LOW (ref 135–145)
Total Bilirubin: 0.6 mg/dL (ref 0.3–1.2)
Total Protein: 6.4 g/dL — ABNORMAL LOW (ref 6.5–8.1)

## 2021-03-10 LAB — TROPONIN I (HIGH SENSITIVITY): Troponin I (High Sensitivity): 6 ng/L (ref <18)

## 2021-03-10 MED ORDER — IOHEXOL 350 MG/ML SOLN
100.0000 mL | Freq: Once | INTRAVENOUS | Status: AC | PRN
  Administered 2021-03-10: 04:00:00 50 mL via INTRAVENOUS

## 2021-03-10 MED ORDER — SODIUM CHLORIDE 0.9 % IV BOLUS
500.0000 mL | Freq: Once | INTRAVENOUS | Status: AC
  Administered 2021-03-10: 03:00:00 500 mL via INTRAVENOUS

## 2021-03-10 NOTE — ED Provider Notes (Signed)
[facility] EMERGENCY DEPT Provider Note   CSN: [id number] Arrival date & time: 03/09/21  2305     History Chief Complaint  Patient presents with   AMS    Cynthia [name] is a 75 y.o. female.  The history is provided by a relative.  Altered Mental Status Presenting symptoms: confusion and memory loss   Severity:  Moderate Most recent episode:  More than 2 days ago (memory loss for over a month) Episode history:  Single Duration: 3 days since having URI symptoms and reported an isolated fever. Timing:  Constant Progression:  Unchanged Chronicity:  New Context: not alcohol use, not head injury and not homeless   Associated symptoms: no abdominal pain, no agitation, no decreased appetite, no hallucinations, no headaches, no nausea, no rash, no vomiting and no weakness   Associated symptoms comment:  One episode of diarrhea on Monday      Past Medical History:  Diagnosis Date   Breast cancer ([place])    Colon polyps    Depression    history of   Diverticulitis    GERD (gastroesophageal reflux disease)    Hypertension    Osteoporosis    Seizures ([place])    Stroke [facility])     Patient Active Problem List   Diagnosis Date Noted   Ductal carcinoma in situ (DCIS) of left breast 07/23/2020   Partial symptomatic epilepsy with complex partial seizures, not intractable, without status epilepticus ([place]) 06/08/2017   Numbness and tingling 10/03/2016   Cognitive deficit due to recent stroke    Left hemiparesis ([place]) 03/29/2016   Left-sided visual neglect 03/29/2016   Vascular headache    Benign essential HTN    Hyponatremia    Acute blood loss anemia    TBI (traumatic brain injury) ([place])    Intracerebral hemorrhage 03/20/2016   ICH (intracerebral hemorrhage) ([place]) 03/20/2016   Colon polyps    Osteoporosis    HTN (hypertension) 01/08/2013    Past Surgical History:  Procedure Laterality Date   BREAST LUMPECTOMY WITH RADIOACTIVE SEED LOCALIZATION Left  08/28/2020   Procedure: LEFT BREAST LUMPECTOMY WITH RADIOACTIVE SEED LOCALIZATION;  Surgeon: Stark Klein, MD;  Location: [place];  Service: General;  Laterality: Left;  RNFA   IR GENERIC HISTORICAL  08/05/2016   IR ANGIO VERTEBRAL SEL VERTEBRAL UNI R MOD SED 08/05/2016 Luanne Bras, MD [facility]   IR GENERIC HISTORICAL  08/05/2016   IR ANGIO INTRA EXTRACRAN SEL COM CAROTID INNOMINATE BILAT MOD SED 08/05/2016 Luanne Bras, MD [facility]   IR GENERIC HISTORICAL  08/05/2016   IR ANGIO VERTEBRAL SEL SUBCLAVIAN INNOMINATE UNI L MOD SED 08/05/2016 Luanne Bras, MD [facility]   MOUTH SURGERY     TONSILLECTOMY     TUBAL LIGATION       OB History   No obstetric history on file.     Family History  Problem Relation Age of Onset   Heart disease Mother    Cancer Mother        lung   Stroke Paternal Grandmother    Stomach cancer Maternal Grandfather     Social History   Tobacco Use   Smoking status: Never   Smokeless tobacco: Never  Vaping Use   Vaping Use: Never used  Substance Use Topics   Alcohol use: Yes    Alcohol/week: 1.0 standard drink    Types: 1 Glasses of wine per week    Comment: 1-2 wine in evening   Drug use: Not Currently    Home  Medications Prior to Admission medications   Medication Sig Start Date End Date Taking? Authorizing Provider  acetaminophen (TYLENOL) 500 MG tablet Take 1,000 mg by mouth every 6 (six) hours as needed for moderate pain or headache.    [provider]  alendronate (FOSAMAX) 70 MG tablet TAKE 1 TABLET BY MOUTH EVERY 7 DAYS. TAKE WITH FULL GLASS OF WATER ON AN EMPTY STOMACH Patient taking differently: Take 70 mg by mouth every Sunday. 05/06/20   Susy Frizzle, MD  ALPRAZolam Duanne Moron) 0.5 MG tablet TAKE 1/2 TABLET BY MOUTH 3 TIMES A DAY AS NEEDED FOR ANXIETY 01/11/21   Susy Frizzle, MD  Calcium Carb-Cholecalciferol (CALCIUM CARBONATE-VITAMIN D3) 600-400 MG-UNIT TABS Take 1 tablet by mouth 2 (two) times  daily.    [provider]  Cetirizine HCl 10 MG TBDP Take 1 tablet by mouth at bedtime. Aller-Tec (from [facility])    [provider]  EPINEPHRINE 0.3 mg/0.3 mL IJ SOAJ injection USE AS DIRECTED 03/05/21   Susy Frizzle, MD  famotidine (PEPCID) 20 MG tablet TAKE 1 TABLET BY MOUTH TWICE A DAY 09/21/20   Susy Frizzle, MD  ferrous sulfate 325 (65 FE) MG tablet Take 325 mg by mouth daily.    [provider]  levETIRAcetam (KEPPRA) 250 MG tablet Being tapered to off under neurologist direction 03/01/21   Magrinat, Virgie Dad, MD  losartan (COZAAR) 100 MG tablet TAKE 1 TABLET BY MOUTH EVERY DAY Patient taking differently: Take 100 mg by mouth daily. 06/11/20   Susy Frizzle, MD  traMADol (ULTRAM) 50 MG tablet Take 1 tablet (50 mg total) by mouth every 6 (six) hours as needed for moderate pain or severe pain. Patient not taking: Reported on 03/05/2021 08/28/20   Stark Klein, MD  vitamin B-12 (CYANOCOBALAMIN) 1000 MCG tablet Take 1,000 mcg by mouth daily.    [provider]  zinc gluconate 50 MG tablet Take 50 mg by mouth daily.    [provider]    Allergies    Other, Acyclovir and related, Codeine, and Penicillins  Review of Systems   Review of Systems  Constitutional:  Positive for fatigue. Negative for decreased appetite.  HENT:  Positive for congestion.   Eyes:  Negative for redness.  Respiratory:  Negative for cough.   Cardiovascular:  Negative for leg swelling.  Gastrointestinal:  Negative for abdominal pain, nausea and vomiting.  Genitourinary:  Negative for dysuria.  Skin:  Negative for rash.  Neurological:  Negative for dizziness, facial asymmetry, speech difficulty, weakness and headaches.  Psychiatric/Behavioral:  Positive for confusion and memory loss. Negative for agitation and hallucinations.   All other systems reviewed and are negative.  Physical Exam Updated Vital Signs BP (!) 108/59 (BP Location: Right Arm)   Pulse 76    Temp 98.8 F (37.1 C) (Oral)   Resp 19   Ht 5\' 6"  (1.676 m)   Wt 68.5 kg   SpO2 98%   BMI 24.37 kg/m   Physical Exam Vitals and nursing note reviewed.  Constitutional:      General: She is not in acute distress.    Appearance: Normal appearance.  HENT:     Head: Normocephalic and atraumatic.     Nose: Nose normal.     Mouth/Throat:     Mouth: Mucous membranes are moist.     Pharynx: Oropharynx is clear.  Eyes:     Extraocular Movements: Extraocular movements intact.     Pupils: Pupils are equal, round, and reactive  to light.  Cardiovascular:     Rate and Rhythm: Normal rate and regular rhythm.     Pulses: Normal pulses.     Heart sounds: Normal heart sounds.  Pulmonary:     Effort: Pulmonary effort is normal.     Breath sounds: Normal breath sounds.  Abdominal:     General: Abdomen is flat. Bowel sounds are normal.     Palpations: Abdomen is soft.     Tenderness: There is no abdominal tenderness. There is no guarding.  Musculoskeletal:        General: Normal range of motion.     Cervical back: Normal range of motion and neck supple.  Skin:    General: Skin is warm and dry.     Capillary Refill: Capillary refill takes less than 2 seconds.  Neurological:     General: No focal deficit present.     Mental Status: She is alert and oriented to person, place, and time.     Deep Tendon Reflexes: Reflexes normal.  Psychiatric:        Mood and Affect: Mood normal.        Behavior: Behavior normal.    ED Results / Procedures / Treatments   Labs (all labs ordered are listed, but only abnormal results are displayed) Results for orders placed or performed during the hospital encounter of 03/09/21  Resp Panel by RT-PCR (Flu A&B, Covid) Nasopharyngeal Swab   Specimen: Nasopharyngeal Swab; Nasopharyngeal(NP) swabs in vial transport medium  Result Value Ref Range   SARS Coronavirus 2 by RT PCR NEGATIVE NEGATIVE   Influenza A by PCR NEGATIVE NEGATIVE   Influenza B by PCR  NEGATIVE NEGATIVE  CBC with Differential/Platelet  Result Value Ref Range   WBC 3.3 (L) 4.0 - 10.5 K/uL   RBC 3.91 3.87 - 5.11 MIL/uL   Hemoglobin 11.9 (L) 12.0 - 15.0 g/dL   HCT 35.0 (L) 36.0 - 46.0 %   MCV 89.5 80.0 - 100.0 fL   MCH 30.4 26.0 - 34.0 pg   MCHC 34.0 30.0 - 36.0 g/dL   RDW 12.5 11.5 - 15.5 %   Platelets 169 150 - 400 K/uL   nRBC 0.0 0.0 - 0.2 %   Neutrophils Relative % 67 %   Neutro Abs 2.2 1.7 - 7.7 K/uL   Lymphocytes Relative 26 %   Lymphs Abs 0.9 0.7 - 4.0 K/uL   Monocytes Relative 6 %   Monocytes Absolute 0.2 0.1 - 1.0 K/uL   Eosinophils Relative 0 %   Eosinophils Absolute 0.0 0.0 - 0.5 K/uL   Basophils Relative 0 %   Basophils Absolute 0.0 0.0 - 0.1 K/uL   RBC Morphology MORPHOLOGY UNREMARKABLE    Smear Review PLATELETS APPEAR ADEQUATE    Immature Granulocytes 1 %   Abs Immature Granulocytes 0.02 0.00 - 0.07 K/uL  Comprehensive metabolic panel  Result Value Ref Range   Sodium 134 (L) 135 - 145 mmol/L   Potassium 3.7 3.5 - 5.1 mmol/L   Chloride 98 98 - 111 mmol/L   CO2 23 22 - 32 mmol/L   Glucose, Bld 113 (H) 70 - 99 mg/dL   BUN 34 (H) 8 - 23 mg/dL   Creatinine, Ser 1.59 (H) 0.44 - 1.00 mg/dL   Calcium 8.5 (L) 8.9 - 10.3 mg/dL   Total Protein 6.4 (L) 6.5 - 8.1 g/dL   Albumin 3.8 3.5 - 5.0 g/dL   AST 70 (H) 15 - 41 U/L   ALT 57 (H) 0 -  44 U/L   Alkaline Phosphatase 41 38 - 126 U/L   Total Bilirubin 0.6 0.3 - 1.2 mg/dL   GFR, Estimated 34 (L) >60 mL/min   Anion gap 13 5 - 15  Urinalysis, Routine w reflex microscopic  Result Value Ref Range   Color, Urine YELLOW YELLOW   APPearance HAZY (A) CLEAR   Specific Gravity, Urine 1.023 1.005 - 1.030   pH 5.5 5.0 - 8.0   Glucose, UA NEGATIVE NEGATIVE mg/dL   Hgb urine dipstick NEGATIVE NEGATIVE   Bilirubin Urine NEGATIVE NEGATIVE   Ketones, ur TRACE (A) NEGATIVE mg/dL   Protein, ur 30 (A) NEGATIVE mg/dL   Nitrite NEGATIVE NEGATIVE   Leukocytes,Ua NEGATIVE NEGATIVE   RBC / HPF 0-5 0 - 5 RBC/hpf   WBC,  UA 0-5 0 - 5 WBC/hpf   Squamous Epithelial / LPF 0-5 0 - 5   Mucus PRESENT    Hyaline Casts, UA PRESENT   Troponin I (High Sensitivity)  Result Value Ref Range   Troponin I (High Sensitivity) 6 <18 ng/L   DG Chest 1 View  Result Date: 03/10/2021 CLINICAL DATA:  Altered mental status. EXAM: CHEST  1 VIEW COMPARISON:  None. FINDINGS: Heart and mediastinal contours are within normal limits. No focal opacities or effusions. No acute bony abnormality. IMPRESSION: No active cardiopulmonary disease. Electronically Signed   By: Rolm Baptise M.D.   On: 03/10/2021 00:06   CT Head Wo Contrast  Result Date: 03/10/2021 CLINICAL DATA:  Altered mental status EXAM: CT HEAD WITHOUT CONTRAST TECHNIQUE: Contiguous axial images were obtained from the base of the skull through the vertex without intravenous contrast. COMPARISON:  03/21/2016 FINDINGS: Brain: Encephalomalacia in the right frontal lobe in area of prior hemorrhage. There is atrophy and chronic small vessel disease changes. No acute intracranial abnormality. Specifically, no hemorrhage, hydrocephalus, mass lesion, acute infarction, or significant intracranial injury. Vascular: No hyperdense vessel or unexpected calcification. Skull: No acute calvarial abnormality. Sinuses/Orbits: No acute findings Other: None IMPRESSION: Right frontal encephalomalacia. Atrophy, chronic microvascular disease. No acute intracranial abnormality. Electronically Signed   By: Rolm Baptise M.D.   On: 03/10/2021 00:07    See muse for EKG    Radiology DG Chest 1 View  Result Date: 03/10/2021 CLINICAL DATA:  Altered mental status. EXAM: CHEST  1 VIEW COMPARISON:  None. FINDINGS: Heart and mediastinal contours are within normal limits. No focal opacities or effusions. No acute bony abnormality. IMPRESSION: No active cardiopulmonary disease. Electronically Signed   By: Rolm Baptise M.D.   On: 03/10/2021 00:06   CT Head Wo Contrast  Result Date: 03/10/2021 CLINICAL DATA:   Altered mental status EXAM: CT HEAD WITHOUT CONTRAST TECHNIQUE: Contiguous axial images were obtained from the base of the skull through the vertex without intravenous contrast. COMPARISON:  03/21/2016 FINDINGS: Brain: Encephalomalacia in the right frontal lobe in area of prior hemorrhage. There is atrophy and chronic small vessel disease changes. No acute intracranial abnormality. Specifically, no hemorrhage, hydrocephalus, mass lesion, acute infarction, or significant intracranial injury. Vascular: No hyperdense vessel or unexpected calcification. Skull: No acute calvarial abnormality. Sinuses/Orbits: No acute findings Other: None IMPRESSION: Right frontal encephalomalacia. Atrophy, chronic microvascular disease. No acute intracranial abnormality. Electronically Signed   By: Rolm Baptise M.D.   On: 03/10/2021 00:07    Procedures Procedures   Medications Ordered in ED Medications  sodium chloride 0.9 % bolus 500 mL (500 mLs Intravenous New Bag/Given 03/10/21 0248)    ED Course  I have reviewed the triage  vital signs and the nursing notes.  Pertinent labs & imaging results that were available during my care of the patient were reviewed by me and considered in my medical decision making (see chart for details).   I suspect this is not truly AMS but more meory disorder.  We have ruled out  heart attack, stroke and infection.  Will refer patient to neurology as an outpatient for neurocognitive testing.    Anaid Haney was evaluated in Emergency Department on 03/10/2021 for the symptoms described in the history of present illness. She was evaluated in the context of the global COVID-19 pandemic, which necessitated consideration that the patient might be at risk for infection with the SARS-CoV-2 virus that causes COVID-19. Institutional protocols and algorithms that pertain to the evaluation of patients at risk for COVID-19 are in a state of rapid change based on information released by regulatory  bodies including the CDC and federal and state organizations. These policies and algorithms were followed during the patient's care in the ED.     Final Clinical Impression(s) / ED Diagnoses Return for intractable cough, coughing up blood, fevers > 100.4 unrelieved by medication, shortness of breath, intractable vomiting, chest pain, shortness of breath, weakness, numbness, changes in speech, facial asymmetry, abdominal pain, passing out, Inability to tolerate liquids or food, cough, altered mental status or any concerns. No signs of systemic illness or infection. The patient is nontoxic-appearing on exam and vital signs are within normal limits. I have reviewed the triage vital signs and the nursing notes. Pertinent labs & imaging results that were available during my care of the patient were reviewed by me and considered in my medical decision making (see chart for details). After history, exam, and medical workup I feel the patient has been appropriately medically screened and is safe for discharge home. Pertinent diagnoses were discussed with the patient. Patient was given return precautions.   Rx / DC Orders ED Discharge Orders     None        Mykle Pascua, MD 03/10/21 [phone]

## 2021-03-11 LAB — URINE CULTURE: Culture: NO GROWTH

## 2021-03-12 ENCOUNTER — Telehealth: Payer: Self-pay | Admitting: Family Medicine

## 2021-03-12 NOTE — Telephone Encounter (Signed)
Patient called to follow up on recent liver function test results performed at office of Dr. Rolanda Lundborg. Patient also experienced a lot of confusion and memory issues while dealing with stomach issues over the past few days. Negative covid test. Please advise at [phone].

## 2021-03-16 NOTE — Telephone Encounter (Signed)
Patient will need to be seen ASAP.   If unable to come to office, please advise to go to [facility].

## 2021-03-16 NOTE — Telephone Encounter (Signed)
Returned pt call, lm

## 2021-03-19 ENCOUNTER — Other Ambulatory Visit: Payer: Self-pay

## 2021-03-19 ENCOUNTER — Ambulatory Visit: Payer: Medicare PPO | Admitting: Nurse Practitioner

## 2021-03-19 VITALS — BP 130/60 | HR 83 | Temp 97.9°F | Ht 66.0 in | Wt 144.4 lb

## 2021-03-19 DIAGNOSIS — R1031 Right lower quadrant pain: Secondary | ICD-10-CM

## 2021-03-19 DIAGNOSIS — I10 Essential (primary) hypertension: Secondary | ICD-10-CM

## 2021-03-19 NOTE — Progress Notes (Signed)
39    Subjective:    Patient ID: Cynthia [name], female    DOB: 1946/07/07, 75 y.o.   MRN: [id number]  HPI: Cynthia [name] is a 75 y.o. female presenting for ED follow up.  Chief Complaint  Patient presents with   Follow-up    ER follow up for liver functions, told that liver function panel needs o be addressed, reason for trip to er, had some confusion and fatigue, believed that she had stroke. Stroke ruled out. still feeling weak, not sleeping, lack of appetite     ER FOLLOW UP Time since discharge: ~10 days ago   Hospital/facility: [facility]  Diagnosis: altered mental status   Procedures/tests: chest x-ray, CT head, CT angio head/neck  Consultants: none  New medications: none  Discharge instructions:  increased hydration, follow up with PCP  Status: better  Patient reports she is feeling "so much better than last week." Feels mental status is very close to baseline. Still doesn't feel "great.". No appetite - has to remind self to eat. Is having some abdominal pain that started yesterday.    ABDOMINAL PAIN  Duration: acute - days Onset: comes and goes Severity: 7-8/10 Quality:sharp shooting, cramping Location:   RLQ  Episode duration: less than 30 seconds Radiation: no Frequency: intermittent Alleviating factors: sleeping, Tylenol, eating Aggravating factors: not eating Status: stable Treatments attempted: Tylenol Fever: felt warm today Chills/body aches: yes Nausea: no Vomiting: no Weight loss: no Decreased appetite: yes Diarrhea: yes Constipation: no Blood in stool: no Heartburn: no Jaundice: no Rash: no Dysuria/urinary frequency: no Hematuria: no  Allergies  Allergen Reactions   Other Anaphylaxis    FIRE ANTS Insect stings / bites   Acyclovir And Related     Unknown reaction   Codeine     Syncope    Penicillins Rash    Has patient had a PCN reaction causing immediate rash, facial/tongue/throat swelling, SOB or lightheadedness with  hypotension: Unknown Has patient had a PCN reaction causing severe rash involving mucus membranes or skin necrosis: No Has patient had a PCN reaction that required hospitalization No Has patient had a PCN reaction occurring within the last 10 years: No If all of the above answers are "NO", then may proceed with Cephalosporin use.     Outpatient Encounter Medications as of 03/19/2021  Medication Sig Note   acetaminophen (TYLENOL) 500 MG tablet Take 1,000 mg by mouth every 6 (six) hours as needed for moderate pain or headache.    alendronate (FOSAMAX) 70 MG tablet TAKE 1 TABLET BY MOUTH EVERY 7 DAYS. TAKE WITH FULL GLASS OF WATER ON AN EMPTY STOMACH (Patient taking differently: Take 70 mg by mouth every Sunday.)    ALPRAZolam (XANAX) 0.5 MG tablet TAKE 1/2 TABLET BY MOUTH 3 TIMES A DAY AS NEEDED FOR ANXIETY    Calcium Carb-Cholecalciferol (CALCIUM CARBONATE-VITAMIN D3) 600-400 MG-UNIT TABS Take 1 tablet by mouth 2 (two) times daily.    Cetirizine HCl 10 MG TBDP Take 1 tablet by mouth at bedtime. Aller-Tec (from [employer])    EPINEPHRINE 0.3 mg/0.3 mL IJ SOAJ injection USE AS DIRECTED    famotidine (PEPCID) 20 MG tablet TAKE 1 TABLET BY MOUTH TWICE A DAY    ferrous sulfate 325 (65 FE) MG tablet Take 325 mg by mouth daily.    levETIRAcetam (KEPPRA) 250 MG tablet Being tapered to off under neurologist direction 03/05/2021: Taking every other day for 30 days    losartan (COZAAR) 100 MG tablet TAKE 1 TABLET BY MOUTH EVERY DAY (  Patient taking differently: Take 100 mg by mouth daily.)    vitamin B-12 (CYANOCOBALAMIN) 1000 MCG tablet Take 1,000 mcg by mouth daily.    zinc gluconate 50 MG tablet Take 50 mg by mouth daily.    [DISCONTINUED] traMADol (ULTRAM) 50 MG tablet Take 1 tablet (50 mg total) by mouth every 6 (six) hours as needed for moderate pain or severe pain.    No facility-administered encounter medications on file as of 03/19/2021.    Patient Active Problem List   Diagnosis Date Noted   Ductal  carcinoma in situ (DCIS) of left breast 07/23/2020   Partial symptomatic epilepsy with complex partial seizures, not intractable, without status epilepticus ([place]) 06/08/2017   Numbness and tingling 10/03/2016   Cognitive deficit due to recent stroke    Left hemiparesis ([place]) 03/29/2016   Left-sided visual neglect 03/29/2016   Vascular headache    Benign essential HTN    Hyponatremia    Acute blood loss anemia    TBI (traumatic brain injury) ([place])    Intracerebral hemorrhage 03/20/2016   ICH (intracerebral hemorrhage) ([place]) 03/20/2016   Colon polyps    Osteoporosis    HTN (hypertension) 01/08/2013    Past Medical History:  Diagnosis Date   Breast cancer ([place])    Colon polyps    Depression    history of   Diverticulitis    GERD (gastroesophageal reflux disease)    Hypertension    Osteoporosis    Seizures ([place])    Stroke ([facility])     Relevant past medical, surgical, family and social history reviewed and updated as indicated. Interim medical history since our last visit reviewed.  Review of Systems Per HPI unless specifically indicated above     Objective:    BP 130/60   Pulse 83   Temp 97.9 F (36.6 C)   Ht 5\' 6"  (1.676 m)   Wt 144 lb 6.4 oz (65.5 kg)   SpO2 96%   BMI 23.31 kg/m   Wt Readings from Last 3 Encounters:  03/19/21 144 lb 6.4 oz (65.5 kg)  03/09/21 151 lb (68.5 kg)  03/01/21 151 lb 6.4 oz (68.7 kg)    Physical Exam Vitals and nursing note reviewed.  Constitutional:      General: She is not in acute distress.    Appearance: Normal appearance. She is not toxic-appearing.  HENT:     Head: Normocephalic and atraumatic.     Nose: Nose normal. No congestion or rhinorrhea.     Mouth/Throat:     Mouth: Mucous membranes are moist.     Pharynx: Oropharynx is clear. No oropharyngeal exudate or posterior oropharyngeal erythema.  Eyes:     General: No scleral icterus.       Right eye: No discharge.        Left eye: No discharge.     Extraocular Movements:  Extraocular movements intact.  Cardiovascular:     Rate and Rhythm: Normal rate and regular rhythm.     Heart sounds: Normal heart sounds. No murmur heard. Pulmonary:     Effort: Pulmonary effort is normal. No respiratory distress.     Breath sounds: Normal breath sounds. No wheezing, rhonchi or rales.  Abdominal:     General: Abdomen is flat. Bowel sounds are normal. There is no distension.     Palpations: Abdomen is soft. There is no mass.     Tenderness: There is abdominal tenderness in the right lower quadrant. There is no right CVA tenderness, left CVA tenderness  or guarding.  Musculoskeletal:        General: Normal range of motion.     Cervical back: Normal range of motion and neck supple. No rigidity.     Right lower leg: No edema.     Left lower leg: No edema.  Lymphadenopathy:     Cervical: No cervical adenopathy.  Skin:    General: Skin is warm and dry.     Capillary Refill: Capillary refill takes less than 2 seconds.     Coloration: Skin is not jaundiced or pale.     Findings: No erythema.  Neurological:     Mental Status: She is alert and oriented to person, place, and time.     Motor: No weakness.     Gait: Gait normal.  Psychiatric:        Mood and Affect: Mood normal.        Behavior: Behavior normal.        Thought Content: Thought content normal.        Judgment: Judgment normal.      Assessment & Plan:  1. Right lower quadrant abdominal pain It sounds like patient may have been dehydrated which likely led to altered mental status.  She is doing much better and is caring for self, driving herself,.  However she does still have a decreased appetite.  Coupled with right lower quadrant abdominal pain that is very mild that started yesterday, will check urinalysis as well as blood counts and recheck liver function.  There is slight tenderness today upon palpation of the right lower quadrant, however her abdomen is not rigid, there is no CVA tenderness,.  encourage  staying plenty hydrated with at least 64 ounces of water daily, try to eat soft foods high in protein.  If abdominal pain worsens greatly over the weekend, seek urgent care.  Otherwise, follow-up pending blood work.  - COMPLETE METABOLIC PANEL WITH GFR - CBC with Differential - Urinalysis, Routine w reflex microscopic - Urine Culture   Follow up plan: Return for pending lab work.

## 2021-03-20 ENCOUNTER — Encounter: Payer: Self-pay | Admitting: Nurse Practitioner

## 2021-03-20 LAB — COMPLETE METABOLIC PANEL WITH GFR
AG Ratio: 1.3 (calc) (ref 1.0–2.5)
ALT: 22 U/L (ref 6–29)
AST: 18 U/L (ref 10–35)
Albumin: 3.8 g/dL (ref 3.6–5.1)
Alkaline phosphatase (APISO): 54 U/L (ref 37–153)
BUN/Creatinine Ratio: 22 (calc) (ref 6–22)
BUN: 21 mg/dL (ref 7–25)
CO2: 27 mmol/L (ref 20–32)
Calcium: 9 mg/dL (ref 8.6–10.4)
Chloride: 100 mmol/L (ref 98–110)
Creat: 0.96 mg/dL — ABNORMAL HIGH (ref 0.60–0.93)
GFR, Est African American: 68 mL/min/{1.73_m2} (ref >=60)
GFR, Est Non African American: 59 mL/min/{1.73_m2} — ABNORMAL LOW (ref >=60)
Globulin: 3 g/dL (calc) (ref 1.9–3.7)
Glucose, Bld: 102 mg/dL — ABNORMAL HIGH (ref 65–99)
Potassium: 4 mmol/L (ref 3.5–5.3)
Sodium: 137 mmol/L (ref 135–146)
Total Bilirubin: 0.8 mg/dL (ref 0.2–1.2)
Total Protein: 6.8 g/dL (ref 6.1–8.1)

## 2021-03-20 LAB — CBC WITH DIFFERENTIAL/PLATELET
Absolute Monocytes: 1397 cells/uL — ABNORMAL HIGH (ref 200–950)
Basophils Absolute: 25 cells/uL (ref 0–200)
Basophils Relative: 0.2 %
Eosinophils Absolute: 13 cells/uL — ABNORMAL LOW (ref 15–500)
Eosinophils Relative: 0.1 %
HCT: 32.7 % — ABNORMAL LOW (ref 35.0–45.0)
Hemoglobin: 11 g/dL — ABNORMAL LOW (ref 11.7–15.5)
Lymphs Abs: 2578 cells/uL (ref 850–3900)
MCH: 30.6 pg (ref 27.0–33.0)
MCHC: 33.6 g/dL (ref 32.0–36.0)
MCV: 90.8 fL (ref 80.0–100.0)
MPV: 9.7 fL (ref 7.5–12.5)
Monocytes Relative: 11 %
Neutro Abs: 8687 cells/uL — ABNORMAL HIGH (ref 1500–7800)
Neutrophils Relative %: 68.4 %
Platelets: 485 10*3/uL — ABNORMAL HIGH (ref 140–400)
RBC: 3.6 10*6/uL — ABNORMAL LOW (ref 3.80–5.10)
RDW: 12.5 % (ref 11.0–15.0)
Total Lymphocyte: 20.3 %
WBC: 12.7 10*3/uL — ABNORMAL HIGH (ref 3.8–10.8)

## 2021-03-20 LAB — URINALYSIS, ROUTINE W REFLEX MICROSCOPIC
Bilirubin Urine: NEGATIVE
Glucose, UA: NEGATIVE
Hgb urine dipstick: NEGATIVE
Ketones, ur: NEGATIVE
Leukocytes,Ua: NEGATIVE
Nitrite: NEGATIVE
Protein, ur: NEGATIVE
Specific Gravity, Urine: 1.016 (ref 1.001–1.035)
pH: 5.5 (ref 5.0–8.0)

## 2021-03-21 LAB — URINE CULTURE
MICRO NUMBER:: 12097203
SPECIMEN QUALITY:: ADEQUATE

## 2021-03-22 ENCOUNTER — Telehealth: Payer: Self-pay | Admitting: Nurse Practitioner

## 2021-03-22 MED ORDER — MOXIFLOXACIN HCL 400 MG PO TABS: 400.0000 mg | ORAL_TABLET | Freq: Every day | ORAL | 0 refills | Status: AC

## 2021-03-22 NOTE — Addendum Note (Signed)
Addended by: Noemi Chapel A on: 03/22/2021 05:39 PM   Modules accepted: Orders

## 2021-03-22 NOTE — Telephone Encounter (Signed)
Called patient and discussed lab results.   Urine negative for acute infection.  Electrolytes, kidney function, liver enzymes much improved and kidney function almost back to baseline.  Blood counts suggestive of acute infection and patient is still having lower abdominal pain, fevers, diarrhea.  Will treat for possible diverticulitis.  Notify clinic if not feeling better by end of week.  Push fluids, soft diet.

## 2021-03-31 ENCOUNTER — Other Ambulatory Visit: Payer: Self-pay | Admitting: *Deleted

## 2021-03-31 MED ORDER — ALENDRONATE SODIUM 70 MG PO TABS: 70.0000 mg | ORAL_TABLET | ORAL | 1 refills | Status: DC

## 2021-03-31 MED ORDER — FAMOTIDINE 20 MG PO TABS: 20.0000 mg | ORAL_TABLET | Freq: Two times a day (BID) | ORAL | 2 refills | Status: AC

## 2021-04-02 ENCOUNTER — Other Ambulatory Visit: Payer: Self-pay

## 2021-04-02 MED ORDER — ALPRAZOLAM 0.5 MG PO TABS: 0.5000 mg | ORAL_TABLET | Freq: Three times a day (TID) | ORAL | 0 refills | Status: DC | PRN

## 2021-04-04 ENCOUNTER — Other Ambulatory Visit: Payer: Self-pay | Admitting: Neurology

## 2021-04-04 ENCOUNTER — Other Ambulatory Visit: Payer: Self-pay | Admitting: Family Medicine

## 2021-04-22 ENCOUNTER — Other Ambulatory Visit: Payer: Self-pay | Admitting: Family Medicine

## 2021-04-26 DIAGNOSIS — H40013 Open angle with borderline findings, low risk, bilateral: Secondary | ICD-10-CM | POA: Diagnosis not present

## 2021-04-30 ENCOUNTER — Ambulatory Visit: Payer: Medicare PPO | Admitting: Family Medicine

## 2021-04-30 ENCOUNTER — Other Ambulatory Visit: Payer: Self-pay

## 2021-04-30 ENCOUNTER — Encounter: Payer: Self-pay | Admitting: Family Medicine

## 2021-04-30 VITALS — BP 110/64 | HR 68 | Temp 98.7°F | Resp 16 | Ht 66.0 in | Wt 136.0 lb

## 2021-04-30 DIAGNOSIS — R410 Disorientation, unspecified: Secondary | ICD-10-CM | POA: Diagnosis not present

## 2021-04-30 DIAGNOSIS — I10 Essential (primary) hypertension: Secondary | ICD-10-CM | POA: Diagnosis not present

## 2021-04-30 DIAGNOSIS — R413 Other amnesia: Secondary | ICD-10-CM | POA: Diagnosis not present

## 2021-04-30 DIAGNOSIS — W57XXXA Bitten or stung by nonvenomous insect and other nonvenomous arthropods, initial encounter: Secondary | ICD-10-CM | POA: Diagnosis not present

## 2021-04-30 DIAGNOSIS — Z8673 Personal history of transient ischemic attack (TIA), and cerebral infarction without residual deficits: Secondary | ICD-10-CM

## 2021-04-30 MED ORDER — ESCITALOPRAM OXALATE 10 MG PO TABS: 10.0000 mg | ORAL_TABLET | Freq: Every day | ORAL | 2 refills | Status: DC
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
## 2021-05-03 ENCOUNTER — Other Ambulatory Visit: Payer: Self-pay | Admitting: Family Medicine

## 2021-05-03 LAB — B. BURGDORFI ANTIBODIES BY WB
B burgdorferi IgG Abs (IB): POSITIVE — AB
B burgdorferi IgM Abs (IB): NEGATIVE
Lyme Disease 18 kD IgG: REACTIVE — AB
Lyme Disease 23 kD IgG: NONREACTIVE
Lyme Disease 23 kD IgM: NONREACTIVE
Lyme Disease 28 kD IgG: REACTIVE — AB
Lyme Disease 30 kD IgG: NONREACTIVE
Lyme Disease 39 kD IgG: REACTIVE — AB
Lyme Disease 39 kD IgM: NONREACTIVE
Lyme Disease 41 kD IgG: REACTIVE — AB
Lyme Disease 41 kD IgM: NONREACTIVE
Lyme Disease 45 kD IgG: NONREACTIVE
Lyme Disease 58 kD IgG: REACTIVE — AB
Lyme Disease 66 kD IgG: NONREACTIVE
Lyme Disease 93 kD IgG: NONREACTIVE

## 2021-05-03 LAB — TSH: TSH: 1.94 mIU/L (ref 0.40–4.50)

## 2021-05-03 LAB — VITAMIN B12: Vitamin B-12: 738 pg/mL (ref 200–1100)

## 2021-05-03 MED ORDER — DOXYCYCLINE HYCLATE 100 MG PO TABS: 100.0000 mg | ORAL_TABLET | Freq: Two times a day (BID) | ORAL | 0 refills | Status: DC

## 2021-05-22 ENCOUNTER — Other Ambulatory Visit: Payer: Self-pay | Admitting: Family Medicine

## 2021-05-25 ENCOUNTER — Telehealth: Payer: Self-pay | Admitting: *Deleted

## 2021-05-25 NOTE — Telephone Encounter (Signed)
Received call from patient.   Reports that she has completed ABTx for Lyme disease.   States that she is not having Sx at this time. Does report that she continues to have redness and irritation at tick bite on calf.   Advised to schedule appointment for evaluation. Appointment scheduled.

## 2021-05-26 NOTE — Telephone Encounter (Signed)
Patient is concerned about the area as it still looks inflamed. States that she is concerned she did not remove entire tick. States that she would like PCP to examine.

## 2021-05-27 ENCOUNTER — Encounter: Payer: Self-pay | Admitting: Family Medicine

## 2021-05-27 ENCOUNTER — Ambulatory Visit: Payer: Medicare PPO | Admitting: Family Medicine

## 2021-05-27 ENCOUNTER — Other Ambulatory Visit: Payer: Self-pay

## 2021-05-27 VITALS — BP 130/72 | HR 82 | Temp 98.7°F | Resp 14 | Ht 66.0 in | Wt 139.0 lb

## 2021-05-27 DIAGNOSIS — L989 Disorder of the skin and subcutaneous tissue, unspecified: Secondary | ICD-10-CM

## 2021-05-27 MED ORDER — ALPRAZOLAM 0.5 MG PO TABS: 0.5000 mg | ORAL_TABLET | Freq: Three times a day (TID) | ORAL | 2 refills | Status: DC | PRN

## 2021-05-27 MED ORDER — ESCITALOPRAM OXALATE 10 MG PO TABS: 10.0000 mg | ORAL_TABLET | Freq: Every day | ORAL | 3 refills | Status: DC

## 2021-05-27 MED ORDER — MOMETASONE FUROATE 0.1 % EX CREA: TOPICAL_CREAM | Freq: Two times a day (BID) | CUTANEOUS | 1 refills | Status: DC

## 2021-05-27 NOTE — Progress Notes (Signed)
Subjective:    Patient ID: Cynthia [name], female    DOB: 01-17-1946, 75 y.o.   MRN: [id number]   Patient is a 75 year old Caucasian female who suffered a tick bite in early June on her posterior right calf.  Today in the area where the tick bit her there is an inflammatory nodule.  It is 1 cm in diameter.  It is slightly erythematous covered in white scale.  Is not itching.  It does not hurt.  There is no fluctuance.  However its not improving Past Medical History:  Diagnosis Date   Breast cancer ([place])    Colon polyps    Depression    history of   Diverticulitis    GERD (gastroesophageal reflux disease)    Hypertension    Osteoporosis    Seizures ([place])    Stroke [facility])    Past Surgical History:  Procedure Laterality Date   BREAST LUMPECTOMY WITH RADIOACTIVE SEED LOCALIZATION Left 08/28/2020   Procedure: LEFT BREAST LUMPECTOMY WITH RADIOACTIVE SEED LOCALIZATION;  Surgeon: Stark Klein, MD;  Location: [facility];  Service: General;  Laterality: Left;  RNFA   IR GENERIC HISTORICAL  08/05/2016   IR ANGIO VERTEBRAL SEL VERTEBRAL UNI R MOD SED 08/05/2016 Luanne Bras, MD [facility]   IR GENERIC HISTORICAL  08/05/2016   IR ANGIO INTRA EXTRACRAN SEL COM CAROTID INNOMINATE BILAT MOD SED 08/05/2016 Luanne Bras, MD [facility]   IR GENERIC HISTORICAL  08/05/2016   IR ANGIO VERTEBRAL SEL SUBCLAVIAN INNOMINATE UNI L MOD SED 08/05/2016 Luanne Bras, MD [facility]   MOUTH SURGERY     TONSILLECTOMY     TUBAL LIGATION     Current Outpatient Medications on File Prior to Visit  Medication Sig Dispense Refill   acetaminophen (TYLENOL) 500 MG tablet Take 1,000 mg by mouth every 6 (six) hours as needed for moderate pain or headache.     alendronate (FOSAMAX) 70 MG tablet TAKE 1 TABLET BY MOUTH ONCE A WEEK, EVERY 7 DAYS. TAKE WITH FULL GLASS OF WATER ON AN EMPTY STOMACH. 4 tablet 1   Calcium Carb-Cholecalciferol (CALCIUM CARBONATE-VITAMIN D3) 600-400 MG-UNIT TABS Take 1 tablet  by mouth 2 (two) times daily.     Cetirizine HCl 10 MG TBDP Take 1 tablet by mouth at bedtime. Aller-Tec (from [employer])     EPINEPHRINE 0.3 mg/0.3 mL IJ SOAJ injection USE AS DIRECTED 2 each 1   famotidine (PEPCID) 20 MG tablet Take 1 tablet (20 mg total) by mouth 2 (two) times daily. 180 tablet 2   ferrous sulfate 325 (65 FE) MG tablet Take 325 mg by mouth daily.     levETIRAcetam (KEPPRA) 250 MG tablet Being tapered to off under neurologist direction 30 tablet 2   losartan (COZAAR) 100 MG tablet TAKE 1 TABLET BY MOUTH EVERY DAY (Patient taking differently: Take 100 mg by mouth daily.) 90 tablet 3   vitamin B-12 (CYANOCOBALAMIN) 1000 MCG tablet Take 1,000 mcg by mouth daily.     zinc gluconate 50 MG tablet Take 50 mg by mouth daily.     No current facility-administered medications on file prior to visit.   Allergies  Allergen Reactions   Other Anaphylaxis    FIRE ANTS Insect stings / bites   Acyclovir And Related     Unknown reaction   Codeine     Syncope    Penicillins Rash    Has patient had a PCN reaction causing immediate rash, facial/tongue/throat swelling, SOB or lightheadedness with hypotension: Unknown Has patient  had a PCN reaction causing severe rash involving mucus membranes or skin necrosis: No Has patient had a PCN reaction that required hospitalization No Has patient had a PCN reaction occurring within the last 10 years: No If all of the above answers are "NO", then may proceed with Cephalosporin use.    Social History   Socioeconomic History   Marital status: Married    Spouse name: Not on file   Number of children: 3   Years of education: Not on file   Highest education level: Not on file  Occupational History    Comment: teacher, retired  Tobacco Use   Smoking status: Never   Smokeless tobacco: Never  Vaping Use   Vaping Use: Never used  Substance and Sexual Activity   Alcohol use: Yes    Alcohol/week: 1.0 standard drink    Types: 1 Glasses of wine per  week    Comment: 1-2 wine in evening   Drug use: Not Currently   Sexual activity: Not Currently    Comment: Married since 2005  Other Topics Concern   Not on file  Social History Narrative   Lives with husband and son   Right Handed   Drinks 1-2 cups daily.    Social Determinants of Health   Financial Resource Strain: Low Risk    Difficulty of Paying Living Expenses: Not very hard  Food Insecurity: No Food Insecurity   Worried About Charity fundraiser in the Last Year: Never true   Ran Out of Food in the Last Year: Never true  Transportation Needs: No Transportation Needs   Lack of Transportation (Medical): No   Lack of Transportation (Non-Medical): No  Physical Activity: Sufficiently Active   Days of Exercise per Week: 7 days   Minutes of Exercise per Session: 30 min  Stress: No Stress Concern Present   Feeling of Stress : Not at all  Social Connections: Moderately Isolated   Frequency of Communication with Friends and Family: More than three times a week   Frequency of Social Gatherings with Friends and Family: More than three times a week   Attends Religious Services: Never   Marine scientist or Organizations: No   Attends Music therapist: Never   Marital Status: Married  Human resources officer Violence: Not At Risk   Fear of Current or Ex-Partner: No   Emotionally Abused: No   Physically Abused: No   Sexually Abused: No     Review of Systems  All other systems reviewed and are negative.     Objective:   Physical Exam Vitals reviewed.  Cardiovascular:     Rate and Rhythm: Normal rate and regular rhythm.     Heart sounds: Normal heart sounds.  Pulmonary:     Effort: Pulmonary effort is normal. No respiratory distress.     Breath sounds: Normal breath sounds. No wheezing or rales.  Abdominal:     General: Bowel sounds are normal. There is no distension.     Palpations: Abdomen is soft.     Tenderness: There is no abdominal tenderness. There is  no rebound.  Musculoskeletal:       Legs:  Neurological:     Mental Status: She is alert and oriented to person, place, and time.     Cranial Nerves: No cranial nerve deficit.     Motor: No abnormal muscle tone.     Coordination: Coordination normal.     Deep Tendon Reflexes: Reflexes are normal and symmetric.  Psychiatric:        Behavior: Behavior normal.        Thought Content: Thought content normal.        Judgment: Judgment normal.          Assessment & Plan:   Skin lesion I believe this is a local inflammatory polyp or perhaps even a dermatofibroma from the previous tick bite.  I will try treating it with Elocon cream twice daily for 2 to 3 weeks to see if it will improve.  Consider a skin biopsy if persistent

## 2021-06-16 ENCOUNTER — Encounter: Payer: Self-pay | Admitting: Neurology

## 2021-06-16 ENCOUNTER — Ambulatory Visit: Payer: Medicare PPO | Admitting: Neurology

## 2021-06-16 VITALS — BP 157/86 | HR 76 | Ht 66.0 in | Wt 138.0 lb

## 2021-06-16 DIAGNOSIS — R202 Paresthesia of skin: Secondary | ICD-10-CM | POA: Diagnosis not present

## 2021-06-16 DIAGNOSIS — Z8673 Personal history of transient ischemic attack (TIA), and cerebral infarction without residual deficits: Secondary | ICD-10-CM

## 2021-06-16 NOTE — Patient Instructions (Signed)
I had a long discussion with the patient regarding her episodes of paresthesias which seem to have resolved and she has tapered and discontinued Keppra now for several months and is doing well.  Recommend she maintain strict control of hypertension with blood pressure goal below 140/90.  Continue ongoing treatment and follow-up for Lyme's disease with her primary physician.  She will return for follow-up in the future to see me only as needed and no routine schedule appointment was made.

## 2021-06-16 NOTE — Progress Notes (Signed)
[facility]  PATIENT: Cynthia [name] DOB: [birth year]   REASON FOR VISIT: Follow-up for complaints of numbness left side thought to be  partial seizures with history of intracerebral hemorrhage in July 2017 HISTORY FROM: Patient    HISTORY OF PRESENT ILLNESS:UPDATE 9/30/2019CM Cynthia [name], 75 year old female returns for follow-up with history of intracranial hemorrhage in July 2017.  She is also had episodes suggestive of simple partial seizures none recently.  She is currently on Keppra 500 mg XR daily.  She is on aspirin for secondary stroke prevention without recurrent stroke or TIA symptoms.  She has no bruising and no bleeding.  Blood pressure well controlled in the office today at 123/68.  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  She has a lot of anxiety related to an alcoholic husband.  She has Xanax to take as needed.  She exercises maybe 3 times a week.  She has stopped her yoga class was encouraged to get back into that.  She complains of decreased energy level.  She denies any visual difficulty speech or swallowing problems balance issues or falls.  No interval medical issues.  She returns for reevaluation   UPDATE 09/27/2018CM Cynthia [name], 75 year old female returns for follow-up with a history of intracranial hemorrhage in July 2017. She also has a history of episodes suggestive of simple partial seizures and was started on Keppra 500 mg daily. She has not had further episodes.She denies side effects to the medication. She remains on aspirin for secondary stroke prevention without recurrent stroke or TIA symptoms. She has minimal bruising and no bleeding.Blood pressure in the office today 129/71. She continues to teach part time. She denies any speech disturbance swallowing problems, visual difficulties, weakness, balance issues or falls. She returns for reevaluation  UPDATE 03/27/2018CM Cynthia [name], 75 year old female returns for follow-up with a history  of intracranial hemorrhage July 2017. When last seen she was having episodes of numbness left side lasting 5-10 minutes. EEG was ordered showing focal slowing on the right side which is to be expected given her previous intracerebral hemorrhage. She had another episode of transient paresthesias starting in the left shoulder and spreading gradually down into the fingertips of the left hand over   a few minutes. These episodes are suggestive of simple partial seizure. She was started on Keppra after her EEG was performed , she is currently taking Keppra XR 500 mg daily even though the EEG was negative for seizures. She has had one similar episode since it lasted several minutes shortly after starting the [place]. She remains on aspirin secondary stroke prevention. She denies any weakness speech disturbance swallowing problems visual difficulties and falls or balance issues. 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 She has had 2 of these events in the last 2 months. She had hospital admission in July 2017 for intracranial hemorrhage. Follow-up angiogram in November revealed no AV malformation or aneurysms. Her blood pressure does fluctuate however is well controlled in the office today at 122/64. She has had episodes where her left arm from her shoulder to her hand became numb and tingling however she denied any weakness with this ,one time her face was involved there again numbness but no weakness. She denies any speech disturbance, swallowing problems visual difficulty, problems with balance or falls or other associated symptoms. She denies headache. Her aspirin 0.81 was  restarted by her primary care physician on 09/22/2016. 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 75 year old right-handed woman who presented to the [facility] Emergency department after being sent from her doctor's office due to concern for possible stroke.The patient  reported that the night of 03/19/2016 (LKW, time unknown) she developed a sense of feeling disoriented and having difficulty speaking. When asked about her speech, she described that she had trouble focusing her thoughts and getting her words out. She reported that her sister also noticed that she seemed slow to respond to questions. She felt as if her speech was somewhat slurred as well. She did not endorse any weakness, numbness, vision changes, difficulty swallowing, or balance impairments. However, she had noticed that she will find her left arm in unusual postures and positions without being aware that it is doing anything. When she looked at it, she was able to control it without a problem and again denied any weakness or sensory changes. She  also had a headache for the past 2 days.  She denies any similar previous episodes. She denied any recent trauma to the head. She did note that she was working with her brother at her home and had been up on a ladder. She was getting off the ladder, she thought she was on the ground but instead was on the second step of the latter when she stumbled backwards. She says that she struck her bottom on a nearby chair, then fell to the floor landing on her bottom. She did not strike her head. She has not had any recent illness. She does not use daily antiplatelet therapy or anticoagulation. She uses occasional Advil for headache and pain and has taken a total of six 200 mg tablets since last night. She also reports that she took a baby aspirin earlier today as recommended by her sister because of her symptoms.CT brain on admission showed a large right frontal ICH. Patient was not administered IV t-PA  secondary to [place]. She was admitted to the neuro ICU for further evaluation and treatment. Her blood pressure was tightly controlled initially with IV drip and subsequently with oral medications. Follow-up CT scan showed stable appearance of the hemorrhage. There was mild cytotoxic edema but no hydrocephalus or any hemorrhage expansion. CT angiogram and CT venogram did not show any aneurysms or venous sinus thrombosis but there were engorged vessels in the region of the hemorrhage raising concern about a small AVM hence cerebral catheter angiogram was performed and findings of which are also indeterminate and showed abnormal right frontopolar branches .She had persistent left-sided weakness but her speech and word finding difficulties improved. She was transferred to the neurology floor in a condition remained stable. She was seen by physical occupational therapist and felt to be a good candidate for inpatient rehabilitation. She was accepted for transfer to rehabilitation in stable condition. She states she's done well since discharge. She has patient outpatient physical and occupational therapy. She is back to her baseline and has no complaints or deficits from her intracerebral hemorrhage. She does feel she gets tired easily and at times her left-sided balance and coordination may be off. She states her blood pressure is well controlled and today it is 137/87 in office. She is wondering if she needs follow-up imaging studies. She has no complaints  Update 06/17/2019 : She returns for follow-up after last visit a year ago with Cecille Rubin, nurse practitioner.  She states she is doing well and blood pressure is well controlled.  She had one brief episode of tingling in her left calf a few days ago.  It stayed localized and did not progress up to involve the rest of the left leg or the upper extremity.  She states a few days ago she had somewhat similar minor episode as well.  She does complain of having cramps in  her legs and thinks it may be due to dehydration as she is not drinking enough fluids.  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  She is tolerating Keppra XR 500 mg once daily very well without any side effects.  No other new complaints. Update 10/22/2020: She returns for follow-up after last visit in October 2020.  She is doing well she has had no episodes of paresthesias since starting Keppra in February 2018.  She has been diagnosed with intraductal breast cancer and underwent lumpectomy in December last year and is currently finishing her course of radiation.  She complains of feeling tired and fatigued wonders if this is from her stroke.  She is tolerating Keppra well without any side effects.  Her blood pressures well controlled and today it is 138/80.  She has no other new new neurological complaints.  She is wondering if she can stop Keppra and if she needs it since she has been episode free for 4 years  Update 02/22/2021 : She returns for follow-up after last visit 4 months ago.  She has reduced the Keppra initially to 250 twice daily and now has been taking it once a day for the last 6 weeks and has not not had any episodes of numbness or seizure-like episodes.  She has finished her lumpectomy for breast cancer as well as 4 weeks of radiation therapy and an upcoming appointment to see oncologist Dr. Jana Hakim in a few weeks.  She had EEG done on 11/17/2020 which was normal.  She has no complaints except her left foot little toe is swollen and red since this morning. Update 06/16/2021 : She returns for follow-up after last visit 4 months ago.  She is now been off Keppra for several months.  She has had no recurrent numbness or seizure-like episodes.  She states she had 5-6 tick bites earlier this  year in June and did not realize it.  She started having low-grade fever, excessive sleeping and tiredness.  She was eventually diagnosed with Lyme's disease after the blood test was done in August by her primary physician.  She was also having cognitive issues.  She is was tremulous and also off balance.  She has now finished a course of antibiotics doxycycline and started feeling better.  Blood pressure is usually better at home though today it is elevated in office at 157/86.  She has no other new neurological complaints. REVIEW OF SYSTEMS: Full 14 system review of systems performed and notable only for those listed, all others are neg:  Tingling, numbness, fatigue, cognitive difficulties, tremulousness, radiation for breast cancer, stress ALLERGIES: Allergies  Allergen Reactions   Other Anaphylaxis    FIRE ANTS Insect stings / bites   Acyclovir And  Related     Unknown reaction   Codeine     Syncope    Penicillins Rash    Has patient had a PCN reaction causing immediate rash, facial/tongue/throat swelling, SOB or lightheadedness with hypotension: Unknown Has patient had a PCN reaction causing severe rash involving mucus membranes or skin necrosis: No Has patient had a PCN reaction that required hospitalization No Has patient had a PCN reaction occurring within the last 10 years: No If all of the above answers are "NO", then may proceed with Cephalosporin use.     HOME MEDICATIONS: Outpatient Medications Prior to Visit  Medication Sig Dispense Refill   acetaminophen (TYLENOL) 500 MG tablet Take 1,000 mg by mouth every 6 (six) hours as needed for moderate pain or headache.     alendronate (FOSAMAX) 70 MG tablet TAKE 1 TABLET BY MOUTH ONCE A WEEK, EVERY 7 DAYS. TAKE WITH FULL GLASS OF WATER ON AN EMPTY STOMACH. 4 tablet 1   ALPRAZolam (XANAX) 0.5 MG tablet Take 1 tablet (0.5 mg total) by mouth 3 (three) times daily as needed for anxiety. 45 tablet 2   Calcium Carb-Cholecalciferol  (CALCIUM CARBONATE-VITAMIN D3) 600-400 MG-UNIT TABS Take 1 tablet by mouth 2 (two) times daily.     Cetirizine HCl 10 MG TBDP Take 1 tablet by mouth at bedtime. Aller-Tec (from [employer])     EPINEPHRINE 0.3 mg/0.3 mL IJ SOAJ injection USE AS DIRECTED 2 each 1   escitalopram (LEXAPRO) 10 MG tablet Take 1 tablet (10 mg total) by mouth daily. 90 tablet 3   famotidine (PEPCID) 20 MG tablet Take 1 tablet (20 mg total) by mouth 2 (two) times daily. 180 tablet 2   ferrous sulfate 325 (65 FE) MG tablet Take 325 mg by mouth daily.     losartan (COZAAR) 100 MG tablet TAKE 1 TABLET BY MOUTH EVERY DAY (Patient taking differently: Take 100 mg by mouth daily.) 90 tablet 3   mometasone (ELOCON) 0.1 % cream Apply topically in the morning and at bedtime. 15 g 1   vitamin B-12 (CYANOCOBALAMIN) 1000 MCG tablet Take 1,000 mcg by mouth daily.     zinc gluconate 50 MG tablet Take 50 mg by mouth daily.     levETIRAcetam (KEPPRA) 250 MG tablet Being tapered to off under neurologist direction 30 tablet 2   No facility-administered medications prior to visit.    PAST MEDICAL HISTORY: Past Medical History:  Diagnosis Date   Breast cancer ([place])    Colon polyps    Depression    history of   Diverticulitis    GERD (gastroesophageal reflux disease)    Hypertension    Osteoporosis    Seizures ([place])    Stroke ([place])     PAST SURGICAL HISTORY: Past Surgical History:  Procedure Laterality Date   BREAST LUMPECTOMY WITH RADIOACTIVE SEED LOCALIZATION Left 08/28/2020   Procedure: LEFT BREAST LUMPECTOMY WITH RADIOACTIVE SEED LOCALIZATION;  Surgeon: Stark Klein, MD;  Location: [place] [facility];  Service: General;  Laterality: Left;  RNFA   IR GENERIC HISTORICAL  08/05/2016   IR ANGIO VERTEBRAL SEL VERTEBRAL UNI R MOD SED 08/05/2016 Luanne Bras, MD [facility]   IR GENERIC HISTORICAL  08/05/2016   IR ANGIO INTRA EXTRACRAN SEL COM CAROTID INNOMINATE BILAT MOD SED 08/05/2016 Luanne Bras, MD [facility]   IR GENERIC  HISTORICAL  08/05/2016   IR ANGIO VERTEBRAL SEL SUBCLAVIAN INNOMINATE UNI L MOD SED 08/05/2016 Luanne Bras, MD [facility]   MOUTH SURGERY     TONSILLECTOMY  TUBAL LIGATION      FAMILY HISTORY: Family History  Problem Relation Age of Onset   Heart disease Mother    Cancer Mother        lung   Stroke Paternal Grandmother    Stomach cancer Maternal Grandfather     SOCIAL HISTORY: Social History   Socioeconomic History   Marital status: Married    Spouse name: Not on file   Number of children: 3   Years of education: Not on file   Highest education level: Not on file  Occupational History    Comment: Pharmacist, hospital, retired  Tobacco Use   Smoking status: Never   Smokeless tobacco: Never  Vaping Use   Vaping Use: Never used  Substance and Sexual Activity   Alcohol use: Yes    Alcohol/week: 1.0 standard drink    Types: 1 Glasses of wine per week    Comment: 1-2 wine in evening   Drug use: Not Currently   Sexual activity: Not Currently    Comment: Married since 2005  Other Topics Concern   Not on file  Social History Narrative   Lives with husband and son   Right Handed   Drinks 1-2 cups daily.    Social Determinants of Health   Financial Resource Strain: Low Risk    Difficulty of Paying Living Expenses: Not very hard  Food Insecurity: No Food Insecurity   Worried About Charity fundraiser in the Last Year: Never true   Ran Out of Food in the Last Year: Never true  Transportation Needs: No Transportation Needs   Lack of Transportation (Medical): No   Lack of Transportation (Non-Medical): No  Physical Activity: Sufficiently Active   Days of Exercise per Week: 7 days   Minutes of Exercise per Session: 30 min  Stress: No Stress Concern Present   Feeling of Stress : Not at all  Social Connections: Moderately Isolated   Frequency of Communication with Friends and Family: More than three times a week   Frequency of Social Gatherings with Friends and Family:  More than three times a week   Attends Religious Services: Never   Marine scientist or Organizations: No   Attends Archivist Meetings: Never   Marital Status: Married  Human resources officer Violence: Not At Risk   Fear of Current or Ex-Partner: No   Emotionally Abused: No   Physically Abused: No   Sexually Abused: No     PHYSICAL EXAM  Vitals:   06/16/21 1036  BP: (!) 157/86  Pulse: 76  Weight: 138 lb (62.6 kg)  Height: 5\' 6"  (1.676 m)   Body mass index is 22.27 kg/m.  Generalized: Pleasant elderly Caucasian lady in no acute distress  Head: normocephalic and atraumatic,.  Neck: Supple, no carotid bruits  Cardiac: Regular rate rhythm, no murmur  Skin no rash or petechiae Musculoskeletal: No deformity   Neurological examination   Mentation: Alert oriented to time, place, and person. Attention span and concentration appropriate. Recent and remote memory intact.  Follows all commands speech and language fluent.   Cranial nerve II-XII: Funduscopic exam not done .Pupils were equal round reactive to light extraocular movements were full, visual field were full on confrontational test. Facial sensation and strength were normal. hearing was intact to finger rubbing bilaterally. Uvula tongue midline. head turning and shoulder shrug were normal and symmetric.Tongue protrusion into cheek strength was normal. Motor: normal bulk and tone, full strength in the BUE, BLE, diminished fine finger movements  on the left.  Orbits right over left upper extremity. Sensory: normal and symmetric to light touch, pinprick, and  Vibration, in the upper and lower extremities Coordination: finger-nose-finger, heel-to-shin bilaterally, no dysmetria Reflexes: 1+ upper lower and symmetric, plantar responses were flexor bilaterally. Gait and Station: Rising up from seated position without assistance, normal stance,  moderate stride, good arm swing, smooth turning, able to perform tiptoe, and heel  walking without difficulty. Tandem gait unable to perform without difficulty.     DIAGNOSTIC DATA (LABS, IMAGING, TESTING) - I reviewed patient records, labs, notes, testing and imaging myself where available.  Lab Results  Component Value Date   WBC 12.7 (H) 03/19/2021   HGB 11.0 (L) 03/19/2021   HCT 32.7 (L) 03/19/2021   MCV 90.8 03/19/2021   PLT 485 (H) 03/19/2021      Component Value Date/Time   NA 137 03/19/2021 1657   K 4.0 03/19/2021 1657   CL 100 03/19/2021 1657   CO2 27 03/19/2021 1657   GLUCOSE 102 (H) 03/19/2021 1657   BUN 21 03/19/2021 1657   CREATININE 0.96 (H) 03/19/2021 1657   CALCIUM 9.0 03/19/2021 1657   PROT 6.8 03/19/2021 1657   ALBUMIN 3.8 03/10/2021 0025   AST 18 03/19/2021 1657   AST 20 07/29/2020 1234   ALT 22 03/19/2021 1657   ALT 14 07/29/2020 1234   ALKPHOS 41 03/10/2021 0025   BILITOT 0.8 03/19/2021 1657   BILITOT 0.5 07/29/2020 1234   GFRNONAA 59 (L) 03/19/2021 1657   GFRAA 68 03/19/2021 1657   Lab Results  Component Value Date   CHOL 207 (H) 05/25/2020   HDL 89 05/25/2020   LDLCALC 101 (H) 05/25/2020   TRIG 78 05/25/2020   CHOLHDL 2.3 05/25/2020   Lab Results  Component Value Date   HGBA1C 5.3 03/22/2016      ASSESSMENT AND PLAN 81 year Caucasian lady with parenchymal right frontal intracerebral hemorrhage in July 2017 of indeterminate etiology. She had  complaints of left-sided numbness and tingling from the shoulder to the hand, no weakness was involved thought to be partial seizures but has been episode free for more than 4 years now off [place] for a few months which was tapered.   Doing well  PLANI I had a long discussion with the patient regarding her episodes of paresthesias which seem to have resolved and she has tapered and discontinued Keppra now for several months and is doing well.  Recommend she maintain strict control of hypertension with blood pressure goal below 140/90.  Continue ongoing treatment and follow-up for  Lyme's disease with her primary physician.  She will return for follow-up in the future to see me only as needed and no routine schedule appointment was made. Greater than 50% of time during this 25 minute visit was spent on counseling,explanation of diagnosis of simple partial seizure, intracerebral hemorrhage, planning of further management, discussion with patient  and coordination of care for stroke prevention,seizure disorder, discussing seizure precautions, anxiety etc.   Antony Contras, MD  [facility] [address] [place], [place] [address] [phone]

## 2021-06-18 ENCOUNTER — Other Ambulatory Visit: Payer: Self-pay | Admitting: Family Medicine

## 2021-06-18 DIAGNOSIS — F419 Anxiety disorder, unspecified: Secondary | ICD-10-CM | POA: Diagnosis not present

## 2021-06-18 DIAGNOSIS — Z809 Family history of malignant neoplasm, unspecified: Secondary | ICD-10-CM | POA: Diagnosis not present

## 2021-06-18 DIAGNOSIS — F325 Major depressive disorder, single episode, in full remission: Secondary | ICD-10-CM | POA: Diagnosis not present

## 2021-06-18 DIAGNOSIS — Z853 Personal history of malignant neoplasm of breast: Secondary | ICD-10-CM | POA: Diagnosis not present

## 2021-06-18 DIAGNOSIS — Z791 Long term (current) use of non-steroidal anti-inflammatories (NSAID): Secondary | ICD-10-CM | POA: Diagnosis not present

## 2021-06-18 DIAGNOSIS — I10 Essential (primary) hypertension: Secondary | ICD-10-CM | POA: Diagnosis not present

## 2021-06-18 DIAGNOSIS — K219 Gastro-esophageal reflux disease without esophagitis: Secondary | ICD-10-CM | POA: Diagnosis not present

## 2021-06-18 DIAGNOSIS — Z8673 Personal history of transient ischemic attack (TIA), and cerebral infarction without residual deficits: Secondary | ICD-10-CM | POA: Diagnosis not present

## 2021-06-18 DIAGNOSIS — M1990 Unspecified osteoarthritis, unspecified site: Secondary | ICD-10-CM | POA: Diagnosis not present

## 2021-06-30 DIAGNOSIS — L650 Telogen effluvium: Secondary | ICD-10-CM | POA: Diagnosis not present

## 2021-07-07 ENCOUNTER — Encounter: Payer: Self-pay | Admitting: Family Medicine

## 2021-07-07 DIAGNOSIS — Z853 Personal history of malignant neoplasm of breast: Secondary | ICD-10-CM | POA: Diagnosis not present

## 2021-07-11 ENCOUNTER — Other Ambulatory Visit: Payer: Self-pay | Admitting: Family Medicine

## 2021-07-19 DIAGNOSIS — D0512 Intraductal carcinoma in situ of left breast: Secondary | ICD-10-CM | POA: Diagnosis not present

## 2021-08-23 ENCOUNTER — Ambulatory Visit: Payer: Medicare PPO | Admitting: Neurology

## 2021-09-22 ENCOUNTER — Telehealth: Payer: Medicare PPO | Admitting: Nurse Practitioner

## 2021-09-22 ENCOUNTER — Encounter: Payer: Self-pay | Admitting: Nurse Practitioner

## 2021-09-22 ENCOUNTER — Other Ambulatory Visit: Payer: Self-pay

## 2021-09-22 DIAGNOSIS — R062 Wheezing: Secondary | ICD-10-CM | POA: Diagnosis not present

## 2021-09-22 DIAGNOSIS — B9689 Other specified bacterial agents as the cause of diseases classified elsewhere: Secondary | ICD-10-CM | POA: Diagnosis not present

## 2021-09-22 DIAGNOSIS — J329 Chronic sinusitis, unspecified: Secondary | ICD-10-CM

## 2021-09-22 MED ORDER — ALBUTEROL SULFATE HFA 108 (90 BASE) MCG/ACT IN AERS: 2.0000 | INHALATION_SPRAY | Freq: Four times a day (QID) | RESPIRATORY_TRACT | 0 refills | Status: DC | PRN

## 2021-09-22 MED ORDER — DOXYCYCLINE HYCLATE 100 MG PO TABS: 100.0000 mg | ORAL_TABLET | Freq: Two times a day (BID) | ORAL | 0 refills | Status: DC

## 2021-09-22 MED ORDER — PREDNISONE 20 MG PO TABS: 20.0000 mg | ORAL_TABLET | Freq: Every day | ORAL | 0 refills | Status: DC

## 2021-09-22 NOTE — Progress Notes (Signed)
Subjective:    Patient ID: Cynthia [name], female    DOB: [birth year], 76 y.o.   MRN: [id number]  HPI: Cynthia [name] is a 76 y.o. female presenting virtually for cough.  Chief Complaint  Patient presents with   Cough   UPPER RESPIRATORY TRACT INFECTION Onset: 2 weeks ago COVID-19 testing history: no Fever: low grade - 98.2 highest about 1 week Body aches: yes Chills: yes Cough: yes; congested and "heavy" Shortness of breath: yes; with activity Wheezing: yes Chest pain: no Chest tightness: yes; on left side of chest with coughing Chest congestion: yes Nasal congestion: yes; thick, yellow tinge Runny nose: yes Post nasal drip: yes Sneezing: yes Sore throat: yes Swollen glands: no Sinus pressure: yes; around eyes Headache: yes Face pain: no Toothache: yes Ear pain: yes ; left ear more than right Ear pressure: no  Eyes red/itching:yes Eye drainage/crusting: yes  Nausea: yes  Vomiting: no Diarrhea: no  Change in appetite: no  Loss of taste/smell: yes  Rash: no Fatigue: yes Sick contacts: no Strep contacts: no  Context: worse Recurrent sinusitis: no Treatments attempted: Mucinex, throat lozenges, Tylenol Relief with OTC medications: yes  Allergies  Allergen Reactions   Other Anaphylaxis    FIRE ANTS Insect stings / bites   Acyclovir And Related     Unknown reaction   Codeine     Syncope    Penicillins Rash    Has patient had a PCN reaction causing immediate rash, facial/tongue/throat swelling, SOB or lightheadedness with hypotension: Unknown Has patient had a PCN reaction causing severe rash involving mucus membranes or skin necrosis: No Has patient had a PCN reaction that required hospitalization No Has patient had a PCN reaction occurring within the last 10 years: No If all of the above answers are "NO", then may proceed with Cephalosporin use.     Outpatient Encounter Medications as of 09/22/2021  Medication Sig   acetaminophen (TYLENOL)  500 MG tablet Take 1,000 mg by mouth every 6 (six) hours as needed for moderate pain or headache.   alendronate (FOSAMAX) 70 MG tablet TAKE 1 TABLET BY MOUTH EVERY 7 DAYS. TAKE WITH FULL GLASS OF WATER ON AN EMPTY STOMACH   ALPRAZolam (XANAX) 0.5 MG tablet Take 1 tablet (0.5 mg total) by mouth 3 (three) times daily as needed for anxiety.   Calcium Carb-Cholecalciferol (CALCIUM CARBONATE-VITAMIN D3) 600-400 MG-UNIT TABS Take 1 tablet by mouth 2 (two) times daily.   Cetirizine HCl 10 MG TBDP Take 1 tablet by mouth at bedtime. Aller-Tec (from [employer])   EPINEPHRINE 0.3 mg/0.3 mL IJ SOAJ injection USE AS DIRECTED   escitalopram (LEXAPRO) 10 MG tablet Take 1 tablet (10 mg total) by mouth daily.   famotidine (PEPCID) 20 MG tablet Take 1 tablet (20 mg total) by mouth 2 (two) times daily.   ferrous sulfate 325 (65 FE) MG tablet Take 325 mg by mouth daily.   losartan (COZAAR) 100 MG tablet TAKE 1 TABLET BY MOUTH DAILY FOR BLOOD PRESSURE   mometasone (ELOCON) 0.1 % cream Apply topically in the morning and at bedtime.   vitamin B-12 (CYANOCOBALAMIN) 1000 MCG tablet Take 1,000 mcg by mouth daily.   zinc gluconate 50 MG tablet Take 50 mg by mouth daily.   No facility-administered encounter medications on file as of 09/22/2021.    Patient Active Problem List   Diagnosis Date Noted   Ductal carcinoma in situ (DCIS) of left breast 07/23/2020   Partial symptomatic epilepsy with complex partial seizures, not intractable,  without status epilepticus ([place]) 06/08/2017   Numbness and tingling 10/03/2016   Cognitive deficit due to recent stroke    Left hemiparesis ([place]) 03/29/2016   Left-sided visual neglect 03/29/2016   Vascular headache    Benign essential HTN    Hyponatremia    Acute blood loss anemia    TBI (traumatic brain injury)    Intracerebral hemorrhage 03/20/2016   ICH (intracerebral hemorrhage) ([place]) 03/20/2016   Colon polyps    Osteoporosis    HTN (hypertension) 01/08/2013    Past Medical  History:  Diagnosis Date   Breast cancer ([place])    Colon polyps    Depression    history of   Diverticulitis    GERD (gastroesophageal reflux disease)    Hypertension    Osteoporosis    Seizures ([place])    Stroke ([facility])     Relevant past medical, surgical, family and social history reviewed and updated as indicated. Interim medical history since our last visit reviewed.  Review of Systems Per HPI unless specifically indicated above     Objective:    There were no vitals taken for this visit.  Wt Readings from Last 3 Encounters:  06/16/21 138 lb (62.6 kg)  05/27/21 139 lb (63 kg)  04/30/21 136 lb (61.7 kg)    Physical Exam Physical examination unable to be performed due to lack of equipment.  Patient talking in complete sentences during telemedicine visit.     Assessment & Plan:  1. Bacterial sinusitis Acute.  I suspect her symptoms began with a viral infection, possibly COVID-19.  However, her symptoms have been going on more than 2 weeks and are worsening.  Start doxycycline for bacterial sinusitis.  Encouraged Mucinex, nasal rinses, and steam showers to help with congestion.  At this point, since it has been 2 weeks, I do not think there would be benefit in testing her for COVID-19.   - doxycycline (VIBRA-TABS) 100 MG tablet; Take 1 tablet (100 mg total) by mouth 2 (two) times daily.  Dispense: 14 tablet; Refill: 0  2. Wheezing Acute.  Coupled with shortness of breath with activity and chest tightness, I am worried about underlying asthma.  I will start the patient with a rescue inhaler to use every 4-6 hours as needed for wheezing or shortness of breath.  I would also like to evaluate her in the office in the next 24-48 hours.  We discussed starting prednisone for possible reactive airway, risks outweigh benefits of this given significant symptoms.  Plan to evaluate in the next 24-48 hours in person.  If her symptoms worsen in the meantime, she should go to the emergency  room.  - albuterol (VENTOLIN HFA) 108 (90 Base) MCG/ACT inhaler; Inhale 2 puffs into the lungs every 6 (six) hours as needed for wheezing or shortness of breath.  Dispense: 8 g; Refill: 0 - predniSONE (DELTASONE) 20 MG tablet; Take 1 tablet (20 mg total) by mouth daily with breakfast.  Dispense: 5 tablet; Refill: 0    Follow up plan: No follow-ups on file.  This visit was completed via telephone due to the restrictions of the COVID-19 pandemic. All issues as above were discussed and addressed but no physical exam was performed. If it was felt that the patient should be evaluated in the office, they were directed there. The patient verbally consented to this visit. Patient was unable to complete an audio/visual visit due to Technical difficulties.  Text message was sent twice to patient's preferred phone number.  She  did not receive text message. Location of the patient: home Location of the provider: work Those involved with this call:  Provider: Noemi Chapel, DNP, FNP-C CMA: Elizabeth Palau, CMA Front Desk/Registration: Santina Evans  Time spent on call:  16 minutes on the phone discussing health concerns. 21 minutes total spent in review of patient's record and preparation of their chart. I verified patient identity using two factors (patient name and date of birth). Patient consents verbally to being seen via telemedicine visit today.

## 2021-09-23 ENCOUNTER — Telehealth: Payer: Self-pay

## 2021-09-23 ENCOUNTER — Ambulatory Visit: Payer: Medicare PPO | Admitting: Nurse Practitioner

## 2021-09-23 NOTE — Telephone Encounter (Signed)
Spoke with patient to see how she was feeling.  She says she is mentally not feeling well.  In the past she has had altered mental state so patient has been advised to go to urgent care or hospital.

## 2021-09-27 ENCOUNTER — Ambulatory Visit: Payer: Medicare PPO | Admitting: Family Medicine

## 2021-09-27 ENCOUNTER — Encounter: Payer: Self-pay | Admitting: Family Medicine

## 2021-09-27 ENCOUNTER — Other Ambulatory Visit: Payer: Self-pay

## 2021-09-27 VITALS — BP 128/82 | HR 103 | Temp 97.5°F | Resp 18 | Ht 66.0 in | Wt 138.0 lb

## 2021-09-27 DIAGNOSIS — R051 Acute cough: Secondary | ICD-10-CM

## 2021-09-27 NOTE — Progress Notes (Signed)
Subjective:    Patient ID: Cynthia [name], female    DOB: 1945-11-19, 76 y.o.   MRN: [id number]  Symptoms began January 6 with head congestion and rhinorrhea and cough.  She has been unable to "shake the cough.  She reports some mild shortness of breath with activity, chest congestion, cough productive of yellow sputum.  She was extremely fatigued and tired last week.  She saw my partner through a virtual video visit and was started on prednisone, albuterol, doxycycline.  She is here today for follow-up.  She states that she is feeling better although not back to her baseline.  I performed a COVID test today in the office which was negative.  She denies any residual fevers over the weekend.  She continues to have productive cough and chest congestion and fatigue.  On examination she has crackles appreciated in the left upper lobe anteriorly.  Otherwise her lungs are clear to auscultation bilaterally.  Past Medical History:  Diagnosis Date   Breast cancer ([place])    Colon polyps    Depression    history of   Diverticulitis    GERD (gastroesophageal reflux disease)    Hypertension    Osteoporosis    Seizures ([place])    Stroke [facility])    Past Surgical History:  Procedure Laterality Date   BREAST LUMPECTOMY WITH RADIOACTIVE SEED LOCALIZATION Left 08/28/2020   Procedure: LEFT BREAST LUMPECTOMY WITH RADIOACTIVE SEED LOCALIZATION;  Surgeon: Stark Klein, MD;  Location: [place];  Service: General;  Laterality: Left;  RNFA   IR GENERIC HISTORICAL  08/05/2016   IR ANGIO VERTEBRAL SEL VERTEBRAL UNI R MOD SED 08/05/2016 Luanne Bras, MD [facility]   IR GENERIC HISTORICAL  08/05/2016   IR ANGIO INTRA EXTRACRAN SEL COM CAROTID INNOMINATE BILAT MOD SED 08/05/2016 Luanne Bras, MD [facility]   IR GENERIC HISTORICAL  08/05/2016   IR ANGIO VERTEBRAL SEL SUBCLAVIAN INNOMINATE UNI L MOD SED 08/05/2016 Luanne Bras, MD [facility]   MOUTH SURGERY     TONSILLECTOMY     TUBAL LIGATION      Current Outpatient Medications on File Prior to Visit  Medication Sig Dispense Refill   acetaminophen (TYLENOL) 500 MG tablet Take 1,000 mg by mouth every 6 (six) hours as needed for moderate pain or headache.     albuterol (VENTOLIN HFA) 108 (90 Base) MCG/ACT inhaler Inhale 2 puffs into the lungs every 6 (six) hours as needed for wheezing or shortness of breath. 8 g 0   alendronate (FOSAMAX) 70 MG tablet TAKE 1 TABLET BY MOUTH EVERY 7 DAYS. TAKE WITH FULL GLASS OF WATER ON AN EMPTY STOMACH 12 tablet 3   ALPRAZolam (XANAX) 0.5 MG tablet Take 1 tablet (0.5 mg total) by mouth 3 (three) times daily as needed for anxiety. 45 tablet 2   Calcium Carb-Cholecalciferol (CALCIUM CARBONATE-VITAMIN D3) 600-400 MG-UNIT TABS Take 1 tablet by mouth 2 (two) times daily.     Cetirizine HCl 10 MG TBDP Take 1 tablet by mouth at bedtime. Aller-Tec (from [employer])     doxycycline (VIBRA-TABS) 100 MG tablet Take 1 tablet (100 mg total) by mouth 2 (two) times daily. 14 tablet 0   EPINEPHRINE 0.3 mg/0.3 mL IJ SOAJ injection USE AS DIRECTED 2 each 1   escitalopram (LEXAPRO) 10 MG tablet Take 1 tablet (10 mg total) by mouth daily. 90 tablet 3   famotidine (PEPCID) 20 MG tablet Take 1 tablet (20 mg total) by mouth 2 (two) times daily. 180 tablet 2  ferrous sulfate 325 (65 FE) MG tablet Take 325 mg by mouth daily.     losartan (COZAAR) 100 MG tablet TAKE 1 TABLET BY MOUTH DAILY FOR BLOOD PRESSURE 90 tablet 2   mometasone (ELOCON) 0.1 % cream Apply topically in the morning and at bedtime. 15 g 1   predniSONE (DELTASONE) 20 MG tablet Take 1 tablet (20 mg total) by mouth daily with breakfast. 5 tablet 0   vitamin B-12 (CYANOCOBALAMIN) 1000 MCG tablet Take 1,000 mcg by mouth daily.     zinc gluconate 50 MG tablet Take 50 mg by mouth daily.     No current facility-administered medications on file prior to visit.   Allergies  Allergen Reactions   Other Anaphylaxis    FIRE ANTS Insect stings / bites   Acyclovir And  Related     Unknown reaction   Codeine     Syncope    Penicillins Rash    Has patient had a PCN reaction causing immediate rash, facial/tongue/throat swelling, SOB or lightheadedness with hypotension: Unknown Has patient had a PCN reaction causing severe rash involving mucus membranes or skin necrosis: No Has patient had a PCN reaction that required hospitalization No Has patient had a PCN reaction occurring within the last 10 years: No If all of the above answers are "NO", then may proceed with Cephalosporin use.    Social History   Socioeconomic History   Marital status: Married    Spouse name: Not on file   Number of children: 3   Years of education: Not on file   Highest education level: Not on file  Occupational History    Comment: teacher, retired  Tobacco Use   Smoking status: Never   Smokeless tobacco: Never  Vaping Use   Vaping Use: Never used  Substance and Sexual Activity   Alcohol use: Yes    Alcohol/week: 1.0 standard drink    Types: 1 Glasses of wine per week    Comment: 1-2 wine in evening   Drug use: Not Currently   Sexual activity: Not Currently    Comment: Married since 2005  Other Topics Concern   Not on file  Social History Narrative   Lives with husband and son   Right Handed   Drinks 1-2 cups daily.    Social Determinants of Health   Financial Resource Strain: Low Risk    Difficulty of Paying Living Expenses: Not very hard  Food Insecurity: No Food Insecurity   Worried About Charity fundraiser in the Last Year: Never true   Ran Out of Food in the Last Year: Never true  Transportation Needs: No Transportation Needs   Lack of Transportation (Medical): No   Lack of Transportation (Non-Medical): No  Physical Activity: Sufficiently Active   Days of Exercise per Week: 7 days   Minutes of Exercise per Session: 30 min  Stress: No Stress Concern Present   Feeling of Stress : Not at all  Social Connections: Moderately Isolated   Frequency of  Communication with Friends and Family: More than three times a week   Frequency of Social Gatherings with Friends and Family: More than three times a week   Attends Religious Services: Never   Marine scientist or Organizations: No   Attends Archivist Meetings: Never   Marital Status: Married  Human resources officer Violence: Not At Risk   Fear of Current or Ex-Partner: No   Emotionally Abused: No   Physically Abused: No   Sexually Abused:  No     Review of Systems  All other systems reviewed and are negative.     Objective:   Physical Exam Vitals reviewed.  HENT:     Right Ear: Tympanic membrane and ear canal normal.     Left Ear: Tympanic membrane and ear canal normal.  Cardiovascular:     Rate and Rhythm: Normal rate and regular rhythm.     Heart sounds: Normal heart sounds.  Pulmonary:     Effort: Pulmonary effort is normal. No respiratory distress.     Breath sounds: Rales present. No wheezing.  Chest:    Abdominal:     General: Bowel sounds are normal.     Palpations: Abdomen is soft.  Neurological:     Mental Status: She is alert and oriented to person, place, and time.     Cranial Nerves: No cranial nerve deficit.     Motor: No abnormal muscle tone.     Coordination: Coordination normal.     Deep Tendon Reflexes: Reflexes are normal and symmetric.  Psychiatric:        Behavior: Behavior normal.        Thought Content: Thought content normal.        Judgment: Judgment normal.          Assessment & Plan:   Acute cough - Plan: DG Chest 2 View Patient is clinically improving albeit slowly on the doxycycline, prednisone, and albuterol.  Given abnormal breath sounds, I will obtain a chest x-ray to evaluate for any evidence of community-acquired pneumonia.  COVID test today was negative.  If chest x-ray is negative, I will encourage the patient to complete her current medication and allow tincture of time with rest.  Await the results of the x-ray

## 2021-09-28 ENCOUNTER — Ambulatory Visit
Admission: RE | Admit: 2021-09-28 | Discharge: 2021-09-28 | Disposition: A | Discharge status: Home / Self Care | Payer: Medicare PPO | Source: Ambulatory Visit | Attending: Family Medicine | Admitting: Family Medicine

## 2021-09-28 ENCOUNTER — Telehealth: Payer: Self-pay

## 2021-09-28 DIAGNOSIS — R051 Acute cough: Secondary | ICD-10-CM

## 2021-09-28 DIAGNOSIS — R059 Cough, unspecified: Secondary | ICD-10-CM | POA: Diagnosis not present

## 2021-09-28 NOTE — Telephone Encounter (Signed)
Advised patient we will contact her once the results are back.

## 2021-09-28 NOTE — Telephone Encounter (Signed)
Pt called in stating that she went to get xray of chest done this morning. Pt states that pcp told her to come back to office after getting this done. Please advise  Cb#: [phone]

## 2021-09-28 NOTE — Telephone Encounter (Signed)
Patient informed of results.  

## 2021-10-26 ENCOUNTER — Encounter (HOSPITAL_COMMUNITY): Payer: Self-pay

## 2021-10-28 DIAGNOSIS — H40013 Open angle with borderline findings, low risk, bilateral: Secondary | ICD-10-CM | POA: Diagnosis not present

## 2021-11-18 ENCOUNTER — Encounter: Payer: Self-pay | Admitting: Family Medicine

## 2021-11-18 ENCOUNTER — Ambulatory Visit: Payer: Medicare PPO | Admitting: Family Medicine

## 2021-11-18 ENCOUNTER — Other Ambulatory Visit: Payer: Self-pay

## 2021-11-18 VITALS — BP 122/82 | HR 65 | Temp 97.2°F | Resp 18 | Ht 66.0 in | Wt 138.0 lb

## 2021-11-18 DIAGNOSIS — F321 Major depressive disorder, single episode, moderate: Secondary | ICD-10-CM

## 2021-11-18 DIAGNOSIS — R413 Other amnesia: Secondary | ICD-10-CM | POA: Diagnosis not present

## 2021-11-18 MED ORDER — VENLAFAXINE HCL ER 75 MG PO CP24: 75.0000 mg | ORAL_CAPSULE | Freq: Every day | ORAL | 2 refills | Status: DC
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
## 2021-12-10 ENCOUNTER — Other Ambulatory Visit: Payer: Self-pay | Admitting: Family Medicine

## 2021-12-16 ENCOUNTER — Ambulatory Visit: Payer: Medicare PPO | Admitting: Family Medicine

## 2021-12-16 VITALS — BP 130/70 | HR 94 | Temp 96.6°F | Ht 66.0 in | Wt 133.8 lb

## 2021-12-16 DIAGNOSIS — F321 Major depressive disorder, single episode, moderate: Secondary | ICD-10-CM | POA: Diagnosis not present

## 2021-12-16 DIAGNOSIS — R413 Other amnesia: Secondary | ICD-10-CM
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
## 2021-12-22 ENCOUNTER — Encounter: Payer: Self-pay | Admitting: Psychology

## 2022-01-10 ENCOUNTER — Emergency Department (HOSPITAL_COMMUNITY): Payer: Medicare PPO

## 2022-01-10 ENCOUNTER — Other Ambulatory Visit: Payer: Self-pay

## 2022-01-10 ENCOUNTER — Inpatient Hospital Stay (HOSPITAL_COMMUNITY)
Admission: EM | Admit: 2022-01-10 | Discharge: 2022-01-19 | DRG: 065 | Disposition: A | Discharge status: Rehabilitation Facility | Payer: Medicare PPO | Attending: Family Medicine | Admitting: Family Medicine

## 2022-01-10 ENCOUNTER — Inpatient Hospital Stay (HOSPITAL_COMMUNITY): Payer: Medicare PPO

## 2022-01-10 DIAGNOSIS — I611 Nontraumatic intracerebral hemorrhage in hemisphere, cortical: Principal | ICD-10-CM | POA: Diagnosis present

## 2022-01-10 DIAGNOSIS — I159 Secondary hypertension, unspecified: Secondary | ICD-10-CM | POA: Diagnosis not present

## 2022-01-10 DIAGNOSIS — Z7983 Long term (current) use of bisphosphonates: Secondary | ICD-10-CM | POA: Diagnosis not present

## 2022-01-10 DIAGNOSIS — E876 Hypokalemia: Secondary | ICD-10-CM

## 2022-01-10 DIAGNOSIS — S199XXA Unspecified injury of neck, initial encounter: Secondary | ICD-10-CM | POA: Diagnosis not present

## 2022-01-10 DIAGNOSIS — F32A Depression, unspecified: Secondary | ICD-10-CM | POA: Diagnosis not present

## 2022-01-10 DIAGNOSIS — W06XXXA Fall from bed, initial encounter: Secondary | ICD-10-CM | POA: Diagnosis present

## 2022-01-10 DIAGNOSIS — R569 Unspecified convulsions: Secondary | ICD-10-CM

## 2022-01-10 DIAGNOSIS — Z634 Disappearance and death of family member: Secondary | ICD-10-CM | POA: Diagnosis not present

## 2022-01-10 DIAGNOSIS — R197 Diarrhea, unspecified: Secondary | ICD-10-CM | POA: Diagnosis not present

## 2022-01-10 DIAGNOSIS — S0083XA Contusion of other part of head, initial encounter: Secondary | ICD-10-CM | POA: Diagnosis not present

## 2022-01-10 DIAGNOSIS — Z87898 Personal history of other specified conditions: Secondary | ICD-10-CM | POA: Diagnosis not present

## 2022-01-10 DIAGNOSIS — Z91038 Other insect allergy status: Secondary | ICD-10-CM | POA: Diagnosis not present

## 2022-01-10 DIAGNOSIS — I10 Essential (primary) hypertension: Secondary | ICD-10-CM | POA: Diagnosis not present

## 2022-01-10 DIAGNOSIS — Z885 Allergy status to narcotic agent status: Secondary | ICD-10-CM

## 2022-01-10 DIAGNOSIS — I629 Nontraumatic intracranial hemorrhage, unspecified: Secondary | ICD-10-CM | POA: Diagnosis not present

## 2022-01-10 DIAGNOSIS — M810 Age-related osteoporosis without current pathological fracture: Secondary | ICD-10-CM | POA: Diagnosis present

## 2022-01-10 DIAGNOSIS — Z8673 Personal history of transient ischemic attack (TIA), and cerebral infarction without residual deficits: Secondary | ICD-10-CM

## 2022-01-10 DIAGNOSIS — Z880 Allergy status to penicillin: Secondary | ICD-10-CM | POA: Diagnosis not present

## 2022-01-10 DIAGNOSIS — N3000 Acute cystitis without hematuria: Secondary | ICD-10-CM | POA: Diagnosis not present

## 2022-01-10 DIAGNOSIS — E854 Organ-limited amyloidosis: Secondary | ICD-10-CM | POA: Diagnosis not present

## 2022-01-10 DIAGNOSIS — Z823 Family history of stroke: Secondary | ICD-10-CM

## 2022-01-10 DIAGNOSIS — Z888 Allergy status to other drugs, medicaments and biological substances status: Secondary | ICD-10-CM

## 2022-01-10 DIAGNOSIS — I680 Cerebral amyloid angiopathy: Secondary | ICD-10-CM | POA: Diagnosis present

## 2022-01-10 DIAGNOSIS — Z79899 Other long term (current) drug therapy: Secondary | ICD-10-CM | POA: Diagnosis not present

## 2022-01-10 DIAGNOSIS — E785 Hyperlipidemia, unspecified: Secondary | ICD-10-CM

## 2022-01-10 DIAGNOSIS — R531 Weakness: Secondary | ICD-10-CM | POA: Diagnosis not present

## 2022-01-10 DIAGNOSIS — M79603 Pain in arm, unspecified: Secondary | ICD-10-CM | POA: Diagnosis not present

## 2022-01-10 DIAGNOSIS — Y92003 Bedroom of unspecified non-institutional (private) residence as the place of occurrence of the external cause: Secondary | ICD-10-CM

## 2022-01-10 DIAGNOSIS — G40909 Epilepsy, unspecified, not intractable, without status epilepticus: Secondary | ICD-10-CM | POA: Diagnosis present

## 2022-01-10 DIAGNOSIS — Z86010 Personal history of colonic polyps: Secondary | ICD-10-CM

## 2022-01-10 DIAGNOSIS — R29898 Other symptoms and signs involving the musculoskeletal system: Secondary | ICD-10-CM | POA: Diagnosis not present

## 2022-01-10 DIAGNOSIS — W19XXXA Unspecified fall, initial encounter: Secondary | ICD-10-CM | POA: Diagnosis not present

## 2022-01-10 DIAGNOSIS — I619 Nontraumatic intracerebral hemorrhage, unspecified: Secondary | ICD-10-CM | POA: Diagnosis present

## 2022-01-10 DIAGNOSIS — F0630 Mood disorder due to known physiological condition, unspecified: Secondary | ICD-10-CM | POA: Diagnosis not present

## 2022-01-10 DIAGNOSIS — R252 Cramp and spasm: Secondary | ICD-10-CM | POA: Diagnosis not present

## 2022-01-10 DIAGNOSIS — I69114 Frontal lobe and executive function deficit following nontraumatic intracerebral hemorrhage: Secondary | ICD-10-CM | POA: Diagnosis not present

## 2022-01-10 DIAGNOSIS — Z8669 Personal history of other diseases of the nervous system and sense organs: Secondary | ICD-10-CM | POA: Diagnosis not present

## 2022-01-10 DIAGNOSIS — Z8249 Family history of ischemic heart disease and other diseases of the circulatory system: Secondary | ICD-10-CM

## 2022-01-10 DIAGNOSIS — I69254 Hemiplegia and hemiparesis following other nontraumatic intracranial hemorrhage affecting left non-dominant side: Secondary | ICD-10-CM | POA: Diagnosis not present

## 2022-01-10 DIAGNOSIS — I6389 Other cerebral infarction: Secondary | ICD-10-CM | POA: Diagnosis not present

## 2022-01-10 DIAGNOSIS — K5900 Constipation, unspecified: Secondary | ICD-10-CM | POA: Diagnosis not present

## 2022-01-10 DIAGNOSIS — I161 Hypertensive emergency: Secondary | ICD-10-CM | POA: Diagnosis not present

## 2022-01-10 DIAGNOSIS — Z853 Personal history of malignant neoplasm of breast: Secondary | ICD-10-CM | POA: Diagnosis not present

## 2022-01-10 DIAGNOSIS — R630 Anorexia: Secondary | ICD-10-CM | POA: Diagnosis not present

## 2022-01-10 DIAGNOSIS — I471 Supraventricular tachycardia: Secondary | ICD-10-CM | POA: Diagnosis present

## 2022-01-10 DIAGNOSIS — N390 Urinary tract infection, site not specified: Secondary | ICD-10-CM | POA: Diagnosis not present

## 2022-01-10 DIAGNOSIS — G319 Degenerative disease of nervous system, unspecified: Secondary | ICD-10-CM | POA: Diagnosis not present

## 2022-01-10 DIAGNOSIS — K219 Gastro-esophageal reflux disease without esophagitis: Secondary | ICD-10-CM | POA: Diagnosis present

## 2022-01-10 DIAGNOSIS — Z8679 Personal history of other diseases of the circulatory system: Secondary | ICD-10-CM | POA: Diagnosis not present

## 2022-01-10 DIAGNOSIS — Z800 Family history of malignant neoplasm of digestive organs: Secondary | ICD-10-CM | POA: Diagnosis not present

## 2022-01-10 DIAGNOSIS — R414 Neurologic neglect syndrome: Secondary | ICD-10-CM | POA: Diagnosis not present

## 2022-01-10 LAB — CBC
HCT: 38.3 % (ref 36.0–46.0)
Hemoglobin: 13.2 g/dL (ref 12.0–15.0)
MCH: 31.1 pg (ref 26.0–34.0)
MCHC: 34.5 g/dL (ref 30.0–36.0)
MCV: 90.1 fL (ref 80.0–100.0)
Platelets: 256 10*3/uL (ref 150–400)
RBC: 4.25 MIL/uL (ref 3.87–5.11)
RDW: 12.7 % (ref 11.5–15.5)
WBC: 9.1 10*3/uL (ref 4.0–10.5)
nRBC: 0 % (ref 0.0–0.2)

## 2022-01-10 LAB — COMPREHENSIVE METABOLIC PANEL
ALT: 20 U/L (ref 0–44)
AST: 33 U/L (ref 15–41)
Albumin: 3.8 g/dL (ref 3.5–5.0)
Alkaline Phosphatase: 57 U/L (ref 38–126)
Anion gap: 7 (ref 5–15)
BUN: 17 mg/dL (ref 8–23)
CO2: 27 mmol/L (ref 22–32)
Calcium: 9.2 mg/dL (ref 8.9–10.3)
Chloride: 107 mmol/L (ref 98–111)
Creatinine, Ser: 0.7 mg/dL (ref 0.44–1.00)
GFR, Estimated: >60 mL/min (ref >60)
Glucose, Bld: 108 mg/dL — ABNORMAL HIGH (ref 70–99)
Potassium: 4 mmol/L (ref 3.5–5.1)
Sodium: 141 mmol/L (ref 135–145)
Total Bilirubin: 0.9 mg/dL (ref 0.3–1.2)
Total Protein: 6.3 g/dL — ABNORMAL LOW (ref 6.5–8.1)

## 2022-01-10 LAB — DIFFERENTIAL
Abs Immature Granulocytes: 0.03 10*3/uL (ref 0.00–0.07)
Basophils Absolute: 0 10*3/uL (ref 0.0–0.1)
Basophils Relative: 0 %
Eosinophils Absolute: 0 10*3/uL (ref 0.0–0.5)
Eosinophils Relative: 0 %
Immature Granulocytes: 0 %
Lymphocytes Relative: 12 %
Lymphs Abs: 1.1 10*3/uL (ref 0.7–4.0)
Monocytes Absolute: 0.7 10*3/uL (ref 0.1–1.0)
Monocytes Relative: 7 %
Neutro Abs: 7.2 10*3/uL (ref 1.7–7.7)
Neutrophils Relative %: 81 %

## 2022-01-10 LAB — APTT: aPTT: 32 seconds (ref 24–36)

## 2022-01-10 LAB — ECHOCARDIOGRAM COMPLETE
AR max vel: 1.85 cm2
AV Area VTI: 1.8 cm2
AV Area mean vel: 1.9 cm2
AV Mean grad: 3 mmHg
AV Peak grad: 6 mmHg
Ao pk vel: 1.22 m/s
Area-P 1/2: 3.42 cm2
Calc EF: 71.6 %
S' Lateral: 2.8 cm
Single Plane A2C EF: 78.2 %
Single Plane A4C EF: 62.1 %
Weight: 2229.29 oz

## 2022-01-10 LAB — PROTIME-INR
INR: 1 (ref 0.8–1.2)
Prothrombin Time: 12.9 seconds (ref 11.4–15.2)

## 2022-01-10 LAB — CBG MONITORING, ED: Glucose-Capillary: 95 mg/dL (ref 70–99)

## 2022-01-10 LAB — SURGICAL PCR SCREEN
MRSA, PCR: NEGATIVE
Staphylococcus aureus: NEGATIVE

## 2022-01-10 MED ORDER — CLEVIDIPINE BUTYRATE 0.5 MG/ML IV EMUL
0.0000 mg/h | INTRAVENOUS | Status: DC
  Administered 2022-01-10: 1 mg/h via INTRAVENOUS
  Administered 2022-01-10: 2 mg/h via INTRAVENOUS
  Filled 2022-01-10: qty 50

## 2022-01-10 MED ORDER — SODIUM CHLORIDE 0.9% FLUSH
3.0000 mL | Freq: Once | INTRAVENOUS | Status: AC
  Administered 2022-01-10: 3 mL via INTRAVENOUS

## 2022-01-10 MED ORDER — PANTOPRAZOLE SODIUM 40 MG IV SOLR: 40.0000 mg | Freq: Every day | INTRAVENOUS | Status: DC

## 2022-01-10 MED ORDER — STROKE: EARLY STAGES OF RECOVERY BOOK
Freq: Once | Status: AC
  Filled 2022-01-10: qty 1

## 2022-01-10 MED ORDER — ACETAMINOPHEN 650 MG RE SUPP: 650.0000 mg | RECTAL | Status: DC | PRN

## 2022-01-10 MED ORDER — ACETAMINOPHEN 160 MG/5ML PO SOLN: 650.0000 mg | ORAL | Status: DC | PRN

## 2022-01-10 MED ORDER — PANTOPRAZOLE SODIUM 40 MG PO TBEC
40.0000 mg | DELAYED_RELEASE_TABLET | Freq: Every day | ORAL | Status: DC
Start: 2022-01-10 — End: 2022-01-19
  Administered 2022-01-10 – 2022-01-18 (×9): 40 mg via ORAL
  Filled 2022-01-10 (×9): qty 1

## 2022-01-10 MED ORDER — DIAZEPAM 5 MG PO TABS
5.0000 mg | ORAL_TABLET | Freq: Once | ORAL | Status: AC | PRN
  Administered 2022-01-10: 5 mg via ORAL
  Filled 2022-01-10: qty 1

## 2022-01-10 MED ORDER — SENNOSIDES-DOCUSATE SODIUM 8.6-50 MG PO TABS
1.0000 | ORAL_TABLET | Freq: Two times a day (BID) | ORAL | Status: DC
  Administered 2022-01-10 – 2022-01-14 (×7): 1 via ORAL
  Filled 2022-01-10 (×7): qty 1

## 2022-01-10 MED ORDER — ACETAMINOPHEN 325 MG PO TABS
650.0000 mg | ORAL_TABLET | ORAL | Status: DC | PRN
  Administered 2022-01-10 – 2022-01-16 (×3): 650 mg via ORAL
  Filled 2022-01-10 (×3): qty 2

## 2022-01-10 MED ORDER — LABETALOL HCL 5 MG/ML IV SOLN
10.0000 mg | Freq: Once | INTRAVENOUS | Status: AC
  Administered 2022-01-10: 10 mg via INTRAVENOUS

## 2022-01-10 MED ORDER — PERFLUTREN LIPID MICROSPHERE
1.0000 mL | INTRAVENOUS | Status: AC | PRN
  Administered 2022-01-10: 2 mL via INTRAVENOUS
  Filled 2022-01-10: qty 10

## 2022-01-10 NOTE — H&P (Addendum)
Neurology H&P ? ?CC: Code stroke  ? ?History is obtained from:patient and medical record  ? ?HPI: Cynthia [name] is a 76 y.o. female with past medical history of HTN, [place] 2017, seizures, GERD, breast cancer and depression who presents [facility] the ED via [facility] for acute onset of left side weakness. LKW 0030, she awoke @ 0300 and had left side weakness and she slid out of bed. She attempted to stand hitting the back of her head on the night stand, that has bruising and swelling to left eye. She then crawled into the hallway to alert her son who found her at 1000am. Code stroke called by EMS. CTH with acute ICH. ? ? ?LKW: 0030 ?tpa given?: no, ICH  ?ICH Score: 0 ?Modified Rankin Scale: 1-No significant post stroke disability and can perform usual duties with stroke symptoms ? ?ROS: A 14 point ROS was performed and is negative except as noted in the HPI.   ? ?Past Medical History:  ?Diagnosis Date  ? Breast cancer ([place])   ? Colon polyps   ? Depression   ? history of  ? Diverticulitis   ? GERD (gastroesophageal reflux disease)   ? Hypertension   ? Osteoporosis   ? Seizures ([place])   ? Stroke [facility])   ? ? ? ?Family History  ?Problem Relation Age of Onset  ? Heart disease Mother   ? Cancer Mother   ?     lung  ? Stroke Paternal Grandmother   ? Stomach cancer Maternal Grandfather   ? ? ? ?Social History:  reports that she has never smoked. She has never used smokeless tobacco. She reports current alcohol use of about 1.0 standard drink per week. She reports that she does not currently use drugs. ? ? ?Exam: ?Current vital signs: ?Wt 63.2 kg   BMI 22.49 kg/m?  ?Vital signs in last 24 hours: ?Weight:  [63.2 kg] 63.2 kg (05/01 1100) ? ?Physical Exam  ?Constitutional: Appears well-developed and well-nourished.  ?Psych: Affect appropriate to situation ?Eyes: No scleral injection ?HENT: bruising and swelling over left eye  ?Head: Normocephalic.  ?Cardiovascular: Normal rate and regular rhythm.  ?Respiratory: Effort normal and breath  sounds normal to anterior ascultation ?GI: Soft.  No distension. There is no tenderness.  ?Skin: WDI ? ?Neuro: ?Mental Status: ?Patient is awake, alert, oriented to person, place, month, year, and situation. ?Patient is able to give a clear and coherent history. ?No signs of aphasia or neglect ?Cranial Nerves: ?II: Visual Fields are full. Pupils are equal, round, and reactive to light.   ?III,IV, VI: EOMI without ptosis or diploplia.  ?V: Facial sensation is symmetric to temperature ?VII: Facial movement is symmetric.  ?VIII: hearing is intact to voice ?X: Uvula elevates symmetrically ?XI: Shoulder shrug is symmetric. ?XII: tongue is midline without atrophy or fasciculations.  ?Motor: ?Tone is normal. Bulk is normal. Left arm 4/5, left leg 4/5, right arm 5/5 and right leg 5/5  ?Sensory: ?Sensation is symmetric to light touch and temperature in the arms and legs. ?Cerebellar: ?FNF and HKS are intact bilaterally ? ?NIHSS: ?1a Level of Conscious.: 0 ?1b LOC Questions: 0 ?1c LOC Commands: 0 ?2 Best Gaze: 0 ?3 Visual: 0 ?4 Facial Palsy: 0 ?5a Motor Arm - left: 0 ?5b Motor Arm - Right: 0 ?6a Motor Leg - Left: 3 ?6b Motor Leg - Right: 0 ?7 Limb Ataxia: 0 ?8 Sensory: 0 ?9 Best Language: 0 ?10 Dysarthria: 0 ?11 Extinct. and Inatten.: 0 ?TOTAL: 3 ?  ?I  have reviewed labs in epic and the results pertinent to this consultation are: ?Pending  ? ?I have reviewed the images obtained: ?Code stroke CT head 5/1: acute ICH  ? ?Primary Diagnosis:  ?Acute non traumatic left ICH  ?Hypertensive Emergency  ? ?Secondary Diagnosis: ?Essential (primary) hypertension ?GERD  ?Depression  ? ?Recommendations: ?- Admit to Neuro ICU  ?- goal SBP 130-150 ?- Cleviprex to maintain goal  ?- labetalol '10mg'$  IV Q15 min PRN  ?- EKG  ?- Bedside swallow eval. If passes can start diet  ?- SCD's  ?- HgbA1c, fasting lipid panel ?- MRI of the brain without contrast ?- Frequent neuro checks ?- Echocardiogram ?- CTA head and neck ?- Risk factor modification ?-  Telemetry monitoring ?- PT consult, OT consult, Speech consult ?- Stroke team to follow  ? ?Beulah Gandy DNP, ACNPC-AG  ? ?ATTENDING NOTE: ?I reviewed above note and agree with the assessment and plan. Pt was seen and examined.  ? ?76 yo F with Hx of right frontal ICH 2017, partial Sz off keprra, depression (husband passed away in 2021-10-13), memory issue for about a year presented to ED for right sided weakness since wake up this am 3:30am. Pt stated that she slept at 12:30am at normal baseline, woke up at 3:30am left sided weakness and she fell from bed. It was getting worse throughout the day and she called EMS this am.  ? ?Per son at the bedside later, she has some memory issue since one year ago, can not remember where she put things, or appointments etc. 02/2022 had confusion episode and was seen at [facility].  ? ?On exam, she is AAO x 3, no aphasia, follows all simple commands, able to name and repeat. No gaze palsy, tracking bilaterally, visual field full, PERRL. No facial droop. Tongue midline. Bilateral UEs 5/5, no drift. RLE 5/5, no drift, LLE 3-/5 proximal. Sensation symmetrical bilaterally, b/l FTN intact, gait not tested.  ? ?CT showed recurrent left frontal ICH, posterior to the [place] in 2017. Etiology concerning for CAA. Will do MRI and MRA head in the evening to evaluate CAA and rule out AVM and to document stablization of hematoma. Continue other routine stroek work up. BP control goal 130-150, cleviprex PRN. Passed swallow, resume home meds.  ? ?For detailed assessment and plan, please refer to above as I have made changes wherever appropriate.  ? ?Cynthia Hawking, MD PhD ?Stroke Neurology ?01/10/2022 ?3:30 PM ? ?This patient is critically ill due to right frontal ICH, hypertensive emergency and at significant risk of neurological worsening, death form hematoma expansion, brain herniation, seizure. This patient's care requires constant monitoring of vital signs, hemodynamics, respiratory and cardiac  monitoring, review of multiple databases, neurological assessment, discussion with family, other specialists and medical decision making of high complexity. I spent 40 minutes of neurocritical care time in the care of this patient. I had long discussion with pt and son at bedside, updated pt current condition, treatment plan and potential prognosis, and answered all the questions. They expressed understanding and appreciation.  ? ? ? ? ?

## 2022-01-10 NOTE — Plan of Care (Signed)
?  Problem: Education: ?Goal: Knowledge of disease or condition will improve ?Outcome: Progressing ?Goal: Knowledge of secondary prevention will improve (SELECT ALL) ?Outcome: Progressing ?Goal: Knowledge of patient specific risk factors will improve (INDIVIDUALIZE FOR PATIENT) ?Outcome: Progressing ?Goal: Individualized Educational Video(s) ?Outcome: Progressing ?  ?Problem: Coping: ?Goal: Will verbalize positive feelings about self ?Outcome: Progressing ?  ?Problem: Health Behavior/Discharge Planning: ?Goal: Ability to manage health-related needs will improve ?Outcome: Progressing ?  ?Problem: Intracerebral Hemorrhage Tissue Perfusion: ?Goal: Complications of Intracerebral Hemorrhage will be minimized ?Outcome: Progressing ?  ?

## 2022-01-10 NOTE — ED Triage Notes (Signed)
Patient brought in via ems from home. Patient went to bed around 0000 this morning. Woke up at 0300 to use restroom when she slid out of bed and could not feel left side to stand. Patient was able to stand once and fell and hit head on chair. Bruising noted to left side of eye. Hx of hemorraghic stroke  ?

## 2022-01-10 NOTE — Code Documentation (Signed)
Stroke Response Nurse Documentation ?Code Documentation ? ?Breeonna Mone is a 76 y.o. female arriving to [facility]  via [facility] on 01/10/2022 with past medical hx of ICH, HTN, Seizures, Breast CA. On No antithrombotic. Code stroke was activated by EMS.  ? ?Patient from home where she was 01/10/2022 at [place] and now complaining of left sided weakness . Woke up at 3am, slid down to floor from bed. Tried to stand up, fell again and hit head on chair. Crawled out to hallway and called for son until he found her around 10am. Son lives with patient. Husband died in 10-02-22.  ? ?Stroke team at the bedside on patient arrival. Labs drawn and patient cleared for CT by Dr. Erlinda Hong. Patient to CT with team. NIHSS 3, see documentation for details and code stroke times. Patient with left leg weakness on exam. The following imaging was completed:  CT Head. Patient is not a candidate for IV Thrombolytic due to previous ICH. Patient is not not a candidate for IR due to LVO negative.  ? ?Care Plan: Q1H VS, Pupils and NIH. BP goal 130-150. ICU Admission.  ? ?Bedside handoff with ED RN [facility].   ? ?Fulton Reek  ?Stroke Response RN ?  ?

## 2022-01-10 NOTE — Progress Notes (Signed)
Pt noted to have a slight left facial droop not noticed on initial assessment. Dr. Cheral Marker informed and sooner MRI requested to MD. ?

## 2022-01-10 NOTE — Evaluation (Signed)
Clinical/Bedside Swallow Evaluation ?Patient Details  ?Name: Cynthia [name] ?MRN: [id number] ?Date of Birth: 07-25-1946 ? ?Today's Date: 01/10/2022 ?Time: SLP Start Time (ACUTE ONLY): 4098 SLP Stop Time (ACUTE ONLY): 1400 ?SLP Time Calculation (min) (ACUTE ONLY): 25 min ? ?Past Medical History:  ?Past Medical History:  ?Diagnosis Date  ? Breast cancer ([place])   ? Colon polyps   ? Depression   ? history of  ? Diverticulitis   ? GERD (gastroesophageal reflux disease)   ? Hypertension   ? Osteoporosis   ? Seizures ([place])   ? Stroke [facility])   ? ?Past Surgical History:  ?Past Surgical History:  ?Procedure Laterality Date  ? BREAST LUMPECTOMY WITH RADIOACTIVE SEED LOCALIZATION Left 08/28/2020  ? Procedure: LEFT BREAST LUMPECTOMY WITH RADIOACTIVE SEED LOCALIZATION;  Surgeon: Stark Klein, MD;  Location: [facility];  Service: General;  Laterality: Left;  RNFA  ? IR GENERIC HISTORICAL  08/05/2016  ? IR ANGIO VERTEBRAL SEL VERTEBRAL UNI R MOD SED 08/05/2016 Luanne Bras, MD [facility]-INTERV RAD  ? IR GENERIC HISTORICAL  08/05/2016  ? IR ANGIO INTRA EXTRACRAN SEL COM CAROTID INNOMINATE BILAT MOD SED 08/05/2016 Luanne Bras, MD [facility]-INTER[facility] RAD  ? IR GENERIC HISTORICAL  08/05/2016  ? IR ANGIO VERTEBRAL SEL SUBCLAVIAN INNOMINATE UNI L MOD SED 08/05/2016 Luanne Bras, MD [facility]-INTERV RAD  ? MOUTH SURGERY    ? TONSILLECTOMY    ? TUBAL LIGATION    ? ?HPI:  ?Cynthia [name] with past medical history of HTN, [place] 2017, seizures, GERD, Breast Cancer and depression who presents [facility] the ED [facility] for acute onset of left side weakness. LKW 0030, she awoke @ 0300 and had left side weakness and she slid out of bed. She attempted to stand hitting the back of her head on the night stand, that has bruising and swelling to left eye. She then crawled into the hallway to alert her son who found her at 1000am. Code stroke called by EMS. CTH with acute ICH; BSE ordered.  ?  ?Assessment / Plan / Recommendation  ?Clinical Impression ? Pt seen for clinical  swallowing evaluation with a  normal oropharyngeal swallow noted throughout assessment.  Timely swallow, good oral control/manipulation and pharyngeal clearance without overt s/s of aspiration present. Recommend initiating a regular/thin liquid diet.  ST informally assessed speech/language and cognition with intelligible speech noted within complex conversation; OME unremarkable.  Pt oriented x4 and able to follow multi-step directives during assessment.  Pragmatics appear WFL.  Formal assessment may be warranted if changes occur with cognition.  Thank you for this consult. ? ?SLP Visit Diagnosis: Dysphagia, unspecified (R13.10) ?   ?Aspiration Risk ? No limitations  ?  ?Diet Recommendation   Regular/thin liquids ? ?Medication Administration: Whole meds with liquid  ?  ?Other  Recommendations Oral Care Recommendations: Oral care BID   ? ?Recommendations for follow up therapy are one component of a multi-disciplinary discharge planning process, led by the attending physician.  Recommendations may be updated based on patient status, additional functional criteria and insurance authorization. ? ?Follow up Recommendations Follow physician's recommendations for discharge plan and follow up therapies  ? ? ?  ?Assistance Recommended at Discharge Other (comment) (TBD)  ?Functional Status Assessment Patient has had a recent decline in their functional status and demonstrates the ability to make significant improvements in function in a reasonable and predictable amount of time.  ?Frequency and Duration min 1 x/week  ?1 week ?  ?   ? ?Prognosis Prognosis for Safe Diet Advancement: Good  ? ?  ? ?  Swallow Study   ?General Date of Onset: 01/10/22 ?HPI: Cynthia [name] with past medical history of HTN, Cynthia [name] 2017, seizures, GERD, Breast Cancer and depression who presents to the ED via [facility] for acute onset of left side weakness. LKW 0030, she awoke @ 0300 and had left side weakness and she slid out of bed. She attempted to stand  hitting the back of her head on the night stand, that has bruising and swelling to left eye. She then crawled into the hallway to alert her son who found her at 1000am. Code stroke called by EMS. CTH with acute ICH; BSE ordered. ?Type of Study: Bedside Swallow Evaluation ?Previous Swallow Assessment: n/a ?Diet Prior to this Study: NPO ?Temperature Spikes Noted: No ?Respiratory Status: Room air ?History of Recent Intubation: No ?Behavior/Cognition: Alert;Cooperative ?Oral Cavity Assessment: Dry ?Oral Care Completed by SLP: Recent completion by staff ?Oral Cavity - Dentition: Adequate natural dentition ?Vision: Functional for self-feeding ?Self-Feeding Abilities: Able to feed self;Needs assist ?Patient Positioning: Upright in bed ?Baseline Vocal Quality: Normal ?Volitional Cough: Strong ?Volitional Swallow: Able to elicit  ?  ?Oral/Motor/Sensory Function Overall Oral Motor/Sensory Function: Within functional limits   ?Ice Chips Ice chips: Within functional limits ?Presentation: Spoon   ?Thin Liquid Thin Liquid: Within functional limits ?Presentation: Cup;Spoon;Straw  ?  ?Nectar Thick Nectar Thick Liquid: Not tested   ?Honey Thick Honey Thick Liquid: Not tested   ?Puree Puree: Within functional limits ?Presentation: Self Fed   ?Solid ? ? ?  Solid: Within functional limits ?Presentation: Self Fed  ? ?  ? ?Elvina Sidle, M.S., CCC-SLP ?01/10/2022,2:09 PM ? ? ? ? ?

## 2022-01-10 NOTE — ED Provider Notes (Signed)
?[place] ?Provider Note ? ? ?CSN: [id number] ?Arrival date & time: 01/10/22  1124 ? ?An emergency department physician performed an initial assessment on this suspected stroke patient at 1132. ? ?History ? ?Chief Complaint  ?Patient presents with  ? Code Stroke  ? ? ?Cynthia [name] is a 76 y.o. female. ? ?HPI ? ?76 year old female presenting to the emergency department as a code stroke.  She has a history of hypertension, ICH back in 2017, seizures, GERD, breast cancer who presents to the emergency department with acute onset of left-sided weakness.  She went to bed around 0030 and woke up this morning at 3 AM with left-sided weakness.  She had difficulty standing and fell and hit the back of her head on the night and.  She also struck the front of her face with some bruising and swelling to the left eye noted.  She was found this morning on the floor at 10 AM.  Code stroke was activated by EMS and the patient was evaluated on arrival at the bridge, her airway was cleared and she was taken back to the CT scanner with neurology for code stroke imaging.   ? ?Home Medications ?Prior to Admission medications   ?Medication Sig Start Date End Date Taking? Authorizing Provider  ?acetaminophen (TYLENOL) 500 MG tablet Take 1,000 mg by mouth every 6 (six) hours as needed for moderate pain or headache.    [provider]  ?alendronate (FOSAMAX) 70 MG tablet TAKE 1 TABLET BY MOUTH EVERY 7 DAYS. TAKE WITH FULL GLASS OF WATER ON AN EMPTY STOMACH 06/18/21   Susy Frizzle, MD  ?ALPRAZolam Duanne Moron) 0.5 MG tablet Take 1 tablet (0.5 mg total) by mouth 3 (three) times daily as needed for anxiety. 05/27/21   Susy Frizzle, MD  ?Calcium Carb-Cholecalciferol (CALCIUM CARBONATE-VITAMIN D3) 600-400 MG-UNIT TABS Take 1 tablet by mouth 2 (two) times daily.    [provider]  ?Cetirizine HCl 10 MG TBDP Take 1 tablet by mouth at bedtime. Aller-Tec (from [facility])    [provider]  ?EPINEPHRINE 0.3 mg/0.3 mL IJ SOAJ injection USE AS DIRECTED 03/05/21   Susy Frizzle, MD  ?famotidine (PEPCID) 20 MG tablet Take 1 tablet (20 mg total) by mouth 2 (two) times daily. 03/31/21   Susy Frizzle, MD  ?ferrous sulfate 325 (65 FE) MG tablet Take 325 mg by mouth daily.    [provider]  ?losartan (COZAAR) 100 MG tablet TAKE 1 TABLET BY MOUTH DAILY FOR BLOOD PRESSURE 07/12/21   Susy Frizzle, MD  ?venlafaxine XR (EFFEXOR-XR) 75 MG 24 hr capsule TAKE 1 CAPSULE BY MOUTH DAILY WITH BREAKFAST. 12/10/21   Susy Frizzle, MD  ?vitamin B-12 (CYANOCOBALAMIN) 1000 MCG tablet Take 1,000 mcg by mouth daily.    [provider]  ?zinc gluconate 50 MG tablet Take 50 mg by mouth daily.    [provider]  ?   ? ?Allergies    ?Other, Acyclovir and related, Codeine, and Penicillins   ? ?Review of Systems   ?Review of Systems  ?Unable to perform ROS: Acuity of condition  ? ?Physical Exam ?Updated Vital Signs ?BP 137/68   Pulse 96   Resp (!) 23   Wt 63.2 kg   SpO2 96%   BMI 22.49 kg/m?  ?Physical Exam ?Vitals and nursing note reviewed.  ?Constitutional:   ?   General: She is not in acute distress. ?HENT:  ?   Head: Normocephalic.  ?  Comments: Periorbital bruising and swelling noted around the left eye ?Eyes:  ?   Conjunctiva/sclera: Conjunctivae normal.  ?   Pupils: Pupils are equal, round, and reactive to light.  ?Neck:  ?   Comments: C-collar in place, no midline tenderness to palpation of the cervical spine ?Cardiovascular:  ?   Rate and Rhythm: Normal rate and regular rhythm.  ?Pulmonary:  ?   Effort: Pulmonary effort is normal. No respiratory distress.  ?Abdominal:  ?   General: There is no distension.  ?   Tenderness: There is no guarding.  ?Musculoskeletal:     ?   General: No deformity or signs of injury.  ?   Cervical back: Neck supple.  ?Skin: ?   Findings: No lesion or rash.  ?Neurological:  ?   Mental Status: She is alert and oriented to person,  place, and time.  ?   Comments: MENTAL STATUS EXAM:    ?Orientation: Alert and oriented to person, place and time.  ?Memory: Cooperative, follows commands well.  ?Language: Speech is clear and language is normal.  ? ?CRANIAL NERVES:    ?CN 2 (Optic): Visual fields intact to confrontation.  ?CN 3,4,6 (EOM): Pupils equal and reactive to light. Full extraocular eye movement without nystagmus.  ?CN 5 (Trigeminal): Facial sensation is normal, no weakness of masticatory muscles.  ?CN 7 (Facial): No facial weakness or asymmetry.  ?CN 8 (Auditory): Auditory acuity grossly normal.  ?CN 9,10 (Glossophar): The uvula is midline, the palate elevates symmetrically.  ?CN 11 (spinal access): Normal sternocleidomastoid and trapezius strength.  ?CN 12 (Hypoglossal): The tongue is midline. No atrophy or fasciculations..  ? ?MOTOR: Left hemibody deficits noted ? ? ? ?  ? ? ?ED Results / Procedures / Treatments   ?Labs ?(all labs ordered are listed, but only abnormal results are displayed) ?Labs Reviewed  ?PROTIME-INR  ?APTT  ?CBC  ?DIFFERENTIAL  ?COMPREHENSIVE METABOLIC PANEL  ?CBG MONITORING, ED  ? ? ?EKG ?None ? ?Radiology ?CT CERVICAL SPINE WO CONTRAST ? ?Result Date: 01/10/2022 ?CLINICAL DATA:  Neck trauma, uncomplicated (NEXUS/CCR neg) (Age 63-64y); Facial trauma, blunt EXAM: CT MAXILLOFACIAL WITHOUT CONTRAST CT CERVICAL SPINE WITHOUT CONTRAST TECHNIQUE: Multidetector CT imaging of the maxillofacial structures was performed. Multiplanar CT image reconstructions were also generated. A small metallic BB was placed on the right temple in order to reliably differentiate right from left. Multidetector CT imaging of the cervical spine was performed without intravenous contrast. Multiplanar CT image reconstructions were also generated. RADIATION DOSE REDUCTION: This exam was performed according to the departmental dose-optimization program which includes automated exposure control, adjustment of the mA and/or kV according to patient size  and/or use of iterative reconstruction technique. COMPARISON:  Same day head CT, CT head and neck 03/10/2021 FINDINGS: CT MAXILLOFACIAL FINDINGS Osseous: No fracture or mandibular dislocation. No destructive process. Orbits: Negative. No traumatic or inflammatory finding. Sinuses: Small amount of frothy material in the right maxillary sinuses. The other paranasal sinuses are predominantly clear. Soft tissues: Mild left periorbital soft tissue swelling. Partially visualized intraparenchymal edema associated with an intraparenchymal hematoma as seen on separately dictated head CT for intracranial findings. CT CERVICAL SPINE FINDINGS Alignment: Trace degenerative anterolisthesis at C3-C4, retrolisthesis at C4-C5, and grade 1 anterolisthesis at C6-C7. Skull base and vertebrae: No acute cervical spine fracture no acute osseous lesion. Soft tissues and spinal canal: No prevertebral fluid or swelling. No visible canal hematoma. Disc levels: There is multilevel degenerative disc disease, mild at C3-C4, moderate at C4-C5, and severe at C5-C6.  Multilevel facet arthropathy of varying degrees of severity. Upper chest: Unchanged 2.1 cm hypodense right thyroid nodule. Other: None IMPRESSION: No acute facial fracture. Mild left periorbital soft tissue swelling. No acute cervical spine fracture. Please see separately dictated head CT for intracranial findings. Electronically Signed   By: Maurine Simmering M.D.   On: 01/10/2022 12:26  ? ?CT HEAD CODE STROKE WO CONTRAST ? ?Result Date: 01/10/2022 ?CLINICAL DATA:  Code stroke.  Left-sided weakness EXAM: CT HEAD WITHOUT CONTRAST TECHNIQUE: Contiguous axial images were obtained from the base of the skull through the vertex without intravenous contrast. RADIATION DOSE REDUCTION: This exam was performed according to the departmental dose-optimization program which includes automated exposure control, adjustment of the mA and/or kV according to patient size and/or use of iterative reconstruction  technique. COMPARISON:  CT head 03/10/2021 FINDINGS: Brain: There is an intraparenchymal hematoma centered in the right parasagittal frontal lobe measuring 3.0 cm AP x 2.3 cm TV x 3.0 cm CC (approximately 10.4 cc). T

## 2022-01-11 ENCOUNTER — Inpatient Hospital Stay (HOSPITAL_COMMUNITY): Payer: Medicare PPO

## 2022-01-11 ENCOUNTER — Encounter (HOSPITAL_COMMUNITY): Payer: Self-pay | Admitting: Neurology

## 2022-01-11 DIAGNOSIS — R569 Unspecified convulsions: Secondary | ICD-10-CM | POA: Diagnosis not present

## 2022-01-11 DIAGNOSIS — I611 Nontraumatic intracerebral hemorrhage in hemisphere, cortical: Secondary | ICD-10-CM | POA: Diagnosis not present

## 2022-01-11 DIAGNOSIS — I680 Cerebral amyloid angiopathy: Secondary | ICD-10-CM | POA: Diagnosis not present

## 2022-01-11 LAB — BASIC METABOLIC PANEL
Anion gap: 8 (ref 5–15)
BUN: 18 mg/dL (ref 8–23)
CO2: 26 mmol/L (ref 22–32)
Calcium: 9.1 mg/dL (ref 8.9–10.3)
Chloride: 105 mmol/L (ref 98–111)
Creatinine, Ser: 0.84 mg/dL (ref 0.44–1.00)
GFR, Estimated: >60 mL/min (ref >60)
Glucose, Bld: 112 mg/dL — ABNORMAL HIGH (ref 70–99)
Potassium: 3.4 mmol/L — ABNORMAL LOW (ref 3.5–5.1)
Sodium: 139 mmol/L (ref 135–145)

## 2022-01-11 LAB — GLUCOSE, CAPILLARY: Glucose-Capillary: 165 mg/dL — ABNORMAL HIGH (ref 70–99)

## 2022-01-11 LAB — LIPID PANEL
Cholesterol: 200 mg/dL (ref 0–200)
HDL: 88 mg/dL (ref >40)
LDL Cholesterol: 94 mg/dL (ref 0–99)
Total CHOL/HDL Ratio: 2.3 RATIO
Triglycerides: 92 mg/dL (ref <150)
VLDL: 18 mg/dL (ref 0–40)

## 2022-01-11 LAB — CBC
HCT: 37.7 % (ref 36.0–46.0)
Hemoglobin: 12.7 g/dL (ref 12.0–15.0)
MCH: 30.2 pg (ref 26.0–34.0)
MCHC: 33.7 g/dL (ref 30.0–36.0)
MCV: 89.8 fL (ref 80.0–100.0)
Platelets: 270 10*3/uL (ref 150–400)
RBC: 4.2 MIL/uL (ref 3.87–5.11)
RDW: 12.8 % (ref 11.5–15.5)
WBC: 6.8 10*3/uL (ref 4.0–10.5)
nRBC: 0 % (ref 0.0–0.2)

## 2022-01-11 LAB — HEMOGLOBIN A1C
Hgb A1c MFr Bld: 5.7 % — ABNORMAL HIGH (ref 4.8–5.6)
Mean Plasma Glucose: 116.89 mg/dL

## 2022-01-11 MED ORDER — LORAZEPAM 2 MG/ML IJ SOLN
2.0000 mg | Freq: Once | INTRAMUSCULAR | Status: AC
  Administered 2022-01-11: 2 mg via INTRAVENOUS

## 2022-01-11 MED ORDER — VENLAFAXINE HCL ER 75 MG PO CP24: 75.0000 mg | ORAL_CAPSULE | Freq: Every day | ORAL | Status: DC

## 2022-01-11 MED ORDER — ZINC GLUCONATE 50 MG PO TABS: 50.0000 mg | ORAL_TABLET | Freq: Every day | ORAL | Status: DC

## 2022-01-11 MED ORDER — LORAZEPAM 2 MG/ML IJ SOLN
INTRAMUSCULAR | Status: AC
  Filled 2022-01-11: qty 1

## 2022-01-11 MED ORDER — LEVETIRACETAM IN NACL 500 MG/100ML IV SOLN
500.0000 mg | Freq: Two times a day (BID) | INTRAVENOUS | Status: DC
  Administered 2022-01-12: 500 mg via INTRAVENOUS
  Filled 2022-01-11: qty 100

## 2022-01-11 MED ORDER — SODIUM CHLORIDE 0.9 % IV SOLN
2000.0000 mg | Freq: Once | INTRAVENOUS | Status: AC
  Administered 2022-01-11: 2000 mg via INTRAVENOUS
  Filled 2022-01-11: qty 20

## 2022-01-11 MED ORDER — CALCIUM CARBONATE-VITAMIN D3 600-400 MG-UNIT PO TABS: 1.0000 | ORAL_TABLET | Freq: Two times a day (BID) | ORAL | Status: DC

## 2022-01-11 MED ORDER — VENLAFAXINE HCL ER 75 MG PO CP24
75.0000 mg | ORAL_CAPSULE | Freq: Every day | ORAL | Status: DC
  Administered 2022-01-12 – 2022-01-18 (×7): 75 mg via ORAL
  Filled 2022-01-11 (×8): qty 1

## 2022-01-11 MED ORDER — VITAMIN B-12 1000 MCG PO TABS
1000.0000 ug | ORAL_TABLET | Freq: Every day | ORAL | Status: DC
  Administered 2022-01-11 – 2022-01-18 (×8): 1000 ug via ORAL
  Filled 2022-01-11 (×8): qty 1

## 2022-01-11 MED ORDER — LABETALOL HCL 5 MG/ML IV SOLN: 5.0000 mg | INTRAVENOUS | Status: DC | PRN

## 2022-01-11 MED ORDER — LOSARTAN POTASSIUM 50 MG PO TABS
100.0000 mg | ORAL_TABLET | Freq: Every day | ORAL | Status: DC
  Administered 2022-01-11 – 2022-01-18 (×8): 100 mg via ORAL
  Filled 2022-01-11 (×9): qty 2

## 2022-01-11 MED ORDER — LORAZEPAM 2 MG/ML IJ SOLN: 2.0000 mg | INTRAMUSCULAR | Status: DC | PRN

## 2022-01-11 MED ORDER — CHLORHEXIDINE GLUCONATE CLOTH 2 % EX PADS
6.0000 | MEDICATED_PAD | Freq: Every morning | CUTANEOUS | Status: DC
  Administered 2022-01-11 – 2022-01-14 (×3): 6 via TOPICAL

## 2022-01-11 MED ORDER — SODIUM CHLORIDE 0.9 % IV SOLN
INTRAVENOUS | Status: DC
Start: 2022-01-11 — End: 2022-01-11

## 2022-01-11 MED ORDER — POTASSIUM CHLORIDE CRYS ER 20 MEQ PO TBCR
40.0000 meq | EXTENDED_RELEASE_TABLET | Freq: Once | ORAL | Status: AC
Start: 2022-01-11 — End: 2022-01-11
  Administered 2022-01-11: 40 meq via ORAL
  Filled 2022-01-11: qty 2

## 2022-01-11 NOTE — Progress Notes (Signed)
Attempted carotid artery duplex, however patient is in CT. Will attempt again when patient is available as schedule permits. ? ?01/11/2022 1:50 PM ?Kelby Aline., MHA, RVT, RDCS, RDMS  ?

## 2022-01-11 NOTE — Evaluation (Signed)
Physical Therapy Evaluation ?Patient Details ?Name: Cynthia [name] ?MRN: [id number] ?DOB: 1946/07/04 ?Today's Date: 01/11/2022 ? ?History of Present Illness ? 56 female admited with L side weakness and falling stiking head on night stand. pt found to have acute R frontal ICH. PMH HTN,  R frontal ICH 2017 seizures Breast Ca depression diverticulitis, osteoporosis, CVA  ?Clinical Impression ? PTA, pt lives with her son who works third shift and is independent and active. Pt reports prior R frontal ICH in 2017 in which she "has recovered almost 100%." Pt has good awareness and verbalizations of deficits. Pt presents with left hemiplegia, gait abnormalities, impaired sitting/standing balance. Pt ambulating 5 ft with two person moderate assist; max multimodal cueing for weight shifting, stepping initiation, and tactile facilitation at L quad to prevent buckle. Pt also with excessive L foot supination; reports son can bring tennis shoes. Highly recommend AIR to address deficits, maximize functional mobility and decrease caregiver burden. Suspect excellent progress given PLOF and high motivation. ?   ? ?Recommendations for follow up therapy are one component of a multi-disciplinary discharge planning process, led by the attending physician.  Recommendations may be updated based on patient status, additional functional criteria and insurance authorization. ? ?Follow Up Recommendations Acute inpatient rehab (3hours/day) ? ?  ?Assistance Recommended at Discharge PRN  ?Patient can return home with the following ? A lot of help with walking and/or transfers;A little help with bathing/dressing/bathroom ? ?  ?Equipment Recommendations Other (comment) (TBA)  ?Recommendations for Other Services ? Rehab consult  ?  ?Functional Status Assessment Patient has had a recent decline in their functional status and demonstrates the ability to make significant improvements in function in a reasonable and predictable amount of time.  ? ?   ?Precautions / Restrictions Precautions ?Precautions: Fall;Other (comment) ?Precaution Comments: BP 130-150 ?Restrictions ?Weight Bearing Restrictions: No  ? ?  ? ?Mobility ? Bed Mobility ?Overal bed mobility: Needs Assistance ?Bed Mobility: Rolling, Supine to Sit ?Rolling: Min guard ?  ?Supine to sit: Min guard ?  ?  ?General bed mobility comments: pt requires increased time and effort. pt progressing with R side of body and pivoting on L side to exit. Pt with LLE in extension and L UE adducted to the truck ?  ? ?Transfers ?Overall transfer level: Needs assistance ?Equipment used: None ?Transfers: Sit to/from Stand ?Sit to Stand: +2 physical assistance, Min assist ?  ?  ?  ?  ?  ?General transfer comment: MinA +2 to rise from edge of bed x 2, left knee and lateral foot block to prevent excessive supination ?  ? ?Ambulation/Gait ?Ambulation/Gait assistance: Mod assist, +2 physical assistance ?Gait Distance (Feet): 5 Feet ?Assistive device: None ?Gait Pattern/deviations: Step-to pattern, Decreased stance time - left, Decreased dorsiflexion - left, Decreased weight shift to left, Knees buckling, Trunk flexed ?Gait velocity: decreased ?Gait velocity interpretation: <1.31 ft/sec, indicative of household ambulator ?  ?General Gait Details: Pt taking ~5 steps forward with modA + 2, tactile facilitation at R knee to prevent block and at lateral foot to prevent excessive supination. Max multimodal cues for weight shifting, stepping initiation, upright posture, glute activation ? ?Stairs ?  ?  ?  ?  ?  ? ?Wheelchair Mobility ?  ? ?Modified Rankin (Stroke Patients Only) ?Modified Rankin (Stroke Patients Only) ?Pre-Morbid Rankin Score: No symptoms ?Modified Rankin: Moderately severe disability ? ?  ? ?Balance Overall balance assessment: Needs assistance ?Sitting-balance support: Feet supported, Single extremity supported ?Sitting balance-Leahy Scale: Fair ?  ?  ?  Standing balance support: Bilateral upper extremity supported,  During functional activity ?Standing balance-Leahy Scale: Poor ?Standing balance comment: requires blocking of L LE to prevent buckle ?  ?  ?  ?  ?  ?  ?  ?  ?  ?  ?  ?   ? ? ? ?Pertinent Vitals/Pain Pain Assessment ?Pain Assessment: Faces ?Faces Pain Scale: Hurts little more ?Pain Location: back ?Pain Descriptors / Indicators: Sore ?Pain Intervention(s): Monitored during session  ? ? ?Home Living Family/patient expects to be discharged to:: Private residence ?Living Arrangements: Children ?Available Help at Discharge: Family ?Type of Home: House ?Home Access: Stairs to enter ?Entrance Stairs-Rails: None ?Entrance Stairs-Number of Steps: 4 (back door 4 steps with rails) ?  ?Home Layout: Able to live on main level with bedroom/bathroom ?Home Equipment: None ?Additional Comments: son works 3rd shift and home during the day  ?  ?Prior Function Prior Level of Function : Independent/Modified Independent;Driving ?  ?  ?  ?  ?  ?  ?Mobility Comments: Retired Pharmacist, hospital, active ?  ?  ? ? ?Hand Dominance  ? Dominant Hand: Right ? ?  ?Extremity/Trunk Assessment  ? Upper Extremity Assessment ?Upper Extremity Assessment: LUE deficits/detail ?LUE Deficits / Details: decreased shoulder flexion to 100 degrees with effort. pt holding adducted to the body and needs cues to move. L inattention to L UE. ?LUE Sensation: decreased proprioception ?LUE Coordination: decreased fine motor;decreased gross motor ?  ? ?Lower Extremity Assessment ?Lower Extremity Assessment: LLE deficits/detail ?LLE Deficits / Details: Grossly 3+/5 ?  ? ?Cervical / Trunk Assessment ?Cervical / Trunk Assessment: Normal  ?Communication  ? Communication: No difficulties  ?Cognition Arousal/Alertness: Awake/alert ?Behavior During Therapy: [facility] for tasks assessed/performed ?Overall Cognitive Status: Within Functional Limits for tasks assessed ?  ?  ?  ?  ?  ?  ?  ?  ?  ?  ?  ?  ?  ?  ?  ?  ?General Comments: pt able to answer questions of session. questions executive  functional cognitive health at this time. pt tangential at times referring to previous 2017 event and current event. therapist asking clarification questions ?  ?  ? ?  ?General Comments   ? ?  ?Exercises Other Exercises ?Other Exercises: worked on [employer] shifting ?Other Exercises: worked on placement L UE on bed surface and awareness visually  ? ?Assessment/Plan  ?  ?PT Assessment Patient needs continued PT services  ?PT Problem List Decreased strength;Decreased activity tolerance;Decreased balance;Decreased mobility;Decreased coordination ? ?   ?  ?PT Treatment Interventions Gait training;DME instruction;Stair training;Functional mobility training;Therapeutic activities;Therapeutic exercise;Balance training;Patient/family education   ? ?PT Goals (Current goals can be found in the Care Plan section)  ?Acute Rehab PT Goals ?Patient Stated Goal: to get back to baseline ?PT Goal Formulation: With patient ?Time For Goal Achievement: 01/25/22 ?Potential to Achieve Goals: Good ? ?  ?Frequency Min 4X/week ?  ? ? ?Co-evaluation PT/OT/SLP Co-Evaluation/Treatment: Yes ?Reason for Co-Treatment: For patient/therapist safety;To address functional/ADL transfers ?PT goals addressed during session: Mobility/safety with mobility ?  ?  ? ? ?  ?AM-PAC PT "6 Clicks" Mobility  ?Outcome Measure Help needed turning from your back to your side while in a flat bed without using bedrails?: A Little ?Help needed moving from lying on your back to sitting on the side of a flat bed without using bedrails?: A Little ?Help needed moving to and from a bed to a chair (including a wheelchair)?: A Little ?Help needed standing up from  a chair using your arms (e.g., wheelchair or bedside chair)?: A Little ?Help needed to walk in hospital room?: Total ?Help needed climbing 3-5 steps with a railing? : Total ?6 Click Score: 14 ? ?  ?End of Session Equipment Utilized During Treatment: Gait belt ?Activity Tolerance: Patient tolerated treatment  well ?Patient left: in chair;with call bell/phone within reach;with chair alarm set ?Nurse Communication: Mobility status ?PT Visit Diagnosis: Unsteadiness on feet (R26.81);Other abnormalities of gait and mobility

## 2022-01-11 NOTE — Progress Notes (Signed)
OT Cancellation Note ? ?Patient Details ?Name: Cynthia [name] ?MRN: [id number] ?DOB: 1946/07/07 ? ? ?Cancelled Treatment:    Reason Eval/Treat Not Completed: Active bedrest order OT order received and appreciated however this conflicts with current bedrest order set. Please increase activity tolerance as appropriate and remove bedrest from orders. . Please contact OT at [phone] if bed rest order is discontinued. OT will hold evaluation at this time and will check back as time allows pending increased activity orders. ? ? ?Jeri Modena ?01/11/2022, 8:00 AM ?

## 2022-01-11 NOTE — Progress Notes (Signed)
EEG complete - results pending 

## 2022-01-11 NOTE — Progress Notes (Signed)
STROKE TEAM PROGRESS NOTE  ? ?SUBJECTIVE (INTERVAL HISTORY) ?Pt was seen in the morning doing well. Sitting in chair, still has left sided hemiparesis but mild. Pending MRI and MRA.  ? ?However, at lunch time, she was sitting in chair for lunch, her son was just arrived. And she started to have left arm shaking and then generalized to GTC, lasting 2-3 min. Received ativan '2mg'$  and loaded with keppra. Stat CT showed stable hematoma. Stat EEG pending.  ? ? ?OBJECTIVE ?Temp:  [97.9 ?F (36.6 ?C)-98.1 ?F (36.7 ?C)] 98 ?F (36.7 ?C) (05/02 0800) ?Pulse Rate:  [70-107] 88 (05/02 1000) ?Cardiac Rhythm: Sinus tachycardia (05/02 0800) ?Resp:  [12-26] 15 (05/02 1000) ?BP: (107-155)/(57-99) 149/88 (05/02 1000) ?SpO2:  [91 %-98 %] 96 % (05/02 1000) ?Weight:  [59.6 kg] 59.6 kg (05/02 0545) ? ?Recent Labs  ?Lab 01/10/22 ?1132  ?GLUCAP 95  ? ?Recent Labs  ?Lab 01/10/22 ?1253 01/11/22 ?0249  ?NA 141 139  ?K 4.0 3.4*  ?CL 107 105  ?CO2 27 26  ?GLUCOSE 108* 112*  ?BUN 17 18  ?CREATININE 0.70 0.84  ?CALCIUM 9.2 9.1  ? ?Recent Labs  ?Lab 01/10/22 ?1253  ?AST 33  ?ALT 20  ?ALKPHOS 57  ?BILITOT 0.9  ?PROT 6.3*  ?ALBUMIN 3.8  ? ?Recent Labs  ?Lab 01/10/22 ?1253 01/11/22 ?0249  ?WBC 9.1 6.8  ?NEUTROABS 7.2  --   ?HGB 13.2 12.7  ?HCT 38.3 37.7  ?MCV 90.1 89.8  ?PLT 256 270  ? ?No results for input(s): CKTOTAL, CKMB, CKMBINDEX, TROPONINI in the last 168 hours. ?Recent Labs  ?  01/10/22 ?1253  ?LABPROT 12.9  ?INR 1.0  ? ?No results for input(s): COLORURINE, LABSPEC, [place], GLUCOSEU, HGBUR, BILIRUBINUR, KETONESUR, PROTEINUR, UROBILINOGEN, NITRITE, LEUKOCYTESUR in the last 72 hours. ? ?Invalid input(s): APPERANCEUR  ?   ?Component Value Date/Time  ? CHOL 200 01/11/2022 0249  ? TRIG 92 01/11/2022 0249  ? HDL 88 01/11/2022 0249  ? CHOLHDL 2.3 01/11/2022 0249  ? VLDL 18 01/11/2022 0249  ? [place] 94 01/11/2022 0249  ? LDLCALC 101 (H) 05/25/2020 1111  ? ?Lab Results  ?Component Value Date  ? HGBA1C 5.7 (H) 01/11/2022  ? ?   ?Component Value  Date/Time  ? LABOPIA NONE DETECTED 03/20/2016 1331  ? COCAINSCRNUR NONE DETECTED 03/20/2016 1331  ? LABBENZ NONE DETECTED 03/20/2016 1331  ? AMPHETMU NONE DETECTED 03/20/2016 1331  ? THCU NONE DETECTED 03/20/2016 1331  ? LABBARB NONE DETECTED 03/20/2016 1331  ?  ?No results for input(s): ETH in the last 168 hours. ? ?I have personally reviewed the radiological images below and agree with the radiology interpretations. ? ?CT CERVICAL SPINE WO CONTRAST ? ?Result Date: 01/10/2022 ?CLINICAL DATA:  Neck trauma, uncomplicated (NEXUS/CCR neg) (Age 7-64y); Facial trauma, blunt EXAM: CT MAXILLOFACIAL WITHOUT CONTRAST CT CERVICAL SPINE WITHOUT CONTRAST TECHNIQUE: Multidetector CT imaging of the maxillofacial structures was performed. Multiplanar CT image reconstructions were also generated. A small metallic BB was placed on the right temple in order to reliably differentiate right from left. Multidetector CT imaging of the cervical spine was performed without intravenous contrast. Multiplanar CT image reconstructions were also generated. RADIATION DOSE REDUCTION: This exam was performed according to the departmental dose-optimization program which includes automated exposure control, adjustment of the mA and/or kV according to patient size and/or use of iterative reconstruction technique. COMPARISON:  Same day head CT, CT head and neck 03/10/2021 FINDINGS: CT MAXILLOFACIAL FINDINGS Osseous: No fracture or mandibular dislocation. No destructive process. Orbits: Negative. No traumatic  or inflammatory finding. Sinuses: Small amount of frothy material in the right maxillary sinuses. The other paranasal sinuses are predominantly clear. Soft tissues: Mild left periorbital soft tissue swelling. Partially visualized intraparenchymal edema associated with an intraparenchymal hematoma as seen on separately dictated head CT for intracranial findings. CT CERVICAL SPINE FINDINGS Alignment: Trace degenerative anterolisthesis at C3-C4,  retrolisthesis at C4-C5, and grade 1 anterolisthesis at C6-C7. Skull base and vertebrae: No acute cervical spine fracture no acute osseous lesion. Soft tissues and spinal canal: No prevertebral fluid or swelling. No visible canal hematoma. Disc levels: There is multilevel degenerative disc disease, mild at C3-C4, moderate at C4-C5, and severe at C5-C6. Multilevel facet arthropathy of varying degrees of severity. Upper chest: Unchanged 2.1 cm hypodense right thyroid nodule. Other: None IMPRESSION: No acute facial fracture. Mild left periorbital soft tissue swelling. No acute cervical spine fracture. Please see separately dictated head CT for intracranial findings. Electronically Signed   By: Maurine Simmering M.D.   On: 01/10/2022 12:26  ? ?ECHOCARDIOGRAM COMPLETE ? ?Result Date: 01/10/2022 ?   ECHOCARDIOGRAM REPORT   Patient Name:   Cynthia [name] Date of Exam: 01/10/2022 Medical Rec #:  [id number]         Height:       66.0 in Accession #:    [id number]        Weight:       139.3 lb Date of Birth:  [birth year]         BSA:          1.715 m? Patient Age:    76 years          BP:           145/76 mmHg Patient Gender: F                 HR:           89 bpm. Exam Location:  Inpatient Procedure: 2D Echo, Color Doppler and Intracardiac Opacification Agent Indications:   CVA  History:       Patient has no prior history of Echocardiogram examinations.                Stroke.  Sonographer:   Luisa Hart RDCS Referring      [id number] Rosalin Hawking Phys: IMPRESSIONS  1. Left ventricular ejection fraction, by estimation, is 60 to 65%. The left ventricle has normal function. The left ventricle has no regional wall motion abnormalities. Left ventricular diastolic parameters were normal.  2. Right ventricular systolic function is normal. The right ventricular size is normal. There is normal pulmonary artery systolic pressure.  3. Right atrial size was mildly dilated.  4. The mitral valve was not well visualized. Trivial mitral valve  regurgitation. No evidence of mitral stenosis.  5. The aortic valve was not well visualized. There is mild calcification of the aortic valve. Aortic valve regurgitation is not visualized. Aortic valve sclerosis is present, with no evidence of aortic valve stenosis.  6. The inferior vena cava is normal in size with <50% respiratory variability, suggesting right atrial pressure of 8 mmHg. Comparison(s): No prior Echocardiogram. Conclusion(s)/Recommendation(s): Otherwise normal echocardiogram, with minor abnormalities described in the report. No intracardiac source of embolism detected on this transthoracic study. Consider a transesophageal echocardiogram to exclude cardiac source of embolism if clinically indicated. FINDINGS  Left Ventricle: Left ventricular ejection fraction, by estimation, is 60 to 65%. The left ventricle has normal function. The left ventricle has no regional wall motion abnormalities. Definity contrast agent  was given IV to delineate the left ventricular  endocardial borders. The left ventricular internal cavity size was normal in size. There is no left ventricular hypertrophy. Left ventricular diastolic parameters were normal. Right Ventricle: The right ventricular size is normal. Right vetricular wall thickness was not well visualized. Right ventricular systolic function is normal. There is normal pulmonary artery systolic pressure. The tricuspid regurgitant velocity is 2.59 m/s, and with an assumed right atrial pressure of 8 mmHg, the estimated right ventricular systolic pressure is 02.5 mmHg. Left [facility]: Left atrial size was normal in size. Right [facility]: Right atrial size was mildly dilated. Pericardium: There is no evidence of pericardial effusion. Mitral Valve: The mitral valve was not well visualized. Trivial mitral valve regurgitation. No evidence of mitral valve stenosis. Tricuspid Valve: The tricuspid valve is not well visualized. Tricuspid valve regurgitation is trivial. No evidence of  tricuspid stenosis. Aortic Valve: The aortic valve was not well visualized. There is mild calcification of the aortic valve. Aortic valve regurgitation is not visualized. Aortic valve sclerosis is present, with no evidence

## 2022-01-11 NOTE — Procedures (Signed)
Patient Name: Cynthia [name]  ?MRN: [id number]  ?Epilepsy Attending: Lora Havens  ?Referring Physician/Provider: Rosalin Hawking, MD ?Date: 01/11/2022 ?Duration: 21.36 mins ? ?Patient history: 62 old female had an episode of left arm shaking followed by generalized tonic-clonic seizure.  EEG evaluate for seizure. ? ?Level of alertness: Asleep ? ?AEDs during EEG study: Keppra, Ativan ? ?Technical aspects: This EEG study was done with scalp electrodes positioned according to the 10-20 International system of electrode placement. Electrical activity was acquired at a sampling rate of '500Hz'$  and reviewed with a high frequency filter of '70Hz'$  and a low frequency filter of '1Hz'$ . EEG data were recorded continuously and digitally stored.  ? ?Description: Sleep was characterized by vertex waves, sleep spindles (12 to 14 Hz), maximal frontocentral region. EEG showed continuous 5-'6hz'$  theta-delta slowing in right hemisphere. Hyperventilation and photic stimulation were not performed.    ? ?ABNORMALITY ?- Continuous slow, right hemisphere ? ?IMPRESSION: ?This study during sleep only is suggestive of cortical dysfunction arising from right hemisphere likely secondary to underlying ICH. No seizures or epileptiform discharges were seen throughout the recording. ? ?Lora Havens   ?

## 2022-01-11 NOTE — Progress Notes (Signed)
?  Transition of Care (TOC) Screening Note ? ? ?Patient Details  ?Name: Cynthia [name] ?Date of Birth: 1946-08-16 ? ? ?Transition of Care (TOC) CM/SW Contact:    ?Benard Halsted, LCSW ?Phone Number: ?01/11/2022, 5:47 PM ? ? ? ?Transition of Care Department [facility]) has reviewed patient. We will continue to monitor patient advancement through interdisciplinary progression rounds. If new patient transition needs arise, please place a TOC consult. ? ? ?

## 2022-01-11 NOTE — Plan of Care (Signed)
?  Problem: Education: ?Goal: Knowledge of disease or condition will improve ?Outcome: Progressing ?Goal: Knowledge of secondary prevention will improve (SELECT ALL) ?Outcome: Progressing ?  ?Problem: Coping: ?Goal: Will verbalize positive feelings about self ?Outcome: Progressing ?  ?Problem: Intracerebral Hemorrhage Tissue Perfusion: ?Goal: Complications of Intracerebral Hemorrhage will be minimized ?Outcome: Progressing ?  ?Problem: Safety: ?Goal: Non-violent Restraint(s) ?Outcome: Progressing ?  ?Problem: Education: ?Goal: Expressions of having a comfortable level of knowledge regarding the disease process will increase ?Outcome: Progressing ?  ?Problem: Coping: ?Goal: Ability to adjust to condition or change in health will improve ?Outcome: Progressing ?  ?

## 2022-01-11 NOTE — Progress Notes (Signed)
SLP Cancellation Note ? ?Patient Details ?Name: Cynthia [name] ?MRN: [id number] ?DOB: 10-09-1945 ? ? ?Cancelled treatment:       Reason Eval/Treat Not Completed: Other (comment) Pt working with other therapies. Will f/u for cognitive-linguistic evaluation as able.  ? ? ? ?Osie Bond., M.A. CCC-SLP ?Acute Rehabilitation Services ?Office [phone] ? ?Secure chat preferred ? ?01/11/2022, 10:44 AM ?

## 2022-01-11 NOTE — Progress Notes (Signed)
Started cEEG study.  Notified [facility] monitoring. Tested patient event button.  Educated family RE patient event button. ?

## 2022-01-11 NOTE — Evaluation (Signed)
Occupational Therapy Evaluation Patient Details Name: Cynthia [name] MRN: [id number] DOB: [birth year] Today's Date: 01/11/2022   History of Present Illness 31 female admited with L side weakness and falling stiking head on night stand. pt found to have acute R frontal ICH. PMH HTN,  R frontal ICH 2017 seizures Breast Ca depression diverticulitis, osteoporosis, CVA   Clinical Impression   PT admitted with R frontal ICH. Pt currently with functional limitiations due to the deficits listed below (see OT problem list). Pt currently with L side weakness and balance deficits. Pt is able to verbalize deficits and states "they say I have some L side inattention." Pt with previous CVA 2017 and states "it was just like this" "I did some better than I thought I would today". Pt motivated and states "I am not going to have this get me down".  Pt will benefit from skilled OT to increase their independence and safety with adls and balance to allow discharge CIR.   Son lives with patient and home during the day (works 3rd shift)     Recommendations for follow up therapy are one component of a multi-disciplinary discharge planning process, led by the attending physician.  Recommendations may be updated based on patient status, additional functional criteria and insurance authorization.   Follow Up Recommendations  Acute inpatient rehab (3hours/day)    Assistance Recommended at Discharge Set up Supervision/Assistance  Patient can return home with the following A lot of help with walking and/or transfers;A lot of help with bathing/dressing/bathroom;Assistance with cooking/housework;Assistance with feeding;Direct supervision/assist for medications management;Direct supervision/assist for financial management;Assist for transportation    Functional Status Assessment  Patient has had a recent decline in their functional status and demonstrates the ability to make significant improvements in function in a  reasonable and predictable amount of time.  Equipment Recommendations  BSC/3in1    Recommendations for Other Services Rehab consult     Precautions / Restrictions Precautions Precautions: Fall      Mobility Bed Mobility Overal bed mobility: Needs Assistance Bed Mobility: Rolling, Supine to Sit Rolling: Min guard   Supine to sit: Min guard     General bed mobility comments: pt requires increased time and effort. pt progressing with R side of body and pivoting on L side to exit. Pt with LLE in extension and L UE adducted to the truck    Transfers Overall transfer level: Needs assistance   Transfers: Sit to/from Stand Sit to Stand: +2 physical assistance, Mod assist                  Balance Overall balance assessment: Needs assistance Sitting-balance support: Feet supported, Single extremity supported Sitting balance-Leahy Scale: Fair     Standing balance support: Bilateral upper extremity supported, During functional activity Standing balance-Leahy Scale: Poor Standing balance comment: requires blocking of L LE to prevent buckle                           ADL either performed or assessed with clinical judgement   ADL Overall ADL's : Needs assistance/impaired Eating/Feeding: Set up;Sitting   Grooming: Set up;Sitting   Upper Body Bathing: Minimal assistance;Sitting   Lower Body Bathing: Maximal assistance;Sit to/from stand   Upper Body Dressing : Minimal assistance;Sitting   Lower Body Dressing: Maximal assistance;Sit to/from stand   Toilet Transfer: +2 for physical assistance;Maximal assistance           Functional mobility during ADLs: +2 for physical assistance;+2 for safety/equipment;Moderate  assistance       Vision Baseline Vision/History: 1 Wears glasses (reports wearing glasses for night driving and reading books) Patient Visual Report: No change from baseline Additional Comments: reports trouble with reading text , able to read  menu phone number, able to read the clock     Perception     Praxis      Pertinent Vitals/Pain Pain Assessment Pain Assessment: Faces Faces Pain Scale: Hurts little more Pain Location: back Pain Descriptors / Indicators: Sore Pain Intervention(s): Monitored during session, Repositioned     Hand Dominance Right   Extremity/Trunk Assessment Upper Extremity Assessment Upper Extremity Assessment: LUE deficits/detail LUE Deficits / Details: decreased shoulder flexion to 100 degrees with effort. pt holding adducted to the body and needs cues to move. L inattention to L UE. LUE Sensation: decreased proprioception LUE Coordination: decreased fine motor;decreased gross motor   Lower Extremity Assessment Lower Extremity Assessment: Defer to PT evaluation   Cervical / Trunk Assessment Cervical / Trunk Assessment: Normal   Communication Communication Communication: No difficulties   Cognition Arousal/Alertness: Awake/alert Behavior During Therapy: WFL for tasks assessed/performed Overall Cognitive Status: Within Functional Limits for tasks assessed                                 General Comments: pt able to answer questions of session. questions executive functional cognitive health at this time. pt tangential at times referring to previous 2017 event and current event. therapist asking clarification questions     General Comments  BP supine 148/89 sitting 155/80 end of session in chair 147/89    Exercises Exercises: Other exercises Other Exercises Other Exercises: worked on static weight shifting Other Exercises: worked on placement L UE on bed surface and awareness visually   Shoulder Instructions      Home Living Family/patient expects to be discharged to:: Private residence Living Arrangements: Children Available Help at Discharge: Family Type of Home: House Home Access: Stairs to enter Secretary/administrator of Steps: 4 (back door 4 steps with  rails) Entrance Stairs-Rails: None Home Layout: Able to live on main level with bedroom/bathroom     Bathroom Shower/Tub: Chief Strategy Officer: Standard     Home Equipment: None   Additional Comments: son works 3rd shift and home during the day      Prior Functioning/Environment Prior Level of Function : Independent/Modified Independent;Driving                        OT Problem List: Decreased strength;Decreased activity tolerance;Impaired balance (sitting and/or standing);Decreased cognition;Decreased safety awareness;Decreased knowledge of use of DME or AE;Decreased knowledge of precautions;Cardiopulmonary status limiting activity;Impaired UE functional use      OT Treatment/Interventions: Self-care/ADL training;Therapeutic exercise;Neuromuscular education;DME and/or AE instruction;Therapeutic activities;Cognitive remediation/compensation;Patient/family education;Balance training    OT Goals(Current goals can be found in the care plan section) Acute Rehab OT Goals Patient Stated Goal: to get better OT Goal Formulation: With patient Time For Goal Achievement: 01/25/22 Potential to Achieve Goals: Good  OT Frequency: Min 2X/week    Co-evaluation PT/OT/SLP Co-Evaluation/Treatment: Yes Reason for Co-Treatment: For patient/therapist safety   OT goals addressed during session: ADL's and self-care;Proper use of Adaptive equipment and DME      AM-PAC OT "6 Clicks" Daily Activity     Outcome Measure Help from another person eating meals?: A Little Help from another person taking care of personal grooming?: A Little  Help from another person toileting, which includes using toliet, bedpan, or urinal?: A Little Help from another person bathing (including washing, rinsing, drying)?: A Lot Help from another person to put on and taking off regular upper body clothing?: A Little Help from another person to put on and taking off regular lower body clothing?: A  Lot 6 Click Score: 16   End of Session Equipment Utilized During Treatment: Gait belt Nurse Communication: Mobility status;Precautions  Activity Tolerance: Patient tolerated treatment well Patient left: in chair;with call bell/phone within reach;with chair alarm set  OT Visit Diagnosis: Unsteadiness on feet (R26.81);Hemiplegia and hemiparesis Hemiplegia - Right/Left: Left Hemiplegia - dominant/non-dominant: Non-Dominant                Time: 7829-5621 OT Time Calculation (min): 26 min Charges:  OT General Charges $OT Visit: 1 Visit OT Evaluation $OT Eval Moderate Complexity: 1 Mod   Brynn, OTR/L  Acute Rehabilitation Services Office: [phone] .   Mateo Flow 01/11/2022, 11:46 AM

## 2022-01-11 NOTE — Progress Notes (Signed)
Carotid artery duplex completed. ?Refer to "CV Proc" under chart review to view preliminary results. ? ?01/11/2022 2:06 PM ?Kelby Aline., MHA, RVT, RDCS, RDMS   ?

## 2022-01-11 NOTE — Progress Notes (Signed)
Inpatient Rehab Admissions Coordinator:  ? ?Per therapy recommendations,  patient was screened for CIR candidacy by Antario Yasuda, MS, CCC-SLP. At this time, Pt. Appears to be a a potential candidate for CIR. I will place   order for rehab consult per protocol for full assessment. Please contact me any with questions. ? ?Rosita Guzzetta, MS, CCC-SLP ?Rehab Admissions Coordinator  ?[phone] (celll) ?[phone] (office) ? ?

## 2022-01-12 DIAGNOSIS — Z8679 Personal history of other diseases of the circulatory system: Secondary | ICD-10-CM | POA: Diagnosis not present

## 2022-01-12 DIAGNOSIS — Z87898 Personal history of other specified conditions: Secondary | ICD-10-CM | POA: Diagnosis not present

## 2022-01-12 DIAGNOSIS — I611 Nontraumatic intracerebral hemorrhage in hemisphere, cortical: Secondary | ICD-10-CM | POA: Diagnosis not present

## 2022-01-12 LAB — BASIC METABOLIC PANEL
Anion gap: 7 (ref 5–15)
BUN: 14 mg/dL (ref 8–23)
CO2: 24 mmol/L (ref 22–32)
Calcium: 8.9 mg/dL (ref 8.9–10.3)
Chloride: 108 mmol/L (ref 98–111)
Creatinine, Ser: 0.65 mg/dL (ref 0.44–1.00)
GFR, Estimated: >60 mL/min (ref >60)
Glucose, Bld: 90 mg/dL (ref 70–99)
Potassium: 3.9 mmol/L (ref 3.5–5.1)
Sodium: 139 mmol/L (ref 135–145)

## 2022-01-12 LAB — CBC
HCT: 36.8 % (ref 36.0–46.0)
Hemoglobin: 12.9 g/dL (ref 12.0–15.0)
MCH: 31 pg (ref 26.0–34.0)
MCHC: 35.1 g/dL (ref 30.0–36.0)
MCV: 88.5 fL (ref 80.0–100.0)
Platelets: 246 10*3/uL (ref 150–400)
RBC: 4.16 MIL/uL (ref 3.87–5.11)
RDW: 12.8 % (ref 11.5–15.5)
WBC: 8.7 10*3/uL (ref 4.0–10.5)
nRBC: 0 % (ref 0.0–0.2)

## 2022-01-12 MED ORDER — LEVETIRACETAM IN NACL 500 MG/100ML IV SOLN
500.0000 mg | Freq: Once | INTRAVENOUS | Status: AC
  Administered 2022-01-12: 500 mg via INTRAVENOUS
  Filled 2022-01-12: qty 100

## 2022-01-12 MED ORDER — LEVETIRACETAM IN NACL 1000 MG/100ML IV SOLN
1000.0000 mg | Freq: Two times a day (BID) | INTRAVENOUS | Status: DC
  Administered 2022-01-12 – 2022-01-16 (×9): 1000 mg via INTRAVENOUS
  Filled 2022-01-12 (×9): qty 100

## 2022-01-12 NOTE — Progress Notes (Signed)
EEG maintenance complete. Patient had all frontal leads off. No skin break down. Patient was sitting up eating dinner during LTM maintenance.  ?

## 2022-01-12 NOTE — Progress Notes (Signed)
Physical Therapy Treatment ?Patient Details ?Name: Cynthia [name] ?MRN: [id number] ?DOB: [birth year] ?Today's Date: 01/12/2022 ? ? ?History of Present Illness 45 female admited with L side weakness and falling stiking head on night stand. pt found to have acute R frontal ICH. Pt with seizure-like activity afternoon of 5/2. PMH HTN,  R frontal ICH 2017 seizures Breast Ca depression diverticulitis, osteoporosis, CVA ? ?  ?PT Comments  ? ? Pt tolerates treatment well, ambulating for multiple short bouts. Ambulation distance limited by tachycardia into 140s. Pt with more prominent L inattention and increased L side tone this session resulting in increased difficulty utilizing LUE to assist in mobility. Pt will benefit from continued aggressive mobilization and acute PT services in an effort to improve mobility quality and reduce falls risk. PT continues to recommend AIR admission.   ?Recommendations for follow up therapy are one component of a multi-disciplinary discharge planning process, led by the attending physician.  Recommendations may be updated based on patient status, additional functional criteria and insurance authorization. ? ?Follow Up Recommendations ? Acute inpatient rehab (3hours/day) ?  ?  ?Assistance Recommended at Discharge PRN  ?Patient can return home with the following Two people to help with walking and/or transfers;Two people to help with bathing/dressing/bathroom;Assistance with cooking/housework;Assistance with feeding;Direct supervision/assist for medications management;Direct supervision/assist for financial management;Help with stairs or ramp for entrance;Assist for transportation ?  ?Equipment Recommendations ? Wheelchair (measurements PT);Wheelchair cushion (measurements PT);BSC/3in1;Other (comment) (hemi walker)  ?  ?Recommendations for Other Services   ? ? ?  ?Precautions / Restrictions Precautions ?Precautions: Fall;Other (comment) ?Precaution Comments: HR tachy to 140 with  mobility ?Restrictions ?Weight Bearing Restrictions: No  ?  ? ?Mobility ? Bed Mobility ?Overal bed mobility: Needs Assistance ?Bed Mobility: Supine to Sit ?  ?  ?Supine to sit: Max assist, +2 for physical assistance, HOB elevated ?  ?  ?General bed mobility comments: assist for BLE as well as to pivot hips. Pt with limited AROM of LUE, in decorticate positioning ?  ? ?Transfers ?Overall transfer level: Needs assistance ?Equipment used: 2 person hand held assist, 1 person hand held assist ?Transfers: Sit to/from Stand ?Sit to Stand: Max assist ?  ?  ?  ?  ?  ?General transfer comment: verbal cues and physical assist to establish widened BOS due to increased LLE tone. Pt with significant assist to lean to strong side and reduce opportunity for knee buckling. Verbal/tactile cues and physical assist to facilitate hip and trunk extension. Pt stands 3 times during session ?  ? ?Ambulation/Gait ?Ambulation/Gait assistance: Max assist, +2 physical assistance ?Gait Distance (Feet): 3 Feet (2 additional trials of 1' and 3') ?Assistive device: 2 person hand held assist ?Gait Pattern/deviations: Step-to pattern, Decreased step length - left, Decreased dorsiflexion - left, Decreased weight shift to left ?Gait velocity: reduced ?Gait velocity interpretation: <1.31 ft/sec, indicative of household ambulator ?  ?General Gait Details: pt with increased LLE adductor tone resulting in tendency to approach midline with swing phase. PT provides assist to direct LLE during swing phase as well as support to prevent L knee bukcle and to maintain L hip extension. OT providing assist to facilitate weight shift at trunk and hips. ? ? ?Stairs ?  ?  ?  ?  ?  ? ? ?Wheelchair Mobility ?  ? ?Modified Rankin (Stroke Patients Only) ?Modified Rankin (Stroke Patients Only) ?Pre-Morbid Rankin Score: No symptoms ?Modified Rankin: Moderately severe disability ? ? ?  ?Balance Overall balance assessment: Needs assistance ?Sitting-balance support: Single  extremity  supported, Feet supported ?Sitting balance-Leahy Scale: Poor ?Sitting balance - Comments: requires UE support as well as minG-minA ?Postural control: Right lateral lean ?Standing balance support: Bilateral upper extremity supported ?Standing balance-Leahy Scale: Zero ?Standing balance comment: maxA x 2 ?  ?  ?  ?  ?  ?  ?  ?  ?  ?  ?  ?  ? ?  ?Cognition Arousal/Alertness: Awake/alert ?Behavior During Therapy: [facility] for tasks assessed/performed ?Overall Cognitive Status: Impaired/Different from baseline ?Area of Impairment: Awareness, Safety/judgement ?  ?  ?  ?  ?  ?  ?  ?  ?  ?  ?  ?  ?Safety/Judgement: Decreased awareness of safety, Decreased awareness of deficits ?Awareness: Emergent ?  ?  ?  ?  ? ?  ?Exercises General Exercises - Lower Extremity ?Ankle Circles/Pumps: Right, 5 reps, Left (encouraged attempts with LLE although minimal AROM noted) ? ?  ?General Comments General comments (skin integrity, edema, etc.): tachycardic to 140s with mobility. Pt with more significant L inattention compared to evaluation. Education provided to family to encourage attention to left side ?  ?  ? ?Pertinent Vitals/Pain Pain Assessment ?Pain Assessment: No/denies pain  ? ? ?Home Living   ?  ?  ?  ?  ?  ?  ?  ?  ?  ?   ?  ?Prior Function    ?  ?  ?   ? ?PT Goals (current goals can now be found in the care plan section) Acute Rehab PT Goals ?Patient Stated Goal: to get back to baseline ?Progress towards PT goals: Not progressing toward goals - comment (some regression noted, increased assistance, increased tone in LUE, more prominent L inattention) ? ?  ?Frequency ? ? ? Min 4X/week ? ? ? ?  ?PT Plan Current plan remains appropriate  ? ? ?Co-evaluation PT/OT/SLP Co-Evaluation/Treatment: Yes ?Reason for Co-Treatment: Complexity of the patient's impairments (multi-system involvement);Necessary to address cognition/behavior during functional activity;For patient/therapist safety;To address functional/ADL transfers ?PT goals  addressed during session: Mobility/safety with mobility;Balance;Strengthening/ROM ?  ?  ? ?  ?AM-PAC PT "6 Clicks" Mobility   ?Outcome Measure ? Help needed turning from your back to your side while in a flat bed without using bedrails?: A Lot ?Help needed moving from lying on your back to sitting on the side of a flat bed without using bedrails?: Total ?Help needed moving to and from a bed to a chair (including a wheelchair)?: Total ?Help needed standing up from a chair using your arms (e.g., wheelchair or bedside chair)?: Total ?Help needed to walk in hospital room?: Total ?Help needed climbing 3-5 steps with a railing? : Total ?6 Click Score: 7 ? ?  ?End of Session Equipment Utilized During Treatment: Gait belt ?Activity Tolerance: Patient tolerated treatment well ?Patient left: in chair;with call bell/phone within reach;with chair alarm set;with family/visitor present ?Nurse Communication: Mobility status ?PT Visit Diagnosis: Unsteadiness on feet (R26.81);Other abnormalities of gait and mobility (R26.89);Difficulty in walking, not elsewhere classified (R26.2) ?  ? ? ?Time: 5638-9373 ?PT Time Calculation (min) (ACUTE ONLY): 39 min ? ?Charges:  $Gait Training: 8-22 mins ?$Therapeutic Activity: 8-22 mins          ?          ? ?Zenaida Niece, PT, DPT ?Acute Rehabilitation ?Pager: [phone] ?Office [phone] ? ? ? ?Zenaida Niece ?01/12/2022, 1:53 PM ? ?

## 2022-01-12 NOTE — TOC CAGE-AID Note (Signed)
Transition of Care (TOC) - CAGE-AID Screening ? ? ?Patient Details  ?Name: Geniene List ?MRN: [id number] ?Date of Birth: Jan 30, 1946 ? ?Transition of Care (TOC) CM/SW Contact:    ?Owen Pratte C Tarpley-Carter, LCSWA ?Phone Number: ?01/12/2022, 2:29 PM ? ? ?Clinical Narrative: ?Pt participated in [place].  Pt stated she does not use substance or ETOH.  Pt was not offered resources, due to no usage of substance or ETOH.   ? ?Passenger transport manager, MSW, LCSW-A ?Pronouns:  She/Her/Hers ?[facility] ?Clinical Social Worker ?Direct Number:  [phone] ?[email or web address] ? ?CAGE-AID Screening: ?  ? ?Have You Ever Felt You Ought to Cut Down on Your Drinking or Drug Use?: No ?Have People Annoyed You By Critizing Your Drinking Or Drug Use?: No ?Have You Felt Bad Or Guilty About Your Drinking Or Drug Use?: No ?Have You Ever Had a Drink or Used Drugs First Thing In The Morning to Steady Your Nerves or to Get Rid of a Hangover?: No ?CAGE-AID Score: 0 ? ?Substance Abuse Education Offered: No ? ?  ? ? ? ? ? ? ?

## 2022-01-12 NOTE — Progress Notes (Signed)
Inpatient Rehab Admissions Coordinator:  ? ? I spoke with Pt. At bedside and son over the phone regarding potential CIR admit. They are interested and family can provide 24/7 support. I will open a case with her insurance and pursue for potential admit pending insurance auth and medical readiness. ? ?Clemens Catholic, MS, CCC-SLP ?Rehab Admissions Coordinator  ?[phone] (celll) ?[phone] (office) ? ?

## 2022-01-12 NOTE — Procedures (Signed)
Patient Name: Cynthia [name]  ?MRN: [id number]  ?Epilepsy Attending: Lora Havens  ?Referring Physician/Provider: Rosalin Hawking, MD ?Duration: 01/11/2022 1555 to 01/12/2022 1555 ?  ?Patient history: 20 old female had an episode of left arm shaking followed by generalized tonic-clonic seizure.  EEG evaluate for seizure. ?  ?Level of alertness: awake, Asleep ?  ?AEDs during EEG study: Keppra ?  ?Technical aspects: This EEG study was done with scalp electrodes positioned according to the 10-20 International system of electrode placement. Electrical activity was acquired at a sampling rate of '500Hz'$  and reviewed with a high frequency filter of '70Hz'$  and a low frequency filter of '1Hz'$ . EEG data were recorded continuously and digitally stored.  ?  ?Description: The posterior dominant rhythm consists of 8-9 Hz activity of moderate voltage (25-35 uV) seen predominantly in posterior head regions, symmetric and reactive to eye opening and eye closing. Sleep was characterized by vertex waves, sleep spindles (12 to 14 Hz), maximal frontocentral region. EEG showed continuous generalized and maximal right frontotemporal region 5-'6hz'$  theta-delta slowing. Hyperventilation and photic stimulation were not performed.    ? ?Seizure without clinical signs were noted on 01/11/2022 after 2100 on 01/11/2022, average 2 seizure/hour lasting 1-2 minutes each. During the seizure, EEG initially showed 4 to 5 Hz theta-delta slowing in right frontotemporal region which then involved all of right hemisphere and left hemisphere and evolved into 2 to 3 Hz delta slowing.  Last seizure was noted on 01/12/2022 at 1218 ?  ?ABNORMALITY ?- Seizure without clinical signs, right frontotemporal region ?- Continuous slow, generalized and maximal right frontotemporal region  ?  ?IMPRESSION: ?This study showed seizures without clinical signs, average 2 seizures per hour, lasting about 1 to 2 minutes each arising from right frontotemporal region.  Last seizure was noted  on 01/12/2022 at 1218. Aditionally there is cortical dysfunction in right frontotemporal region likely secondary to underlying ICH.   ? ?Lora Havens   ? ?

## 2022-01-12 NOTE — Progress Notes (Signed)
Orthopedic Tech Progress Note ?Patient Details:  ?Cynthia [name] ?Mar 11, 1946 ?[id number] ? ?Patient was sitting up in the chair with her , showed RN how to apply PRAFO BOOT when patient gets in bed..  ? ? Ortho Devices ?Type of Ortho Device: Prafo boot/shoe ?Ortho Device/Splint Location: LLE ?Ortho Device/Splint Interventions: Ordered ?  ?Post Interventions ?Patient Tolerated: Well ?Instructions Provided: Care of device ? ?[name] ?01/12/2022, 2:37 PM ? ?

## 2022-01-12 NOTE — Progress Notes (Signed)
Dr. Cheral Marker informed that pt's repeat MRI would be later d/t EEG not being compatible  ?

## 2022-01-12 NOTE — PMR Pre-admission (Shared)
PMR Admission Coordinator Pre-Admission Assessment ? ?Patient: Cynthia [name] is an 76 y.o., female ?MRN: [id number] ?DOB: December 24, 1945 ?Height: 5' 6"  (167.6 cm) ?Weight: 59.6 kg ? ?Insurance Information ?HMO:  PPO:   yes   PCP:      IPA:      80/20:      OTHER:  ?PRIMARY: [employer]      Policy#: [id number]      Subscriber: Pt.  ?CM Name: Rubin Payor      Phone#: [phone] ext [phone]     Fax#: [phone] ?Pre-Cert#: [id number] approved for 7 days with f/u with Loraine phone ext [phone] and fax [phone]     Employer:  ?Eff Date: 09/13/2019- still active  ?Deductible: does not have  ?OOP Max: $4,000 ($100 met)  ?CIR: $160/day co-pay with a max co-pay of $1,600/admission (10 days)  ?SNF: 100% coverage  ?Outpatient:  $20/visit co-pay  ?Home Health:  100% coverage  ?DME: 80% coverage; 20% co-insurance  ?Providers: In network ? ?SECONDARY:       Policy#:      Phone#:  ? ?Financial Counselor:       Phone#:  ? ?The ?Data Collection Information Summary? for patients in Inpatient Rehabilitation Facilities with attached ?Privacy Act [place] Records? was provided and verbally reviewed with: Patient ? ?Emergency Contact Information ?Contact Information   ? ? Name Relation Home Work Mobile  ? Carson,Matthew Son   [phone]  ? Ferol Luz Son   [phone]  ? ?  ? ?Current Medical History  ?Patient Admitting Diagnosis: Seizure, ICH ? ?History of Present Illness: Chelbie Jarnagin is a 76 year old right-handed female with history of hypertension, right frontal ICH 2017, breast cancer with lumpectomy with radioactive seed localization 08/28/2020.  Per chart review patient lives with her children.  Bed bath main level of home with 4 steps to entry.  Son works third shift at home during the day.  She is a retired Pharmacist, hospital reportedly independent prior to admission.  Presented 01/10/2022 with acute onset of left-sided weakness and slid out of bed.  She attempted to stand hitting the back of her head without loss  of consciousness.  Cranial CT scan showed intraparenchymal hematoma in the right parasagittal frontal lobe measuring 3.0 cm AP by 2.3 cm TV by 3.0 cm with mild surrounding edema but no midline shift.  CT cervical spine maxillofacial negative.  Initial EEG negative.  Overnight EEG with video showed seizure arising from right frontal temporal region.  Admission chemistry unremarkable except glucose 108, WBC 12,700.  Echocardiogram ejection fraction of 60 to 65% no wall motion abnormalities.  Patient has been loaded with Keppra for seizure prophylaxis.  Follow-up MRI of the brain as well as angio head and neck showed acute/subacute hematoma right pericallosal frontal lobe unchanged.  MRA negative.  Tolerating a regular diet.   ? ?Patient's medical record from [facility]  has been reviewed by the rehabilitation admission coordinator and physician. ? ?Past Medical History  ?Past Medical History:  ?Diagnosis Date  ? Breast cancer ([place])   ? Colon polyps   ? Depression   ? history of  ? Diverticulitis   ? GERD (gastroesophageal reflux disease)   ? Hypertension   ? Osteoporosis   ? Seizures ([place])   ? Stroke [facility])   ? ?Has the patient had major surgery during 100 days prior to admission? Yes ? ?Family History   ?family history includes Cancer in her mother; Heart disease in her mother; Stomach cancer in her maternal grandfather; Stroke  in her paternal grandmother. ? ?Current Medications ? ?Current Facility-Administered Medications:  ?  acetaminophen (TYLENOL) tablet 650 mg, 650 mg, Oral, Q4H PRN, 650 mg at 01/16/22 1002 **OR** acetaminophen (TYLENOL) 160 MG/5ML solution 650 mg, 650 mg, Per Tube, Q4H PRN **OR** acetaminophen (TYLENOL) suppository 650 mg, 650 mg, Rectal, Q4H PRN, Rosalin Hawking, MD ?  docusate sodium (COLACE) capsule 100 mg, 100 mg, Oral, Daily, Danford, Suann Larry, MD, 100 mg at 01/17/22 1035 ?  labetalol (NORMODYNE) injection 5-20 mg, 5-20 mg, Intravenous, Q2H PRN, Rosalin Hawking, MD ?   levETIRAcetam (KEPPRA) tablet 1,000 mg, 1,000 mg, Oral, BID, Danford, Suann Larry, MD, 1,000 mg at 01/17/22 1035 ?  LORazepam (ATIVAN) injection 2 mg, 2 mg, Intravenous, PRN, Rosalin Hawking, MD ?  losartan (COZAAR) tablet 100 mg, 100 mg, Oral, Daily, Rosalin Hawking, MD, 100 mg at 01/17/22 1035 ?  pantoprazole (PROTONIX) EC tablet 40 mg, 40 mg, Oral, Daily, Rosalin Hawking, MD, 40 mg at 01/17/22 1035 ?  venlafaxine XR (EFFEXOR-XR) 24 hr capsule 75 mg, 75 mg, Oral, Q breakfast, Pham, Minh Q, RPH-CPP, 75 mg at 01/17/22 0825 ?  vitamin B-12 (CYANOCOBALAMIN) tablet 1,000 mcg, 1,000 mcg, Oral, Daily, Rosalin Hawking, MD, 1,000 mcg at 01/17/22 1035 ? ?Patients Current Diet:  ?Diet Order   ? ?       ?  Diet regular Room service appropriate? Yes with Assist; Fluid consistency: Thin  Diet effective now       ?  ? ?  ?  ? ?  ? ?Precautions / Restrictions ?Precautions ?Precautions: Fall ?Precaution Comments: monitor HR ?Restrictions ?Weight Bearing Restrictions: No ?Other Position/Activity Restrictions: tendency to stand on toes on L foot due to extension tone  ? ?Has the patient had 2 or more falls or a fall with injury in the past year? Yes ? ?Prior Activity Level ?Community (5-7x/wk): Pt. active in the community PTA ? ?Prior Functional Level ?Self Care: Did the patient need help bathing, dressing, using the toilet or eating? Independent ? ?Indoor Mobility: Did the patient need assistance with walking from room to room (with or without device)? Independent ? ?Stairs: Did the patient need assistance with internal or external stairs (with or without device)? Independent ? ?Functional Cognition: Did the patient need help planning regular tasks such as shopping or remembering to take medications? Independent ? ?Patient Information ?Are you of Hispanic, Latino/a,or Spanish origin?: A. No, not of Hispanic, Latino/a, or Spanish origin ?What is your race?: A. White ?Do you need or want an interpreter to communicate with a doctor or health care  staff?: 0. No ? ?Patient's Response To:  ?Health Literacy and Transportation ?Is the patient able to respond to health literacy and transportation needs?: Yes ?Health Literacy - How often do you need to have someone help you when you read instructions, pamphlets, or other written material from your doctor or pharmacy?: Never ?In the past 12 months, has lack of transportation kept you from medical appointments or from getting medications?: No ?In the past 12 months, has lack of transportation kept you from meetings, work, or from getting things needed for daily living?: No ? ?Home Assistive Devices / Equipment ?Home Assistive Devices/Equipment: None ?Home Equipment: None ? ?Prior Device Use: Indicate devices/aids used by the patient prior to current illness, exacerbation or injury? None of the above ? ?Current Functional Level ?Cognition ? Arousal/Alertness: Awake/alert ?Overall Cognitive Status: Impaired/Different from baseline ?Orientation Level: Oriented X4 ?Following Commands: Follows one step commands with increased time ?Safety/Judgement: Decreased awareness of  safety, Decreased awareness of deficits ?General Comments: pt with decreased awareness of deficits, wanting to walk this session despite LLE deficits. Pt with decreased initiation with task requiring v/cing throughout ?Attention: Selective ?Selective Attention: Impaired ?Selective Attention Impairment: Verbal basic ?Memory: Impaired ?Memory Impairment: Retrieval deficit, Decreased recall of new information ?Awareness: Impaired ?Awareness Impairment: Intellectual impairment, Emergent impairment ?Problem Solving: Impaired ?Problem Solving Impairment: Verbal complex ?Safety/Judgment: Impaired ?   ?Extremity Assessment ?(includes Sensation/Coordination) ? Upper Extremity Assessment: LUE deficits/detail ?LUE Deficits / Details: pt with ~40* shoulder flexion, weak grasp, frequently dropping items, difficulty with release ?LUE Sensation: decreased light touch,  decreased proprioception ?LUE Coordination: decreased fine motor, decreased gross motor  ?Lower Extremity Assessment: Defer to PT evaluation ?LLE Deficits / Details: Grossly 3+/5  ?  ?ADLs ? Overall ADL's : Ne

## 2022-01-12 NOTE — Progress Notes (Signed)
Occupational Therapy Treatment ?Patient Details ?Name: Cynthia [name] ?MRN: [id number] ?DOB: 11-21-1945 ?Today's Date: 01/12/2022 ? ? ?History of present illness 65 female admited with L side weakness and falling stiking head on night stand. pt found to have acute R frontal ICH. Pt with seizure-like activity afternoon of 5/2. PMH HTN,  R frontal ICH 2017 seizures Breast [facility] depression diverticulitis, osteoporosis, CVA ?  ?OT comments ? Pt introduces sister on therapists arrival and states "these are my physical therapists" showing recall from 01/11/22. Pt with increased HR, increased tone to L UE/ LE and decreased balance. Pt with strong R gaze preference this session. Pt hooked to continuous EEG. Pt states "I had a seizure and it was a bad one. I dont have them like that." Pt able to recall course of admission with details. Recommendation to CIR.   ? ?Recommendations for follow up therapy are one component of a multi-disciplinary discharge planning process, led by the attending physician.  Recommendations may be updated based on patient status, additional functional criteria and insurance authorization. ?   ?Follow Up Recommendations ? Acute inpatient rehab (3hours/day)  ?  ?Assistance Recommended at Discharge Set up Supervision/Assistance  ?Patient can return home with the following ? A lot of help with walking and/or transfers;A lot of help with bathing/dressing/bathroom;Assistance with cooking/housework;Assistance with feeding;Direct supervision/assist for medications management;Direct supervision/assist for financial management;Assist for transportation ?  ?Equipment Recommendations ? BSC/3in1  ?  ?Recommendations for Other Services Rehab consult ? ?  ?Precautions / Restrictions Precautions ?Precautions: Fall;Other (comment) ?Precaution Comments: HR tachy to 140 with mobility ?Restrictions ?Weight Bearing Restrictions: No  ? ? ?  ? ?Mobility Bed Mobility ?Overal bed mobility: Needs Assistance ?Bed Mobility: Supine  to Sit ?  ?  ?Supine to sit: Max assist, +2 for physical assistance, HOB elevated ?  ?  ?General bed mobility comments: assist for BLE as well as to pivot hips. Pt with limited AROM of LUE, in decorticate positioning ?  ? ?Transfers ?Overall transfer level: Needs assistance ?Equipment used: 2 person hand held assist, 1 person hand held assist ?Transfers: Sit to/from Stand ?Sit to Stand: Max assist ?  ?  ?  ?  ?  ?General transfer comment: verbal cues and physical assist to establish widened BOS due to increased LLE tone. Pt with significant assist to lean to strong side and reduce opportunity for knee buckling. Verbal/tactile cues and physical assist to facilitate hip and trunk extension. Pt stands 3 times during session ?  ?  ?Balance Overall balance assessment: Needs assistance ?Sitting-balance support: Single extremity supported, Feet supported ?Sitting balance-Leahy Scale: Poor ?Sitting balance - Comments: requires UE support as well as minG-minA ?Postural control: Right lateral lean ?Standing balance support: Bilateral upper extremity supported ?Standing balance-Leahy Scale: Zero ?Standing balance comment: maxA x 2 ?  ?  ?  ?  ?  ?  ?  ?  ?  ?  ?  ?   ? ?ADL either performed or assessed with clinical judgement  ? ?ADL Overall ADL's : Needs assistance/impaired ?Eating/Feeding: Minimal assistance;Bed level ?Eating/Feeding Details (indicate cue type and reason): needs setup and positioning to the R visual field ?  ?  ?  ?  ?  ?  ?  ?  ?Lower Body Dressing: Maximal assistance;Sit to/from stand ?  ?Toilet Transfer: +2 for physical assistance;Maximal assistance;Stand-pivot ?  ?  ?  ?  ?  ?  ?General ADL Comments: pt with increased L tone present and decreased basic transfer compared to 01/11/22 ?  ? ?  Extremity/Trunk Assessment Upper Extremity Assessment ?Upper Extremity Assessment: LUE deficits/detail ?LUE Deficits / Details: pt with increased tone and unable to abduct arm away from body. pt able to squeeze but unable  to release grasp. pt able to move first and second digit but keeps 3 4 5th digits flexed. pt with arm adducted to the body with 90 degree elbow flexion. OT to continue to monitor for possible pillow splint needs ?LUE Sensation: decreased light touch;decreased proprioception ?LUE Coordination: decreased fine motor;decreased gross motor ?  ?Lower Extremity Assessment ?Lower Extremity Assessment: Defer to PT evaluation ?  ?  ?  ? ?Vision   ?Vision Assessment?: Yes ?Eye Alignment: Within Functional Limits ?Alignment/Gaze Preference: Gaze right ?Additional Comments: pt with resting R gaze preference that is increased from evaluation. pt is able to scan to midline but once at rest back to the R visual field. ?  ?Perception Perception ?Perception: Impaired ?  ?Praxis   ?  ? ?Cognition Arousal/Alertness: Awake/alert ?Behavior During Therapy: [facility] for tasks assessed/performed ?Overall Cognitive Status: Impaired/Different from baseline ?Area of Impairment: Awareness, Safety/judgement ?  ?  ?  ?  ?  ?  ?  ?  ?  ?  ?  ?  ?Safety/Judgement: Decreased awareness of safety, Decreased awareness of deficits ?Awareness: Emergent ?  ?General Comments: pt with decreased awareness to L side this session showing increased L inattention compared to evaluation. ?  ?  ?   ?Exercises Other Exercises ?Other Exercises: attempts at basic transfer with adducted L LE with scissor noted. pt unable to abduct like previous session ?Other Exercises: p ? ?  ?Shoulder Instructions   ? ? ?  ?General Comments tachcardic 140s which is also a change from 01/11/22.  ? ? ?Pertinent Vitals/ Pain       Pain Assessment ?Pain Assessment: No/denies pain ? ?Home Living   ?  ?  ?  ?  ?  ?  ?  ?  ?  ?  ?  ?  ?Bathroom Accessibility: Yes ?How Accessible: Accessible via walker ?  ?  ?  ? Lives With: Son ? ?  ?Prior Functioning/Environment    ?  ?  ?  ?   ? ?Frequency ? Min 2X/week  ? ? ? ? ?  ?Progress Toward Goals ? ?OT Goals(current goals can now be found in the care  plan section) ? Progress towards OT goals: Progressing toward goals ? ?Acute Rehab OT Goals ?Patient Stated Goal: to get therapy ?OT Goal Formulation: With patient ?Time For Goal Achievement: 01/25/22 ?Potential to Achieve Goals: Good ?ADL Goals ?Pt Will Perform Grooming: with set-up;sitting ?Pt Will Transfer to Toilet: with mod assist;stand pivot transfer;bedside commode ?Pt/caregiver will Perform Home Exercise Program: Left upper extremity;Increased ROM;Increased strength;With Supervision;With written HEP provided ?Additional ADL Goal #1: pt will complete bed mobility supervision level as precursor to adls. ?Additional ADL Goal #2: Pt will complete basic transfer supervision level as precursor to adls  ?Plan Discharge plan remains appropriate   ? ?Co-evaluation ? ? ? PT/OT/SLP Co-Evaluation/Treatment: Yes ?Reason for Co-Treatment: Complexity of the patient's impairments (multi-system involvement);Necessary to address cognition/behavior during functional activity;For patient/therapist safety;To address functional/ADL transfers ?PT goals addressed during session: Mobility/safety with mobility;Balance;Strengthening/ROM ?OT goals addressed during session: ADL's and self-care;Proper use of Adaptive equipment and DME;Strengthening/ROM ?  ? ?  ?AM-PAC OT "6 Clicks" Daily Activity     ?Outcome Measure ? ? Help from another person eating meals?: A Lot ?Help from another person taking care of personal grooming?: A  Lot ?Help from another person toileting, which includes using toliet, bedpan, or urinal?: A Lot ?Help from another person bathing (including washing, rinsing, drying)?: A Lot ?Help from another person to put on and taking off regular upper body clothing?: A Lot ?Help from another person to put on and taking off regular lower body clothing?: A Lot ?6 Click Score: 12 ? ?  ?End of Session Equipment Utilized During Treatment: Gait belt ? ?OT Visit Diagnosis: Unsteadiness on feet (R26.81);Hemiplegia and  hemiparesis ?Hemiplegia - Right/Left: Left ?Hemiplegia - dominant/non-dominant: Non-Dominant ?Hemiplegia - caused by: Nontraumatic SAH ?  ?Activity Tolerance Patient tolerated treatment well ?  ?Patient Left in chair;

## 2022-01-12 NOTE — Progress Notes (Signed)
STROKE TEAM PROGRESS NOTE  ? ?SUBJECTIVE (INTERVAL HISTORY) ?No family at bedside.  Patient awake alert, orientated x3, no seizure overnight.  However left upper extremity weakness seems worsened than yesterday.  Still has left lower extremity weakness similar as yesterday.  Long-term EEG showed subclinical seizure 2/h ranging from right frontotemporal region.  Increase Keppra to thousand twice daily.  Continue EEG monitoring. ? ? ?OBJECTIVE ?Temp:  [97.4 ?F (36.3 ?C)-99.1 ?F (37.3 ?C)] 98.4 ?F (36.9 ?C) (05/03 1600) ?Pulse Rate:  [79-205] 97 (05/03 1800) ?Cardiac Rhythm: Normal sinus rhythm (05/03 0800) ?Resp:  [14-21] 19 (05/03 1800) ?BP: (107-153)/(52-109) 130/56 (05/03 1800) ?SpO2:  [92 %-100 %] 97 % (05/03 1800) ? ?Recent Labs  ?Lab 01/10/22 ?1132 01/11/22 ?1340  ?GLUCAP 95 165*  ? ?Recent Labs  ?Lab 01/10/22 ?1253 01/11/22 ?0249 01/12/22 ?0505  ?NA 141 139 139  ?K 4.0 3.4* 3.9  ?CL 107 105 108  ?CO2 '27 26 24  '$ ?GLUCOSE 108* 112* 90  ?BUN '17 18 14  '$ ?CREATININE 0.70 0.84 0.65  ?CALCIUM 9.2 9.1 8.9  ? ?Recent Labs  ?Lab 01/10/22 ?1253  ?AST 33  ?ALT 20  ?ALKPHOS 57  ?BILITOT 0.9  ?PROT 6.3*  ?ALBUMIN 3.8  ? ?Recent Labs  ?Lab 01/10/22 ?1253 01/11/22 ?0249 01/12/22 ?0505  ?WBC 9.1 6.8 8.7  ?NEUTROABS 7.2  --   --   ?HGB 13.2 12.7 12.9  ?HCT 38.3 37.7 36.8  ?MCV 90.1 89.8 88.5  ?PLT 256 270 246  ? ?No results for input(s): CKTOTAL, CKMB, CKMBINDEX, TROPONINI in the last 168 hours. ?Recent Labs  ?  01/10/22 ?1253  ?LABPROT 12.9  ?INR 1.0  ? ?No results for input(s): COLORURINE, LABSPEC, [place], GLUCOSEU, HGBUR, BILIRUBINUR, KETONESUR, PROTEINUR, UROBILINOGEN, NITRITE, LEUKOCYTESUR in the last 72 hours. ? ?Invalid input(s): APPERANCEUR  ?   ?Component Value Date/Time  ? CHOL 200 01/11/2022 0249  ? TRIG 92 01/11/2022 0249  ? HDL 88 01/11/2022 0249  ? CHOLHDL 2.3 01/11/2022 0249  ? VLDL 18 01/11/2022 0249  ? [place] 94 01/11/2022 0249  ? LDLCALC 101 (H) 05/25/2020 1111  ? ?Lab Results  ?Component Value Date  ? HGBA1C  5.7 (H) 01/11/2022  ? ?   ?Component Value Date/Time  ? LABOPIA NONE DETECTED 03/20/2016 1331  ? COCAINSCRNUR NONE DETECTED 03/20/2016 1331  ? LABBENZ NONE DETECTED 03/20/2016 1331  ? AMPHETMU NONE DETECTED 03/20/2016 1331  ? THCU NONE DETECTED 03/20/2016 1331  ? LABBARB NONE DETECTED 03/20/2016 1331  ?  ?No results for input(s): ETH in the last 168 hours. ? ?I have personally reviewed the radiological images below and agree with the radiology interpretations. ? ?CT HEAD WO CONTRAST (5MM) ? ?Result Date: 01/11/2022 ?CLINICAL DATA:  Seizure, generalized, abnormal neuro exam (Ped 0-17y) EXAM: CT HEAD WITHOUT CONTRAST TECHNIQUE: Contiguous axial images were obtained from the base of the skull through the vertex without intravenous contrast. RADIATION DOSE REDUCTION: This exam was performed according to the departmental dose-optimization program which includes automated exposure control, adjustment of the mA and/or kV according to patient size and/or use of iterative reconstruction technique. COMPARISON:  CT head Jan 10, 2022. FINDINGS: Brain: Stable size of an intraparenchymal hemorrhage in the right parasagittal frontal lobe. Similar surrounding edema and regional mass effect with possible slight extension of hemorrhage into the adjacent extra-axial space. No midline shift. Similar ventriculomegaly. No evidence of acute large vascular territory infarct. Patchy white matter hypoattenuation, nonspecific but compatible with chronic microvascular ischemic disease. Cerebral atrophy. Vascular: No hyperdense vessel identified. Skull:  No acute fracture. Sinuses/Orbits: Right maxillary sinus mucosal thickening frothy secretions. Otherwise, clear sinuses. No acute orbital findings. Other: No mastoid effusions. IMPRESSION: Unchanged high right parasagittal frontal lobe intraparenchymal hemorrhage with similar surrounding edema and mass effect. Electronically Signed   By: Margaretha Sheffield M.D.   On: 01/11/2022 14:08  ? ?CT CERVICAL  SPINE WO CONTRAST ? ?Result Date: 01/10/2022 ?CLINICAL DATA:  Neck trauma, uncomplicated (NEXUS/CCR neg) (Age 38-64y); Facial trauma, blunt EXAM: CT MAXILLOFACIAL WITHOUT CONTRAST CT CERVICAL SPINE WITHOUT CONTRAST TECHNIQUE: Multidetector CT imaging of the maxillofacial structures was performed. Multiplanar CT image reconstructions were also generated. A small metallic BB was placed on the right temple in order to reliably differentiate right from left. Multidetector CT imaging of the cervical spine was performed without intravenous contrast. Multiplanar CT image reconstructions were also generated. RADIATION DOSE REDUCTION: This exam was performed according to the departmental dose-optimization program which includes automated exposure control, adjustment of the mA and/or kV according to patient size and/or use of iterative reconstruction technique. COMPARISON:  Same day head CT, CT head and neck 03/10/2021 FINDINGS: CT MAXILLOFACIAL FINDINGS Osseous: No fracture or mandibular dislocation. No destructive process. Orbits: Negative. No traumatic or inflammatory finding. Sinuses: Small amount of frothy material in the right maxillary sinuses. The other paranasal sinuses are predominantly clear. Soft tissues: Mild left periorbital soft tissue swelling. Partially visualized intraparenchymal edema associated with an intraparenchymal hematoma as seen on separately dictated head CT for intracranial findings. CT CERVICAL SPINE FINDINGS Alignment: Trace degenerative anterolisthesis at C3-C4, retrolisthesis at C4-C5, and grade 1 anterolisthesis at C6-C7. Skull base and vertebrae: No acute cervical spine fracture no acute osseous lesion. Soft tissues and spinal canal: No prevertebral fluid or swelling. No visible canal hematoma. Disc levels: There is multilevel degenerative disc disease, mild at C3-C4, moderate at C4-C5, and severe at C5-C6. Multilevel facet arthropathy of varying degrees of severity. Upper chest: Unchanged 2.1  cm hypodense right thyroid nodule. Other: None IMPRESSION: No acute facial fracture. Mild left periorbital soft tissue swelling. No acute cervical spine fracture. Please see separately dictated head CT for intracranial findings. Electronically Signed   By: Maurine Simmering M.D.   On: 01/10/2022 12:26  ? ?EEG adult ? ?Result Date: 01/11/2022 ?Lora Havens, MD     01/11/2022  4:09 PM Patient Name: Cynthia [name] MRN: [id number] Epilepsy Attending: Lora Havens Referring Physician/Provider: Rosalin Hawking, MD Date: 01/11/2022 Duration: 21.36 mins Patient history: 60 old female had an episode of left arm shaking followed by generalized tonic-clonic seizure.  EEG evaluate for seizure. Level of alertness: Asleep AEDs during EEG study: Keppra, Ativan Technical aspects: This EEG study was done with scalp electrodes positioned according to the 10-20 International system of electrode placement. Electrical activity was acquired at a sampling rate of '500Hz'$  and reviewed with a high frequency filter of '70Hz'$  and a low frequency filter of '1Hz'$ . EEG data were recorded continuously and digitally stored. Description: Sleep was characterized by vertex waves, sleep spindles (12 to 14 Hz), maximal frontocentral region. EEG showed continuous 5-'6hz'$  theta-delta slowing in right hemisphere. Hyperventilation and photic stimulation were not performed.   ABNORMALITY - Continuous slow, right hemisphere IMPRESSION: This study during sleep only is suggestive of cortical dysfunction arising from right hemisphere likely secondary to underlying ICH. No seizures or epileptiform discharges were seen throughout the recording. Priyanka Barbra Sarks   ? ?Overnight EEG with video ? ?Result Date: 01/12/2022 ?Lora Havens, MD     01/12/2022  3:55 PM Patient Name: Kresha  Mesler MRN: [id number] Epilepsy Attending: Lora Havens Referring Physician/Provider: Rosalin Hawking, MD Duration: 01/11/2022 1555 to 01/12/2022 1555  Patient history: 39 old female had an episode of  left arm shaking followed by generalized tonic-clonic seizure.  EEG evaluate for seizure.  Level of alertness: awake, Asleep  AEDs during EEG study: Keppra  Technical aspects: This EEG study was don

## 2022-01-13 ENCOUNTER — Inpatient Hospital Stay (HOSPITAL_COMMUNITY): Payer: Medicare PPO

## 2022-01-13 DIAGNOSIS — Z8669 Personal history of other diseases of the nervous system and sense organs: Secondary | ICD-10-CM | POA: Diagnosis not present

## 2022-01-13 DIAGNOSIS — I611 Nontraumatic intracerebral hemorrhage in hemisphere, cortical: Secondary | ICD-10-CM | POA: Diagnosis not present

## 2022-01-13 DIAGNOSIS — Z8679 Personal history of other diseases of the circulatory system: Secondary | ICD-10-CM | POA: Diagnosis not present

## 2022-01-13 LAB — BASIC METABOLIC PANEL
Anion gap: 8 (ref 5–15)
BUN: 18 mg/dL (ref 8–23)
CO2: 24 mmol/L (ref 22–32)
Calcium: 8.8 mg/dL — ABNORMAL LOW (ref 8.9–10.3)
Chloride: 106 mmol/L (ref 98–111)
Creatinine, Ser: 0.65 mg/dL (ref 0.44–1.00)
GFR, Estimated: >60 mL/min (ref >60)
Glucose, Bld: 99 mg/dL (ref 70–99)
Potassium: 3.8 mmol/L (ref 3.5–5.1)
Sodium: 138 mmol/L (ref 135–145)

## 2022-01-13 LAB — CBC
HCT: 34.8 % — ABNORMAL LOW (ref 36.0–46.0)
Hemoglobin: 11.9 g/dL — ABNORMAL LOW (ref 12.0–15.0)
MCH: 30.4 pg (ref 26.0–34.0)
MCHC: 34.2 g/dL (ref 30.0–36.0)
MCV: 89 fL (ref 80.0–100.0)
Platelets: 250 10*3/uL (ref 150–400)
RBC: 3.91 MIL/uL (ref 3.87–5.11)
RDW: 12.8 % (ref 11.5–15.5)
WBC: 8 10*3/uL (ref 4.0–10.5)
nRBC: 0 % (ref 0.0–0.2)

## 2022-01-13 MED ORDER — POTASSIUM CHLORIDE CRYS ER 20 MEQ PO TBCR
40.0000 meq | EXTENDED_RELEASE_TABLET | Freq: Once | ORAL | Status: AC
  Administered 2022-01-13: 40 meq via ORAL
  Filled 2022-01-13: qty 2

## 2022-01-13 NOTE — Progress Notes (Signed)
Secure chatted EEG tech to replace EEG leads removed previously by patient; waiting for response. ?

## 2022-01-13 NOTE — Progress Notes (Signed)
IP rehab admissions - I met with patient and her family at the bedside.  We continue to await decision from [facility] regarding potential acute inpatient rehab admission.  I will follow up with all once I hear back from insurance case manager.  Call for questions.  [phone] ?

## 2022-01-13 NOTE — Progress Notes (Signed)
~  0030 EEG leads removed by patient. EEG tech notified, neuro notified. EEG replaced. ? ?0430 EEG leads removed again by patient. EEG tech notified; will apply mitts to patient. ?

## 2022-01-13 NOTE — Progress Notes (Signed)
Physical Therapy Treatment ?Patient Details ?Name: Quentin Strebel ?MRN: [id number] ?DOB: 11-May-1946 ?Today's Date: 01/13/2022 ? ? ?History of Present Illness 87 female admited with L side weakness and falling stiking head on night stand. pt found to have acute R frontal ICH. Pt with seizure-like activity afternoon of 5/2. PMH HTN,  R frontal ICH 2017 seizures Breast Ca depression diverticulitis, osteoporosis, CVA ? ?  ?PT Comments  ? ? Pt tolerates treatment well, focusing on transfer technique and setup. Pt demonstrates fair carryover within session of PT verbal cues and requires reduced physical assistance to stand at end of session. Pt also demonstrates improves movement of LUE during session although LLE remains very weak and continues to have significant plantar flexion tone. Pt will benefit from continued aggressive mobilization in an effort to improve mobility quality and reduce falls risk. PT continues to strongly recommend AIR admission.   ?Recommendations for follow up therapy are one component of a multi-disciplinary discharge planning process, led by the attending physician.  Recommendations may be updated based on patient status, additional functional criteria and insurance authorization. ? ?Follow Up Recommendations ? Acute inpatient rehab (3hours/day) ?  ?  ?Assistance Recommended at Discharge PRN  ?Patient can return home with the following Two people to help with walking and/or transfers;Two people to help with bathing/dressing/bathroom;Assistance with cooking/housework;Assistance with feeding;Direct supervision/assist for medications management;Direct supervision/assist for financial management;Help with stairs or ramp for entrance;Assist for transportation ?  ?Equipment Recommendations ? Wheelchair (measurements PT);Wheelchair cushion (measurements PT);BSC/3in1;Other (comment) (hemi-walker)  ?  ?Recommendations for Other Services   ? ? ?  ?Precautions / Restrictions Precautions ?Precautions:  Fall;Other (comment) ?Precaution Comments: monitor HR ?Restrictions ?Weight Bearing Restrictions: No  ?  ? ?Mobility ? Bed Mobility ?Overal bed mobility: Needs Assistance ?Bed Mobility: Supine to Sit ?  ?  ?Supine to sit: Mod assist, HOB elevated ?  ?  ?General bed mobility comments: increased time, cues to utilize R side to assist. Verbal cues for sequencing ?  ? ?Transfers ?Overall transfer level: Needs assistance ?Equipment used: 1 person hand held assist ?Transfers: Sit to/from Stand, Bed to chair/wheelchair/BSC ?Sit to Stand: Mod assist, Max assist ?  ?Step pivot transfers: Max assist ?  ?  ?  ?General transfer comment: majority of session focusing on cues to improve transfer technique including widened BOS, feet underneath knees and scooting to edge of chair, also increased forward lean when attempting to stand ?  ? ?Ambulation/Gait ?Ambulation/Gait assistance: Max assist ?Gait Distance (Feet): 2 Feet ?Assistive device: 1 person hand held assist ?Gait Pattern/deviations: Decreased dorsiflexion - left ?Gait velocity: reduced ?Gait velocity interpretation: <1.31 ft/sec, indicative of household ambulator ?  ?General Gait Details: pt with L ankle plantarflexion throughout gait cycle, reduced foot clearance bilaterally. PT provides weight shift to both sides in an effort to increase step length however remains limited ? ? ?Stairs ?  ?  ?  ?  ?  ? ? ?Wheelchair Mobility ?  ? ?Modified Rankin (Stroke Patients Only) ?Modified Rankin (Stroke Patients Only) ?Pre-Morbid Rankin Score: No symptoms ?Modified Rankin: Moderately severe disability ? ? ?  ?Balance Overall balance assessment: Needs assistance ?Sitting-balance support: Single extremity supported, Feet supported ?Sitting balance-Leahy Scale: Poor ?Sitting balance - Comments: modA initially progressing to close supervision at best, left lean ?Postural control: Left lateral lean ?Standing balance support: Bilateral upper extremity supported ?Standing balance-Leahy  Scale: Poor ?Standing balance comment: mod-maxA ?  ?  ?  ?  ?  ?  ?  ?  ?  ?  ?  ?  ? ?  ?  Cognition Arousal/Alertness: Awake/alert ?Behavior During Therapy: [facility] for tasks assessed/performed ?Overall Cognitive Status: Impaired/Different from baseline ?Area of Impairment: Awareness, Safety/judgement ?  ?  ?  ?  ?  ?  ?  ?  ?  ?  ?  ?  ?Safety/Judgement: Decreased awareness of deficits ?Awareness: Emergent ?  ?  ?  ?  ? ?  ?Exercises   ? ?  ?General Comments General comments (skin integrity, edema, etc.): pt asymptomatic during session ?  ?  ? ?Pertinent Vitals/Pain Pain Assessment ?Pain Assessment: No/denies pain  ? ? ?Home Living   ?  ?  ?  ?  ?  ?  ?  ?  ?  ?   ?  ?Prior Function    ?  ?  ?   ? ?PT Goals (current goals can now be found in the care plan section) Acute Rehab PT Goals ?Patient Stated Goal: to get back to baseline ?Progress towards PT goals: Progressing toward goals ? ?  ?Frequency ? ? ? Min 4X/week ? ? ? ?  ?PT Plan Current plan remains appropriate  ? ? ?Co-evaluation   ?  ?  ?  ?  ? ?  ?AM-PAC PT "6 Clicks" Mobility   ?Outcome Measure ? Help needed turning from your back to your side while in a flat bed without using bedrails?: A Lot ?Help needed moving from lying on your back to sitting on the side of a flat bed without using bedrails?: A Lot ?Help needed moving to and from a bed to a chair (including a wheelchair)?: A Lot ?Help needed standing up from a chair using your arms (e.g., wheelchair or bedside chair)?: A Lot ?Help needed to walk in hospital room?: Total ?Help needed climbing 3-5 steps with a railing? : Total ?6 Click Score: 10 ? ?  ?End of Session   ?Activity Tolerance: Patient tolerated treatment well ?Patient left: in chair;with call bell/phone within reach;with chair alarm set;with family/visitor present ?Nurse Communication: Mobility status ?PT Visit Diagnosis: Unsteadiness on feet (R26.81);Other abnormalities of gait and mobility (R26.89);Difficulty in walking, not elsewhere  classified (R26.2) ?  ? ? ?Time: 8416-6063 ?PT Time Calculation (min) (ACUTE ONLY): 44 min ? ?Charges:  $Gait Training: 8-22 mins ?$Therapeutic Activity: 23-37 mins          ?          ? ?Zenaida Niece, PT, DPT ?Acute Rehabilitation ?Pager: [phone] ?Office [phone] ? ? ? ?Zenaida Niece ?01/13/2022, 5:08 PM ? ?

## 2022-01-13 NOTE — Procedures (Signed)
Patient Name: Cynthia [name]  ?MRN: [id number]  ?Epilepsy Attending: Lora Havens  ?Referring Physician/Provider: Rosalin Hawking, MD ?Duration: 01/12/2022 1555 to 01/13/2022 6226 ?  ?Patient history: 71 old female had an episode of left arm shaking followed by generalized tonic-clonic seizure.  EEG evaluate for seizure. ?  ?Level of alertness: awake, Asleep ?  ?AEDs during EEG study: Keppra ?  ?Technical aspects: This EEG study was done with scalp electrodes positioned according to the 10-20 International system of electrode placement. Electrical activity was acquired at a sampling rate of '500Hz'$  and reviewed with a high frequency filter of '70Hz'$  and a low frequency filter of '1Hz'$ . EEG data were recorded continuously and digitally stored.  ?  ?Description: The posterior dominant rhythm consists of 8-9 Hz activity of moderate voltage (25-35 uV) seen predominantly in posterior head regions, symmetric and reactive to eye opening and eye closing. Sleep was characterized by vertex waves, sleep spindles (12 to 14 Hz), maximal frontocentral region. EEG showed continuous generalized and maximal right frontotemporal region 5-'6hz'$  theta-delta slowing. Hyperventilation and photic stimulation were not performed.    ? ?Of note, parts of study were difficult to interpret due to significant electrode artifact (patient removed electrodes multiple times even after reapplying electrodes and applying mittens) ?  ?ABNORMALITY ?- Continuous slow, generalized and maximal right frontotemporal region  ?  ?IMPRESSION: ?This technically difficult study is suggestive of cortical dysfunction in right frontotemporal region likely secondary to underlying ICH.  Additionally there is mild to moderate diffuse encephalopathy, non specific etiology. No definite seizure were seen during this study. ? ?Lora Havens  ?  ?

## 2022-01-13 NOTE — Evaluation (Addendum)
Speech Language Pathology Evaluation ?Patient Details ?Name: Cynthia [name] ?MRN: [id number] ?DOB: [birth year] ?Today's Date: 01/13/2022 ?Time: 1287-8676 ?SLP Time Calculation (min) (ACUTE ONLY): 43 min ? ?Problem List:  ?Patient Active Problem List  ? Diagnosis Date Noted  ? Ductal carcinoma in situ (DCIS) of left breast 07/23/2020  ? Partial symptomatic epilepsy with complex partial seizures, not intractable, without status epilepticus ([place]) 06/08/2017  ? Numbness and tingling 10/03/2016  ? Cognitive deficit due to recent stroke   ? Left hemiparesis ([place]) 03/29/2016  ? Left-sided visual neglect 03/29/2016  ? Vascular headache   ? Benign essential HTN   ? Hyponatremia   ? Acute blood loss anemia   ? TBI (traumatic brain injury) ([place])   ? Intracerebral hemorrhage 03/20/2016  ? ICH (intracerebral hemorrhage) ([place]) 03/20/2016  ? Colon polyps   ? Osteoporosis   ? HTN (hypertension) 01/08/2013  ? ?Past Medical History:  ?Past Medical History:  ?Diagnosis Date  ? Breast cancer ([place])   ? Colon polyps   ? Depression   ? history of  ? Diverticulitis   ? GERD (gastroesophageal reflux disease)   ? Hypertension   ? Osteoporosis   ? Seizures ([place])   ? Stroke [facility])   ? ?Past Surgical History:  ?Past Surgical History:  ?Procedure Laterality Date  ? BREAST LUMPECTOMY WITH RADIOACTIVE SEED LOCALIZATION Left 08/28/2020  ? Procedure: LEFT BREAST LUMPECTOMY WITH RADIOACTIVE SEED LOCALIZATION;  Surgeon: Stark Klein, MD;  Location: [place];  Service: General;  Laterality: Left;  RNFA  ? IR GENERIC HISTORICAL  08/05/2016  ? IR ANGIO VERTEBRAL SEL VERTEBRAL UNI R MOD SED 08/05/2016 Luanne Bras, MD [facility]-INTERV RAD  ? IR GENERIC HISTORICAL  08/05/2016  ? IR ANGIO INTRA EXTRACRAN SEL COM CAROTID INNOMINATE BILAT MOD SED 08/05/2016 Luanne Bras, MD [facility]-INTER[facility] RAD  ? IR GENERIC HISTORICAL  08/05/2016  ? IR ANGIO VERTEBRAL SEL SUBCLAVIAN INNOMINATE UNI L MOD SED 08/05/2016 Luanne Bras, MD [facility]INTER[facility] RAD  ? MOUTH SURGERY    ?  TONSILLECTOMY    ? TUBAL LIGATION    ? ?HPI:  ?26 female admited with L side weakness and falling stiking head on night stand. pt found to have acute R frontal ICH. Pt with seizure-like activity afternoon of 5/2. PMH: cognitive decline over the past year or so, HTN,  R frontal ICH 2017 (denies residuals s/p CIR and OP therapies), seizures, Breast Ca, depression, diverticulitis, osteoporosis, CVA  ? ?Assessment / Plan / Recommendation ?Clinical Impression ? Pt presents with appears to be acute on more chronic cognitive impairment, with pt and family acknowledging that she has had reduced recall over the past year or so, impacting her ability to remember things for medication management or keeping up with appointments. Today she also does not show intellectual or emergent awareness of physical or cognitive impairments. Memory is significantly decreased with recall of 0 out of 5 words after brief delay (increased to 4/5 with cueing), but also with impact on working memory during problem solving. She scored 17/30 on the SLUMS (27 and above considered to be [facility]). Pt would benefit from SLP f/u to maximize cognition, safety, and independence. ?   ?SLP Assessment ? SLP Recommendation/Assessment: Patient needs continued [place] [facility] Services ?SLP Visit Diagnosis: Cognitive communication deficit (R41.841)  ?  ?Recommendations for follow up therapy are one component of a multi-disciplinary discharge planning process, led by the attending physician.  Recommendations may be updated based on patient status, additional functional criteria and insurance authorization. ?   ?Follow  Up Recommendations ? Acute inpatient rehab (3hours/day)  ?  ?Assistance Recommended at Discharge ? Frequent or constant Supervision/Assistance  ?Functional Status Assessment Patient has had a recent decline in their functional status and demonstrates the ability to make significant improvements in function in a reasonable and predictable  amount of time.  ?Frequency and Duration min 2x/week  ?2 weeks ?  ?   ?SLP Evaluation ?Cognition ? Overall Cognitive Status: Impaired/Different from baseline ?Arousal/Alertness: Awake/alert ?Orientation Level: Oriented X4 ?Attention: Selective ?Selective Attention: Impaired ?Selective Attention Impairment: Verbal basic ?Memory: Impaired ?Memory Impairment: Retrieval deficit;Decreased recall of new information ?Awareness: Impaired ?Awareness Impairment: Intellectual impairment;Emergent impairment ?Problem Solving: Impaired ?Problem Solving Impairment: Verbal complex ?Safety/Judgment: Impaired  ?  ?   ?Comprehension ? Auditory Comprehension ?Overall Auditory Comprehension: Appears within functional limits for tasks assessed  ?  ?Expression Expression ?Primary Mode of Expression: Verbal ?Verbal Expression ?Overall Verbal Expression: Impaired ?Initiation: No impairment ?Level of Generative/Spontaneous Verbalization: Conversation ?Naming: Impairment ?Divergent: Other (comment) (10 words in 1 minute) ?Pragmatics: No impairment ?Non-Verbal Means of Communication: Not applicable   ?Oral / Motor ? Motor Speech ?Overall Motor Speech: Appears within functional limits for tasks assessed   ?        ? ?Osie Bond., M.A. CCC-SLP ?Acute Rehabilitation Services ?Office [phone] ? ?Secure chat preferred ? ?01/13/2022, 12:06 PM ? ?

## 2022-01-13 NOTE — Progress Notes (Signed)
LTM EEG discontinued - no skin breakdown at unhook.   

## 2022-01-13 NOTE — Progress Notes (Signed)
STROKE TEAM PROGRESS NOTE  ? ?SUBJECTIVE (INTERVAL HISTORY) ?Sister and son are at bedside.  Patient awake alert, sitting in bed, still has left-sided weakness, but fully orientated.  Overnight long-term EEG no more seizure.  Will DC EEG and proceed with MRI and MRA. ? ? ?OBJECTIVE ?Temp:  [98 ?F (36.7 ?C)-98.6 ?F (37 ?C)] 98.1 ?F (36.7 ?C) (05/04 1559) ?Pulse Rate:  [72-105] 90 (05/04 1559) ?Cardiac Rhythm: Normal sinus rhythm (05/04 1621) ?Resp:  [13-28] 16 (05/04 1559) ?BP: (98-145)/(54-100) 127/61 (05/04 1559) ?SpO2:  [91 %-97 %] 97 % (05/04 1559) ? ?Recent Labs  ?Lab 01/10/22 ?1132 01/11/22 ?1340  ?GLUCAP 95 165*  ? ?Recent Labs  ?Lab 01/10/22 ?1253 01/11/22 ?0249 01/12/22 ?0505 01/13/22 ?0353  ?NA 141 139 139 138  ?K 4.0 3.4* 3.9 3.8  ?CL 107 105 108 106  ?CO2 '27 26 24 24  '$ ?GLUCOSE 108* 112* 90 99  ?BUN '17 18 14 18  '$ ?CREATININE 0.70 0.84 0.65 0.65  ?CALCIUM 9.2 9.1 8.9 8.8*  ? ?Recent Labs  ?Lab 01/10/22 ?1253  ?AST 33  ?ALT 20  ?ALKPHOS 57  ?BILITOT 0.9  ?PROT 6.3*  ?ALBUMIN 3.8  ? ?Recent Labs  ?Lab 01/10/22 ?1253 01/11/22 ?0249 01/12/22 ?0505 01/13/22 ?0353  ?WBC 9.1 6.8 8.7 8.0  ?NEUTROABS 7.2  --   --   --   ?HGB 13.2 12.7 12.9 11.9*  ?HCT 38.3 37.7 36.8 34.8*  ?MCV 90.1 89.8 88.5 89.0  ?PLT 256 270 246 250  ? ?No results for input(s): CKTOTAL, CKMB, CKMBINDEX, TROPONINI in the last 168 hours. ?No results for input(s): LABPROT, INR in the last 72 hours. ? ?No results for input(s): COLORURINE, LABSPEC, [place], GLUCOSEU, HGBUR, BILIRUBINUR, KETONESUR, PROTEINUR, UROBILINOGEN, NITRITE, LEUKOCYTESUR in the last 72 hours. ? ?Invalid input(s): APPERANCEUR  ?   ?Component Value Date/Time  ? CHOL 200 01/11/2022 0249  ? TRIG 92 01/11/2022 0249  ? HDL 88 01/11/2022 0249  ? CHOLHDL 2.3 01/11/2022 0249  ? VLDL 18 01/11/2022 0249  ? [place] 94 01/11/2022 0249  ? LDLCALC 101 (H) 05/25/2020 1111  ? ?Lab Results  ?Component Value Date  ? HGBA1C 5.7 (H) 01/11/2022  ? ?   ?Component Value Date/Time  ? LABOPIA NONE  DETECTED 03/20/2016 1331  ? COCAINSCRNUR NONE DETECTED 03/20/2016 1331  ? LABBENZ NONE DETECTED 03/20/2016 1331  ? AMPHETMU NONE DETECTED 03/20/2016 1331  ? THCU NONE DETECTED 03/20/2016 1331  ? LABBARB NONE DETECTED 03/20/2016 1331  ?  ?No results for input(s): ETH in the last 168 hours. ? ?I have personally reviewed the radiological images below and agree with the radiology interpretations. ? ?CT HEAD WO CONTRAST (5MM) ? ?Result Date: 01/11/2022 ?CLINICAL DATA:  Seizure, generalized, abnormal neuro exam (Ped 0-17y) EXAM: CT HEAD WITHOUT CONTRAST TECHNIQUE: Contiguous axial images were obtained from the base of the skull through the vertex without intravenous contrast. RADIATION DOSE REDUCTION: This exam was performed according to the departmental dose-optimization program which includes automated exposure control, adjustment of the mA and/or kV according to patient size and/or use of iterative reconstruction technique. COMPARISON:  CT head Jan 10, 2022. FINDINGS: Brain: Stable size of an intraparenchymal hemorrhage in the right parasagittal frontal lobe. Similar surrounding edema and regional mass effect with possible slight extension of hemorrhage into the adjacent extra-axial space. No midline shift. Similar ventriculomegaly. No evidence of acute large vascular territory infarct. Patchy white matter hypoattenuation, nonspecific but compatible with chronic microvascular ischemic disease. Cerebral atrophy. Vascular: No hyperdense vessel identified. Skull: No acute  fracture. Sinuses/Orbits: Right maxillary sinus mucosal thickening frothy secretions. Otherwise, clear sinuses. No acute orbital findings. Other: No mastoid effusions. IMPRESSION: Unchanged high right parasagittal frontal lobe intraparenchymal hemorrhage with similar surrounding edema and mass effect. Electronically Signed   By: Margaretha Sheffield M.D.   On: 01/11/2022 14:08  ? ?CT CERVICAL SPINE WO CONTRAST ? ?Result Date: 01/10/2022 ?CLINICAL DATA:  Neck  trauma, uncomplicated (NEXUS/CCR neg) (Age 64-64y); Facial trauma, blunt EXAM: CT MAXILLOFACIAL WITHOUT CONTRAST CT CERVICAL SPINE WITHOUT CONTRAST TECHNIQUE: Multidetector CT imaging of the maxillofacial structures was performed. Multiplanar CT image reconstructions were also generated. A small metallic BB was placed on the right temple in order to reliably differentiate right from left. Multidetector CT imaging of the cervical spine was performed without intravenous contrast. Multiplanar CT image reconstructions were also generated. RADIATION DOSE REDUCTION: This exam was performed according to the departmental dose-optimization program which includes automated exposure control, adjustment of the mA and/or kV according to patient size and/or use of iterative reconstruction technique. COMPARISON:  Same day head CT, CT head and neck 03/10/2021 FINDINGS: CT MAXILLOFACIAL FINDINGS Osseous: No fracture or mandibular dislocation. No destructive process. Orbits: Negative. No traumatic or inflammatory finding. Sinuses: Small amount of frothy material in the right maxillary sinuses. The other paranasal sinuses are predominantly clear. Soft tissues: Mild left periorbital soft tissue swelling. Partially visualized intraparenchymal edema associated with an intraparenchymal hematoma as seen on separately dictated head CT for intracranial findings. CT CERVICAL SPINE FINDINGS Alignment: Trace degenerative anterolisthesis at C3-C4, retrolisthesis at C4-C5, and grade 1 anterolisthesis at C6-C7. Skull base and vertebrae: No acute cervical spine fracture no acute osseous lesion. Soft tissues and spinal canal: No prevertebral fluid or swelling. No visible canal hematoma. Disc levels: There is multilevel degenerative disc disease, mild at C3-C4, moderate at C4-C5, and severe at C5-C6. Multilevel facet arthropathy of varying degrees of severity. Upper chest: Unchanged 2.1 cm hypodense right thyroid nodule. Other: None IMPRESSION: No  acute facial fracture. Mild left periorbital soft tissue swelling. No acute cervical spine fracture. Please see separately dictated head CT for intracranial findings. Electronically Signed   By: Maurine Simmering M.D.   On: 01/10/2022 12:26  ? ?MR ANGIO HEAD WO CONTRAST ? ?Result Date: 01/13/2022 ?CLINICAL DATA:  Seizure.  Intracranial hemorrhage EXAM: MRI HEAD WITHOUT CONTRAST MRA HEAD WITHOUT CONTRAST TECHNIQUE: Multiplanar, multi-echo pulse sequences of the brain and surrounding structures were acquired without intravenous contrast. Angiographic images of the Circle of Willis were acquired using MRA technique without intravenous contrast. COMPARISON:  CT head 01/11/2022 FINDINGS: MRI HEAD FINDINGS Brain: Acute/subacute right frontal pericallosal hematoma is unchanged from the CT measuring approximately 3 cm in diameter. Mild mass-effect on the corpus callosum and right lateral ventricle. Chronic hemorrhagic infarct right medial parietal lobe. Generalized atrophy. Moderate chronic microvascular ischemic changes throughout the white matter and pons. No mass lesion identified. Negative for hydrocephalus. No midline shift. Vascular: Normal arterial flow voids. Skull and upper cervical spine: No focal skeletal abnormality. Sinuses/Orbits: Mild mucosal edema right maxillary sinus. Remaining sinuses clear. Negative orbit Other: None MRA HEAD FINDINGS Anterior circulation: Internal carotid artery widely patent through the cavernous segment. Anterior and middle cerebral arteries normal bilaterally. Posterior circulation: Posterior circulation widely patent without stenosis or occlusion. Fetal origin right posterior cerebral artery. Anatomic variants: Negative for aneurysm IMPRESSION: Acute/subacute hematoma right pericallosal frontal lobe unchanged from CT. Mild local mass-effect. Chronic hemorrhagic infarct right medial parietal lobe. Moderate chronic microvascular ischemic change in the white matter and pons. No acute ischemic  infarct. Negative MRA head Electronically Signed   By: Franchot Gallo M.D.   On: 01/13/2022 15:31  ? ?MR BRAIN WO CONTRAST ? ?Result Date: 01/13/2022 ?CLINICAL DATA:  Seizure.  Intracranial hemorrhage EXAM: MRI HEA

## 2022-01-14 DIAGNOSIS — I611 Nontraumatic intracerebral hemorrhage in hemisphere, cortical: Secondary | ICD-10-CM | POA: Diagnosis not present

## 2022-01-14 DIAGNOSIS — I629 Nontraumatic intracranial hemorrhage, unspecified: Secondary | ICD-10-CM | POA: Diagnosis not present

## 2022-01-14 DIAGNOSIS — I10 Essential (primary) hypertension: Secondary | ICD-10-CM

## 2022-01-14 DIAGNOSIS — E876 Hypokalemia: Secondary | ICD-10-CM | POA: Diagnosis not present

## 2022-01-14 DIAGNOSIS — E785 Hyperlipidemia, unspecified: Secondary | ICD-10-CM

## 2022-01-14 DIAGNOSIS — R569 Unspecified convulsions: Secondary | ICD-10-CM | POA: Diagnosis not present

## 2022-01-14 LAB — GLUCOSE, CAPILLARY
Glucose-Capillary: 103 mg/dL — ABNORMAL HIGH (ref 70–99)
Glucose-Capillary: 101 mg/dL — ABNORMAL HIGH (ref 70–99)
Glucose-Capillary: 173 mg/dL — ABNORMAL HIGH (ref 70–99)

## 2022-01-14 MED ORDER — DOCUSATE SODIUM 100 MG PO CAPS
100.0000 mg | ORAL_CAPSULE | Freq: Every day | ORAL | Status: DC
  Administered 2022-01-15 – 2022-01-17 (×3): 100 mg via ORAL
  Filled 2022-01-14 (×4): qty 1

## 2022-01-14 NOTE — Care Management Important Message (Signed)
Important Message ? ?Patient Details  ?Name: Cynthia [name] ?MRN: [id number] ?Date of Birth: 1945/12/10 ? ? ?Medicare Important Message Given:  Yes ? ? ? ? ?Namine Beahm ?01/14/2022, 4:15 PM ?

## 2022-01-14 NOTE — Progress Notes (Signed)
?   01/14/22 1330  ?Clinical Encounter Type  ?Visited With Patient and family together  ?Visit Type Initial;Other (Comment) ?(Advanced Directive)  ?Referral From Nurse  ?Consult/Referral To Chaplain  ? ?Chaplain responded to spiritual consult requesting advanced directive education.  ?Patient, Cynthia [name] went over the paperwork as well as her agent and son. They were comfortable with the questions asked and [name] assured them it something came up someone would be happy to return and work with them.  ? ?Danice Goltz  ?Chaplain  ?[facility] ?[phone] ? ?

## 2022-01-14 NOTE — Progress Notes (Signed)
Occupational Therapy Treatment ?Patient Details ?Name: Cynthia [name] ?MRN: [id number] ?DOB: Jan 25, 1946 ?Today's Date: 01/14/2022 ? ? ?History of present illness 81 female admited with L side weakness and falling stiking head on night stand. pt found to have acute R frontal ICH. Pt with seizure-like activity afternoon of 5/2. PMH HTN,  R frontal ICH 2017 seizures Breast Ca depression diverticulitis, osteoporosis, CVA ?  ?OT comments ? Pt. Seen for skilled OT treatment session.  Pt. Able to complete bed mobility and transfer from eob to recliner mod/max A.  Pt. Able to follow cues for sitting balance and L lean correction.  Pt. Motivated for therapy.  Agree with current d/c recommendations.   ? ?Recommendations for follow up therapy are one component of a multi-disciplinary discharge planning process, led by the attending physician.  Recommendations may be updated based on patient status, additional functional criteria and insurance authorization. ?   ?Follow Up Recommendations ? Acute inpatient rehab (3hours/day)  ?  ?Assistance Recommended at Discharge Set up Supervision/Assistance  ?Patient can return home with the following ? A lot of help with walking and/or transfers;A lot of help with bathing/dressing/bathroom;Assistance with cooking/housework;Assistance with feeding;Direct supervision/assist for medications management;Direct supervision/assist for financial management;Assist for transportation ?  ?Equipment Recommendations ? BSC/3in1  ?  ?Recommendations for Other Services Rehab consult ? ?  ?Precautions / Restrictions Precautions ?Precautions: Fall;Other (comment) ?Precaution Comments: monitor HR  ? ? ?  ? ?Mobility Bed Mobility ?Overal bed mobility: Needs Assistance ?Bed Mobility: Rolling, Sidelying to Sit ?Rolling: Min guard ?Sidelying to sit: Mod assist ?  ?  ?  ?General bed mobility comments: increased time, cues to utilize R side to assist. Verbal cues for sequencing ?  ? ?Transfers ?Overall transfer  level: Needs assistance ?Equipment used: 1 person hand held assist ?Transfers: Sit to/from Stand, Bed to chair/wheelchair/BSC ?Sit to Stand: Mod assist, Max assist ?Stand pivot transfers: Mod assist, Max assist ?  ?  ?  ?  ?  ?  ?  ?Balance   ?  ?  ?Sitting balance - Comments: modA initially progressing to close supervision at best, left lean-with cues able to correct and sit upright ?Postural control: Left lateral lean ?  ?  ?  ?  ?  ?  ?  ?  ?  ?  ?  ?  ?  ?  ?   ? ?ADL either performed or assessed with clinical judgement  ? ?ADL Overall ADL's : Needs assistance/impaired ?  ?  ?  ?  ?  ?  ?  ?  ?  ?  ?  ?  ?Toilet Transfer: Moderate assistance;Stand-pivot ?Toilet Transfer Details (indicate cue type and reason): observed with transfer from eob to recliner ?Toileting- Clothing Manipulation and Hygiene: Total assistance;Bed level ?Toileting - Clothing Manipulation Details (indicate cue type and reason): clean up after BM incontinence in the bed ?  ?  ?Functional mobility during ADLs: Moderate assistance;+2 for safety/equipment;+2 for physical assistance ?General ADL Comments: cna assisted with sit/stand from recliner for chair alarm placement ?  ? ?Extremity/Trunk Assessment   ?  ?  ?  ?  ?  ? ?Vision   ?  ?  ?Perception   ?  ?Praxis   ?  ? ?Cognition Arousal/Alertness: Awake/alert ?Behavior During Therapy: [facility] for tasks assessed/performed ?Overall Cognitive Status: Impaired/Different from baseline ?Area of Impairment: Awareness, Safety/judgement ?  ?  ?  ?  ?  ?  ?  ?  ?  ?  ?  ?  ?  Safety/Judgement: Decreased awareness of deficits ?Awareness: Emergent ?  ?General Comments: pt. with decreased insight into incontinence.  pt. was sitting in bed drinking coffee, agreeable to oob when i pulled back the covers she had had a large BM.  i said "oh i think you have had a BM we need to get you cleaned up" she said "yeah I know"  I said "why did you not let staff know you had to go or had gone to the b.room" she said "well my  son wasnt here.Marland Kitchenand" i said "were you aware that you had gone" she said "yes" but im not sure.  i said "did you feel the urgency that you had to go" she said she had.  i provided education that if she gets the feeling she should press the call button. i reviewed how to press the button for assistance.  she verbalized understanding.  later during session she alerted cna who was present that she may need to go and requested the bsc.  she looked at me and said "i felt it and i told her".  carry over noted. ?  ?  ?   ?Exercises   ? ?  ?Shoulder Instructions   ? ? ?  ?General Comments    ? ? ?Pertinent Vitals/ Pain       Pain Assessment ?Pain Assessment: No/denies pain ? ?Home Living   ?  ?  ?  ?  ?  ?  ?  ?  ?  ?  ?  ?  ?  ?  ?  ?  ?  ?  ? ?  ?Prior Functioning/Environment    ?  ?  ?  ?   ? ?Frequency ? Min 2X/week  ? ? ? ? ?  ?Progress Toward Goals ? ?OT Goals(current goals can now be found in the care plan section) ? Progress towards OT goals: Progressing toward goals ? ?   ?Plan Discharge plan remains appropriate   ? ?Co-evaluation ? ? ?   ?  ?  ?  ?  ? ?  ?AM-PAC OT "6 Clicks" Daily Activity     ?Outcome Measure ? ? Help from another person eating meals?: A Lot ?Help from another person taking care of personal grooming?: A Lot ?Help from another person toileting, which includes using toliet, bedpan, or urinal?: A Lot ?Help from another person bathing (including washing, rinsing, drying)?: A Lot ?Help from another person to put on and taking off regular upper body clothing?: A Lot ?Help from another person to put on and taking off regular lower body clothing?: A Lot ?6 Click Score: 12 ? ?  ?End of Session   ? ?OT Visit Diagnosis: Unsteadiness on feet (R26.81);Hemiplegia and hemiparesis ?Hemiplegia - Right/Left: Left ?Hemiplegia - dominant/non-dominant: Non-Dominant ?Hemiplegia - caused by: Nontraumatic SAH ?  ?Activity Tolerance Patient tolerated treatment well ?  ?Patient Left in chair;with call bell/phone within  reach;with chair alarm set ?  ?Nurse Communication Other (comment);Mobility status (pt. needed new pure wik placement. also reviewed safe transfer tech.) ?  ? ?   ? ?Time: 7412-8786 ?OT Time Calculation (min): 26 min ? ?Charges: OT General Charges ?$OT Visit: 1 Visit ?OT Treatments ?$Self Care/Home Management : 23-37 mins ? ?Sonia Baller, COTA/L ?Acute Rehabilitation ?[phone]  ? ?Cynthia [name] ?01/14/2022, 11:58 AM ?

## 2022-01-14 NOTE — Progress Notes (Signed)
?  Progress Note ? ? ?Patient: Camia Dipinto FBX:[id number] DOB: [birth year] DOA: 01/10/2022     4 ?DOS: the patient was seen and examined on 01/14/2022 at 9:28AM ?  ? ? ? ?Brief hospital course: ?Mrs. Allbright is a 76 y.o. F with hx ICH 2017, hx partial seizure no longer on Keppra who presented with acute LEFT weakness. ? ?In the ER, CT showed a left frontoparietal intraparenchymal hemorrhage. ? ? ?5/1: Admitted for IPH, BP normal, suspect CAA ?5/2: GTC seizure, loaded with Keppra ?5/3: LTM EEG showed subclinical seizure ?5/4: LTM EEG without seizure ? ? ? ? ?Assessment and Plan: ?* ICH (intracerebral hemorrhage) ([place]) ?- Hold aspirin and avoid anticoagulants ?- BP management ?- Avoid NSAIDs ? ?Hypokalemia ?Supplemented and resolved ? ?Hyperlipidemia ?- Resume statin at d/c ? ?Seizure ([place]) ?No further seizures ?- Continue Keppra ? ?Benign essential HTN ?BP controlled ?- Continue losartan ? ? ? ? ? ? ? ? ? ?Subjective: Patient has no complaints, she still has some left-sided weakness but this is stable, no change to her speech, no fever, no further seizures, no cough, no dyspnea. ? ? ? ? ?Physical Exam: ?Vitals:  ? 01/14/22 0500 01/14/22 0849 01/14/22 1237 01/14/22 1622  ?BP: 138/70 137/77 (!) 147/78 140/74  ?Pulse: 85 96 91 93  ?Resp: '16 17 19 20  '$ ?Temp: 98.3 ?F (36.8 ?C) 97.6 ?F (36.4 ?C) 98.5 ?F (36.9 ?C) 98.9 ?F (37.2 ?C)  ?TempSrc: Oral Oral Oral Oral  ?SpO2: 98% 98% 97% 97%  ?Weight:      ?Height:      ? ?Elderly adult female, sitting up in recliner, no acute distress ?RRR, no murmurs, no peripheral edema ?Respiratory effort normal, lungs clear without rales or wheezes ?Abdomen soft without tenderness palpation or guarding ?Attention normal, affect normal, just mild decrease strength in the left arm, maybe some mild facial droop, voice is normal, speech fluent ? ?Data Reviewed: ?Discussed with neurology, nursing notes reviewed, vital signs reviewed ?Labs are notable for basic metabolic panel with normal renal  function electrolytes ?Complete blood count normal ?Carotid ultrasound with less than 39% plaque in both carotids ?CT head showed a right intraparenchymal hemorrhage ? ? ?Family Communication:   ? ? ? ?Disposition: ?Status is: Inpatient ?To CIR ? ? ? ? ? ? ? ?Author: ?Edwin Dada, MD ?01/14/2022 5:24 PM ? ?For on call review [email or web address].  ? ? ?

## 2022-01-14 NOTE — Progress Notes (Signed)
STROKE TEAM PROGRESS NOTE  ? ?SUBJECTIVE (INTERVAL HISTORY) ?Son is at bedside.  Patient sitting in chair for lunch.  Awake alert, oriented x3.  No seizure overnight.  Still has left hemiparesis but seems improving.  PT/OT recommended CIR.  MRI stable hematoma but concerning for CAA.  MRI negative. ? ? ?OBJECTIVE ?Temp:  [97.6 ?F (36.4 ?C)-98.9 ?F (37.2 ?C)] 98.9 ?F (37.2 ?C) (05/05 1622) ?Pulse Rate:  [85-96] 93 (05/05 1622) ?Cardiac Rhythm: Normal sinus rhythm (05/05 0850) ?Resp:  [16-20] 20 (05/05 1622) ?BP: (120-147)/(62-78) 140/74 (05/05 1622) ?SpO2:  [95 %-98 %] 97 % (05/05 1622) ? ?Recent Labs  ?Lab 01/10/22 ?1132 01/11/22 ?1340 01/14/22 ?0615 01/14/22 ?1234 01/14/22 ?1628  ?GLUCAP 95 165* 103* 101* 173*  ? ?Recent Labs  ?Lab 01/10/22 ?1253 01/11/22 ?0249 01/12/22 ?0505 01/13/22 ?0353  ?NA 141 139 139 138  ?K 4.0 3.4* 3.9 3.8  ?CL 107 105 108 106  ?CO2 '27 26 24 24  '$ ?GLUCOSE 108* 112* 90 99  ?BUN '17 18 14 18  '$ ?CREATININE 0.70 0.84 0.65 0.65  ?CALCIUM 9.2 9.1 8.9 8.8*  ? ?Recent Labs  ?Lab 01/10/22 ?1253  ?AST 33  ?ALT 20  ?ALKPHOS 57  ?BILITOT 0.9  ?PROT 6.3*  ?ALBUMIN 3.8  ? ?Recent Labs  ?Lab 01/10/22 ?1253 01/11/22 ?0249 01/12/22 ?0505 01/13/22 ?0353  ?WBC 9.1 6.8 8.7 8.0  ?NEUTROABS 7.2  --   --   --   ?HGB 13.2 12.7 12.9 11.9*  ?HCT 38.3 37.7 36.8 34.8*  ?MCV 90.1 89.8 88.5 89.0  ?PLT 256 270 246 250  ? ?No results for input(s): CKTOTAL, CKMB, CKMBINDEX, TROPONINI in the last 168 hours. ?No results for input(s): LABPROT, INR in the last 72 hours. ? ?No results for input(s): COLORURINE, LABSPEC, [place], GLUCOSEU, HGBUR, BILIRUBINUR, KETONESUR, PROTEINUR, UROBILINOGEN, NITRITE, LEUKOCYTESUR in the last 72 hours. ? ?Invalid input(s): APPERANCEUR  ?   ?Component Value Date/Time  ? CHOL 200 01/11/2022 0249  ? TRIG 92 01/11/2022 0249  ? HDL 88 01/11/2022 0249  ? CHOLHDL 2.3 01/11/2022 0249  ? VLDL 18 01/11/2022 0249  ? [place] 94 01/11/2022 0249  ? LDLCALC 101 (H) 05/25/2020 1111  ? ?Lab Results  ?Component  Value Date  ? HGBA1C 5.7 (H) 01/11/2022  ? ?   ?Component Value Date/Time  ? LABOPIA NONE DETECTED 03/20/2016 1331  ? COCAINSCRNUR NONE DETECTED 03/20/2016 1331  ? LABBENZ NONE DETECTED 03/20/2016 1331  ? AMPHETMU NONE DETECTED 03/20/2016 1331  ? THCU NONE DETECTED 03/20/2016 1331  ? LABBARB NONE DETECTED 03/20/2016 1331  ?  ?No results for input(s): ETH in the last 168 hours. ? ?I have personally reviewed the radiological images below and agree with the radiology interpretations. ? ?CT HEAD WO CONTRAST (5MM) ? ?Result Date: 01/11/2022 ?CLINICAL DATA:  Seizure, generalized, abnormal neuro exam (Ped 0-17y) EXAM: CT HEAD WITHOUT CONTRAST TECHNIQUE: Contiguous axial images were obtained from the base of the skull through the vertex without intravenous contrast. RADIATION DOSE REDUCTION: This exam was performed according to the departmental dose-optimization program which includes automated exposure control, adjustment of the mA and/or kV according to patient size and/or use of iterative reconstruction technique. COMPARISON:  CT head Jan 10, 2022. FINDINGS: Brain: Stable size of an intraparenchymal hemorrhage in the right parasagittal frontal lobe. Similar surrounding edema and regional mass effect with possible slight extension of hemorrhage into the adjacent extra-axial space. No midline shift. Similar ventriculomegaly. No evidence of acute large vascular territory infarct. Patchy white matter hypoattenuation, nonspecific but compatible  with chronic microvascular ischemic disease. Cerebral atrophy. Vascular: No hyperdense vessel identified. Skull: No acute fracture. Sinuses/Orbits: Right maxillary sinus mucosal thickening frothy secretions. Otherwise, clear sinuses. No acute orbital findings. Other: No mastoid effusions. IMPRESSION: Unchanged high right parasagittal frontal lobe intraparenchymal hemorrhage with similar surrounding edema and mass effect. Electronically Signed   By: Margaretha Sheffield M.D.   On: 01/11/2022  14:08  ? ?CT CERVICAL SPINE WO CONTRAST ? ?Result Date: 01/10/2022 ?CLINICAL DATA:  Neck trauma, uncomplicated (NEXUS/CCR neg) (Age 66-64y); Facial trauma, blunt EXAM: CT MAXILLOFACIAL WITHOUT CONTRAST CT CERVICAL SPINE WITHOUT CONTRAST TECHNIQUE: Multidetector CT imaging of the maxillofacial structures was performed. Multiplanar CT image reconstructions were also generated. A small metallic BB was placed on the right temple in order to reliably differentiate right from left. Multidetector CT imaging of the cervical spine was performed without intravenous contrast. Multiplanar CT image reconstructions were also generated. RADIATION DOSE REDUCTION: This exam was performed according to the departmental dose-optimization program which includes automated exposure control, adjustment of the mA and/or kV according to patient size and/or use of iterative reconstruction technique. COMPARISON:  Same day head CT, CT head and neck 03/10/2021 FINDINGS: CT MAXILLOFACIAL FINDINGS Osseous: No fracture or mandibular dislocation. No destructive process. Orbits: Negative. No traumatic or inflammatory finding. Sinuses: Small amount of frothy material in the right maxillary sinuses. The other paranasal sinuses are predominantly clear. Soft tissues: Mild left periorbital soft tissue swelling. Partially visualized intraparenchymal edema associated with an intraparenchymal hematoma as seen on separately dictated head CT for intracranial findings. CT CERVICAL SPINE FINDINGS Alignment: Trace degenerative anterolisthesis at C3-C4, retrolisthesis at C4-C5, and grade 1 anterolisthesis at C6-C7. Skull base and vertebrae: No acute cervical spine fracture no acute osseous lesion. Soft tissues and spinal canal: No prevertebral fluid or swelling. No visible canal hematoma. Disc levels: There is multilevel degenerative disc disease, mild at C3-C4, moderate at C4-C5, and severe at C5-C6. Multilevel facet arthropathy of varying degrees of severity.  Upper chest: Unchanged 2.1 cm hypodense right thyroid nodule. Other: None IMPRESSION: No acute facial fracture. Mild left periorbital soft tissue swelling. No acute cervical spine fracture. Please see separately dictated head CT for intracranial findings. Electronically Signed   By: Maurine Simmering M.D.   On: 01/10/2022 12:26  ? ?MR ANGIO HEAD WO CONTRAST ? ?Result Date: 01/13/2022 ?CLINICAL DATA:  Seizure.  Intracranial hemorrhage EXAM: MRI HEAD WITHOUT CONTRAST MRA HEAD WITHOUT CONTRAST TECHNIQUE: Multiplanar, multi-echo pulse sequences of the brain and surrounding structures were acquired without intravenous contrast. Angiographic images of the Circle of Willis were acquired using MRA technique without intravenous contrast. COMPARISON:  CT head 01/11/2022 FINDINGS: MRI HEAD FINDINGS Brain: Acute/subacute right frontal pericallosal hematoma is unchanged from the CT measuring approximately 3 cm in diameter. Mild mass-effect on the corpus callosum and right lateral ventricle. Chronic hemorrhagic infarct right medial parietal lobe. Generalized atrophy. Moderate chronic microvascular ischemic changes throughout the white matter and pons. No mass lesion identified. Negative for hydrocephalus. No midline shift. Vascular: Normal arterial flow voids. Skull and upper cervical spine: No focal skeletal abnormality. Sinuses/Orbits: Mild mucosal edema right maxillary sinus. Remaining sinuses clear. Negative orbit Other: None MRA HEAD FINDINGS Anterior circulation: Internal carotid artery widely patent through the cavernous segment. Anterior and middle cerebral arteries normal bilaterally. Posterior circulation: Posterior circulation widely patent without stenosis or occlusion. Fetal origin right posterior cerebral artery. Anatomic variants: Negative for aneurysm IMPRESSION: Acute/subacute hematoma right pericallosal frontal lobe unchanged from CT. Mild local mass-effect. Chronic hemorrhagic infarct right medial parietal  lobe.  Moderate chronic microvascular ischemic change in the white matter and pons. No acute ischemic infarct. Negative MRA head Electronically Signed   By: Franchot Gallo M.D.   On: 01/13/2022 15:31  ? ?MR BRAIN WO CON

## 2022-01-14 NOTE — Assessment & Plan Note (Signed)
-   Supplemented and resolved 

## 2022-01-14 NOTE — Progress Notes (Signed)
Physical Therapy Treatment ?Patient Details ?Name: Cynthia [name] ?MRN: [id number] ?DOB: 08/13/1946 ?Today's Date: 01/14/2022 ? ? ?History of Present Illness 86 female admitted 5/1 with L side weakness and had fallen, striking head on night stand. pt found to have acute R frontal ICH. Pt had seizure-like activity afternoon of 5/2. PMHx:  HTN,  R frontal ICH 2017, seizures, Breast Ca depression, diverticulitis, osteoporosis, CVA ? ?  ?PT Comments  ? ? Pt was seen for mobility today with split session initially for standing balance control with postural correction, and then to work on LE strength.  Pt is accompanied by her son initially, and then is seen independently.  Her plan is still to ask for CIR placement to build on current functional progress with standing, to increase standing balance and improve wb posture of LLE.  Pt is being driven on L foot with extension tone, and will benefit from using any positioning on LLE that will allow heel wb.  Follow along with her to progress toward gait.    ?Recommendations for follow up therapy are one component of a multi-disciplinary discharge planning process, led by the attending physician.  Recommendations may be updated based on patient status, additional functional criteria and insurance authorization. ? ?Follow Up Recommendations ? Acute inpatient rehab (3hours/day) ?  ?  ?Assistance Recommended at Discharge Intermittent Supervision/Assistance  ?Patient can return home with the following Two people to help with walking and/or transfers;A lot of help with bathing/dressing/bathroom;Assistance with cooking/housework;Assistance with feeding;Direct supervision/assist for medications management;Direct supervision/assist for financial management;Assist for transportation;Help with stairs or ramp for entrance ?  ?Equipment Recommendations ? Wheelchair (measurements PT);Wheelchair cushion (measurements PT);BSC/3in1;Other (comment)  ?  ?Recommendations for Other Services Rehab  consult ? ? ?  ?Precautions / Restrictions Precautions ?Precautions: Fall ?Precaution Comments: monitor HR ?Restrictions ?Weight Bearing Restrictions: No ?Other Position/Activity Restrictions: tendency to stand on toes on L foot due to extension tone  ?  ? ?Mobility ? Bed Mobility ?  ?  ?  ?  ?  ?  ?  ?General bed mobility comments: up in recliner when PT arrived ?  ? ?Transfers ?Overall transfer level: Needs assistance ?Equipment used: 1 person hand held assist ?Transfers: Sit to/from Stand ?Sit to Stand: Mod assist ?  ?  ?  ?  ?  ?General transfer comment: pt cannot step effectively on LLE today, both a haptic and proprioceptive issue. ?  ? ?Ambulation/Gait ?Ambulation/Gait assistance: Max assist ?Gait Distance (Feet): 1 Feet ?Assistive device: 1 person hand held assist ?  ?Gait velocity: reduced ?Gait velocity interpretation: <1.31 ft/sec, indicative of household ambulator ?  ?General Gait Details: pt is tone driven to stand on L foot and cannot get to a functional wb posture on LLE to accept wgt on that foot ? ? ?Stairs ?  ?  ?  ?  ?  ? ? ?Wheelchair Mobility ?  ? ?Modified Rankin (Stroke Patients Only) ?  ? ? ?  ?Balance Overall balance assessment: Needs assistance ?Sitting-balance support: Feet supported ?Sitting balance-Leahy Scale: Poor ?Sitting balance - Comments: strong L side listing posture, but is effectively WB on L elbow on a pillow in the chair ?Postural control: Left lateral lean ?  ?  ?  ?  ?  ?  ?  ?  ?  ?  ?  ?  ?  ?  ?  ? ?  ?Cognition Arousal/Alertness: Awake/alert ?Behavior During Therapy: [facility] for tasks assessed/performed ?Overall Cognitive Status: Impaired/Different from baseline ?Area of Impairment: Problem solving,  Awareness, Safety/judgement ?  ?  ?  ?  ?  ?  ?  ?  ?  ?  ?  ?  ?Safety/Judgement: Decreased awareness of safety, Decreased awareness of deficits ?Awareness: Intellectual ?Problem Solving: Slow processing, Requires verbal cues, Requires tactile cues ?General Comments: pt is  unaware of the extent of her LLE support in standing, and has asked to take a walk when she cannot support on LLE yet.  Has tone that puts her into toe walking, and additionally cannot feel the position of joints on LLE yet.  Testing foot and toes indicates pt cannot tell how L foot is positioned to stand on it ?  ?  ? ?  ?Exercises General Exercises - Lower Extremity ?Ankle Circles/Pumps: AAROM, 5 reps ?Quad Sets: AROM, 10 reps ?Gluteal Sets: AROM, 10 reps ?Long Arc Quad: Strengthening, 10 reps ?Heel Slides: Strengthening, AROM, AAROM, 10 reps ?Hip ABduction/ADduction: Strengthening, AAROM, AROM, 10 reps ? ?  ?General Comments General comments (skin integrity, edema, etc.): worked in standing on turning to look R, leaning to R and correcting alignment to R side ?  ?  ? ?Pertinent Vitals/Pain Pain Assessment ?Pain Assessment: No/denies pain  ? ? ?Home Living   ?  ?  ?  ?  ?  ?  ?  ?  ?  ?   ?  ?Prior Function    ?  ?  ?   ? ?PT Goals (current goals can now be found in the care plan section) Acute Rehab PT Goals ?Patient Stated Goal: to get back to baseline ?Progress towards PT goals: Progressing toward goals ? ?  ?Frequency ? ? ? Min 4X/week ? ? ? ?  ?PT Plan Current plan remains appropriate  ? ? ?Co-evaluation   ?  ?  ?  ?  ? ?  ?AM-PAC PT "6 Clicks" Mobility   ?Outcome Measure ? Help needed turning from your back to your side while in a flat bed without using bedrails?: A Lot ?Help needed moving from lying on your back to sitting on the side of a flat bed without using bedrails?: A Lot ?Help needed moving to and from a bed to a chair (including a wheelchair)?: A Lot ?Help needed standing up from a chair using your arms (e.g., wheelchair or bedside chair)?: A Lot ?Help needed to walk in hospital room?: Total ?Help needed climbing 3-5 steps with a railing? : Total ?6 Click Score: 10 ? ?  ?End of Session Equipment Utilized During Treatment: Gait belt ?Activity Tolerance: Patient tolerated treatment well;Patient  limited by fatigue ?Patient left: in chair;with call bell/phone within reach;with chair alarm set;with family/visitor present ?Nurse Communication: Mobility status ?PT Visit Diagnosis: Unsteadiness on feet (R26.81);Other abnormalities of gait and mobility (R26.89);Difficulty in walking, not elsewhere classified (R26.2) ?  ? ? ?Time: 0175-1025 (+ [phone]) ?PT Time Calculation (min) (ACUTE ONLY): 18 min ? ?Charges:  $Therapeutic Exercise: 8-22 mins ?$Therapeutic Activity: 8-22 mins       ?Ramond Dial ?01/14/2022, 4:19 PM ? ?Mee Hives, PT PhD ?Acute Rehab Dept. Number: [facility] [phone] and [place] [phone] ? ? ?

## 2022-01-14 NOTE — H&P (Signed)
? ? ?Physical Medicine and Rehabilitation Admission H&P ? ?  ?Chief Complaint  ?Patient presents with  ? Code Stroke  ?: ?HPI: Cynthia [name] is a 76 year old right-handed female with history of hypertension, right frontal ICH 2017, breast cancer with lumpectomy with radioactive seed localization 08/28/2020.  Per chart review patient lives with her children.  Bed bath main level of home with 4 steps to entry.  Son works third shift at home during the day.  She is a retired Pharmacist, hospital reportedly independent prior to admission.  Presented 01/10/2022 with acute onset of left-sided weakness and slid out of bed.  She attempted to stand hitting the back of her head without loss of consciousness.  Cranial CT scan showed intraparenchymal hematoma in the right parasagittal frontal lobe measuring 3.0 cm AP by 2.3 cm TV by 3.0 cm with mild surrounding edema but no midline shift.  CT cervical spine maxillofacial negative.  Initial EEG negative.  Overnight EEG with video showed seizure arising from right frontal temporal region.  Admission chemistry unremarkable except glucose 108, WBC 12,700.  Echocardiogram ejection fraction of 60 to 65% no wall motion abnormalities.  Patient has been loaded with Keppra for seizure prophylaxis.  Follow-up MRI of the brain as well as angio head and neck showed acute/subacute hematoma right pericallosal frontal lobe unchanged.  MRA negative.  Tolerating a regular diet.  Therapy evaluations completed due to patient decreased functional mobility was admitted for a comprehensive rehab program. ? ?Pt admits to no more seizures since on keppra.  ?No pain; LBM this Am ?Using Purewick for voiding- voiding OK.  ?Generalized weakness ?Feels a little slower.  ? ? ?Review of Systems  ?Constitutional:  Negative for chills and fever.  ?HENT:  Negative for hearing loss.   ?Eyes:  Negative for blurred vision and double vision.  ?Respiratory:  Negative for cough and shortness of breath.   ?Cardiovascular:   Negative for chest pain, palpitations and leg swelling.  ?Gastrointestinal:  Positive for constipation. Negative for heartburn, nausea and vomiting.  ?     GERD  ?Genitourinary:  Negative for dysuria, flank pain and hematuria.  ?Musculoskeletal:  Positive for joint pain and myalgias.  ?Skin:  Negative for rash.  ?Neurological:  Positive for weakness.  ?Psychiatric/Behavioral:  Positive for depression and memory loss.   ?     Family reports decreased recall of the past year.  ?All other systems reviewed and are negative. ?Past Medical History:  ?Diagnosis Date  ? Breast cancer ([place])   ? Colon polyps   ? Depression   ? history of  ? Diverticulitis   ? GERD (gastroesophageal reflux disease)   ? Hypertension   ? Osteoporosis   ? Seizures ([place])   ? Stroke [facility])   ? ?Past Surgical History:  ?Procedure Laterality Date  ? BREAST LUMPECTOMY WITH RADIOACTIVE SEED LOCALIZATION Left 08/28/2020  ? Procedure: LEFT BREAST LUMPECTOMY WITH RADIOACTIVE SEED LOCALIZATION;  Surgeon: Stark Klein, MD;  Location: [facility];  Service: General;  Laterality: Left;  RNFA  ? IR GENERIC HISTORICAL  08/05/2016  ? IR ANGIO VERTEBRAL SEL VERTEBRAL UNI R MOD SED 08/05/2016 Luanne Bras, MD [facility]-INTERV RAD  ? IR GENERIC HISTORICAL  08/05/2016  ? IR ANGIO INTRA EXTRACRAN SEL COM CAROTID INNOMINATE BILAT MOD SED 08/05/2016 Luanne Bras, MD [facility]INTER[facility] RAD  ? IR GENERIC HISTORICAL  08/05/2016  ? IR ANGIO VERTEBRAL SEL SUBCLAVIAN INNOMINATE UNI L MOD SED 08/05/2016 Luanne Bras, MD [facility]INTER[facility]  ? MOUTH SURGERY    ?  TONSILLECTOMY    ? TUBAL LIGATION    ? ?Family History  ?Problem Relation Age of Onset  ? Heart disease Mother   ? Cancer Mother   ?     lung  ? Stroke Paternal Grandmother   ? Stomach cancer Maternal Grandfather   ? ?Social History:  reports that she has never smoked. She has never used smokeless tobacco. She reports current alcohol use of about 1.0 standard drink per week. She reports that she does not currently use  drugs. ?Allergies:  ?Allergies  ?Allergen Reactions  ? Other Anaphylaxis  ?  FIRE ANTS ?Insect stings / bites  ? Acyclovir And Related Other (See Comments)  ?  Unknown reaction  ? Codeine Other (See Comments)  ?  Syncope ?  ? Penicillins Rash  ?  Has patient had a PCN reaction causing immediate rash, facial/tongue/throat swelling, SOB or lightheadedness with hypotension: Unknown ?Has patient had a PCN reaction causing severe rash involving mucus membranes or skin necrosis: No ?Has patient had a PCN reaction that required hospitalization No ?Has patient had a PCN reaction occurring within the last 10 years: No ?If all of the above answers are "NO", then may proceed with Cephalosporin use. ?  ? ?Medications Prior to Admission  ?Medication Sig Dispense Refill  ? alendronate (FOSAMAX) 70 MG tablet TAKE 1 TABLET BY MOUTH EVERY 7 DAYS. TAKE WITH FULL GLASS OF WATER ON AN EMPTY STOMACH (Patient taking differently: Take 70 mg by mouth every Sunday. Take with full glass of water on an empty stomach.) 12 tablet 3  ? ALPRAZolam (XANAX) 0.5 MG tablet Take 1 tablet (0.5 mg total) by mouth 3 (three) times daily as needed for anxiety. 45 tablet 2  ? Calcium Carb-Cholecalciferol (CALCIUM CARBONATE-VITAMIN D3) 600-400 MG-UNIT TABS Take 1 tablet by mouth 2 (two) times daily.    ? Cetirizine HCl 10 MG TBDP Take 10 mg by mouth at bedtime. Aller-Tec (from [facility])    ? EPINEPHRINE 0.3 mg/0.3 mL IJ SOAJ injection USE AS DIRECTED (Patient taking differently: Inject 0.3 mg into the muscle as needed for anaphylaxis.) 2 each 1  ? famotidine (PEPCID) 20 MG tablet Take 1 tablet (20 mg total) by mouth 2 (two) times daily. 180 tablet 2  ? losartan (COZAAR) 100 MG tablet TAKE 1 TABLET BY MOUTH DAILY FOR BLOOD PRESSURE (Patient taking differently: Take 100 mg by mouth daily.) 90 tablet 2  ? venlafaxine XR (EFFEXOR-XR) 75 MG 24 hr capsule TAKE 1 CAPSULE BY MOUTH DAILY WITH BREAKFAST. (Patient taking differently: Take 75 mg by mouth daily with  breakfast.) 90 capsule 1  ? vitamin B-12 (CYANOCOBALAMIN) 1000 MCG tablet Take 1,000 mcg by mouth daily.    ? zinc gluconate 50 MG tablet Take 50 mg by mouth daily.    ? acetaminophen (TYLENOL) 500 MG tablet Take 1,000 mg by mouth every 6 (six) hours as needed for moderate pain or headache. (Patient not taking: Reported on 01/10/2022)    ? ferrous sulfate 325 (65 FE) MG tablet Take 325 mg by mouth daily. (Patient not taking: Reported on 01/10/2022)    ? ? ? ? ?Home: ?Home Living ?Family/patient expects to be discharged to:: Private residence ?Living Arrangements: Children ?Available Help at Discharge: Family ?Type of Home: House ?Home Access: Stairs to enter ?Entrance Stairs-Number of Steps: 4 (back door 4 steps with rails) ?Entrance Stairs-Rails: None ?Home Layout: Able to live on main level with bedroom/bathroom ?Bathroom Shower/Tub: Tub/shower unit ?Bathroom Toilet: Standard ?Bathroom Accessibility: Yes ?Home Equipment:  None ?Additional Comments: son works 3rd shift and home during the day ? Lives With: Son ?  ?Functional History: ?Prior Function ?Prior Level of Function : Independent/Modified Independent, Driving ?Mobility Comments: Retired Pharmacist, hospital, active ? ?Functional Status:  ?Mobility: ?Bed Mobility ?Overal bed mobility: Needs Assistance ?Bed Mobility: Supine to Sit ?Rolling: Min guard ?Sidelying to sit: Mod assist ?Supine to sit: Mod assist, HOB elevated ?General bed mobility comments: constant max directional verbal cues given. Pt would initiate LE movement off EOB and then stop requiring more verbal cues to keep going and then modA to scoot hips forward so feet were on the ground ?Transfers ?Overall transfer level: Needs assistance ?Equipment used: 1 person hand held assist ?Transfers: Sit to/from Stand ?Sit to Stand: Mod assist, Max assist ?Bed to/from chair/wheelchair/BSC transfer type:: Stand pivot ?Stand pivot transfers: Max assist ?Step pivot transfers: Max assist ?General transfer comment: pt resistant  due to fear, retropulsive. attempted with RW first however unable and completed fact to face transfer, assist to help pt move L foot to the BSC, trunk flexion and L foot into PF ?Ambulation/Gait ?Ambulation/Gait assistance:

## 2022-01-14 NOTE — Assessment & Plan Note (Signed)
BP controlled ?- Continue losartan ?

## 2022-01-14 NOTE — Assessment & Plan Note (Signed)
No further seizures ?- Continue Keppra ?

## 2022-01-14 NOTE — Assessment & Plan Note (Addendum)
-   Avoid statin due to CAA ?

## 2022-01-14 NOTE — Hospital Course (Addendum)
Cynthia [name] is a 76 y.o. F with hx ICH 2017, hx partial seizure no longer on Keppra who presented with acute LEFT weakness. ? ?In the ER, CT showed a left frontoparietal intraparenchymal hemorrhage. ? ? ?5/1: Admitted for IPH, BP normal, suspect CAA ?5/2: GTC seizure, loaded with Keppra ?5/3: LTM EEG showed subclinical seizure ?5/4: LTM EEG without seizure ?

## 2022-01-14 NOTE — Assessment & Plan Note (Signed)
-   Hold aspirin and avoid anticoagulants ?- BP management ?- Avoid NSAIDs ?

## 2022-01-14 NOTE — Progress Notes (Signed)
IP rehab admissions - I received a request from insurance case manager.  Updates sent earlier today.  Awaiting insurance decision regarding potential acute inpatient rehab admission.  Call for questions.  [phone] ?

## 2022-01-14 NOTE — Progress Notes (Signed)

## 2022-01-15 DIAGNOSIS — R569 Unspecified convulsions: Secondary | ICD-10-CM | POA: Diagnosis not present

## 2022-01-15 DIAGNOSIS — I680 Cerebral amyloid angiopathy: Secondary | ICD-10-CM | POA: Diagnosis not present

## 2022-01-15 DIAGNOSIS — I10 Essential (primary) hypertension: Secondary | ICD-10-CM | POA: Diagnosis not present

## 2022-01-15 NOTE — Progress Notes (Signed)
?  Progress Note ? ? ?Patient: Cynthia [name] QJJ:[id number] DOB: 12-30-1945 DOA: 01/10/2022     5 ?DOS: the patient was seen and examined on 01/15/2022 at 9:56AM ?  ? ? ? ?Brief hospital course: ?Cynthia [name] is a 76 y.o. F with hx ICH 2017, hx partial seizure no longer on Keppra who presented with acute LEFT weakness. ? ?In the ER, CT showed a left frontoparietal intraparenchymal hemorrhage. ? ? ?5/1: Admitted for IPH, BP normal, suspect CAA ?5/2: GTC seizure, loaded with Keppra ?5/3: LTM EEG showed subclinical seizure ?5/4: LTM EEG without seizure ? ? ? ? ?Assessment and Plan: ?* ICH (intracerebral hemorrhage) ([place]) ?- Hold aspirin and avoid anticoagulants ?- BP management ?- Avoid NSAIDs ? ?Hypokalemia ?Supplemented and resolved ? ?Hyperlipidemia ?- Avoid statin due to CAA ? ?Seizure ([place]) ?No further seizures ?- Continue Keppra ? ?Benign essential HTN ?BP controlled ?- Continue losartan ? ? ? ? ? ? ? ? ? ?Subjective: Patient has no complaints, no respiratory distress, no fever, good appetite. ? ? ? ? ?Physical Exam: ?Vitals:  ? 01/14/22 2252 01/15/22 0431 01/15/22 0758 01/15/22 1129  ?BP: 122/64 118/69 136/68 (!) 123/58  ?Pulse: (!) 101 80 75 97  ?Resp: '18 20 16 16  '$ ?Temp: 98.1 ?F (36.7 ?C) 97.9 ?F (36.6 ?C) 98 ?F (36.7 ?C) (!) 97.5 ?F (36.4 ?C)  ?TempSrc: Oral Oral Oral Oral  ?SpO2: 95% 96% 99% 99%  ?Weight:      ?Height:      ? ?Elderly adult female, lying in bed, interactive, appropriate ?RRR, no murmurs, no peripheral edema ?Respiratory rate normal, lungs clear without rales or wheezes ?Mild left-sided weakness, working on her phone, speech fluent, face symmetric, attention normal, affect pleasant ? ? ? ?Data Reviewed: ?Nursing notes reviewed, vital signs reviewed, telemetry reviewed, shows extremely brief 10 beats of SVT, glucose reviewed ? ?Family Communication:  ? ? ? ?Disposition: ?Status is: Inpatient ? ? ? ? ? ? ? ? ?Author: ?Edwin Dada, MD ?01/15/2022 3:39 PM ? ?For on call review  [email or web address]  ? ? ?

## 2022-01-15 NOTE — Plan of Care (Signed)
?  Problem: Education: ?Goal: Knowledge of disease or condition will improve ?Outcome: Progressing ?  ?Problem: Coping: ?Goal: Will verbalize positive feelings about self ?Outcome: Progressing ?  ?Problem: Health Behavior/Discharge Planning: ?Goal: Ability to manage health-related needs will improve ?Outcome: Progressing ?  ?Problem: Education: ?Goal: Expressions of having a comfortable level of knowledge regarding the disease process will increase ?Outcome: Progressing ?  ?Problem: Coping: ?Goal: Ability to adjust to condition or change in health will improve ?Outcome: Progressing ?  ?Problem: Medication: ?Goal: Risk for medication side effects will decrease ?Outcome: Progressing ?  ?Problem: Safety: ?Goal: Verbalization of understanding the information provided will improve ?Outcome: Progressing ?  ?Problem: Self-Concept: ?Goal: Level of anxiety will decrease ?Outcome: Progressing ?  ?

## 2022-01-16 DIAGNOSIS — I10 Essential (primary) hypertension: Secondary | ICD-10-CM | POA: Diagnosis not present

## 2022-01-16 DIAGNOSIS — I680 Cerebral amyloid angiopathy: Secondary | ICD-10-CM | POA: Diagnosis not present

## 2022-01-16 DIAGNOSIS — R569 Unspecified convulsions: Secondary | ICD-10-CM | POA: Diagnosis not present

## 2022-01-16 LAB — GLUCOSE, CAPILLARY: Glucose-Capillary: 154 mg/dL — ABNORMAL HIGH (ref 70–99)

## 2022-01-16 NOTE — Progress Notes (Signed)
?  Progress Note ? ? ?Patient: Cynthia [name] [id number] DOB: May 14, 1946 DOA: 01/10/2022     6 ?DOS: the patient was seen and examined on 01/16/2022 at 2:43PM ?  ? ? ? ?Brief hospital course: ?Cynthia [name] is a 76 y.o. F with hx ICH 2017, hx partial seizure no longer on Keppra who presented with acute LEFT weakness. ? ?Admitted for intracerebral hemorrhage due to amyloid, developed seizure, now clinically stable. ? ? ? ? ?Assessment and Plan: ?*Intracerebral hemorrhage ?- Hold aspirin and avoid anticoagulants ?- BP management ?- Avoid NSAIDs ?  ? ?  ? ?Seizure ?No further seizures ?- Continue Keppra ? ?Hypertension ?Blood pressure controlled ?- Continue losartan ? ? ? ? ? ? ? ? ? ?Subjective: No new complaints, no fever, respiratory distress, confusion, seizure. ? ? ? ? ?Physical Exam: ?Vitals:  ? 01/16/22 0000 01/16/22 0400 01/16/22 0830 01/16/22 1134  ?BP: (!) 142/78 138/70 (!) 129/98 128/64  ?Pulse: 94 94 88 (!) 108  ?Resp: '16 16 16 15  '$ ?Temp: 98.6 ?F (37 ?C) 98.4 ?F (36.9 ?C) 98.6 ?F (37 ?C) 98.1 ?F (36.7 ?C)  ?TempSrc: Oral Oral Oral Oral  ?SpO2: 96% 96% 96% 97%  ?Weight:      ?Height:      ? ?Elderly adult female, lying in bed, awakens immediately to voice, no confusion.  Voice fluent, face symmetric, attention normal, oriented to self and situation. ? ? ? ?Data Reviewed: ?Nursing notes reviewed, vital signs reviewed, glucose reviewed. ? ? ? ? ? ?Disposition: ?Status is: Inpatient ? ? ? ? ? ? ? ? ?Author: ?Edwin Dada, MD ?01/16/2022 3:31 PM ? ?For on call review [email or web address]  ? ? ?

## 2022-01-17 DIAGNOSIS — I680 Cerebral amyloid angiopathy: Secondary | ICD-10-CM | POA: Diagnosis not present

## 2022-01-17 DIAGNOSIS — I10 Essential (primary) hypertension: Secondary | ICD-10-CM | POA: Diagnosis not present

## 2022-01-17 DIAGNOSIS — R569 Unspecified convulsions: Secondary | ICD-10-CM | POA: Diagnosis not present

## 2022-01-17 MED ORDER — LEVETIRACETAM 500 MG PO TABS
1000.0000 mg | ORAL_TABLET | Freq: Two times a day (BID) | ORAL | Status: DC
Start: 2022-01-17 — End: 2022-01-17

## 2022-01-17 MED ORDER — LEVETIRACETAM 500 MG PO TABS
1000.0000 mg | ORAL_TABLET | Freq: Two times a day (BID) | ORAL | Status: DC
  Administered 2022-01-17 – 2022-01-18 (×4): 1000 mg via ORAL
  Filled 2022-01-17 (×4): qty 2

## 2022-01-17 NOTE — Progress Notes (Signed)
Physical Therapy Treatment ?Patient Details ?Name: Cynthia [name] ?MRN: [id number] ?DOB: 04-19-1946 ?Today's Date: 01/17/2022 ? ? ?History of Present Illness 19 female admitted 5/1 with L side weakness and had fallen, striking head on night stand. pt found to have acute R frontal ICH. Pt had seizure-like activity afternoon of 5/2. PMHx:  HTN,  R frontal ICH 2017, seizures, Breast Ca depression, diverticulitis, osteoporosis, CVA ? ?  ?PT Comments  ? ? Pt continues with rigid L foot into PF contracture limiting WBing tolerance. Pt soiled in stool upon arrival which limited ambulation progression today. Pt dependent with hygiene and required maxA to transfer to [facility]. Pt to benefit from AIR upon d/c for maximial functional recovery. Acute PT to con to follow. ?   ?Recommendations for follow up therapy are one component of a multi-disciplinary discharge planning process, led by the attending physician.  Recommendations may be updated based on patient status, additional functional criteria and insurance authorization. ? ?Follow Up Recommendations ? Acute inpatient rehab (3hours/day) ?  ?  ?Assistance Recommended at Discharge Intermittent Supervision/Assistance  ?Patient can return home with the following Two people to help with walking and/or transfers;A lot of help with bathing/dressing/bathroom;Assistance with cooking/housework;Assistance with feeding;Direct supervision/assist for medications management;Direct supervision/assist for financial management;Assist for transportation;Help with stairs or ramp for entrance ?  ?Equipment Recommendations ? Wheelchair (measurements PT);Wheelchair cushion (measurements PT);BSC/3in1;Other (comment)  ?  ?Recommendations for Other Services Rehab consult ? ? ?  ?Precautions / Restrictions Precautions ?Precautions: Fall ?Precaution Comments: monitor HR ?Restrictions ?Weight Bearing Restrictions: No ?Other Position/Activity Restrictions: tendency to stand on toes on L foot due to  extension tone  ?  ? ?Mobility ? Bed Mobility ?Overal bed mobility: Needs Assistance ?Bed Mobility: Supine to Sit ?  ?  ?Supine to sit: Mod assist, HOB elevated ?  ?  ?General bed mobility comments: constant max directional verbal cues given. Pt would initiate LE movement off EOB and then stop requiring more verbal cues to keep going and then modA to scoot hips forward so feet were on the ground ?  ? ?Transfers ?Overall transfer level: Needs assistance ?Equipment used: 1 person hand held assist ?Transfers: Sit to/from Stand ?Sit to Stand: Mod assist, Max assist ?Stand pivot transfers: Max assist ?  ?  ?  ?  ?General transfer comment: pt resistant due to fear, retropulsive. attempted with RW first however unable and completed fact to face transfer, assist to help pt move L foot to the BSC, trunk flexion and L foot into PF ?  ? ?Ambulation/Gait ?  ?  ?  ?  ?  ?  ?  ?  ? ? ?Stairs ?  ?  ?  ?  ?  ? ? ?Wheelchair Mobility ?  ? ?Modified Rankin (Stroke Patients Only) ?Modified Rankin (Stroke Patients Only) ?Pre-Morbid Rankin Score: No symptoms ?Modified Rankin: Moderately severe disability ? ? ?  ?Balance Overall balance assessment: Needs assistance ?Sitting-balance support: Feet supported ?Sitting balance-Leahy Scale: Poor ?Sitting balance - Comments: strong L side listing posture, but is effectively WB on L elbow on a pillow in the chair ?Postural control: Left lateral lean ?Standing balance support: Bilateral upper extremity supported ?Standing balance-Leahy Scale: Poor ?Standing balance comment: mod-maxA ?  ?  ?  ?  ?  ?  ?  ?  ?  ?  ?  ?  ? ?  ?Cognition Arousal/Alertness: Awake/alert ?Behavior During Therapy: [facility] for tasks assessed/performed ?Overall Cognitive Status: Impaired/Different from baseline ?Area of Impairment: Problem solving, Awareness, Safety/judgement,  Memory, Following commands ?  ?  ?  ?  ?  ?  ?  ?  ?  ?  ?Memory: Decreased short-term memory ?Following Commands: Follows one step commands with  increased time ?Safety/Judgement: Decreased awareness of safety, Decreased awareness of deficits ?Awareness: Intellectual (pt incontinent of stool) ?Problem Solving: Slow processing, Requires verbal cues, Requires tactile cues ?General Comments: pt slow to process, answers questions appropriately however doesn't execute tasks asked. ie. stating she knew she had a BM but never called for someone to clean her up ?  ?  ? ?  ?Exercises   ? ?  ?General Comments General comments (skin integrity, edema, etc.): VSS, pt soiled with BM and urine upon arrival. Pt assisted to [facility], pt dependent for hygiene. RN tech came to assist with hygiene while PT worked on balance and standing capability ?  ?  ? ?Pertinent Vitals/Pain Pain Assessment ?Pain Assessment: No/denies pain  ? ? ?Home Living   ?  ?  ?  ?  ?  ?  ?  ?  ?  ?   ?  ?Prior Function    ?  ?  ?   ? ?PT Goals (current goals can now be found in the care plan section) Acute Rehab PT Goals ?PT Goal Formulation: With patient ?Time For Goal Achievement: 01/25/22 ?Potential to Achieve Goals: Good ?Progress towards PT goals: Progressing toward goals ? ?  ?Frequency ? ? ? Min 4X/week ? ? ? ?  ?PT Plan Current plan remains appropriate  ? ? ?Co-evaluation   ?  ?  ?  ?  ? ?  ?AM-PAC PT "6 Clicks" Mobility   ?Outcome Measure ? Help needed turning from your back to your side while in a flat bed without using bedrails?: A Lot ?Help needed moving from lying on your back to sitting on the side of a flat bed without using bedrails?: A Lot ?Help needed moving to and from a bed to a chair (including a wheelchair)?: A Lot ?Help needed standing up from a chair using your arms (e.g., wheelchair or bedside chair)?: A Lot ?Help needed to walk in hospital room?: Total ?Help needed climbing 3-5 steps with a railing? : Total ?6 Click Score: 10 ? ?  ?End of Session Equipment Utilized During Treatment: Gait belt ?Activity Tolerance: Patient tolerated treatment well;Patient limited by fatigue ?Patient  left: in chair;with call bell/phone within reach;with chair alarm set ?Nurse Communication: Mobility status ?PT Visit Diagnosis: Unsteadiness on feet (R26.81);Other abnormalities of gait and mobility (R26.89);Difficulty in walking, not elsewhere classified (R26.2) ?  ? ? ?Time: 9798-9211 ?PT Time Calculation (min) (ACUTE ONLY): 28 min ? ?Charges:  $Therapeutic Activity: 23-37 mins          ?          ? ?Kittie Plater, PT, DPT ?Acute Rehabilitation Services ?Secure chat preferred ?Office #: [phone] ? ? ? ?Andelyn Spade M Darnel Mchan ?01/17/2022, 2:15 PM ? ?

## 2022-01-17 NOTE — Progress Notes (Signed)
?  Inpatient Rehabilitation Admissions Coordinator  ? ?We await [facility] Medicare determination for CIR approval. I met with patient at bedside and she is aware. ? ?Danne Baxter, RN, MSN ?Rehab Admissions Coordinator ?[phone] ?01/17/2022 10:30 AM ? ?

## 2022-01-17 NOTE — Progress Notes (Signed)
Occupational Therapy Treatment ?Patient Details ?Name: Cynthia [name] ?MRN: [id number] ?DOB: 09/08/1946 ?Today's Date: 01/17/2022 ? ? ?History of present illness 63 female admitted 5/1 with L side weakness and had fallen, striking head on night stand. pt found to have acute R frontal ICH. Pt had seizure-like activity afternoon of 5/2. PMHx:  HTN,  R frontal ICH 2017, seizures, Breast Ca depression, diverticulitis, osteoporosis, CVA ?  ?OT comments ? Pt progressing towards goals, requiring minA for seated grooming task due to decreased fine motor coordination. Pt mod -max A for sit to stand transfer x2 with RW. Pt needing increased cuing for hand placement and sequencing of transfer. Pt able to shift weight L <> R side with min-mod A during session. Educated pt on exercises for BUE, provided with squeeze ball for LUE strengthening. Pt able to demo exercises, and verbalizes exercises accurately at end of session to therapist. Pillow positioned on pt's L side for support and trunk stability.  Pt presenting with impairments listed below, will follow acutely. Continue to recommend AIR at d/c.  ? ?Recommendations for follow up therapy are one component of a multi-disciplinary discharge planning process, led by the attending physician.  Recommendations may be updated based on patient status, additional functional criteria and insurance authorization. ?   ?Follow Up Recommendations ? Acute inpatient rehab (3hours/day)  ?  ?Assistance Recommended at Discharge Set up Supervision/Assistance  ?Patient can return home with the following ? A lot of help with walking and/or transfers;A lot of help with bathing/dressing/bathroom;Assistance with cooking/housework;Assistance with feeding;Direct supervision/assist for medications management;Direct supervision/assist for financial management;Assist for transportation ?  ?Equipment Recommendations ? BSC/3in1  ?  ?Recommendations for Other Services Rehab consult ? ?  ?Precautions /  Restrictions Precautions ?Precautions: Fall ?Precaution Comments: monitor HR ?Restrictions ?Weight Bearing Restrictions: No ?Other Position/Activity Restrictions: tendency to stand on toes on L foot due to extension tone  ? ? ?  ? ?Mobility Bed Mobility ?  ?  ?  ?  ?  ?  ?  ?General bed mobility comments: up in recliner upon arrival ?  ? ?Transfers ?Overall transfer level: Needs assistance ?Equipment used: Rolling walker (2 wheels) ?Transfers: Sit to/from Stand ?Sit to Stand: Mod assist, Max assist ?  ?  ?  ?  ?  ?General transfer comment: pt with mod A for weight shifting R/L side in standing, max cuing for hand placement and sequencing ?  ?  ?Balance Overall balance assessment: Needs assistance ?Sitting-balance support: Feet supported ?Sitting balance-Leahy Scale: Poor ?  ?Postural control: Left lateral lean ?Standing balance support: Bilateral upper extremity supported ?Standing balance-Leahy Scale: Poor ?Standing balance comment: mod-maxA ?  ?  ?  ?  ?  ?  ?  ?  ?  ?  ?  ?   ? ?ADL either performed or assessed with clinical judgement  ? ?ADL Overall ADL's : Needs assistance/impaired ?  ?  ?Grooming: Minimal assistance;Sitting ?Grooming Details (indicate cue type and reason): min A for fine motor aspects of task ?  ?  ?  ?  ?  ?  ?  ?  ?  ?  ?  ?  ?  ?  ?Functional mobility during ADLs: Moderate assistance;Maximal assistance ?  ?  ? ?Extremity/Trunk Assessment Upper Extremity Assessment ?Upper Extremity Assessment: LUE deficits/detail ?LUE Deficits / Details: pt with ~40* shoulder flexion, weak grasp, frequently dropping items, difficulty with release ?LUE Sensation: decreased light touch;decreased proprioception ?LUE Coordination: decreased fine motor;decreased gross motor ?  ?Lower Extremity Assessment ?  Lower Extremity Assessment: Defer to PT evaluation ?  ?  ?  ? ?Vision   ?Additional Comments: will further assess ?  ?Perception Perception ?Perception: Not tested ?  ?Praxis Praxis ?Praxis: Not tested ?   ? ?Cognition Arousal/Alertness: Awake/alert ?Behavior During Therapy: [facility] for tasks assessed/performed ?Overall Cognitive Status: Impaired/Different from baseline ?Area of Impairment: Problem solving, Awareness, Safety/judgement, Memory, Following commands ?  ?  ?  ?  ?  ?  ?  ?  ?  ?  ?Memory: Decreased short-term memory ?Following Commands: Follows one step commands with increased time ?Safety/Judgement: Decreased awareness of safety, Decreased awareness of deficits ?Awareness: Intellectual ?Problem Solving: Slow processing, Requires verbal cues, Requires tactile cues ?General Comments: pt with decreased awareness of deficits, wanting to walk this session despite LLE deficits. Pt with decreased initiation with task requiring v/cing throughout ?  ?  ?   ?Exercises   ? ?  ?Shoulder Instructions   ? ? ?  ?General Comments VSS On RA  ? ? ?Pertinent Vitals/ Pain       Pain Assessment ?Pain Assessment: No/denies pain ? ?Home Living   ?  ?  ?  ?  ?  ?  ?  ?  ?  ?  ?  ?  ?  ?  ?  ?  ?  ?  ? ?  ?Prior Functioning/Environment    ?  ?  ?  ?   ? ?Frequency ? Min 2X/week  ? ? ? ? ?  ?Progress Toward Goals ? ?OT Goals(current goals can now be found in the care plan section) ? Progress towards OT goals: Progressing toward goals ? ?Acute Rehab OT Goals ?Patient Stated Goal: to get better ?OT Goal Formulation: With patient ?Time For Goal Achievement: 01/25/22 ?Potential to Achieve Goals: Good ?ADL Goals ?Pt Will Perform Grooming: with set-up;sitting ?Pt Will Transfer to Toilet: with mod assist;stand pivot transfer;bedside commode ?Pt/caregiver will Perform Home Exercise Program: Left upper extremity;Increased ROM;Increased strength;With Supervision;With written HEP provided ?Additional ADL Goal #1: pt will complete bed mobility supervision level as precursor to adls. ?Additional ADL Goal #2: Pt will complete basic transfer supervision level as precursor to adls  ?Plan Discharge plan remains appropriate   ? ?Co-evaluation ? ? ?    ?  ?  ?  ?  ? ?  ?AM-PAC OT "6 Clicks" Daily Activity     ?Outcome Measure ? ? Help from another person eating meals?: A Lot ?Help from another person taking care of personal grooming?: A Lot ?Help from another person toileting, which includes using toliet, bedpan, or urinal?: A Lot ?Help from another person bathing (including washing, rinsing, drying)?: A Lot ?Help from another person to put on and taking off regular upper body clothing?: A Lot ?Help from another person to put on and taking off regular lower body clothing?: A Lot ?6 Click Score: 12 ? ?  ?End of Session Equipment Utilized During Treatment: Gait belt;Rolling walker (2 wheels) ? ?OT Visit Diagnosis: Unsteadiness on feet (R26.81);Hemiplegia and hemiparesis ?Hemiplegia - Right/Left: Left ?Hemiplegia - dominant/non-dominant: Non-Dominant ?Hemiplegia - caused by: Nontraumatic SAH ?  ?Activity Tolerance Patient tolerated treatment well ?  ?Patient Left in chair;with call bell/phone within reach;with chair alarm set ?  ?Nurse Communication Mobility status ?  ? ?   ? ?Time: 1012-1050 ?OT Time Calculation (min): 38 min ? ?Charges: OT General Charges ?$OT Visit: 1 Visit ?OT Treatments ?$Self Care/Home Management : 8-22 mins ?$Therapeutic Activity: 23-37 mins ? ?Lynnda Child, OTD,  OTR/L ?Acute Rehab ?[phone] ? ? ?Kaylyn Lim ?01/17/2022, 12:33 PM ?

## 2022-01-17 NOTE — Progress Notes (Signed)
Inpatient Rehabilitation Admissions Coordinator  ? ?We have received, insurance approval for Cir and are hopeful for a bed Tuesday. Patient is aware. I reviewed estimated cost of care with her. ? ?Danne Baxter, RN, MSN ?Rehab Admissions Coordinator ?[phone] ?01/17/2022 12:50 PM ? ?

## 2022-01-17 NOTE — Progress Notes (Signed)
?  Progress Note ? ? ?Patient: Cynthia [name] ERD:[id number] DOB: 1945-10-21 DOA: 01/10/2022     7 ?DOS: the patient was seen and examined on 01/17/2022 at 11:38AM ?  ? ? ? ?Brief hospital course: ?Cynthia [name] is a 76 y.o. F with hx ICH 2017, hx partial seizure no longer on Keppra who presented with acute LEFT weakness. ? ?Admitted for intracerebral hemorrhage due to amyloid, developed seizure, now clinically stable. ? ? ? ? ?Assessment and Plan: ?  ? ?Intracerebral hemorrhage ?Hypertension ?Seizure ?- Continue Keppra, switch to oral ?- Continue losartan (blood pressure controlled) ?- Avoid aspirin, anticoagulants, NSAIDs ? ?Supraventricular tachycardia ?Patient has had some episodes of very brief 1 to 2 seconds (5-10 beats) narrow complex tachycardias.  These appear to be real, but the significance is unclear.  Given the patient has had 2 spontaneous cerebral hemorrhages, she is not an anticoagulation candidate, and I recommend no further work-up of these asymptomatic SVTs at this time. ? ? ? ? ? ? ? ?Subjective: Feels well, no new weakness, fever, confusion, respiratory distress, no seizures. ? ? ? ? ?Physical Exam: ?Vitals:  ? 01/16/22 1134 01/16/22 1621 01/17/22 0727 01/17/22 1206  ?BP: 128/64 101/61 (!) 148/71 124/67  ?Pulse: (!) 108 91 91 (!) 109  ?Resp: '15 17 16 18  '$ ?Temp: 98.1 ?F (36.7 ?C) 98.3 ?F (36.8 ?C) 98.4 ?F (36.9 ?C) 98.7 ?F (37.1 ?C)  ?TempSrc: Oral Oral Oral Oral  ?SpO2: 97% 97% 97% 96%  ?Weight:      ?Height:      ? ?Elderly adult female, sitting up in bed, brushing her teeth with occupational therapy, interactive and appropriate ?Tachycardic, regular, no murmurs, no peripheral edema ?Respiratory rate normal, lungs clear without rales or wheezes ?She has some diminished strength in the left, strength normal on the right, speech fluent, face symmetric. ? ? ? ?Data Reviewed: ?Nursing notes reviewed, vital signs reviewed, glucose reviewed ? ? ? ? ? ?Disposition: ?Status is: Inpatient ?The patient is  medically ready for discharge to CIR when bed is available, otherwise she does not have a safe disposition ? ? ? ? ? ? ? ?Author: ?Edwin Dada, MD ?01/17/2022 3:17 PM ? ?For on call review [email or web address]  ? ? ?

## 2022-01-18 ENCOUNTER — Other Ambulatory Visit: Payer: Self-pay

## 2022-01-18 DIAGNOSIS — I611 Nontraumatic intracerebral hemorrhage in hemisphere, cortical: Secondary | ICD-10-CM

## 2022-01-18 DIAGNOSIS — E785 Hyperlipidemia, unspecified: Secondary | ICD-10-CM | POA: Diagnosis not present

## 2022-01-18 DIAGNOSIS — E876 Hypokalemia: Secondary | ICD-10-CM | POA: Diagnosis not present

## 2022-01-18 DIAGNOSIS — I10 Essential (primary) hypertension: Secondary | ICD-10-CM | POA: Diagnosis not present

## 2022-01-18 DIAGNOSIS — R569 Unspecified convulsions: Secondary | ICD-10-CM | POA: Diagnosis not present

## 2022-01-18 MED ORDER — ALPRAZOLAM 0.25 MG PO TABS
0.2500 mg | ORAL_TABLET | Freq: Once | ORAL | Status: AC
  Administered 2022-01-18: 0.25 mg via ORAL
  Filled 2022-01-18: qty 1

## 2022-01-18 MED ORDER — LEVETIRACETAM 1000 MG PO TABS: 1000.0000 mg | ORAL_TABLET | Freq: Two times a day (BID) | ORAL | Status: DC

## 2022-01-18 NOTE — Progress Notes (Signed)
Inpatient Rehabilitation Admission Medication Review by a Pharmacist ? ?A complete drug regimen review was completed for this patient to identify any potential clinically significant medication issues. ? ?High Risk Drug Classes Is patient taking? Indication by Medication  ?Antipsychotic No   ?Anticoagulant No   ?Antibiotic No   ?Opioid No   ?Antiplatelet No   ?Hypoglycemics/insulin No   ?Vasoactive Medication Yes Losartan for BP  ?Chemotherapy No   ?Other Yes Effexor for mood ?Keppra for seizures ?Protonix for GERD  ? ? ? ?Type of Medication Issue Identified Description of Issue Recommendation(s)  ?Drug Interaction(s) (clinically significant) ?    ?Duplicate Therapy ?    ?Allergy ?    ?No Medication Administration End Date ?    ?Incorrect Dose ?    ?Additional Drug Therapy Needed ?    ?Significant med changes from prior encounter (inform family/care partners about these prior to discharge).    ?Other ?    ? ? ?Clinically significant medication issues were identified that warrant physician communication and completion of prescribed/recommended actions by midnight of the next day:  No ? ?Pharmacist comments: None (supplements can be resumed at discharge) ? ?Time spent performing this drug regimen review (minutes):  20 minutes ? ? ?Tad Moore ?01/18/2022 11:06 AM ?

## 2022-01-18 NOTE — Progress Notes (Signed)
Inpatient Rehab Admissions Coordinator:    I have a CIR bed for this Pt. Today. MD to enter d/c orders. RN may call report to [phone].  Kamar Callender, MS, CCC-SLP Rehab Admissions Coordinator  [phone] (celll) [phone] (office)  

## 2022-01-18 NOTE — Progress Notes (Signed)
Physical Therapy Treatment ?Patient Details ?Name: Cynthia [name] ?MRN: [id number] ?DOB: 11-Apr-1946 ?Today's Date: 01/18/2022 ? ? ?History of Present Illness 68 female admitted 5/1 with L side weakness and had fallen, striking head on night stand. pt found to have acute R frontal ICH. Pt had seizure-like activity afternoon of 5/2. PMHx:  HTN,  R frontal ICH 2017, seizures, Breast Ca depression, diverticulitis, osteoporosis, CVA ? ?  ?PT Comments  ? ? Pt able to tolerate ambulation x 10' with RW today however requiring max posterior support to maintain upright position in addition to verbal and tactile cues for weight shifting and sequencing stepping. Pt with impaired processing especially when involving L UE and LE however is able to report the birth of her grand daughter today including name, weight, and height. Acute PT to cont to follow.  ?   ?Recommendations for follow up therapy are one component of a multi-disciplinary discharge planning process, led by the attending physician.  Recommendations may be updated based on patient status, additional functional criteria and insurance authorization. ? ?Follow Up Recommendations ? Acute inpatient rehab (3hours/day) ?  ?  ?Assistance Recommended at Discharge Frequent or constant Supervision/Assistance  ?Patient can return home with the following Two people to help with walking and/or transfers;A lot of help with bathing/dressing/bathroom;Assistance with cooking/housework;Assistance with feeding;Direct supervision/assist for medications management;Direct supervision/assist for financial management;Assist for transportation;Help with stairs or ramp for entrance ?  ?Equipment Recommendations ? Wheelchair (measurements PT);Wheelchair cushion (measurements PT);BSC/3in1;Other (comment)  ?  ?Recommendations for Other Services Rehab consult ? ? ?  ?Precautions / Restrictions Precautions ?Precautions: Fall ?Restrictions ?Weight Bearing Restrictions: No  ?  ? ?Mobility ? Bed  Mobility ?Overal bed mobility: Needs Assistance ?Bed Mobility: Supine to Sit ?  ?  ?Supine to sit: Mod assist, HOB elevated ?  ?  ?General bed mobility comments: constant max directional verbal cues given. Pt would initiate LE movement off EOB and then stop requiring more verbal cues to keep going and then modA to scoot hips forward so feet were on the ground ?  ? ?Transfers ?Overall transfer level: Needs assistance ?Equipment used: Rolling walker (2 wheels) ?Transfers: Sit to/from Stand ?Sit to Stand: Mod assist, Max assist ?  ?  ?  ?  ?  ?General transfer comment: pt with retropulsion and only initiated stand with R UE and LE, tactile and verbal cues to use L UE and LE to hold onto walker ?  ? ?Ambulation/Gait ?Ambulation/Gait assistance: Max assist, +2 safety/equipment ?Gait Distance (Feet): 10 Feet ?Assistive device: Rolling walker (2 wheels) ?Gait Pattern/deviations: Step-to pattern, Decreased dorsiflexion - left, Knee flexed in stance - left, Decreased weight shift to left, [place]alg[place], Narrow base of support ?Gait velocity: reduced ?Gait velocity interpretation: <1.8 ft/sec, indicate of risk for recurrent falls ?  ?General Gait Details: pt unable to achieve L ankle to neutral/foot flat when in standing and limiting WBing tolerance to ball of foot, pt requiring tactile cues to weight shift at hips posteriorly, RN to assist with walker management from the front ? ? ?Stairs ?  ?  ?  ?  ?  ? ? ?Wheelchair Mobility ?  ? ?Modified Rankin (Stroke Patients Only) ?Modified Rankin (Stroke Patients Only) ?Pre-Morbid Rankin Score: No symptoms ?Modified Rankin: Moderately severe disability ? ? ?  ?Balance Overall balance assessment: Needs assistance ?Sitting-balance support: Feet supported ?Sitting balance-Leahy Scale: Fair ?Sitting balance - Comments: slight L lateral lean but able to maintain balance without external support, distant supervision ?Postural control: Left lateral lean ?  Standing balance support: Bilateral  upper extremity supported ?Standing balance-Leahy Scale: Poor ?Standing balance comment: maxA to maintain balance with support of RW for hygiene s/p BM ?  ?  ?  ?  ?  ?  ?  ?  ?  ?  ?  ?  ? ?  ?Cognition Arousal/Alertness: Awake/alert ?Behavior During Therapy: [facility] for tasks assessed/performed ?Overall Cognitive Status: Impaired/Different from baseline ?Area of Impairment: Problem solving, Awareness, Safety/judgement, Memory, Following commands ?  ?  ?  ?  ?  ?  ?  ?  ?  ?  ?Memory: Decreased short-term memory ?Following Commands: Follows one step commands with increased time ?Safety/Judgement: Decreased awareness of safety, Decreased awareness of deficits (L inattention) ?Awareness: Intellectual ?Problem Solving: Slow processing, Decreased initiation, Difficulty sequencing, Requires verbal cues, Requires tactile cues (worse with L side) ?General Comments: pt unable to sequence transfers especially when PT instructing to use L UE or LE requiring max tactile cues to engage L UE and LE ?  ?  ? ?  ?Exercises   ? ?  ?General Comments General comments (skin integrity, edema, etc.): VSS, pt soiled, dependent for hygiene ?  ?  ? ?Pertinent Vitals/Pain Pain Assessment ?Pain Assessment: No/denies pain  ? ? ?Home Living   ?  ?  ?  ?  ?  ?  ?  ?  ?  ?   ?  ?Prior Function    ?  ?  ?   ? ?PT Goals (current goals can now be found in the care plan section) Acute Rehab PT Goals ?PT Goal Formulation: With patient ?Time For Goal Achievement: 01/25/22 ?Potential to Achieve Goals: Good ?Progress towards PT goals: Progressing toward goals ? ?  ?Frequency ? ? ? Min 4X/week ? ? ? ?  ?PT Plan Current plan remains appropriate  ? ? ?Co-evaluation   ?  ?  ?  ?  ? ?  ?AM-PAC PT "6 Clicks" Mobility   ?Outcome Measure ? Help needed turning from your back to your side while in a flat bed without using bedrails?: A Lot ?Help needed moving from lying on your back to sitting on the side of a flat bed without using bedrails?: A Lot ?Help needed  moving to and from a bed to a chair (including a wheelchair)?: A Lot ?Help needed standing up from a chair using your arms (e.g., wheelchair or bedside chair)?: A Lot ?Help needed to walk in hospital room?: A Lot ?Help needed climbing 3-5 steps with a railing? : Total ?6 Click Score: 11 ? ?  ?End of Session Equipment Utilized During Treatment: Gait belt ?Activity Tolerance: Patient tolerated treatment well;Patient limited by fatigue ?Patient left: in chair;with call bell/phone within reach;with chair alarm set ?Nurse Communication: Mobility status ?PT Visit Diagnosis: Unsteadiness on feet (R26.81);Other abnormalities of gait and mobility (R26.89);Difficulty in walking, not elsewhere classified (R26.2) ?  ? ? ?Time: 6295-2841 ?PT Time Calculation (min) (ACUTE ONLY): 26 min ? ?Charges:  $Gait Training: 8-22 mins ?$Neuromuscular Re-education: 8-22 mins          ?          ? ?Kittie Plater, PT, DPT ?Acute Rehabilitation Services ?Secure chat preferred ?Office #: [phone] ? ? ? ?Jaydon Soroka M Ettamae Barkett ?01/18/2022, 3:34 PM ? ?

## 2022-01-18 NOTE — PMR Pre-admission (Signed)
PMR Admission Coordinator Pre-Admission Assessment ? ?Patient: Cynthia [name] is an 76 y.o., female ?MRN: [id number] ?DOB: 1946/05/15 ?Height: _0  (167.6 cm) ?Weight: 59.6 kg ? ?Insurance Information ?HMO:     PPO: yes     PCP:      IPA:      80/20:      OTHER:  ?PRIMARY: [employer] Medicare       Policy#: [id number]      Subscriber: pt ?CM Name: Cassie     Phone#:  [phone] ext [phone]   Fax#: [phone]  Received approval from [place] at [facility] 5/825 with auth good for 5 days. Clinical updates due 01/25/22 ?Pre-Cert#: [id number]      Employer:  ?Benefits:  Phone #:      Name:  ?Irene Shipper Date: 09/13/2019- still active  ?Deductible: does not have  ?OOP Max: $4,000 ($100 met)  ?CIR: $160/day co-pay with a max co-pay of $1,600/admission (10 days)  ?SNF: 100% coverage  ?Outpatient:  $20/visit co-pay  ?Home Health:  100% coverage  ?DME: 80% coverage; 20% co-insurance  ?Providers: in network  ?SECONDARY: none       Policy#:      Phone#:  ? ?Financial Counselor:       Phone#:  ? ?The ?Data Collection Information Summary? for patients in Inpatient Rehabilitation Facilities with attached ?Privacy Act [place] Records? was provided and verbally reviewed with: Patient ? ?Emergency Contact Information ?Contact Information   ? ? Name Relation Home Work Mobile  ? Cynthia [name] Son   [phone]  ? Cynthia [name] Son   [phone]  ? ?  ? ? ?Current Medical History  ?Patient Admitting Diagnosis: Seizure, ICH ?History of Present Illness: Cynthia [name] is a 76 year old right-handed female with history of hypertension, right frontal ICH 2017, breast cancer with lumpectomy with radioactive seed localization 08/28/2020.  Per chart review patient lives with her children.  Bed bath main level of home with 4 steps to entry.  Son works third shift at home during the day.  She is a retired Pharmacist, hospital reportedly independent prior to admission.  Presented 01/10/2022 with acute onset of left-sided weakness and slid out of bed.  She  attempted to stand hitting the back of her head without loss of consciousness.  Cranial CT scan showed intraparenchymal hematoma in the right parasagittal frontal lobe measuring 3.0 cm AP by 2.3 cm TV by 3.0 cm with mild surrounding edema but no midline shift.  CT cervical spine maxillofacial negative.  Initial EEG negative.  Overnight EEG with video showed seizure arising from right frontal temporal region.  Admission chemistry unremarkable except glucose 108, WBC 12,700.  Echocardiogram ejection fraction of 60 to 65% no wall motion abnormalities.  Patient has been loaded with Keppra for seizure prophylaxis.  Follow-up MRI of the brain as well as angio head and neck showed acute/subacute hematoma right pericallosal frontal lobe unchanged.  MRA negative.  Tolerating a regular diet.  Therapy evaluations completed due to patient decreased functional mobility was admitted for a comprehensive rehab program. ?  ?Complete NIHSS TOTAL: 3 ? ?Patient's medical record from [facility] has been reviewed by the rehabilitation admission coordinator and physician. ? ?Past Medical History  ?Past Medical History:  ?Diagnosis Date  ? Breast cancer ([place])   ? Colon polyps   ? Depression   ? history of  ? Diverticulitis   ? GERD (gastroesophageal reflux disease)   ? Hypertension   ? Osteoporosis   ? Seizures ([place])   ? Stroke [facility])   ? ? ?  Has the patient had major surgery during 100 days prior to admission? No ? ?Family History   ?family history includes Cancer in her mother; Heart disease in her mother; Stomach cancer in her maternal grandfather; Stroke in her paternal grandmother. ? ?Current Medications ? ?Current Facility-Administered Medications:  ?  acetaminophen (TYLENOL) tablet 650 mg, 650 mg, Oral, Q4H PRN, 650 mg at 01/16/22 1002 **OR** acetaminophen (TYLENOL) 160 MG/5ML solution 650 mg, 650 mg, Per Tube, Q4H PRN **OR** acetaminophen (TYLENOL) suppository 650 mg, 650 mg, Rectal, Q4H PRN, Rosalin Hawking, MD ?   docusate sodium (COLACE) capsule 100 mg, 100 mg, Oral, Daily, Danford, Suann Larry, MD, 100 mg at 01/17/22 1035 ?  labetalol (NORMODYNE) injection 5-20 mg, 5-20 mg, Intravenous, Q2H PRN, Rosalin Hawking, MD ?  levETIRAcetam (KEPPRA) tablet 1,000 mg, 1,000 mg, Oral, BID, Danford, Suann Larry, MD, 1,000 mg at 01/18/22 0844 ?  LORazepam (ATIVAN) injection 2 mg, 2 mg, Intravenous, PRN, Rosalin Hawking, MD ?  losartan (COZAAR) tablet 100 mg, 100 mg, Oral, Daily, Rosalin Hawking, MD, 100 mg at 01/18/22 0844 ?  pantoprazole (PROTONIX) EC tablet 40 mg, 40 mg, Oral, Daily, Rosalin Hawking, MD, 40 mg at 01/18/22 0844 ?  venlafaxine XR (EFFEXOR-XR) 24 hr capsule 75 mg, 75 mg, Oral, Q breakfast, Pham, Minh Q, RPH-CPP, 75 mg at 01/18/22 0844 ?  vitamin B-12 (CYANOCOBALAMIN) tablet 1,000 mcg, 1,000 mcg, Oral, Daily, Rosalin Hawking, MD, 1,000 mcg at 01/18/22 0844 ? ?Patients Current Diet:  ?Diet Order   ? ?       ?  Diet regular Room service appropriate? Yes with Assist; Fluid consistency: Thin  Diet effective now       ?  ? ?  ?  ? ?  ? ? ?Precautions / Restrictions ?Precautions ?Precautions: Fall ?Precaution Comments: monitor HR ?Restrictions ?Weight Bearing Restrictions: No ?Other Position/Activity Restrictions: tendency to stand on toes on L foot due to extension tone  ? ?Has the patient had 2 or more falls or a fall with injury in the past year? Yes ? ?Prior Activity Level ?Community (5-7x/wk): Pt. active in the community PTA ? ?Prior Functional Level ?Self Care: Did the patient need help bathing, dressing, using the toilet or eating? Independent ? ?Indoor Mobility: Did the patient need assistance with walking from room to room (with or without device)? Independent ? ?Stairs: Did the patient need assistance with internal or external stairs (with or without device)? Independent ? ?Functional Cognition: Did the patient need help planning regular tasks such as shopping or remembering to take medications? Needed some help ? ?Patient  Information ?Are you of Hispanic, Latino/a,or Spanish origin?: A. No, not of Hispanic, Latino/a, or Spanish origin ?What is your race?: A. White ?Do you need or want an interpreter to communicate with a doctor or health care staff?: 0. No ? ?Patient's Response To:  ?Health Literacy and Transportation ?Is the patient able to respond to health literacy and transportation needs?: Yes ?Health Literacy - How often do you need to have someone help you when you read instructions, pamphlets, or other written material from your doctor or pharmacy?: Never ?In the past 12 months, has lack of transportation kept you from medical appointments or from getting medications?: No ?In the past 12 months, has lack of transportation kept you from meetings, work, or from getting things needed for daily living?: No ? ?Home Assistive Devices / Equipment ?Home Assistive Devices/Equipment: None ?Home Equipment: None ? ?Prior Device Use: Indicate devices/aids used by the patient prior to  current illness, exacerbation or injury? None of the above ? ?Current Functional Level ?Cognition ? Arousal/Alertness: Awake/alert ?Overall Cognitive Status: Impaired/Different from baseline ?Orientation Level: Oriented X4 ?Following Commands: Follows one step commands with increased time ?Safety/Judgement: Decreased awareness of safety, Decreased awareness of deficits ?General Comments: pt slow to process, answers questions appropriately however doesn't execute tasks asked. ie. stating she knew she had a BM but never called for someone to clean her up ?Attention: Selective ?Selective Attention: Impaired ?Selective Attention Impairment: Verbal basic ?Memory: Impaired ?Memory Impairment: Retrieval deficit, Decreased recall of new information ?Awareness: Impaired ?Awareness Impairment: Intellectual impairment, Emergent impairment ?Problem Solving: Impaired ?Problem Solving Impairment: Verbal complex ?Safety/Judgment: Impaired ?   ?Extremity  Assessment ?(includes Sensation/Coordination) ? Upper Extremity Assessment: LUE deficits/detail ?LUE Deficits / Details: pt with ~40* shoulder flexion, weak grasp, frequently dropping items, difficulty with release ?LUE Sensation: decrea

## 2022-01-18 NOTE — Progress Notes (Signed)
PMR Admission Coordinator Pre-Admission Assessment ? ?Patient: Cynthia [name] is an 76 y.o., female ?MRN: [id number] ?DOB: Feb 12, 1946 ?Height:   ?Weight:   ? ?Insurance Information ?HMO:     PPO: yes     PCP:      IPA:      80/20:      OTHER:  ?PRIMARY: [employer] Medicare       Policy#: [id number]      Subscriber: pt ?CM Name: Cynthia [name]     Phone#:  [phone] ext [phone]   Fax#: [phone]  Received approval from [place] at [facility] 5/825 with auth good for 5 days. Clinical updates due 01/25/22 ?Pre-Cert#: [id number]      Employer:  ?Benefits:  Phone #:      Name:  ?Cynthia [name] Date: 09/13/2019- still active  ?Deductible: does not have  ?OOP Max: $4,000 ($100 met)  ?CIR: $160/day co-pay with a max co-pay of $1,600/admission (10 days)  ?SNF: 100% coverage  ?Outpatient:  $20/visit co-pay  ?Home Health:  100% coverage  ?DME: 80% coverage; 20% co-insurance  ?Providers: in network  ?SECONDARY: none       Policy#:      Phone#:  ? ?Financial Counselor:       Phone#:  ? ?The ?Data Collection Information Summary? for patients in Inpatient Rehabilitation Facilities with attached ?Privacy Act [place] Records? was provided and verbally reviewed with: Patient ? ?Emergency Contact Information ?Contact Information   ? ? Name Relation Home Work Mobile  ? Cynthia [name] Son   [phone]  ? Cynthia [name] Son   [phone]  ? ?  ? ? ?Current Medical History  ?Patient Admitting Diagnosis: Seizure, ICH ?History of Present Illness: Cynthia [name] is a 76 year old right-handed female with history of hypertension, right frontal ICH 2017, breast cancer with lumpectomy with radioactive seed localization 08/28/2020.  Per chart review patient lives with her children.  Bed bath main level of home with 4 steps to entry.  Son works third shift at home during the day.  She is a retired Pharmacist, hospital reportedly independent prior to admission.  Presented 01/10/2022 with acute onset of left-sided weakness and slid out of bed.  She attempted to stand  hitting the back of her head without loss of consciousness.  Cranial CT scan showed intraparenchymal hematoma in the right parasagittal frontal lobe measuring 3.0 cm AP by 2.3 cm TV by 3.0 cm with mild surrounding edema but no midline shift.  CT cervical spine maxillofacial negative.  Initial EEG negative.  Overnight EEG with video showed seizure arising from right frontal temporal region.  Admission chemistry unremarkable except glucose 108, WBC 12,700.  Echocardiogram ejection fraction of 60 to 65% no wall motion abnormalities.  Patient has been loaded with Keppra for seizure prophylaxis.  Follow-up MRI of the brain as well as angio head and neck showed acute/subacute hematoma right pericallosal frontal lobe unchanged.  MRA negative.  Tolerating a regular diet.  Therapy evaluations completed due to patient decreased functional mobility was admitted for a comprehensive rehab program. ?  ?  ? ?Patient's medical record from [facility] has been reviewed by the rehabilitation admission coordinator and physician. ? ?Past Medical History  ?Past Medical History:  ?Diagnosis Date  ? Breast cancer ([place])   ? Colon polyps   ? Depression   ? history of  ? Diverticulitis   ? GERD (gastroesophageal reflux disease)   ? Hypertension   ? Osteoporosis   ? Seizures ([place])   ? Stroke [facility])   ? ? ?Has the  patient had major surgery during 100 days prior to admission? No ? ?Family History   ?family history includes Cancer in her mother; Heart disease in her mother; Stomach cancer in her maternal grandfather; Stroke in her paternal grandmother. ? ?Current Medications ?No current facility-administered medications for this encounter. ? ?Current Outpatient Medications:  ?  levETIRAcetam (KEPPRA) 1000 MG tablet, Take 1 tablet (1,000 mg total) by mouth 2 (two) times daily., Disp: , Rfl:  ? ?Facility-Administered Medications Ordered in Other Encounters:  ?  acetaminophen (TYLENOL) tablet 650 mg, 650 mg, Oral, Q4H PRN, 650 mg at  01/16/22 1002 **OR** acetaminophen (TYLENOL) 160 MG/5ML solution 650 mg, 650 mg, Per Tube, Q4H PRN **OR** acetaminophen (TYLENOL) suppository 650 mg, 650 mg, Rectal, Q4H PRN, Cynthia Hawking, [name] ?  docusate sodium (COLACE) capsule 100 mg, 100 mg, Oral, Daily, Danford, Cynthia [name], 100 mg at 01/17/22 1035 ?  labetalol (NORMODYNE) injection 5-20 mg, 5-20 mg, Intravenous, Q2H PRN, Cynthia Hawking, [name] ?  levETIRAcetam (KEPPRA) tablet 1,000 mg, 1,000 mg, Oral, BID, Danford, Suann Larry, [name], 1,000 mg at 01/18/22 0844 ?  LORazepam (ATIVAN) injection 2 mg, 2 mg, Intravenous, PRN, Cynthia Hawking, [name] ?  losartan (COZAAR) tablet 100 mg, 100 mg, Oral, Daily, Cynthia Hawking, [name], 100 mg at 01/18/22 0844 ?  pantoprazole (PROTONIX) EC tablet 40 mg, 40 mg, Oral, Daily, Cynthia Hawking, [name], 40 mg at 01/18/22 0844 ?  venlafaxine XR (EFFEXOR-XR) 24 hr capsule 75 mg, 75 mg, Oral, [name] breakfast, Cynthia [name], RPH-CPP, 75 mg at 01/18/22 0844 ?  vitamin B-12 (CYANOCOBALAMIN) tablet 1,000 mcg, 1,000 mcg, Oral, Daily, Cynthia Hawking, [name], 1,000 mcg at 01/18/22 0844 ? ?Patients Current Diet:  ?Diet Order   ? ? None  ? ?  ? ? ?Precautions / Restrictions ?   ? ?Has the patient had 2 or more falls or a fall with injury in the past year? Yes ? ?Prior Activity Level ?  ? ?Prior Functional Level ?Self Care: Did the patient need help bathing, dressing, using the toilet or eating? Independent ? ?Indoor Mobility: Did the patient need assistance with walking from room to room (with or without device)? Independent ? ?Stairs: Did the patient need assistance with internal or external stairs (with or without device)? Independent ? ?Functional Cognition: Did the patient need help planning regular tasks such as shopping or remembering to take medications? Needed some help ? ?Patient Information ?  ? ?Patient's Response To:  ?  ? ?Home Assistive Devices / Equipment ?  ? ?Prior Device Use: Indicate devices/aids used by the patient prior to current illness, exacerbation or  injury? None of the above ? ?Current Functional Level ?Cognition ?   ?   ?Extremity Assessment ?(includes Sensation/Coordination) ?    ?   ?  ?ADLs ?    ?  ?Mobility ?    ?  ?Transfers ?    ?  ?Ambulation / Gait / Stairs / Wheelchair Mobility ?    ?  ?Posture / Balance    ?  ?Special needs/care consideration Continuous Drip IV  Keppra  ? ?Previous Home Environment (from acute therapy documentation) ?  ? ?Discharge Living Setting ?  ? ?Social/Family/Support Systems ?  ? ?Goals ?  ? ?Decrease burden of Care through IP rehab admission: Specialzed equipment needs, Decrease number of caregivers, Bowel and bladder program, and Patient/family education ? ?Possible need for SNF placement upon discharge: not anticipated  ? ?Patient Condition: I have reviewed medical records from [facility],  spoken with CM, and patient and son. I met with patient at the bedside for inpatient rehabilitation assessment.  Patient will benefit from ongoing PT, OT, and SLP, can actively participate in 3 hours of therapy a day 5 days of the week, and can make measurable gains during the admission.  Patient will also benefit from the coordinated team approach during an Inpatient Acute Rehabilitation admission.  The patient will receive intensive therapy as well as Rehabilitation physician, nursing, social worker, and care management interventions.  Due to bladder management, safety, skin/wound care, disease management, medication administration, and patient education the patient requires 24 hour a day rehabilitation nursing.  The patient is currently Min A- mod A with mobility and basic ADLs.  Discharge setting and therapy post discharge at home with home health is anticipated.  Patient has agreed to participate in the Acute Inpatient Rehabilitation Program and will admit today. ? ?Preadmission Screen Completed By:  Genella Mech, 01/18/2022 1:37 PM ?______________________________________________________________________   ?Discussed  status with Dr. Hewitt Blade on 01/18/22 at 59 and received approval for admission today. ? ?Admission Coordinator:  Genella Mech, CCC-SLP, time 1015/Date 01/18/22  ? ?Assessment/Plan: ?Diagnosis: ?Does the need

## 2022-01-18 NOTE — TOC Transition Note (Signed)
Transition of Care (TOC) - CM/SW Discharge Note ? ? ?Patient Details  ?Name: Cynthia [name] ?MRN: [id number] ?Date of Birth: 31-Oct-1945 ? ?Transition of Care (TOC) CM/SW Contact:  ?Pollie Friar, RN ?Phone Number: ?01/18/2022, 10:04 AM ? ? ?Clinical Narrative:    ?Patient is discharging to CIR today. CM signing off.  ? ? ?Final next level of care: [place] ?Barriers to Discharge: No Barriers Identified ? ? ?Patient Goals and CMS Choice ?  ?  ?Choice offered to / list presented to : Patient ? ?Discharge Placement ?  ?           ?  ?  ?  ?  ? ?Discharge Plan and Services ?  ?  ?           ?  ?  ?  ?  ?  ?  ?  ?  ?  ?  ? ?Social Determinants of Health (SDOH) Interventions ?  ? ? ?Readmission Risk Interventions ?   ? View : No data to display.  ?  ?  ?  ? ? ? ? ? ?

## 2022-01-18 NOTE — H&P (Signed)
?  ?Physical Medicine and Rehabilitation Admission H&P ?  ?  ?   ?Chief Complaint  ?Patient presents with  ? Code Stroke  ?: ?HPI: Cynthia [name] is a 76 year old right-handed female with history of hypertension, right frontal ICH 2017, breast cancer with lumpectomy with radioactive seed localization 08/28/2020.  Per chart review patient lives with her children.  Bed bath main level of home with 4 steps to entry.  Son works third shift at home during the day.  She is a retired Pharmacist, hospital reportedly independent prior to admission.  Presented 01/10/2022 with acute onset of left-sided weakness and slid out of bed.  She attempted to stand hitting the back of her head without loss of consciousness.  Cranial CT scan showed intraparenchymal hematoma in the right parasagittal frontal lobe measuring 3.0 cm AP by 2.3 cm TV by 3.0 cm with mild surrounding edema but no midline shift.  CT cervical spine maxillofacial negative.  Initial EEG negative.  Overnight EEG with video showed seizure arising from right frontal temporal region.  Admission chemistry unremarkable except glucose 108, WBC 12,700.  Echocardiogram ejection fraction of 60 to 65% no wall motion abnormalities.  Patient has been loaded with Keppra for seizure prophylaxis.  Follow-up MRI of the brain as well as angio head and neck showed acute/subacute hematoma right pericallosal frontal lobe unchanged.  MRA negative.  Tolerating a regular diet.  Therapy evaluations completed due to patient decreased functional mobility was admitted for a comprehensive rehab program. ?  ?Pt admits to no more seizures since on keppra.  ?No pain; LBM this Am ?Using Purewick for voiding- voiding OK.  ?Generalized weakness ?Feels a little slower.  ?  ?  ?Review of Systems  ?Constitutional:  Negative for chills and fever.  ?HENT:  Negative for hearing loss.   ?Eyes:  Negative for blurred vision and double vision.  ?Respiratory:  Negative for cough and shortness of breath.    ?Cardiovascular:  Negative for chest pain, palpitations and leg swelling.  ?Gastrointestinal:  Positive for constipation. Negative for heartburn, nausea and vomiting.  ?     GERD  ?Genitourinary:  Negative for dysuria, flank pain and hematuria.  ?Musculoskeletal:  Positive for joint pain and myalgias.  ?Skin:  Negative for rash.  ?Neurological:  Positive for weakness.  ?Psychiatric/Behavioral:  Positive for depression and memory loss.   ?     Family reports decreased recall of the past year.  ?All other systems reviewed and are negative. ?    ?Past Medical History:  ?Diagnosis Date  ? Breast cancer ([place])    ? Colon polyps    ? Depression    ?  history of  ? Diverticulitis    ? GERD (gastroesophageal reflux disease)    ? Hypertension    ? Osteoporosis    ? Seizures ([place])    ? Stroke [facility])    ?  ?     ?Past Surgical History:  ?Procedure Laterality Date  ? BREAST LUMPECTOMY WITH RADIOACTIVE SEED LOCALIZATION Left 08/28/2020  ?  Procedure: LEFT BREAST LUMPECTOMY WITH RADIOACTIVE SEED LOCALIZATION;  Surgeon: Stark Klein, MD;  Location: [facility];  Service: General;  Laterality: Left;  RNFA  ? IR GENERIC HISTORICAL   08/05/2016  ?  IR ANGIO VERTEBRAL SEL VERTEBRAL UNI R MOD SED 08/05/2016 Luanne Bras, MD [facility]INTER[facility]  ? IR GENERIC HISTORICAL   08/05/2016  ?  IR ANGIO INTRA EXTRACRAN SEL COM CAROTID INNOMINATE BILAT MOD SED 08/05/2016 Luanne Bras, MD [facility]  ?  IR GENERIC HISTORICAL   08/05/2016  ?  IR ANGIO VERTEBRAL SEL SUBCLAVIAN INNOMINATE UNI L MOD SED 08/05/2016 Luanne Bras, MD [facility]  ? MOUTH SURGERY      ? TONSILLECTOMY      ? TUBAL LIGATION      ?  ?     ?Family History  ?Problem Relation Age of Onset  ? Heart disease Mother    ? Cancer Mother    ?      lung  ? Stroke Paternal Grandmother    ? Stomach cancer Maternal Grandfather    ?  ?Social History:  reports that she has never smoked. She has never used smokeless tobacco. She reports current alcohol use of about 1.0 standard drink  per week. She reports that she does not currently use drugs. ?Allergies:  ?     ?Allergies  ?Allergen Reactions  ? Other Anaphylaxis  ?    FIRE ANTS ?Insect stings / bites  ? Acyclovir And Related Other (See Comments)  ?    Unknown reaction  ? Codeine Other (See Comments)  ?    Syncope ?   ? Penicillins Rash  ?    Has patient had a PCN reaction causing immediate rash, facial/tongue/throat swelling, SOB or lightheadedness with hypotension: Unknown ?Has patient had a PCN reaction causing severe rash involving mucus membranes or skin necrosis: No ?Has patient had a PCN reaction that required hospitalization No ?Has patient had a PCN reaction occurring within the last 10 years: No ?If all of the above answers are "NO", then may proceed with Cephalosporin use. ?   ?  ?      ?Medications Prior to Admission  ?Medication Sig Dispense Refill  ? alendronate (FOSAMAX) 70 MG tablet TAKE 1 TABLET BY MOUTH EVERY 7 DAYS. TAKE WITH FULL GLASS OF WATER ON AN EMPTY STOMACH (Patient taking differently: Take 70 mg by mouth every Sunday. Take with full glass of water on an empty stomach.) 12 tablet 3  ? ALPRAZolam (XANAX) 0.5 MG tablet Take 1 tablet (0.5 mg total) by mouth 3 (three) times daily as needed for anxiety. 45 tablet 2  ? Calcium Carb-Cholecalciferol (CALCIUM CARBONATE-VITAMIN D3) 600-400 MG-UNIT TABS Take 1 tablet by mouth 2 (two) times daily.      ? Cetirizine HCl 10 MG TBDP Take 10 mg by mouth at bedtime. Aller-Tec (from [facility])      ? EPINEPHRINE 0.3 mg/0.3 mL IJ SOAJ injection USE AS DIRECTED (Patient taking differently: Inject 0.3 mg into the muscle as needed for anaphylaxis.) 2 each 1  ? famotidine (PEPCID) 20 MG tablet Take 1 tablet (20 mg total) by mouth 2 (two) times daily. 180 tablet 2  ? losartan (COZAAR) 100 MG tablet TAKE 1 TABLET BY MOUTH DAILY FOR BLOOD PRESSURE (Patient taking differently: Take 100 mg by mouth daily.) 90 tablet 2  ? venlafaxine XR (EFFEXOR-XR) 75 MG 24 hr capsule TAKE 1 CAPSULE BY MOUTH DAILY  WITH BREAKFAST. (Patient taking differently: Take 75 mg by mouth daily with breakfast.) 90 capsule 1  ? vitamin B-12 (CYANOCOBALAMIN) 1000 MCG tablet Take 1,000 mcg by mouth daily.      ? zinc gluconate 50 MG tablet Take 50 mg by mouth daily.      ? acetaminophen (TYLENOL) 500 MG tablet Take 1,000 mg by mouth every 6 (six) hours as needed for moderate pain or headache. (Patient not taking: Reported on 01/10/2022)      ? ferrous sulfate 325 (65 FE) MG tablet  Take 325 mg by mouth daily. (Patient not taking: Reported on 01/10/2022)      ?  ?  ?  ?  ?Home: ?Home Living ?Family/patient expects to be discharged to:: Private residence ?Living Arrangements: Children ?Available Help at Discharge: Family ?Type of Home: House ?Home Access: Stairs to enter ?Entrance Stairs-Number of Steps: 4 (back door 4 steps with rails) ?Entrance Stairs-Rails: None ?Home Layout: Able to live on main level with bedroom/bathroom ?Bathroom Shower/Tub: Tub/shower unit ?Bathroom Toilet: Standard ?Bathroom Accessibility: Yes ?Home Equipment: None ?Additional Comments: son works 3rd shift and home during the day ? Lives With: Son ?  ?Functional History: ?Prior Function ?Prior Level of Function : Independent/Modified Independent, Driving ?Mobility Comments: Retired Pharmacist, hospital, active ?  ?Functional Status:  ?Mobility: ?Bed Mobility ?Overal bed mobility: Needs Assistance ?Bed Mobility: Supine to Sit ?Rolling: Min guard ?Sidelying to sit: Mod assist ?Supine to sit: Mod assist, HOB elevated ?General bed mobility comments: constant max directional verbal cues given. Pt would initiate LE movement off EOB and then stop requiring more verbal cues to keep going and then modA to scoot hips forward so feet were on the ground ?Transfers ?Overall transfer level: Needs assistance ?Equipment used: 1 person hand held assist ?Transfers: Sit to/from Stand ?Sit to Stand: Mod assist, Max assist ?Bed to/from chair/wheelchair/BSC transfer type:: Stand pivot ?Stand pivot  transfers: Max assist ?Step pivot transfers: Max assist ?General transfer comment: pt resistant due to fear, retropulsive. attempted with RW first however unable and completed fact to face transfer, assist to help pt move L

## 2022-01-18 NOTE — Plan of Care (Signed)
?  Problem: Education: ?Goal: Knowledge of disease or condition will improve ?Outcome: Progressing ?  ?Problem: Coping: ?Goal: Ability to identify appropriate support needs will improve ?Outcome: Progressing ?  ?Problem: Safety: ?Goal: Verbalization of understanding the information provided will improve ?Outcome: Progressing ?  ?Problem: Self-Concept: ?Goal: Level of anxiety will decrease ?Outcome: Progressing ?  ?

## 2022-01-18 NOTE — Discharge Summary (Signed)
?Physician Discharge Summary ?  ?Patient: Cynthia [name] MRN: [id number] DOB: 1946/02/09  ?Admit date:     01/10/2022  ?Discharge date: 01/18/22  ?Discharge Physician: Edwin Dada  ? ?PCP: Susy Frizzle, MD  ? ? ? ?Recommendations at discharge:  ?Follow up with [facility] in 4-6 weeks ?Follow up with PCP in 1 week after d/c from SNF ?Avoid anticoagulants, aspirin, Plavix, and NSAIDs due to bleeding risk ?Continue resumed Keppra ? ? ? ? ?Discharge Diagnoses: ?Principal Problem: ?  ICH (intracerebral hemorrhage) likely due to cerebral amyloid angiopathy  ?Active Problems: ?  Benign essential HTN ?  Seizure ([place]) ?  Hyperlipidemia ?  Hypokalemia ? ? ? ?Hospital Course: ?Cynthia [name] is a 76 y.o. F with hx ICH 2017, hx partial seizure no longer on Keppra who presented with acute LEFT weakness. ? ?In the ER, CT showed a left frontoparietal intraparenchymal hemorrhage. ? ? ? ?* ICH (intracerebral hemorrhage) ([place]) ?Patient was admitted by neurology for intraparenchymal hemorrhage.  At presentation her blood pressure was normal and CAA was suspected. ? ?Subsequent MRI showed a stable hematoma, as well as multifocal previous hemorrhages, concerning for CAA.  MR angiogram showed no aneurysm, or AVM, carotid Dopplers were normal, 2D echo showed no cardiogenic source of embolism and normal ejection fraction. ? ?Lipids were ordered and showed LDL 94, statin deferred in the setting of CAA. ? ?A1c 5.7, she was not treated with aspirin due to her intracerebral hemorrhage.  Therapy recommended CIR. ? ? ?Seizure ([place]) ?During her work-up for intracerebral hemorrhage, the patient did have a generalized tonic-clonic seizure.  She was loaded with Keppra, and LTM EEG was started.  Eventually seizure activity ceased, and the patient's clinical status improved on new Keppra which was transitioned to oral and continue to discharge with close neurology follow-up.    ? ? ? ? ?  ? ? ? ? ? ?[facility] Controlled  Substances Registry was reviewed for this patient prior to discharge.  ? ?Consultants: Neurology ?Procedures performed: MRA head, CT head, MRI brain, Echo, Carotid doppler, continuous EEG  ?Disposition: Rehabilitation facility ? ? ?DISCHARGE MEDICATION: ?Allergies as of 01/18/2022   ? ?   Reactions  ? Other Anaphylaxis  ? FIRE ANTS ?Insect stings / bites  ? Acyclovir And Related Other (See Comments)  ? Unknown reaction  ? Codeine Other (See Comments)  ? Syncope  ? Penicillins Rash  ? Has patient had a PCN reaction causing immediate rash, facial/tongue/throat swelling, SOB or lightheadedness with hypotension: Unknown ?Has patient had a PCN reaction causing severe rash involving mucus membranes or skin necrosis: No ?Has patient had a PCN reaction that required hospitalization No ?Has patient had a PCN reaction occurring within the last 10 years: No ?If all of the above answers are "NO", then may proceed with Cephalosporin use.  ? ?  ? ?  ?Medication List  ?  ? ?TAKE these medications   ? ?acetaminophen 500 MG tablet ?Commonly known as: TYLENOL ?Take 1,000 mg by mouth every 6 (six) hours as needed for moderate pain or headache. ?  ?alendronate 70 MG tablet ?Commonly known as: FOSAMAX ?TAKE 1 TABLET BY MOUTH EVERY 7 DAYS. TAKE WITH FULL GLASS OF WATER ON AN EMPTY STOMACH ?What changed: See the new instructions. ?  ?ALPRAZolam 0.5 MG tablet ?Commonly known as: Duanne Moron ?Take 1 tablet (0.5 mg total) by mouth 3 (three) times daily as needed for anxiety. ?  ?Calcium Carbonate-Vitamin D3 600-400 MG-UNIT Tabs ?Take 1 tablet by  mouth 2 (two) times daily. ?  ?Cetirizine HCl 10 MG Tbdp ?Take 10 mg by mouth at bedtime. Aller-Tec (from [facility]) ?  ?EPINEPHrine 0.3 mg/0.3 mL Soaj injection ?Commonly known as: EPI-PEN ?USE AS DIRECTED ?What changed:  ?how much to take ?how to take this ?when to take this ?reasons to take this ?  ?famotidine 20 MG tablet ?Commonly known as: PEPCID ?Take 1 tablet (20 mg total) by mouth 2 (two) times  daily. ?  ?ferrous sulfate 325 (65 FE) MG tablet ?Take 325 mg by mouth daily. ?  ?levETIRAcetam 1000 MG tablet ?Commonly known as: KEPPRA ?Take 1 tablet (1,000 mg total) by mouth 2 (two) times daily. ?  ?losartan 100 MG tablet ?Commonly known as: COZAAR ?TAKE 1 TABLET BY MOUTH DAILY FOR BLOOD PRESSURE ?What changed: additional instructions ?  ?venlafaxine XR 75 MG 24 hr capsule ?Commonly known as: EFFEXOR-XR ?TAKE 1 CAPSULE BY MOUTH DAILY WITH BREAKFAST. ?  ?vitamin B-12 1000 MCG tablet ?Commonly known as: CYANOCOBALAMIN ?Take 1,000 mcg by mouth daily. ?  ?zinc gluconate 50 MG tablet ?Take 50 mg by mouth daily. ?  ? ?  ? ? Follow-up Information   ? ? Garvin Fila, MD. Schedule an appointment as soon as possible for a visit in 1 month(s).   ?Specialties: Neurology, Radiology ?Why: stroke clinic ?Contact information: ?[address] ?[address] ?[place] [address] ?[phone] ? ? ?  ?  ? ?  ?  ? ?  ? ? ?Discharge Instructions   ? ? Ambulatory referral to Neurology   Complete by: As directed ?  ? Follow up with Dr. Leonie Man at [facility] in 4-6 weeks.  Patient is Dr. Leonie Man patient in the past. Thanks.  ? ?  ? ? ?Discharge Exam: ?Filed Weights  ? 01/10/22 1100 01/11/22 0545  ?Weight: 63.2 kg 59.6 kg  ? ? ?General: Pt is alert, awake, not in acute distress ?Cardiovascular: RRR, nl S1-S2, no murmurs appreciated.   No LE edema.   ?Respiratory: Normal respiratory rate and rhythm.  CTAB without rales or wheezes. ?Abdominal: Abdomen soft and non-tender.  No distension or HSM.   ?Neuro/Psych: Strength dimininshed on the left.  Judgment and insight appear normal. ? ? ?Condition at discharge: good ? ?The results of significant diagnostics from this hospitalization (including imaging, microbiology, ancillary and laboratory) are listed below for reference.  ? ?Imaging Studies: ?CT HEAD WO CONTRAST (5MM) ? ?Result Date: 01/11/2022 ?CLINICAL DATA:  Seizure, generalized, abnormal neuro exam (Ped 0-17y) EXAM: CT HEAD WITHOUT CONTRAST  TECHNIQUE: Contiguous axial images were obtained from the base of the skull through the vertex without intravenous contrast. RADIATION DOSE REDUCTION: This exam was performed according to the departmental dose-optimization program which includes automated exposure control, adjustment of the mA and/or kV according to patient size and/or use of iterative reconstruction technique. COMPARISON:  CT head Jan 10, 2022. FINDINGS: Brain: Stable size of an intraparenchymal hemorrhage in the right parasagittal frontal lobe. Similar surrounding edema and regional mass effect with possible slight extension of hemorrhage into the adjacent extra-axial space. No midline shift. Similar ventriculomegaly. No evidence of acute large vascular territory infarct. Patchy white matter hypoattenuation, nonspecific but compatible with chronic microvascular ischemic disease. Cerebral atrophy. Vascular: No hyperdense vessel identified. Skull: No acute fracture. Sinuses/Orbits: Right maxillary sinus mucosal thickening frothy secretions. Otherwise, clear sinuses. No acute orbital findings. Other: No mastoid effusions. IMPRESSION: Unchanged high right parasagittal frontal lobe intraparenchymal hemorrhage with similar surrounding edema and mass effect. Electronically Signed   By:  Margaretha Sheffield M.D.   On: 01/11/2022 14:08  ? ?CT CERVICAL SPINE WO CONTRAST ? ?Result Date: 01/10/2022 ?CLINICAL DATA:  Neck trauma, uncomplicated (NEXUS/CCR neg) (Age 1-64y); Facial trauma, blunt EXAM: CT MAXILLOFACIAL WITHOUT CONTRAST CT CERVICAL SPINE WITHOUT CONTRAST TECHNIQUE: Multidetector CT imaging of the maxillofacial structures was performed. Multiplanar CT image reconstructions were also generated. A small metallic BB was placed on the right temple in order to reliably differentiate right from left. Multidetector CT imaging of the cervical spine was performed without intravenous contrast. Multiplanar CT image reconstructions were also generated. RADIATION DOSE  REDUCTION: This exam was performed according to the departmental dose-optimization program which includes automated exposure control, adjustment of the mA and/or kV according to patient size and/or use of iterat

## 2022-01-19 ENCOUNTER — Encounter (HOSPITAL_COMMUNITY): Payer: Self-pay | Admitting: Physical Medicine & Rehabilitation

## 2022-01-19 ENCOUNTER — Other Ambulatory Visit: Payer: Self-pay

## 2022-01-19 ENCOUNTER — Inpatient Hospital Stay (HOSPITAL_COMMUNITY)
Admission: RE | Admit: 2022-01-19 | Discharge: 2022-02-09 | DRG: 057 | Disposition: A | Discharge status: Skilled Nursing Facility | Payer: Medicare PPO | Source: Intra-hospital | Attending: Physical Medicine & Rehabilitation | Admitting: Physical Medicine & Rehabilitation

## 2022-01-19 DIAGNOSIS — I69254 Hemiplegia and hemiparesis following other nontraumatic intracranial hemorrhage affecting left non-dominant side: Secondary | ICD-10-CM | POA: Diagnosis not present

## 2022-01-19 DIAGNOSIS — R630 Anorexia: Secondary | ICD-10-CM | POA: Diagnosis not present

## 2022-01-19 DIAGNOSIS — Z9181 History of falling: Secondary | ICD-10-CM | POA: Diagnosis not present

## 2022-01-19 DIAGNOSIS — F32A Depression, unspecified: Secondary | ICD-10-CM | POA: Diagnosis not present

## 2022-01-19 DIAGNOSIS — Z8249 Family history of ischemic heart disease and other diseases of the circulatory system: Secondary | ICD-10-CM

## 2022-01-19 DIAGNOSIS — R414 Neurologic neglect syndrome: Secondary | ICD-10-CM | POA: Diagnosis not present

## 2022-01-19 DIAGNOSIS — R3915 Urgency of urination: Secondary | ICD-10-CM | POA: Diagnosis present

## 2022-01-19 DIAGNOSIS — Z823 Family history of stroke: Secondary | ICD-10-CM

## 2022-01-19 DIAGNOSIS — R350 Frequency of micturition: Secondary | ICD-10-CM | POA: Diagnosis present

## 2022-01-19 DIAGNOSIS — I611 Nontraumatic intracerebral hemorrhage in hemisphere, cortical: Secondary | ICD-10-CM | POA: Diagnosis not present

## 2022-01-19 DIAGNOSIS — Z7401 Bed confinement status: Secondary | ICD-10-CM | POA: Diagnosis not present

## 2022-01-19 DIAGNOSIS — B9620 Unspecified Escherichia coli [E. coli] as the cause of diseases classified elsewhere: Secondary | ICD-10-CM | POA: Diagnosis not present

## 2022-01-19 DIAGNOSIS — M2550 Pain in unspecified joint: Secondary | ICD-10-CM | POA: Diagnosis not present

## 2022-01-19 DIAGNOSIS — I10 Essential (primary) hypertension: Secondary | ICD-10-CM | POA: Diagnosis not present

## 2022-01-19 DIAGNOSIS — N3000 Acute cystitis without hematuria: Secondary | ICD-10-CM | POA: Diagnosis not present

## 2022-01-19 DIAGNOSIS — N390 Urinary tract infection, site not specified: Secondary | ICD-10-CM | POA: Diagnosis not present

## 2022-01-19 DIAGNOSIS — S32020S Wedge compression fracture of second lumbar vertebra, sequela: Secondary | ICD-10-CM | POA: Diagnosis not present

## 2022-01-19 DIAGNOSIS — I619 Nontraumatic intracerebral hemorrhage, unspecified: Secondary | ICD-10-CM | POA: Diagnosis not present

## 2022-01-19 DIAGNOSIS — R197 Diarrhea, unspecified: Secondary | ICD-10-CM | POA: Diagnosis not present

## 2022-01-19 DIAGNOSIS — I610 Nontraumatic intracerebral hemorrhage in hemisphere, subcortical: Secondary | ICD-10-CM

## 2022-01-19 DIAGNOSIS — R252 Cramp and spasm: Secondary | ICD-10-CM | POA: Diagnosis not present

## 2022-01-19 DIAGNOSIS — E871 Hypo-osmolality and hyponatremia: Secondary | ICD-10-CM | POA: Diagnosis not present

## 2022-01-19 DIAGNOSIS — K5900 Constipation, unspecified: Secondary | ICD-10-CM | POA: Diagnosis not present

## 2022-01-19 DIAGNOSIS — I159 Secondary hypertension, unspecified: Secondary | ICD-10-CM | POA: Diagnosis not present

## 2022-01-19 DIAGNOSIS — Z853 Personal history of malignant neoplasm of breast: Secondary | ICD-10-CM | POA: Diagnosis not present

## 2022-01-19 DIAGNOSIS — M810 Age-related osteoporosis without current pathological fracture: Secondary | ICD-10-CM | POA: Diagnosis not present

## 2022-01-19 DIAGNOSIS — G40909 Epilepsy, unspecified, not intractable, without status epilepticus: Secondary | ICD-10-CM | POA: Diagnosis not present

## 2022-01-19 DIAGNOSIS — S0181XD Laceration without foreign body of other part of head, subsequent encounter: Secondary | ICD-10-CM | POA: Diagnosis not present

## 2022-01-19 DIAGNOSIS — M79603 Pain in arm, unspecified: Secondary | ICD-10-CM | POA: Diagnosis not present

## 2022-01-19 DIAGNOSIS — S2242XD Multiple fractures of ribs, left side, subsequent encounter for fracture with routine healing: Secondary | ICD-10-CM | POA: Diagnosis not present

## 2022-01-19 DIAGNOSIS — S066X0A Traumatic subarachnoid hemorrhage without loss of consciousness, initial encounter: Secondary | ICD-10-CM | POA: Diagnosis not present

## 2022-01-19 DIAGNOSIS — K635 Polyp of colon: Secondary | ICD-10-CM | POA: Diagnosis not present

## 2022-01-19 DIAGNOSIS — Z7983 Long term (current) use of bisphosphonates: Secondary | ICD-10-CM | POA: Diagnosis not present

## 2022-01-19 DIAGNOSIS — Z800 Family history of malignant neoplasm of digestive organs: Secondary | ICD-10-CM | POA: Diagnosis not present

## 2022-01-19 DIAGNOSIS — I69114 Frontal lobe and executive function deficit following nontraumatic intracerebral hemorrhage: Secondary | ICD-10-CM | POA: Diagnosis not present

## 2022-01-19 DIAGNOSIS — K219 Gastro-esophageal reflux disease without esophagitis: Secondary | ICD-10-CM | POA: Diagnosis present

## 2022-01-19 DIAGNOSIS — G936 Cerebral edema: Secondary | ICD-10-CM | POA: Diagnosis not present

## 2022-01-19 DIAGNOSIS — F0630 Mood disorder due to known physiological condition, unspecified: Secondary | ICD-10-CM

## 2022-01-19 DIAGNOSIS — S065X0D Traumatic subdural hemorrhage without loss of consciousness, subsequent encounter: Secondary | ICD-10-CM | POA: Diagnosis not present

## 2022-01-19 DIAGNOSIS — S069XAA Unspecified intracranial injury with loss of consciousness status unknown, initial encounter: Secondary | ICD-10-CM | POA: Diagnosis not present

## 2022-01-19 DIAGNOSIS — D62 Acute posthemorrhagic anemia: Secondary | ICD-10-CM | POA: Diagnosis not present

## 2022-01-19 DIAGNOSIS — R531 Weakness: Secondary | ICD-10-CM | POA: Diagnosis not present

## 2022-01-19 DIAGNOSIS — G40209 Localization-related (focal) (partial) symptomatic epilepsy and epileptic syndromes with complex partial seizures, not intractable, without status epilepticus: Secondary | ICD-10-CM | POA: Diagnosis present

## 2022-01-19 DIAGNOSIS — Z79899 Other long term (current) drug therapy: Secondary | ICD-10-CM | POA: Diagnosis not present

## 2022-01-19 LAB — COMPREHENSIVE METABOLIC PANEL
ALT: 20 U/L (ref 0–44)
AST: 18 U/L (ref 15–41)
Albumin: 3.1 g/dL — ABNORMAL LOW (ref 3.5–5.0)
Alkaline Phosphatase: 56 U/L (ref 38–126)
Anion gap: 6 (ref 5–15)
BUN: 19 mg/dL (ref 8–23)
CO2: 28 mmol/L (ref 22–32)
Calcium: 9 mg/dL (ref 8.9–10.3)
Chloride: 105 mmol/L (ref 98–111)
Creatinine, Ser: 0.68 mg/dL (ref 0.44–1.00)
GFR, Estimated: >60 mL/min (ref >60)
Glucose, Bld: 104 mg/dL — ABNORMAL HIGH (ref 70–99)
Potassium: 3.6 mmol/L (ref 3.5–5.1)
Sodium: 139 mmol/L (ref 135–145)
Total Bilirubin: 0.9 mg/dL (ref 0.3–1.2)
Total Protein: 5.9 g/dL — ABNORMAL LOW (ref 6.5–8.1)

## 2022-01-19 LAB — CBC WITH DIFFERENTIAL/PLATELET
Abs Immature Granulocytes: 0.01 10*3/uL (ref 0.00–0.07)
Basophils Absolute: 0 10*3/uL (ref 0.0–0.1)
Basophils Relative: 1 %
Eosinophils Absolute: 0.1 10*3/uL (ref 0.0–0.5)
Eosinophils Relative: 1 %
HCT: 36.1 % (ref 36.0–46.0)
Hemoglobin: 12.6 g/dL (ref 12.0–15.0)
Immature Granulocytes: 0 %
Lymphocytes Relative: 28 %
Lymphs Abs: 1.8 10*3/uL (ref 0.7–4.0)
MCH: 30.9 pg (ref 26.0–34.0)
MCHC: 34.9 g/dL (ref 30.0–36.0)
MCV: 88.5 fL (ref 80.0–100.0)
Monocytes Absolute: 0.7 10*3/uL (ref 0.1–1.0)
Monocytes Relative: 11 %
Neutro Abs: 3.8 10*3/uL (ref 1.7–7.7)
Neutrophils Relative %: 59 %
Platelets: 298 10*3/uL (ref 150–400)
RBC: 4.08 MIL/uL (ref 3.87–5.11)
RDW: 12.6 % (ref 11.5–15.5)
WBC: 6.5 10*3/uL (ref 4.0–10.5)
nRBC: 0 % (ref 0.0–0.2)

## 2022-01-19 MED ORDER — FERROUS SULFATE 325 (65 FE) MG PO TABS
325.0000 mg | ORAL_TABLET | Freq: Every day | ORAL | Status: DC
  Administered 2022-01-19 – 2022-02-09 (×22): 325 mg via ORAL
  Filled 2022-01-19 (×22): qty 1

## 2022-01-19 MED ORDER — ALPRAZOLAM 0.5 MG PO TABS
0.5000 mg | ORAL_TABLET | Freq: Three times a day (TID) | ORAL | Status: DC | PRN
  Administered 2022-01-19 – 2022-01-20 (×2): 0.5 mg via ORAL
  Filled 2022-01-19 (×2): qty 1

## 2022-01-19 MED ORDER — LOSARTAN POTASSIUM 50 MG PO TABS
100.0000 mg | ORAL_TABLET | Freq: Every day | ORAL | Status: DC
  Administered 2022-01-19 – 2022-02-09 (×22): 100 mg via ORAL
  Filled 2022-01-19 (×23): qty 2

## 2022-01-19 MED ORDER — LEVETIRACETAM 250 MG PO TABS
1000.0000 mg | ORAL_TABLET | Freq: Two times a day (BID) | ORAL | Status: DC
  Administered 2022-01-19 – 2022-02-09 (×43): 1000 mg via ORAL
  Filled 2022-01-19 (×44): qty 4

## 2022-01-19 MED ORDER — FAMOTIDINE 20 MG PO TABS
20.0000 mg | ORAL_TABLET | Freq: Two times a day (BID) | ORAL | Status: DC
  Administered 2022-01-19 – 2022-02-09 (×43): 20 mg via ORAL
  Filled 2022-01-19 (×43): qty 1

## 2022-01-19 MED ORDER — VITAMIN B-12 1000 MCG PO TABS
1000.0000 ug | ORAL_TABLET | Freq: Every day | ORAL | Status: DC
Start: 2022-01-19 — End: 2022-02-09
  Administered 2022-01-19 – 2022-02-09 (×22): 1000 ug via ORAL
  Filled 2022-01-19 (×22): qty 1

## 2022-01-19 MED ORDER — VENLAFAXINE HCL 75 MG PO TABS
75.0000 mg | ORAL_TABLET | Freq: Two times a day (BID) | ORAL | Status: DC
  Filled 2022-01-19: qty 1

## 2022-01-19 MED ORDER — VENLAFAXINE HCL 75 MG PO TABS
75.0000 mg | ORAL_TABLET | Freq: Every day | ORAL | Status: DC
  Administered 2022-01-19 – 2022-02-08 (×21): 75 mg via ORAL
  Filled 2022-01-19 (×21): qty 1

## 2022-01-19 NOTE — Progress Notes (Signed)
Inpatient Rehabilitation Center ?Individual Statement of Services ? ?Patient Name:  Cynthia [name]  ?Date:  01/19/2022 ? ?Welcome to the [facility].  Our goal is to provide you with an individualized program based on your diagnosis and situation, designed to meet your specific needs.  With this comprehensive rehabilitation program, you will be expected to participate in at least 3 hours of rehabilitation therapies Monday-Friday, with modified therapy programming on the weekends. ? ?Your rehabilitation program will include the following services:  Physical Therapy (PT), Occupational Therapy (OT), Speech Therapy (ST), 24 hour per day rehabilitation nursing, Therapeutic Recreaction (TR), Neuropsychology, Care Coordinator, Rehabilitation Medicine, Nutrition Services, and Pharmacy Services ? ?Weekly team conferences will be held on Wednesday to discuss your progress.  Your Inpatient Rehabilitation Care Coordinator will talk with you frequently to get your input and to update you on team discussions.  Team conferences with you and your family in attendance may also be held. ? ?Expected length of stay: 2-3 weeks  Overall anticipated outcome: CGA-min assist ? ?Depending on your progress and recovery, your program may change. Your Inpatient Rehabilitation Care Coordinator will coordinate services and will keep you informed of any changes. Your Inpatient Rehabilitation Care Coordinator's name and contact numbers are listed  below. ? ?The following services may also be recommended but are not provided by the [facility]:  ?Driving Evaluations ?Home Health Rehabiltiation Services ?Outpatient Rehabilitation Services ? ?  ?Arrangements will be made to provide these services after discharge if needed.  Arrangements include referral to agencies that provide these services. ? ?Your insurance has been verified to be:  [employer] ?Your primary doctor is:   ? ? ?Pertinent information will  be shared with your doctor and your insurance company. ? ?Inpatient Rehabilitation Care Coordinator:  Ovidio Kin, [place] or (C) [phone] ? ?Information discussed with and copy given to patient by: Elease Hashimoto, 01/19/2022, 11:00 AM    ?

## 2022-01-19 NOTE — Evaluation (Signed)
Occupational Therapy Assessment and Plan ? ?Patient Details  ?Name: Cynthia [name] ?MRN: [id number] ?Date of Birth: 1945-11-04 ? ?OT Diagnosis: abnormal posture, acute pain, disturbance of vision, muscle weakness (generalized), and incoordination and impaired motor planning, cognitive deficits ?Rehab Potential: Rehab Potential (ACUTE ONLY): Fair ?ELOS: 2-3 weeks  ? ?Today's Date: 01/19/2022 ?OT Individual Time: 0272-5366 ?OT Individual Time Calculation (min): 72 min    ? ?Hospital Problem: Principal Problem: ?  Intracerebral hemorrhage ?Active Problems: ?  ICH (intracerebral hemorrhage) ([place]) ?  Left-sided visual neglect ?  Partial symptomatic epilepsy with complex partial seizures, not intractable, without status epilepticus ([place]) ? ? ?Past Medical History:  ?Past Medical History:  ?Diagnosis Date  ? Breast cancer ([place])   ? Colon polyps   ? Depression   ? history of  ? Diverticulitis   ? GERD (gastroesophageal reflux disease)   ? Hypertension   ? Osteoporosis   ? Seizures ([place])   ? Stroke [facility])   ? ?Past Surgical History:  ?Past Surgical History:  ?Procedure Laterality Date  ? BREAST LUMPECTOMY WITH RADIOACTIVE SEED LOCALIZATION Left 08/28/2020  ? Procedure: LEFT BREAST LUMPECTOMY WITH RADIOACTIVE SEED LOCALIZATION;  Surgeon: Stark Klein, MD;  Location: [place];  Service: General;  Laterality: Left;  RNFA  ? IR GENERIC HISTORICAL  08/05/2016  ? IR ANGIO VERTEBRAL SEL VERTEBRAL UNI R MOD SED 08/05/2016 Luanne Bras, MD [facility]  ? IR GENERIC HISTORICAL  08/05/2016  ? IR ANGIO INTRA EXTRACRAN SEL COM CAROTID INNOMINATE BILAT MOD SED 08/05/2016 Luanne Bras, MD [facility]  ? IR GENERIC HISTORICAL  08/05/2016  ? IR ANGIO VERTEBRAL SEL SUBCLAVIAN INNOMINATE UNI L MOD SED 08/05/2016 Luanne Bras, MD [facility]  ? MOUTH SURGERY    ? TONSILLECTOMY    ? TUBAL LIGATION    ? ? ?Assessment & Plan ?Clinical Impression:  ? ?Cynthia [name] is a 76 year old right-handed female with history of  hypertension, right frontal ICH 2017, breast cancer with lumpectomy with radioactive seed localization 08/28/2020.  Per chart review patient lives with her children.  Bed bath main level of home with 4 steps to entry.  Son works third shift at home during the day.  She is a retired Pharmacist, hospital reportedly independent prior to admission.  Presented 01/10/2022 with acute onset of left-sided weakness and slid out of bed.  She attempted to stand hitting the back of her head without loss of consciousness.  Cranial CT scan showed intraparenchymal hematoma in the right parasagittal frontal lobe measuring 3.0 cm AP by 2.3 cm TV by 3.0 cm with mild surrounding edema but no midline shift.  CT cervical spine maxillofacial negative.  Initial EEG negative.  Overnight EEG with video showed seizure arising from right frontal temporal region.  Admission chemistry unremarkable except glucose 108, WBC 12,700.  Echocardiogram ejection fraction of 60 to 65% no wall motion abnormalities.  Patient has been loaded with Keppra for seizure prophylaxis.  Follow-up MRI of the brain as well as angio head and neck showed acute/subacute hematoma right pericallosal frontal lobe unchanged.  MRA negative.  Tolerating a regular diet.  Therapy evaluations completed due to patient decreased functional mobility was admitted for a comprehensive rehab program. Patient transferred to CIR on 01/19/2022 .   ? ?Patient currently requires max with basic self-care skills secondary to muscle weakness, decreased cardiorespiratoy endurance, decreased coordination and decreased motor planning, field cut, and decreased sitting balance and decreased standing balance.  Prior to hospitalization, patient could complete all self-care independently. ? ?Patient will benefit  from skilled intervention to decrease level of assist with basic self-care skills, increase independence with basic self-care skills, and increase level of independence with iADL prior to discharge with son  who works 3rd shift.  Anticipate patient will require 24 hour supervision and follow up home health. ? ?OT - End of Session ?Activity Tolerance: Tolerates < 10 min activity, no significant change in vital signs ?Endurance Deficit: Yes ?Endurance Deficit Description: required rest breaks between self-care tasks ?OT Assessment ?Rehab Potential (ACUTE ONLY): Fair ?OT Barriers to Discharge: Home environment access/layout;Lack of/limited family support;Incontinence ?OT Patient demonstrates impairments in the following area(s): Balance;Cognition;Endurance;Vision;Skin Integrity;Perception;Safety;Pain;Motor ?OT Basic ADL's Functional Problem(s): Bathing;Dressing;Toileting ?OT Advanced ADL's Functional Problem(s): Simple Meal Preparation ?OT Transfers Functional Problem(s): Toilet;Tub/Shower ?OT Additional Impairment(s): Fuctional Use of Upper Extremity ?OT Plan ?OT Intensity: Minimum of 1-2 x/day, 45 to 90 minutes ?OT Frequency: 5 out of 7 days ?OT Duration/Estimated Length of Stay: 2-3 weeks ?OT Treatment/Interventions: Balance/vestibular training;Discharge planning;Pain management;Self Care/advanced ADL retraining;Therapeutic Activities;UE/LE Coordination activities;Cognitive remediation/compensation;Functional mobility training;Disease mangement/prevention;Patient/family education;Skin care/wound managment;Therapeutic Exercise;Visual/perceptual remediation/compensation;DME/adaptive equipment instruction;Neuromuscular re-education;Psychosocial support;UE/LE Strength taining/ROM;Wheelchair propulsion/positioning ?OT Self Feeding Anticipated Outcome(s): Mod I ?OT Basic Self-Care Anticipated Outcome(s): Supervision ?OT Toileting Anticipated Outcome(s): CGA ?OT Bathroom Transfers Anticipated Outcome(s): CGA ?OT Recommendation ?Patient destination: Home ?Follow Up Recommendations: Home health OT ?Equipment Recommended: To be determined ? ? ?OT Evaluation ?Precautions/Restrictions  ?Precautions ?Precautions: Fall ?Precaution  Comments: L LE extensor tone ?Restrictions ?Weight Bearing Restrictions: No ?Other Position/Activity Restrictions: tendency to stand on toes on L foot due to extension tone/L ankle contracture ?Home Living/Prior Functioning ?Home Living ?Family/patient expects to be discharged to:: Private residence ?Living Arrangements: Children ?Available Help at Discharge: Family (son available during day but works 3rd shift) ?Type of Home: House ?Home Access: Stairs to enter ?Entrance Stairs-Number of Steps: 4 steps with rail at back entrance, no rails on front entrance ?Home Layout: Able to live on main level with bedroom/bathroom ?Bathroom Shower/Tub: Tub/shower unit ?Bathroom Toilet: Standard ?Bathroom Accessibility: Yes ?Additional Comments: son works 3rd shift and home during the day ? Lives With: Son ?IADL History ?Homemaking Responsibilities: Yes ?Meal Prep Responsibility: Primary ?Current License: Yes ?Mode of Transportation: Car ?Type of Occupation: retired Pharmacist, hospital ?Prior Function ?Level of Independence: Independent with basic ADLs, Independent with gait ? Able to Take Stairs?: Reciprically ?Driving: Yes ?Vocation: Retired ?Vision ?Baseline Vision/History: 1 Wears glasses (night driving and reading only) ?Ability to See in Adequate Light: 0 Adequate ?Patient Visual Report: Eye fatigue/eye pain/headache ?Vision Assessment?: Yes ?Eye Alignment: Within Functional Limits ?Alignment/Gaze Preference: Within Defined Limits ?Tracking/Visual Pursuits: Decreased smoothness of horizontal tracking;Decreased smoothness of vertical tracking ?Saccades: Overshoots;Additional eye shifts occurred during testing ?Convergence: Impaired (comment) ?Visual Fields: Left visual field deficit ?Additional Comments: L inattention at baseline from prior stroke 2017 ?Perception  ?Perception: Impaired ?Inattention/Neglect: Does not attend to left visual field;Does not attend to left side of body ?Praxis ?Praxis: Impaired ?Praxis Impairment Details:  Motor planning;Initiation ?Cognition ?Cognition ?Overall Cognitive Status: No family/caregiver present to determine baseline cognitive functioning ?Arousal/Alertness: Awake/alert ?Memory: Appears intact ?Awareness:

## 2022-01-19 NOTE — Progress Notes (Signed)
Admitted this morning (3 am) to the unit . ? VS stable. Alert and oriented. Skin inspection done with one RN. Denies pain or anxiety. Made comfortable in bed. Rehab routine explained to patient. Due PT/OT evaluation this morning. Admission documentation complete. Safety protocols followed. ?

## 2022-01-19 NOTE — Progress Notes (Signed)
?                                                       PROGRESS NOTE ? ? ?Subjective/Complaints: ?Pt reports no new concerns this AM. She reports purewick was beneficial.  Reports she is ready to work hard with therapy. ? ?Review of Systems  ?Constitutional:  Negative for chills and fever.  ?Respiratory:  Negative for shortness of breath.   ?Cardiovascular:  Negative for chest pain.  ?Gastrointestinal:  Negative for abdominal pain, constipation, diarrhea, nausea and vomiting.  ?Musculoskeletal:  Negative for back pain and joint pain.   ? ?Objective: ?  ?No results found. ?Recent Labs  ?  01/19/22 ?4765  ?WBC 6.5  ?HGB 12.6  ?HCT 36.1  ?PLT 298  ? ?Recent Labs  ?  01/19/22 ?4650  ?NA 139  ?K 3.6  ?CL 105  ?CO2 28  ?GLUCOSE 104*  ?BUN 19  ?CREATININE 0.68  ?CALCIUM 9.0  ? ? ?Intake/Output Summary (Last 24 hours) at 01/19/2022 0753 ?Last data filed at 01/19/2022 0315 ?Gross per 24 hour  ?Intake --  ?Output 200 ml  ?Net -200 ml  ?  ? ?  ? ?Physical Exam: ?Vital Signs ?Blood pressure 130/72, pulse 85, temperature 98.1 ?F (36.7 ?C), temperature source Oral, resp. rate 15, height '5\' 6"'$  (1.676 m), weight 61.4 kg, SpO2 100 %. ? ? ? ?Assessment/Plan: ?1. Functional deficits which require 3+ hours per day of interdisciplinary therapy in a comprehensive inpatient rehab setting. ?Physiatrist is providing close team supervision and 24 hour management of active medical problems listed below. ?Physiatrist and rehab team continue to assess barriers to discharge/monitor patient progress toward functional and medical goals ? ?Care Tool: ? ?Bathing ?   ?   ?   ?  ?  ?Bathing assist   ?  ?  ?Upper Body Dressing/Undressing ?Upper body dressing   ?  ?   ?Upper body assist   ?   ?Lower Body Dressing/Undressing ?Lower body dressing ? ? ?   ?  ? ?  ? ?Lower body assist   ?   ? ?Toileting ?Toileting    ?Toileting assist   ?  ?  ?Transfers ?Chair/bed transfer ? ?Transfers assist ?   ? ?  ?  ?  ?Locomotion ?Ambulation ? ? ?Ambulation assist ? ?    ? ?  ?  ?   ? ?Walk 10 feet activity ? ? ?Assist ?   ? ?  ?   ? ?Walk 50 feet activity ? ? ?Assist   ? ?  ?   ? ? ?Walk 150 feet activity ? ? ?Assist   ? ?  ?  ?  ? ?Walk 10 feet on uneven surface  ?activity ? ? ?Assist   ? ? ?  ?   ? ?Wheelchair ? ? ? ? ?Assist   ?  ?  ? ?  ?   ? ? ?Wheelchair 50 feet with 2 turns activity ? ? ? ?Assist ? ?  ?  ? ? ?   ? ?Wheelchair 150 feet activity  ? ? ? ?Assist ?   ? ? ?   ? ?Blood pressure 130/72, pulse 85, temperature 98.1 ?F (36.7 ?C), temperature source Oral, resp. rate 15, height '5\' 6"'$  (1.676 m), weight 61.4 kg, SpO2 100 %. ? ? ? ?  General: Alert, No apparent distress. Sitting in bed ?HEENT: Head is normocephalic,  PERRLA, EOMI, sclera anicteric, oral mucosa pink and moist, bruising noted on face ?Neck: Supple without JVD or lymphadenopathy ?Heart: Reg rate and rhythm. No murmurs rubs or gallops ?Chest: CTA bilaterally without wheezes, rales, or rhonchi; no distress ?Abdomen: Soft, non-tender, non-distended, bowel sounds positive. ?Extremities: No clubbing, cyanosis, or edema. Pulses are 2+ ?Psych: Pt's affect is appropriate. Pt is cooperative ?Skin: Clean and intact without signs of breakdown ?Neuro:  Alert. Some neglect of left side ?CN 2-12 intact other than possibly decreased shrug on Left ?Musculoskeletal: 5/5 RUE and RLE ?3-4-/5 in LUE ?L HF 4-/5, Knee extension 4/5, Ankle FP/DF 3/5 ?RUE bruising  ?Midline RUE ?No ataxia or dysmetria noted ? ?Medical Problem List and Plan: ?1. Functional deficits secondary to intraparenchymal hematoma ?            -patient may  shower ?            -ELOS/Goals: 12-14 days supervision ?            -Evaluations today at CIR for PT, OT and SLP ?2.  Antithrombotics: ?-DVT/anticoagulation:  Mechanical: Antiembolism stockings, thigh (TED hose) Bilateral lower extremities ?            No Lovenoxetc due to IPH ?            -antiplatelet therapy: N/A ?3. Pain Management: ?As needed ?4. Mood: Effexor 75 mg daily ?            -antipsychotic  agents: N/A ? -Reports mood is good overall ?5. Neuropsych: This patient is capable of making decisions on her own behalf. ?6. Skin/Wound Care: Routine skin checks ?  ?7. Fluids/Electrolytes/Nutrition: Routine in and out with follow-up chemistries ? -DC midline 9/10 ?8.  Seizure disorder- new onset since IPH-.  Keppra 1000 mg every 12 hours ?9.  Hypertension.  Cozaar 100 mg daily.  Monitor with increased mobility ? -5/10 BP appears well controlled this AM, continue cozaar ?10.  History of left breast cancer with radioactive seed localization 08/28/2020.  Follow-up outpatient ?11. Hx of R frontal ICH 2017 ?12. Poor appetite- might need supplements or appetite stimulants ? -5/10 albumin a little low, Encouraged PO intake ? ?LOS: ?0 days ?A FACE TO FACE EVALUATION WAS PERFORMED ? ?Jennye Boroughs ?01/19/2022, 7:53 AM  ? ?  ?

## 2022-01-19 NOTE — Plan of Care (Signed)
?  Problem: RH Balance ?Goal: LTG Patient will maintain dynamic sitting balance (PT) ?Description: LTG:  Patient will maintain dynamic sitting balance with assistance during mobility activities (PT) ?Flowsheets (Taken 01/19/2022 1539) ?LTG: Pt will maintain dynamic sitting balance during mobility activities with:: Supervision/Verbal cueing ?Goal: LTG Patient will maintain dynamic standing balance (PT) ?Description: LTG:  Patient will maintain dynamic standing balance with assistance during mobility activities (PT) ?Flowsheets (Taken 01/19/2022 1539) ?LTG: Pt will maintain dynamic standing balance during mobility activities with:: Contact Guard/Touching assist ?  ?Problem: Sit to Stand ?Goal: LTG:  Patient will perform sit to stand with assistance level (PT) ?Description: LTG:  Patient will perform sit to stand with assistance level (PT) ?Flowsheets (Taken 01/19/2022 1539) ?LTG: PT will perform sit to stand in preparation for functional mobility with assistance level: Contact Guard/Touching assist ?  ?Problem: RH Bed Mobility ?Goal: LTG Patient will perform bed mobility with assist (PT) ?Description: LTG: Patient will perform bed mobility with assistance, with/without cues (PT). ?Flowsheets (Taken 01/19/2022 1539) ?LTG: Pt will perform bed mobility with assistance level of: Supervision/Verbal cueing ?  ?Problem: RH Bed to Chair Transfers ?Goal: LTG Patient will perform bed/chair transfers w/assist (PT) ?Description: LTG: Patient will perform bed to chair transfers with assistance (PT). ?Flowsheets (Taken 01/19/2022 1539) ?LTG: Pt will perform Bed to Chair Transfers with assistance level: Contact Guard/Touching assist ?  ?Problem: RH Car Transfers ?Goal: LTG Patient will perform car transfers with assist (PT) ?Description: LTG: Patient will perform car transfers with assistance (PT). ?Flowsheets (Taken 01/19/2022 1539) ?LTG: Pt will perform car transfers with assist:: Minimal Assistance - Patient > 75% ?  ?Problem: RH  Ambulation ?Goal: LTG Patient will ambulate in home environment (PT) ?Description: LTG: Patient will ambulate in home environment, # of feet with assistance (PT). ?Flowsheets (Taken 01/19/2022 1539) ?LTG: Pt will ambulate in home environ  assist needed:: Minimal Assistance - Patient > 75% ?LTG: Ambulation distance in home environment: 25 ?  ?Problem: RH Wheelchair Mobility ?Goal: LTG Patient will propel w/c in controlled environment (PT) ?Description: LTG: Patient will propel wheelchair in controlled environment, # of feet with assist (PT) ?Flowsheets (Taken 01/19/2022 1539) ?LTG: Pt will propel w/c in controlled environ  assist needed:: Supervision/Verbal cueing ?LTG: Propel w/c distance in controlled environment: 150 ?Goal: LTG Patient will propel w/c in home environment (PT) ?Description: LTG: Patient will propel wheelchair in home environment, # of feet with assistance (PT). ?Flowsheets (Taken 01/19/2022 1539) ?LTG: Pt will propel w/c in home environ  assist needed:: Supervision/Verbal cueing ?LTG: Propel w/c distance in home environment: 50 ?  ?Problem: RH Stairs ?Goal: LTG Patient will ambulate up and down stairs w/assist (PT) ?Description: LTG: Patient will ambulate up and down # of stairs with assistance (PT) ?Flowsheets (Taken 01/19/2022 1539) ?LTG: Pt will ambulate up/down stairs assist needed:: Minimal Assistance - Patient > 75% ?LTG: Pt will  ambulate up and down number of stairs: 4 ?Note: HR per home set up ?  ?

## 2022-01-19 NOTE — Evaluation (Signed)
Speech Language Pathology Assessment and Plan ? ?Patient Details  ?Name: Cynthia [name] ?MRN: [id number] ?Date of Birth: September 03, 1946 ? ?SLP Diagnosis: Cognitive Impairments  ?Rehab Potential: Good ?ELOS: 12-14 days  ? ? ?Today's Date: 01/19/2022 ?SLP Individual Time: 1000-1100 ?SLP Individual Time Calculation (min): 60 min ? ? ?Hospital Problem: Principal Problem: ?  Intracerebral hemorrhage ?Active Problems: ?  ICH (intracerebral hemorrhage) ([place]) ?  Left-sided visual neglect ?  Partial symptomatic epilepsy with complex partial seizures, not intractable, without status epilepticus ([place]) ?  Mood disorder in conditions classified elsewhere ? ?Past Medical History:  ?Past Medical History:  ?Diagnosis Date  ? Breast cancer ([place])   ? Colon polyps   ? Depression   ? history of  ? Diverticulitis   ? GERD (gastroesophageal reflux disease)   ? Hypertension   ? Osteoporosis   ? Seizures ([place])   ? Stroke [facility])   ? ?Past Surgical History:  ?Past Surgical History:  ?Procedure Laterality Date  ? BREAST LUMPECTOMY WITH RADIOACTIVE SEED LOCALIZATION Left 08/28/2020  ? Procedure: LEFT BREAST LUMPECTOMY WITH RADIOACTIVE SEED LOCALIZATION;  Surgeon: Stark Klein, MD;  Location: [facility];  Service: General;  Laterality: Left;  RNFA  ? IR GENERIC HISTORICAL  08/05/2016  ? IR ANGIO VERTEBRAL SEL VERTEBRAL UNI R MOD SED 08/05/2016 Luanne Bras, MD [facility]  ? IR GENERIC HISTORICAL  08/05/2016  ? IR ANGIO INTRA EXTRACRAN SEL COM CAROTID INNOMINATE BILAT MOD SED 08/05/2016 Luanne Bras, MD [facility]  ? IR GENERIC HISTORICAL  08/05/2016  ? IR ANGIO VERTEBRAL SEL SUBCLAVIAN INNOMINATE UNI L MOD SED 08/05/2016 Luanne Bras, MD [facility]  ? MOUTH SURGERY    ? TONSILLECTOMY    ? TUBAL LIGATION    ? ? ?Assessment / Plan / Recommendation ?Clinical Impression  Tekelia Kareem is a 76 year old right-handed female with history of hypertension, right frontal ICH 2017, breast cancer with lumpectomy with radioactive seed  localization 08/28/2020.  Per chart review patient lives with her children.  Bed bath main level of home with 4 steps to entry.  Son works third shift at home during the day.  She is a retired Pharmacist, hospital reportedly independent prior to admission.  Presented 01/10/2022 with acute onset of left-sided weakness and slid out of bed.  She attempted to stand hitting the back of her head without loss of consciousness.  Cranial CT scan showed intraparenchymal hematoma in the right parasagittal frontal lobe measuring 3.0 cm AP by 2.3 cm TV by 3.0 cm with mild surrounding edema but no midline shift.  CT cervical spine maxillofacial negative.  Initial EEG negative.  Overnight EEG with video showed seizure arising from right frontal temporal region.  Admission chemistry unremarkable except glucose 108, WBC 12,700.  Echocardiogram ejection fraction of 60 to 65% no wall motion abnormalities.  Patient has been loaded with Keppra for seizure prophylaxis.  Follow-up MRI of the brain as well as angio head and neck showed acute/subacute hematoma right pericallosal frontal lobe unchanged.  MRA negative.  Tolerating a regular diet.  Therapy evaluations completed due to patient decreased functional mobility was admitted for a comprehensive rehab program. ? ?Pt presents with severe-moderate cognitive deficits, impairments include short term recall, executive function (basic-mildly complex), emergent awareness and higher level attention. Pt demonstrated intellectual awareness of errors and occasional emergent awareness with basic verse mildly complex tasks. Pt supports memory baseline deficit from recent lyme disease and utilize written aids to support recall. Pt supports no residual deficits from 2017 CVA, also in the right  frontal lobe. Pt was completing medication/money/time management and driving. Pt scored 19/30 on SLUMS (n=>27) deficits reflect these mentioned above. Pt required total A in Cognistat construction task subsection, with  reduced error awareness. Pt supports no acute changes in swallowing nor speech. Pt would benefit from skilled ST services in order to maximize functional independence and reduce burden of care, likely requiring 24 hour supervision at discharge with continued skilled ST services. ? ?  ?Skilled Therapeutic Interventions          Skilled ST services focused on cognitive skills. SLP facilitated administration of cognitive linguistic formal assessment and provided education of results. SLP and pt collaborated to set goals for cognitive linguistic needs during length of stay. All questions answered to satisfaction.  Pt was left in room with call bell within reach and bed alarm set. SLP recommends to continue skilled services.  ?  ?SLP Assessment ? Patient will need skilled Speech Lanaguage Pathology Services during CIR admission  ?  ?Recommendations ? Patient destination: Home ?Follow up Recommendations: 24 hour supervision/assistance;Outpatient SLP ?Equipment Recommended: None recommended by SLP  ?  ?SLP Frequency 3 to 5 out of 7 days   ?SLP Duration ? ?SLP Intensity ? ?SLP Treatment/Interventions 12-14 days ? ?Minumum of 1-2 x/day, 30 to 90 minutes ? ?Cognitive remediation/compensation;Cueing hierarchy;Functional tasks;Medication managment;Patient/family education;Internal/external aids   ? ?Pain ?Pain Assessment ?Pain Scale: 0-10 ?Pain Score: 0-No pain ? ?Prior Functioning ?Cognitive/Linguistic Baseline: Baseline deficits ?Baseline deficit details: memory issues since at least June 2022 ?Type of Home: House ? Lives With: Son ?Available Help at Discharge: Family;Available PRN/intermittently ?Education: college ?Vocation: Retired ? ?SLP Evaluation ?Cognition ?Overall Cognitive Status: No family/caregiver present to determine baseline cognitive functioning ?Arousal/Alertness: Awake/alert ?Orientation Level: Oriented X4 ?Attention: Selective;Alternating ?Selective Attention: Appears intact ?Alternating Attention:  Impaired ?Alternating Attention Impairment: Verbal basic;Functional basic ?Memory: Impaired ?Memory Impairment: Retrieval deficit;Decreased recall of new information ?Awareness: Impaired ?Awareness Impairment: Emergent impairment ?Problem Solving: Impaired ?Problem Solving Impairment: Verbal complex;Functional complex ?Safety/Judgment: Impaired  ?Comprehension ?Auditory Comprehension ?Overall Auditory Comprehension: Appears within functional limits for tasks assessed ?Expression ?Expression ?Primary Mode of Expression: Verbal ?Verbal Expression ?Overall Verbal Expression: Impaired ?Initiation: No impairment ?Level of Generative/Spontaneous Verbalization: Conversation ?Naming: Impairment ?Confrontation: Within functional limits ?Convergent: 75-100% accurate (12 animals) ?Pragmatics: No impairment ?Written Expression ?Dominant Hand: Right ?Oral Motor ?Oral Motor/Sensory Function ?Overall Oral Motor/Sensory Function: Within functional limits ?Motor Speech ?Overall Motor Speech: Appears within functional limits for tasks assessed ? ?Care Tool ?Care Tool Cognition ?Ability to hear (with hearing aid or hearing appliances if normally used Ability to hear (with hearing aid or hearing appliances if normally used): 0. Adequate - no difficulty in normal conservation, social interaction, listening to TV ?  ?Expression of Ideas and Wants Expression of Ideas and Wants: 4. Without difficulty (complex and basic) - expresses complex messages without difficulty and with speech that is clear and easy to understand ?  ?Understanding Verbal and Non-Verbal Content Understanding Verbal and Non-Verbal Content: 3. Usually understands - understands most conversations, but misses some part/intent of message. Requires cues at times to understand  ?Memory/Recall Ability Memory/Recall Ability : Current season;That he or she is in a hospital/hospital unit  ?  ?Short Term Goals: ?Week 1: SLP Short Term Goal 1 (Week 1): Pt will demonstrate recall  of daily, novel information with use of internal/external aids given mod A verbal cues ?SLP Short Term Goal 2 (Week 1): Pt will demonstrate basic complex problem solving with min A verbal cues. ?SLP Short Term Goal 3 (  W

## 2022-01-19 NOTE — Patient Care Conference (Signed)
Inpatient RehabilitationTeam Conference and Plan of Care Update ?Date: 01/19/2022   Time: 12:18 PM  ? ? ?Patient Name: Cynthia [name]      ?Medical Record Number: [id number]  ?Date of Birth: 22-Dec-1945 ?Sex: Female         ?Room/Bed: 4M06C/4M06C-01 ?Payor Info: Payor: HU[employer] MEDICARE / Plan: HU[employer] MEDICARE CHOICE PPO / Product Type: *No Product type* /   ? ?Admit Date/Time:  01/19/2022  3:03 AM ? ?Primary Diagnosis:  Intracerebral hemorrhage (HCC) ? ?Hospital Problems: Principal Problem: ?  Intracerebral hemorrhage ?Active Problems: ?  ICH (intracerebral hemorrhage) ([place]) ?  Left-sided visual neglect ?  Partial symptomatic epilepsy with complex partial seizures, not intractable, without status epilepticus ([place]) ?  Mood disorder in conditions classified elsewhere ? ? ? ?Expected Discharge Date: Expected Discharge Date:  (evals pending) ? ?Team Members Present: ?Physician leading conference: Dr. Jennye Boroughs ?Social Worker Present: Ovidio Kin, LCSW ?Nurse Present: Dorien Chihuahua, RN ?PT Present: Estevan Ryder, PT ?OT Present: Willeen Cass, OT ?SLP Present: Weston Anna, SLP ?PPS Coordinator present : Gunnar Fusi, SLP ? ?   Current Status/Progress Goal Weekly Team Focus  ?Bowel/Bladder ? ?   Incontinent of bowel and bladder   continent   toileting  ?Swallow/Nutrition/ Hydration ? ?           ?ADL's ? ?           ?Mobility ? ? eval pending         ?Communication ? ? Eval Pending         ?Safety/Cognition/ Behavioral Observations ? Eval Pending         ?Pain ? ?   N/A        ?Skin ? ?   Midline      Discussion of removal  ? ? ?Discharge Planning:  ?HOme with son who does work third shift and will not be there 24/7. Will need to work on a plan if this is needed. Await therapy eval. New eval today   ?Team Discussion: ?Patient with amyloid; seizures and ICH.   ?Patient on target to meet rehab goals: ?Evals pending; requires mod assist overall for ADLs and max assist for standing with anterior bias.  ? ?*See Care  Plan and progress notes for long and short-term goals.  ? ?Revisions to Treatment Plan:  ?N/A ?  ?Teaching Needs: ?Safety, medications, transfers, toileting, etc ?  ?Current Barriers to Discharge: ?Decreased caregiver support and Home enviroment access/layout ? ?Possible Resolutions to Barriers: ?Family education ?  ? ? Medical Summary ?Current Status: intraparenchymal hematoma, HTN, Poor appetitie, Hx of R ICH in 2017 ? Barriers to Discharge: Nutrition means;Other (comments);Medical stability;Medication compliance ? Barriers to Discharge Comments: intraparenchymal hematoma, HTN, Poor appetitie, Hx of R ICH in 2017 ?Possible Resolutions to [employer]: Continue labs, enourage PO intake, follow BP ? ? ?Continued Need for Acute Rehabilitation Level of Care: The patient requires daily medical management by a physician with specialized training in physical medicine and rehabilitation for the following reasons: ?Direction of a multidisciplinary physical rehabilitation program to maximize functional independence : Yes ?Medical management of patient stability for increased activity during participation in an intensive rehabilitation regime.: Yes ?Analysis of laboratory values and/or radiology reports with any subsequent need for medication adjustment and/or medical intervention. : Yes ? ? ?I attest that I was present, lead the team conference, and concur with the assessment and plan of the team. ? ? ?Dorien Chihuahua B ?01/19/2022, 4:24 PM  ? ? ? ? ? ? ?

## 2022-01-19 NOTE — Evaluation (Signed)
Physical Therapy Assessment and Plan ? ?Patient Details  ?Name: Cynthia [name] ?MRN: [id number] ?Date of Birth: 04-Sep-1946 ? ?PT Diagnosis: Abnormal posture, Abnormality of gait, Cognitive deficits, Hemiparesis non-dominant, Muscle spasms, and Muscle weakness ?Rehab Potential: Fair ?ELOS: 2-3 weeks  ? ?Today's Date: 01/19/2022 ?PT Individual Time: 6579-0383 ?PT Individual Time Calculation (min): 55 min   ? ?Hospital Problem: Principal Problem: ?  Intracerebral hemorrhage ?Active Problems: ?  ICH (intracerebral hemorrhage) ([place]) ?  Left-sided visual neglect ?  Partial symptomatic epilepsy with complex partial seizures, not intractable, without status epilepticus ([place]) ?  Mood disorder in conditions classified elsewhere ? ? ?Past Medical History:  ?Past Medical History:  ?Diagnosis Date  ? Breast cancer ([place])   ? Colon polyps   ? Depression   ? history of  ? Diverticulitis   ? GERD (gastroesophageal reflux disease)   ? Hypertension   ? Osteoporosis   ? Seizures ([place])   ? Stroke [facility])   ? ?Past Surgical History:  ?Past Surgical History:  ?Procedure Laterality Date  ? BREAST LUMPECTOMY WITH RADIOACTIVE SEED LOCALIZATION Left 08/28/2020  ? Procedure: LEFT BREAST LUMPECTOMY WITH RADIOACTIVE SEED LOCALIZATION;  Surgeon: Stark Klein, MD;  Location: [facility];  Service: General;  Laterality: Left;  RNFA  ? IR GENERIC HISTORICAL  08/05/2016  ? IR ANGIO VERTEBRAL SEL VERTEBRAL UNI R MOD SED 08/05/2016 Luanne Bras, MD [facility]  ? IR GENERIC HISTORICAL  08/05/2016  ? IR ANGIO INTRA EXTRACRAN SEL COM CAROTID INNOMINATE BILAT MOD SED 08/05/2016 Luanne Bras, MD [facility]  ? IR GENERIC HISTORICAL  08/05/2016  ? IR ANGIO VERTEBRAL SEL SUBCLAVIAN INNOMINATE UNI L MOD SED 08/05/2016 Luanne Bras, MD [facility]  ? MOUTH SURGERY    ? TONSILLECTOMY    ? TUBAL LIGATION    ? ? ?Assessment & Plan ?Clinical Impression: Patient is a 76 y.o. year old female with history of hypertension, right frontal ICH 2017, breast  cancer with lumpectomy with radioactive seed localization 08/28/2020.  Per chart review patient lives with her children.  Bed bath main level of home with 4 steps to entry.  Son works third shift at home during the day.  She is a retired Pharmacist, hospital reportedly independent prior to admission.  Presented 01/10/2022 with acute onset of left-sided weakness and slid out of bed.  She attempted to stand hitting the back of her head without loss of consciousness.  Cranial CT scan showed intraparenchymal hematoma in the right parasagittal frontal lobe measuring 3.0 cm AP by 2.3 cm TV by 3.0 cm with mild surrounding edema but no midline shift.  CT cervical spine maxillofacial negative.  Initial EEG negative.  Overnight EEG with video showed seizure arising from right frontal temporal region.  Admission chemistry unremarkable except glucose 108, WBC 12,700.  Echocardiogram ejection fraction of 60 to 65% no wall motion abnormalities.  Patient has been loaded with Keppra for seizure prophylaxis.  Follow-up MRI of the brain as well as angio head and neck showed acute/subacute hematoma right pericallosal frontal lobe unchanged.  MRA negative.  Tolerating a regular diet.  Therapy evaluations completed due to patient decreased functional mobility was admitted for a comprehensive rehab program. ? ?Patient currently requires max with mobility secondary to muscle weakness and muscle joint tightness, decreased cardiorespiratoy endurance, decreased attention to left and decreased motor planning, decreased initiation, decreased attention, decreased awareness, decreased problem solving, decreased safety awareness, decreased memory, and delayed processing, and decreased sitting balance, decreased standing balance, decreased postural control, hemiplegia, and decreased balance  strategies.  Prior to hospitalization, patient was independent  with mobility and lived with Son in a House home.  Home access is 4 steps with rail at back entrance, no rails  on front entrance (grass to access back steps)Stairs to enter. ? ?Patient will benefit from skilled PT intervention to maximize safe functional mobility, minimize fall risk, and decrease caregiver burden for planned discharge home with 24 hour assist.  Anticipate patient will benefit from follow up [facility] at discharge. ? ?PT - End of Session ?Activity Tolerance: Tolerates 10 - 20 min activity with multiple rests ?Endurance Deficit: Yes ?Endurance Deficit Description: required rest breaks between self-care tasks ?PT Assessment ?Rehab Potential (ACUTE/IP ONLY): Fair ?PT Barriers to Discharge: Decreased caregiver support;Inaccessible home environment;Home environment access/layout;Lack of/limited family support;Other (comments) ?PT Barriers to Discharge Comments: previous CVAs ?PT Patient demonstrates impairments in the following area(s): Balance;Behavior;Endurance;Motor;Nutrition;Pain;Perception;Safety;Sensory;Skin Integrity ?PT Transfers Functional Problem(s): Bed Mobility;Bed to Chair;Car;Furniture ?PT Locomotion Functional Problem(s): Ambulation;Wheelchair Mobility;Stairs ?PT Plan ?PT Intensity: Minimum of 1-2 x/day ,45 to 90 minutes ?PT Frequency: 5 out of 7 days ?PT Duration Estimated Length of Stay: 2-3 weeks ?PT Treatment/Interventions: Ambulation/gait training;Cognitive remediation/compensation;Discharge planning;DME/adaptive equipment instruction;Functional mobility training;Pain management;Psychosocial support;Splinting/orthotics;Therapeutic Activities;UE/LE Strength taining/ROM;Visual/perceptual remediation/compensation;Balance/vestibular training;Community reintegration;Disease management/prevention;Functional electrical stimulation;Neuromuscular re-education;Patient/family education;Skin care/wound management;Stair training;Therapeutic Exercise;UE/LE Coordination activities;Wheelchair propulsion/positioning ?PT Transfers Anticipated Outcome(s): spv ?PT Locomotion Anticipated Outcome(s): CGA ?PT  Recommendation ?Follow Up Recommendations: Home health PT;24 hour supervision/assistance ?Patient destination: Home ?Equipment Recommended: To be determined ? ? ?PT Evaluation ?Precautions/Restrictions ?Precautions ?Precautions: Fall ?Precaution Comments: L LE extensor tone ?Restrictions ?Weight Bearing Restrictions: No ?Other Position/Activity Restrictions: tendency to stand on toes on L foot due to extension tone/L ankle contracture ?General ?  Vital SignsTherapy Vitals ?Temp: 98.4 ?F (36.9 ?C) ?Temp Source: Oral ?Pulse Rate: 95 ?Resp: 16 ?BP: 115/62 ?Patient Position (if appropriate): Lying ?Oxygen Therapy ?SpO2: 96 % ?O2 Device: Room Air ?Pain ?Pain Assessment ?Pain Scale: 0-10 ?Pain Score: 0-No pain ?Pain Interference ?Pain Interference ?Pain Effect on Sleep: 0. Does not apply - I have not had any pain or hurting in the past 5 days ?Pain Interference with Therapy Activities: 0. Does not apply - I have not received rehabilitationtherapy in the past 5 days ?Pain Interference with Day-to-Day Activities: 1. Rarely or not at all ?Home Living/Prior Functioning ?Home Living ?Available Help at Discharge: Family;Available PRN/intermittently ?Type of Home: House ?Home Access: Stairs to enter ?Entrance Stairs-Number of Steps: 4 steps with rail at back entrance, no rails on front entrance (grass to access back steps) ?Entrance Stairs-Rails: Right;Left;Can reach both ?Home Layout: Able to live on main level with bedroom/bathroom ?Bathroom Shower/Tub: Tub/shower unit ?Bathroom Toilet: Standard ?Bathroom Accessibility: Yes ?Additional Comments: son works 3rd shift and home during the day ? Lives With: Son ?Prior Function ?Level of Independence: Independent with basic ADLs;Independent with transfers;Independent with homemaking with ambulation;Independent with homemaking with wheelchair;Independent with gait ? Able to Take Stairs?: Reciprically ?Driving: Yes ?Vocation: Retired ?Vision/Perception  ?Vision - History ?Ability to  See in Adequate Light: 0 Adequate ?Perception ?Perception: Impaired ?Inattention/Neglect: Does not attend to left visual field;Does not attend to left side of body ?Praxis ?Praxis: Impaired ?Praxis Impairment [place]

## 2022-01-19 NOTE — Progress Notes (Signed)
Inpatient Rehabilitation  Patient information reviewed and entered into eRehab system by Anysa Tacey M. Ananth Fiallos, M.A., CCC/SLP, PPS Coordinator.  Information including medical coding, functional ability and quality indicators will be reviewed and updated through discharge.    

## 2022-01-19 NOTE — Plan of Care (Signed)
?  Problem: Consults ?Goal: RH BRAIN INJURY PATIENT EDUCATION ?Description: Description: See Patient Education module for eduction specifics ?Outcome: Progressing ?  ?Problem: RH BOWEL ELIMINATION ?Goal: RH STG MANAGE BOWEL WITH ASSISTANCE ?Description: STG Manage Bowel with mod I  Assistance. ?Outcome: Progressing ?Goal: RH STG MANAGE BOWEL W/MEDICATION W/ASSISTANCE ?Description: STG Manage Bowel with Medication with mod I Assistance. ?Outcome: Progressing ?  ?Problem: RH BLADDER ELIMINATION ?Goal: RH STG MANAGE BLADDER WITH ASSISTANCE ?Description: STG Manage Bladder With  toileting Assistance ?Outcome: Progressing ?  ?Problem: RH SAFETY ?Goal: RH STG ADHERE TO SAFETY PRECAUTIONS W/ASSISTANCE/DEVICE ?Description: STG Adhere to Safety Precautions With cues Assistance/Device. ?Outcome: Progressing ?  ?Problem: RH PAIN MANAGEMENT ?Goal: RH STG PAIN MANAGED AT OR BELOW PT'S PAIN GOAL ?Description: At or below level 4 with prns ?Outcome: Progressing ?  ?Problem: RH KNOWLEDGE DEFICIT BRAIN INJURY ?Goal: RH STG INCREASE KNOWLEDGE OF SELF CARE AFTER BRAIN INJURY ?Description: Patient and son will be able to manage care at discharge using handouts and educational tools independently ?Outcome: Progressing ?  ?Problem: RH KNOWLEDGE DEFICIT ?Goal: RH STG INCREASE KNOWLEDGE OF HYPERTENSION ?Description: Patient and son will be able to manage HTN with medications and dietary modifications using handouts and educational tools independently ?Outcome: Progressing ?  ?

## 2022-01-19 NOTE — Progress Notes (Signed)
Inpatient Rehabilitation Care Coordinator ?Assessment and Plan ?Patient Details  ?Name: Cynthia [name] ?MRN: [id number] ?Date of Birth: 1946/05/06 ? ?Today's Date: 01/19/2022 ? ?Hospital Problems: Principal Problem: ?  Intracerebral hemorrhage ?Active Problems: ?  ICH (intracerebral hemorrhage) ([place]) ?  Left-sided visual neglect ?  Partial symptomatic epilepsy with complex partial seizures, not intractable, without status epilepticus ([place]) ? ?Past Medical History:  ?Past Medical History:  ?Diagnosis Date  ? Breast cancer ([place])   ? Colon polyps   ? Depression   ? history of  ? Diverticulitis   ? GERD (gastroesophageal reflux disease)   ? Hypertension   ? Osteoporosis   ? Seizures ([place])   ? Stroke [facility])   ? ?Past Surgical History:  ?Past Surgical History:  ?Procedure Laterality Date  ? BREAST LUMPECTOMY WITH RADIOACTIVE SEED LOCALIZATION Left 08/28/2020  ? Procedure: LEFT BREAST LUMPECTOMY WITH RADIOACTIVE SEED LOCALIZATION;  Surgeon: Stark Klein, MD;  Location: [facility];  Service: General;  Laterality: Left;  RNFA  ? IR GENERIC HISTORICAL  08/05/2016  ? IR ANGIO VERTEBRAL SEL VERTEBRAL UNI R MOD SED 08/05/2016 Luanne Bras, MD [facility]  ? IR GENERIC HISTORICAL  08/05/2016  ? IR ANGIO INTRA EXTRACRAN SEL COM CAROTID INNOMINATE BILAT MOD SED 08/05/2016 Luanne Bras, MD [facility]  ? IR GENERIC HISTORICAL  08/05/2016  ? IR ANGIO VERTEBRAL SEL SUBCLAVIAN INNOMINATE UNI L MOD SED 08/05/2016 Luanne Bras, MD [facility]  ? MOUTH SURGERY    ? TONSILLECTOMY    ? TUBAL LIGATION    ? ?Social History:  reports that she has never smoked. She has never been exposed to tobacco smoke. She has never used smokeless tobacco. She reports current alcohol use of about 1.0 standard drink per week. She reports that she does not currently use drugs. ? ?Family / Support Systems ?Marital Status: Widow/Widower ?How Long?: 09/2021 ?Patient Roles: Parent, Other (Comment) (sibling) ?Children: Sunny Schlein [phone] he lives  with pt  Marita Snellen [phone] lives in [place] ?Other Supports: Sister in [place] but dealing with husband who recently had a CVA and is currently in the hospital ?Anticipated Caregiver: Jonni Sanger ?Ability/Limitations of Caregiver: works third shift and sleeps some during the day ?Caregiver Availability: Other (Comment) (Will need to come up with a plan if 24/7 care is needed) ?Family Dynamics: Close with son's and sister, all are involved and will do what they can for her. Her son in [place] has had another baby and facetimed her from the hospital ? ?Social History ?Preferred language: English ?Religion:  ?Cultural Background: No issues ?Education: College ?Health Literacy - How often do you need to have someone help you when you read instructions, pamphlets, or other written material from your doctor or pharmacy?: Never ?Writes: Yes ?Employment Status: Retired ?Legal History/Current Legal Issues: No issues ?Guardian/Conservator: None-according to MD pt is capable of making her own decisions while here.  ? ?Abuse/Neglect ?Abuse/Neglect Assessment Can Be Completed: Yes ?Physical Abuse: Denies ?Verbal Abuse: Denies ?Sexual Abuse: Denies ?Exploitation of patient/patient's resources: Denies ?Self-Neglect: Denies ? ?Patient response to: ?Social Isolation - How often do you feel lonely or isolated from those around you?: Rarely ? ?Emotional Status ?Pt's affect, behavior and adjustment status: Pt is motivated and wants to se eif regain her independence while here. She has always been a caregiver of others and late husband. She hopes to be able to take care of herself when she leaves here ?Recent Psychosocial Issues: other health issues-past hx of CVA ?Psychiatric History: History of depression takes medications for this and  feels helps. May benefit from seeing neuro-psych while here. Will place on list ?Substance Abuse History: No issues ? ?Patient / Family Perceptions, Expectations & Goals ?Pt/Family understanding of illness &  functional limitations: Pt can explain her stroke and deficits, she does talk with the MD who rounds and feels her questions and concerns are being addressed. She looks forward to therapy today ?Premorbid pt/family roles/activities: mom, grandmother, sister, retiree, church member, etc ?Anticipated changes in roles/activities/participation: resume ?Pt/family expectations/goals: Pt states: " I want to do well and get back on my feet again." ? ?Community Resources ?Community Agencies: None ?Premorbid Home Care/DME Agencies: None ?Transportation available at discharge: son ?Is the patient able to respond to transportation needs?: Yes ?In the past 12 months, has lack of transportation kept you from medical appointments or from getting medications?: No ?In the past 12 months, has lack of transportation kept you from meetings, work, or from getting things needed for daily living?: No ?Resource referrals recommended: Neuropsychology ? ?Discharge Planning ?Living Arrangements: Children ?Support Systems: Children, Other relatives, Church/faith community ?Type of Residence: Private residence ?Insurance Resources: Multimedia programmer (specify) ([employer]) ?Financial Resources: Fish farm manager, Family Support ?Financial Screen Referred: No ?Living Expenses: Own ?Money Management: Patient ?Does the patient have any problems obtaining your medications?: No ?Home Management: Patient son may assist now ?Patient/Family Preliminary Plans: Return home with son who does work third shift and sleeps some during the day. Will see how pt does and go from there. Her sister would usually help her but is currenlty with her husband in the hospital. Will work on best plan for pt ?Care Coordinator Barriers to Discharge: Lack of/limited family support, Insurance for SNF coverage ?Care Coordinator Anticipated Follow Up Needs: HH/OP ? ?Clinical Impression ?Pleasant female who is willing to work hard in therapies to recover and regain her  independence. Her son can assist some but not provide 24/7 care due to he works. Her sister is not sure she can assist with her husband's health issues. Will await therapy team evaluations. ? ?Elease Hashimoto ?01/19/2022, 10:54 AM ? ?  ?

## 2022-01-19 NOTE — Progress Notes (Signed)
Patient transfer to 4MW via bed and in stable condition. Alert and oriented at the time of discharge. ?

## 2022-01-20 DIAGNOSIS — I69114 Frontal lobe and executive function deficit following nontraumatic intracerebral hemorrhage: Secondary | ICD-10-CM

## 2022-01-20 DIAGNOSIS — I611 Nontraumatic intracerebral hemorrhage in hemisphere, cortical: Secondary | ICD-10-CM | POA: Diagnosis not present

## 2022-01-20 DIAGNOSIS — R630 Anorexia: Secondary | ICD-10-CM

## 2022-01-20 DIAGNOSIS — F0630 Mood disorder due to known physiological condition, unspecified: Secondary | ICD-10-CM | POA: Diagnosis not present

## 2022-01-20 NOTE — Progress Notes (Incomplete)
Occupational Therapy TBI Note ? ?Patient Details  ?Name: Cynthia [name] ?MRN: [id number] ?Date of Birth: October 18, 1945 ? ?{CHL IP REHAB OT TIME CALCULATIONS:304400400} ? ? ?Short Term Goals: ?{OT QMG:5003704} ? ?Skilled Therapeutic Interventions/Progress Updates:  ?   ? ?Therapy Documentation ?Precautions:  ?Precautions ?Precautions: Fall ?Precaution Comments: L LE extensor tone ?Restrictions ?Weight Bearing Restrictions: No ?Other Position/Activity Restrictions: tendency to stand on toes on L foot due to extension tone/L ankle contracture ?General: ?  ?Vital Signs: ?Therapy Vitals ?Temp: 98.3 ?F (36.8 ?C) ?Pulse Rate: 94 ?Resp: 15 ?BP: (!) 143/74 ?Patient Position (if appropriate): Lying ?Oxygen Therapy ?SpO2: 96 % ?O2 Device: Room Air ?Pain: ?  ?Agitated Behavior Scale: ?TBI ?  ?  ? ?ADL: ?ADL ?Eating: Set up ?Where Assessed-Eating: Bed level ?Grooming: Supervision/safety ?Where Assessed-Grooming: Edge of bed ?Upper Body Bathing: Supervision/safety ?Where Assessed-Upper Body Bathing: Edge of bed ?Lower Body Bathing: Moderate assistance ?Where Assessed-Lower Body Bathing: Bed level ?Upper Body Dressing: Minimal assistance ?Where Assessed-Upper Body Dressing: Edge of bed ?Lower Body Dressing: Maximal assistance ?Where Assessed-Lower Body Dressing: Bed level ?Toileting: Maximal assistance ?Where Assessed-Toileting: Bed level ?Toilet Transfer: Dependent ?Toilet Transfer Method: Other (comment) (stedy) ?Toilet Transfer Equipment: Bedside commode ?Tub/Shower Transfer: Unable to assess ?Tub/Shower Transfer Method: Unable to assess ?Walk-In Shower Transfer: Unable to assess ?Walk-In Shower Transfer Method: Unable to assess ?Vision ?  ?Perception  ?  ?Praxis ?  ?Exercises: ?  ?Other Treatments:   ? ? ?Therapy/Group: {Therapy/Group:3049007} ? ?Leroy Libman ?01/20/2022, 6:17 AM ?

## 2022-01-20 NOTE — Progress Notes (Signed)
Physical Therapy TBI Note ? ?Patient Details  ?Name: Cynthia [name] ?MRN: [id number] ?Date of Birth: 11/04/1945 ? ?Today's Date: 01/20/2022 ?PT Individual Time: 1000-1045 ?PT Individual Time Calculation (min): 45 min  ? ?Short Term Goals: ?Week 1:  PT Short Term Goal 1 (Week 1): Patient will complete supine <> sit with MinA consistently ?PT Short Term Goal 2 (Week 1): Patient will complete sit <> stand with LRAD and ModA consistently ?PT Short Term Goal 3 (Week 1): Patient will ambulate at least 53f with LRAD and ModA + wc follow as needed ?PT Short Term Goal 4 (Week 1): Patient will initiate stair training ? ?Skilled Therapeutic Interventions/Progress Updates:  ?   ?Pt presenting sitting in w/c - agreeable to PT tx. Denies pain. Transported to ortho rehab gym for time and assisted to mat table with minA stand<>pivot transfer without AD. Unsupported sitting balance with supervision - sitting posture slightly kyphotic with dipping of L shoulder, rotated to her L. Provided mirror to work on postural awareness/control and to increase awareness of deficits. Sit<>stand from mat table with minA with PT blocking LLE to prevent adduction - significant PF tone in L resulting in absent heel contact in standing, all on her toes. Provided medium inclined wedge to work stretching for tone management while standing - standing tolerance 3-5 minutes per stand while stretching. Required monitoring of her L foot due to some moderate inversion from her tone. With back of arm chair for RUE support, she's able to stand with CGA. Able to progress to side stepping L<>R along mat table but she struggled with apraxia during this task - modA requiring for facilitation of stepping and pt fearful of falling as well. Initiated gait training with PT on stool in front of her, both her UE 's on therapist's shoulder for support. Ambulated ~773fwith minA (fading to modA for last ~1074fon level surfaces. Assist primarily required for trunk support  due to L lean - she's able to swing LLE through gait cycle without physical assist - cues for increasing step length and heel strike on L with good response. Transported remaining distance to her room - remained seated in w/c with safety belt alarm on, call bell in lap, all needs met.  ? ?Therapy Documentation ?Precautions:  ?Precautions ?Precautions: Fall ?Precaution Comments: L LE extensor tone ?Restrictions ?Weight Bearing Restrictions: No ?Other Position/Activity Restrictions: tendency to stand on toes on L foot due to extension tone/L ankle contracture ?General: ?  ?  ?Agitated Behavior Scale: ?TBI ?Observation Details ?Observation Environment: CIR ?Start of observation period - Date: 01/20/22 ?Start of observation period - Time: 1000 ?End of observation period - Date: 01/20/22 ?End of observation period - Time: 1045 ?Agitated Behavior Scale (DO NOT LEAVE BLANKS) ?Short attention span, easy distractibility, inability to concentrate: Present to a slight degree ?Impulsive, impatient, low tolerance for pain or frustration: Absent ?Uncooperative, resistant to care, demanding: Absent ?Violent and/or threatening violence toward people or property: Absent ?Explosive and/or unpredictable anger: Absent ?Rocking, rubbing, moaning, or other self-stimulating behavior: Absent ?Pulling at tubes, restraints, etc.: Absent ?Wandering from treatment areas: Absent ?Restlessness, pacing, excessive movement: Absent ?Repetitive behaviors, motor, and/or verbal: Absent ?Rapid, loud, or excessive talking: Absent ?Sudden changes of mood: Absent ?Easily initiated or excessive crying and/or laughter: Absent ?Self-abusiveness, physical and/or verbal: Absent ?Agitated behavior scale total score: 15 ? ?Therapy/Group: Individual Therapy ? ?Sherlene Rickel P Milina Pagett ?01/20/2022, 11:23 AM  ?

## 2022-01-20 NOTE — Plan of Care (Signed)
?  Problem: Consults ?Goal: RH BRAIN INJURY PATIENT EDUCATION ?Description: Description: See Patient Education module for eduction specifics ?Outcome: Progressing ?  ?Problem: RH BOWEL ELIMINATION ?Goal: RH STG MANAGE BOWEL WITH ASSISTANCE ?Description: STG Manage Bowel with mod I  Assistance. ?Outcome: Progressing ?Goal: RH STG MANAGE BOWEL W/MEDICATION W/ASSISTANCE ?Description: STG Manage Bowel with Medication with mod I Assistance. ?Outcome: Progressing ?  ?Problem: RH BLADDER ELIMINATION ?Goal: RH STG MANAGE BLADDER WITH ASSISTANCE ?Description: STG Manage Bladder With  toileting Assistance ?Outcome: Progressing ?  ?Problem: RH SAFETY ?Goal: RH STG ADHERE TO SAFETY PRECAUTIONS W/ASSISTANCE/DEVICE ?Description: STG Adhere to Safety Precautions With cues Assistance/Device. ?Outcome: Progressing ?  ?Problem: RH PAIN MANAGEMENT ?Goal: RH STG PAIN MANAGED AT OR BELOW PT'S PAIN GOAL ?Description: At or below level 4 with prns ?Outcome: Progressing ?  ?Problem: RH KNOWLEDGE DEFICIT BRAIN INJURY ?Goal: RH STG INCREASE KNOWLEDGE OF SELF CARE AFTER BRAIN INJURY ?Description: Patient and son will be able to manage care at discharge using handouts and educational tools independently ?Outcome: Progressing ?  ?Problem: RH KNOWLEDGE DEFICIT ?Goal: RH STG INCREASE KNOWLEDGE OF HYPERTENSION ?Description: Patient and son will be able to manage HTN with medications and dietary modifications using handouts and educational tools independently ?Outcome: Progressing ?  ?Problem: RH Vision ?Goal: RH LTG Vision (Specify) ?Outcome: Progressing ?  ?

## 2022-01-20 NOTE — Progress Notes (Addendum)
Speech Language Pathology TBI Note ? ?Patient Details  ?Name: Cynthia [name] ?MRN: [id number] ?Date of Birth: 08-Mar-1946 ? ?Today's Date: 01/20/2022 ?SLP Individual Time: 1400-1500 - 60 minutes ?  ? ?Short Term Goals: ?Week 1: SLP Short Term Goal 1 (Week 1): Pt will demonstrate recall of daily, novel information with use of internal/external aids given mod A verbal cues. - Required Max A to recall bills that she currently pays. Unable to state which pills are autodraft, and which pills need to be called in. ? ?SLP Short Term Goal 2 (Week 1): Pt will demonstrate basic complex problem solving with min A verbal cues. - Did not formally address this session. ? ?SLP Short Term Goal 3 (Week 1): Pt will self-monitor and self-correct errors in functional task with mod A verbal cues. - Pt participated in therapeutic tasks targeting error awareness by identifying medication placement errors on mock pill organizer worksheet; required Max A to complete with 100% accuracy. Attempted to fade cues without success ? ?SLP Short Term Goal 4 (Week 1): Pt will demonstrate alternating attention within 10 minute intervals with mod A verbal cues. - Attended to visual alternating attention task for 10 minutes with Max A.  ? ?Skilled Therapeutic Interventions: ?Pt seen this date for skilled ST intervention targeting cognitive-linguistic goals outlined above. Pt encountered awake/alert and OOB in w/c. Agreeable to ST intervention. ? ?SLP facilitated today's session by providing the following skilled interventions within the context of functional and therapeutic tx tasks: education re: use of external aids (calendar, whiteboard, memory notebook) to aid in recall of daily events, Max to Total A for error awareness, organization, and recall, verbal cues for motor initiation, corrective feedback, and multimodal cueing to include visual/written and verbal cues. Please see above for objective data re: pt's performance during today's session. Pt  remains stimulable for skilled ST intervention and will continue to benefit from such to maximize her independence + self-advocacy; therefore, decreasing caregiver burden. ? ?Pt left in w/c with safety belt donned, call bell reviewed and within reach, and all immediate needs met. Continue per current ST POC. ? ?Pain ?No pain reported; NAD ? ?Agitated Behavior Scale: ?TBI ?Observation Details ?Observation Environment: CIR ?Start of observation period - Date: 01/20/22 ?Start of observation period - Time: 1400 ?End of observation period - Date: 01/20/22 ?End of observation period - Time: 1500 ?Agitated Behavior Scale (DO NOT LEAVE BLANKS) ?Short attention span, easy distractibility, inability to concentrate: Present to a moderate degree ?Impulsive, impatient, low tolerance for pain or frustration: Absent ?Uncooperative, resistant to care, demanding: Absent ?Violent and/or threatening violence toward people or property: Absent ?Explosive and/or unpredictable anger: Absent ?Rocking, rubbing, moaning, or other self-stimulating behavior: Absent ?Pulling at tubes, restraints, etc.: Absent ?Wandering from treatment areas: Absent ?Restlessness, pacing, excessive movement: Absent ?Repetitive behaviors, motor, and/or verbal: Absent ?Rapid, loud, or excessive talking: Absent ?Sudden changes of mood: Absent ?Easily initiated or excessive crying and/or laughter: Absent ?Self-abusiveness, physical and/or verbal: Absent ?Agitated behavior scale total score: 16 ? ?Therapy/Group: Individual Therapy ? ?Aubrea Meixner A Janeen Watson ?01/20/2022, 9:04 PM ?

## 2022-01-20 NOTE — Progress Notes (Signed)
?                                                       PROGRESS NOTE ? ? ?Subjective/Complaints: ?Pt reports no new concerns this AM. Reports she slept well. Eating well this AM.  ? ?Review of Systems  ?Constitutional:  Negative for chills and fever.  ?Respiratory:  Negative for cough and shortness of breath.   ?Cardiovascular:  Negative for chest pain.  ?Gastrointestinal:  Negative for abdominal pain, constipation, diarrhea, nausea and vomiting.  ?Musculoskeletal:  Negative for back pain and joint pain.  ?Neurological:  Negative for dizziness and headaches.   ? ?Objective: ?  ?No results found. ?Recent Labs  ?  01/19/22 ?3220  ?WBC 6.5  ?HGB 12.6  ?HCT 36.1  ?PLT 298  ? ? ?Recent Labs  ?  01/19/22 ?2542  ?NA 139  ?K 3.6  ?CL 105  ?CO2 28  ?GLUCOSE 104*  ?BUN 19  ?CREATININE 0.68  ?CALCIUM 9.0  ? ? ? ?Intake/Output Summary (Last 24 hours) at 01/20/2022 0723 ?Last data filed at 01/19/2022 0730 ?Gross per 24 hour  ?Intake 240 ml  ?Output --  ?Net 240 ml  ? ?  ? ?  ? ?Physical Exam: ?Vital Signs ?Blood pressure (!) 143/74, pulse 94, temperature 98.3 ?F (36.8 ?C), resp. rate 15, height '5\' 6"'$  (1.676 m), weight 61.4 kg, SpO2 96 %. ? ? ? ?Assessment/Plan: ?1. Functional deficits which require 3+ hours per day of interdisciplinary therapy in a comprehensive inpatient rehab setting. ?Physiatrist is providing close team supervision and 24 hour management of active medical problems listed below. ?Physiatrist and rehab team continue to assess barriers to discharge/monitor patient progress toward functional and medical goals ? ?Care Tool: ? ?Bathing ?   ?Body parts bathed by patient: Right arm, Left arm, Chest, Abdomen, Front perineal area, Right upper leg, Left upper leg, Face  ? Body parts bathed by helper: Right lower leg, Left lower leg, Buttocks ?  ?  ?Bathing assist Assist Level: Moderate Assistance - Patient 50 - 74% ?  ?  ?Upper Body Dressing/Undressing ?Upper body dressing   ?What is the patient wearing?: Pull over  shirt ?   ?Upper body assist Assist Level: Minimal Assistance - Patient > 75% ?   ?Lower Body Dressing/Undressing ?Lower body dressing ? ? ?   ?What is the patient wearing?: Incontinence brief, Pants ? ?  ? ?Lower body assist Assist for lower body dressing: Total Assistance - Patient < 25% (bed level) ?   ? ?Toileting ?Toileting    ?Toileting assist Assist for toileting: Maximal Assistance - Patient 25 - 49% ?  ?  ?Transfers ?Chair/bed transfer ? ?Transfers assist ?   ? ?Chair/bed transfer assist level: Maximal Assistance - Patient 25 - 49% ?  ?  ?Locomotion ?Ambulation ? ? ?Ambulation assist ? ?   ? ?Assist level: 2 helpers ?Assistive device: Hand held assist ?Max distance: 5  ? ?Walk 10 feet activity ? ? ?Assist ? Walk 10 feet activity did not occur: Safety/medical concerns ? ?  ?   ? ?Walk 50 feet activity ? ? ?Assist Walk 50 feet with 2 turns activity did not occur: Safety/medical concerns ? ?  ?   ? ? ?Walk 150 feet activity ? ? ?Assist Walk 150 feet activity did not occur: Safety/medical concerns ? ?  ?  ?  ? ?  Walk 10 feet on uneven surface  ?activity ? ? ?Assist Walk 10 feet on uneven surfaces activity did not occur: Safety/medical concerns ? ? ?  ?   ? ?Wheelchair ? ? ? ? ?Assist Is the patient using a wheelchair?: Yes ?Type of Wheelchair: Manual ?  ? ?Wheelchair assist level: Total Assistance - Patient < 25% ?Max wheelchair distance: 150  ? ? ?Wheelchair 50 feet with 2 turns activity ? ? ? ?Assist ? ?  ?  ? ? ?Assist Level: Total Assistance - Patient < 25%  ? ?Wheelchair 150 feet activity  ? ? ? ?Assist ?   ? ? ?Assist Level: Total Assistance - Patient < 25%  ? ?Blood pressure (!) 143/74, pulse 94, temperature 98.3 ?F (36.8 ?C), resp. rate 15, height '5\' 6"'$  (1.676 m), weight 61.4 kg, SpO2 96 %. ? ? ? ?General: Alert, No apparent distress. Sitting in bed ?HEENT: Head is normocephalic,  PERRLA, EOMI, sclera anicteric, oral mucosa pink and moist, bruising noted on face ?Neck: Supple without JVD or  lymphadenopathy ?Heart: Reg rate and rhythm. No murmurs rubs or gallops ?Chest: CTA bilaterally without wheezes, rales, or rhonchi; no distress ?Abdomen: Soft, non-tender, non-distended, bowel sounds positive. ?Extremities: No clubbing, cyanosis, or edema. Pulses are 2+ ?Psych: Pt's affect is appropriate. Pt is cooperative ?Skin: Clean and intact without signs of breakdown ?Neuro:  Alert. Some neglect of left side ?CN 2-12 intact other than possibly decreased shrug on Left ?Musculoskeletal: 5/5 RUE and RLE ?3-4-/5 in LUE ?L HF 4-/5, Knee extension 4/5, Ankle FP/DF 3/5 ?RUE bruising  ?Midline RUE ?No ataxia or dysmetria noted ? ?Medical Problem List and Plan: ?1. Functional deficits secondary to intraparenchymal hematoma ?            -patient may  shower ?            -ELOS/Goals: 12-14 days supervision ?            -Continue CIR for PT, OT and SLP ?2.  Antithrombotics: ?-DVT/anticoagulation:  Mechanical: Antiembolism stockings, thigh (TED hose) Bilateral lower extremities ?            No Lovenoxetc due to IPH ?            -antiplatelet therapy: N/A ?3. Pain Management: ?As needed ?4. Mood: Effexor 75 mg daily ?            -antipsychotic agents: N/A ? -5/11 Good mood today, continue effexor '75mg'$  ?5. Neuropsych: This patient is capable of making decisions on her own behalf. ?6. Skin/Wound Care: Routine skin check ?7. Fluids/Electrolytes/Nutrition: Routine in and out with follow-up chemistries ? -5/11 midline removed yesterday ?8.  Seizure disorder- new onset since IPH-.  Keppra 1000 mg every 12 hours ?9.  Hypertension.  Cozaar 100 mg daily.  Monitor with increased mobility ? -5/10 BP appears well controlled this AM, continue cozaar ?10.  History of left breast cancer with radioactive seed localization 08/28/2020.  Follow-up outpatient ?11. Hx of R frontal ICH 2017 ?12. Poor appetite- might need supplements or appetite stimulants ? -5/10 albumin a little low, encourage PO intake ? -5/11 ate all her breakfast. Continue  to encourage eating ? ?LOS: ?1 days ?A FACE TO FACE EVALUATION WAS PERFORMED ? ?Jennye Boroughs ?01/20/2022, 7:23 AM  ? ?  ?

## 2022-01-20 NOTE — Progress Notes (Addendum)
Occupational Therapy Session Note ? ?Patient Details  ?Name: Cynthia [name] ?MRN: [id number] ?Date of Birth: Jul 04, 1946 ? ?Today's Date: 01/21/2022 ?OT Individual Time:1100-1200 ?60 mins total ?   ? ? ?Short Term Goals: ?Week 1:  OT Short Term Goal 1 (Week 1): Pt will complete toilet transfer with max A of 1 to [facility] to promote OOB toileting ?OT Short Term Goal 2 (Week 1): Pt will complete LB dressing mod A at the sit > stand level with AE PRN ?OT Short Term Goal 3 (Week 1): Pt will maintain dynamic sitting balance with supervision during ADL task ? ?Skilled Therapeutic Interventions/Progress Updates:  ?  Pt resting in w/c upon arrival. LUE wedged between leg and arm rest. Half lap tray provided. OT intervention with focus on BUE use L>R for functional tasks with clothes pins. Emphasis on sitting balance and reaching across midline while maintaining balance. Pt with persistent lean to Lt when sitting but can correct with verbal cues. Pt required max verbal cues for attention to task. Pt frequently "paused" while completing tasks and required verbal cue to continue. Pt remained in w/c with belt alarm activated. Half lap tray in place. All needs within reach.  ? ?Therapy Documentation ?Precautions:  ?Precautions ?Precautions: Fall ?Precaution Comments: L LE extensor tone ?Restrictions ?Weight Bearing Restrictions: No ?Other Position/Activity Restrictions: tendency to stand on toes on L foot due to extension tone/L ankle contracture ?Pain: ? Pt denies pain this morning ? ? ?Therapy/Group: Individual Therapy ? ?Leroy Libman ?01/21/2022, 7:49 AM ?

## 2022-01-20 NOTE — Progress Notes (Signed)
Physical Therapy TBI Note ? ?Patient Details  ?Name: Cynthia [name] ?MRN: [id number] ?Date of Birth: 1946/06/18 ? ?Today's Date: 01/20/2022 ?PT Individual Time: 8466-5993 ?PT Individual Time Calculation (min): 73 min  ? ?Short Term Goals: ?Week 1:  PT Short Term Goal 1 (Week 1): Patient will complete supine <> sit with MinA consistently ?PT Short Term Goal 2 (Week 1): Patient will complete sit <> stand with LRAD and ModA consistently ?PT Short Term Goal 3 (Week 1): Patient will ambulate at least 43f with LRAD and ModA + wc follow as needed ?PT Short Term Goal 4 (Week 1): Patient will initiate stair training ? ?Skilled Therapeutic Interventions/Progress Updates:  ?  Pt received supine in bed and agreeable to therapy session. Pt sitting near R EOB with pt reporting she was attempting to see if she could get OOB to the chair - therapist reinforced education that she needs assistance for any attempts at mobility. Pt unaware but she was incontinent of bowels. ? ?Rolling L in bed using bedrails with min assist and rolling R with mod/max assist - benefits from cuing with a target to move L hemibody towards due to significant apraxia during dependent assist LB clothing management and peri-care for cleanliness requiring additional time. ? ?Supine>sitting R EOB, HOB flat but using bedrail, with mod assist for trunk upright and then max assist for rotating L hip towards EOB - pt able to initiate L hemibody movements but difficulty scooting L hip, only scooting R hip. While sitting EOB pt would have L posterior trunk LOB requiring min assist to recover. Donned bra, shirt, and pants requiring max assist to initiate donning L hemibody motor planning and then to complete the task. Donned socks and shoes total assist for time management. ? ?Pt does continue to have significant tone in L plantar flexors but with prolonged stretch in sitting able to get ankle almost to 90 degrees of dorsiflexion with knee flexed to allow  WBing. ? ?Sit>stand EOB to no UE support with heavy mod assist for balance and rising to stand due to strong LEFT posterior lean, pulled pants up over hips max assist. ? ?R squat pivot transfer EOB>w/c with heavy mod/light max assist for lifting and rotating hips ? ? Transported to/from gym in w/c for time management and energy conservation. ? ?Sit<>stands to/from w/c using R UE support on hallway rail with mirror feedback - heavy mod assist for lifting to stand due to continued lack of sufficient anterior trunk lean. In standing, performed L LE WBing ankle stretch with therapist providing manual facilitation to rotate L hip forward to improve femur and tibia alignment to improve ankle positioning for stretch into DF. ? ?Gait training at R hallway rail with L UE support around therapist's shoulders 315fx2 (initially having w/c follow progressed to placing seat on opposite end of rail) with light mod assist of 1 for balance and L LE management ?- able to advance L LE with assist only to avoid scissoring, attempted to use visual target on floor but this resulted in too much trunk flexion ?- stabilizes L knee during stance without assist but therapist continuing to provide facilitation for rotating pelvis towards R causing L hip to come forward to improve ankle alignment ?*noticed without L UE up around therapist, pt will have significant L lateral trunk flexion/lean ? ? ?Transported back to room in w/c and pt agreeable to remain sitting up - left with needs in reach and seat belt alarm on. ? ?Therapy Documentation ?Precautions:  ?  Precautions ?Precautions: Fall ?Precaution Comments: L LE extensor tone ?Restrictions ?Weight Bearing Restrictions: No ?Other Position/Activity Restrictions: tendency to stand on toes on L foot due to extension tone/L ankle contracture ? ? ? ?Pain: ?  No reports of pain throughout session. ? ?Agitated Behavior Scale: ?TBI  ?Observation Details ?Observation Environment: CIR ?Start of  observation period - Date: 01/20/22 ?Start of observation period - Time: 0805 ?End of observation period - Date: 01/20/22 ?End of observation period - Time: 0918 ?Agitated Behavior Scale (DO NOT LEAVE BLANKS) ?Short attention span, easy distractibility, inability to concentrate: Present to a slight degree ?Impulsive, impatient, low tolerance for pain or frustration: Absent ?Uncooperative, resistant to care, demanding: Absent ?Violent and/or threatening violence toward people or property: Absent ?Explosive and/or unpredictable anger: Absent ?Rocking, rubbing, moaning, or other self-stimulating behavior: Absent ?Pulling at tubes, restraints, etc.: Absent ?Wandering from treatment areas: Absent ?Restlessness, pacing, excessive movement: Absent ?Repetitive behaviors, motor, and/or verbal: Absent ?Rapid, loud, or excessive talking: Absent ?Sudden changes of mood: Absent ?Easily initiated or excessive crying and/or laughter: Absent ?Self-abusiveness, physical and/or verbal: Absent ?Agitated behavior scale total score: 15 ? ? ? ? ?Therapy/Group: Individual Therapy ? ?Tawana Scale , PT, DPT, NCS, CSRS ?01/20/2022, 7:50 AM  ?

## 2022-01-21 DIAGNOSIS — I10 Essential (primary) hypertension: Secondary | ICD-10-CM | POA: Diagnosis not present

## 2022-01-21 DIAGNOSIS — R630 Anorexia: Secondary | ICD-10-CM

## 2022-01-21 DIAGNOSIS — F0630 Mood disorder due to known physiological condition, unspecified: Secondary | ICD-10-CM | POA: Diagnosis not present

## 2022-01-21 DIAGNOSIS — I611 Nontraumatic intracerebral hemorrhage in hemisphere, cortical: Secondary | ICD-10-CM | POA: Diagnosis not present

## 2022-01-21 NOTE — Progress Notes (Signed)
PMR Admission Coordinator Pre-Admission Assessment ?  ?Patient: Cynthia [name] is an 76 y.o., female ?MRN: [id number] ?DOB: 07-07-1946 ?Height: _0  (167.6 cm) ?Weight: 59.6 kg ?  ?Insurance Information ?HMO:     PPO: yes     PCP:      IPA:      80/20:      OTHER:  ?PRIMARY: [employer]       Policy#: [id number]      Subscriber: pt ?CM Name: Cassie     Phone#:  [phone]   Fax#: [phone]  Received approval from [place] at [facility] 5/825 with auth good for 5 days. Clinical updates due 01/25/22 ?Pre-Cert#: [id number]      Employer:  ?Benefits:  Phone #:      Name:  ?Irene Shipper Date: 09/13/2019- still active  ?Deductible: does not have  ?OOP Max: $4,000 ($100 met)  ?CIR: $160/day co-pay with a max co-pay of $1,600/admission (10 days)  ?SNF: 100% coverage  ?Outpatient:  $20/visit co-pay  ?Home Health:  100% coverage  ?DME: 80% coverage; 20% co-insurance  ?Providers: in network  ?SECONDARY: none       Policy#:      Phone#:  ?  ?Financial Counselor:       Phone#:  ?  ?The ?Data Collection Information Summary? for patients in Inpatient Rehabilitation Facilities with attached ?Privacy Act [place] Records? was provided and verbally reviewed with: Patient ?  ?Emergency Contact Information ?Contact Information   ?  ?  Name Relation Home Work Mobile  ?  Carson,Matthew Son     [phone]  ?  Ferol Luz Son     [phone]  ?  ?   ?  ?  ?Current Medical History  ?Patient Admitting Diagnosis: Seizure, ICH ?History of Present Illness: Cynthia [name] is a 76 year old right-handed female with history of hypertension, right frontal ICH 2017, breast cancer with lumpectomy with radioactive seed localization 08/28/2020.  Per chart review patient lives with her children.  Bed bath main level of home with 4 steps to entry.  Son works third shift at home during the day.  She is a retired Pharmacist, hospital reportedly independent prior to admission.  Presented 01/10/2022 with acute onset of left-sided weakness and slid  out of bed.  She attempted to stand hitting the back of her head without loss of consciousness.  Cranial CT scan showed intraparenchymal hematoma in the right parasagittal frontal lobe measuring 3.0 cm AP by 2.3 cm TV by 3.0 cm with mild surrounding edema but no midline shift.  CT cervical spine maxillofacial negative.  Initial EEG negative.  Overnight EEG with video showed seizure arising from right frontal temporal region.  Admission chemistry unremarkable except glucose 108, WBC 12,700.  Echocardiogram ejection fraction of 60 to 65% no wall motion abnormalities.  Patient has been loaded with Keppra for seizure prophylaxis.  Follow-up MRI of the brain as well as angio head and neck showed acute/subacute hematoma right pericallosal frontal lobe unchanged.  MRA negative.  Tolerating a regular diet.  Therapy evaluations completed due to patient decreased functional mobility was admitted for a comprehensive rehab program. ?  ?Complete NIHSS TOTAL: 3 ?  ?Patient's medical record from [facility] has been reviewed by the rehabilitation admission coordinator and physician. ?  ?Past Medical History  ?    ?Past Medical History:  ?Diagnosis Date  ? Breast cancer ([place])    ? Colon polyps    ? Depression    ?  history of  ? Diverticulitis    ?  GERD (gastroesophageal reflux disease)    ? Hypertension    ? Osteoporosis    ? Seizures ([place])    ? Stroke [facility])    ?  ?  ?Has the patient had major surgery during 100 days prior to admission? No ?  ?Family History   ?family history includes Cancer in her mother; Heart disease in her mother; Stomach cancer in her maternal grandfather; Stroke in her paternal grandmother. ?  ?Current Medications ?  ?Current Facility-Administered Medications:  ?  acetaminophen (TYLENOL) tablet 650 mg, 650 mg, Oral, Q4H PRN, 650 mg at 01/16/22 1002 **OR** acetaminophen (TYLENOL) 160 MG/5ML solution 650 mg, 650 mg, Per Tube, Q4H PRN **OR** acetaminophen (TYLENOL) suppository 650 mg, 650 mg,  Rectal, Q4H PRN, Rosalin Hawking, MD ?  docusate sodium (COLACE) capsule 100 mg, 100 mg, Oral, Daily, Danford, Suann Larry, MD, 100 mg at 01/17/22 1035 ?  labetalol (NORMODYNE) injection 5-20 mg, 5-20 mg, Intravenous, Q2H PRN, Rosalin Hawking, MD ?  levETIRAcetam (KEPPRA) tablet 1,000 mg, 1,000 mg, Oral, BID, Danford, Suann Larry, MD, 1,000 mg at 01/18/22 0844 ?  LORazepam (ATIVAN) injection 2 mg, 2 mg, Intravenous, PRN, Rosalin Hawking, MD ?  losartan (COZAAR) tablet 100 mg, 100 mg, Oral, Daily, Rosalin Hawking, MD, 100 mg at 01/18/22 0844 ?  pantoprazole (PROTONIX) EC tablet 40 mg, 40 mg, Oral, Daily, Rosalin Hawking, MD, 40 mg at 01/18/22 0844 ?  venlafaxine XR (EFFEXOR-XR) 24 hr capsule 75 mg, 75 mg, Oral, Q breakfast, Pham, Minh Q, RPH-CPP, 75 mg at 01/18/22 0844 ?  vitamin B-12 (CYANOCOBALAMIN) tablet 1,000 mcg, 1,000 mcg, Oral, Daily, Rosalin Hawking, MD, 1,000 mcg at 01/18/22 0844 ?  ?Patients Current Diet:  ?Diet Order   ?  ?         ?    Diet regular Room service appropriate? Yes with Assist; Fluid consistency: Thin  Diet effective now       ?  ?  ?   ?  ?  ?   ?  ?  ?Precautions / Restrictions ?Precautions ?Precautions: Fall ?Precaution Comments: monitor HR ?Restrictions ?Weight Bearing Restrictions: No ?Other Position/Activity Restrictions: tendency to stand on toes on L foot due to extension tone  ?  ?Has the patient had 2 or more falls or a fall with injury in the past year? Yes ?  ?Prior Activity Level ?Community (5-7x/wk): Pt. active in the community PTA ?  ?Prior Functional Level ?Self Care: Did the patient need help bathing, dressing, using the toilet or eating? Independent ?  ?Indoor Mobility: Did the patient need assistance with walking from room to room (with or without device)? Independent ?  ?Stairs: Did the patient need assistance with internal or external stairs (with or without device)? Independent ?  ?Functional Cognition: Did the patient need help planning regular tasks such as shopping or remembering to  take medications? Needed some help ?  ?Patient Information ?Are you of Hispanic, Latino/a,or Spanish origin?: A. No, not of Hispanic, Latino/a, or Spanish origin ?What is your race?: A. White ?Do you need or want an interpreter to communicate with a doctor or health care staff?: 0. No ?  ?Patient's Response To:  ?Health Literacy and Transportation ?Is the patient able to respond to health literacy and transportation needs?: Yes ?Health Literacy - How often do you need to have someone help you when you read instructions, pamphlets, or other written material from your doctor or pharmacy?: Never ?In the past 12 months, has lack of transportation kept you  from medical appointments or from getting medications?: No ?In the past 12 months, has lack of transportation kept you from meetings, work, or from getting things needed for daily living?: No ?  ?Home Assistive Devices / Equipment ?Home Assistive Devices/Equipment: None ?Home Equipment: None ?  ?Prior Device Use: Indicate devices/aids used by the patient prior to current illness, exacerbation or injury? None of the above ?  ?Current Functional Level ?Cognition ?  Arousal/Alertness: Awake/alert ?Overall Cognitive Status: Impaired/Different from baseline ?Orientation Level: Oriented X4 ?Following Commands: Follows one step commands with increased time ?Safety/Judgement: Decreased awareness of safety, Decreased awareness of deficits ?General Comments: pt slow to process, answers questions appropriately however doesn't execute tasks asked. ie. stating she knew she had a BM but never called for someone to clean her up ?Attention: Selective ?Selective Attention: Impaired ?Selective Attention Impairment: Verbal basic ?Memory: Impaired ?Memory Impairment: Retrieval deficit, Decreased recall of new information ?Awareness: Impaired ?Awareness Impairment: Intellectual impairment, Emergent impairment ?Problem Solving: Impaired ?Problem Solving Impairment: Verbal  complex ?Safety/Judgment: Impaired ?   ?Extremity Assessment ?(includes Sensation/Coordination) ?  Upper Extremity Assessment: LUE deficits/detail ?LUE Deficits / Details: pt with ~40* shoulder flexion, weak grasp, frequentl

## 2022-01-21 NOTE — Progress Notes (Signed)
Occupational Therapy TBI Note ? ?Patient Details  ?Name: Cynthia [name] ?MRN: [id number] ?Date of Birth: 1946-08-19 ? ?Today's Date: 01/21/2022 ?OT Individual Time: 3295-1884 ?OT Individual Time Calculation (min): 55 min  ? ? ?Short Term Goals: ?Week 1:  OT Short Term Goal 1 (Week 1): Pt will complete toilet transfer with max A of 1 to [facility] to promote OOB toileting ?OT Short Term Goal 2 (Week 1): Pt will complete LB dressing mod A at the sit > stand level with AE PRN ?OT Short Term Goal 3 (Week 1): Pt will maintain dynamic sitting balance with supervision during ADL task ? ?Skilled Therapeutic Interventions/Progress Updates:  ?  Pt received in bed in soaked brief.  Pt eager to wash her hair due to glue in hair from EKG.  Pt able to sit to EOB with A to move LLE.  Once sitting she did lean back and to the L partially but did respond to cues to engage her core to come to midline.  Pt used to do karate and was able to understand cues to engage core. Pt does have more active movement of her LUE and able to reach arm toward steady bar with min A. Using B hands, able to come to stand with light CGA to close S.  In stedy, leans L with cues to bring body in midline.  In standing in stedy, leans back on R heel to stand posteriorly. Used cue "to bring belly button to bar" which she was able to do. ?Transferred to tub bench in shower with stedy. Assisted pt with washing hair and she worked on her body with cues for sequencing, midline awareness, and cues to use LUE as much as possible. Pt continually leaning L onto wall but could self correct with cues.  Transported from bench to wc in stedy. Donned new brief in stedy.  Donned UB clothing in sitting with mod A due to tighter fitting clothing.  NT arrived for +2 A to stand pt without stedy to don clothing over hips.  ?PT arrived for next session.  ? ?Therapy Documentation ?Precautions:  ?Precautions ?Precautions: Fall ?Precaution Comments: L LE extensor  tone ?Restrictions ?Weight Bearing Restrictions: No ?Other Position/Activity Restrictions: tendency to stand on toes on L foot due to extension tone/L ankle contracture ? ?  ?Pain: ?Pain Assessment ?Pain Scale: 0-10 ?Pain Score: 0-No pain ?Agitated Behavior Scale: ?TBI ?Observation Details ?Observation Environment: CIR ?Start of observation period - Date: 01/21/22 ?Start of observation period - Time: 1030 ?End of observation period - Date: 01/21/22 ?End of observation period - Time: 1125 ?Agitated Behavior Scale (DO NOT LEAVE BLANKS) ?Short attention span, easy distractibility, inability to concentrate: Present to a moderate degree ?Impulsive, impatient, low tolerance for pain or frustration: Absent ?Uncooperative, resistant to care, demanding: Absent ?Violent and/or threatening violence toward people or property: Absent ?Explosive and/or unpredictable anger: Absent ?Rocking, rubbing, moaning, or other self-stimulating behavior: Absent ?Pulling at tubes, restraints, etc.: Absent ?Wandering from treatment areas: Absent ?Restlessness, pacing, excessive movement: Absent ?Repetitive behaviors, motor, and/or verbal: Absent ?Rapid, loud, or excessive talking: Absent ?Sudden changes of mood: Absent ?Easily initiated or excessive crying and/or laughter: Absent ?Self-abusiveness, physical and/or verbal: Absent ?Agitated behavior scale total score: 16 ? ?ADL: ?ADL ?Eating: Set up ?Where Assessed-Eating: Bed level ?Grooming: Supervision/safety ?Where Assessed-Grooming: Edge of bed ?Upper Body Bathing: Supervision/safety ?Where Assessed-Upper Body Bathing: Shower ?Lower Body Bathing: Moderate assistance ?Where Assessed-Lower Body Bathing: Shower ?Upper Body Dressing: Moderate assistance ?Where Assessed-Upper Body Dressing: Wheelchair ?Lower Body Dressing:  Maximal assistance ?Where Assessed-Lower Body Dressing: Wheelchair ?Toileting: Maximal assistance ?Where Assessed-Toileting: Bed level ?Toilet Transfer: Dependent ?Toilet  Transfer Method: Other (comment) (stedy) ?Toilet Transfer Equipment: Bedside commode ?Tub/Shower Transfer: Unable to assess ?Tub/Shower Transfer Method: Unable to assess ?Walk-In Shower Transfer: Minimal assistance (WITH USE OF STEDY) ?Walk-In Shower Transfer Method:  (STEDY) ?Walk-In Shower Equipment: Radio broadcast assistant ? ?Therapy/Group: Individual Therapy ? ?Cameron ?01/21/2022, 12:45 PM ?

## 2022-01-21 NOTE — Progress Notes (Addendum)
?                                                       PROGRESS NOTE ? ? ?Subjective/Complaints: ?No new complaints or concerns.  ? ?Review of Systems  ?Constitutional:  Negative for chills and fever.  ?Respiratory:  Negative for cough and shortness of breath.   ?Cardiovascular:  Negative for chest pain.  ?Gastrointestinal:  Negative for abdominal pain, constipation, diarrhea, nausea and vomiting.  ?Musculoskeletal:  Negative for back pain and joint pain.  ?Neurological:  Negative for headaches.   ? ?Objective: ?  ?No results found. ?Recent Labs  ?  01/19/22 ?3785  ?WBC 6.5  ?HGB 12.6  ?HCT 36.1  ?PLT 298  ? ? ?Recent Labs  ?  01/19/22 ?8850  ?NA 139  ?K 3.6  ?CL 105  ?CO2 28  ?GLUCOSE 104*  ?BUN 19  ?CREATININE 0.68  ?CALCIUM 9.0  ? ? ? ?Intake/Output Summary (Last 24 hours) at 01/21/2022 0710 ?Last data filed at 01/20/2022 1826 ?Gross per 24 hour  ?Intake 840 ml  ?Output --  ?Net 840 ml  ? ?  ? ?  ? ?Physical Exam: ?Vital Signs ?Blood pressure 130/74, pulse 85, temperature 97.6 ?F (36.4 ?C), resp. rate 16, height '5\' 6"'$  (1.676 m), weight 61.4 kg, SpO2 96 %. ? ? ? ?Assessment/Plan: ?1. Functional deficits which require 3+ hours per day of interdisciplinary therapy in a comprehensive inpatient rehab setting. ?Physiatrist is providing close team supervision and 24 hour management of active medical problems listed below. ?Physiatrist and rehab team continue to assess barriers to discharge/monitor patient progress toward functional and medical goals ? ?Care Tool: ? ?Bathing ?   ?Body parts bathed by patient: Right arm, Left arm, Chest, Abdomen, Front perineal area, Right upper leg, Left upper leg, Face  ? Body parts bathed by helper: Right lower leg, Left lower leg, Buttocks ?  ?  ?Bathing assist Assist Level: Moderate Assistance - Patient 50 - 74% ?  ?  ?Upper Body Dressing/Undressing ?Upper body dressing   ?What is the patient wearing?: Pull over shirt ?   ?Upper body assist Assist Level: Maximal Assistance - Patient  25 - 49% ?   ?Lower Body Dressing/Undressing ?Lower body dressing ? ? ?   ?What is the patient wearing?: Incontinence brief ? ?  ? ?Lower body assist Assist for lower body dressing: Total Assistance - Patient < 25% ?   ? ?Toileting ?Toileting    ?Toileting assist Assist for toileting: 2 Helpers ?  ?  ?Transfers ?Chair/bed transfer ? ?Transfers assist ?   ? ?Chair/bed transfer assist level: Maximal Assistance - Patient 25 - 49% ?  ?  ?Locomotion ?Ambulation ? ? ?Ambulation assist ? ?   ? ?Assist level: 2 helpers ?Assistive device: Hand held assist ?Max distance: 5  ? ?Walk 10 feet activity ? ? ?Assist ? Walk 10 feet activity did not occur: Safety/medical concerns ? ?  ?   ? ?Walk 50 feet activity ? ? ?Assist Walk 50 feet with 2 turns activity did not occur: Safety/medical concerns ? ?  ?   ? ? ?Walk 150 feet activity ? ? ?Assist Walk 150 feet activity did not occur: Safety/medical concerns ? ?  ?  ?  ? ?Walk 10 feet on uneven surface  ?activity ? ? ?Assist Walk  10 feet on uneven surfaces activity did not occur: Safety/medical concerns ? ? ?  ?   ? ?Wheelchair ? ? ? ? ?Assist Is the patient using a wheelchair?: Yes ?Type of Wheelchair: Manual ?  ? ?Wheelchair assist level: Total Assistance - Patient < 25% ?Max wheelchair distance: 150  ? ? ?Wheelchair 50 feet with 2 turns activity ? ? ? ?Assist ? ?  ?  ? ? ?Assist Level: Total Assistance - Patient < 25%  ? ?Wheelchair 150 feet activity  ? ? ? ?Assist ?   ? ? ?Assist Level: Total Assistance - Patient < 25%  ? ?Blood pressure 130/74, pulse 85, temperature 97.6 ?F (36.4 ?C), resp. rate 16, height '5\' 6"'$  (1.676 m), weight 61.4 kg, SpO2 96 %. ? ? ? ?General: Alert, No apparent distress. Sitting in bed ?HEENT: Head is normocephalic,  PERRLA, EOMI, sclera anicteric, oral mucosa pink and moist, bruising noted on face ?Neck: Supple  ?Heart: Reg rate and rhythm. No murmurs rubs or gallops ?Chest: CTA bilaterally without wheezes, rales, or rhonchi; no distress ?Abdomen: Soft,  non-tender, non-distended, bowel sounds positive. ?Extremities: No clubbing, cyanosis, or edema. ?Psych: Pt's affect is appropriate. Pt is cooperative ?Skin: Clean and intact without signs of breakdown ?Neuro:  Alert. Some neglect of left side. Follows commands. ?CN 2-12 intact other than possibly decreased shrug on Left ?Musculoskeletal: 5/5 RUE and RLE ?3-4-/5 in LUE ?L HF 4-/5, Knee extension 4/5, Ankle FP/DF 3/5 ?RUE bruising  ?No ataxia or dysmetria noted ? ?Medical Problem List and Plan: ?1. Functional deficits secondary to intraparenchymal hematoma in the right rontal lobe ?            -patient may  shower ?            -ELOS/Goals: 12-14 days supervision ? -Completed gait training for 30 ft ?            -Continue CIR for PT, OT and SLP ? -Working with SLP to address cognitive changes ?2.  Antithrombotics: ?-DVT/anticoagulation:  Mechanical: Antiembolism stockings, thigh (TED hose) Bilateral lower extremities ?            No Lovenoxetc due to IPH ?            -antiplatelet therapy: N/A ?3. Pain Management: ?As needed ?4. Mood: Effexor 75 mg daily ?            -antipsychotic agents: N/A ? -5/11 Good mood today, continue effexor '75mg'$  ? -5/12 mood continues to be good overall, continue to follow ?5. Neuropsych: This patient is capable of making decisions on her own behalf. ?6. Skin/Wound Care: Routine skin check ?7. Fluids/Electrolytes/Nutrition: Routine in and out with follow-up chemistries ? -5/11 midline removed yesterday ?8.  Seizure disorder- new onset since IPH-.  Keppra 1000 mg every 12 hours ?9.  Hypertension.  Cozaar 100 mg daily.  Monitor with increased mobility ? -5/12 BP continues to be well controlled, continue Cozaar '100mg'$  daily ?10.  History of left breast cancer with radioactive seed localization 08/28/2020.  Follow-up outpatient ?11. Hx of R frontal ICH 2017 ?12. Poor appetite- might need supplements or appetite stimulants ? -5/10 albumin a little low, encourage PO intake ? -5/11 ate all her  breakfast. Continue to encourage eating ? -5/12 offered nutrition supplement, she declined ? ?LOS: ?2 days ?A FACE TO FACE EVALUATION WAS PERFORMED ? ?Cynthia [name] ?01/21/2022, 7:10 AM  ? ?  ?

## 2022-01-21 NOTE — Progress Notes (Signed)
Physical Therapy Session Note ? ?Patient Details  ?Name: Cynthia [name] ?MRN: [id number] ?Date of Birth: Nov 18, 1945 ? ?Today's Date: 01/21/2022 ?PT Individual Time: 3545-6256 ?PT Individual Time Calculation (min): 40 min  ? ?Short Term Goals: ?Week 1:  PT Short Term Goal 1 (Week 1): Patient will complete supine <> sit with MinA consistently ?PT Short Term Goal 2 (Week 1): Patient will complete sit <> stand with LRAD and ModA consistently ?PT Short Term Goal 3 (Week 1): Patient will ambulate at least 79f with LRAD and ModA + wc follow as needed ?PT Short Term Goal 4 (Week 1): Patient will initiate stair training ? ?Skilled Therapeutic Interventions/Progress Updates:  ?  Patient received sitting up in wc with OT, agreeable to PT. She denies pain. Patient able to attempt to don socks with B UE on R LE, unable to don L sock without TotalA due to persistent L lateral lean in wc. Shoes donned TotalA. Patient able to brush the R side of her head, but needed PT to pull attention to L side of head for patient to attempt to cross midline and brush her hair- requested assist from PT. PT brushing patients hair removing EEG glue and reviewing s/s of a stroke with patient. She was able to recall events leading up to her hospitalization and the support she currently has at home. Patient requesting to use the bathroom reporting "I think I'm already going." MaxA stand pivot to toilet with 2nd person to assist in clothing management. Patient incontinent of bladder in brief and small continent void into toilet. Supervision for perihygiene in sitting. TotalA clothing management in standing with MaxA stand pivot back to wc. Patient remaining up in wc, seatbelt alarm on, call light within reach.  ? ?Therapy Documentation ?Precautions:  ?Precautions ?Precautions: Fall ?Precaution Comments: L LE extensor tone ?Restrictions ?Weight Bearing Restrictions: No ?Other Position/Activity Restrictions: tendency to stand on toes on L foot due to  extension tone/L ankle contracture ? ? ? ?Therapy/Group: Individual Therapy ? ?JDebbora Dus?JDebbora Dus PT, DPT, CBIS ? ?01/21/2022, 7:50 AM  ?

## 2022-01-21 NOTE — Progress Notes (Signed)
Patient ID: Cynthia [name], female   DOB: 12/24/1945, 76 y.o.   MRN: [id number] ?Met with the patient to review rehab process, team conference and plan of care. Discussed medications, sx management and renewal of keppra. Patient noted she manages her medications using a pill box and alarm on her phone. Still working on continence management with toileting. Continue to follow along to discharge to address educational needs to facilitate preparation for discharge home. ,  B ? ?

## 2022-01-21 NOTE — Progress Notes (Signed)
Physical Therapy Session Note ? ?Patient Details  ?Name: Cynthia [name] ?MRN: [id number] ?Date of Birth: [birth year] ? ?Today's Date: 01/21/2022 ?PT Individual Time: 4098-1191 ?PT Individual Time Calculation (min): 55 min  ? ?Short Term Goals: ?Week 1:  PT Short Term Goal 1 (Week 1): Patient will complete supine <> sit with MinA consistently ?PT Short Term Goal 2 (Week 1): Patient will complete sit <> stand with LRAD and ModA consistently ?PT Short Term Goal 3 (Week 1): Patient will ambulate at least 80f with LRAD and ModA + wc follow as needed ?PT Short Term Goal 4 (Week 1): Patient will initiate stair training ? ? ?Skilled Therapeutic Interventions/Progress Updates:  ? ?Pt received supine in bed and agreeable to PT. Supine>sit transfer with min assist at the LLE through long sitting. Squat pivot Transfer swith mod assist throughout session to R and L with increased time  to initiate.  ? ?Sit<>stand in parallel bars with min-mod assist x 5 with max cues for posture and use of BUE to push to standing on rails.   ? ?Gait in parallel bars forward reverse 3 x 870fwith mod-max assist for posture, step length L and increased Weight bearing through the L heel as possible.   ? ?Heel cord stretch to the LLE 3 x 2 min with only mild increaes in ROM throughout.  ? ?Nustep reciprocal movement training x 8 minutes with cues for full ROM and encouragement to focus on heel contact on the LLE with LLE flexion..  ? ?[facility] to [facility] and [facility] and all needs met.   ? ?   ? ?Therapy Documentation ?Precautions:  ?Precautions ?Precautions: Fall ?Precaution Comments: L LE extensor tone ?Restrictions ?Weight Bearing Restrictions: No ?Other Position/Activity Restrictions: tendency to stand on toes on L foot due to extension tone/L ankle contracture ? ?Vital Signs: ?Therapy Vitals ?Temp: 98.6 ?F (37 ?C) ?Pulse Rate: 99 ?Resp: 18 ?BP: 126/65 ?Patient Position (if appropriate): Sitting ?Oxygen  Therapy ?SpO2: 98 % ?O2 Device: Room Air ?Pain: ?denies ? ? ? ?Therapy/Group: Individual Therapy ? ?AuLorie Phenix5/08/2022, 4:55 PM  ?

## 2022-01-21 NOTE — Progress Notes (Signed)
Speech Language Pathology TBI Note ? ?Patient Details  ?Name: Cynthia [name] ?MRN: [id number] ?Date of Birth: 11-Feb-1946 ? ?Today's Date: 01/21/2022 ?SLP Individual Time: 0901-1000 ?SLP Individual Time Calculation (min): 59 min ? ?Short Term Goals: ?Week 1: SLP Short Term Goal 1 (Week 1): Pt will demonstrate recall of daily, novel information with use of internal/external aids given mod A verbal cues ?SLP Short Term Goal 2 (Week 1): Pt will demonstrate basic complex problem solving with min A verbal cues. ?SLP Short Term Goal 3 (Week 1): Pt will self-monitor and self-correct errors in functional task with mod A verbal cues. ?SLP Short Term Goal 4 (Week 1): Pt will demonstrate alternating attention within 10 minute intervals with mod A verbal cues. ? ?Skilled Therapeutic Interventions: ?Pt seen for skilled ST with focus on cognitive goals, pt in bed and agreeable to therapeutic tasks. With prompts, pt able to ID cognitive changes since hospitalization. During unstructured, informal conversation pt with no difficulty communication baseline function, goals, desires to return to tutoring, etc. However when structured therapeutic tasks were introduced, pt requiring max-total A to attend to and attempt to complete basic cognitive activities. During basic money counting tasks, pt requiring >25 minutes for participation, unable to accurately complete any problems due to poor attention and confusion despite max A cues and SLP providing organization/structure. Pt able to complete 1/6 functional math problems, demonstrating difficulty with working memory. Pt does express frustration with current function and reports tasks were "really hard". Pt was left in bed with alarm set and all needs within reach, cont ST POC. ? ?Pain ?Pain Assessment ?Pain Scale: 0-10 ?Pain Score: 0-No pain ? ?Agitated Behavior Scale: ?TBI ?Observation Details ?Observation Environment: CIR ?Start of observation period - Date: 01/21/22 ?Start of  observation period - Time: 0900 ?End of observation period - Date: 01/21/22 ?End of observation period - Time: 1000 ?Agitated Behavior Scale (DO NOT LEAVE BLANKS) ?Short attention span, easy distractibility, inability to concentrate: Present to a moderate degree ?Impulsive, impatient, low tolerance for pain or frustration: Absent ?Uncooperative, resistant to care, demanding: Absent ?Violent and/or threatening violence toward people or property: Absent ?Explosive and/or unpredictable anger: Absent ?Rocking, rubbing, moaning, or other self-stimulating behavior: Absent ?Pulling at tubes, restraints, etc.: Absent ?Wandering from treatment areas: Absent ?Restlessness, pacing, excessive movement: Absent ?Repetitive behaviors, motor, and/or verbal: Absent ?Rapid, loud, or excessive talking: Absent ?Sudden changes of mood: Absent ?Easily initiated or excessive crying and/or laughter: Absent ?Self-abusiveness, physical and/or verbal: Absent ?Agitated behavior scale total score: 16 ? ?Therapy/Group: Individual Therapy ? ?[name] ?01/21/2022, 10:03 AM ?

## 2022-01-21 NOTE — IPOC Note (Signed)
Overall Plan of Care (IPOC) ?Patient Details ?Name: Cynthia [name] ?MRN: [id number] ?DOB: 11/11/1945 ? ?Admitting Diagnosis: Intracerebral hemorrhage ([place]) ? ?Hospital Problems: Principal Problem: ?  Intracerebral hemorrhage ?Active Problems: ?  ICH (intracerebral hemorrhage) ([place]) ?  Left-sided visual neglect ?  Partial symptomatic epilepsy with complex partial seizures, not intractable, without status epilepticus ([place]) ?  Mood disorder in conditions classified elsewhere ?  Poor appetite ? ? ? ? Functional Problem List: ?Nursing Bladder, Bowel, Safety, Pain, Endurance  ?PT Balance, Behavior, Endurance, Motor, Nutrition, Pain, Perception, Safety, Sensory, Skin Integrity  ?OT Balance, Cognition, Endurance, Vision, Skin Integrity, Perception, Safety, Pain, Motor  ?SLP Cognition  ?TR    ?    ? Basic ADL?s: ?OT Bathing, Dressing, Toileting  ? ?  Advanced  ADL?s: ?OT Simple Meal Preparation  ?   ?Transfers: ?PT Bed Mobility, Bed to Chair, Car, Furniture  ?OT Toilet, Tub/Shower  ? ?  Locomotion: ?PT Ambulation, Wheelchair Mobility, Stairs  ? ?  Additional Impairments: ?OT Fuctional Use of Upper Extremity  ?SLP Social Cognition ?  ?Problem Solving, Memory, Awareness, Attention  ?TR    ? ? ?Anticipated Outcomes ?Item Anticipated Outcome  ?Self Feeding Mod I  ?Swallowing ?   ?  ?Basic self-care ? Supervision  ?Toileting ? CGA ?  ?Bathroom Transfers CGA  ?Bowel/Bladder ? manage bowel w mod I and bladder w toileting  ?Transfers ? spv  ?Locomotion ? CGA  ?Communication ?    ?Cognition ? min-supervision A  ?Pain ? manage pain at or below level 4 with prns  ?Safety/Judgment ? manage safety w cues  ? ?Therapy Plan: ?PT Intensity: Minimum of 1-2 x/day ,45 to 90 minutes ?PT Frequency: 5 out of 7 days ?PT Duration Estimated Length of Stay: 2-3 weeks ?OT Intensity: Minimum of 1-2 x/day, 45 to 90 minutes ?OT Frequency: 5 out of 7 days ?OT Duration/Estimated Length of Stay: 2-3 weeks ?SLP Intensity: Minumum of 1-2 x/day, 30 to 90  minutes ?SLP Frequency: 3 to 5 out of 7 days ?SLP Duration/Estimated Length of Stay: 12-14 days ? ? Team Interventions: ?Nursing Interventions Bladder Management, Disease Management/Prevention, Medication Management, Discharge Planning, Pain Management, Bowel Management, Patient/Family Education  ?PT interventions Ambulation/gait training, Cognitive remediation/compensation, Discharge planning, DME/adaptive equipment instruction, Functional mobility training, Pain management, Psychosocial support, Splinting/orthotics, Therapeutic Activities, UE/LE Strength taining/ROM, Visual/perceptual remediation/compensation, Training and development officer, Community reintegration, Disease management/prevention, Functional electrical stimulation, Neuromuscular re-education, Patient/family education, Skin care/wound management, Stair training, Therapeutic Exercise, UE/LE Coordination activities, Wheelchair propulsion/positioning  ?OT Interventions Balance/vestibular training, Discharge planning, Pain management, Self Care/advanced ADL retraining, Therapeutic Activities, UE/LE Coordination activities, Cognitive remediation/compensation, Functional mobility training, Disease mangement/prevention, Patient/family education, Skin care/wound managment, Therapeutic Exercise, Visual/perceptual remediation/compensation, DME/adaptive equipment instruction, Neuromuscular re-education, Psychosocial support, UE/LE Strength taining/ROM, Wheelchair propulsion/positioning  ?SLP Interventions Cognitive remediation/compensation, Cueing hierarchy, Functional tasks, Medication managment, Patient/family education, Internal/external aids  ?TR Interventions    ?SW/CM Interventions Discharge Planning, Psychosocial Support, Patient/Family Education  ? ?Barriers to Discharge ?MD  Medical stability, Home enviroment access/loayout, Incontinence, and hemiparesis,  intraparenchymal hematoma in the right rontal lobe, HTN, seizure disorder  ?Nursing Decreased  caregiver support, Home environment access/layout ?2 leve 4ste, main B+B with son/family; works 3rd shift and home during the day  ?PT Decreased caregiver support, Inaccessible home environment, Home environment access/layout, Lack of/limited family support, Other (comments) ?previous CVAs  ?OT Home environment access/layout, Lack of/limited family support, Incontinence ?   ?SLP   ?   ?SW Lack of/limited family support, Insurance for SNF coverage ?   ? ?  Team Discharge Planning: ?Destination: PT-Home ,OT- Home , SLP-Home ?Projected Follow-up: PT-Home health PT, 24 hour supervision/assistance, OT-  Home health OT, SLP-24 hour supervision/assistance, Outpatient SLP ?Projected Equipment Needs: PT-To be determined, OT- To be determined, SLP-None recommended by SLP ?Equipment Details: PT- , OT-  ?Patient/family involved in discharge planning: PT- Patient,  OT-Patient, SLP-Patient ? ?MD ELOS: 12-14 ?Medical Rehab Prognosis:  Excellent ?Assessment: The patient has been admitted for CIR therapies with the diagnosis of  intraparenchymal hematoma in the right rontal lobe. The team will be addressing functional mobility, strength, stamina, balance, safety, adaptive techniques and equipment, self-care, bowel and bladder mgt, patient and caregiver education. Goals have been set at supervision. Anticipated discharge destination is home. ? ? ?  ? ? ?See Team Conference Notes for weekly updates to the plan of care  ?

## 2022-01-22 DIAGNOSIS — G40909 Epilepsy, unspecified, not intractable, without status epilepticus: Secondary | ICD-10-CM

## 2022-01-22 NOTE — Progress Notes (Signed)
Physical Therapy Session Note ? ?Patient Details  ?Name: Cynthia [name] ?MRN: [id number] ?Date of Birth: 1945-12-18 ? ?Today's Date: 01/22/2022 ?PT Individual Time: 0805-0900 ?PT Individual Time Calculation (min): 55 min  ? ?Short Term Goals: ?Week 1:  PT Short Term Goal 1 (Week 1): Patient will complete supine <> sit with MinA consistently ?PT Short Term Goal 2 (Week 1): Patient will complete sit <> stand with LRAD and ModA consistently ?PT Short Term Goal 3 (Week 1): Patient will ambulate at least 52f with LRAD and ModA + wc follow as needed ?PT Short Term Goal 4 (Week 1): Patient will initiate stair training ?Week 2:    ?Week 3:    ? ?Skilled Therapeutic Interventions/Progress Updates:  ?  Pt denies pain. ?Dons pants w/max assist/tights.  Able to bridge w/cueing to utilize L hemibody w/bridge and hand for raising pants.  Rolls w/max cues for L hemibody positioning/use and side to sit w/max cues for sequencing, difficulty releasing grasp w/L hand. ?In sitting, dons shoes w/mod assist, hand over hand to use LUE, post/L lean/mod assist for dynamic balance, when crossing ankle over knee w/task. ?Stand pivot to wc w/max cues for sequening, max assist overall, apraxic w/all above. ? ?Commode transfer w/max assist, max cues for sequencing w/pivot to L side, apraxic ?Pt continent of urine on commode, incontinent of diarreah in brief.  In sitting attempts to reach for washcloth on floor on L using L hand, cga for balance. ?Hygiene w/set up in sitting, mod assist for balance in standing, pt w/diarreah requiring extended time and cues to adequately wash perineal area, cues to attend to L hand placement w/transitions, Leans L and w/increased flexed posture/cues for midline, mod assist. ?Max assist to raise clean brief/pants.  Max assist commode to wc. ?At sink pt washes hands w/cues for L UE use/cleaning. Oral hygiene w/set up. ? ?Transported to gym ?Standing w/L foot on blue wedge and therapist assisting w/forward rotation  of L hip, manual assist at knee to prevent hyperexension, and manual subtalar eversion performed prolonged stretching in standing of L ankle/foot x 5-6 min. ? ?Pt left oob in wc w/alarm belt set and needs in reach ? ? ?Therapy Documentation ?Precautions:  ?Precautions ?Precautions: Fall ?Precaution Comments: L LE extensor tone ?Restrictions ?Weight Bearing Restrictions: No ?Other Position/Activity Restrictions: tendency to stand on toes on L foot due to extension tone/L ankle contracture ?  ?Other Treatments:   ? ? ? ?Therapy/Group: Individual Therapy ?BCallie Fielding PT ? ? ?BJerrilyn Cairo?01/22/2022, 9:03 AM  ?

## 2022-01-22 NOTE — Progress Notes (Signed)
Occupational Therapy Session Note ? ?Patient Details  ?Name: Cynthia [name] ?MRN: [id number] ?Date of Birth: 11-03-1945 ? ?Today's Date: 01/22/2022 ?OT Individual Time: 1000-1100 and 1435-1505 ?OT Individual Time Calculation (min): 60 min and 30 min ? ? ?Short Term Goals: ?Week 1:  OT Short Term Goal 1 (Week 1): Pt will complete toilet transfer with max A of 1 to [facility] to promote OOB toileting ?OT Short Term Goal 2 (Week 1): Pt will complete LB dressing mod A at the sit > stand level with AE PRN ?OT Short Term Goal 3 (Week 1): Pt will maintain dynamic sitting balance with supervision during ADL task ? ?Skilled Therapeutic Interventions/Progress Updates:  ?  Visit 1: ?No c/o pain ?Pt received in wc ready for therapy. Pt transported to gym and worked on squat pivot transfer to R to mat with mod A for forward wt shift and hip pivot. ?On mat, focused on postural control of midline awareness, upright posture as she sits in kyphotic posture with L lean.  ?Pt worked on a variety of reaching exercises to facilitate trunk lengthening with reaching and opposing side contraction.  She did best with closed chain type of activities (rolling out ball to side, or tilting heaving stool by its handle), more A needed with open chain of reaching for objects, and pt really struggled with dual tasking such as maintaining balance while holding a dowel bar in hands and reaching it forward and back.  ?Tried using mirror for visual feedback but it seemed to add more confusion with directionality. ? ?Discussed the need to develop dual tasking skills. In shower yesterday her balance declined as soon as she focused on bathing vs just sitting still.  ?Worked on forward wt shift with hip lift to prep for transfers.  ?Pt saw a therapist in the gym that she had worked with in 2017 while in [place],  recognizing therapist's name and face.   ?She transferred back to wc with mod A.  Wedge wc cushion under L hip to promote pelvic symmetry. ? ?Pt returned to  room to sit in wc with lap tray support and all needs met.  ?Pt did participate well and responded well to cues.   ? ?Visit 2: ?Pain; no c/o pain  ? ?Pt received in w/c in room. Asked pt if she would like to toilet and pt said yes, but she had already urinated in her brief.  Suggested we go to the toilet to fully cleanse and try to use the toilet further.   ?Pt stood up in stedy from wc with close S using B hands on bar.  Improved midline control in stedy today with only slight L lean.  Transferred to [facility] over toilet.  Cues to stand up tall by bring "belly to the bar" as a visual cue which pt was able to implement.  Her brief was quite soaked but once she sat down she was able to urinate further on the toilet.  Cleansed with paper in sitting and then stood in stedy to wash her LB with cloths as she had been in a wet brief. In stedy with frequent cues, pt was actually able to stand upright with L hand on bar not needing support from shin rest or pads. ?She needed max A with clothing management as she was wearing tight leggings.  ?Pt then taken to sink in stedy to work on upright symmetrical posture to wash her hands at sink. Pt was in a posterior lean relying on L hand  to hold her up so cued her to shift forward so she could release her hand to wash it.  Set up recliner for pt. Pt resting in recliner with belt alarm on and pillow supporting LUE.  Call light and phone in reach.  ? ? ?Therapy Documentation ?Precautions:  ?Precautions ?Precautions: Fall ?Precaution Comments: L LE extensor tone ?Restrictions ?Weight Bearing Restrictions: No ?Other Position/Activity Restrictions: tendency to stand on toes on L foot due to extension tone/L ankle contracture ? ?Pain: ?Pain Assessment ?Pain Scale: 0-10 ?Pain Score: 0-No pain ?ADL: ?ADL ?Eating: Set up ?Where Assessed-Eating: Bed level ?Grooming: Supervision/safety ?Where Assessed-Grooming: Edge of bed ?Upper Body Bathing: Supervision/safety ?Where Assessed-Upper Body  Bathing: Shower ?Lower Body Bathing: Moderate assistance ?Where Assessed-Lower Body Bathing: Shower ?Upper Body Dressing: Moderate assistance ?Where Assessed-Upper Body Dressing: Wheelchair ?Lower Body Dressing: Maximal assistance ?Where Assessed-Lower Body Dressing: Wheelchair ?Toileting: Maximal assistance ?Where Assessed-Toileting: Bed level ?Toilet Transfer: Dependent ?Toilet Transfer Method: Other (comment) (stedy) ?Toilet Transfer Equipment: Bedside commode ?Tub/Shower Transfer: Unable to assess ?Tub/Shower Transfer Method: Unable to assess ?Walk-In Shower Transfer: Minimal assistance (WITH USE OF STEDY) ?Walk-In Shower Transfer Method:  (STEDY) ?Walk-In Shower Equipment: Radio broadcast assistant ? ? ? ?Therapy/Group: Individual Therapy ? ?[facility] ?01/22/2022, 12:56 PM ?

## 2022-01-22 NOTE — Progress Notes (Signed)
Speech Language Pathology TBI Note ? ?Patient Details  ?Name: Cynthia [name] ?MRN: [id number] ?Date of Birth: 04-08-1946 ? ?Today's Date: 01/22/2022 ?SLP Individual Time: 1305-1405 ?SLP Individual Time Calculation (min): 60 min ? ?Short Term Goals: ?Week 1: SLP Short Term Goal 1 (Week 1): Pt will demonstrate recall of daily, novel information with use of internal/external aids given mod A verbal cues ?SLP Short Term Goal 2 (Week 1): Pt will demonstrate basic complex problem solving with min A verbal cues. ?SLP Short Term Goal 3 (Week 1): Pt will self-monitor and self-correct errors in functional task with mod A verbal cues. ?SLP Short Term Goal 4 (Week 1): Pt will demonstrate alternating attention within 10 minute intervals with mod A verbal cues. ? ?Skilled Therapeutic Interventions: Skilled treatment session focused on cognitive goals. Upon arrival, patient was upright in the wheelchair with her bedside table in front of her. Patient asked SLP why "she was chained to the table." SLP provided education regarding alarm belt as a safety precaution and also offered to move the table. Patient verbalized understanding and declined moving the table. SLP facilitated session by initially providing Max-Total A verbal and visual cues for problem solving and organization while patient attempting to count money. Patient was attempting to write the information down but would often count the same coins multiple times due to poor recall, attention and organization. Once patient was able to utilize visual cues by moving the coins as she counted them as well as counting the coins from the largest monetary amount to the smallest, patient was able to generate specific amounts of money as well as perform simple money management problems with extra time and Mod verbal cues. Patient requested to call her son so that "he can come pick her up." SLP provided education regarding that her son and come visit whenever he likes but she would  have to stay here. She verbalized understanding and was able to dial his number using the room phone with Min verbal cues. Patient left upright in wheelchair with alarm on and all needs within reach. Continue with current plan of care.  ?   ? ?Pain ?No/Denies Pain  ? ?Agitated Behavior Scale: ?TBI ?Observation Details ?Observation Environment: CIR ?Start of observation period - Date: 01/22/22 ?Start of observation period - Time: 1305 ?End of observation period - Date: 01/22/22 ?End of observation period - Time: 1405 ?Agitated Behavior Scale (DO NOT LEAVE BLANKS) ?Short attention span, easy distractibility, inability to concentrate: Present to a slight degree ?Impulsive, impatient, low tolerance for pain or frustration: Absent ?Uncooperative, resistant to care, demanding: Absent ?Violent and/or threatening violence toward people or property: Absent ?Explosive and/or unpredictable anger: Absent ?Rocking, rubbing, moaning, or other self-stimulating behavior: Absent ?Pulling at tubes, restraints, etc.: Absent ?Wandering from treatment areas: Absent ?Restlessness, pacing, excessive movement: Absent ?Repetitive behaviors, motor, and/or verbal: Absent ?Rapid, loud, or excessive talking: Absent ?Sudden changes of mood: Absent ?Easily initiated or excessive crying and/or laughter: Absent ?Self-abusiveness, physical and/or verbal: Absent ?Agitated behavior scale total score: 15 ? ?Therapy/Group: Individual Therapy ? ?Danyelle Brookover ?01/22/2022, 2:50 PM ?

## 2022-01-22 NOTE — Progress Notes (Addendum)
?                                                       PROGRESS NOTE ? ? ?Subjective/Complaints: ?Reports she had a good night last night. No new concerns today. ? ?Review of Systems  ?Constitutional:  Negative for chills and fever.  ?Respiratory:  Negative for shortness of breath.   ?Cardiovascular:  Negative for chest pain.  ?Gastrointestinal:  Negative for abdominal pain, constipation, diarrhea and nausea.  ?Musculoskeletal:  Negative for back pain.  ?Neurological:  Negative for headaches.  ?Psychiatric/Behavioral:  Negative for depression.    ? ?Objective: ?  ?No results found. ?No results for input(s): WBC, HGB, HCT, PLT in the last 72 hours. ? ?No results for input(s): NA, K, CL, CO2, GLUCOSE, BUN, CREATININE, CALCIUM in the last 72 hours. ? ? ?Intake/Output Summary (Last 24 hours) at 01/22/2022 0901 ?Last data filed at 01/22/2022 0751 ?Gross per 24 hour  ?Intake 237 ml  ?Output --  ?Net 237 ml  ? ?  ? ?  ? ?Physical Exam: ?Vital Signs ?Blood pressure (!) 143/71, pulse 86, temperature 98.4 ?F (36.9 ?C), resp. rate 18, height '5\' 6"'$  (1.676 m), weight 61.4 kg, SpO2 97 %. ? ? ? ?Assessment/Plan: ?1. Functional deficits which require 3+ hours per day of interdisciplinary therapy in a comprehensive inpatient rehab setting. ?Physiatrist is providing close team supervision and 24 hour management of active medical problems listed below. ?Physiatrist and rehab team continue to assess barriers to discharge/monitor patient progress toward functional and medical goals ? ?Care Tool: ? ?Bathing ?   ?Body parts bathed by patient: Right arm, Left arm, Chest, Abdomen, Front perineal area, Right upper leg, Left upper leg, Face  ? Body parts bathed by helper: Right lower leg, Left lower leg, Buttocks ?  ?  ?Bathing assist Assist Level: Moderate Assistance - Patient 50 - 74% ?  ?  ?Upper Body Dressing/Undressing ?Upper body dressing   ?What is the patient wearing?: Pull over shirt ?   ?Upper body assist Assist Level: Maximal  Assistance - Patient 25 - 49% ?   ?Lower Body Dressing/Undressing ?Lower body dressing ? ? ?   ?What is the patient wearing?: Pants ? ?  ? ?Lower body assist Assist for lower body dressing: Total Assistance - Patient < 25% ?   ? ?Toileting ?Toileting    ?Toileting assist Assist for toileting: Total Assistance - Patient < 25% ?  ?  ?Transfers ?Chair/bed transfer ? ?Transfers assist ?   ? ?Chair/bed transfer assist level: Maximal Assistance - Patient 25 - 49% ?  ?  ?Locomotion ?Ambulation ? ? ?Ambulation assist ? ?   ? ?Assist level: 2 helpers ?Assistive device: Hand held assist ?Max distance: 5  ? ?Walk 10 feet activity ? ? ?Assist ? Walk 10 feet activity did not occur: Safety/medical concerns ? ?  ?   ? ?Walk 50 feet activity ? ? ?Assist Walk 50 feet with 2 turns activity did not occur: Safety/medical concerns ? ?  ?   ? ? ?Walk 150 feet activity ? ? ?Assist Walk 150 feet activity did not occur: Safety/medical concerns ? ?  ?  ?  ? ?Walk 10 feet on uneven surface  ?activity ? ? ?Assist Walk 10 feet on uneven surfaces activity did not occur: Safety/medical concerns ? ? ?  ?   ? ?  Wheelchair ? ? ? ? ?Assist Is the patient using a wheelchair?: Yes ?Type of Wheelchair: Manual ?  ? ?Wheelchair assist level: Total Assistance - Patient < 25% ?Max wheelchair distance: 150  ? ? ?Wheelchair 50 feet with 2 turns activity ? ? ? ?Assist ? ?  ?  ? ? ?Assist Level: Total Assistance - Patient < 25%  ? ?Wheelchair 150 feet activity  ? ? ? ?Assist ?   ? ? ?Assist Level: Total Assistance - Patient < 25%  ? ?Blood pressure (!) 143/71, pulse 86, temperature 98.4 ?F (36.9 ?C), resp. rate 18, height '5\' 6"'$  (1.676 m), weight 61.4 kg, SpO2 97 %. ? ? ? ?General: Alert, No apparent distress. IN bed ?HEENT: Head is normocephalic,  PERRLA, EOMI, sclera anicteric, oral mucosa pink and moist ?Neck: Supple  ?Heart: Reg rate and rhythm. No murmurs rubs or gallops ?Chest: CTA bilaterally without wheezes, rales, or rhonchi; Non labored ?Abdomen: Soft,  non-tender, non-distended, bowel sounds positive. ?Extremities: No clubbing, cyanosis, or edema. ?Psych: Pt's affect is appropriate. Pt is cooperative ?Skin: Clean and intact without signs of breakdown ?Neuro:  Alert. Some neglect of left side, appears a little improved. Follows commands. ?CN 2-12 intact other than possibly decreased shrug on Left ?Musculoskeletal: 5/5 RUE and RLE ?3-4-/5 in LUE ?L HF 4-/5, Knee extension 4/5, Ankle FP/DF 3/5 ?RUE bruising  ?No ataxia or dysmetria noted ? ?Medical Problem List and Plan: ?1. Functional deficits secondary to intraparenchymal hematoma in the right rontal lobe ?            -patient may  shower ?            -ELOS/Goals: 12-14 days supervision ? -Completed gait training for 30 ft ?            -Continue CIR for PT, OT and SLP ? -Max A/Mod A for PT tasks today ?2.  Antithrombotics: ?-DVT/anticoagulation:  Mechanical: Antiembolism stockings, thigh (TED hose) Bilateral lower extremities ?            No Lovenoxetc due to IPH ?            -antiplatelet therapy: N/A ?3. Pain Management: ?As needed ?4. Mood: Effexor 75 mg daily ?            -antipsychotic agents: N/A ? -5/11 Good mood today, continue effexor '75mg'$  ? -5/12 mood continues to be good overall, continue to follow ?5. Neuropsych: This patient is capable of making decisions on her own behalf. ?6. Skin/Wound Care: Routine skin check ?7. Fluids/Electrolytes/Nutrition: Routine in and out with follow-up chemistries ? -5/11 midline removed yesterday ?8.  Seizure disorder- new onset since IPH-.  Keppra 1000 mg every 12 hours ?-Denies any new epileptic events, continue keppra ?9.  Hypertension.  Cozaar 100 mg daily.  Monitor with increased mobility ? -5/13 BP a little higher today but overall well controlled, Continue to monitor ?10.  History of left breast cancer with radioactive seed localization 08/28/2020.  Follow-up outpatient ?11. Hx of R frontal ICH 2017 ?12. Poor appetite- might need supplements or appetite  stimulants ? -5/10 albumin a little low, encourage PO intake ? -5/13Continue to encourage PO intake, 100 percent for breakfast today ? ?LOS: ?3 days ?A FACE TO FACE EVALUATION WAS PERFORMED ? ?Jennye Boroughs ?01/22/2022, 9:01 AM  ? ?  ?

## 2022-01-23 DIAGNOSIS — R197 Diarrhea, unspecified: Secondary | ICD-10-CM

## 2022-01-23 DIAGNOSIS — F32A Depression, unspecified: Secondary | ICD-10-CM

## 2022-01-23 NOTE — Progress Notes (Signed)
?                                                       PROGRESS NOTE ? ? ?Subjective/Complaints: ?No new complaints. Reports she had some loose Bms but this has resolved.  ? ?Review of Systems  ?Constitutional:  Negative for chills and fever.  ?Respiratory:  Negative for cough and shortness of breath.   ?Cardiovascular:  Negative for chest pain.  ?Gastrointestinal:  Negative for abdominal pain, constipation and nausea.  ?Musculoskeletal:  Negative for neck pain.  ?Neurological:  Negative for headaches.  ?Psychiatric/Behavioral:  Negative for depression.    ? ?Objective: ?  ?No results found. ?No results for input(s): WBC, HGB, HCT, PLT in the last 72 hours. ? ?No results for input(s): NA, K, CL, CO2, GLUCOSE, BUN, CREATININE, CALCIUM in the last 72 hours. ? ? ?Intake/Output Summary (Last 24 hours) at 01/23/2022 0739 ?Last data filed at 01/22/2022 1325 ?Gross per 24 hour  ?Intake 220 ml  ?Output --  ?Net 220 ml  ? ?  ? ?  ? ?Physical Exam: ?Vital Signs ?Blood pressure 132/68, pulse 80, temperature 98 ?F (36.7 ?C), resp. rate 16, height '5\' 6"'$  (1.676 m), weight 61.4 kg, SpO2 97 %. ? ? ? ?Assessment/Plan: ?1. Functional deficits which require 3+ hours per day of interdisciplinary therapy in a comprehensive inpatient rehab setting. ?Physiatrist is providing close team supervision and 24 hour management of active medical problems listed below. ?Physiatrist and rehab team continue to assess barriers to discharge/monitor patient progress toward functional and medical goals ? ?Care Tool: ? ?Bathing ?   ?Body parts bathed by patient: Right arm, Left arm, Chest, Abdomen, Front perineal area, Right upper leg, Left upper leg, Face  ? Body parts bathed by helper: Right lower leg, Left lower leg, Buttocks ?  ?  ?Bathing assist Assist Level: Moderate Assistance - Patient 50 - 74% ?  ?  ?Upper Body Dressing/Undressing ?Upper body dressing   ?What is the patient wearing?: Pull over shirt ?   ?Upper body assist Assist Level: Maximal  Assistance - Patient 25 - 49% ?   ?Lower Body Dressing/Undressing ?Lower body dressing ? ? ?   ?What is the patient wearing?: Pants ? ?  ? ?Lower body assist Assist for lower body dressing: Total Assistance - Patient < 25% ?   ? ?Toileting ?Toileting    ?Toileting assist Assist for toileting: Total Assistance - Patient < 25% ?  ?  ?Transfers ?Chair/bed transfer ? ?Transfers assist ?   ? ?Chair/bed transfer assist level: Maximal Assistance - Patient 25 - 49% ?  ?  ?Locomotion ?Ambulation ? ? ?Ambulation assist ? ?   ? ?Assist level: 2 helpers ?Assistive device: Hand held assist ?Max distance: 5  ? ?Walk 10 feet activity ? ? ?Assist ? Walk 10 feet activity did not occur: Safety/medical concerns ? ?  ?   ? ?Walk 50 feet activity ? ? ?Assist Walk 50 feet with 2 turns activity did not occur: Safety/medical concerns ? ?  ?   ? ? ?Walk 150 feet activity ? ? ?Assist Walk 150 feet activity did not occur: Safety/medical concerns ? ?  ?  ?  ? ?Walk 10 feet on uneven surface  ?activity ? ? ?Assist Walk 10 feet on uneven surfaces activity did not occur: Safety/medical concerns ? ? ?  ?   ? ?  Wheelchair ? ? ? ? ?Assist Is the patient using a wheelchair?: Yes ?Type of Wheelchair: Manual ?  ? ?Wheelchair assist level: Total Assistance - Patient < 25% ?Max wheelchair distance: 150  ? ? ?Wheelchair 50 feet with 2 turns activity ? ? ? ?Assist ? ?  ?  ? ? ?Assist Level: Total Assistance - Patient < 25%  ? ?Wheelchair 150 feet activity  ? ? ? ?Assist ?   ? ? ?Assist Level: Total Assistance - Patient < 25%  ? ?Blood pressure 132/68, pulse 80, temperature 98 ?F (36.7 ?C), resp. rate 16, height '5\' 6"'$  (1.676 m), weight 61.4 kg, SpO2 97 %. ? ? ? ?General: Alert, No apparent distress. IN bed ?HEENT: Head is normocephalic,  PERRLA, EOMI, sclera anicteric, oral mucosa pink and moist ?Neck: Supple  ?Heart: Reg rate and rhythm. No murmurs rubs or gallops ?Chest: CTA bilaterally without wheezes, rales, or rhonchi; Non labored ?Abdomen: Soft,  non-tender, non-distended, bowel sounds positive. ?Extremities: No clubbing, cyanosis, or edema. ?Psych: Pt's affect is appropriate. Pt is cooperative ?Skin: warm and dry ?Neuro:  Alert. Some neglect of left side, appears a little improved. Follows commands. ?CN 2-12 intact  ?Musculoskeletal: 5/5 RUE and RLE ?3-4-/5 in LUE ?L HF 4-/5, Knee extension 4/5, Ankle FP/DF 3/5 ?No ataxia or dysmetria noted ? ?Medical Problem List and Plan: ?1. Functional deficits secondary to intraparenchymal hematoma in the right rontal lobe ?            -patient may  shower ?            -ELOS/Goals: 12-14 days supervision ? -Completed gait training for 30 ft ?            -Continue CIR for PT, OT and SLP ? -With OT needing more help with open chain type activities ?2.  Antithrombotics: ?-DVT/anticoagulation:  Mechanical: Antiembolism stockings, thigh (TED hose) Bilateral lower extremities ?            No Lovenoxetc due to IPH ?            -antiplatelet therapy: N/A ?3. Pain Management: ?As needed ?4. Mood: Effexor 75 mg daily ?            -antipsychotic agents: N/A ? -5/11 Good mood today, continue effexor '75mg'$  ? -5/13 Mood reported to be good overall ?5. Neuropsych: This patient is capable of making decisions on her own behalf. ?6. Skin/Wound Care: Routine skin check ?7. Fluids/Electrolytes/Nutrition: Routine in and out with follow-up chemistries ? -5/11 midline removed yesterday ?8.  Seizure disorder- new onset since IPH-.  Keppra 1000 mg every 12 hours ?-Denies any new epileptic events, continue keppra ?9.  Hypertension.  Cozaar 100 mg daily.  Monitor with increased mobility ? -5/14 BP well controlled this AM, continue cozaar ?10.  History of left breast cancer with radioactive seed localization 08/28/2020.  Follow-up outpatient ?11. Hx of R frontal ICH 2017 ?12. Poor appetite- might need supplements or appetite stimulants ? -5/10 albumin a little low, encourage PO intake ? -5/14 Appears to be eating well overall ?13. Diarrhea  ? -5/14  no Bms in last 24h, not on any laxities, follow ? ?LOS: ?4 days ?A FACE TO FACE EVALUATION WAS PERFORMED ? ?Jennye Boroughs ?01/23/2022, 7:39 AM  ? ?  ?

## 2022-01-24 ENCOUNTER — Inpatient Hospital Stay (HOSPITAL_COMMUNITY): Payer: Medicare PPO

## 2022-01-24 LAB — GLUCOSE, CAPILLARY: Glucose-Capillary: 91 mg/dL (ref 70–99)

## 2022-01-24 MED ORDER — ACETAMINOPHEN 325 MG PO TABS
650.0000 mg | ORAL_TABLET | Freq: Four times a day (QID) | ORAL | Status: DC | PRN
  Administered 2022-01-24: 650 mg via ORAL
  Filled 2022-01-24: qty 2

## 2022-01-24 NOTE — Progress Notes (Signed)
Occupational Therapy Session Note ? ?Patient Details  ?Name: Cynthia [name] ?MRN: [id number] ?Date of Birth: Dec 02, 1945 ? ?Today's Date: 01/24/2022 ?OT Individual Time: 1300-1403 ?OT Individual Time Calculation (min): 63 min  ? ? ?Short Term Goals: ?Week 1:  OT Short Term Goal 1 (Week 1): Pt will complete toilet transfer with max A of 1 to [facility] to promote OOB toileting ?OT Short Term Goal 2 (Week 1): Pt will complete LB dressing mod A at the sit > stand level with AE PRN ?OT Short Term Goal 3 (Week 1): Pt will maintain dynamic sitting balance with supervision during ADL task ? ?Skilled Therapeutic Interventions/Progress Updates:  ?  NM re-ed completed during session focusing on functional transfers, visual processing, safety/judgement, activity sequencing, and balance in order to increase functional performance during basic ADL tasks.  ?- Functional transfers: to and from mat table from wheelchair completed at mod assist (max assist initially to stand and maintain balance) while standing on pt's left side due to left lateral lean. Max VC required for sequencing and technique. Physical assist provided for weight shift, intermittently provided tactile cueing as well.  ?- Proximal shoulder strengthening: seated on EOM, NDT used to facilitate dorsi flexion of the left foot and to maintain contact with the floor during seated activities. VC and visual cues used consistently.   ?- Initially used red therapy ball then transitioning to small beach ball for just right challenge and to allow for better form and technique. Chest press, flexion, overhead press completed, 10X  ?with rest breaks needed due to UE fatigue.  ?- Fine Motor Coordination: Card flip, card sorting by color, placing cards away in bag once finished. Required consistent verbal and visual cues to follow directions. Pt was unable to keep sorted card piles neat and would frequently place a card next to correct pile versus on top.  ?  ? ?Therapy  Documentation ?Precautions:  ?Precautions ?Precautions: Fall ?Precaution Comments: L LE extensor tone ?Restrictions ?Weight Bearing Restrictions: No ?Other Position/Activity Restrictions: tendency to stand on toes on L foot due to extension tone/L ankle contracture ? ?Pain: ?Pain Assessment ?Pain Score: 0-No pain ? ? ?Therapy/Group: Individual Therapy ? ?Ailene Ravel, OTR/L,CBIS  ?[phone] ? ?01/24/2022, 3:41 PM ?

## 2022-01-24 NOTE — Progress Notes (Deleted)
Speech Language Pathology Daily Session Note ? ?Patient Details  ?Name: Cynthia [name] ?MRN: [id number] ?Date of Birth: [birth year] ? ?Today's Date: 01/24/2022 ?SLP Individual Time: 6962-9528 ?SLP Individual Time Calculation (min): 58 min ? ?Short Term Goals: ?Week 1: SLP Short Term Goal 1 (Week 1): Pt will demonstrate recall of daily, novel information with use of internal/external aids given mod A verbal cues ?SLP Short Term Goal 2 (Week 1): Pt will demonstrate basic complex problem solving with min A verbal cues. ?SLP Short Term Goal 3 (Week 1): Pt will self-monitor and self-correct errors in functional task with mod A verbal cues. ?SLP Short Term Goal 4 (Week 1): Pt will demonstrate alternating attention within 10 minute intervals with mod A verbal cues. ? ?Skilled Therapeutic Interventions:Skilled ST services focused on cognitive skills. Pt appeared to be attempting to get out of bed when SLP arrived (moving blanket and began swinging legs to edge of bed.) Pt expressed desire to go to the bathroom but when SLP questioned safety pt demonstrated recall of call bell and protocol mod I. SLP transferred pt be via stedy, pt was continent of urine. Pt required supervision A verbal cues for problem solving during toileting/transfer and min A verbal cues during oral care at the sink with mod A verbal cues for left lean. SLP started a memory notebook to aid in recall, pt required min A verbal cues to recall session events. SLP also facilitated mildly complex problem solving and error awareness in 4 step picture sequencing task, pt required max A verbal cues fading to min A once broken down into two step options. SLP provided instruction to sequence steps aloud during OT/PT tasks to aid in recall and problem solving. Pt was left in room with call bell within reach and bed alarm set. SLP recommends to continue skilled services. ? ?   ? ?Pain ?Pain Assessment ?Pain Score: 0-No pain ? ?Therapy/Group: Individual  Therapy ? ?Sultan Pargas ?01/24/2022, 2:05 PM ?

## 2022-01-24 NOTE — Progress Notes (Signed)
Speech Language Pathology TBI Note ? ?Patient Details  ?Name: Cynthia [name] ?MRN: [id number] ?Date of Birth: 1946/06/13 ? ?Today's Date: 01/24/2022 ?SLP Individual Time: 2694-8546 ?SLP Individual Time Calculation (min): 58 min ? ?Short Term Goals: ?Week 1: SLP Short Term Goal 1 (Week 1): Pt will demonstrate recall of daily, novel information with use of internal/external aids given mod A verbal cues ?SLP Short Term Goal 2 (Week 1): Pt will demonstrate basic complex problem solving with min A verbal cues. ?SLP Short Term Goal 3 (Week 1): Pt will self-monitor and self-correct errors in functional task with mod A verbal cues. ?SLP Short Term Goal 4 (Week 1): Pt will demonstrate alternating attention within 10 minute intervals with mod A verbal cues. ? ?Skilled Therapeutic Interventions:Skilled ST services focused on cognitive skills. Pt appeared to be attempting to get out of bed when SLP arrived (moving blanket and began swinging legs to edge of bed.) Pt expressed desire to go to the bathroom but when SLP questioned safety pt demonstrated recall of call bell and protocol mod I. SLP transferred pt be via stedy, pt was continent of urine. Pt required supervision A verbal cues for problem solving during toileting/transfer and min A verbal cues during oral care at the sink with mod A verbal cues for left lean. SLP started a memory notebook to aid in recall, pt required min A verbal cues to recall session events. SLP also facilitated mildly complex problem solving and error awareness in 4 step picture sequencing task, pt required max A verbal cues fading to min A once broken down into two step options. SLP provided instruction to sequence steps aloud during OT/PT tasks to aid in recall and problem solving. Pt was left in room with call bell within reach and bed alarm set. SLP recommends to continue skilled services. ? ?   ?   ? ?Pain ?Pain Assessment ?Pain Score: 0-No pain ? ?Agitated Behavior Scale: ?TBI ?Observation  Details ?Observation Environment: CIR ?Start of observation period - Date: 01/24/22 ?Start of observation period - Time: 0945 ?End of observation period - Date: 01/24/22 ?End of observation period - Time: 1045 ?Agitated Behavior Scale (DO NOT LEAVE BLANKS) ?Short attention span, easy distractibility, inability to concentrate: Present to a moderate degree ?Impulsive, impatient, low tolerance for pain or frustration: Present to a slight degree ?Uncooperative, resistant to care, demanding: Absent ?Violent and/or threatening violence toward people or property: Absent ?Explosive and/or unpredictable anger: Absent ?Rocking, rubbing, moaning, or other self-stimulating behavior: Absent ?Pulling at tubes, restraints, etc.: Absent ?Wandering from treatment areas: Absent ?Restlessness, pacing, excessive movement: Absent ?Repetitive behaviors, motor, and/or verbal: Absent ?Rapid, loud, or excessive talking: Absent ?Sudden changes of mood: Absent ?Easily initiated or excessive crying and/or laughter: Absent ?Self-abusiveness, physical and/or verbal: Absent ?Agitated behavior scale total score: 17 ? ?Therapy/Group: Individual Therapy ? ?Terricka Onofrio ?01/24/2022, 2:15 PM ?

## 2022-01-24 NOTE — Progress Notes (Signed)
Patient found on floor by dietary during shift change. Patient lying in front of wheel chair with wheel chair in place and chair alarm still intact. Patient states "my wheel chair tipped over". C/o pain to R side of head, on call PA Risa Grill notified and CT of head was ordered. Post fall protocol initiated. Have continued with plan of care. Patient in bed with bed alarm on and call bell within reach. ? ? ? 01/24/22 1921  ?What Happened  ?Was fall witnessed? No  ?Was patient injured? Yes  ?Patient found on floor  ?Found by Staff-comment ?(dietary)  ?Stated prior activity to/from bed, chair, or stretcher  ?Follow Up  ?MD notified Risa Grill  ?Time MD notified 1935  ?Family notified Yes - comment  ?Time family notified 21  ?Additional tests Yes-comment  ?Simple treatment Ice  ?Adult Fall Risk Assessment  ?Risk Factor Category (scoring not indicated) Fall has occurred during this admission (document High fall risk);High fall risk per protocol (document High fall risk);History of more than one fall within 6 months before admission (document High fall risk)  ?Age 76  ?Fall History: Fall within 6 months prior to admission 5  ?Elimination; Bowel and/or Urine Incontinence 2  ?Elimination; Bowel and/or Urine Urgency/Frequency 0  ?Medications: includes PCA/Opiates, Anti-convulsants, Anti-hypertensives, Diuretics, Hypnotics, Laxatives, Sedatives, and Psychotropics 3  ?Patient Care Equipment 0  ?Mobility-Assistance 2  ?Mobility-Gait 2  ?Mobility-Sensory Deficit 0  ?Altered awareness of immediate physical environment 0  ?Impulsiveness 0  ?Lack of understanding of one's physical/cognitive limitations 0  ?Total Score 16  ?Patient Fall Risk Level High fall risk  ?Adult Fall Risk Interventions  ?Required Bundle Interventions *See Row Information* High fall risk - low, moderate, and high requirements implemented  ?Additional Interventions Use of appropriate toileting equipment (bedpan, BSC, etc.)  ?Screening for Fall Injury  Risk (To be completed on HIGH fall risk patients) - Assessing Need for Floor Mats  ?Risk For Fall Injury- Criteria for Floor Mats Previous fall this admission  ?Will Implement Floor Mats Yes  ?Vitals  ?Temp 98.5 ?F (36.9 ?C)  ?Temp Source Oral  ?BP 118/72  ?MAP (mmHg) 87  ?BP Location Right Arm  ?BP Method Automatic  ?Patient Position (if appropriate) Lying  ?Pulse Rate 93  ?Resp 17  ?Oxygen Therapy  ?SpO2 96 %  ?O2 Device Room Air  ?Neurological  ?Neuro (WDL) X  ?Level of Consciousness Alert  ?Orientation Level Oriented to person;Oriented to place;Oriented to situation  ?Cognition Follows commands  ?Speech Clear  ?R Pupil Size (mm) 3  ?R Pupil Shape Round  ?R Pupil Reaction Brisk  ?L Pupil Size (mm) 3  ?L Pupil Shape Round  ?L Pupil Reaction Brisk  ?Motor Function/Sensation Assessment Motor response  ?RUE Motor Response Purposeful movement  ?RUE Sensation Full sensation  ?RUE Motor Strength 4  ?LUE Motor Response Purposeful movement  ?LUE Sensation Decreased  ?LUE Motor Strength 3  ?RLE Motor Response Purposeful movement  ?RLE Sensation Full sensation  ?RLE Motor Strength 4  ?LLE Motor Response Purposeful movement  ?LLE Sensation No sensation (absent)  ?LLE Motor Strength 3  ?Neuro Symptoms None  ? ?

## 2022-01-24 NOTE — Progress Notes (Signed)
Physical Therapy Session Note ? ?Patient Details  ?Name: Cynthia [name] ?MRN: [id number] ?Date of Birth: September 10, 1946 ? ?Today's Date: 01/24/2022 ?PT Individual Time: 1045-1200 ?PT Individual Time Calculation (min): 75 min  ? ?Short Term Goals: ?Week 1:  PT Short Term Goal 1 (Week 1): Patient will complete supine <> sit with MinA consistently ?PT Short Term Goal 2 (Week 1): Patient will complete sit <> stand with LRAD and ModA consistently ?PT Short Term Goal 3 (Week 1): Patient will ambulate at least 11f with LRAD and ModA + wc follow as needed ?PT Short Term Goal 4 (Week 1): Patient will initiate stair training ? ?Skilled Therapeutic Interventions/Progress Updates:  ?  Patient received sitting up in bed, hand off from SLP. Agreeable to PT and she denies pain. Patient coming to sit edge of bed with MinA but MaxA verbal cues for sequencing. Intermittent assist and verbal cues for maintaining static posture edge of bed. PT threading up pants and donning shoes TotalA for time. She came [place] due to fluctuating tone. Stedy transfer to wc for time management. PT transporting patient in wc to therapy gym for time management and energy conservation. Speech therapy reporting strategy for attention to task of stating the steps of the task she she's doing them. She was able to state the steps of coming to stand with mirror used for visual feedback on posture/ limiting L lateral lean. Patient with very poor dual tasking ability and was unable to state the steps as she completed them resulting in increased physical assist needed for completing task. In standing, patient weight bearing on L ball of her foot with toes hyperextended and ankle plantarflexed. With increased weight bearing, patients L knee simply flexing, her L heel was not able to make full contact with the floor. She ambulated [id number] with MaxA HHA. PT providing visual feedback on midline posture. Patient  unable to complete dual task of maintaining upright/midline posture + advancing/placing L LE appropriately resulting in her gait pattern deteriorating. L hip excessively posteriorly rotated contributing to L LE lagging behind with further gait distance. Patient completing standing balance task reaching to the right to manipulate cones. Patient requiring up to [place] postural support and frequent verbal cues to shift weight R to reach cones. PT instructing patient on wc mobility using B UE and verbal cue for L UE "let go, reach back, push forward." Patient able to recite the verbal cue, but was unable to complete motor task while reciting the cue. Ultimately MaxA/TotalA for wc mobility using B UE. Patient may benefit from use of TIS wc as when she's seated in her standard wc and completing a dual task (I.e., eating) she has progressive L lateral lean over the L armrest of the wc. PT unable to find L armrest for only remaining TIS wc at this time. She remained up in wc, seatbelt alarm on, call light within reach, laptray on L to assist in maintaining neutral posture.  ? ?Therapy Documentation ?Precautions:  ?Precautions ?Precautions: Fall ?Precaution Comments: L LE extensor tone ?Restrictions ?Weight Bearing Restrictions: No ?Other Position/Activity Restrictions: tendency to stand on toes on L foot due to extension tone/L ankle contracture ? ? ? ?Therapy/Group: Individual Therapy ? ?JeDebbora DusJeDebbora DusPT, DPT, CBIS ? ?01/24/2022, 7:51 AM  ?

## 2022-01-25 DIAGNOSIS — M79603 Pain in arm, unspecified: Secondary | ICD-10-CM

## 2022-01-25 LAB — CBC
HCT: 34 % — ABNORMAL LOW (ref 36.0–46.0)
Hemoglobin: 11.7 g/dL — ABNORMAL LOW (ref 12.0–15.0)
MCH: 30.6 pg (ref 26.0–34.0)
MCHC: 34.4 g/dL (ref 30.0–36.0)
MCV: 89 fL (ref 80.0–100.0)
Platelets: 321 10*3/uL (ref 150–400)
RBC: 3.82 MIL/uL — ABNORMAL LOW (ref 3.87–5.11)
RDW: 12.4 % (ref 11.5–15.5)
WBC: 7.4 10*3/uL (ref 4.0–10.5)
nRBC: 0 % (ref 0.0–0.2)

## 2022-01-25 LAB — BASIC METABOLIC PANEL
Anion gap: 5 (ref 5–15)
BUN: 16 mg/dL (ref 8–23)
CO2: 30 mmol/L (ref 22–32)
Calcium: 8.9 mg/dL (ref 8.9–10.3)
Chloride: 105 mmol/L (ref 98–111)
Creatinine, Ser: 0.73 mg/dL (ref 0.44–1.00)
GFR, Estimated: >60 mL/min (ref >60)
Glucose, Bld: 90 mg/dL (ref 70–99)
Potassium: 3.9 mmol/L (ref 3.5–5.1)
Sodium: 140 mmol/L (ref 135–145)

## 2022-01-25 LAB — PREALBUMIN: Prealbumin: 20.8 mg/dL (ref 18–38)

## 2022-01-25 NOTE — Progress Notes (Signed)
Notified by assigned nurses of patient's fall ,order received  from on-call medical staff for Tele- monitoring (safety)and requested. Writer was also informed that family had been notified and made aware of s/p fall.Tele-sitter monitoring requested ?

## 2022-01-25 NOTE — Progress Notes (Signed)
Speech Language Pathology TBI Note ? ?Patient Details  ?Name: Cynthia [name] ?MRN: [id number] ?Date of Birth: [birth year] ? ?Today's Date: 01/25/2022 ?SLP Individual Time: 1001-1100 ?SLP Individual Time Calculation (min): 59 min ? ?Short Term Goals: ?Week 1: SLP Short Term Goal 1 (Week 1): Pt will demonstrate recall of daily, novel information with use of internal/external aids given mod A verbal cues ?SLP Short Term Goal 2 (Week 1): Pt will demonstrate basic complex problem solving with min A verbal cues. ?SLP Short Term Goal 3 (Week 1): Pt will self-monitor and self-correct errors in functional task with mod A verbal cues. ?SLP Short Term Goal 4 (Week 1): Pt will demonstrate alternating attention within 10 minute intervals with mod A verbal cues. ? ?Skilled Therapeutic Interventions: ?Pt seen for skilled ST with focus on cognitive goals, sitting upright in wheelchair and agreeable to therapeutic tasks. Staff reports fall previous evening and pt was able to recall that she fell without prompts. Pt reports she was trying to get to something in her room but "couldn't get to it" resulting in fall from wheelchair. SLP facilitating problem solving/safety awareness task for current environment by providing mod A cues to increase insight and recall into current deficits. Pt discusses history of being "independent and not wanting people to see me down", including delaying getting help with both of previous CVAs. SLP providing extensive education regarding problem ID/solving, fall prevention and safety awareness for home environment, pt was agreeable to information but will need further training/education for carryover. During simple sequencing task of 4-pictures, pt requires max A verbal cues to understand directions and for error awareness. Pt would mostly keep pictures in order they were received and describe pictures/create narrative for current out of order sequence. With cues pt writing information down in memory  notebook including questions she has for her MD, independent for recall of "Dr. Marciano Sequin". Pt left in wheelchair with alarm belt activated and encouraged to utilize call button for all needs. Cont ST POC.  ? ?Pain ?Pain Assessment ?Pain Scale: 0-10 ?Pain Score: 0-No pain ? ?Agitated Behavior Scale: ?TBI ?Observation Details ?Observation Environment: CIR ?Start of observation period - Date: 01/25/22 ?Start of observation period - Time: 1000 ?End of observation period - Date: 01/25/22 ?End of observation period - Time: 1100 ?Agitated Behavior Scale (DO NOT LEAVE BLANKS) ?Short attention span, easy distractibility, inability to concentrate: Present to a slight degree ?Impulsive, impatient, low tolerance for pain or frustration: Absent ?Uncooperative, resistant to care, demanding: Absent ?Violent and/or threatening violence toward people or property: Absent ?Explosive and/or unpredictable anger: Absent ?Rocking, rubbing, moaning, or other self-stimulating behavior: Absent ?Pulling at tubes, restraints, etc.: Absent ?Wandering from treatment areas: Absent ?Restlessness, pacing, excessive movement: Absent ?Repetitive behaviors, motor, and/or verbal: Absent ?Rapid, loud, or excessive talking: Absent ?Sudden changes of mood: Absent ?Easily initiated or excessive crying and/or laughter: Absent ?Self-abusiveness, physical and/or verbal: Absent ?Agitated behavior scale total score: 15 ? ?Therapy/Group: Individual Therapy ? ?[name] ?01/25/2022, 11:03 AM ?

## 2022-01-25 NOTE — Progress Notes (Signed)
?                                                       PROGRESS NOTE ? ? ?Subjective/Complaints: ?Fall yesterday and hit her head when "WC tipped over". She feels well today and has no other concerns.  ? ?Review of Systems  ?Constitutional:  Negative for chills and fever.  ?Respiratory:  Negative for cough and shortness of breath.   ?Cardiovascular:  Negative for chest pain.  ?Gastrointestinal:  Negative for abdominal pain, constipation and nausea.  ?Musculoskeletal:  Negative for neck pain.  ?Neurological:  Negative for headaches.  ?Psychiatric/Behavioral:  Negative for depression.    ? ?Objective: ?  ?CT HEAD WO CONTRAST (5MM) ? ?Result Date: 01/24/2022 ?CLINICAL DATA:  Known right frontal intraparenchymal hematoma, fell and hit head this evening EXAM: CT HEAD WITHOUT CONTRAST TECHNIQUE: Contiguous axial images were obtained from the base of the skull through the vertex without intravenous contrast. RADIATION DOSE REDUCTION: This exam was performed according to the departmental dose-optimization program which includes automated exposure control, adjustment of the mA and/or kV according to patient size and/or use of iterative reconstruction technique. COMPARISON:  01/11/2022, 01/13/2022 FINDINGS: Brain: Decreased prominence of the right frontal pericallosal intraparenchymal hematoma and associated subarachnoid hemorrhage seen previously, with the intraparenchymal hemorrhage now measuring 1.9 x 1.3 x 2.1 cm, where previously this had measured approximately 3.2 x 2.4 by 3.2 cm. There is persistent but decreasing vasogenic edema within the right frontal lobe. No new areas of infarct or hemorrhage. The lateral ventricles and remaining midline structures are stable, with extensive chronic small-vessel ischemic changes seen elsewhere throughout the periventricular white matter. No mass effect or midline shift. Vascular: No hyperdense vessel or unexpected calcification. Skull: Normal. Negative for fracture or focal  lesion. Sinuses/Orbits: No acute finding. Other: None. IMPRESSION: 1. Improving right frontal pericallosal intraparenchymal hematoma and adjacent subarachnoid hemorrhage, with decreased surrounding vasogenic edema. No mass effect or midline shift. 2. No new infarct or hemorrhage. Electronically Signed   By: Randa Ngo M.D.   On: 01/24/2022 21:06   ?No results for input(s): WBC, HGB, HCT, PLT in the last 72 hours. ? ?No results for input(s): NA, K, CL, CO2, GLUCOSE, BUN, CREATININE, CALCIUM in the last 72 hours. ? ? ?Intake/Output Summary (Last 24 hours) at 01/25/2022 0737 ?Last data filed at 01/24/2022 1859 ?Gross per 24 hour  ?Intake 377 ml  ?Output --  ?Net 377 ml  ? ?  ? ?  ? ?Physical Exam: ?Vital Signs ?Blood pressure 125/66, pulse 85, temperature 98 ?F (36.7 ?C), resp. rate 18, height '5\' 6"'$  (1.676 m), weight 61.4 kg, SpO2 97 %. ? ? ? ?Assessment/Plan: ?1. Functional deficits which require 3+ hours per day of interdisciplinary therapy in a comprehensive inpatient rehab setting. ?Physiatrist is providing close team supervision and 24 hour management of active medical problems listed below. ?Physiatrist and rehab team continue to assess barriers to discharge/monitor patient progress toward functional and medical goals ? ?Care Tool: ? ?Bathing ?   ?Body parts bathed by patient: Right arm, Left arm, Chest, Abdomen, Front perineal area, Right upper leg, Left upper leg, Face  ? Body parts bathed by helper: Right lower leg, Left lower leg, Buttocks ?  ?  ?Bathing assist Assist Level: Moderate Assistance - Patient 50 - 74% ?  ?  ?  Upper Body Dressing/Undressing ?Upper body dressing   ?What is the patient wearing?: Pull over shirt ?   ?Upper body assist Assist Level: Maximal Assistance - Patient 25 - 49% ?   ?Lower Body Dressing/Undressing ?Lower body dressing ? ? ?   ?What is the patient wearing?: Pants ? ?  ? ?Lower body assist Assist for lower body dressing: Total Assistance - Patient < 25% ?    ? ?Toileting ?Toileting    ?Toileting assist Assist for toileting: Total Assistance - Patient < 25% ?  ?  ?Transfers ?Chair/bed transfer ? ?Transfers assist ?   ? ?Chair/bed transfer assist level: Maximal Assistance - Patient 25 - 49% ?  ?  ?Locomotion ?Ambulation ? ? ?Ambulation assist ? ?   ? ?Assist level: 2 helpers ?Assistive device: Hand held assist ?Max distance: 5  ? ?Walk 10 feet activity ? ? ?Assist ? Walk 10 feet activity did not occur: Safety/medical concerns ? ?  ?   ? ?Walk 50 feet activity ? ? ?Assist Walk 50 feet with 2 turns activity did not occur: Safety/medical concerns ? ?  ?   ? ? ?Walk 150 feet activity ? ? ?Assist Walk 150 feet activity did not occur: Safety/medical concerns ? ?  ?  ?  ? ?Walk 10 feet on uneven surface  ?activity ? ? ?Assist Walk 10 feet on uneven surfaces activity did not occur: Safety/medical concerns ? ? ?  ?   ? ?Wheelchair ? ? ? ? ?Assist Is the patient using a wheelchair?: Yes ?Type of Wheelchair: Manual ?  ? ?Wheelchair assist level: Total Assistance - Patient < 25% ?Max wheelchair distance: 150  ? ? ?Wheelchair 50 feet with 2 turns activity ? ? ? ?Assist ? ?  ?  ? ? ?Assist Level: Total Assistance - Patient < 25%  ? ?Wheelchair 150 feet activity  ? ? ? ?Assist ?   ? ? ?Assist Level: Total Assistance - Patient < 25%  ? ?Blood pressure 125/66, pulse 85, temperature 98 ?F (36.7 ?C), resp. rate 18, height '5\' 6"'$  (1.676 m), weight 61.4 kg, SpO2 97 %. ? ? ? ?General: Alert, No apparent distress. In Bed ?HEENT: Head is normocephalic,  PERRLA, EOMI, sclera anicteric, oral mucosa pink and moist ?Neck: Supple  ?Heart: Reg rate and rhythm.  ?Chest: CTA bilaterally without wheezes, rales, or rhonchi; Normal effort ?Abdomen: Soft, non-tender, non-distended, bowel sounds positive. ?Extremities: No clubbing, cyanosis, or edema. ?Psych: Pt's affect is appropriate. Pt is cooperative ?Skin: warm and dry ?Neuro:  Alert. Some neglect of left side, appears a little improved. Follows  commands. ?CN 2-12 intact  ?Musculoskeletal: 5/5 RUE and RLE ?3-4-/5 in LUE ?L HF 4-/5, Knee extension 4/5, Ankle FP/DF 3/5 ?No ataxia or dysmetria noted ? ?Medical Problem List and Plan: ?1. Functional deficits secondary to intraparenchymal hematoma in the right rontal lobe ?            -patient may  shower ?            -ELOS/Goals: 12-14 days supervision ? -Completed gait training for 30 ft ?            -Continue CIR for PT, OT and SLP ? -With OT needing more help with open chain type activities ? -CT head after fall 5/15 without acute changes, discussed fall prevention ?2.  Antithrombotics: ?-DVT/anticoagulation:  Mechanical: Antiembolism stockings, thigh (TED hose) Bilateral lower extremities ?            No Lovenoxetc due to  IPH ?            -antiplatelet therapy: N/A ?3. Pain Management: ?Pain well controlled today ?4. Mood: Effexor 75 mg daily ?            -antipsychotic agents: N/A ? -5/11 Good mood today, continue effexor '75mg'$  ? -5/13 Mood reported to be good overall ?5. Neuropsych: This patient is capable of making decisions on her own behalf. ?6. Skin/Wound Care: Routine skin check ?7. Fluids/Electrolytes/Nutrition: Routine in and out with follow-up chemistries ? -5/11 midline removed yesterday ?8.  Seizure disorder- new onset since IPH-.  Keppra 1000 mg every 12 hours ?-Denies any new epileptic events, continue keppra ?9.  Hypertension.  Cozaar 100 mg daily.  Monitor with increased mobility ? -5/16 BP well controlled, continue cozaar current dose ?10.  History of left breast cancer with radioactive seed localization 08/28/2020.  Follow-up outpatient ?11. Hx of R frontal ICH 2017 ?12. Poor appetite- might need supplements or appetite stimulants ? -5/10 albumin a little low, encourage PO intake ? -5/14 Appears to be eating well overall ?13. Diarrhea  ? -5/16 her last BM was 5/14, continue to monitor ? ?LOS: ?6 days ?A FACE TO FACE EVALUATION WAS PERFORMED ? ?Jennye Boroughs ?01/25/2022, 7:37 AM  ? ?  ?

## 2022-01-25 NOTE — Progress Notes (Signed)
Occupational Therapy Session Note ? ?Patient Details  ?Name: Cynthia [name] ?MRN: [id number] ?Date of Birth: June 01, 1946 ? ?Today's Date: 01/25/2022 ?OT Individual Time: 0930-1000 ?OT Individual Time Calculation (min): 30 min  ? ? ?Short Term Goals: ?Week 1:  OT Short Term Goal 1 (Week 1): Pt will complete toilet transfer with max A of 1 to [facility] to promote OOB toileting ?OT Short Term Goal 2 (Week 1): Pt will complete LB dressing mod A at the sit > stand level with AE PRN ?OT Short Term Goal 3 (Week 1): Pt will maintain dynamic sitting balance with supervision during ADL task ? ?Skilled Therapeutic Interventions/Progress Updates:  ?  Patient and nursing staff report that patient was found on the floor last night.  Per nursing staff CT of head without findings.  Patient unsure how she fell out of wheelchair.   ?Patient received supine in bed - awake and alert.  Skilled OT intervention focused on postural control, functional transitions, and basic self care tasks.  Patient assisted to remove clothing from last night and put on clean clothing.  Patient with decreased awareness and control of LUE/LLE.  Patient's left hand grasps objects, and she cannot volitionally release.  Patient aware, this does not seem to be new behavior to her.  Transferred via multiple squat pivots toward left with mod assist, giving patient time to shift weight forward.   ?Patient left up in wheelchair - expecting next therapist - wheelchair alarm in place and engaged, personal items and call bell within reach.   ? ?Therapy Documentation ?Precautions:  ?Precautions ?Precautions: Fall ?Precaution Comments: L LE extensor tone ?Restrictions ?Weight Bearing Restrictions: No ?Other Position/Activity Restrictions: tendency to stand on toes on L foot due to extension tone/L ankle contracture ? ?  ?Pain: ?Pain Assessment ?Pain Scale: 0-10 ?Pain Score: 0-No pain ? ? ? ?Therapy/Group: Individual Therapy ? ?Mariah Milling ?01/25/2022, 12:37 PM ?

## 2022-01-25 NOTE — Progress Notes (Signed)
Physical Therapy Session Note ? ?Patient Details  ?Name: Cynthia [name] ?MRN: [id number] ?Date of Birth: 11/20/1945 ? ?Today's Date: 01/25/2022 ?PT Individual Time: 5573-2202 ?PT Individual Time Calculation (min): 45 min  ? ?Short Term Goals: ?Week 1:  PT Short Term Goal 1 (Week 1): Patient will complete supine <> sit with MinA consistently ?PT Short Term Goal 2 (Week 1): Patient will complete sit <> stand with LRAD and ModA consistently ?PT Short Term Goal 3 (Week 1): Patient will ambulate at least 61f with LRAD and ModA + wc follow as needed ?PT Short Term Goal 4 (Week 1): Patient will initiate stair training ? ?Skilled Therapeutic Interventions/Progress Updates: Pt presented in w/c agreeable to therapy. Pt denies pain during session. PTA encouraged pt to recall what she had done today but realized nothing in memory book. Pt had written question for Dr YMarciano Sequinbut was in memory book not sheet marked "questions for Dr. YMarciano Sequin. PTA had pt write on sheet so she would be able to see in am. Pt then attempted w/c mobility towards ortho gym. When provided with a/b question of "where should hand be" pt was able to respond "wheel" and place hand on wheel rim however was unable to coordinate use of hemi body to propel w/c. Pt was able to use RLE only and propel to nsg station with supervision and min cues to maintain straight trajectory. In ortho gym pt performed squat pivot to mat with maxA due to poor sequencing. Performed Sit to stand x 3 with HHA. Pt noted to require minA to power up to stand however string posterior lean noted and strong L hip rotation which pt was unable to correct with verbal and tactile cues. PTA then stood in front of pt and had pt perform Sit to stand, PTA had more success with improved posture when stating, "Line up your head with mine" and "bring your hips close to me" with some manual facilitation. During seated rest break pt participated in seated reaching task of grasping cones with LUE and  placing on tray table to R to elongate L trunk and incorporate R rotation. [place] then [place] and performing same task with PTA providing manual facilitation at hips to achieve neutral. With PTA providing facilitation therapist noting that L heel was almost touching base of Stedy. Pt then attempted ambulation with HHA x 5 ft with maxA however pt began leaning towards mat ?attempt to provide support. Pt returned to mat and was able to follow cues to perform stand pivot to w/c with modA. Pt transported back to room at end of session and remained in w/c. Pt left with belt alarm on, call bell within reach and needs met.  ?   ? ?Therapy Documentation ?Precautions:  ?Precautions ?Precautions: Fall ?Precaution Comments: L LE extensor tone ?Restrictions ?Weight Bearing Restrictions: No ?Other Position/Activity Restrictions: tendency to stand on toes on L foot due to extension tone/L ankle contracture ?General: ?  ?Vital Signs: ?Therapy Vitals ?Temp: 98.7 ?F (37.1 ?C) ?Temp Source: Oral ?Pulse Rate: 90 ?Resp: 16 ?BP: (!) 110/54 ?Patient Position (if appropriate): Sitting ?Oxygen Therapy ?SpO2: 100 % ?O2 Device: Room Air ?Pain: ?  ?Mobility: ?  ?Locomotion : ?   ?Trunk/Postural Assessment : ?   ?Balance: ?  ?Exercises: ?  ?Other Treatments:   ? ? ? ?Therapy/Group: Individual Therapy ? ?Ariel Wingrove ?01/25/2022, 4:21 PM  ?

## 2022-01-25 NOTE — Progress Notes (Signed)
Occupational Therapy Session Note ? ?Patient Details  ?Name: Cynthia [name] ?MRN: [id number] ?Date of Birth: 06/19/1946 ? ?Today's Date: 01/25/2022 ?OT Individual Time: 1115-1208 ?OT Individual Time Calculation (min): 53 min  ? ? ?Short Term Goals: ?Week 1:  OT Short Term Goal 1 (Week 1): Pt will complete toilet transfer with max A of 1 to [facility] to promote OOB toileting ?OT Short Term Goal 2 (Week 1): Pt will complete LB dressing mod A at the sit > stand level with AE PRN ?OT Short Term Goal 3 (Week 1): Pt will maintain dynamic sitting balance with supervision during ADL task ? ?Skilled Therapeutic Interventions/Progress Updates:  ?  Pt received in wc ready for therapy. Pt taken to gym to focus on NMR of left side awareness, coordination, postural control. ? ?-W/c >< mat squat pivot 8x focusing on large forward weight shift to lift hips to allow pt to fully swivel hips with min -mod A ?-seated weight shifting and reaching to L with progressing from closed to open chain ?- L side coordination and visual awareness with scanning/tracking with moving cones from L to R side ?-forward lean to neutral with rolling ball forward over knees to shins ?- intergration of dual task of lifting arms with holding balance ? ?Pt discussed her fall. She said the wc tipped as she was reaching and trying to pull a belt off of the bed rail. She said she never fell out of the chair.  Sat in her wc and simulated the action she described and chair does tip easily to the side due the force.  It is not an issue with the w/c but explained she can not torque a force laterally as wc is not wide enough or heavy enough not to tip. Encouraged her to call for help if something is out of reach.  Pt felt very embarrassed by the whole situation but did realize it was a good learning experience.   ? ?Pt taken back to room with all needs met and belt alarm on.  ? ?Therapy Documentation ?Precautions:  ?Precautions ?Precautions: Fall ?Precaution Comments: L  LE extensor tone ?Restrictions ?Weight Bearing Restrictions: No ?Other Position/Activity Restrictions: tendency to stand on toes on L foot due to extension tone/L ankle contracture ? ?  ?Vital Signs: ?Therapy Vitals ?Temp: 98.7 ?F (37.1 ?C) ?Temp Source: Oral ?Pulse Rate: 90 ?Resp: 16 ?BP: (!) 110/54 ?Patient Position (if appropriate): Sitting ?Oxygen Therapy ?SpO2: 100 % ?O2 Device: Room Air ?Pain: ?Pain Assessment ?Pain Scale: 0-10 ?Pain Score: 0-No pain ?ADL: ?ADL ?Eating: Set up ?Where Assessed-Eating: Bed level ?Grooming: Supervision/safety ?Where Assessed-Grooming: Edge of bed ?Upper Body Bathing: Supervision/safety ?Where Assessed-Upper Body Bathing: Shower ?Lower Body Bathing: Moderate assistance ?Where Assessed-Lower Body Bathing: Shower ?Upper Body Dressing: Moderate assistance ?Where Assessed-Upper Body Dressing: Wheelchair ?Lower Body Dressing: Maximal assistance ?Where Assessed-Lower Body Dressing: Wheelchair ?Toileting: Maximal assistance ?Where Assessed-Toileting: Bed level ?Toilet Transfer: Dependent ?Toilet Transfer Method: Other (comment) (stedy) ?Toilet Transfer Equipment: Bedside commode ?Tub/Shower Transfer: Unable to assess ?Tub/Shower Transfer Method: Unable to assess ?Walk-In Shower Transfer: Minimal assistance (WITH USE OF STEDY) ?Walk-In Shower Transfer Method:  (STEDY) ?Walk-In Shower Equipment: Transfer tub bench ? ? ?Therapy/Group: Individual Therapy ? ?, ?01/25/2022, 1:10 PM ?

## 2022-01-26 NOTE — Progress Notes (Signed)
Physical Therapy Weekly Progress Note ? ?Patient Details  ?Name: Cynthia [name] ?MRN: [id number] ?Date of Birth: 1946/03/04 ? ?Beginning of progress report period: Jan 19, 2022 ?End of progress report period: Jan 26, 2022 ? ?Today's Date: 01/26/2022 ?PT Individual Time: 0800-0900 ?PT Individual Time Calculation (min): 60 min  ? ?Patient has met 2 of 3 short term goals.  Patient is making inconsistent progress toward her goals. Due to her L hemibody neglect, apraxia, limited attention to task and poor safety awareness, she remains grossly ModA/MaxA with Max multimodal cuing. She has sustained 2 unwitnessed falls. She has increased tone vs poor sensory processing to L LE resulting in PF tone with hallux hyperextension. She will require 24/7 assist upon d/c.  ? ?Patient continues to demonstrate the following deficits muscle weakness and muscle joint tightness, decreased cardiorespiratoy endurance, impaired timing and sequencing, abnormal tone, unbalanced muscle activation, motor apraxia, decreased coordination, and decreased motor planning, decreased midline orientation, decreased attention to left, left side neglect, and decreased motor planning, decreased initiation, decreased attention, decreased awareness, decreased problem solving, decreased safety awareness, decreased memory, and delayed processing, and decreased sitting balance, decreased standing balance, decreased postural control, hemiplegia, and decreased balance strategies and therefore will continue to benefit from skilled PT intervention to increase functional independence with mobility. ? ?Patient not progressing toward long term goals.  See goal revision..  Plan of care revisions: Goals downgraded to CGA/MinA to reflect progress made thus far. ? ?PT Short Term Goals ?Week 1:  PT Short Term Goal 1 (Week 1): Patient will complete supine <> sit with MinA consistently ?PT Short Term Goal 1 - Progress (Week 1): Progressing toward goal ?PT Short Term Goal 2  (Week 1): Patient will complete sit <> stand with LRAD and ModA consistently ?PT Short Term Goal 2 - Progress (Week 1): Met ?PT Short Term Goal 3 (Week 1): Patient will ambulate at least 44f with LRAD and ModA + wc follow as needed ?PT Short Term Goal 3 - Progress (Week 1): Met ?PT Short Term Goal 4 (Week 1): Patient will initiate stair training ?PT Short Term Goal 4 - Progress (Week 1): Met ?Week 2:  PT Short Term Goal 1 (Week 2): Patient will complete supine <> sit with MinA consistently and LRAD ?PT Short Term Goal 2 (Week 2): Patient will compelte bed <> wc transfer with MRahwayconsistently ?PT Short Term Goal 3 (Week 2): Patient will propel wc >129fwith no more than ModA ? ?Skilled Therapeutic Interventions/Progress Updates:  ?  Patient received sitting up in bed, agreeable to PT. She denies pain. PT and patient discussing recent falls. Patient still with poor insight/awareness of safety errors made. MD in/out for assessment. [place] patient will likely still need 24/7 assist to safely attend this wedding. Patient requiring verbal cues and ModA to come sit edge of bed with noted attention to task lasting ~10s at a time. Intermittent posterior lean/LOB with poor ability to self correct. MaxA to don pants. Upon standing ot don pants, patient found to have been incontinent. Patient reports being able to feel when she's voided or had a bowel movement, but did not inform this PT. Patient also reports needing to use the bathroom more, but did not inform PT until explicitly asked. Stedy used for transfer to toilet for efficiency. Patient with further void in toilet. Supervision for perihygiene seated. TotalA for clothing management. Stedy transfer back to wc. Patient completing hand hygiene at sink washing her  hands for ~19mns until PT intervened- patient reporting "I just forgot what I was supposed to be doing." PT transporting patient in wc to therapy gym for time management  and energy conservation. Able to stand in // bars with ModA and mirrors for visual feedback on posture. Yellow tape placed over B ASIS for increased feedback on hip rotation/positioning. Patient able to verbalize what she saw in the mirror (leaning, rotated hips) but unable to motor plan correcting without Max external assist. Patient able to ambulate a total of 127fin the // bars with grossly MaxA to advance L LE, weight shift, advance L UE on bar, correct hip rotation and prevent excessive posterior lean. Patient with difficulty "unlocking" L knee to sit requiring TotalA to essentially lift L LE from ground in order to be able to sit. Patient returning to room in wc, seatbelt alarm on, call light within reach.  ? ?Therapy Documentation ?Precautions:  ?Precautions ?Precautions: Fall ?Precaution Comments: L LE extensor tone ?Restrictions ?Weight Bearing Restrictions: No ?Other Position/Activity Restrictions: tendency to stand on toes on L foot due to extension tone/L ankle contracture ? ?Therapy/Group: Individual Therapy ? ?JeDebbora DusJeDebbora DusPT, DPT, CBIS ? ?01/26/2022, 7:51 AM  ?

## 2022-01-26 NOTE — Progress Notes (Signed)
Occupational Therapy Weekly Progress Note ? ?Patient Details  ?Name: Cynthia [name] ?MRN: [id number] ?Date of Birth: 1946-06-11 ? ?Beginning of progress report period: Jan 19, 2022 ?End of progress report period: Jan 26, 2022 ? ?Patient has met 2 of 3 short term goals.  Pt is making gradual progress with her mobility and self care.  She continues to have L neglect, delayed processing, memory and problem solving; impaired proprioception and balance and dual tasking. Mod A overall.  ? ?Patient continues to demonstrate the following deficits: unbalanced muscle activation, motor apraxia, and decreased coordination, left side neglect and decreased motor planning, decreased awareness, decreased problem solving, decreased safety awareness, decreased memory, and delayed processing, and decreased sitting balance, decreased standing balance, decreased postural control, hemiplegia, and decreased balance strategies and therefore will continue to benefit from skilled OT intervention to enhance overall performance with BADL and Reduce care partner burden. ? ?Patient progressing toward long term goals..  Continue plan of care. ? ?OT Short Term Goals ?Week 1:  OT Short Term Goal 1 (Week 1): Pt will complete toilet transfer with max A of 1 to [facility] to promote OOB toileting ?OT Short Term Goal 1 - Progress (Week 1): Met ?OT Short Term Goal 2 (Week 1): Pt will complete LB dressing mod A at the sit > stand level with AE PRN ?OT Short Term Goal 2 - Progress (Week 1): Met ?OT Short Term Goal 3 (Week 1): Pt will maintain dynamic sitting balance with supervision during ADL task ?OT Short Term Goal 3 - Progress (Week 1): Progressing toward goal ?Week 2:  OT Short Term Goal 1 (Week 2): Pt will be able to transfer to toilet with mod A. ?OT Short Term Goal 2 (Week 2): Pt will be able to stand at toilet with mod A during clothing management. ?OT Short Term Goal 3 (Week 2): Pt will demonstrate improve dual tasking during shower to maintain her  balance while washing UB. ?OT Short Term Goal 4 (Week 2): Pt will demonstrate improved L side awareness to don shirt with only 3 cues or less. ? ?   ? ?Therapy Documentation ?Precautions:  ?Precautions ?Precautions: Fall ?Precaution Comments: L LE extensor tone ?Restrictions ?Weight Bearing Restrictions: No ?Other Position/Activity Restrictions: tendency to stand on toes on L foot due to extension tone/L ankle contracture ? ?  ?Vital Signs: ?Therapy Vitals ?Temp: 98.5 ?F (36.9 ?C) ?Temp Source: Oral ?Pulse Rate: 71 ?Resp: 18 ?BP: 96/63 ?Patient Position (if appropriate): Lying ?Oxygen Therapy ?SpO2: 97 % ?O2 Device: Room Air ? ?  ?ADL: ?ADL ?Eating: Set up ?Where Assessed-Eating: Bed level ?Grooming: Supervision/safety ?Where Assessed-Grooming: Edge of bed ?Upper Body Bathing: Supervision/safety ?Where Assessed-Upper Body Bathing: Shower ?Lower Body Bathing: Moderate assistance ?Where Assessed-Lower Body Bathing: Shower ?Upper Body Dressing: Moderate assistance ?Where Assessed-Upper Body Dressing: Wheelchair ?Lower Body Dressing: Maximal assistance ?Where Assessed-Lower Body Dressing: Wheelchair ?Toileting: Maximal assistance ?Where Assessed-Toileting: Bed level ?Toilet Transfer: Dependent ?Toilet Transfer Method: Other (comment) (stedy) ?Toilet Transfer Equipment: Bedside commode ?Tub/Shower Transfer: Unable to assess ?Tub/Shower Transfer Method: Unable to assess ?Walk-In Shower Transfer: Minimal assistance (WITH USE OF STEDY) ?Walk-In Shower Transfer Method:  (STEDY) ?Walk-In Shower Equipment: Radio broadcast assistant ? ?[place] ?01/26/2022, 8:31 AM  ?

## 2022-01-26 NOTE — Progress Notes (Signed)
Patient ID: Cynthia [name], female   DOB: 05/30/1946, 76 y.o.   MRN: [id number]  Met with pt and spoke with Andrew-son via telephone to discuss team conference goals of min assist level and target discharge of 5/31. Aware Mom will need someone with her to assist and provide a safe environment due to her left neglect and high risk to fall. He reports he was worried about this and does not really know what they plan to do. Encouraged him to talk with is brother in [place] and come up with what they ca and can not do to assist her and let this worker know and we can work on this. Discussed what home health provides and that her managed care Medicaid may not cover going to a SNF but can try if this is what they want to do. Aware could be denied will leave in room private duty list in case want to pursue this. Complicates plan in that pt's sister's husband had a CVA right after pt she was the one who usually assisted with pt when she needed assist. Work on best plan for pt, will await son's return call ?

## 2022-01-26 NOTE — Progress Notes (Signed)
?                                                       PROGRESS NOTE ? ? ?Subjective/Complaints: ?Pt found on the floor after trying to get to the sink by herself. She says "im used to being independent. Denies fall.  ? ?Review of Systems  ?Constitutional:  Negative for chills and fever.  ?Respiratory:  Negative for cough and shortness of breath.   ?Cardiovascular:  Negative for chest pain.  ?Gastrointestinal:  Negative for abdominal pain, constipation, nausea and vomiting.  ?Musculoskeletal:  Negative for falls and neck pain.  ?Neurological:  Negative for headaches.  ?Psychiatric/Behavioral:  Negative for depression.    ? ?Objective: ?  ?CT HEAD WO CONTRAST (5MM) ? ?Result Date: 01/24/2022 ?CLINICAL DATA:  Known right frontal intraparenchymal hematoma, fell and hit head this evening EXAM: CT HEAD WITHOUT CONTRAST TECHNIQUE: Contiguous axial images were obtained from the base of the skull through the vertex without intravenous contrast. RADIATION DOSE REDUCTION: This exam was performed according to the departmental dose-optimization program which includes automated exposure control, adjustment of the mA and/or kV according to patient size and/or use of iterative reconstruction technique. COMPARISON:  01/11/2022, 01/13/2022 FINDINGS: Brain: Decreased prominence of the right frontal pericallosal intraparenchymal hematoma and associated subarachnoid hemorrhage seen previously, with the intraparenchymal hemorrhage now measuring 1.9 x 1.3 x 2.1 cm, where previously this had measured approximately 3.2 x 2.4 by 3.2 cm. There is persistent but decreasing vasogenic edema within the right frontal lobe. No new areas of infarct or hemorrhage. The lateral ventricles and remaining midline structures are stable, with extensive chronic small-vessel ischemic changes seen elsewhere throughout the periventricular white matter. No mass effect or midline shift. Vascular: No hyperdense vessel or unexpected calcification. Skull: Normal.  Negative for fracture or focal lesion. Sinuses/Orbits: No acute finding. Other: None. IMPRESSION: 1. Improving right frontal pericallosal intraparenchymal hematoma and adjacent subarachnoid hemorrhage, with decreased surrounding vasogenic edema. No mass effect or midline shift. 2. No new infarct or hemorrhage. Electronically Signed   By: Randa Ngo M.D.   On: 01/24/2022 21:06   ?Recent Labs  ?  01/25/22 ?0702  ?WBC 7.4  ?HGB 11.7*  ?HCT 34.0*  ?PLT 321  ? ? ?Recent Labs  ?  01/25/22 ?0702  ?NA 140  ?K 3.9  ?CL 105  ?CO2 30  ?GLUCOSE 90  ?BUN 16  ?CREATININE 0.73  ?CALCIUM 8.9  ? ? ? ?Intake/Output Summary (Last 24 hours) at 01/26/2022 0740 ?Last data filed at 01/25/2022 1903 ?Gross per 24 hour  ?Intake 820 ml  ?Output --  ?Net 820 ml  ? ?  ? ?  ? ?Physical Exam: ?Vital Signs ?Blood pressure 96/63, pulse 71, temperature 98.5 ?F (36.9 ?C), temperature source Oral, resp. rate 18, height '5\' 6"'$  (1.676 m), weight 61.4 kg, SpO2 97 %. ? ? ? ?Assessment/Plan: ?1. Functional deficits which require 3+ hours per day of interdisciplinary therapy in a comprehensive inpatient rehab setting. ?Physiatrist is providing close team supervision and 24 hour management of active medical problems listed below. ?Physiatrist and rehab team continue to assess barriers to discharge/monitor patient progress toward functional and medical goals ? ?Care Tool: ? ?Bathing ?   ?Body parts bathed by patient: Right arm, Left arm, Chest, Abdomen, Front perineal area, Right upper leg, Left upper leg,  Face  ? Body parts bathed by helper: Right lower leg, Left lower leg, Buttocks ?  ?  ?Bathing assist Assist Level: Moderate Assistance - Patient 50 - 74% ?  ?  ?Upper Body Dressing/Undressing ?Upper body dressing   ?What is the patient wearing?: Bra, Pull over shirt ?   ?Upper body assist Assist Level: Moderate Assistance - Patient 50 - 74% ?   ?Lower Body Dressing/Undressing ?Lower body dressing ? ? ?   ?What is the patient wearing?: Pants, Incontinence  brief ? ?  ? ?Lower body assist Assist for lower body dressing: Maximal Assistance - Patient 25 - 49% ?   ? ?Toileting ?Toileting    ?Toileting assist Assist for toileting: Total Assistance - Patient < 25% ?  ?  ?Transfers ?Chair/bed transfer ? ?Transfers assist ?   ? ?Chair/bed transfer assist level: Moderate Assistance - Patient 50 - 74% ?  ?  ?Locomotion ?Ambulation ? ? ?Ambulation assist ? ?   ? ?Assist level: 2 helpers ?Assistive device: Hand held assist ?Max distance: 5  ? ?Walk 10 feet activity ? ? ?Assist ? Walk 10 feet activity did not occur: Safety/medical concerns ? ?  ?   ? ?Walk 50 feet activity ? ? ?Assist Walk 50 feet with 2 turns activity did not occur: Safety/medical concerns ? ?  ?   ? ? ?Walk 150 feet activity ? ? ?Assist Walk 150 feet activity did not occur: Safety/medical concerns ? ?  ?  ?  ? ?Walk 10 feet on uneven surface  ?activity ? ? ?Assist Walk 10 feet on uneven surfaces activity did not occur: Safety/medical concerns ? ? ?  ?   ? ?Wheelchair ? ? ? ? ?Assist Is the patient using a wheelchair?: Yes ?Type of Wheelchair: Manual ?  ? ?Wheelchair assist level: Total Assistance - Patient < 25% ?Max wheelchair distance: 150  ? ? ?Wheelchair 50 feet with 2 turns activity ? ? ? ?Assist ? ?  ?  ? ? ?Assist Level: Total Assistance - Patient < 25%  ? ?Wheelchair 150 feet activity  ? ? ? ?Assist ?   ? ? ?Assist Level: Total Assistance - Patient < 25%  ? ?Blood pressure 96/63, pulse 71, temperature 98.5 ?F (36.9 ?C), temperature source Oral, resp. rate 18, height '5\' 6"'$  (1.676 m), weight 61.4 kg, SpO2 97 %. ? ? ? ?General: Alert, No apparent distress. In Bed ?HEENT: Head is normocephalic,  PERRLA, EOMI, oral mucosa pink and moist ?Neck: Supple  ?Heart: Reg rate and rhythm.  ?Chest: CTA bilaterally without wheezes, rales, or rhonchi; Normal effort ?Abdomen: Soft, non-tender, non-distended, bowel sounds positive. ?Extremities: No clubbing, cyanosis, or edema. ?Psych: Pt's affect is appropriate.  pleasant ?Skin: warm and dry ?Neuro:  Alert. Some neglect of left side, appears a little improved. Follows commands. ?CN 2-12 grossly intact  ?Musculoskeletal: 5/5 RUE and RLE ?3-4-/5 in LUE ?L HF 4-/5, Knee extension 4/5, Ankle FP/DF 3/5 ?No ataxia or dysmetria noted ? ?Medical Problem List and Plan: ?1. Functional deficits secondary to intraparenchymal hematoma in the right rontal lobe ?            -patient may  shower ?            -ELOS/Goals: 12-14 days supervision ? -Completed gait training for 30 ft ?            -Continue CIR for PT, OT and SLP ? -With OT needing more help with open chain type activities ? -CT head after  fall 5/15 without acute changes, discussed fall prevention ? -Discussed calling nurses when needed and not getting up herself. She agrees.  ? -Team conference today ?2.  Antithrombotics: ?-DVT/anticoagulation:  Mechanical: Antiembolism stockings, thigh (TED hose) Bilateral lower extremities ?            No Lovenoxetc due to IPH ?            -antiplatelet therapy: N/A ?3. Pain Management: ?Pain well controlled ?4. Mood: Effexor 75 mg daily ?            -antipsychotic agents: N/A ? -5/11 Good mood today, continue effexor '75mg'$  ? -5/13 Mood reported to be good overall ?5. Neuropsych: This patient is capable of making decisions on her own behalf. ?6. Skin/Wound Care: Routine skin check ?7. Fluids/Electrolytes/Nutrition: Routine in and out with follow-up chemistries ? -5/11 midline removed yesterday ?8.  Seizure disorder- new onset since IPH-.  Keppra 1000 mg every 12 hours ?-Denies any new epileptic events, continue keppra ?9.  Hypertension.  Cozaar 100 mg daily.  Monitor with increased mobility ? Well controlled BP overall, follow ?10.  History of left breast cancer with radioactive seed localization 08/28/2020.  Follow-up outpatient ?11. Hx of R frontal ICH 2017 ?12. Poor appetite- might need supplements or appetite stimulants ? -5/10 albumin a little low, encourage PO intake ? -5/17 Eating 100  Percent of meals ?13. Diarrhea  ? -5/17 resolved ? ?LOS: ?7 days ?A FACE TO FACE EVALUATION WAS PERFORMED ? ?Jennye Boroughs ?01/26/2022, 7:40 AM  ? ?  ?

## 2022-01-26 NOTE — Plan of Care (Signed)
?  Problem: RH Attention ?Goal: LTG Patient will demonstrate this level of attention during functional activites (SLP) ?Description: LTG:  Patient will will demonstrate this level of attention during functional activites (SLP) ?Flowsheets ?Taken 01/26/2022 1436 ?Patient will demonstrate during cognitive/linguistic activities the attention type of: Selective ?LTG: Patient will demonstrate this level of attention during cognitive/linguistic activities with assistance of (SLP): Supervision ?Taken 01/19/2022 1413 ?Patient will demonstrate this level of attention during cognitive/linguistic activities in: Controlled ?Note: Downgraded due to inconsistency  ?  ?

## 2022-01-26 NOTE — Progress Notes (Signed)
Physical Therapy Session Note  Patient Details  Name: Cynthia [name] MRN: [id number] Date of Birth: 04/17/1946  Today's Date: 01/26/2022 PT Individual Time: 2500-3704 PT Individual Time Calculation (min): 42 min   Short Term Goals:  Week 2:  PT Short Term Goal 1 (Week 2): Patient will complete supine <> sit with MinA consistently and LRAD PT Short Term Goal 2 (Week 2): Patient will compelte bed <> wc transfer with ModA consistently PT Short Term Goal 3 (Week 2): Patient will propel wc >58f with no more than ModA   Skilled Therapeutic Interventions/Progress Updates:   Pt received sitting in WC and agreeable to PT  WC mobility through hall x 2073fwith supervision assist using RLE and intermitent RUE for propulsion and steer through doorways..   Sit<>stand in parallel bars with superivcsion assist x 3. Partial lunge 2 x 5 Bil and static semitandem stance 3 x 30sec hold bil. Moderate cues for posture, and sequencing for weight shift and knee flexion on the LLE.   Gait with RW 5036fith min assist for AD management as well as min-mod cues for posture to prevent L lateral bias and for improved step width on the LLE. Pt able to demonstrate adequate step width 75% of steps on this day. Noted to have initial contact with forefoot, but near neutral ankle positioning in stance on the L side.   [facility] to room and [facility] and all needs met.      Therapy Documentation Precautions:  Precautions Precautions: Fall Precaution Comments: L LE extensor tone Restrictions Weight Bearing Restrictions: No Other Position/Activity Restrictions: tendency to stand on toes on L foot due to extension tone/L ankle contracture   Pain: denies     Therapy/Group: Individual Therapy  AusLorie Phenix17/2023, 11:19 AM

## 2022-01-26 NOTE — Progress Notes (Signed)
Speech Language Pathology Weekly Progress and Session Note ? ?Patient Details  ?Name: Cynthia [name] ?MRN: [id number] ?Date of Birth: [birth year] ? ?Beginning of progress report period: Jan 19, 2022 ?End of progress report period: Jan 26, 2022 ? ?Today's Date: 01/26/2022 ?SLP Individual Time: 5945-8592 ?SLP Individual Time Calculation (min): 60 min ? ?Short Term Goals: ?Week 1: SLP Short Term Goal 1 (Week 1): Pt will demonstrate recall of daily, novel information with use of internal/external aids given mod A verbal cues ?SLP Short Term Goal 1 - Progress (Week 1): Met ?SLP Short Term Goal 2 (Week 1): Pt will demonstrate basic- mildly complex problem solving with min A verbal cues. ?SLP Short Term Goal 2 - Progress (Week 1): Not met ?SLP Short Term Goal 3 (Week 1): Pt will self-monitor and self-correct errors in functional task with mod A verbal cues. ?SLP Short Term Goal 3 - Progress (Week 1): Not met ?SLP Short Term Goal 4 (Week 1): Pt will demonstrate alternating attention within 10 minute intervals with mod A verbal cues. ?SLP Short Term Goal 4 - Progress (Week 1): Not met ? ?  ?New Short Term Goals: ?Week 2: SLP Short Term Goal 1 (Week 2): Pt will demonstrate sustained attention to functional tasks for 15-20 minutes with min A verbal cues. ?SLP Short Term Goal 2 (Week 2): Pt will demonstrate basic problem solving skills with min A verbal cues. ?SLP Short Term Goal 3 (Week 2): Pt will self-monitor and self-correct errors in functional tasks with mod A verbal cues. ?SLP Short Term Goal 4 (Week 2): Pt will demonstrate recall of daily, novel information with min A verbal cues for external aids. ? ?Weekly Progress Updates: Pt met 1 out 4 goals, due to fluctuating skills in attention, recall, problem solving and safety awareness. SLP implemented memory notebook, in which pt is able to utilize external strategies with mod A verbal cues. Pt has completed functional sequencing and problem solving tasks. Pt's barriers are  inconsistent sustained/selective attention, basic problem solving, recalling within a task and error awareness. SLP has reduced attention goals to selective/sustained attention. SLP will focus on pt identify when problem solving skills have failed and expressing need for assistance to hopefully carryover self-correcting skills. Pt would continue to benefit from skilled ST services in order to maximize functional independence and reduce burden of care, requiring 24 hour supervision at discharge with continued skilled ST services. ? ?  ? ? ?Intensity: Minumum of 1-2 x/day, 30 to 90 minutes ?Frequency: 3 to 5 out of 7 days ?Duration/Length of Stay: 5/31 ?Treatment/Interventions: Cognitive remediation/compensation;Cueing hierarchy;Functional tasks;Medication managment;Patient/family education;Internal/external aids ? ? ?Daily Session ? ?Skilled Therapeutic Interventions:  Skilled ST services focused on cognitive skills. SLP facilitated reassessment of cognitive skills due to limited progress and recent falls. Pt scored 23/30 on SLUMS, 4 points higher than on evulation with improvements in attention, problem solving and recall. However, pt's functional skills continue to fluctuate, Pt completed novel PEG design task x1 mod I and second task required max A verbal cues for problem solving and asking for assistance. Both tasks were at a basic level. SLP and pt discussed expressing assistance when unable to problem solve. Pt was left in room with call bell within reach and chair alarm set. SLP recommends to continue skilled services.   ? ?General  ?  ?Pain ?Pain Assessment ?Pain Scale: 0-10 ?Pain Score: 0-No pain ? ?Therapy/Group: Individual Therapy ? ?Najma Bozarth ?01/26/2022, 2:36 PM ? ? ? ? ? ? ?

## 2022-01-26 NOTE — Progress Notes (Signed)
Occupational Therapy Session Note ? ?Patient Details  ?Name: Cynthia [name] ?MRN: [id number] ?Date of Birth: Jan 30, 1946 ? ?Today's Date: 01/26/2022 ?OT Individual Time: 9678-9381 ?OT Individual Time Calculation (min): 45 min  ? ? ?Short Term Goals: ?Week 1:  OT Short Term Goal 1 (Week 1): Pt will complete toilet transfer with max A of 1 to [facility] [place] to promote OOB toileting ?OT Short Term Goal 2 (Week 1): Pt will complete LB dressing mod A at the sit > stand level with AE PRN ?OT Short Term Goal 3 (Week 1): Pt will maintain dynamic sitting balance with supervision during ADL task ? ?Skilled Therapeutic Interventions/Progress Updates:  ?  Self-care management activity completed in patient's room focusing on donning/doffing footwear, hooded pull over sweatshirt, and oral care.  ?Completed all activities seated in wheelchair. Increased time required to complete footwear and sweatshirt task. Pt appeared to get mentally "stuck". During tasks, she appeared to be focused on steps although ~ 1 min into the task, she would stop as if a pause button was pushed. Initially allowed patient to attempt each task independently with assist or prompts.  ?Footwear: slip on shoes used. Doffed hospital socks independently. VC were provided to donn shoes for technique. Able to place right arm into sweatshirt without difficulty. Attempted to put her head through the left arm sleeve instead of head opening. This was completed during 2 attempts. At 3rd attempt, pt required max VC and visual cues as well as min assist to correctly manage head and left arm. To doff sweatshirt required min assist when patient's left arm became stuck at bottom of shirt.  ?Oral care was completed at supervision. Patient was able to manage supplies and complete all steps independently. OT provided one prompt asking pt if she needed water to swish mouth out and patient agreed.  ? ?Therapy Documentation ?Precautions:  ?Precautions ?Precautions: Fall ?Precaution Comments:  L LE extensor tone ?Restrictions ?Weight Bearing Restrictions: No ?Other Position/Activity Restrictions: tendency to stand on toes on L foot due to extension tone/L ankle contracture ? ?Pain: ?Pain Assessment ?Pain Scale: 0-10 ?Pain Score: 0-No pain ? ? ? ?Therapy/Group: Individual Therapy ? ?Ailene Ravel, OTR/L,CBIS  ?[phone] ? ?01/26/2022, 12:38 PM ?

## 2022-01-26 NOTE — Plan of Care (Signed)
?  Problem: Consults ?Goal: RH BRAIN INJURY PATIENT EDUCATION ?Description: Description: See Patient Education module for eduction specifics ?Outcome: Progressing ?  ?Problem: RH BOWEL ELIMINATION ?Goal: RH STG MANAGE BOWEL WITH ASSISTANCE ?Description: STG Manage Bowel with mod I  Assistance. ?Outcome: Progressing ?Goal: RH STG MANAGE BOWEL W/MEDICATION W/ASSISTANCE ?Description: STG Manage Bowel with Medication with mod I Assistance. ?Outcome: Progressing ?  ?Problem: RH BLADDER ELIMINATION ?Goal: RH STG MANAGE BLADDER WITH ASSISTANCE ?Description: STG Manage Bladder With  toileting Assistance ?Outcome: Progressing ?  ?Problem: RH SAFETY ?Goal: RH STG ADHERE TO SAFETY PRECAUTIONS W/ASSISTANCE/DEVICE ?Description: STG Adhere to Safety Precautions With cues Assistance/Device. ?Outcome: Progressing ?  ?Problem: RH PAIN MANAGEMENT ?Goal: RH STG PAIN MANAGED AT OR BELOW PT'S PAIN GOAL ?Description: At or below level 4 with prns ?Outcome: Progressing ?  ?Problem: RH KNOWLEDGE DEFICIT BRAIN INJURY ?Goal: RH STG INCREASE KNOWLEDGE OF SELF CARE AFTER BRAIN INJURY ?Description: Patient and son will be able to manage care at discharge using handouts and educational tools independently ?Outcome: Progressing ?  ?Problem: RH Vision ?Goal: RH LTG Vision (Specify) ?Outcome: Progressing ?  ?Problem: RH KNOWLEDGE DEFICIT ?Goal: RH STG INCREASE KNOWLEDGE OF HYPERTENSION ?Description: Patient and son will be able to manage HTN with medications and dietary modifications using handouts and educational tools independently ?Outcome: Progressing ?  ?

## 2022-01-26 NOTE — Progress Notes (Signed)
?  Patient found on floor during shift change by night shift tech. Patient sitting on the floor, un harmed and no c/o pain or discomfort. Chair alarm was intact but removed by patient. When asked how she fell she stated "I didn't fall, I got on the floor to crawl to the sink for mouth wash. I did not want to bother you guys." Day shift nurse Ed called on call PA Risa Grill and this nurse called son to notify. Tele-sitter monitor was ordered and post fall protocol initiated. Have continued with plan of care. Patient in bed with bed alarm on and call bell within reach. ? ? ? ? 01/25/22 1921  ?What Happened  ?Was fall witnessed? No  ?Was patient injured? No  ?Patient found on floor  ?Found by Staff-comment ?(tech-Cherise)  ?Stated prior activity to/from bed, chair, or stretcher  ?Follow Up  ?MD notified Risa Grill  ?Time MD notified 1930  ?Family notified Yes - comment  ?Time family notified 1932  ?Additional tests No  ?Progress note created (see row info) Yes  ?Adult Fall Risk Assessment  ?Risk Factor Category (scoring not indicated) History of more than one fall within 6 months before admission (document High fall risk);Fall has occurred during this admission (document High fall risk);High fall risk per protocol (document High fall risk)  ?Patient Fall Risk Level High fall risk  ?Adult Fall Risk Interventions  ?Required Bundle Interventions *See Row Information* High fall risk - low, moderate, and high requirements implemented  ?Additional Interventions Camera surveillance (with patient/family notification & education);Reorient/diversional activities with confused patients;Use of appropriate toileting equipment (bedpan, BSC, etc.)  ?Screening for Fall Injury Risk (To be completed on HIGH fall risk patients) - Assessing Need for Floor Mats  ?Risk For Fall Injury- Criteria for Floor Mats Previous fall this admission  ?Will Implement Floor Mats Yes  ?Vitals  ?Temp 98.4 ?F (36.9 ?C)  ?Temp Source Oral  ?BP (!)  152/75  ?MAP (mmHg) 96  ?BP Location Right Arm  ?BP Method Automatic  ?Patient Position (if appropriate) Sitting  ?Pulse Rate (!) 110  ?Pulse Rate Source [place]  ?Resp 16  ?Oxygen Therapy  ?SpO2 98 %  ?O2 Device Room Air  ?Pain Assessment  ?Pain Scale 0-10  ?Pain Score 0  ?Neurological  ?Neuro (WDL) X  ?Level of Consciousness Alert  ?Orientation Level Oriented to person;Oriented to place;Oriented to time  ?Cognition Follows commands  ?Speech Clear  ?R Pupil Size (mm) 3  ?R Pupil Shape Round  ?R Pupil Reaction Brisk  ?L Pupil Size (mm) 3  ?L Pupil Shape Round  ?L Pupil Reaction Brisk  ?Motor Function/Sensation Assessment Motor response  ?RUE Motor Response Purposeful movement  ?RUE Sensation Full sensation  ?RUE Motor Strength 4  ?LUE Motor Response Purposeful movement  ?LUE Sensation Decreased  ?LUE Motor Strength 3  ?RLE Motor Response Purposeful movement  ?RLE Sensation Full sensation  ?RLE Motor Strength 4  ?LLE Motor Response Purposeful movement  ?LLE Sensation Decreased  ?LLE Motor Strength 3  ?Neuro Symptoms None  ? ? ?

## 2022-01-26 NOTE — Plan of Care (Signed)
?  Problem: RH Balance ?Goal: LTG Patient will maintain dynamic standing balance (PT) ?Description: LTG:  Patient will maintain dynamic standing balance with assistance during mobility activities (PT) ?Flowsheets (Taken 01/26/2022 1125) ?LTG: Pt will maintain dynamic standing balance during mobility activities with:: (downgraded due to patient progress) Minimal Assistance - Patient > 75% ?  ?Problem: Sit to Stand ?Goal: LTG:  Patient will perform sit to stand with assistance level (PT) ?Description: LTG:  Patient will perform sit to stand with assistance level (PT) ?Flowsheets (Taken 01/26/2022 1125) ?LTG: PT will perform sit to stand in preparation for functional mobility with assistance level: (downgraded due to patient progress) Minimal Assistance - Patient > 75% ?  ?Problem: RH Bed Mobility ?Goal: LTG Patient will perform bed mobility with assist (PT) ?Description: LTG: Patient will perform bed mobility with assistance, with/without cues (PT). ?Flowsheets (Taken 01/26/2022 1125) ?LTG: Pt will perform bed mobility with assistance level of: (downgraded due to patient progress) Contact Guard/Touching assist ?  ?Problem: RH Bed to Chair Transfers ?Goal: LTG Patient will perform bed/chair transfers w/assist (PT) ?Description: LTG: Patient will perform bed to chair transfers with assistance (PT). ?Flowsheets (Taken 01/26/2022 1125) ?LTG: Pt will perform Bed to Chair Transfers with assistance level: (downgraded due to patient progress) Minimal Assistance - Patient > 75% ?  ?Problem: RH Wheelchair Mobility ?Goal: LTG Patient will propel w/c in controlled environment (PT) ?Description: LTG: Patient will propel wheelchair in controlled environment, # of feet with assist (PT) ?Flowsheets (Taken 01/26/2022 1125) ?LTG: Pt will propel w/c in controlled environ  assist needed:: (downgraded due to patient progress) Contact Guard/Touching assist ?Goal: LTG Patient will propel w/c in home environment (PT) ?Description: LTG: Patient  will propel wheelchair in home environment, # of feet with assistance (PT). ?Flowsheets (Taken 01/26/2022 1125) ?LTG: Pt will propel w/c in home environ  assist needed:: (downgraded due to patient progress) Contact Guard/Touching assist ?  ?

## 2022-01-27 LAB — URINALYSIS, ROUTINE W REFLEX MICROSCOPIC
Bilirubin Urine: NEGATIVE
Glucose, UA: NEGATIVE mg/dL
Ketones, ur: NEGATIVE mg/dL
Nitrite: POSITIVE — AB
Protein, ur: NEGATIVE mg/dL
Specific Gravity, Urine: 1.013 (ref 1.005–1.030)
WBC, UA: >50 WBC/hpf — ABNORMAL HIGH (ref 0–5)
pH: 5 (ref 5.0–8.0)

## 2022-01-27 NOTE — Patient Care Conference (Signed)
Inpatient RehabilitationTeam Conference and Plan of Care Update Date: 01/26/2022   Time: 12:08 PM    Patient Name: Cynthia [name]      Medical Record Number: [id number]  Date of Birth: 04/24/1946 Sex: Female         Room/Bed: 4M06C/4M06C-01 Payor Info: Payor: [employer] MEDICARE / Plan: [place] PPO / Product Type: *No Product type* /    Admit Date/Time:  01/19/2022  3:03 AM  Primary Diagnosis:  Intracerebral hemorrhage [facility])  Hospital Problems: Principal Problem:   Intracerebral hemorrhage Active Problems:   ICH (intracerebral hemorrhage) ([place])   Left-sided visual neglect   Partial symptomatic epilepsy with complex partial seizures, not intractable, without status epilepticus ([place])   Mood disorder in conditions classified elsewhere   Poor appetite    Expected Discharge Date: Expected Discharge Date: 02/09/22  Team Members Present: Physician leading conference: Other (comment) (Dr. Jennye Boroughs) Social Worker Present: Ovidio Kin, LCSW Nurse Present: Dorien Chihuahua, RN PT Present: Estevan Ryder, PT OT Present: Meriel Pica, OT SLP Present: Weston Anna, SLP PPS Coordinator present : Gunnar Fusi, SLP     Current Status/Progress Goal Weekly Team Focus  Bowel/Bladder     Incontinent of bladder   Continent of bowel and bladder   Toileting protocol, encourage recognition of incontinence  Swallow/Nutrition/ Hydration             ADL's   min UB, mod LB, mod transfers, decreased postural control, L neglect, impair dual tasking  CGA overall (may need to be downgraded to min A)  Pt education, ADL training, NMR - postural control, L side awareness   Mobility   up to MaxA bed mob, up to MaxA STS/stand pivot, MaxA gait x60f with HHA, MaxA/TotalA wc mob, very poor dual tasking, very poor attention to task, L hemibody neglect apparent, incr tone in L LE, will likely need some type of bracing and rx management of tone, may ultimately need custom wc if not  progressing to functional ambulator  CGA/MinA  L hemi awareness, gait, transfers, wc mob, balance, posture, stairs, pt/fam ed, dc planning   Communication             Safety/Cognition/ Behavioral Observations  max A for basic tasks  min A - will likely downgrade to mod A  functional problem solving, utilizing memory notebook, increasing insight, self monitor/correct   Pain     Managed with prn meds        Skin     N/A          Discharge Planning:  HOme with son who does work third shift-sister possibly to assist but busy with husband who has aslo had a CVA. Aware will need 24/7 care will need to confirm plan with family   Team Discussion: CT scan post fall WNL. Patient is exhibiting unsafe behaviors and lack of awareness of safety. Left neglect was an issue PTA; now with left hemi body neglect, plantar flex tone, posterior bias, apraxia, poor attention, in-coordination and memory issues. She has trouble with dual tasking.  Patient on target to meet rehab goals: Currently she needs max assist for bed mobility and sequencing. Able to ambulate up to 30' with HHA and max assist. Requires max assist for cognition and memory.  *See Care Plan and progress notes for long and short-term goals.   Revisions to Treatment Plan:  Tone management Custom Wheelchair consult Left LE brace   Teaching Needs: Safety, transfers, toileting, medications, etc  Current Barriers to Discharge: Home  enviroment access/layout and Lack of/limited family support; patient does not have 24/7 care  Possible Resolutions to Barriers: Family education HH follow up services     Medical Summary Current Status: intraparenchymal hematoma in the right rontal lobe, left neglect, pain, HTN, Seizure DO, decreased appetite, fall, HTN  Barriers to Discharge: Medical stability;Home enviroment access/layout;Other (comments);Behavior  Barriers to Discharge Comments: intraparenchymal hematoma in the right rontal lobe, left  neglect, pain, HTN, Seizure DO, decreased appetite, fall, HTN Possible Resolutions to [employer] Focus: follow labs, monitor PO intake, follow HTN, safety measures   Continued Need for Acute Rehabilitation Level of Care: The patient requires daily medical management by a physician with specialized training in physical medicine and rehabilitation for the following reasons: Direction of a multidisciplinary physical rehabilitation program to maximize functional independence : Yes Medical management of patient stability for increased activity during participation in an intensive rehabilitation regime.: Yes Analysis of laboratory values and/or radiology reports with any subsequent need for medication adjustment and/or medical intervention. : Yes   I attest that I was present, lead the team conference, and concur with the assessment and plan of the team.   Dorien Chihuahua B 01/27/2022, 8:09 AM

## 2022-01-27 NOTE — Progress Notes (Signed)
Physical Therapy Session Note  Patient Details  Name: Cynthia [name] MRN: [id number] Date of Birth: Jan 23, 1946  Today's Date: 01/27/2022 PT Individual Time: 9211-9417 PT Individual Time Calculation (min): 73 min   Short Term Goals: Week 2:  PT Short Term Goal 1 (Week 2): Patient will complete supine <> sit with MinA consistently and LRAD PT Short Term Goal 2 (Week 2): Patient will compelte bed <> wc transfer with ModA consistently PT Short Term Goal 3 (Week 2): Patient will propel wc >63f with no more than ModA  Skilled Therapeutic Interventions/Progress Updates:    Patient received sitting up in recliner, agreeable to PT. She denies pain, but endorses fatigue. PT discussing fall risk with patient. She reports thinking that she "may need" some help upon d/c home. Patient confirming that she likely won't have sufficient physical support at home, but was unable to problem solve finding a potential solution. Patient then asking if she could have some "tools" to practice walking in her room by herself. PT re-educating patient on fall risk precautions and current level of assist for all motor tasks including ambulating. Patient appeared receptive to education. ModA squat pivot to wc. PT transporting patient in wc to therapy gym. In // bars, patient attempting to scoot forward in the wc, but unable to coordinate advancing L hip and was unaware that she had not done so. L hemibody neglect appears to be worsening over time in CIR- PT communicated this to MD previously and to RN at end of this session. Patient ambulating 2x126fin // bars with grossly MaxA and very slow processing to advance L LE. Patient with profound tendency for L posterior hip rotation and inability to maintain neutral positioning when PT placed her there. Patient also with progressing L lateral lean originating at trunk, PT questioning tone in trunk? Attempted L foot to target standing in // bars to address initiation in L hemi body-  patient with significant difficulty motor planning this task. Blocked practice sit to stand with scoot forward in chair, lean forward, push up from wc sequencing. Patient requiring max cuing to complete these steps when given 1 step at a time. Patient unable to anticipate the next step despite having just completed the task <1 min ago. Patient was able to coordinate R hemi technique wc propulsion back to her room. MaxA stand pivot back to her recliner leading L. Seatbelt alarm on, legs elevated, call light within reach.   Therapy Documentation Precautions:  Precautions Precautions: Fall Precaution Comments: L LE extensor tone Restrictions Weight Bearing Restrictions: No Other Position/Activity Restrictions: tendency to stand on toes on L foot due to extension tone/L ankle contracture    Therapy/Group: Individual Therapy  JeKaroline CaldwellPT, DPT, CBIS  01/27/2022, 7:45 AM

## 2022-01-27 NOTE — Progress Notes (Signed)
Speech Language Pathology Daily Session Note  Patient Details  Name: Cynthia [name] MRN: [id number] Date of Birth: October 29, 1945  Today's Date: 01/27/2022 SLP Individual Time: 8309-4076 SLP Individual Time Calculation (min): 63 min  Short Term Goals: Week 2: SLP Short Term Goal 1 (Week 2): Pt will demonstrate sustained attention to functional tasks for 15-20 minutes with min A verbal cues. - Sustained attention to functional task re: use of daily schedule for 10 minutes with Min verbal A.  SLP Short Term Goal 2 (Week 2): Pt will demonstrate basic problem solving skills with min A verbal cues. - Mod verbal and visual A for verbal sequencing and problem-solving during functional transfer from bed to w/c, self-dressing, and reading/interpreting daily schedule. Max A for utilizing call bell to request assistance with changing brief (brief soaked and wet through to her sheets).  SLP Short Term Goal 3 (Week 2): Pt will self-monitor and self-correct errors in functional tasks with mod A verbal cues. - Max A to self-correct and self-monitor during functional tasks (transfer and dressing).  SLP Short Term Goal 4 (Week 2): Pt will demonstrate recall of daily, novel information with min A verbal cues for external aids. - Pt recalled d/c date with use of external aid with Mod I. Total A to write information in memory notebook.   Skilled Therapeutic Interventions: Pt seen this date for skilled ST intervention targeting cognitive-linguistic goals outlined above. Upon SLP arrival, pt alert/awake and sitting upright in bed. Pt smelled of urine and was soaked upon further inspection of brief; Max A to utilize call bell to request assistance with changing brief. NT assisted SLP with changing brief and SLP provided Max A for donning bra, Mod A for donning shirt, and Total A for donning pants and shoes for time management. Verbal sequencing and problem-solving addressed during dressing task; please see above for  details. Agreeable to ST intervention.  SLP facilitated today's session by providing reinforcement re: use of external aids to include calendar, whiteboard/communication board in her hospital room, and memory notebook to promote accurate recall of daily events, Mod A for completion of functional problem-solving with daily schedule and verbal sequencing for dressing and transfers, as well as education re: the importance of self-advocacy and utilizing call bell to request assistance when needed, particularly when she feels she has wet or soiled her brief. Additionally, d/w pt the ramifications for prolonged sitting in soaked brief to improve awareness and importance for self-advocacy; she verbalized understanding though will require reinforcement. Please see above for objective data re: pt's performance on short-term goals.  Pt left in w/c with safety belt donned, call bell reviewed and within reach, and all immediate needs met. Continue per current ST POC.  Pain No pain reported; NAD appreciated   Therapy/Group: Individual Therapy  Federico Maiorino A Lorelai Huyser 01/27/2022, 11:58 AM

## 2022-01-27 NOTE — Progress Notes (Signed)
PROGRESS NOTE   Subjective/Complaints: No new concerns or complaints this AM. Repots eating well.   Review of Systems  Constitutional:  Negative for chills and fever.  Respiratory:  Negative for cough and shortness of breath.   Cardiovascular:  Negative for chest pain and palpitations.  Gastrointestinal:  Negative for abdominal pain, constipation, nausea and vomiting.    Objective:   No results found. Recent Labs    01/25/22 0702  WBC 7.4  HGB 11.7*  HCT 34.0*  PLT 321    Recent Labs    01/25/22 0702  NA 140  K 3.9  CL 105  CO2 30  GLUCOSE 90  BUN 16  CREATININE 0.73  CALCIUM 8.9     Intake/Output Summary (Last 24 hours) at 01/27/2022 0730 Last data filed at 01/26/2022 1850 Gross per 24 hour  Intake 552 ml  Output --  Net 552 ml         Physical Exam: Vital Signs Blood pressure 126/65, pulse 86, temperature 98.2 F (36.8 C), resp. rate 18, height '5\' 6"'$  (1.676 m), weight 61.4 kg, SpO2 95 %.    Assessment/Plan: 1. Functional deficits which require 3+ hours per day of interdisciplinary therapy in a comprehensive inpatient rehab setting. Physiatrist is providing close team supervision and 24 hour management of active medical problems listed below. Physiatrist and rehab team continue to assess barriers to discharge/monitor patient progress toward functional and medical goals  Care Tool:  Bathing    Body parts bathed by patient: Right arm, Left arm, Chest, Abdomen, Front perineal area, Right upper leg, Left upper leg, Face   Body parts bathed by helper: Right lower leg, Left lower leg, Buttocks     Bathing assist Assist Level: Moderate Assistance - Patient 50 - 74%     Upper Body Dressing/Undressing Upper body dressing   What is the patient wearing?: Pull over shirt    Upper body assist Assist Level: Moderate Assistance - Patient 50 - 74%    Lower Body Dressing/Undressing Lower body  dressing      What is the patient wearing?: Pants, Incontinence brief     Lower body assist Assist for lower body dressing: Maximal Assistance - Patient 25 - 49%     Toileting Toileting    Toileting assist Assist for toileting: Total Assistance - Patient < 25%     Transfers Chair/bed transfer  Transfers assist     Chair/bed transfer assist level: Moderate Assistance - Patient 50 - 74%     Locomotion Ambulation   Ambulation assist      Assist level: 2 helpers Assistive device: Hand held assist Max distance: 5   Walk 10 feet activity   Assist  Walk 10 feet activity did not occur: Safety/medical concerns        Walk 50 feet activity   Assist Walk 50 feet with 2 turns activity did not occur: Safety/medical concerns         Walk 150 feet activity   Assist Walk 150 feet activity did not occur: Safety/medical concerns         Walk 10 feet on uneven surface  activity   Assist Walk 10 feet on uneven  surfaces activity did not occur: Safety/medical concerns         Wheelchair     Assist Is the patient using a wheelchair?: Yes Type of Wheelchair: Manual    Wheelchair assist level: Total Assistance - Patient < 25% Max wheelchair distance: 150    Wheelchair 50 feet with 2 turns activity    Assist        Assist Level: Total Assistance - Patient < 25%   Wheelchair 150 feet activity     Assist      Assist Level: Total Assistance - Patient < 25%   Blood pressure 126/65, pulse 86, temperature 98.2 F (36.8 C), resp. rate 18, height '5\' 6"'$  (1.676 m), weight 61.4 kg, SpO2 95 %.    General: Alert, No apparent distress. In Bed HEENT: Head is normocephalic,  PERRLA, EOMI, oral mucosa pink and moist Neck: Supple  Heart: Reg rate and rhythm.  Chest: CTA bilaterally  Abdomen: Soft, non-tender, non-distended, bowel sounds positive. Extremities: No clubbing, cyanosis, or edema. Psych: Pt's affect is appropriate.  pleasant Skin: warm and dry Neuro:  Alert. Continued neglect of left side. Follows commands. CN 2-12 grossly intact, sensation intact in all 4 Musculoskeletal: 5/5 RUE and RLE 3-4-/5 in LUE L HF 4-/5, Knee extension 4/5, Ankle FP/DF 3/5 No ataxia or dysmetria noted  Medical Problem List and Plan: 1. Functional deficits secondary to intraparenchymal hematoma in the right rontal lobe             -patient may  shower             -ELOS/Goals: 12-14 days min A CGA, expected dc 5/31  -Completed gait training for 30 ft             -Continue CIR for PT, OT and SLP  -With OT needing more help with open chain type activities  -CT head after fall 5/15 without acute changes, discussed fall prevention 2.  Antithrombotics: -DVT/anticoagulation:  Mechanical: Antiembolism stockings, thigh (TED hose) Bilateral lower extremities             No Lovenoxetc due to IPH             -antiplatelet therapy: N/A 3. Pain Management: 4. Mood: Effexor 75 mg daily             -antipsychotic agents: N/A  -5/11 Good mood today, continue effexor '75mg'$   -5/18 Denies depression, reports mood stable and good overall 5. Neuropsych: This patient is capable of making decisions on her own behalf. 6. Skin/Wound Care: Routine skin check 7. Fluids/Electrolytes/Nutrition: Routine in and out with follow-up chemistries  -5/11 midline removed yesterday 8.  Seizure disorder- new onset since IPH-.  Keppra 1000 mg every 12 hours 9.  Hypertension.  Cozaar 100 mg daily.  Monitor with increased mobility  Well controlled, 126/65 this AM, continue cozaar 10.  History of left breast cancer with radioactive seed localization 08/28/2020.  Follow-up outpatient 11. Hx of R frontal ICH 2017 12. Poor appetite- might need supplements or appetite stimulants  -5/10 albumin a little low, encourage PO intake  -5/17 Eating 100 Percent of meals 13. Diarrhea   -5/17 resolved  -5/18 BM yesterday, continue monitor  LOS: 8 days A FACE TO FACE  EVALUATION WAS PERFORMED  Jennye Boroughs 01/27/2022, 7:30 AM

## 2022-01-27 NOTE — Progress Notes (Signed)
Occupational Therapy Session Note  Patient Details  Name: Cynthia [name] MRN: [id number] Date of Birth: 03-Aug-1946  Today's Date: 01/27/2022 OT Individual Time: 1100-1220 OT Individual Time Calculation (min): 80 min    Short Term Goals: Week 1:  OT Short Term Goal 1 (Week 1): Pt will complete toilet transfer with max A of 1 to BSC to promote OOB toileting OT Short Term Goal 1 - Progress (Week 1): Met OT Short Term Goal 2 (Week 1): Pt will complete LB dressing mod A at the sit > stand level with AE PRN OT Short Term Goal 2 - Progress (Week 1): Met OT Short Term Goal 3 (Week 1): Pt will maintain dynamic sitting balance with supervision during ADL task OT Short Term Goal 3 - Progress (Week 1): Progressing toward goal Week 2:  OT Short Term Goal 1 (Week 2): Pt will be able to transfer to toilet with mod A. OT Short Term Goal 2 (Week 2): Pt will be able to stand at toilet with mod A during clothing management. OT Short Term Goal 3 (Week 2): Pt will demonstrate improve dual tasking during shower to maintain her balance while washing UB. OT Short Term Goal 4 (Week 2): Pt will demonstrate improved L side awareness to don shirt with only 3 cues or less.  Skilled Therapeutic Interventions/Progress Updates:    Patient seen for OT services this AM to improve neuromuscular re-education during functional task performance with BADL related task.  The pt was able to engage bilateral hand activity for resistive exercises to improve bilateral hand activity.  The pt completed resistive BUE exercises using the 1lb dowel  in various planes, she was  met with challenges in cognitive processing in relation to simple commands for short- term memory for grasping and releasing the dowel and engaging in resistive exercises. The pt completed sit to stand using the RW for balance, the pt presents presented with a  posterior lean resulting in constant vc's for adjusting her position to prevent a fall, the pt was ModA for  sit to stand using the RW.  The pt tolerated PROM of BUE to improve tone for active engagement in BADL related task performance.  The pr transfer to the recliner with her call light and bed side table within reach, the pt's alarm was activated as well.  All additional needs were addressed prior to exiting the room.   Therapy Documentation Precautions:  Precautions Precautions: Fall Precaution Comments: L LE extensor tone Restrictions Weight Bearing Restrictions: No Other Position/Activity Restrictions: tendency to stand on toes on L foot due to extension tone/L ankle contracture  Therapy/Group: Individual Therapy  Yvonne Kendall 01/27/2022, 12:41 PM

## 2022-01-28 MED ORDER — SULFAMETHOXAZOLE-TRIMETHOPRIM 800-160 MG PO TABS
1.0000 | ORAL_TABLET | Freq: Two times a day (BID) | ORAL | Status: AC
  Administered 2022-01-28 – 2022-01-30 (×6): 1 via ORAL
  Filled 2022-01-28 (×6): qty 1

## 2022-01-28 NOTE — Progress Notes (Signed)
Speech Language Pathology Daily Session Note  Patient Details  Name: Cynthia [name] MRN: [id number] Date of Birth: September 16, 1945  Today's Date: 01/28/2022 SLP Individual Time: 1000-1030 SLP Individual Time Calculation (min): 30 min  Short Term Goals: Week 2: SLP Short Term Goal 1 (Week 2): Pt will demonstrate sustained attention to functional tasks for 15-20 minutes with min A verbal cues. SLP Short Term Goal 2 (Week 2): Pt will demonstrate basic problem solving skills with min A verbal cues. SLP Short Term Goal 3 (Week 2): Pt will self-monitor and self-correct errors in functional tasks with mod A verbal cues. SLP Short Term Goal 4 (Week 2): Pt will demonstrate recall of daily, novel information with min A verbal cues for external aids.  Skilled Therapeutic Interventions: Skilled ST treatment focused on cognitive goals. Pt received upright in wheelchair on arrival. Pt recalled events from previous therapy sessions with sup A visual cues using memory notebook. Pt expressed disappointment for not progressing as fast as she would like and feeling like it's her "fault." SLP provided encouragement and education on nature of recovery/progression s/p CVA through various analogies. Pt appeared motivated by this and expressed "I just need to give myself time to heal." SLP facilitated additional education on internal/external memory strategies in which pt recalled up to 2 strategies (write it down, repeat) following brief delay with min A verbal cues. Pt sustained attention to functional tasks for 20 minute duration with min A verbal cues. Patient was left in wheelchair with alarm activated and immediate needs within reach at end of session. Continue per current plan of care.      Pain Pain Assessment Pain Scale: 0-10 Pain Score: 0-No pain  Therapy/Group: Individual Therapy  Patty Sermons 01/28/2022, 12:31 PM

## 2022-01-28 NOTE — Progress Notes (Signed)
Physical Therapy Session Note  Patient Details  Name: Cynthia [name] MRN: [id number] Date of Birth: 04/20/1946  Today's Date: 01/28/2022 PT Individual Time: 0900-0945 PT Individual Time Calculation (min): 45 min   Short Term Goals: Week 2:  PT Short Term Goal 1 (Week 2): Patient will complete supine <> sit with MinA consistently and LRAD PT Short Term Goal 2 (Week 2): Patient will compelte bed <> wc transfer with ModA consistently PT Short Term Goal 3 (Week 2): Patient will propel wc >53f with no more than ModA  Skilled Therapeutic Interventions/Progress Updates:    Patient received sitting up in wc, agreeable to PT. She denies pain. PT transporting patient in wc to therapy gym for time management and energy conservation. Patient requiring up to [place] come to stand due to poor motor planning and strong posterior L bias. Once patient began ambulating with HHA + wc follow, she progressed to light MinA with intermittent verbal cues to limit L lateral lean. Patient still with difficulty achieving L heel contact, but was able to verbalize that her walking felt "awkward" and identify that her heel wasn't making contact. mPatient ambulating ~2041fand then ~15094fith HHA, initially MaxA + wc follow progressing to light MinA with wc follow. Blocked practice sit <> stand with grossly MaxA + step by step verbal cues and sequencing.  Patient noted to have relatively poor working memory during this task- asking what we were doing partway through blocked practice, despite having already completed ~3 sets of the same task. Anterior ball roll out completed for emphasis on anterior weight shift. Poor carryover noted to blocked practice sit <> stand as patient still remains with very strong posterior L bias. Patient returning to room in wc, seatbelt alarm on, call light within reach.    Therapy Documentation Precautions:  Precautions Precautions: Fall Precaution Comments: L LE extensor  tone Restrictions Weight Bearing Restrictions: No Other Position/Activity Restrictions: tendency to stand on toes on L foot due to extension tone/L ankle contracture    Therapy/Group: Individual Therapy  JenKaroline CaldwellT, DPT, CBIS  01/28/2022, 7:48 AM

## 2022-01-28 NOTE — Progress Notes (Signed)
PROGRESS NOTE   Subjective/Complaints: Reports she feels well. No concerns this AM. Ate all her breakfast.   Review of Systems  Constitutional:  Negative for chills and fever.  Respiratory:  Negative for cough and shortness of breath.   Cardiovascular:  Negative for chest pain.  Gastrointestinal:  Negative for abdominal pain, constipation, diarrhea, nausea and vomiting.  Neurological:  Negative for headaches.    Objective:   No results found. Recent Labs    01/25/22 0702  WBC 7.4  HGB 11.7*  HCT 34.0*  PLT 321    Recent Labs    01/25/22 0702  NA 140  K 3.9  CL 105  CO2 30  GLUCOSE 90  BUN 16  CREATININE 0.73  CALCIUM 8.9     Intake/Output Summary (Last 24 hours) at 01/28/2022 0701 Last data filed at 01/28/2022 0600 Gross per 24 hour  Intake 170 ml  Output 600 ml  Net -430 ml         Physical Exam: Vital Signs Blood pressure 113/67, pulse 75, temperature 97.7 F (36.5 C), resp. rate 18, height '5\' 6"'$  (1.676 m), weight 61.4 kg, SpO2 95 %.    Assessment/Plan: 1. Functional deficits which require 3+ hours per day of interdisciplinary therapy in a comprehensive inpatient rehab setting. Physiatrist is providing close team supervision and 24 hour management of active medical problems listed below. Physiatrist and rehab team continue to assess barriers to discharge/monitor patient progress toward functional and medical goals  Care Tool:  Bathing    Body parts bathed by patient: Right arm, Left arm, Chest, Abdomen, Front perineal area, Right upper leg, Left upper leg, Face   Body parts bathed by helper: Right lower leg, Left lower leg, Buttocks     Bathing assist Assist Level: Moderate Assistance - Patient 50 - 74%     Upper Body Dressing/Undressing Upper body dressing   What is the patient wearing?: Pull over shirt    Upper body assist Assist Level: Moderate Assistance - Patient 50 - 74%     Lower Body Dressing/Undressing Lower body dressing      What is the patient wearing?: Pants, Incontinence brief     Lower body assist Assist for lower body dressing: Maximal Assistance - Patient 25 - 49%     Toileting Toileting    Toileting assist Assist for toileting: Total Assistance - Patient < 25%     Transfers Chair/bed transfer  Transfers assist     Chair/bed transfer assist level: Moderate Assistance - Patient 50 - 74%     Locomotion Ambulation   Ambulation assist      Assist level: 2 helpers Assistive device: Hand held assist Max distance: 5   Walk 10 feet activity   Assist  Walk 10 feet activity did not occur: Safety/medical concerns        Walk 50 feet activity   Assist Walk 50 feet with 2 turns activity did not occur: Safety/medical concerns         Walk 150 feet activity   Assist Walk 150 feet activity did not occur: Safety/medical concerns         Walk 10 feet on uneven surface  activity  Assist Walk 10 feet on uneven surfaces activity did not occur: Safety/medical concerns         Wheelchair     Assist Is the patient using a wheelchair?: Yes Type of Wheelchair: Manual    Wheelchair assist level: Total Assistance - Patient < 25% Max wheelchair distance: 150    Wheelchair 50 feet with 2 turns activity    Assist        Assist Level: Total Assistance - Patient < 25%   Wheelchair 150 feet activity     Assist      Assist Level: Total Assistance - Patient < 25%   Blood pressure 113/67, pulse 75, temperature 97.7 F (36.5 C), resp. rate 18, height '5\' 6"'$  (1.676 m), weight 61.4 kg, SpO2 95 %.    General: Alert, No apparent distress. In Bed HEENT: Head is normocephalic,  PERRLA, EOMI, oral mucosa pink and moist Neck: Supple  Heart: Reg rate and rhythm.  Chest: CTA bilaterally , normal effort Abdomen: Soft, non-tender, non-distended, bowel sounds positive. Extremities: No clubbing, cyanosis,  or edema. Psych: Pt's affect is appropriate. pleasant Skin: warm and dry Neuro:  Alert. Continued neglect of left side. Follows commands. CN 2-12 grossly intact, sensation intact in all 4 Musculoskeletal: 5/5 RUE and RLE 3-4-/5 in LUE L HF 4-/5, Knee extension 4/5, Ankle FP/DF 3/5 No ataxia or dysmetria noted No joint swelling  Medical Problem List and Plan: 1. Functional deficits secondary to intraparenchymal hematoma in the right rontal lobe             -patient may  shower             -ELOS/Goals: 12-14 days min A CGA, expected dc 5/31             -Continue CIR for PT, OT and SLP  -Continues to have difficulty with carryover   -CT head after fall 5/15 without acute changes, discussed fall prevention 2.  Antithrombotics: -DVT/anticoagulation:  Mechanical: Antiembolism stockings, thigh (TED hose) Bilateral lower extremities             No Lovenoxetc due to IPH             -antiplatelet therapy: N/A 3. Pain Management: 4. Mood: Effexor 75 mg daily             -antipsychotic agents: N/A  -5/11 Good mood today, continue effexor '75mg'$   -5/18 Denies depression, reports mood stable and good overall 5. Neuropsych: This patient is capable of making decisions on her own behalf. 6. Skin/Wound Care: Routine skin check 7. Fluids/Electrolytes/Nutrition: Routine in and out with follow-up chemistries  -5/11 midline removed yesterday 8.  Seizure disorder- new onset since IPH-.  Keppra 1000 mg every 12 hours 9.  Hypertension.  Cozaar 100 mg daily.  Monitor with increased mobility  Well controlled, continue cozaar 10.  History of left breast cancer with radioactive seed localization 08/28/2020.  Follow-up outpatient 11. Hx of R frontal ICH 2017 12. Poor appetite- might need supplements or appetite stimulants  -5/10 albumin a little low, encourage PO intake  -Ate all her breakfast this AM 13. Diarrhea   -5/17 resolved  -5/18 BM yesterday, continue monitor  LOS: 9 days A FACE TO FACE  EVALUATION WAS PERFORMED  Jennye Boroughs 01/28/2022, 7:01 AM

## 2022-01-28 NOTE — Progress Notes (Signed)
Physical Therapy Session Note  Patient Details  Name: Cynthia [name] MRN: [id number] Date of Birth: 01-08-1946  Today's Date: 01/28/2022 PT Individual Time: 0730-0755 PT Individual Time Calculation (min): 25 min   Short Term Goals: Week 1:  PT Short Term Goal 1 (Week 1): Patient will complete supine <> sit with MinA consistently PT Short Term Goal 1 - Progress (Week 1): Progressing toward goal PT Short Term Goal 2 (Week 1): Patient will complete sit <> stand with LRAD and ModA consistently PT Short Term Goal 2 - Progress (Week 1): Met PT Short Term Goal 3 (Week 1): Patient will ambulate at least 43f with LRAD and ModA + wc follow as needed PT Short Term Goal 3 - Progress (Week 1): Met PT Short Term Goal 4 (Week 1): Patient will initiate stair training PT Short Term Goal 4 - Progress (Week 1): Met Week 2:  PT Short Term Goal 1 (Week 2): Patient will complete supine <> sit with MinA consistently and LRAD PT Short Term Goal 2 (Week 2): Patient will compelte bed <> wc transfer with ModA consistently PT Short Term Goal 3 (Week 2): Patient will propel wc >133fwith no more than ModA  Skilled Therapeutic Interventions/Progress Updates:   Checked in with RN prior to entering room due to sign on door - RN reported pt with confirmed UTI. Received pt semi-reclined in bed, pt agreeable to PT treatment, and did not report any pain during session. Session with emphasis on functional mobility/transfers, toileting, and dynamic standing balance. Pt reported urge to void and transferred semi-reclined<>sitting EOB with HOB elevated and heavy min A for LLE management. Initially attempted stand<>pivot to bedside commode to L, however pt unable to advance LLE and pushing hard posteriorly. Instead transferred to R with pt holding onto therapist's arms with max A and required +2 assist for clothing management. With increased time, pt able to void and performed peri-care with supervision. Donned clean brief and  pants in sitting with max A and transferred stand<>pivot commode<>WC, with max A (+2 for clothing management) and manual facilitation at pelvis to correct posterior lean. Of note, pt with L inattention, completley unaware that LUE was hanging off WC. Concluded session with pt sitting in WC, needs within reach, and seatbelt alarm on. Telesitter in place and memory notebook updated.   Therapy Documentation Precautions:  Precautions Precautions: Fall Precaution Comments: L LE extensor tone Restrictions Weight Bearing Restrictions: No Other Position/Activity Restrictions: tendency to stand on toes on L foot due to extension tone/L ankle contracture  Therapy/Group: Individual Therapy AnAlfonse AlpersT, DPT  01/28/2022, 7:22 AM

## 2022-01-28 NOTE — Progress Notes (Signed)
Occupational Therapy Session Note  Patient Details  Name: Cynthia [name] MRN: [id number] Date of Birth: 29-Jun-1946  Today's Date: 01/28/2022 OT Individual Time: 1107-1200 OT Individual Time Calculation (min): 53 min    Short Term Goals: Week 2:  OT Short Term Goal 1 (Week 2): Pt will be able to transfer to toilet with mod A. OT Short Term Goal 2 (Week 2): Pt will be able to stand at toilet with mod A during clothing management. OT Short Term Goal 3 (Week 2): Pt will demonstrate improve dual tasking during shower to maintain her balance while washing UB. OT Short Term Goal 4 (Week 2): Pt will demonstrate improved L side awareness to don shirt with only 3 cues or less.  Skilled Therapeutic Interventions/Progress Updates:  Pt greeted seated in w/c  agreeable to OT intervention. Session focus on sit>stands, sitting/standing balance,  functional mobility, dynamic standing balance and decreasing overall caregiver burden.  Pt transported to gym with total A where pt completed squat pivot to EOM with MOD A+2. Worked on lateral weight shifts as precursor to sit>stands from EOM with pt able to stand with cue of "reach forward for clothespin on mirror" with MODA +1. Pt continues to present with what appears to be extensor tone in LLE with pt having difficulty keeping LLE on floor in standing. Worked on NMR in LLE with pt standing on block on RLE forcing pt to WB in LLE in standing, had pt reach forward to mirror to place numbers on mirror in pattern of clock to work on anterior weight shifting. Pt needed MAX cues for upright posture and anterior weight shift in standing, used mirror as well to provide visual feedback. Pt also able to complete stand pivot with MOD A +1 but MAX multimodal cues for foot placement/ motor planning.   Pt transported back to room with total A where pt left seated in w/c with alarm belt activated and all needs within reach.                 Therapy Documentation Precautions:   Precautions Precautions: Fall Precaution Comments: L LE extensor tone Restrictions Weight Bearing Restrictions: No Other Position/Activity Restrictions: tendency to stand on toes on L foot due to extension tone/L ankle contracture General: General OT Amount of Missed Time: 7 Minutes    Pain: no pain     Therapy/Group: Individual Therapy  Precious Haws 01/28/2022, 12:26 PM

## 2022-01-29 NOTE — Progress Notes (Signed)
Speech Language Pathology Daily Session Note  Patient Details  Name: Cynthia [name] MRN: [id number] Date of Birth: [birth year]  Today's Date: 01/29/2022 SLP Individual Time: 1001-1100 SLP Individual Time Calculation (min): 59 min  Short Term Goals: Week 2: SLP Short Term Goal 1 (Week 2): Pt will demonstrate sustained attention to functional tasks for 15-20 minutes with min A verbal cues. SLP Short Term Goal 2 (Week 2): Pt will demonstrate basic problem solving skills with min A verbal cues. SLP Short Term Goal 3 (Week 2): Pt will self-monitor and self-correct errors in functional tasks with mod A verbal cues. SLP Short Term Goal 4 (Week 2): Pt will demonstrate recall of daily, novel information with min A verbal cues for external aids.  Skilled Therapeutic Interventions: Pt seen for skilled ST with focus on cognitive goals, pt in bed and agreeable to therapeutic tasks. SLP facilitating basic problem solving skills for home environment by providing overall min A cues this date, continues to demonstrate decreased insight into amount of assist she will need at discharge. Pt discussing recent cognitive changes, reporting a significant and lingering memory impairment following Lymes disease complicated by her dog causing a fall with significant head impact and recent life stressors (husband's passing). SLP and pt discussing compensatory strategies for functional recall which patient was able to carryover with mod A cues. Pt states she was often "losing things" at home such as her wallet. Pt able to recall 2/5 home medications independently, min cues to increase to 5/5. Pt provided written medication list and participated in home med management task with min A cues for accuracy. Pt left in bed with alarm set and all needs within reach and telesitter present, cont ST POC.  Pain Pain Assessment Pain Scale: 0-10 Pain Score: 0-No pain  Therapy/Group: Individual Therapy  [name] 01/29/2022,  11:35 AM

## 2022-01-29 NOTE — Plan of Care (Signed)
  Problem: Consults Goal: RH BRAIN INJURY PATIENT EDUCATION Description: Description: See Patient Education module for eduction specifics Outcome: Progressing   Problem: RH BOWEL ELIMINATION Goal: RH STG MANAGE BOWEL WITH ASSISTANCE Description: STG Manage Bowel with mod I  Assistance. Outcome: Progressing Goal: RH STG MANAGE BOWEL W/MEDICATION W/ASSISTANCE Description: STG Manage Bowel with Medication with mod I Assistance. Outcome: Progressing   Problem: RH BLADDER ELIMINATION Goal: RH STG MANAGE BLADDER WITH ASSISTANCE Description: STG Manage Bladder With  toileting Assistance Outcome: Progressing   Problem: RH SAFETY Goal: RH STG ADHERE TO SAFETY PRECAUTIONS W/ASSISTANCE/DEVICE Description: STG Adhere to Safety Precautions With cues Assistance/Device. Outcome: Progressing   Problem: RH PAIN MANAGEMENT Goal: RH STG PAIN MANAGED AT OR BELOW PT'S PAIN GOAL Description: At or below level 4 with prns Outcome: Progressing   Problem: RH KNOWLEDGE DEFICIT BRAIN INJURY Goal: RH STG INCREASE KNOWLEDGE OF SELF CARE AFTER BRAIN INJURY Description: Patient and son will be able to manage care at discharge using handouts and educational tools independently Outcome: Progressing   Problem: RH Vision Goal: RH LTG Vision (Specify) Outcome: Progressing   Problem: RH KNOWLEDGE DEFICIT Goal: RH STG INCREASE KNOWLEDGE OF HYPERTENSION Description: Patient and son will be able to manage HTN with medications and dietary modifications using handouts and educational tools independently Outcome: Progressing

## 2022-01-29 NOTE — Progress Notes (Signed)
PROGRESS NOTE   Subjective/Complaints:  No overnight concerns.  Pt feels well slept well and ate her breakfast.    Review of Systems  Constitutional:  Negative for chills, fever and malaise/fatigue.  HENT: Negative.    Eyes: Negative.   Respiratory:  Negative for cough, shortness of breath and wheezing.   Cardiovascular:  Negative for chest pain and palpitations.  Gastrointestinal:  Negative for abdominal pain, constipation, diarrhea, nausea and vomiting.  Genitourinary:  Negative for dysuria, frequency and urgency.       Patient denies dysuria, or urinary frequency but reports recent urinary incontinence episode.  Musculoskeletal: Negative.   Skin: Negative.   Neurological:  Negative for dizziness and headaches.       Some confusion, needs directions to be given slowly.  Endo/Heme/Allergies: Negative.   Psychiatric/Behavioral:         Some impulsivity, on tele-sitter    Objective:   No results found. No results for input(s): WBC, HGB, HCT, PLT in the last 72 hours.  No results for input(s): NA, K, CL, CO2, GLUCOSE, BUN, CREATININE, CALCIUM in the last 72 hours.   Intake/Output Summary (Last 24 hours) at 01/29/2022 1141 Last data filed at 01/29/2022 0917 Gross per 24 hour  Intake 352 ml  Output --  Net 352 ml         Physical Exam: Vital Signs Blood pressure (!) 108/53, pulse 72, temperature 97.8 F (36.6 C), temperature source Oral, resp. rate 18, height '5\' 6"'$  (1.676 m), weight 61.4 kg, SpO2 96 %.    Assessment/Plan: 1. Functional deficits which require 3+ hours per day of interdisciplinary therapy in a comprehensive inpatient rehab setting. Physiatrist is providing close team supervision and 24 hour management of active medical problems listed below. Physiatrist and rehab team continue to assess barriers to discharge/monitor patient progress toward functional and medical goals  Care Tool:  Bathing     Body parts bathed by patient: Right arm, Left arm, Chest, Abdomen, Front perineal area, Right upper leg, Left upper leg, Face   Body parts bathed by helper: Right lower leg, Left lower leg, Buttocks     Bathing assist Assist Level: Moderate Assistance - Patient 50 - 74%     Upper Body Dressing/Undressing Upper body dressing   What is the patient wearing?: Pull over shirt    Upper body assist Assist Level: Moderate Assistance - Patient 50 - 74%    Lower Body Dressing/Undressing Lower body dressing      What is the patient wearing?: Pants, Incontinence brief     Lower body assist Assist for lower body dressing: Maximal Assistance - Patient 25 - 49%     Toileting Toileting    Toileting assist Assist for toileting: Total Assistance - Patient < 25%     Transfers Chair/bed transfer  Transfers assist     Chair/bed transfer assist level: Maximal Assistance - Patient 25 - 49%     Locomotion Ambulation   Ambulation assist      Assist level: 2 helpers Assistive device: Hand held assist Max distance: 5   Walk 10 feet activity   Assist  Walk 10 feet activity did not occur: Safety/medical concerns  Walk 50 feet activity   Assist Walk 50 feet with 2 turns activity did not occur: Safety/medical concerns         Walk 150 feet activity   Assist Walk 150 feet activity did not occur: Safety/medical concerns         Walk 10 feet on uneven surface  activity   Assist Walk 10 feet on uneven surfaces activity did not occur: Safety/medical concerns         Wheelchair     Assist Is the patient using a wheelchair?: Yes Type of Wheelchair: Manual    Wheelchair assist level: Total Assistance - Patient < 25% Max wheelchair distance: 150    Wheelchair 50 feet with 2 turns activity    Assist        Assist Level: Total Assistance - Patient < 25%   Wheelchair 150 feet activity     Assist      Assist Level: Total Assistance  - Patient < 25%   Blood pressure (!) 108/53, pulse 72, temperature 97.8 F (36.6 C), temperature source Oral, resp. rate 18, height '5\' 6"'$  (1.676 m), weight 61.4 kg, SpO2 96 %.    General: Alert, No apparent distress. Resting in bed. HEENT: Head is normocephalic,  PERRLA, EOMI, oral mucosa pink and moist Neck: Supple, soft Heart: Reg rate and rhythm. No murmurs or extra heart sounds. Chest: CTA bilaterally, normal effort Abdomen: Soft, non-tender, non-distended, bowel sounds positive. Extremities: No clubbing, cyanosis, or edema. GU: No dysuria, or urinary frequency, but some recent incontinent episodes. Psych: Pt's affect is appropriate. Pleasant. Some impulsivity at times. Skin: warm and dry Neuro:  Alert. Still has neglect of left side.  No issues with following commands. CN II-X12 grossly intact.  Sensation intact x4. Some recent episodes with confusion. Musculoskeletal: 5/5 RUE and RLE 3-4-/5 in LUE, L HF 4-/5, L Knee extension 4/5, L Ankle FP/DF 3/5 No joint swelling, no ataxia or dysmetria.  Medical Problem List and Plan: 1. Functional deficits secondary to intraparenchymal hematoma in the right rontal lobe             -patient may  shower             -ELOS/Goals: 12-14 days min A CGA, expected dc 5/31             -Continue CIR for PT, OT and SLP  -Continues to have difficulty with carryover   -CT head after fall 5/15 without acute changes, discussed fall prevention 5/20 Continue fall prevention measures. 2.  Antithrombotics: -DVT/anticoagulation:  Mechanical: Antiembolism stockings, thigh (TED hose) Bilateral lower extremities             No Lovenoxetc due to IPH             -antiplatelet therapy: N/A 3. Pain Management: 4. Mood: Effexor 75 mg daily             -antipsychotic agents: N/A  -5/11 Good mood today, continue effexor '75mg'$   -5/18 Denies depression, reports mood stable and good overall 5. Neuropsych: This patient is capable of making decisions on her own  behalf. 6. Skin/Wound Care: Routine skin check 7. Fluids/Electrolytes/Nutrition: Routine in and out with follow-up chemistries  -5/11 midline removed yesterday 8.  Seizure disorder- new onset since IPH-.  Keppra 1000 mg every 12 hours 9.  Hypertension.  Cozaar 100 mg daily.  Monitor with increased mobility  Well controlled, continue cozaar            -5/20 Continue  Cozaar 100 mg daily    01/29/2022    4:30 AM 01/28/2022    8:06 PM 01/28/2022    2:44 PM  Vitals with BMI  Systolic [phone]  Diastolic 53 59 68  Pulse 72 81 84    10.  History of left breast cancer with radioactive seed localization 08/28/2020.  Follow-up outpatient 11. Hx of R frontal ICH 2017 12. Poor appetite- might need supplements or appetite stimulants  -5/10 albumin a little low, encourage PO intake  -Ate all her breakfast this AM             -5/20 No issues this morning, ate breakfast 13. Diarrhea   -5/17 resolved  -5/18 BM yesterday, continue monitor             -5/20 BM on 5/17, CTM 14. UTI    -UA on 5/18: + leuks, + nitrite, many bacteria, culture pending.     -Pt started on Bactrim DS on 01/28/22.      -5/20 Pt denies urinary symptoms but is on tele-sitter for confusion. Patient denies dysuria, or urinary frequency but said that that she has an episode of incontinence before her urine was tested (U/A).  LOS: 10 days A FACE TO FACE EVALUATION WAS PERFORMED  Luetta Nutting 01/29/2022, 11:41 AM

## 2022-01-30 LAB — URINE CULTURE: Culture: >=100000 — AB

## 2022-01-30 NOTE — Progress Notes (Signed)
PROGRESS NOTE   Subjective/Complaints:  No overnight concerns.  Pt feels well slept well and ate her breakfast.    Review of Systems  Constitutional:  Negative for chills, fever and malaise/fatigue.  HENT: Negative.    Eyes: Negative.   Respiratory:  Negative for cough, shortness of breath and wheezing.   Cardiovascular:  Negative for chest pain and palpitations.  Gastrointestinal:  Negative for abdominal pain, constipation, diarrhea, nausea and vomiting.  Genitourinary:  Negative for dysuria, frequency and urgency.       Patient denies dysuria, or urinary frequency but reports recent urinary incontinence episode.  Musculoskeletal: Negative.   Skin: Negative.   Neurological:  Negative for dizziness and headaches.       Confusion at times.  Able to answer questions appropriately today.  Endo/Heme/Allergies: Negative.   Psychiatric/Behavioral:         Some impulsivity, on tele-sitter    Objective:   No results found. No results for input(s): WBC, HGB, HCT, PLT in the last 72 hours.  No results for input(s): NA, K, CL, CO2, GLUCOSE, BUN, CREATININE, CALCIUM in the last 72 hours.  No intake or output data in the 24 hours ending 01/30/22 0928       Physical Exam: Vital Signs Blood pressure 121/70, pulse 76, temperature 98.5 F (36.9 C), temperature source Oral, resp. rate 18, height '5\' 6"'$  (1.676 m), weight 61.4 kg, SpO2 96 %.    Assessment/Plan: 1. Functional deficits which require 3+ hours per day of interdisciplinary therapy in a comprehensive inpatient rehab setting. Physiatrist is providing close team supervision and 24 hour management of active medical problems listed below. Physiatrist and rehab team continue to assess barriers to discharge/monitor patient progress toward functional and medical goals  Care Tool:  Bathing    Body parts bathed by patient: Right arm, Left arm, Chest, Abdomen, Front perineal  area, Right upper leg, Left upper leg, Face   Body parts bathed by helper: Right lower leg, Left lower leg, Buttocks     Bathing assist Assist Level: Moderate Assistance - Patient 50 - 74%     Upper Body Dressing/Undressing Upper body dressing   What is the patient wearing?: Pull over shirt    Upper body assist Assist Level: Moderate Assistance - Patient 50 - 74%    Lower Body Dressing/Undressing Lower body dressing      What is the patient wearing?: Pants, Incontinence brief     Lower body assist Assist for lower body dressing: Maximal Assistance - Patient 25 - 49%     Toileting Toileting    Toileting assist Assist for toileting: Total Assistance - Patient < 25%     Transfers Chair/bed transfer  Transfers assist     Chair/bed transfer assist level: Maximal Assistance - Patient 25 - 49%     Locomotion Ambulation   Ambulation assist      Assist level: 2 helpers Assistive device: Hand held assist Max distance: 5   Walk 10 feet activity   Assist  Walk 10 feet activity did not occur: Safety/medical concerns        Walk 50 feet activity   Assist Walk 50 feet with 2 turns activity  did not occur: Safety/medical concerns         Walk 150 feet activity   Assist Walk 150 feet activity did not occur: Safety/medical concerns         Walk 10 feet on uneven surface  activity   Assist Walk 10 feet on uneven surfaces activity did not occur: Safety/medical concerns         Wheelchair     Assist Is the patient using a wheelchair?: Yes Type of Wheelchair: Manual    Wheelchair assist level: Total Assistance - Patient < 25% Max wheelchair distance: 150    Wheelchair 50 feet with 2 turns activity    Assist        Assist Level: Total Assistance - Patient < 25%   Wheelchair 150 feet activity     Assist      Assist Level: Total Assistance - Patient < 25%   Blood pressure 121/70, pulse 76, temperature 98.5 F (36.9 C),  temperature source Oral, resp. rate 18, height '5\' 6"'$  (1.676 m), weight 61.4 kg, SpO2 96 %.    General: Alert, No apparent distress. Resting in bed. HEENT: Head is normocephalic,  PERRLA, EOMI, oral mucosa pink and moist Neck: Supple, soft Heart: Reg rate and rhythm. No murmurs or extra heart sounds. Chest: CTA bilaterally, normal effort Abdomen: Soft, non-tender, non-distended, bowel sounds positive. Extremities: No clubbing, cyanosis, or edema. GU: No dysuria, or urinary frequency, but some recent incontinent episodes. Psych: Pt's affect is appropriate. Pleasant. Some impulsivity at times. Skin: warm and dry Neuro:  Alert and oriented x3.  Still has neglect of left side.  No issues with following commands. CN II-X12 grossly intact.  Sensation intact x4. Some recent episodes with confusion, but appropriate responses to orientation questions today. Musculoskeletal: 5/5 RUE and RLE 3-4-/5 in LUE, L HF 4-/5, L Knee extension 4/5, L Ankle FP/DF 3/5 No joint swelling, no ataxia or dysmetria.  Medical Problem List and Plan: 1. Functional deficits secondary to intraparenchymal hematoma in the right rontal lobe             -patient may  shower             -ELOS/Goals: 12-14 days min A CGA, expected dc 5/31             -Continue CIR for PT, OT and SLP  -Continues to have difficulty with carryover   -CT head after fall 5/15 without acute changes, discussed fall prevention 5/21 Continue fall prevention measures. 2.  Antithrombotics: -DVT/anticoagulation:  Mechanical: Antiembolism stockings, thigh (TED hose) Bilateral lower extremities             No Lovenoxetc due to IPH             -antiplatelet therapy: N/A 3. Pain Management: 4. Mood: Effexor 75 mg daily             -antipsychotic agents: N/A  -5/11 Good mood today, continue effexor '75mg'$   -5/18 Denies depression, reports mood stable and good overall             5/21 Mood and affect appropriate. 5. Neuropsych: This patient is capable of  making decisions on her own behalf. 6. Skin/Wound Care: Routine skin check 7. Fluids/Electrolytes/Nutrition: Routine in and out with follow-up chemistries  -5/11 midline removed yesterday 8.  Seizure disorder- new onset since IPH-.  Keppra 1000 mg every 12 hours 9.  Hypertension.  Cozaar 100 mg daily.  Monitor with increased mobility  Well controlled, continue cozaar            -  5/20 Continue Cozaar 100 mg daily            5/21 B/p today 121/70, continue regimen    01/30/2022    5:38 AM 01/29/2022    8:03 PM 01/29/2022    2:26 PM  Vitals with BMI  Systolic [phone]  Diastolic 70 54 55  Pulse 76 97 93    10.  History of left breast cancer with radioactive seed localization 08/28/2020.  Follow-up outpatient 11. Hx of R frontal ICH 2017 12. Poor appetite- might need supplements or appetite stimulants  -5/10 albumin a little low, encourage PO intake  -Ate all her breakfast this AM             -5/20 No issues this morning, ate breakfast 13. Diarrhea   -5/17 resolved  -5/18 BM yesterday, continue monitor             -5/20 BM on 5/17, CTM             -5/21 LBM 5/20 on days (per pt) 14. UTI    -UA on 5/18: + leuks, + nitrite, many bacteria, culture pending.     -Pt started on Bactrim DS on 01/28/22.      -5/20 Pt denies urinary symptoms but is on tele-sitter for confusion. Patient denies dysuria, or urinary frequency but said that that she has an episode of incontinence before her urine was tested (U/A). 5/21 Continue Bactrim DS, no urinary symptoms today.  LOS: 11 days A FACE TO FACE EVALUATION WAS PERFORMED  Luetta Nutting 01/30/2022, 9:28 AM

## 2022-01-30 NOTE — Progress Notes (Signed)
Physical Therapy Session Note  Patient Details  Name: Cynthia [name] MRN: [id number] Date of Birth: 07-29-1946  Today's Date: 01/30/2022 PT Individual Time: 0867-6195 PT Individual Time Calculation (min): 50 min   Short Term Goals: Week 2:  PT Short Term Goal 1 (Week 2): Patient will complete supine <> sit with MinA consistently and LRAD PT Short Term Goal 2 (Week 2): Patient will compelte bed <> wc transfer with ModA consistently PT Short Term Goal 3 (Week 2): Patient will propel wc >47f with no more than ModA  Skilled Therapeutic Interventions/Progress Updates:     Patient in bed on the phone upon PT arrival. Patient requested time to complete her phone call. Started session 10 min late. Patient alert and agreeable to PT session. Patient denied pain during session.  Patient reported urinary incontinence prior to session, requested to get cleaned up.   Therapeutic Activity: Bed Mobility: Patient performed supine to sit with min A for facilitation for trunk control and reciprocal scooting to EOB. Provided verbal cues for initiation and sequencing throughout. Donned B socks and tennis shoes with total A for time/energy management with patient sitting EOB with supervision for sitting balance. Transfers: Patient performed stand pivot bed>w/c with min-mod A for L lower extremity management and forward weight shift and w/c<>BSC with min A using grab bar with max cues for sequencing and hip rotation due to motor planning L inattention deficits. She performed sit to/from stand x2 with min A using R rail . Provided verbal cues for forward weight shift.  Gait Training:  Patient ambulated 30 feet using R rail with min A and mod-min A for L limb advancement and 60 feet with B upper extremtiy support with +2 assist without assist for L limb advancement. Ambulated with forefoot at initial contact with increased L PF throughout, decreased L weight shift, forward trunk lean, and downward head gaze.  Provided verbal cues for sequencing, increased L hamstring activation in pre-swing, heel strike at initial contact, and facilitation for L weight shift for increased heel contact and blocking L knee in stance to prevent hyperextension.  Patient in w/c at end of session with breaks locked, seat belt alarm set, Telesitting in the room, and all needs within reach.   Therapy Documentation Precautions:  Precautions Precautions: Fall Precaution Comments: L LE extensor tone Restrictions Weight Bearing Restrictions: No Other Position/Activity Restrictions: tendency to stand on toes on L foot due to extension tone/L ankle contracture   Therapy/Group: Individual Therapy  Blade Scheff L Evolette Pendell PT, DPT  01/30/2022, 10:59 AM

## 2022-01-30 NOTE — Plan of Care (Signed)
  Problem: Consults Goal: RH BRAIN INJURY PATIENT EDUCATION Description: Description: See Patient Education module for eduction specifics Outcome: Progressing   Problem: RH BOWEL ELIMINATION Goal: RH STG MANAGE BOWEL WITH ASSISTANCE Description: STG Manage Bowel with mod I  Assistance. Outcome: Progressing Goal: RH STG MANAGE BOWEL W/MEDICATION W/ASSISTANCE Description: STG Manage Bowel with Medication with mod I Assistance. Outcome: Progressing   Problem: RH BLADDER ELIMINATION Goal: RH STG MANAGE BLADDER WITH ASSISTANCE Description: STG Manage Bladder With  toileting Assistance Outcome: Progressing   Problem: RH SAFETY Goal: RH STG ADHERE TO SAFETY PRECAUTIONS W/ASSISTANCE/DEVICE Description: STG Adhere to Safety Precautions With cues Assistance/Device. Outcome: Progressing   Problem: RH PAIN MANAGEMENT Goal: RH STG PAIN MANAGED AT OR BELOW PT'S PAIN GOAL Description: At or below level 4 with prns Outcome: Progressing   Problem: RH KNOWLEDGE DEFICIT BRAIN INJURY Goal: RH STG INCREASE KNOWLEDGE OF SELF CARE AFTER BRAIN INJURY Description: Patient and son will be able to manage care at discharge using handouts and educational tools independently Outcome: Progressing   Problem: RH Vision Goal: RH LTG Vision (Specify) Outcome: Progressing   Problem: RH KNOWLEDGE DEFICIT Goal: RH STG INCREASE KNOWLEDGE OF HYPERTENSION Description: Patient and son will be able to manage HTN with medications and dietary modifications using handouts and educational tools independently Outcome: Progressing

## 2022-01-31 DIAGNOSIS — N3000 Acute cystitis without hematuria: Secondary | ICD-10-CM

## 2022-01-31 NOTE — Plan of Care (Signed)
  Problem: Consults Goal: RH BRAIN INJURY PATIENT EDUCATION Description: Description: See Patient Education module for eduction specifics Outcome: Progressing   Problem: RH BOWEL ELIMINATION Goal: RH STG MANAGE BOWEL WITH ASSISTANCE Description: STG Manage Bowel with mod I  Assistance. Outcome: Progressing Goal: RH STG MANAGE BOWEL W/MEDICATION W/ASSISTANCE Description: STG Manage Bowel with Medication with mod I Assistance. Outcome: Progressing   Problem: RH BLADDER ELIMINATION Goal: RH STG MANAGE BLADDER WITH ASSISTANCE Description: STG Manage Bladder With  toileting Assistance Outcome: Progressing   Problem: RH SAFETY Goal: RH STG ADHERE TO SAFETY PRECAUTIONS W/ASSISTANCE/DEVICE Description: STG Adhere to Safety Precautions With cues Assistance/Device. Outcome: Progressing   Problem: RH PAIN MANAGEMENT Goal: RH STG PAIN MANAGED AT OR BELOW PT'S PAIN GOAL Description: At or below level 4 with prns Outcome: Progressing   Problem: RH KNOWLEDGE DEFICIT BRAIN INJURY Goal: RH STG INCREASE KNOWLEDGE OF SELF CARE AFTER BRAIN INJURY Description: Patient and son will be able to manage care at discharge using handouts and educational tools independently Outcome: Progressing   Problem: RH Vision Goal: RH LTG Vision (Specify) Outcome: Progressing   Problem: RH KNOWLEDGE DEFICIT Goal: RH STG INCREASE KNOWLEDGE OF HYPERTENSION Description: Patient and son will be able to manage HTN with medications and dietary modifications using handouts and educational tools independently Outcome: Progressing

## 2022-01-31 NOTE — Progress Notes (Signed)
Speech Language Pathology Daily Session Note  Patient Details  Name: Lamiah Marmol MRN: [id number] Date of Birth: [birth year]  Today's Date: 01/31/2022 SLP Individual Time: 3151-7616 SLP Individual Time Calculation (min): 59 min  Short Term Goals: Week 2: SLP Short Term Goal 1 (Week 2): Pt will demonstrate sustained attention to functional tasks for 15-20 minutes with min A verbal cues. - Sustained attention to functional task (organizing medication in pill box) for 15 minutes with Min A.  SLP Short Term Goal 2 (Week 2): Pt will demonstrate basic problem solving skills with min A verbal cues. - Min-Mod verbal A for basic problem-solving skills during medication management task e.g. pill organizer).  SLP Short Term Goal 3 (Week 2): Pt will self-monitor and self-correct errors in functional tasks with mod A verbal cues. - Benefited from Min-Mod verbal and visual A for self-monitoring and self-correcting errors during medication management/pill organizer task.   SLP Short Term Goal 4 (Week 2): Pt will demonstrate recall of daily, novel information with min A verbal cues for external aids. - Recalled 3 out of 4 items from breakfast tray given Sup A. Recalled OT tasks and name of OT with with Sup A for use of external aids.  Skilled Therapeutic Interventions: Pt seen this date for skilled ST intervention targeting cognitive-linguistic skills outlined above. Pt received awake/alert and OOB in w/c. Greeted SLP appropriately and amenable to ST intervention. Pt transported in w/c to speech office with Total A for time management.   SLP facilitated today's session by providing the following skilled ST interventions within the context of functional tx tasks: compensatory strategy training provided re: implementation of attention and executive functioning strategies (quiet environment, double checking for accuracy, talking herself through tasks for sequencing and motor planning purposes) within the  context of a functional iADL task (filling a pill organizer), extra processing time, providing and reviewing a current list of scheduled medications, importance of self-advocacy, use of simple language with demonstration prompts, model prompting, overall Min-Mod A for improved metacognition, and Sup A for use of memory notebook. Please see above for objective data re: pt's performance from today's session.  Pt remains stimulable for skilled ST intervention as evident by subtle improvement in recall of functional information, focused attention, and basic problem-solving for daily tasks.  Pt returned to room and left upright in w/c with all safety measures in place. Call bell reviewed and within reach. Continue per current ST POC.   Pain Pt denies pain; NAD appreciated.   Therapy/Group: Individual Therapy  Xoey Warmoth A Kamrie Fanton 01/31/2022, 1:58 PM

## 2022-01-31 NOTE — Progress Notes (Signed)
PROGRESS NOTE   Subjective/Complaints:  No new complaints or concerns. Reports she had a good weekend.   Review of Systems  Respiratory:  Negative for cough and shortness of breath.   Cardiovascular:  Negative for chest pain.  Gastrointestinal:  Negative for constipation, diarrhea, nausea and vomiting.    Objective:   No results found. No results for input(s): WBC, HGB, HCT, PLT in the last 72 hours.  No results for input(s): NA, K, CL, CO2, GLUCOSE, BUN, CREATININE, CALCIUM in the last 72 hours.   Intake/Output Summary (Last 24 hours) at 01/31/2022 0733 Last data filed at 01/30/2022 1815 Gross per 24 hour  Intake 716 ml  Output --  Net 716 ml         Physical Exam: Vital Signs Blood pressure 118/73, pulse 74, temperature 98.5 F (36.9 C), resp. rate 16, height '5\' 6"'$  (1.676 m), weight 61.4 kg, SpO2 95 %.    Assessment/Plan: 1. Functional deficits which require 3+ hours per day of interdisciplinary therapy in a comprehensive inpatient rehab setting. Physiatrist is providing close team supervision and 24 hour management of active medical problems listed below. Physiatrist and rehab team continue to assess barriers to discharge/monitor patient progress toward functional and medical goals  Care Tool:  Bathing    Body parts bathed by patient: Right arm, Left arm, Chest, Abdomen, Front perineal area, Right upper leg, Left upper leg, Face   Body parts bathed by helper: Right lower leg, Left lower leg, Buttocks     Bathing assist Assist Level: Moderate Assistance - Patient 50 - 74%     Upper Body Dressing/Undressing Upper body dressing   What is the patient wearing?: Pull over shirt    Upper body assist Assist Level: Moderate Assistance - Patient 50 - 74%    Lower Body Dressing/Undressing Lower body dressing      What is the patient wearing?: Pants, Incontinence brief     Lower body assist Assist for  lower body dressing: Maximal Assistance - Patient 25 - 49%     Toileting Toileting    Toileting assist Assist for toileting: Total Assistance - Patient < 25%     Transfers Chair/bed transfer  Transfers assist     Chair/bed transfer assist level: Maximal Assistance - Patient 25 - 49%     Locomotion Ambulation   Ambulation assist      Assist level: 2 helpers Assistive device: Hand held assist Max distance: 5   Walk 10 feet activity   Assist  Walk 10 feet activity did not occur: Safety/medical concerns        Walk 50 feet activity   Assist Walk 50 feet with 2 turns activity did not occur: Safety/medical concerns         Walk 150 feet activity   Assist Walk 150 feet activity did not occur: Safety/medical concerns         Walk 10 feet on uneven surface  activity   Assist Walk 10 feet on uneven surfaces activity did not occur: Safety/medical concerns         Wheelchair     Assist Is the patient using a wheelchair?: Yes Type of Wheelchair: Manual  Wheelchair assist level: Total Assistance - Patient < 25% Max wheelchair distance: 150    Wheelchair 50 feet with 2 turns activity    Assist        Assist Level: Total Assistance - Patient < 25%   Wheelchair 150 feet activity     Assist      Assist Level: Total Assistance - Patient < 25%   Blood pressure 118/73, pulse 74, temperature 98.5 F (36.9 C), resp. rate 16, height '5\' 6"'$  (1.676 m), weight 61.4 kg, SpO2 95 %.    General: Alert, No apparent distress. Resting in bed. HEENT: Head is normocephalic,  PERRLA, EOMI, oral mucosa pink and moist Neck: Supple Heart: Reg rate and rhythm. No murmurs or extra heart sounds. Chest: CTA bilaterally, normal effort Abdomen: Soft, non-tender, non-distended, bowel sounds positive. Extremities: No clubbing, cyanosis, or edema. Psych: Pt's affect is appropriate. Pleasant. Some impulsivity at times. Skin: no breakdown noted Neuro:   Alert and oriented x3.  Still has neglect of left side-improving. CN II-X12 grossly intact.  Sensation intact x4.  Musculoskeletal: 5/5 RUE and RLE 3-4-/5 in LUE, L HF 4-/5, L Knee extension 4/5, L Ankle FP/DF 3/5  Medical Problem List and Plan: 1. Functional deficits secondary to intraparenchymal hematoma in the right rontal lobe             -patient may  shower             -ELOS/Goals: 12-14 days min A CGA, expected dc 5/31             -Continue CIR for PT, OT and SLP  -sT working on problem solving skills  -CT head after fall 5/15 without acute changes, discussed fall prevention 5/21 Continue fall prevention measures.  2.  Antithrombotics: -DVT/anticoagulation:  Mechanical: Antiembolism stockings, thigh (TED hose) Bilateral lower extremities             No Lovenoxetc due to IPH             -antiplatelet therapy: N/A 3. Pain Management: 4. Mood: Effexor 75 mg daily             -antipsychotic agents: N/A  -5/11 Good mood today, continue effexor '75mg'$  5. Neuropsych: This patient is capable of making decisions on her own behalf. 6. Skin/Wound Care: Routine skin check 7. Fluids/Electrolytes/Nutrition: Routine in and out with follow-up chemistries  -5/11 midline removed yesterday 8.  Seizure disorder- new onset since IPH-.  Keppra 1000 mg every 12 hours 9.  Hypertension.  Cozaar 100 mg daily.  Monitor with increased mobility  Well controlled, continue cozaar            -5/20 Continue Cozaar 100 mg daily           -5/22 BP well controlled, continue Cozaar current dose    01/31/2022    5:35 AM 01/30/2022    7:39 PM 01/30/2022    2:06 PM  Vitals with BMI  Systolic [phone]  Diastolic 73 63 62  Pulse 74 92 98    10.  History of left breast cancer with radioactive seed localization 08/28/2020.  Follow-up outpatient 11. Hx of R frontal ICH 2017 12. Poor appetite- might need supplements or appetite stimulants  -5/10 albumin a little low, encourage PO intake             -5/23 Ate all  breakfast this AM, continue to monitor 13. Diarrhea   -5/17 resolved  -5/18 BM yesterday, continue monitor             -  5/20 BM on 5/17, CTM             -5/21 LBM 5/20 on days (per pt) 14. UTI    -UA on 5/18: + leuks, + nitrite, many bacteria, culture pending.     -Pt started on Bactrim DS on 01/28/22.      -5/22 no dysuria this AM, culture shows ecoli that was sensitive to bactrim, continue to monitor   LOS: 12 days A FACE TO [place] 01/31/2022, 7:33 AM

## 2022-01-31 NOTE — Progress Notes (Signed)
Occupational Therapy Session Note  Patient Details  Name: Cynthia [name] MRN: [id number] Date of Birth: 1945-11-04  Today's Date: 01/31/2022 OT Individual Time: 0917-1000 OT Individual Time Calculation (min): 43 min    Short Term Goals: Week 2:  OT Short Term Goal 1 (Week 2): Pt will be able to transfer to toilet with mod A. OT Short Term Goal 2 (Week 2): Pt will be able to stand at toilet with mod A during clothing management. OT Short Term Goal 3 (Week 2): Pt will demonstrate improve dual tasking during shower to maintain her balance while washing UB. OT Short Term Goal 4 (Week 2): Pt will demonstrate improved L side awareness to don shirt with only 3 cues or less.  Skilled Therapeutic Interventions/Progress Updates:  Pt greeted seated in w/c  agreeable to OT intervention. Session focus on various therapeutic activities focused on standing balance, proprioception, sit>stands, stand pivot tranfers and upright posture.   Pt continues to present with LLE extensor tone and impaired proprioception of LUE/LLE. Pt completed sit>stands from EOM with CGA with pt instructed to reach forward to retrieve cards from mirror with RUE. Practiced stand pivot transfers with pt completed x2 with MIN A +2 needing visual cue of dots on floor to problem solve where pt step bilateral feet, pt needed MAX multimodal cues for body mechanics and sequencing during transfer as pt presents with flexed posture during stand pivots with HHA.   Worked on pre gait task for functional mobility of pt stepping to R and L to colored dots and returning to midline, pt needed MOD A +2 to complete task as pt having difficulty stepping L foot fully on floor as pt prefers to step onto L toes needing MAX A to reposition foot and MAX cues to reposition posture as pt present with flexed posture in addition to having her L hip externally rotated in standing.         Utilized Geologist, engineering for all therapeutic activities to provide visual  feedback.   Pt transported back to room in w/c where  pt left up in w/c with alarm belt activated and all needs within reach.                       Therapy Documentation Precautions:  Precautions Precautions: Fall Precaution Comments: L LE extensor tone Restrictions Weight Bearing Restrictions: No Other Position/Activity Restrictions: tendency to stand on toes on L foot due to extension tone/L ankle contracture    Pain: no pain reported during session     Therapy/Group: Individual Therapy  Corinne Ports [facility] 01/31/2022, 12:07 PM

## 2022-01-31 NOTE — Progress Notes (Signed)
Physical Therapy Session Note  Patient Details  Name: Cynthia [name] MRN: [id number] Date of Birth: 09-18-1945  Today's Date: 01/31/2022 PT Individual Time: 0802-0848 PT Individual Time Calculation (min): 46 min   Short Term Goals: Week 1:  PT Short Term Goal 1 (Week 1): Patient will complete supine <> sit with MinA consistently PT Short Term Goal 1 - Progress (Week 1): Progressing toward goal PT Short Term Goal 2 (Week 1): Patient will complete sit <> stand with LRAD and ModA consistently PT Short Term Goal 2 - Progress (Week 1): Met PT Short Term Goal 3 (Week 1): Patient will ambulate at least 20f with LRAD and ModA + wc follow as needed PT Short Term Goal 3 - Progress (Week 1): Met PT Short Term Goal 4 (Week 1): Patient will initiate stair training PT Short Term Goal 4 - Progress (Week 1): Met  Skilled Therapeutic Interventions/Progress Updates:  Patient seated on perch of STEDY on entrance to room. Patient alert and agreeable to PT session. Just finishing toileting with NT.   Patient with no pain complaint throughout session.  Therapeutic Activity: Transfers: Patient performed sit<>stand  in STEDY with CGA. Used  STEDY to complete LB dressing. Decreased praxis for requiring LLE lift to pull pants up. Sit<>stand blocked practice  throughout session with posterior bias noted and Min/ ModA to overcome initially. Improved balance noted with RW and forward hand positioning.  Noted improved ankle positioning on LLE with heel on floor while seated EOM with hip and knees at 90/90.   Gait Training:  Patient ambulated 516 x1 using RW with MinA for intermittent L lean, L hip lateral rotation and L hip extension with L step progression. Demonstrated RUE push>LUE push.  Provided vc/ tc for sequencing, increasing push of RW with LUE to maintain straight path.  Neuromuscular Re-ed: NMR facilitated during session with focus on standing balance. Pt guided in sit<>stand training with NDT cueing  at trunk. Pt performing sit<>stands with no AD and UE support on therapist. Cues provided for forward positioning of head, shoulders and hips for increased forward lean to overcome posterior bias. Is able to follow instructions with slow fade posteriorly most likely d/t  PF tone in LLE. Improved balance noted with use of RW and forward hand placement. Maintains standing balance with CGA and cues for increased push with LUE for slow fade to L side.  NMR performed for improvements in motor control and coordination, balance, sequencing, judgement, and self confidence/ efficacy in performing all aspects of mobility at highest level of independence.   Patient seated upright  in w/c at end of session with brakes locked, belt alarm set, and all needs within reach. Pt relates being happy to have walked with walker this day.    Therapy Documentation Precautions:  Precautions Precautions: Fall Precaution Comments: L LE extensor tone Restrictions Weight Bearing Restrictions: No Other Position/Activity Restrictions: tendency to stand on toes on L foot due to extension tone/L ankle contracture General:   Vital Signs:   Pain: Pain Assessment Pain Scale: 0-10 Pain Score: 0-No pain  Therapy/Group: Individual Therapy  JAlger SimonsPT, DPT 01/31/2022, 10:17 AM

## 2022-01-31 NOTE — Progress Notes (Signed)
Physical Therapy Session Note  Patient Details  Name: Cynthia [name] MRN: [id number] Date of Birth: 26-Dec-1945  Today's Date: 01/31/2022 PT Individual Time: 1415-1500 PT Individual Time Calculation (min): 45 min   Short Term Goals: Week 2:  PT Short Term Goal 1 (Week 2): Patient will complete supine <> sit with MinA consistently and LRAD PT Short Term Goal 2 (Week 2): Patient will compelte bed <> wc transfer with ModA consistently PT Short Term Goal 3 (Week 2): Patient will propel wc >68f with no more than ModA  Skilled Therapeutic Interventions/Progress Updates:     Patient in w/c in the room upon PT arrival. Patient alert and agreeable to PT session. Patient denied pain during session.  Focused session on L lower extremity extensor tone management and gait training. Attempted donning PLS AFO with PF bias, unable to don safely with current level of PF tone. Educated patient on DF Adjustable Night Splint and benefits for overnight stretch to PFs, patient agreeable to trying the splint and adjusting wear schedule as tolerated, MD made aware.   Therapeutic Activity: Transfers: Patient performed sit to/from stand x5 with min-mod A of 1-2 without AD or HHA. Provided verbal cues for forward weight shift, facilitated L knee extension with L foot blocked from sliding forward or adducting due to increased PF and adductor tone.  Gait Training:  Patient ambulated 100 and 200 feet performing L turns x4 (laps around the main gym) using B HHA with mod progressing to min A +2. Ambulated with forefoot at initial contact with increased L PF throughout, decreased L weight shift, forward trunk lean, and downward head gaze. Provided verbal cues for sequencing, increased L hamstring activation in pre-swing, heel strike at initial contact, and facilitation for L weight shift for increased heel contact and blocking L knee in stance to prevent hyperextension.  Neuromuscular Re-ed: Patient performed the  following L PF tone management activities: -seated prolonged DF stretch 2x2 min with manual facilitation through the knee and tactile pressure for inhibition of PFs -standing 2x2 min focused on increased L weight bearing for DF stretch without L knee hyperextension with manual facilitation, progressed from min A in standing to close supervision-CGA without upper extremity support  Patient in w/c in the room at end of session with breaks locked, seat belt alarm set, Telesitter in place, and all needs within reach.   Therapy Documentation Precautions:  Precautions Precautions: Fall Precaution Comments: L LE extensor tone Restrictions Weight Bearing Restrictions: No Other Position/Activity Restrictions: tendency to stand on toes on L foot due to extension tone/L ankle contracture    Therapy/Group: Individual Therapy  Ledonna Dormer L Ragnar Waas PT, DPT  01/31/2022, 4:02 PM

## 2022-02-01 DIAGNOSIS — R252 Cramp and spasm: Secondary | ICD-10-CM

## 2022-02-01 LAB — BASIC METABOLIC PANEL
Anion gap: 8 (ref 5–15)
BUN: 19 mg/dL (ref 8–23)
CO2: 25 mmol/L (ref 22–32)
Calcium: 9 mg/dL (ref 8.9–10.3)
Chloride: 104 mmol/L (ref 98–111)
Creatinine, Ser: 0.88 mg/dL (ref 0.44–1.00)
GFR, Estimated: >60 mL/min (ref >60)
Glucose, Bld: 95 mg/dL (ref 70–99)
Potassium: 4.2 mmol/L (ref 3.5–5.1)
Sodium: 137 mmol/L (ref 135–145)

## 2022-02-01 LAB — CBC
HCT: 36.3 % (ref 36.0–46.0)
Hemoglobin: 12.5 g/dL (ref 12.0–15.0)
MCH: 30.7 pg (ref 26.0–34.0)
MCHC: 34.4 g/dL (ref 30.0–36.0)
MCV: 89.2 fL (ref 80.0–100.0)
Platelets: 315 10*3/uL (ref 150–400)
RBC: 4.07 MIL/uL (ref 3.87–5.11)
RDW: 12.4 % (ref 11.5–15.5)
WBC: 6 10*3/uL (ref 4.0–10.5)
nRBC: 0 % (ref 0.0–0.2)

## 2022-02-01 MED ORDER — TIZANIDINE HCL 2 MG PO TABS
2.0000 mg | ORAL_TABLET | Freq: Every day | ORAL | Status: DC
  Administered 2022-02-01 – 2022-02-02 (×2): 2 mg via ORAL
  Filled 2022-02-01 (×2): qty 1

## 2022-02-01 NOTE — Progress Notes (Signed)
PROGRESS NOTE   Subjective/Complaints:  Pt did not tolerate ankle brace at night for LLE increased tone. No additional concerns.   Review of Systems  Respiratory:  Negative for cough and shortness of breath.   Cardiovascular:  Negative for chest pain.  Gastrointestinal:  Negative for constipation, diarrhea, nausea and vomiting.  Neurological:  Positive for focal weakness.    Objective:   No results found. Recent Labs    02/01/22 0639  WBC 6.0  HGB 12.5  HCT 36.3  PLT 315    Recent Labs    02/01/22 0639  NA 137  K 4.2  CL 104  CO2 25  GLUCOSE 95  BUN 19  CREATININE 0.88  CALCIUM 9.0     Intake/Output Summary (Last 24 hours) at 02/01/2022 0749 Last data filed at 02/01/2022 0500 Gross per 24 hour  Intake 1057 ml  Output 200 ml  Net 857 ml         Physical Exam: Vital Signs Blood pressure 134/65, pulse 77, temperature 98.3 F (36.8 C), temperature source Oral, resp. rate 17, height '5\' 6"'$  (1.676 m), weight 61.4 kg, SpO2 97 %.    Assessment/Plan: 1. Functional deficits which require 3+ hours per day of interdisciplinary therapy in a comprehensive inpatient rehab setting. Physiatrist is providing close team supervision and 24 hour management of active medical problems listed below. Physiatrist and rehab team continue to assess barriers to discharge/monitor patient progress toward functional and medical goals  Care Tool:  Bathing    Body parts bathed by patient: Right arm, Left arm, Chest, Abdomen, Front perineal area, Right upper leg, Left upper leg, Face   Body parts bathed by helper: Right lower leg, Left lower leg, Buttocks     Bathing assist Assist Level: Moderate Assistance - Patient 50 - 74%     Upper Body Dressing/Undressing Upper body dressing   What is the patient wearing?: Pull over shirt    Upper body assist Assist Level: Moderate Assistance - Patient 50 - 74%    Lower Body  Dressing/Undressing Lower body dressing      What is the patient wearing?: Pants, Incontinence brief     Lower body assist Assist for lower body dressing: Maximal Assistance - Patient 25 - 49%     Toileting Toileting    Toileting assist Assist for toileting: Total Assistance - Patient < 25%     Transfers Chair/bed transfer  Transfers assist     Chair/bed transfer assist level: Moderate Assistance - Patient 50 - 74%     Locomotion Ambulation   Ambulation assist      Assist level: 2 helpers Assistive device: Hand held assist Max distance: 200 ft   Walk 10 feet activity   Assist  Walk 10 feet activity did not occur: Safety/medical concerns  Assist level: 2 helpers Assistive device: Hand held assist   Walk 50 feet activity   Assist Walk 50 feet with 2 turns activity did not occur: Safety/medical concerns  Assist level: 2 helpers Assistive device: Hand held assist    Walk 150 feet activity   Assist Walk 150 feet activity did not occur: Safety/medical concerns  Assist level: 2 helpers Assistive device:  Hand held assist    Walk 10 feet on uneven surface  activity   Assist Walk 10 feet on uneven surfaces activity did not occur: Safety/medical concerns         Wheelchair     Assist Is the patient using a wheelchair?: Yes Type of Wheelchair: Manual    Wheelchair assist level: Total Assistance - Patient < 25% Max wheelchair distance: 150    Wheelchair 50 feet with 2 turns activity    Assist        Assist Level: Total Assistance - Patient < 25%   Wheelchair 150 feet activity     Assist      Assist Level: Total Assistance - Patient < 25%   Blood pressure 134/65, pulse 77, temperature 98.3 F (36.8 C), temperature source Oral, resp. rate 17, height '5\' 6"'$  (1.676 m), weight 61.4 kg, SpO2 97 %.    General: Alert, No apparent distress. Working with PT HEENT: Head is normocephalic,  PERRLA, EOMI, oral mucosa pink and  moist Neck: Supple Heart: Reg rate and rhythm. No murmurs or extra heart sounds. Chest: CTA bilaterally, normal effort Abdomen: Soft, non-tender, non-distended, bowel sounds positive. Extremities: No clubbing, cyanosis, or edema. Psych: Pt's affect is appropriate. Pleasant. Some impulsivity at times. Skin: no breakdown noted Neuro:  Alert and oriented x3.  Still has neglect of left side-improving. CN II-X12 grossly intact.  Sensation intact x4.  Musculoskeletal:  Increased one in extensors and adductors of LLE, increased tone in ankle plantarflexors on Left  Medical Problem List and Plan: 1. Functional deficits secondary to intraparenchymal hematoma in the right rontal lobe             -patient may  shower             -ELOS/Goals: 12-14 days min A CGA, expected dc 5/31             -Continue CIR for PT, OT and SLP  -sT working on problem solving skills  -CT head after fall 5/15 without acute changes, discussed fall prevention  -increased tone in RLE especially plantarflexion of ankle interfering with gait 2.  Antithrombotics: -DVT/anticoagulation:  Mechanical: Antiembolism stockings, thigh (TED hose) Bilateral lower extremities             No Lovenoxetc due to IPH             -antiplatelet therapy: N/A 3. Pain Management: 4. Mood: Effexor 75 mg daily             -antipsychotic agents: N/A  -5/11 Good mood today, continue effexor '75mg'$  5. Neuropsych: This patient is capable of making decisions on her own behalf. 6. Skin/Wound Care: Routine skin check 7. Fluids/Electrolytes/Nutrition: Routine in and out with follow-up chemistries  -5/11 midline removed yesterday 8.  Seizure disorder- new onset since IPH-.  Keppra 1000 mg every 12 hours 9.  Hypertension.  Cozaar 100 mg daily.  Monitor with increased mobility  Well controlled, continue cozaar            -5/20 Continue Cozaar 100 mg daily           -5/22 BP well controlled, continue Cozaar current dose    02/01/2022    5:00 AM  01/31/2022    7:30 PM 01/31/2022    2:16 PM  Vitals with BMI  Systolic 425 956 387  Diastolic 65 69 57  Pulse 77 95 91    10.  History of left breast cancer with radioactive seed localization 08/28/2020.  Follow-up outpatient 11. Hx of R frontal ICH 2017 12. Poor appetite- might need supplements or appetite stimulants  -5/10 albumin a little low, encourage PO intake             -5/22 Ate all breakfast this AM, continue to monitor 13. Diarrhea , improved  5/23 Normal BM this AM, follow 14. UTI     -UA on 5/18: + leuks, + nitrite, many bacteria, culture pending.      -Pt started on Bactrim DS on 01/28/22.       -5/22 no dysuria this AM, culture shows ecoli that was sensitive to bactrim, continue to monitor  -5/23 WBC wnl 15. Spasticity   -increased tone in LLE particularly with ankle plantarflexors  -will order dorsiflexion night splint  -5/23 Tizanidine QHS started    LOS: 13 days A FACE TO [facility] 02/01/2022, 7:49 AM

## 2022-02-01 NOTE — Progress Notes (Signed)
Physical Therapy Session Note  Patient Details  Name: Cynthia [name] MRN: [id number] Date of Birth: Jun 15, 1946  Today's Date: 02/01/2022 PT Individual Time: 0830-0958 PT Individual Time Calculation (min): 88 min   Short Term Goals: Week 2:  PT Short Term Goal 1 (Week 2): Patient will complete supine <> sit with MinA consistently and LRAD PT Short Term Goal 2 (Week 2): Patient will compelte bed <> wc transfer with ModA consistently PT Short Term Goal 3 (Week 2): Patient will propel wc >53f with no more than ModA  Skilled Therapeutic Interventions/Progress Updates:     Patient in bed upon PT arrival. Patient alert and agreeable to PT session. Patient denied pain during session.  Focused session on L lower extremity tone management and motor planning and control with functional mobility with transfers and gait using RW.  Therapeutic Activity: Bed Mobility: Patient performed supine to/from sit with supervision in a flat bed without use of bed rails. Patient performed rolling supine<>prone with min-mod A with assist from chuck pad for lateral scooting. Provided verbal cues for sequencing and attention and placement of L hemi-body. Transfers: Patient performed sit to/from stand from the bed x2 using, toilet x1, and mat table x1 with min-mod A using RW. Provided verbal cues for hand placement, forward weight shift, and L foot placement to promote ankle DF and knee extension. Patient was continent of bowl and bladder during toileting, performed perri-care in standing with set-up assist and lower body clothing management with mod-max A with min-mod A for standing balance.   Gait Training:  Patient ambulated 17 feet to/from the bathroom, trespassing the bathroom incline, and 187 feet using RW with mod progressing to min A. Ambulated with forefoot at initial contact with increased L PF throughout, decreased L weight shift, forward trunk lean, and downward head gaze. Provided verbal cues for  sequencing, increased L hamstring activation in pre-swing, heel strike at initial contact, erect posture, looking ahead, and facilitation and cues for management of AD.  Neuromuscular Re-ed: Patient performed the following L lower extremity tone and motor control activities: -prone DF prolonged stretch with progressive pressure with knee flexion to break up extensor synergistic pattern -Applied Kinesiotape over gastroc/soleus using 0% stretch distal to proximal placement with the aim of inhibiting target muscles and over anterior tibialis using 25-50% stretch proximal to distal placement with aim of facilitating target muscle for reduced PF tone and increased active DF activation. Prior to application, patient denied any history of skin irritation or allergy to adhesive. Educated patient on purpose of kinesiotape placement and signs symptoms of allergic reaction or irritation. Cleaned patient's skin and applied test strip at beginning of session and removed at end of session without sings of skin irritation. Instructed patient that the tape can be left on up to 3 days and can be worn in the shower. Instructed to removed the tape if peeling off, signs of skin irritation arise, or it has been on for >3 days. Informed patient that the tape is best removed in the shower or with a wet wash cloth. Patient stated understanding. Rehab team informed about tape placement to assist with monitoring patient's response. -sit to stand from mat table with out AD x5 with min progressing to CGA focused on forward weight shift, and equal weight bearing for tone management and balance -standing balance x5 20-60 sec trials with and without upper extremity support focused on patients awareness of body symmetry and balance strategies with min A-CGA-close supervision  Patient in w/c in the  room at end of session with breaks locked, seat belt alarm set, Telesitter in place, and all needs within reach.   Therapy  Documentation Precautions:  Precautions Precautions: Fall Precaution Comments: L LE extensor tone Restrictions Weight Bearing Restrictions: No Other Position/Activity Restrictions: tendency to stand on toes on L foot due to extension tone/L ankle contracture    Therapy/Group: Individual Therapy  Travarus Trudo L Rilen Shukla PT, DPT  02/01/2022, 3:48 PM

## 2022-02-01 NOTE — Progress Notes (Signed)
Speech Language Pathology Weekly Progress and Session Note  Patient Details  Name: Cynthia [name] MRN: [id number] Date of Birth: October 18, 1945  Beginning of progress report period: Jan 25, 2022 End of progress report period: Feb 01, 2022  Today's Date: 02/01/2022 SLP Individual Time: 0732-0832 SLP Individual Time Calculation (min): 60 min  Short Term Goals: Week 2: SLP Short Term Goal 1 (Week 2): Pt will demonstrate sustained attention to functional tasks for 15-20 minutes with min A verbal cues. - Demonstrated divided attention within the context of functional task (eating and conversing) with adherence to general aspiration precautions and maintaining conversational topic for 15+ minutes with Sup A and intermittent breaks. SLP Short Term Goal 1 - Progress (Week 2): Met  SLP Short Term Goal 2 (Week 2): Pt will demonstrate basic problem solving skills with min A verbal cues. - Basic problem-solving with AM meal given Sup to Min A for organization and motor planning. SLP Short Term Goal 2 - Progress (Week 2): Met  SLP Short Term Goal 3 (Week 2): Pt will self-monitor and self-correct errors in functional tasks with mod A verbal cues. - Did not formally address this date.  SLP Short Term Goal 3 - Progress (Week 2): Met  SLP Short Term Goal 4 (Week 2): Pt will demonstrate recall of daily, novel information with min A verbal cues for external aids. - Demonstrated recall of 3 out of 3 events from ST session on 5/22 with Sup A. Recall 3 out of 6 medications with Mod I; 6 out of 6 with Min verbal A.  SLP Short Term Goal 4 - Progress (Week 2): Met    New Short Term Goals: Week 3: SLP Short Term Goal 1 (Week 3): STG's = LTG's due to ELOS  Weekly Progress Updates: Pt has demonstrated steady and marked improvement this past week, as evident by meeting 4 out of 4 short-term goals set. Pt education is ongoing re: use of internal and external memory aids, executive functioning strategies for  self-monitoring and correcting errors, as well as Mod A for verbal sequencing within the context of iADL tasks. Despite improvement in cognitive performance this past week, pt continues to present with mild-moderate cognitive impairment and will continue to benefit from skilled ST intervention in light of ongoing stimulability for skilled interventions.   Continue to recommend assistance and supervision with completion of iADL tasks, in addition to OP ST intervention. Family education has yet to be completed.   Intensity: Minumum of 1-2 x/day, 30 to 90 minutes Frequency: 3 to 5 out of 7 days Duration/Length of Stay: 5/31 Treatment/Interventions: Cognitive remediation/compensation;Cueing hierarchy;Functional tasks;Medication managment;Patient/family education;Internal/external aids   Daily Session Skilled Therapeutic Interventions:  Pt seen this date for skilled ST intervention targeting cognitive-linguistic goals outlined above. Pt received awake/alert and sitting upright in bed. Waiting for AM meal to arrive. Agreeable to ST intervention.  Please note, pt consumed 100% of breakfast with Set-Up A.  SLP facilitated today's session by providing the following skilled ST interventions within the context of functional tx tasks: reinforcement of compensatory organization and executive functioning strategies, which pt recalled with Sup A, Min A for recall of current medications, and Min-Mod A for thought organization and implementation of compensatory memory strategies, particularly association and repetition, extended processing time, verbal reinforcement for use of L hand during self-feeding, and intermittent mental rest breaks. Please see above for objective data re: pt's performance during today's session.  Overall, pt remains stimulable for skilled ST intervention and continues to demonstrate improvement  in cognitive skills/abilities as evident by meeting 4 out of 4 short-term goals for this past week;  please see above for additional information.   Pt left in bed with all safety measures activated. Call bell reviewed and within reach, and all immediate needs met. Continue per current ST POC.      Pain Pain Assessment Pain Score: 0-No pain  Therapy/Group: Individual Therapy   A  02/01/2022, 1:06 PM

## 2022-02-01 NOTE — Progress Notes (Signed)
Orthopedic Tech Progress Note Patient Details:  Cynthia [name] July 18, 1946 [id number]  Called in order to [facility] for a  DORSIFLEXION NIGHT SPLINT   Patient ID: [name], female   DOB: [birth year], 76 y.o.   MRN: [id number]  [name] 02/01/2022, 11:42 AM

## 2022-02-01 NOTE — Progress Notes (Signed)
Occupational Therapy Session Note  Patient Details  Name: Cynthia [name] MRN: [id number] Date of Birth: 1946-08-12  Today's Date: 02/01/2022 OT Individual Time: 1100-1200 OT Individual Time Calculation (min): 60 min    Short Term Goals: Week 2:  OT Short Term Goal 1 (Week 2): Pt will be able to transfer to toilet with mod A. OT Short Term Goal 2 (Week 2): Pt will be able to stand at toilet with mod A during clothing management. OT Short Term Goal 3 (Week 2): Pt will demonstrate improve dual tasking during shower to maintain her balance while washing UB. OT Short Term Goal 4 (Week 2): Pt will demonstrate improved L side awareness to don shirt with only 3 cues or less.  Skilled Therapeutic Interventions/Progress Updates:    Pt received in wc ready for therapy and agreeable to a shower.  Pt stood to RW with cues to push up from wc with min A, she then ambulated into bathroom with RW with min A and frequent cues for head alignment as her head tilts L and then her body leans left. She had increased difficulty turning around to sit on tub bench in shower with mod cues and mod A. +2 present for safety.   On tub bench pt doffed clothing with mod A. Washed self with close S UB, cGA in standing for LB with frequent cues to be aware of sitting balance as she would often lean to left.   Due to time constraints, had pt transfer to wc with stand pivot with max VC and min A.   Pt dressed from wc. See ADL documentation below.  Pt is making progress with her mobility but she continues to be challenged with her ability to dual task (ie holding balance while showering) and problem solving processing with her L perceptual deficits.   Pt resting in w/c with belt alarm on and all needs met.    Therapy Documentation Precautions:  Precautions Precautions: Fall Precaution Comments: L LE extensor tone Restrictions Weight Bearing Restrictions: No Other Position/Activity Restrictions: tendency to stand on toes  on L foot due to extension tone/L ankle contracture   Pain: Pain Assessment Pain Score: 0-No pain ADL: ADL Eating: Set up Where Assessed-Eating: Wheelchair Grooming: Supervision/safety Where Assessed-Grooming: Wheelchair Upper Body Bathing: Supervision/safety Where Assessed-Upper Body Bathing: Shower Lower Body Bathing: Contact guard Where Assessed-Lower Body Bathing: Shower Upper Body Dressing: Minimal assistance Where Assessed-Upper Body Dressing: Wheelchair Lower Body Dressing: Moderate assistance Where Assessed-Lower Body Dressing: Wheelchair Toileting: Moderate assistance Where Assessed-Toileting: Glass blower/designer: Psychiatric nurse Method: Ambulating, Stand pivot Science writer: Raised toilet seat Tub/Shower Transfer: Unable to assess Tub/Shower Transfer Method: Unable to assess Social research officer, government: Minimal assistance Social research officer, government Method: Ambulating, Stand pivot [employer] Equipment: Transfer tub bench   Therapy/Group: Individual Therapy  [place] 02/01/2022, 12:40 PM

## 2022-02-01 NOTE — Progress Notes (Signed)
Physical Therapy Session Note  Patient Details  Name: Cynthia [name] MRN: [id number] Date of Birth: March 27, 1946  Today's Date: 02/01/2022 PT Individual Time: 1015-1045 PT Individual Time Calculation (min): 30 min   Short Term Goals: Week 2:  PT Short Term Goal 1 (Week 2): Patient will complete supine <> sit with MinA consistently and LRAD PT Short Term Goal 2 (Week 2): Patient will compelte bed <> wc transfer with ModA consistently PT Short Term Goal 3 (Week 2): Patient will propel wc >32f with no more than ModA  Skilled Therapeutic Interventions/Progress Updates:      Pt sitting in w/c to start - agreeable to PT tx and denies pain. Uses her memory notebook to recall events of prior PT session. Pt requesting to attempt stair training today.   We discussed her home entrance. She has a gravel driveway and typically uses back entrance 4 STE with 2 rails. ~50-623fof grassy area to access these stairs. Front steps 4-5 STE with no rails. ~5052ff grassy area to access the front stairs also. Spent time reinforcing need for bilateral hand rails for back entrance if that's the way she prefer's. She reports family members can install these.  Transported in w/c to main rehab gym. Practiced stair training using 6inch steps and 2 hand rails.  Demonstrated technique and sequencing to improve carryover and awareness. Sshe navigated up/down 4 + 4 seated rest) steps. he required minA for navigating up the 4inch steps and modA for navigating down them. Motor planning deficits impacting sequencing and safety, as well as L inattention with guiding her L hand along hand rail. During the 2nd trial, she did much better with the side stepping method with 2 hands to 1 rail.   Returned to her room and she concluded session seated in w/c with safety belt alarm on, telesitter in room. Call bell in lap. She wrote down activities completed in this session in memory notebook. Pt pleased with her ongoing progress in  therapies.    Therapy Documentation Precautions:  Precautions Precautions: Fall Precaution Comments: L LE extensor tone Restrictions Weight Bearing Restrictions: No Other Position/Activity Restrictions: tendency to stand on toes on L foot due to extension tone/L ankle contracture General:    Therapy/Group: Individual Therapy  ChrAlger Simons23/2023, 7:33 AM

## 2022-02-02 NOTE — Progress Notes (Signed)
Patient ID: Cynthia [name], female   DOB: 15-Jul-1946, 76 y.o.   MRN: [id number]  Met with pt to give her the team conference update, she feels she is doing well and has started walking in therapies. Discussed the therapy team recommend 24/7 care at home and her son can not provide this level of care, according to her son Cynthia [name]. Discussed with her the memory issues and attention issues she has from this CVA, which pt does acknowledge this stroke was worse than her first one in 2017. Discussed going to another rehab before going home due to not having 24/7 care in place. Pt feels she can manage at home by herself and her boys worry too much. Had spoken with Cynthia [name] yesterday who reports his brother-Cynthia [name] who lives with pt is not able to provide care and works third shift and sleeps most of the day. Cynthia [name] wants to pursue short term NHP for more rehab if her insurance will allow it. Discussed with pt what son feels and she will think about it but feels she will be fine at home and can take care of her needs. Son will need to talk with pt regarding plan. Pt is unaware of her deficits. Will continue to work on safest plan for pt.

## 2022-02-02 NOTE — Patient Care Conference (Signed)
Inpatient RehabilitationTeam Conference and Plan of Care Update Date: 02/02/2022   Time: 12:03 PM    Patient Name: Cynthia [name]      Medical Record Number: [id number]  Date of Birth: 1946/04/08 Sex: Female         Room/Bed: 4M06C/4M06C-01 Payor Info: Payor: HU[employer] MEDICARE / Plan: [employer] MEDICARE CHOICE PPO / Product Type: *No Product type* /    Admit Date/Time:  01/19/2022  3:03 AM  Primary Diagnosis:  Intracerebral hemorrhage [facility])  Hospital Problems: Principal Problem:   Intracerebral hemorrhage Active Problems:   ICH (intracerebral hemorrhage) ([place])   Left-sided visual neglect   Partial symptomatic epilepsy with complex partial seizures, not intractable, without status epilepticus ([place])   Mood disorder in conditions classified elsewhere   Poor appetite    Expected Discharge Date: Expected Discharge Date: 02/09/22  Team Members Present: Physician leading conference: Dr. Jennye Boroughs Social Worker Present: Ovidio Kin, LCSW Nurse Present: Dorien Chihuahua, RN PT Present: Becky Sax, PT OT Present: Meriel Pica, OT SLP Present: Weston Anna, SLP PPS Coordinator present : Gunnar Fusi, SLP     Current Status/Progress Goal Weekly Team Focus  Bowel/Bladder   bowel/bladder continent.  maintain continence.  routine toileting, documentation.   Swallow/Nutrition/ Hydration   Regular diet  N/A  Maintain regular diet   ADL's   continues to need min - mod with self care, transfers improved to min, continues to have L neglect, dual task impairments  CGA overall (may need to be downgraded to min A)  Pt educ, ADL training, NMR, postural control/head control, L side awareness   Mobility   Supervision bed mobility, min with intermittent mod A transfers with RW, gait 200 ft consistently with RW and Min A, min-mod A 4 steps 1 rail. Continues to have significant extensor tone in L LE with most in PF, started DF adjustable night splint today with heel wedge due to PF and  inversion.  CGA/min A overall, will update gait gaols, could progress to CGA overall with x1 additional week due to significant progress this week.  L hemi NMR/awareness, tone management, functional mobility, gait and stair training, balance, recall and carry-over of cues with mobility, patient/caregiver education   Communication   N/A  N/A  N/A   Safety/Cognition/ Behavioral Observations  Min to Mod A for organization, sequencing, problem-solving, and recall  Sup A to Min A  functional problem-solving, utilizing memory notebook, increasing insight, and self-monitoring/correction   Pain   no c/o pain  remain pan free  routine assessment, documention   Skin   intact  remain intact  routne assessment, documention; prn intervention     Discharge Planning:  Son's trying to come up with a plan do not have 24/7 care for Mom and may want to pursue short term NHP   Team Discussion: Patient making slow progress. Incontinent but getting more continent with toileting. Continue to note left head tilt and needs cues to correct.  Patient on target to meet rehab goals: yes, currently needs mod assist for cognition; basic problem  solving, organization and recall. Decreased pushing but cannot dual task. Falls apart when processing dressing; just putting on a shirt. Note plantar flexion extensor tone; night splint added.  *See Care Plan and progress notes for long and short-term goals.   Revisions to Treatment Plan:  N/A   Teaching Needs: Safety, transfers, toileting, medications, etc  Current Barriers to Discharge: Home enviroment access/layout, Incontinence, and Lack of/limited family support  Possible Resolutions to Barriers: Recommending 24/7  care Family education OP follow up DME: w/c  vs.transport chair     Medical Summary Current Status: Right intraparenchymal hematoma, htn, UTI, diarrhea,seizure disorder, neglect, spasticity  Barriers to Discharge: Behavior;Decreased family/caregiver  support;Medical stability  Barriers to Discharge Comments: Right intraparenchymal hematoma, htn, UTI, diarrhea,seizure disorder, neglect, spasticity Possible Resolutions to [employer] Focus: continue follow labs, medications for increased tone, monitor PO intake, safety   Continued Need for Acute Rehabilitation Level of Care: The patient requires daily medical management by a physician with specialized training in physical medicine and rehabilitation for the following reasons: Direction of a multidisciplinary physical rehabilitation program to maximize functional independence : Yes Medical management of patient stability for increased activity during participation in an intensive rehabilitation regime.: Yes Analysis of laboratory values and/or radiology reports with any subsequent need for medication adjustment and/or medical intervention. : Yes   I attest that I was present, lead the team conference, and concur with the assessment and plan of the team.   Dorien Chihuahua B 02/02/2022, 2:30 PM

## 2022-02-02 NOTE — Progress Notes (Signed)
Physical Therapy Weekly Progress Note  Patient Details  Name: Cynthia [name] MRN: [id number] Date of Birth: [birth year]  Beginning of progress report period: Feb 09, 2022 End of progress report period: Feb 02, 2022  Today's Date: 02/02/2022 PT Individual Time: 0950-1050 PT Individual Time Calculation (min): 60 min   Patient has met 3 of 3 short term goals.  Patient with excellent progress this week with improve cognition and focus on L lower extremity tone management and motor planning. Patient currently performs bed mobility with supervision, transfers with min A, intermittent mod A, with and without RW, is ambulating >150 feet with mod-min A using RW, and performing 4 steps with min-mod A with 1-2 rails. Will have scheduled family education closer to d/c date.   Patient continues to demonstrate the following deficits muscle weakness and muscle joint tightness, decreased cardiorespiratoy endurance, abnormal tone, decreased coordination, and decreased motor planning, decreased attention to left, decreased attention, decreased problem solving, decreased safety awareness, and decreased memory, and decreased standing balance, decreased postural control, hemiplegia, decreased balance strategies, and difficulty maintaining precautions and therefore will continue to benefit from skilled PT intervention to increase functional independence with mobility.  Patient progressing toward long term goals.  Continue plan of care.  PT Short Term Goals Week 2:  PT Short Term Goal 1 (Week 2): Patient will complete supine <> sit with MinA consistently and LRAD PT Short Term Goal 1 - Progress (Week 2): Met PT Short Term Goal 2 (Week 2): Patient will compelte bed <> wc transfer with ModA consistently PT Short Term Goal 2 - Progress (Week 2): Met PT Short Term Goal 3 (Week 2): Patient will propel wc >59f with no more than ModA PT Short Term Goal 3 - Progress (Week 2): Met Week 3:  PT Short Term Goal 1 (Week 3):  STG=LTG due to ELOS.  Skilled Therapeutic Interventions/Progress Updates:     Patient in w/c in the room upon PT arrival. Patient alert and agreeable to PT session. Patient denied pain during session.  Therapeutic Activity: Transfers: Patient performed sit to/from stand x4 with CGA-min A using RW. Provided verbal cues for forward weight shift and hand placement L>R due to inattention.  Gait Training:  Patient ambulated >200 feet x2 using RW with min A.  Ambulated with forefoot at initial contact with increased L PF throughout, decreased L weight shift, forward trunk lean, and downward head gaze. Provided verbal cues for sequencing, increased L hamstring activation in pre-swing, heel strike at initial contact, erect posture, looking ahead, and facilitation and cues for management of AD. Patient ascended/descended 4x6" steps using R rail with min A. Performed step-to gait pattern leading with R while ascending and L while descending. Provided cues for technique, L foot placement, and sequencing.   Spent increased time donning L DF adjustable night splint. Required wedge under her heel to accommodate for PF tone and reduce inversion at the ankle, will remove as ROM improves. Patient denied pain and LPN made aware to check in on patient to ensure continued comfort and foot positioning.   Patient in w/c with DF adjustable night splint donned at end of session with breaks locked, seat belt alarm set, and all needs within reach.   Therapy Documentation Precautions:  Precautions Precautions: Fall Precaution Comments: L LE extensor tone Restrictions Weight Bearing Restrictions: No Other Position/Activity Restrictions: tendency to stand on toes on L foot due to extension tone/L ankle contracture   Therapy/Group: Individual Therapy  Cynthia [name] L Mamoudou [place]ve[place] PT, DPT  02/02/2022, 12:40 PM

## 2022-02-02 NOTE — Evaluation (Signed)
Recreational Therapy Assessment and Plan  Patient Details  Name: Cynthia [name] MRN: [id number] Date of Birth: 1945-11-20 Today's Date: 02/02/2022  Rehab Potential:  Good ELOS:   5/31  Assessment  Hospital Problem: Principal Problem:   Intracerebral hemorrhage Active Problems:   ICH (intracerebral hemorrhage) ([facility])   Left-sided visual neglect   Partial symptomatic epilepsy with complex partial seizures, not intractable, without status epilepticus ([place])   Mood disorder in conditions classified elsewhere     Past Medical History:      Past Medical History:  Diagnosis Date   Breast cancer ([place])     Colon polyps     Depression      history of   Diverticulitis     GERD (gastroesophageal reflux disease)     Hypertension     Osteoporosis     Seizures ([place])     Stroke [facility])      Past Surgical History:       Past Surgical History:  Procedure Laterality Date   BREAST LUMPECTOMY WITH RADIOACTIVE SEED LOCALIZATION Left 08/28/2020    Procedure: LEFT BREAST LUMPECTOMY WITH RADIOACTIVE SEED LOCALIZATION;  Surgeon: Stark Klein, MD;  Location: [place];  Service: General;  Laterality: Left;  RNFA   IR GENERIC HISTORICAL   08/05/2016    IR ANGIO VERTEBRAL SEL VERTEBRAL UNI R MOD SED 08/05/2016 Luanne Bras, MD [facility]   IR GENERIC HISTORICAL   08/05/2016    IR ANGIO INTRA EXTRACRAN SEL COM CAROTID INNOMINATE BILAT MOD SED 08/05/2016 Luanne Bras, MD [facility]   IR GENERIC HISTORICAL   08/05/2016    IR ANGIO VERTEBRAL SEL SUBCLAVIAN INNOMINATE UNI L MOD SED 08/05/2016 Luanne Bras, MD [facility]   MOUTH SURGERY       TONSILLECTOMY       TUBAL LIGATION          Assessment & Plan Clinical Impression: Patient is a 76 y.o. year old female with history of hypertension, right frontal ICH 2017, breast cancer with lumpectomy with radioactive seed localization 08/28/2020.  Per chart review patient lives with her children.  Bed bath main level of home with 4 steps  to entry.  Son works third shift at home during the day.  She is a retired Pharmacist, hospital reportedly independent prior to admission.  Presented 01/10/2022 with acute onset of left-sided weakness and slid out of bed.  She attempted to stand hitting the back of her head without loss of consciousness.  Cranial CT scan showed intraparenchymal hematoma in the right parasagittal frontal lobe measuring 3.0 cm AP by 2.3 cm TV by 3.0 cm with mild surrounding edema but no midline shift.  CT cervical spine maxillofacial negative.  Initial EEG negative.  Overnight EEG with video showed seizure arising from right frontal temporal region.  Admission chemistry unremarkable except glucose 108, WBC 12,700.  Echocardiogram ejection fraction of 60 to 65% no wall motion abnormalities.  Patient has been loaded with Keppra for seizure prophylaxis.  Follow-up MRI of the brain as well as angio head and neck showed acute/subacute hematoma right pericallosal frontal lobe unchanged.  MRA negative.  Tolerating a regular diet.  Therapy evaluations completed due to patient decreased functional mobility was admitted for a comprehensive rehab program.   Pt presents with decreased activity tolerance, decreased functional mobility, decreased balance, left inattention, decreased initiation, decreased attention, decreased awareness, decreased problem solving, decreased safety awareness, decreased memory, and delayed processing, feelings of stress Limiting pt's independence with leisure/community pursuits.  Met with pt today to  discuss TR services including leisure education, activity analysis/modifications and stress management.  Also discussed the importance of social, emotional, spiritual health in addition to physical health and their effects on overall health and wellness.  Pt stated understanding.   Plan  Min 1 session >20 minutes during LOS  Recommendations for other services: None   Discharge Criteria: Patient will be discharged from TR if  patient refuses treatment 3 consecutive times without medical reason.  If treatment goals not met, if there is a change in medical status, if patient makes no progress towards goals or if patient is discharged from hospital.  The above assessment, treatment plan, treatment alternatives and goals were discussed and mutually agreed upon: by patient  [place] 02/02/2022, 3:00 PM

## 2022-02-02 NOTE — Progress Notes (Signed)
PROGRESS NOTE   Subjective/Complaints:  Sitting in chair this AM. Reports no new complaints.     Review of Systems  HENT:  Negative for congestion.   Respiratory:  Negative for shortness of breath.   Cardiovascular:  Negative for chest pain.  Gastrointestinal:  Negative for constipation, diarrhea, nausea and vomiting.  Neurological:  Positive for focal weakness.    Objective:   No results found. Recent Labs    02/01/22 0639  WBC 6.0  HGB 12.5  HCT 36.3  PLT 315    Recent Labs    02/01/22 0639  NA 137  K 4.2  CL 104  CO2 25  GLUCOSE 95  BUN 19  CREATININE 0.88  CALCIUM 9.0     Intake/Output Summary (Last 24 hours) at 02/02/2022 0746 Last data filed at 02/01/2022 2131 Gross per 24 hour  Intake 1056 ml  Output --  Net 1056 ml         Physical Exam: Vital Signs Blood pressure 123/71, pulse 73, temperature 98.7 F (37.1 C), temperature source Oral, resp. rate 18, height '5\' 6"'$  (1.676 m), weight 61.4 kg, SpO2 97 %.    Assessment/Plan: 1. Functional deficits which require 3+ hours per day of interdisciplinary therapy in a comprehensive inpatient rehab setting. Physiatrist is providing close team supervision and 24 hour management of active medical problems listed below. Physiatrist and rehab team continue to assess barriers to discharge/monitor patient progress toward functional and medical goals  Care Tool:  Bathing    Body parts bathed by patient: Right arm, Left arm, Chest, Abdomen, Front perineal area, Right upper leg, Left upper leg, Face   Body parts bathed by helper: Right lower leg, Left lower leg, Buttocks     Bathing assist Assist Level: Moderate Assistance - Patient 50 - 74%     Upper Body Dressing/Undressing Upper body dressing   What is the patient wearing?: Pull over shirt    Upper body assist Assist Level: Moderate Assistance - Patient 50 - 74%    Lower Body  Dressing/Undressing Lower body dressing      What is the patient wearing?: Pants, Incontinence brief     Lower body assist Assist for lower body dressing: Maximal Assistance - Patient 25 - 49%     Toileting Toileting    Toileting assist Assist for toileting: Total Assistance - Patient < 25%     Transfers Chair/bed transfer  Transfers assist     Chair/bed transfer assist level: Moderate Assistance - Patient 50 - 74%     Locomotion Ambulation   Ambulation assist      Assist level: 2 helpers Assistive device: Hand held assist Max distance: 200 ft   Walk 10 feet activity   Assist  Walk 10 feet activity did not occur: Safety/medical concerns  Assist level: 2 helpers Assistive device: Hand held assist   Walk 50 feet activity   Assist Walk 50 feet with 2 turns activity did not occur: Safety/medical concerns  Assist level: 2 helpers Assistive device: Hand held assist    Walk 150 feet activity   Assist Walk 150 feet activity did not occur: Safety/medical concerns  Assist level: 2 helpers Assistive device:  Hand held assist    Walk 10 feet on uneven surface  activity   Assist Walk 10 feet on uneven surfaces activity did not occur: Safety/medical concerns         Wheelchair     Assist Is the patient using a wheelchair?: Yes Type of Wheelchair: Manual    Wheelchair assist level: Total Assistance - Patient < 25% Max wheelchair distance: 150    Wheelchair 50 feet with 2 turns activity    Assist        Assist Level: Total Assistance - Patient < 25%   Wheelchair 150 feet activity     Assist      Assist Level: Total Assistance - Patient < 25%   Blood pressure 123/71, pulse 73, temperature 98.7 F (37.1 C), temperature source Oral, resp. rate 18, height '5\' 6"'$  (1.676 m), weight 61.4 kg, SpO2 97 %.    General: Alert, No apparent distress. Working with PT HEENT: Head is normocephalic,  PERRLA, EOMI, oral mucosa pink and  moist Neck: Supple Heart: Reg rate and rhythm. No murmurs or extra heart sounds. Chest: CTA bilaterally, normal effort Abdomen: Soft, non-tender, non-distended, bowel sounds positive. Extremities: No clubbing, cyanosis, or edema. Psych: Pt's affect is appropriate. Pleasant. Some impulsivity at times. Skin: no breakdown noted Neuro:  Alert and oriented x3.  Still has neglect of left side-improving. CN II-X12 grossly intact.  Sensation intact x4.  Musculoskeletal:  Increased tone in LUE elbow flexors and LLE hip adductors and hip and knee extensors, increased tone in ankle plantarflexors on Left and plantarflexion contraction  Medical Problem List and Plan: 1. Functional deficits secondary to intraparenchymal hematoma in the right rontal lobe             -patient may  shower             -ELOS/Goals: 12-14 days min A CGA, expected dc 5/31             -Continue CIR for PT, OT and SLP  -ST working on problem solving skills  -CT head after fall 5/15 without acute changes, discussed fall prevention  -increased tone in RLE especially plantarflexion of ankle interfering with gait  -Team conference today 2.  Antithrombotics: -DVT/anticoagulation:  Mechanical: Antiembolism stockings, thigh (TED hose) Bilateral lower extremities             No Lovenoxetc due to IPH             -antiplatelet therapy: N/A 3. Pain Management: 4. Mood: Effexor 75 mg daily             -antipsychotic agents: N/A  -5/24 reports she continues to have good mood overall, continue Effexor 5. Neuropsych: This patient is capable of making decisions on her own behalf. 6. Skin/Wound Care: Routine skin check 7. Fluids/Electrolytes/Nutrition: Routine in and out with follow-up chemistries  -5/11 midline removed yesterday 8.  Seizure disorder- new onset since IPH-.  Keppra 1000 mg every 12 hours 9.  Hypertension.  Cozaar 100 mg daily.  Monitor with increased mobility  Well controlled, continue cozaar           -5/23 BP well  controlled, continue Cozaar '100mg'$  daily    02/02/2022    5:34 AM 02/01/2022    7:50 PM 02/01/2022    2:27 PM  Vitals with BMI  Systolic 448 185 631  Diastolic 71 53 71  Pulse 73 85 100    10.  History of left breast cancer with radioactive seed localization  08/28/2020.  Follow-up outpatient 11. Hx of R frontal ICH 2017 12. Poor appetite- might need supplements or appetite stimulants  -5/10 albumin a little low, encourage PO intake             -5/22 Ate all breakfast this AM, continue to monitor 13. Diarrhea , improved  5/23 Normal BM this AM, follow 14. UTI     -UA on 5/18: + leuks, + nitrite, many bacteria, culture pending.      -Pt started on Bactrim DS on 01/28/22.       -5/22 no dysuria this AM, culture shows ecoli that was sensitive to bactrim, continue to monitor  -5/23 WBC wnl 15. Spasticity, increased tone in LLE particularly with ankle plantarflexors  -5/23 will order dorsiflexion night splint  -5/23 Tizanidine QHS started  -5/24 Continues to have increased tone, consider increase tizanidine tomorrow   LOS: 14 days A FACE TO FACE EVALUATION WAS PERFORMED  Jennye Boroughs 02/02/2022, 7:46 AM

## 2022-02-02 NOTE — Progress Notes (Signed)
Occupational Therapy Session Note  Patient Details  Name: Cynthia [name] MRN: [id number] Date of Birth: [birth year]  Today's Date: 02/02/2022 OT Individual Time: 9470-9628 OT Individual Time Calculation (min): 76 min    Short Term Goals: Week 2:  OT Short Term Goal 1 (Week 2): Pt will be able to transfer to toilet with mod A. OT Short Term Goal 2 (Week 2): Pt will be able to stand at toilet with mod A during clothing management. OT Short Term Goal 3 (Week 2): Pt will demonstrate improve dual tasking during shower to maintain her balance while washing UB. OT Short Term Goal 4 (Week 2): Pt will demonstrate improved L side awareness to don shirt with only 3 cues or less.  Skilled Therapeutic Interventions/Progress Updates:  Pt greeted seated in w/c agreeable to OT intervention. Session focus on BADL reeducation, functional mobility, dynamic standing balance, L sided attention, LUE coordination and decreasing overall caregiver burden.     Pt able to ambulate to bathroom with Rw and MOD A, needing assist to manage RW d/t pt running into doorway on L side, pt also had difficulty side stepping to L side needing MOD A and MAX tactile cues to sequence cues. Pt completed 3/3 toileting tasks with MOD A needing most assist for clothing mgmt d/t pt having on tight pants with small button.  Transported pt to gym for time mgmt where pt practiced L sided attention and functional ambulation with pt instructed to ambulate around room and collect clothespin on L and R side of gym, pt needed initial MAX multimodal cues to locate first pin but then able to locate and retrieve all clothespin with Rw and MINA.  Pt also worked on Marine scientist task with pt instructed to stand in front of mirror and hook clothespin onto her gait belt to simulate managing pants during toileting, pt able to complete task with no UE support and MIN A.   Pt also worked on L sided attention, problem solving, body awareness and  attention to task via trail making task on BITS. Pt completed task in 10 mins with 18 interruptions and 48 errors. Pt often noted to lose her place during task needing to use backward chaining to recall where she left off. Pt with good control of LUE during task able to take note of when her LUE began to drop. Pt needed + time to locate all numbers on board, especially on L side.  Pt ambulated back to room with Rw and MIN A. pt left seated in w/c with alarm belt activated and all needs within reach.                        Therapy Documentation Precautions:  Precautions Precautions: Fall Precaution Comments: L LE extensor tone Restrictions Weight Bearing Restrictions: No Other Position/Activity Restrictions: tendency to stand on toes on L foot due to extension tone/L ankle contracture  Pain: no pain reported during session   Therapy/Group: Individual Therapy  Precious Haws 02/02/2022, 3:42 PM

## 2022-02-02 NOTE — Progress Notes (Signed)
Speech Language Pathology Daily Session Note  Patient Details  Name: Cynthia [name] MRN: [id number] Date of Birth: 10/03/1945  Today's Date: 02/02/2022 SLP Individual Time: 1962-2297 SLP Individual Time Calculation (min): 60 min  Short Term Goals: Week 3: SLP Short Term Goal 1 (Week 3): STG's = LTG's due to ELOS  Skilled Therapeutic Interventions: Pt seen this date for skilled ST intervention targeting cognitive-linguistic goals outlined in care plan. Pt received awake/alert and OOB in w/c. Agreeable to ST intervention.  SLP facilitated today's session by providing ongoing education and reinforcement for use of compensatory executive functioning strategies to include double checking for accuracy, self-talk to organize and sequence thoughts and steps for task completion, and removing distractions. Pt utilized strategies across functional tx tasks given Min-Mod A. Provided pt with new memory notebook, which pt wrote AM information in given Mod A model prompts for organization of information and spacing. Completed recall task re: "grocery list" with 100% accuracy given Min to Mod A; 60% accuracy with Mod I following 5-minute delay with distractions. Completed mental math calculation task with 5 grocery items, as well as basic decision-making and problem-solving with 100% accuracy given overall Min-Mod A. States good insight into challenge level of task, as well as helpful interventions.   Pt returned to room and left OOB in w/c with all safety measures activated. Call bell reviewed and within reach, and all immediate needs met. Continue per current ST POC.   Pain Denies pain; NAD appreciated  Therapy/Group: Individual Therapy  Harpreet Signore A Hilja Kintzel 02/02/2022, 12:45 PM

## 2022-02-03 MED ORDER — TIZANIDINE HCL 2 MG PO TABS
2.0000 mg | ORAL_TABLET | Freq: Two times a day (BID) | ORAL | Status: DC
  Administered 2022-02-03 – 2022-02-08 (×11): 2 mg via ORAL
  Filled 2022-02-03 (×11): qty 1

## 2022-02-03 NOTE — Progress Notes (Signed)
Speech Language Pathology Daily Session Note  Patient Details  Name: Cynthia [name] MRN: [id number] Date of Birth: Feb 19, 1946  Today's Date: 02/03/2022 SLP Individual Time: 0918-1000 SLP Individual Time Calculation (min): 42 min  Short Term Goals: Week 3: SLP Short Term Goal 1 (Week 3): STG's = LTG's due to ELOS  Skilled Therapeutic Interventions: Pt seen this date for skilled ST intervention targeting cognitive-linguistic goals outlined in care plan. Pt received alert/awake and OOB in w/c. Writing in memory notebook. Agreeable to ST intervention at bedside.  SLP facilitated today's session by providing overall Mod A for implementation of compensatory L visual attention strategies to include lighthouse scanning and visual anchor. During structured visual scanning task, pt demonstrated improved performance (100% accuracy) with use of strategies and Mod I. Pt with functional recall of SW visit from yesterday and activities completed in PT session with Mod I. Sup A for use of memory notebook to recall grocery list from yesterday's ST session. Completed mildly complex, verbal problem-solving tasks with Min to Mod A and Mod A for error awareness.   Pt left in w/c with all safety measures activated. Direct hand off to RN. Continue per current ST POC.  Pain No/Denies pain; NAD appreciated   Therapy/Group: Individual Therapy  Cynthia [name] A Donevin Sainsbury 02/03/2022, 12:29 PM

## 2022-02-03 NOTE — Progress Notes (Signed)
Physical Therapy Session Note  Patient Details  Name: Cynthia [name] MRN: [id number] Date of Birth: Sep 03, 1946  Today's Date: 02/03/2022 PT Individual Time: 0800-0905 PT Individual Time Calculation (min): 65 min   Short Term Goals: Week 3:  PT Short Term Goal 1 (Week 3): STG=LTG due to ELOS.  Skilled Therapeutic Interventions/Progress Updates:     Patient in bed upon PT arrival. Patient alert and agreeable to PT session. Patient denied pain during session.  Therapeutic Activity: Bed Mobility: Patient performed supine to sit with supervision in a flat bed without use of bed rails. Provided verbal cues for attention to L hemi-body placement. Patient donned socks with set-up assist and pants and shoes with total A for energy/time management. Transfers: Patient performed sit to/from stand from the bed x1, toilet x1, and standard arm chair x1 with mod A x1 due to increased posterior bias and min A for all other using a RW. Provided verbal cues for L foot placement, forward weight shift, hand placement, and increased hip/knee/trunk extension L>R. Patient was continent of bladder during toileting, performed peri-care independently and lower body clothing management with total A due to balance.  Gait Training:  Patient ambulated >100 feet x2 using RW with DF assist wrap with eversion bias with min A for RW management and intermittent balance assist. Ambulated with lateral forefoot at initial contact with increased L PF with inversion throughout, decreased L weight shift, forward trunk lean, and downward head gaze. Provided verbal cues for sequencing, increased L hamstring activation in pre-swing, heel strike at initial contact, erect posture, looking ahead, and facilitation and cues for management of AD.  Neuromuscular Re-ed: Patient performed the following standing balance activities for increased weight bearing through L lower extremity for tone management and increased balance  strategies: -standing balance >2 min with RW progressing from min A-CGA -patient stood at the sink without upper extremity support with CGA-close supervision >6 min while performing hand-hygiene, oral care, and unbraiding and brushing her hair, PT provided increased approximation at her L ankle for DF with progress to heel contact at the floor with PT blocking her knee to prevent hyperextension  Patient in w/c in the room at end of session with breaks locked, seat belt alarm set, and all needs within reach.   Therapy Documentation Precautions:  Precautions Precautions: Fall Precaution Comments: L LE extensor tone Restrictions Weight Bearing Restrictions: No Other Position/Activity Restrictions: tendency to stand on toes on L foot due to extension tone/L ankle contracture   Therapy/Group: Individual Therapy  Nawaf Strange L Qasim Diveley PT, DPT  02/03/2022, 12:29 PM

## 2022-02-03 NOTE — Progress Notes (Signed)
Occupational Therapy Session Note  Patient Details  Name: Cynthia [name] MRN: [id number] Date of Birth: 06-Oct-1945  Today's Date: 02/03/2022 OT Group Time: 1100-1211 OT Group Time Calculation (min): 71 min   Short Term Goals: Week 2:  OT Short Term Goal 1 (Week 2): Pt will be able to transfer to toilet with mod A. OT Short Term Goal 2 (Week 2): Pt will be able to stand at toilet with mod A during clothing management. OT Short Term Goal 3 (Week 2): Pt will demonstrate improve dual tasking during shower to maintain her balance while washing UB. OT Short Term Goal 4 (Week 2): Pt will demonstrate improved L side awareness to don shirt with only 3 cues or less.  Skilled Therapeutic Interventions/Progress Updates:  Pt participated in group session with a focus on stress mgmt, education on healthy coping strategies, and social interaction. Focus of session on providing coping strategies to manage new diagnosis to allow for improved mental health to increase overall quality of life . Discussed how to break down stressors into "daily hassles," "major life stressors" and "life circumstances" in an effort to allow pts to chunk their stressors into groups and determine where to best put their efforts/time when dealing with stress. Pt actively sharing stressors and contributing to group conversation. Provided active listening, emotional support and therapeutic use of self. Offered education on factors that protect [place] against stress such as "daily uplifts," "healthy coping strategies" and "protective factors." Encouraged all group members to make an effort to actively recall one event from their day that was a daily uplift in an effort to protect their mindset from stressors as well as sharing this information with their caregivers to facilitate improved caregiver communication and decrease overall burden of care.  Pt reports feeling much more relaxed after attending this group. Issued pt handouts on healthy  coping strategies to implement into routine. Pt transported back to room by OTR.  Therapy Documentation Precautions:  Precautions Precautions: Fall Precaution Comments: L LE extensor tone Restrictions Weight Bearing Restrictions: No Other Position/Activity Restrictions: tendency to stand on toes on L foot due to extension tone/L ankle contracture   Pain: No pain reported during session    Therapy/Group: Group Therapy  Precious Haws 02/03/2022, 12:23 PM

## 2022-02-03 NOTE — Progress Notes (Signed)
Occupational Therapy Session Note  Patient Details  Name: Carollyn Etcheverry MRN: [id number] Date of Birth: 21-Nov-1945  Today's Date: 02/03/2022 OT Individual Time: 8309-4076 OT Individual Time Calculation (min): 42 min    Short Term Goals: Week 2:  OT Short Term Goal 1 (Week 2): Pt will be able to transfer to toilet with mod A. OT Short Term Goal 2 (Week 2): Pt will be able to stand at toilet with mod A during clothing management. OT Short Term Goal 3 (Week 2): Pt will demonstrate improve dual tasking during shower to maintain her balance while washing UB. OT Short Term Goal 4 (Week 2): Pt will demonstrate improved L side awareness to don shirt with only 3 cues or less.  Skilled Therapeutic Interventions/Progress Updates:    Patient received sitting up in wheelchair.  Patient sharing from OT group earlier today.  Enjoyed hearing other's talk about their situations, and felt it was helpful to process in a group setting.  Patient asking to walk.  Wrapped ankle with ace wrap, and walked with walker to bathroom. Patient walking with min assist on straight path, but needing cueing and assist for walker management for turning, navigating direction changes, backing up etc.  Patient walked away from walker once sight on commode - very receptive to cueing, although anticipate minimal carryover without much more repetition.   Patient left up in wheelchair with safety belt in place and engaged and nurse call bell in lap.    Therapy Documentation Precautions:  Precautions Precautions: Fall Precaution Comments: L LE extensor tone Restrictions Weight Bearing Restrictions: No Other Position/Activity Restrictions: tendency to stand on toes on L foot due to extension tone/L ankle contracture  Pain:  No pain     Therapy/Group: Individual Therapy  Mariah Milling 02/03/2022, 3:04 PM

## 2022-02-03 NOTE — Progress Notes (Addendum)
PROGRESS NOTE   Subjective/Complaints:  Working with therapy this AM. Reports no new complaints.   Reports muscle tightness is improved.  Review of Systems  HENT:  Negative for congestion.   Respiratory:  Negative for shortness of breath.   Cardiovascular:  Negative for chest pain.  Gastrointestinal:  Negative for constipation, diarrhea, nausea and vomiting.  Neurological:  Positive for focal weakness.  Psychiatric/Behavioral:  Negative for depression.     Objective:   No results found. Recent Labs    02/01/22 0639  WBC 6.0  HGB 12.5  HCT 36.3  PLT 315    Recent Labs    02/01/22 0639  NA 137  K 4.2  CL 104  CO2 25  GLUCOSE 95  BUN 19  CREATININE 0.88  CALCIUM 9.0     Intake/Output Summary (Last 24 hours) at 02/03/2022 0753 Last data filed at 02/02/2022 1752 Gross per 24 hour  Intake 720 ml  Output --  Net 720 ml         Physical Exam: Vital Signs Blood pressure (!) 119/58, pulse 69, temperature 97.8 F (36.6 C), temperature source Oral, resp. rate 17, height '5\' 6"'$  (1.676 m), weight 61.4 kg, SpO2 97 %.    Assessment/Plan: 1. Functional deficits which require 3+ hours per day of interdisciplinary therapy in a comprehensive inpatient rehab setting. Physiatrist is providing close team supervision and 24 hour management of active medical problems listed below. Physiatrist and rehab team continue to assess barriers to discharge/monitor patient progress toward functional and medical goals  Care Tool:  Bathing    Body parts bathed by patient: Right arm, Left arm, Chest, Abdomen, Front perineal area, Right upper leg, Left upper leg, Face   Body parts bathed by helper: Right lower leg, Left lower leg, Buttocks     Bathing assist Assist Level: Moderate Assistance - Patient 50 - 74%     Upper Body Dressing/Undressing Upper body dressing   What is the patient wearing?: Pull over shirt    Upper  body assist Assist Level: Moderate Assistance - Patient 50 - 74%    Lower Body Dressing/Undressing Lower body dressing      What is the patient wearing?: Pants, Incontinence brief     Lower body assist Assist for lower body dressing: Maximal Assistance - Patient 25 - 49%     Toileting Toileting    Toileting assist Assist for toileting: Moderate Assistance - Patient 50 - 74%     Transfers Chair/bed transfer  Transfers assist     Chair/bed transfer assist level: Minimal Assistance - Patient > 75%     Locomotion Ambulation   Ambulation assist      Assist level: 2 helpers Assistive device: Hand held assist Max distance: 200 ft   Walk 10 feet activity   Assist  Walk 10 feet activity did not occur: Safety/medical concerns  Assist level: 2 helpers Assistive device: Hand held assist   Walk 50 feet activity   Assist Walk 50 feet with 2 turns activity did not occur: Safety/medical concerns  Assist level: 2 helpers Assistive device: Hand held assist    Walk 150 feet activity   Assist Walk 150 feet activity  did not occur: Safety/medical concerns  Assist level: 2 helpers Assistive device: Hand held assist    Walk 10 feet on uneven surface  activity   Assist Walk 10 feet on uneven surfaces activity did not occur: Safety/medical concerns         Wheelchair     Assist Is the patient using a wheelchair?: Yes Type of Wheelchair: Manual    Wheelchair assist level: Total Assistance - Patient < 25% Max wheelchair distance: 150    Wheelchair 50 feet with 2 turns activity    Assist        Assist Level: Total Assistance - Patient < 25%   Wheelchair 150 feet activity     Assist      Assist Level: Total Assistance - Patient < 25%   Blood pressure (!) 119/58, pulse 69, temperature 97.8 F (36.6 C), temperature source Oral, resp. rate 17, height '5\' 6"'$  (1.676 m), weight 61.4 kg, SpO2 97 %.    General: Alert, No apparent distress.  Working with PT HEENT: Head is normocephalic,  PERRLA, EOMI, oral mucosa pink and moist Neck: Supple Heart: Reg rate and rhythm. No murmurs or extra heart sounds. Chest: CTA bilaterally, normal effort Abdomen: Soft, non-tender, non-distended, bowel sounds positive. Extremities: No clubbing, cyanosis, or edema. Psych: Pt's affect is appropriate. Pleasant. Some impulsivity at times. Skin: no breakdown noted Neuro:  Alert and oriented x3.  Still has neglect of left side-improving. CN II-X12 grossly intact.  Sensation intact x4.  Musculoskeletal:  Increased tone in LUE elbow flexors and LLE hip adductors and hip and knee extensors, increased tone in ankle plantarflexors on Left - tone decreased compared to yesterday  Medical Problem List and Plan: 1. Functional deficits secondary to intraparenchymal hematoma in the right rontal lobe             -patient may  shower             -ELOS/Goals: 12-14 days min A CGA, expected dc 5/31,   -SNF option is being investigated             -Continue CIR for PT, OT and SLP  -ST working on problem solving skills  -CT head after fall 5/15 without acute changes, discussed fall prevention  -increased tone in RLE especially plantarflexion of ankle interfering with gait 2.  Antithrombotics: -DVT/anticoagulation:  Mechanical: Antiembolism stockings, thigh (TED hose) Bilateral lower extremities             No Lovenoxetc due to IPH             -antiplatelet therapy: N/A 3. Pain Management: 4. Mood: Effexor 75 mg daily             -antipsychotic agents: N/A  -5/24 reports she continues to have good mood overall, continue Effexor 5. Neuropsych: This patient is capable of making decisions on her own behalf. 6. Skin/Wound Care: Routine skin check 7. Fluids/Electrolytes/Nutrition: Routine in and out with follow-up chemistries  -5/11 midline removed yesterday 8.  Seizure disorder- new onset since IPH-.  Keppra 1000 mg every 12 hours 9.  Hypertension.  Cozaar 100 mg  daily.  Monitor with increased mobility           -5/25 BP well controlled, continue cozaar '100mg'$  dialy    02/03/2022    4:58 AM 02/02/2022    7:29 PM 02/02/2022    2:20 PM  Vitals with BMI  Systolic 283 151 761  Diastolic 58 62 63  Pulse 69 82 94  10.  History of left breast cancer with radioactive seed localization 08/28/2020.  Follow-up outpatient 11. Hx of R frontal ICH 2017 12. Poor appetite- might need supplements or appetite stimulants  -5/10 albumin a little low, encourage PO intake             -5/25 She is eating most of her meals, follow 13. Diarrhea , improved  5/23 Normal BM this AM, follow 14. UTI     -UA on 5/18: + leuks, + nitrite, many bacteria, culture pending.      -Pt started on Bactrim DS on 01/28/22.       -5/22 no dysuria this AM, culture shows ecoli that was sensitive to bactrim, continue to monitor  -5/23 WBC wnl 15. Spasticity, increased tone in LLE particularly with ankle plantarflexors  -5/23 will order dorsiflexion night splint  -5/23 Tizanidine QHS started  -5/25 Increase Tizanidine to '2mg'$  BID, tone has improved with medication   LOS: 15 days A FACE TO [place] 02/03/2022, 7:53 AM

## 2022-02-03 NOTE — Progress Notes (Signed)
Recreational Therapy Session Note  Patient Details  Name: Ashtynn Berke MRN: [id number] Date of Birth: 1946-08-06 Today's Date: 02/03/2022  Pain: no c/o Skilled Therapeutic Interventions/Progress Updates: Pt actively participated in stress managment/coping group today.  Pt education/discussion focused on stress exploration including factors that contribute to stress, factors that protect against stress and potential coping strategies.  Coping strategies included deep breathing, progressive muscle relaxation, imagery & challenging irrational thoughts.  Handouts provided.    Therapy/Group: Group Therapy   Michall Noffke 02/03/2022, 4:07 PM

## 2022-02-04 DIAGNOSIS — K5900 Constipation, unspecified: Secondary | ICD-10-CM

## 2022-02-04 MED ORDER — POLYETHYLENE GLYCOL 3350 17 G PO PACK: 17.0000 g | PACK | Freq: Every day | ORAL | Status: DC | PRN

## 2022-02-04 NOTE — Progress Notes (Signed)
Patient ID: Cynthia [name], female   DOB: Jul 05, 1946, 75 y.o.   MRN: [id number]  Spoke with son-Matthew to discuss discharge plan with Mom since she is feeling she can manage at home from discharge from here. He will talk with her this weekend. Will send out FL2 and look to see if any bed offers.  Will ask MD to talk with pt also. Pt does need to agree with moving forward on pursuing SNF

## 2022-02-04 NOTE — Discharge Summary (Incomplete)
Physician Discharge Summary  Patient ID: Cynthia [name] MRN: [id number] DOB/AGE: 74-Feb-1947 74 y.o.  Admit date: 01/19/2022 Discharge date: 02/09/2022  Discharge Diagnoses:  Principal Problem:   Intracerebral hemorrhage Active Problems:   ICH (intracerebral hemorrhage) (HCC)   Left-sided visual neglect   Partial symptomatic epilepsy with complex partial seizures, not intractable, without status epilepticus ([facility])   Mood disorder in conditions classified elsewhere   Poor appetite Hypertension History of left breast cancer History of right frontal ICH 2017 UTI  Discharged Condition: Stable  Significant Diagnostic Studies: CT HEAD WO CONTRAST (5MM)  Result Date: 01/24/2022 CLINICAL DATA:  Known right frontal intraparenchymal hematoma, fell and hit head this evening EXAM: CT HEAD WITHOUT CONTRAST TECHNIQUE: Contiguous axial images were obtained from the base of the skull through the vertex without intravenous contrast. RADIATION DOSE REDUCTION: This exam was performed according to the departmental dose-optimization program which includes automated exposure control, adjustment of the mA and/or kV according to patient size and/or use of iterative reconstruction technique. COMPARISON:  01/11/2022, 01/13/2022 FINDINGS: Brain: Decreased prominence of the right frontal pericallosal intraparenchymal hematoma and associated subarachnoid hemorrhage seen previously, with the intraparenchymal hemorrhage now measuring 1.9 x 1.3 x 2.1 cm, where previously this had measured approximately 3.2 x 2.4 by 3.2 cm. There is persistent but decreasing vasogenic edema within the right frontal lobe. No new areas of infarct or hemorrhage. The lateral ventricles and remaining midline structures are stable, with extensive chronic small-vessel ischemic changes seen elsewhere throughout the periventricular white matter. No mass effect or midline shift. Vascular: No hyperdense vessel or unexpected calcification. Skull:  Normal. Negative for fracture or focal lesion. Sinuses/Orbits: No acute finding. Other: None. IMPRESSION: 1. Improving right frontal pericallosal intraparenchymal hematoma and adjacent subarachnoid hemorrhage, with decreased surrounding vasogenic edema. No mass effect or midline shift. 2. No new infarct or hemorrhage. Electronically Signed   By: Randa Ngo M.D.   On: 01/24/2022 21:06   CT HEAD WO CONTRAST (5MM)  Result Date: 01/11/2022 CLINICAL DATA:  Seizure, generalized, abnormal neuro exam (Ped 0-17y) EXAM: CT HEAD WITHOUT CONTRAST TECHNIQUE: Contiguous axial images were obtained from the base of the skull through the vertex without intravenous contrast. RADIATION DOSE REDUCTION: This exam was performed according to the departmental dose-optimization program which includes automated exposure control, adjustment of the mA and/or kV according to patient size and/or use of iterative reconstruction technique. COMPARISON:  CT head Jan 10, 2022. FINDINGS: Brain: Stable size of an intraparenchymal hemorrhage in the right parasagittal frontal lobe. Similar surrounding edema and regional mass effect with possible slight extension of hemorrhage into the adjacent extra-axial space. No midline shift. Similar ventriculomegaly. No evidence of acute large vascular territory infarct. Patchy white matter hypoattenuation, nonspecific but compatible with chronic microvascular ischemic disease. Cerebral atrophy. Vascular: No hyperdense vessel identified. Skull: No acute fracture. Sinuses/Orbits: Right maxillary sinus mucosal thickening frothy secretions. Otherwise, clear sinuses. No acute orbital findings. Other: No mastoid effusions. IMPRESSION: Unchanged high right parasagittal frontal lobe intraparenchymal hemorrhage with similar surrounding edema and mass effect. Electronically Signed   By: Margaretha Sheffield M.D.   On: 01/11/2022 14:08   CT CERVICAL SPINE WO CONTRAST  Result Date: 01/10/2022 CLINICAL DATA:  Neck trauma,  uncomplicated (NEXUS/CCR neg) (Age 80-64y); Facial trauma, blunt EXAM: CT MAXILLOFACIAL WITHOUT CONTRAST CT CERVICAL SPINE WITHOUT CONTRAST TECHNIQUE: Multidetector CT imaging of the maxillofacial structures was performed. Multiplanar CT image reconstructions were also generated. A small metallic BB was placed on the right temple in order to reliably differentiate right from  left. Multidetector CT imaging of the cervical spine was performed without intravenous contrast. Multiplanar CT image reconstructions were also generated. RADIATION DOSE REDUCTION: This exam was performed according to the departmental dose-optimization program which includes automated exposure control, adjustment of the mA and/or kV according to patient size and/or use of iterative reconstruction technique. COMPARISON:  Same day head CT, CT head and neck 03/10/2021 FINDINGS: CT MAXILLOFACIAL FINDINGS Osseous: No fracture or mandibular dislocation. No destructive process. Orbits: Negative. No traumatic or inflammatory finding. Sinuses: Small amount of frothy material in the right maxillary sinuses. The other paranasal sinuses are predominantly clear. Soft tissues: Mild left periorbital soft tissue swelling. Partially visualized intraparenchymal edema associated with an intraparenchymal hematoma as seen on separately dictated head CT for intracranial findings. CT CERVICAL SPINE FINDINGS Alignment: Trace degenerative anterolisthesis at C3-C4, retrolisthesis at C4-C5, and grade 1 anterolisthesis at C6-C7. Skull base and vertebrae: No acute cervical spine fracture no acute osseous lesion. Soft tissues and spinal canal: No prevertebral fluid or swelling. No visible canal hematoma. Disc levels: There is multilevel degenerative disc disease, mild at C3-C4, moderate at C4-C5, and severe at C5-C6. Multilevel facet arthropathy of varying degrees of severity. Upper chest: Unchanged 2.1 cm hypodense right thyroid nodule. Other: None IMPRESSION: No acute  facial fracture. Mild left periorbital soft tissue swelling. No acute cervical spine fracture. Please see separately dictated head CT for intracranial findings. Electronically Signed   By: Maurine Simmering M.D.   On: 01/10/2022 12:26   MR ANGIO HEAD WO CONTRAST  Result Date: 01/13/2022 CLINICAL DATA:  Seizure.  Intracranial hemorrhage EXAM: MRI HEAD WITHOUT CONTRAST MRA HEAD WITHOUT CONTRAST TECHNIQUE: Multiplanar, multi-echo pulse sequences of the brain and surrounding structures were acquired without intravenous contrast. Angiographic images of the Circle of Willis were acquired using MRA technique without intravenous contrast. COMPARISON:  CT head 01/11/2022 FINDINGS: MRI HEAD FINDINGS Brain: Acute/subacute right frontal pericallosal hematoma is unchanged from the CT measuring approximately 3 cm in diameter. Mild mass-effect on the corpus callosum and right lateral ventricle. Chronic hemorrhagic infarct right medial parietal lobe. Generalized atrophy. Moderate chronic microvascular ischemic changes throughout the white matter and pons. No mass lesion identified. Negative for hydrocephalus. No midline shift. Vascular: Normal arterial flow voids. Skull and upper cervical spine: No focal skeletal abnormality. Sinuses/Orbits: Mild mucosal edema right maxillary sinus. Remaining sinuses clear. Negative orbit Other: None MRA HEAD FINDINGS Anterior circulation: Internal carotid artery widely patent through the cavernous segment. Anterior and middle cerebral arteries normal bilaterally. Posterior circulation: Posterior circulation widely patent without stenosis or occlusion. Fetal origin right posterior cerebral artery. Anatomic variants: Negative for aneurysm IMPRESSION: Acute/subacute hematoma right pericallosal frontal lobe unchanged from CT. Mild local mass-effect. Chronic hemorrhagic infarct right medial parietal lobe. Moderate chronic microvascular ischemic change in the white matter and pons. No acute ischemic  infarct. Negative MRA head Electronically Signed   By: Franchot Gallo M.D.   On: 01/13/2022 15:31   MR BRAIN WO CONTRAST  Result Date: 01/13/2022 CLINICAL DATA:  Seizure.  Intracranial hemorrhage EXAM: MRI HEAD WITHOUT CONTRAST MRA HEAD WITHOUT CONTRAST TECHNIQUE: Multiplanar, multi-echo pulse sequences of the brain and surrounding structures were acquired without intravenous contrast. Angiographic images of the Circle of Willis were acquired using MRA technique without intravenous contrast. COMPARISON:  CT head 01/11/2022 FINDINGS: MRI HEAD FINDINGS Brain: Acute/subacute right frontal pericallosal hematoma is unchanged from the CT measuring approximately 3 cm in diameter. Mild mass-effect on the corpus callosum and right lateral ventricle. Chronic hemorrhagic infarct right medial parietal lobe.  Generalized atrophy. Moderate chronic microvascular ischemic changes throughout the white matter and pons. No mass lesion identified. Negative for hydrocephalus. No midline shift. Vascular: Normal arterial flow voids. Skull and upper cervical spine: No focal skeletal abnormality. Sinuses/Orbits: Mild mucosal edema right maxillary sinus. Remaining sinuses clear. Negative orbit Other: None MRA HEAD FINDINGS Anterior circulation: Internal carotid artery widely patent through the cavernous segment. Anterior and middle cerebral arteries normal bilaterally. Posterior circulation: Posterior circulation widely patent without stenosis or occlusion. Fetal origin right posterior cerebral artery. Anatomic variants: Negative for aneurysm IMPRESSION: Acute/subacute hematoma right pericallosal frontal lobe unchanged from CT. Mild local mass-effect. Chronic hemorrhagic infarct right medial parietal lobe. Moderate chronic microvascular ischemic change in the white matter and pons. No acute ischemic infarct. Negative MRA head Electronically Signed   By: Franchot Gallo M.D.   On: 01/13/2022 15:31   EEG adult  Result Date:  01/11/2022 Lora Havens, MD     01/11/2022  4:09 PM Patient Name: Cynthia [name] MRN: [id number] Epilepsy Attending: Lora Havens Referring Physician/Provider: Rosalin Hawking, MD Date: 01/11/2022 Duration: 21.36 mins Patient history: 65 old female had an episode of left arm shaking followed by generalized tonic-clonic seizure.  EEG evaluate for seizure. Level of alertness: Asleep AEDs during EEG study: Keppra, Ativan Technical aspects: This EEG study was done with scalp electrodes positioned according to the 10-20 International system of electrode placement. Electrical activity was acquired at a sampling rate of '500Hz'$  and reviewed with a high frequency filter of '70Hz'$  and a low frequency filter of '1Hz'$ . EEG data were recorded continuously and digitally stored. Description: Sleep was characterized by vertex waves, sleep spindles (12 to 14 Hz), maximal frontocentral region. EEG showed continuous 5-'6hz'$  theta-delta slowing in right hemisphere. Hyperventilation and photic stimulation were not performed.   ABNORMALITY - Continuous slow, right hemisphere IMPRESSION: This study during sleep only is suggestive of cortical dysfunction arising from right hemisphere likely secondary to underlying ICH. No seizures or epileptiform discharges were seen throughout the recording. Priyanka Barbra Sarks    Overnight EEG with video  Result Date: 01/12/2022 Lora Havens, MD     01/12/2022  3:55 PM Patient Name: Cynthia [name] MRN: [id number] Epilepsy Attending: Lora Havens Referring Physician/Provider: Rosalin Hawking, MD Duration: 01/11/2022 1555 to 01/12/2022 1555  Patient history: 34 old female had an episode of left arm shaking followed by generalized tonic-clonic seizure.  EEG evaluate for seizure.  Level of alertness: awake, Asleep  AEDs during EEG study: Keppra  Technical aspects: This EEG study was done with scalp electrodes positioned according to the 10-20 International system of electrode placement. Electrical activity was  acquired at a sampling rate of '500Hz'$  and reviewed with a high frequency filter of '70Hz'$  and a low frequency filter of '1Hz'$ . EEG data were recorded continuously and digitally stored.  Description: The posterior dominant rhythm consists of 8-9 Hz activity of moderate voltage (25-35 uV) seen predominantly in posterior head regions, symmetric and reactive to eye opening and eye closing. Sleep was characterized by vertex waves, sleep spindles (12 to 14 Hz), maximal frontocentral region. EEG showed continuous generalized and maximal right frontotemporal region 5-'6hz'$  theta-delta slowing. Hyperventilation and photic stimulation were not performed.   Seizure without clinical signs were noted on 01/11/2022 after 2100 on 01/11/2022, average 2 seizure/hour lasting 1-2 minutes each. During the seizure, EEG initially showed 4 to 5 Hz theta-delta slowing in right frontotemporal region which then involved all of right hemisphere and left hemisphere and evolved into 2 to 3  Hz delta slowing.  Last seizure was noted on 01/12/2022 at 1218  ABNORMALITY - Seizure without clinical signs, right frontotemporal region - Continuous slow, generalized and maximal right frontotemporal region  IMPRESSION: This study showed seizures without clinical signs, average 2 seizures per hour, lasting about 1 to 2 minutes each arising from right frontotemporal region.  Last seizure was noted on 01/12/2022 at 1218. Aditionally there is cortical dysfunction in right frontotemporal region likely secondary to underlying ICH.  Lora Havens    ECHOCARDIOGRAM COMPLETE  Result Date: 01/10/2022    ECHOCARDIOGRAM REPORT   Patient Name:   Cynthia [name] Date of Exam: 01/10/2022 Medical Rec #:  [id number]         Height:       66.0 in Accession #:    [id number]        Weight:       139.3 lb Date of Birth:  Dec 13, 1945         BSA:          1.715 m Patient Age:    53 years          BP:           145/76 mmHg Patient Gender: F                 HR:           89 bpm. Exam  Location:  Inpatient Procedure: 2D Echo, Color Doppler and Intracardiac Opacification Agent Indications:   CVA  History:       Patient has no prior history of Echocardiogram examinations.                Stroke.  Sonographer:   Luisa Hart RDCS Referring      [id number] Rosalin Hawking Phys: IMPRESSIONS  1. Left ventricular ejection fraction, by estimation, is 60 to 65%. The left ventricle has normal function. The left ventricle has no regional wall motion abnormalities. Left ventricular diastolic parameters were normal.  2. Right ventricular systolic function is normal. The right ventricular size is normal. There is normal pulmonary artery systolic pressure.  3. Right atrial size was mildly dilated.  4. The mitral valve was not well visualized. Trivial mitral valve regurgitation. No evidence of mitral stenosis.  5. The aortic valve was not well visualized. There is mild calcification of the aortic valve. Aortic valve regurgitation is not visualized. Aortic valve sclerosis is present, with no evidence of aortic valve stenosis.  6. The inferior vena cava is normal in size with <50% respiratory variability, suggesting right atrial pressure of 8 mmHg. Comparison(s): No prior Echocardiogram. Conclusion(s)/Recommendation(s): Otherwise normal echocardiogram, with minor abnormalities described in the report. No intracardiac source of embolism detected on this transthoracic study. Consider a transesophageal echocardiogram to exclude cardiac source of embolism if clinically indicated. FINDINGS  Left Ventricle: Left ventricular ejection fraction, by estimation, is 60 to 65%. The left ventricle has normal function. The left ventricle has no regional wall motion abnormalities. Definity contrast agent was given IV to delineate the left ventricular  endocardial borders. The left ventricular internal cavity size was normal in size. There is no left ventricular hypertrophy. Left ventricular diastolic parameters were normal. Right  Ventricle: The right ventricular size is normal. Right vetricular wall thickness was not well visualized. Right ventricular systolic function is normal. There is normal pulmonary artery systolic pressure. The tricuspid regurgitant velocity is 2.59 m/s, and with an assumed right atrial pressure of 8 mmHg, the estimated right ventricular systolic pressure  is 34.8 mmHg. Left Atrium: Left atrial size was normal in size. Right Atrium: Right atrial size was mildly dilated. Pericardium: There is no evidence of pericardial effusion. Mitral Valve: The mitral valve was not well visualized. Trivial mitral valve regurgitation. No evidence of mitral valve stenosis. Tricuspid Valve: The tricuspid valve is not well visualized. Tricuspid valve regurgitation is trivial. No evidence of tricuspid stenosis. Aortic Valve: The aortic valve was not well visualized. There is mild calcification of the aortic valve. Aortic valve regurgitation is not visualized. Aortic valve sclerosis is present, with no evidence of aortic valve stenosis. Aortic valve mean gradient measures 3.0 mmHg. Aortic valve peak gradient measures 6.0 mmHg. Aortic valve area, by VTI measures 1.80 cm. Pulmonic Valve: The pulmonic valve was not well visualized. Pulmonic valve regurgitation is not visualized. No evidence of pulmonic stenosis. Aorta: The aortic root, ascending aorta, aortic arch and descending aorta are all structurally normal, with no evidence of dilitation or obstruction. Venous: The inferior vena cava is normal in size with less than 50% respiratory variability, suggesting right atrial pressure of 8 mmHg. IAS/Shunts: The interatrial septum was not well visualized.  LEFT VENTRICLE PLAX 2D LVIDd:         4.10 cm     Diastology LVIDs:         2.80 cm     LV e' medial:    6.08 cm/s LV PW:         0.90 cm     LV E/e' medial:  9.0 LV IVS:        0.80 cm     LV e' lateral:   6.30 cm/s LVOT diam:     1.70 cm     LV E/e' lateral: 8.7 LV SV:         39 LV SV  Index:   23 LVOT Area:     2.27 cm  LV Volumes (MOD) LV vol d, MOD A2C: 26.8 ml LV vol d, MOD A4C: 25.8 ml LV vol s, MOD A2C: 5.8 ml LV vol s, MOD A4C: 9.8 ml LV SV MOD A2C:     21.0 ml LV SV MOD A4C:     25.8 ml LV SV MOD BP:      18.8 ml RIGHT VENTRICLE RV Basal diam:  3.40 cm RV Mid diam:    2.20 cm RV S prime:     12.10 cm/s TAPSE (M-mode): 1.5 cm LEFT ATRIUM             Index        RIGHT ATRIUM           Index LA diam:        2.90 cm 1.69 cm/m   RA Area:     17.00 cm LA Vol (A2C):   26.0 ml 15.16 ml/m  RA Volume:   49.80 ml  29.04 ml/m LA Vol (A4C):   49.3 ml 28.72 ml/m LA Biplane Vol: 37.9 ml 22.10 ml/m  AORTIC VALVE                    PULMONIC VALVE AV Area (Vmax):    1.85 cm     PV Vmax:       0.81 m/s AV Area (Vmean):   1.90 cm     PV Vmean:      53.500 cm/s AV Area (VTI):     1.80 cm     PV VTI:        0.126 m AV  Vmax:           122.00 cm/s  PV Peak grad:  2.7 mmHg AV Vmean:          79.900 cm/s  PV Mean grad:  1.0 mmHg AV VTI:            0.218 m AV Peak Grad:      6.0 mmHg AV Mean Grad:      3.0 mmHg LVOT Vmax:         99.40 cm/s LVOT Vmean:        66.900 cm/s LVOT VTI:          0.173 m LVOT/AV VTI ratio: 0.79  AORTA Ao Root diam: 3.00 cm Ao Asc diam:  3.40 cm MITRAL VALVE               TRICUSPID VALVE MV Area (PHT): 3.42 cm    TR Peak grad:   26.8 mmHg MV Decel Time: 222 msec    TR Vmax:        259.00 cm/s MV E velocity: 54.80 cm/s MV A velocity: 89.20 cm/s  SHUNTS MV E/A ratio:  0.61        Systemic VTI:  0.17 m                            Systemic Diam: 1.70 cm Buford Dresser MD Electronically signed by Buford Dresser MD Signature Date/Time: 01/10/2022/9:18:01 PM    Final    CT HEAD CODE STROKE WO CONTRAST  Result Date: 01/10/2022 CLINICAL DATA:  Code stroke.  Left-sided weakness EXAM: CT HEAD WITHOUT CONTRAST TECHNIQUE: Contiguous axial images were obtained from the base of the skull through the vertex without intravenous contrast. RADIATION DOSE REDUCTION: This exam was  performed according to the departmental dose-optimization program which includes automated exposure control, adjustment of the mA and/or kV according to patient size and/or use of iterative reconstruction technique. COMPARISON:  CT head 03/10/2021 FINDINGS: Brain: There is an intraparenchymal hematoma centered in the right parasagittal frontal lobe measuring 3.0 cm AP x 2.3 cm TV x 3.0 cm CC (approximately 10.4 cc). There is mild regional mass effect with no midline shift. There is no evidence of acute infarct. Encephalomalacia in the right frontal lobe is unchanged. Background parenchymal volume is within normal limits. The ventricles are stable in size, with mild ex vacuo dilatation of the right lateral ventricle. Patchy hypodensity in the subcortical and periventricular white matter likely reflects sequela of chronic white matter microangiopathy. Vascular: There is calcification of the bilateral cavernous ICAs. Skull: Normal. Negative for fracture or focal lesion. Sinuses/Orbits: There is mucosal thickening in the right maxillary sinus with hyperostosis consistent with chronic sinusitis. The globes and orbits are unremarkable. Other: None. IMPRESSION: Intraparenchymal hematoma in the right parasagittal frontal lobe measuring 3.0 cm AP x 2.3 cm TV x 3.0 cm CC (approximately 10.4 cc) with mild surrounding edema but no midline shift. Critical Value/emergent results were called by telephone at the time of interpretation on 01/10/2022 at 11:57 am to provider Dr Erlinda Hong, who verbally acknowledged these results. Electronically Signed   By: Valetta Mole M.D.   On: 01/10/2022 11:54   VAS US CAROTID  Result Date: 01/12/2022 Carotid Arterial Duplex Study Patient Name:  Cynthia [name]  Date of Exam:   01/11/2022 Medical Rec #: [id number]          Accession #:    [id number] Date of Birth: Dec 30, 1945  Patient Gender: F Patient Age:   62 years Exam Location:  [facility] Procedure:      VAS US CAROTID Referring Phys:  Cornelius Moras XU --------------------------------------------------------------------------------  Indications:       CVA. Risk Factors:      Hypertension. Comparison Study:  No prior study Performing Technologist: Maudry Mayhew MHA, RDMS, RVT, RDCS  Examination Guidelines: A complete evaluation includes B-mode imaging, spectral Doppler, color Doppler, and power Doppler as needed of all accessible portions of each vessel. Bilateral testing is considered an integral part of a complete examination. Limited examinations for reoccurring indications may be performed as noted.  Right Carotid Findings: +----------+--------+--------+--------+------------------+--------+           PSV cm/sEDV cm/sStenosisPlaque DescriptionComments +----------+--------+--------+--------+------------------+--------+ CCA Prox  102     12                                         +----------+--------+--------+--------+------------------+--------+ CCA Distal65      13                                         +----------+--------+--------+--------+------------------+--------+ ICA Prox  51      12                                         +----------+--------+--------+--------+------------------+--------+ ICA Distal69      13                                         +----------+--------+--------+--------+------------------+--------+ ECA       72                                                 +----------+--------+--------+--------+------------------+--------+ +----------+--------+-------+----------------+-------------------+           PSV cm/sEDV cmsDescribe        Arm Pressure (mmHG) +----------+--------+-------+----------------+-------------------+ [id number]             Multiphasic, WNL                    +----------+--------+-------+----------------+-------------------+ +---------+--------+--+--------+-+---------+ VertebralPSV cm/s33EDV cm/s6Antegrade  +---------+--------+--+--------+-+---------+  Left Carotid Findings: +----------+--------+-------+--------+--------------------------------+--------+           PSV cm/sEDV    StenosisPlaque Description              Comments                   cm/s                                                    +----------+--------+-------+--------+--------------------------------+--------+ CCA Prox  87      13                                                      +----------+--------+-------+--------+--------------------------------+--------+  CCA Distal80      11                                                      +----------+--------+-------+--------+--------------------------------+--------+ ICA Prox  47      15             heterogenous, irregular and                                               focal                                    +----------+--------+-------+--------+--------------------------------+--------+ ICA Distal90      21                                                      +----------+--------+-------+--------+--------------------------------+--------+ ECA       81      4              heterogenous and irregular               +----------+--------+-------+--------+--------------------------------+--------+ +----------+--------+--------+----------------+-------------------+           PSV cm/sEDV cm/sDescribe        Arm Pressure (mmHG) +----------+--------+--------+----------------+-------------------+ [id number]             Multiphasic, WNL                    +----------+--------+--------+----------------+-------------------+ +---------+--------+--+--------+-+---------+ VertebralPSV cm/s46EDV cm/s8Antegrade +---------+--------+--+--------+-+---------+   Summary: Right Carotid: Velocities in the right ICA are consistent with a 1-39% stenosis. Left Carotid: Velocities in the left ICA are consistent with a 1-39% stenosis. Vertebrals:   Bilateral vertebral arteries demonstrate antegrade flow. Subclavians: Normal flow hemodynamics were seen in bilateral subclavian              arteries. *See table(s) above for measurements and observations.  Electronically signed by Antony Contras MD on 01/12/2022 at 8:16:29 AM.    Final    CT Maxillofacial Wo Contrast  Result Date: 01/10/2022 CLINICAL DATA:  Neck trauma, uncomplicated (NEXUS/CCR neg) (Age 82-64y); Facial trauma, blunt EXAM: CT MAXILLOFACIAL WITHOUT CONTRAST CT CERVICAL SPINE WITHOUT CONTRAST TECHNIQUE: Multidetector CT imaging of the maxillofacial structures was performed. Multiplanar CT image reconstructions were also generated. A small metallic BB was placed on the right temple in order to reliably differentiate right from left. Multidetector CT imaging of the cervical spine was performed without intravenous contrast. Multiplanar CT image reconstructions were also generated. RADIATION DOSE REDUCTION: This exam was performed according to the departmental dose-optimization program which includes automated exposure control, adjustment of the mA and/or kV according to patient size and/or use of iterative reconstruction technique. COMPARISON:  Same day head CT, CT head and neck 03/10/2021 FINDINGS: CT MAXILLOFACIAL FINDINGS Osseous: No fracture or mandibular dislocation. No destructive process. Orbits: Negative. No traumatic or inflammatory finding. Sinuses: Small amount of frothy material in the right maxillary sinuses. The other paranasal sinuses are predominantly clear. Soft tissues: Mild  left periorbital soft tissue swelling. Partially visualized intraparenchymal edema associated with an intraparenchymal hematoma as seen on separately dictated head CT for intracranial findings. CT CERVICAL SPINE FINDINGS Alignment: Trace degenerative anterolisthesis at C3-C4, retrolisthesis at C4-C5, and grade 1 anterolisthesis at C6-C7. Skull base and vertebrae: No acute cervical spine fracture no acute osseous  lesion. Soft tissues and spinal canal: No prevertebral fluid or swelling. No visible canal hematoma. Disc levels: There is multilevel degenerative disc disease, mild at C3-C4, moderate at C4-C5, and severe at C5-C6. Multilevel facet arthropathy of varying degrees of severity. Upper chest: Unchanged 2.1 cm hypodense right thyroid nodule. Other: None IMPRESSION: No acute facial fracture. Mild left periorbital soft tissue swelling. No acute cervical spine fracture. Please see separately dictated head CT for intracranial findings. Electronically Signed   By: Maurine Simmering M.D.   On: 01/10/2022 12:26    Labs:  Basic Metabolic Panel: Recent Labs  Lab 02/08/22 0618  NA 134*  K 4.4  CL 98  CO2 31  GLUCOSE 90  BUN 9  CREATININE 0.76  CALCIUM 9.2    CBC: Recent Labs  Lab 02/08/22 0618  WBC 5.3  HGB 11.9*  HCT 35.4*  MCV 91.0  PLT 262    CBG: No results for input(s): GLUCAP in the last 168 hours.  Family history.  Mother with CAD as well as lung cancer paternal grandmother with CVA maternal grandfather with stomach cancer.  Denies any esophageal cancer or rectal cancer or diabetes mellitus  Brief HPI:   Cynthia [name] is a 77 y.o. right-handed female with history of hypertension, right frontal ICH 2017 breast cancer with lumpectomy with radioactive seed localization 08/28/2020.  Per chart review lives with her children.  Bed and bath main level of home with 4 steps to entry.  Son works third shift at home during the day.  She is retired Pharmacist, hospital reportedly independent prior to admission.  Presented 01/10/2022 with acute onset of left-sided weakness and slid out of bed.  She attempted to stand hitting the back of her head without loss of consciousness.  Cranial CT scan showed intraparenchymal hematoma in the right parasagittal frontal lobe measuring 3.0 cm AP by 2.3 cm TV by 3.0 cm with mild surrounding edema but no midline shift.  CT cervical spine and maxillofacial negative.  Initial EEG  negative.  Overnight EEG with video showing seizure arising from right frontal temporal region.  Admission chemistries unremarkable except glucose 108 WBC 12,700.  Echocardiogram with ejection fraction of 60 to 65% no wall motion abnormalities.  Patient was loaded with Keppra for seizure prophylaxis.  Follow-up MRI of the brain as well as angio head and neck showed acute/subacute hematoma right peri colossal frontal lobe unchanged.  MRI negative.  Tolerating a regular diet.  Therapy evaluations completed due to patient decreased functional mobility was admitted for comprehensive rehab program.   Hospital Course: Necia Kamm was admitted to rehab 01/19/2022 for inpatient therapies to consist of PT, ST and OT at least three hours five days a week. Past admission physiatrist, therapy team and rehab RN have worked together to provide customized collaborative inpatient rehab.  Pertaining to patient's intraparenchymal hematoma right frontal lobe as well as history of right frontal ICH 2017 remained stable patient attending full therapies.  Mechanical antiembolism stockings for DVT prophylaxis no Lovenox due to IPH.  She did have some spasticity and increased tone left lower extremity with Zanaflex initiated.  Mood stabilization with the use of Effexor and emotional support provided.  Seizure prophylaxis EEG negative Keppra as indicated.  Blood pressure controlled on Cozaar monitor with increased mobility patient will need outpatient follow-up.  History of left breast cancer radioactive seed localization 08/28/2020 follow-up outpatient.  Decreased nutritional storage dietary follow-up.  E. coli UTI with Bactrim initiated and completed.  No dysuria or hematuria have some frequency with fall urinalysis pending   Blood pressures were monitored on TID basis and soft and monitored     Rehab course: During patient's stay in rehab weekly team conferences were held to monitor patient's progress, set goals and  discuss barriers to discharge. At admission, patient required max assist stand pivot transfers max assist step pivot transfers max assist 1 foot ambulation with assistance  Physical exam.  Blood pressure 146/71 pulse 99 temperature 97.8 respirations 18 oxygen saturation 90% room air Constitutional.  No acute distress HEENT Head.  Normocephalic and atraumatic Eyes.  Pupils round and reactive to light no discharge.nystagmus Neck.  Supple nontender no JVD without thyromegaly Cardiac regular rate rhythm without extra sounds or murmur heard Abdomen.  Soft nontender positive bowel sounds without rebound Respiratory effort normal no respiratory distress without wheeze Musculoskeletal Comments Right upper extremity 5/5 Right lower extremity 5/5 Left upper extremity 4/5-second time tested except FA 3+/5 Left lower extremity 4 -/5 proximal and 4+/5 distally Neurologic.  Alert no acute distress makes eye contact with examiner.  She provides her name with some recall as well as poor medical historian.    He/She  has had improvement in activity tolerance, balance, postural control as well as ability to compensate for deficits. He/She has had improvement in functional use RUE/LUE  and RLE/LLE as well as improvement in awareness.  Patient performed supine to sit supervision in a flat bed without use of rails.  Provided verbal cues for attention to left hemibody placement.  Perform sit to stand from the bed x1 toilet x1 in standard armchair x1.  Ambulates 100 feet x 2 rolling walker with DF assist wrap with inversion bias with minimal assist for rolling walker management.  Patient stood at sink without upper extremity support with contact-guard close supervision.  Patient able to ambulate to the bathroom rolling walker with moderate assist.  Needing assist to manage rolling walker in the bathroom.  Completed 3/3 toileting task with moderate assist needing most assist for clothing management.  Speech therapy  follow-up for implementation of compensatory left visual attention strategies to include lighthouse scanning and visual anchor.  All issues discussed for safety discussed with family and plan of discharge to skilled nursing facility       Disposition: Discharge to skilled nursing facility    Diet: Regular  Special Instructions: No driving smoking or alcohol  Medications at discharge 1.  Tylenol as needed 2.  Xanax 0.5 mg 3 times daily as needed anxiety 3.  Pepcid 20 mg p.o. twice daily 4.  Ferrous sulfate 325 mg p.o. daily 5.  Keppra 1000 mg p.o. twice daily 6.  Cozaar 100 mg p.o. daily 7.  Zanaflex 2 mg p.o. twice daily 8.  Effexor 75 mg p.o. daily 9.  Vitamin B12 1000 mcg p.o. daily 10.Zanaflex 2 mg po every morning and '4mg'$  bedtime  30-35 minutes were spent completing discharge summary and discharge planning  Discharge Instructions     Ambulatory referral to Neurology   Complete by: As directed    An appointment is requested in approximately: 4 weeks intraparenchymal hemorrhage   Ambulatory referral to Physical Medicine Rehab   Complete by: As  directed    Follow-up 1 month patient for skilled nursing facility placement for IPH        Contact information for follow-up providers     Jennye Boroughs, MD Follow up.   Specialty: Physical Medicine and Rehabilitation Why: Office to call for appointment Contact information: [address] [place] [address] [phone]              Contact information for after-discharge care     Destination     [facility] .   Service: Skilled Nursing Contact information: [address] [place] [phone]                     Signed: Lavon Paganini Stella 02/09/2022, 5:11 AM

## 2022-02-04 NOTE — Progress Notes (Signed)
Physical Therapy Session Note  Patient Details  Name: Cynthia [name] MRN: [id number] Date of Birth: 12/29/1945  Today's Date: 02/04/2022 PT Individual Time: 1105-1200   66mn  Short Term Goals: Week 1:  PT Short Term Goal 1 (Week 1): Patient will complete supine <> sit with MinA consistently PT Short Term Goal 1 - Progress (Week 1): Progressing toward goal PT Short Term Goal 2 (Week 1): Patient will complete sit <> stand with LRAD and ModA consistently PT Short Term Goal 2 - Progress (Week 1): Met PT Short Term Goal 3 (Week 1): Patient will ambulate at least 15fwith LRAD and ModA + wc follow as needed PT Short Term Goal 3 - Progress (Week 1): Met PT Short Term Goal 4 (Week 1): Patient will initiate stair training PT Short Term Goal 4 - Progress (Week 1): Met Week 2:  PT Short Term Goal 1 (Week 2): Patient will complete supine <> sit with MinA consistently and LRAD PT Short Term Goal 1 - Progress (Week 2): Met PT Short Term Goal 2 (Week 2): Patient will compelte bed <> wc transfer with ModA consistently PT Short Term Goal 2 - Progress (Week 2): Met PT Short Term Goal 3 (Week 2): Patient will propel wc >1539fith no more than ModA PT Short Term Goal 3 - Progress (Week 2): Met Week 3:  PT Short Term Goal 1 (Week 3): STG=LTG due to ELOS.  Skilled Therapeutic Interventions/Progress Updates:   Pt received sitting in WC and agreeable to PT  Sit<>stand transfers in parallel bars with with RW with CGA-supervision assist with cues for LLE position.   Standing heel cord/gastroc stretch in parallel bars 3 x 30sec bil with moderate cues for posture, and improved awareness of LLE to improve quality of stretch .   Dynamic gait training in parallel bars forward/reverse 4 x [id number] stepping R and L x 2 bil with use of mirror for visual feedback to improve posture and step width on the LLE.    Gait training with RW 2 x 100ft52fh cues for step width on the LLE and improved AD management in  turns to keep BLE within RW. CGA-min assist for safety when distracted.   Dynamic balance training to perform cognitive task of BITS sequencing A-Z x 1 with each UE and then seated to perform number/letter sequence to 10/J min cues for A-Z and max cues for number letter.   [facility] returned to [facility] and [facility] and all needs met.           Therapy Documentation Precautions:  Precautions Precautions: Fall Precaution Comments: L LE extensor tone Restrictions Weight Bearing Restrictions: No Other Position/Activity Restrictions: tendency to stand on toes on L foot due to extension tone/L ankle contracture    Pain: Pain Assessment Pain Scale: 0-10 Pain Score: 0-No pain   Therapy/Group: Individual Therapy  AustiLorie Phenix/2023, 11:48 AM

## 2022-02-04 NOTE — Progress Notes (Signed)
PROGRESS NOTE   Subjective/Complaints:  No new complaints or concerns. Reports she had BM yesterday.  Review of Systems  Respiratory:  Negative for shortness of breath.   Cardiovascular:  Negative for chest pain.  Gastrointestinal:  Negative for constipation, diarrhea, nausea and vomiting.  Musculoskeletal:  Negative for falls.  Neurological:  Positive for focal weakness. Negative for dizziness.  Psychiatric/Behavioral:  Negative for depression.     Objective:   No results found. No results for input(s): WBC, HGB, HCT, PLT in the last 72 hours.  No results for input(s): NA, K, CL, CO2, GLUCOSE, BUN, CREATININE, CALCIUM in the last 72 hours.   Intake/Output Summary (Last 24 hours) at 02/04/2022 0735 Last data filed at 02/03/2022 2120 Gross per 24 hour  Intake 960 ml  Output --  Net 960 ml         Physical Exam: Vital Signs Blood pressure 108/68, pulse 65, temperature 97.8 F (36.6 C), temperature source Oral, resp. rate 16, height '5\' 6"'$  (1.676 m), weight 61.4 kg, SpO2 97 %.    Assessment/Plan: 1. Functional deficits which require 3+ hours per day of interdisciplinary therapy in a comprehensive inpatient rehab setting. Physiatrist is providing close team supervision and 24 hour management of active medical problems listed below. Physiatrist and rehab team continue to assess barriers to discharge/monitor patient progress toward functional and medical goals  Care Tool:  Bathing    Body parts bathed by patient: Right arm, Left arm, Chest, Abdomen, Front perineal area, Right upper leg, Left upper leg, Face   Body parts bathed by helper: Right lower leg, Left lower leg, Buttocks     Bathing assist Assist Level: Moderate Assistance - Patient 50 - 74%     Upper Body Dressing/Undressing Upper body dressing   What is the patient wearing?: Pull over shirt    Upper body assist Assist Level: Moderate Assistance -  Patient 50 - 74%    Lower Body Dressing/Undressing Lower body dressing      What is the patient wearing?: Pants, Incontinence brief     Lower body assist Assist for lower body dressing: Maximal Assistance - Patient 25 - 49%     Toileting Toileting    Toileting assist Assist for toileting: Moderate Assistance - Patient 50 - 74%     Transfers Chair/bed transfer  Transfers assist     Chair/bed transfer assist level: Minimal Assistance - Patient > 75%     Locomotion Ambulation   Ambulation assist      Assist level: 2 helpers Assistive device: Hand held assist Max distance: 200 ft   Walk 10 feet activity   Assist  Walk 10 feet activity did not occur: Safety/medical concerns  Assist level: 2 helpers Assistive device: Hand held assist   Walk 50 feet activity   Assist Walk 50 feet with 2 turns activity did not occur: Safety/medical concerns  Assist level: 2 helpers Assistive device: Hand held assist    Walk 150 feet activity   Assist Walk 150 feet activity did not occur: Safety/medical concerns  Assist level: 2 helpers Assistive device: Hand held assist    Walk 10 feet on uneven surface  activity   Assist  Walk 10 feet on uneven surfaces activity did not occur: Safety/medical concerns         Wheelchair     Assist Is the patient using a wheelchair?: Yes Type of Wheelchair: Manual    Wheelchair assist level: Total Assistance - Patient < 25% Max wheelchair distance: 150    Wheelchair 50 feet with 2 turns activity    Assist        Assist Level: Total Assistance - Patient < 25%   Wheelchair 150 feet activity     Assist      Assist Level: Total Assistance - Patient < 25%   Blood pressure 108/68, pulse 65, temperature 97.8 F (36.6 C), temperature source Oral, resp. rate 16, height '5\' 6"'$  (1.676 m), weight 61.4 kg, SpO2 97 %.    General: Alert, No apparent distress. In wheelchair HEENT: Head is normocephalic,   PERRLA, EOMI, oral mucosa pink and moist Neck: Supple Heart: Reg rate and rhythm. No murmurs or extra heart sounds. Chest: CTA bilaterally Abdomen: Soft, non-tender, non-distended, bowel sounds positive. Extremities: No clubbing, cyanosis, or edema. Psych: normal affect. Pleasant. Some impulsivity at times. Skin: warm and dry Neuro:  Alert and oriented x3.  Still has neglect of left side-improving. CN II-X12 grossly intact.  Sensation intact x4. Follows commands Musculoskeletal:  Increased tone in LUE elbow flexors and LLE hip adductors and hip and knee extensors, increased tone in ankle plantarflexors on Left - improved  Medical Problem List and Plan: 1. Functional deficits secondary to intraparenchymal hematoma in the right rontal lobe             -patient may  shower             -ELOS/Goals: 12-14 days min A CGA, expected dc 5/31,   -SNF option is being investigated             -Continue CIR for PT, OT and SLP  -CT head after fall 5/15 without acute changes, discussed fall prevention -increased tone in RLE especially plantarflexion of ankle interfering with gait, this is improving with medication and stretching 2.  Antithrombotics: -DVT/anticoagulation:  Mechanical: Antiembolism stockings, thigh (TED hose) Bilateral lower extremities             No Lovenoxetc due to IPH             -antiplatelet therapy: N/A 3. Pain Management: 4. Mood: Effexor 75 mg daily             -antipsychotic agents: N/A  -5/24 reports she continues to have good mood overall, continue Effexor 5. Neuropsych: This patient is capable of making decisions on her own behalf. 6. Skin/Wound Care: Routine skin check 7. Fluids/Electrolytes/Nutrition: Routine in and out with follow-up chemistries  -5/11 midline removed yesterday 8.  Seizure disorder- new onset since IPH-.  Keppra 1000 mg every 12 hours  -5/26 No additional seizure activity since at CIR 9.  Hypertension.  Cozaar 100 mg daily.  Monitor with increased  mobility           -5/25 BP well controlled, continue cozaar '100mg'$  dialy    02/04/2022    4:42 AM 02/03/2022    8:39 PM 02/03/2022    1:40 PM  Vitals with BMI  Systolic [phone]  Diastolic 68 56 59  Pulse 65 71 95    10.  History of left breast cancer with radioactive seed localization 08/28/2020.  Follow-up outpatient 11. Hx of R frontal ICH 2017 12. Poor appetite- might need  supplements or appetite stimulants  -5/10 albumin a little low, encourage PO intake             -5/25 She is eating most of her meals, follow 13. Diarrhea , improved  5/23 Normal BM this AM, follow 14. UTI     -UA on 5/18: + leuks, + nitrite, many bacteria, culture pending.      -Pt started on Bactrim DS on 01/28/22.       -5/22 no dysuria this AM, culture shows ecoli that was sensitive to bactrim, continue to monitor  -5/23 WBC wnl 15. Spasticity, increased tone in LLE particularly with ankle plantarflexors  -5/23 will order dorsiflexion night splint  -5/23 Tizanidine QHS started  -5/25 Increase Tizanidine to '2mg'$  BID, tone has improved with medication  -5/26 Continue tizanidine current dose 16. Constipation  5/26 Pt reports she had BM yesterday, do not see this documented, Miralax PRN   LOS: 16 days A FACE TO [place] 02/04/2022, 7:35 AM

## 2022-02-04 NOTE — Plan of Care (Signed)
  Problem: RH Simple Meal Prep Goal: LTG Patient will perform simple meal prep w/assist (OT) Description: LTG: Patient will perform simple meal prep with assistance, with/without cues (OT). Flowsheets (Taken 02/04/2022 0958) LTG: Pt will perform simple meal prep with assistance level of: (LTG discontinued as she will not be safe to do meal prep at this time with her left neglect, perceptual deficits and decreased problem solving skills.) -- Note: LTG discontinued as she will not be safe to do meal prep at this time with her left neglect, perceptual deficits and decreased problem solving skills.

## 2022-02-04 NOTE — Progress Notes (Signed)
Speech Language Pathology Daily Session Note  Patient Details  Name: Vallie Fayette MRN: [id number] Date of Birth: 1945-10-27  Today's Date: 02/04/2022 SLP Individual Time: 1001-1100 SLP Individual Time Calculation (min): 59 min  Short Term Goals: Week 3: SLP Short Term Goal 1 (Week 3): STG's = LTG's due to ELOS  Skilled Therapeutic Interventions: Pt seen for skilled ST with focus on cognitive goals, pt upright in wheelchair upon entry and writing in her memory notebook about previous OT session. Pt continues to demonstrate decreased awareness of her deficits, insisting she will be OK home without much assistance despite staff education and recommendations. SLP facilitating simple money counting/management activity (same task presented day 2 or 3 of rehab stay) by providing MAX A multimodal cues to assist with organization and problem solving. Pt requiring >25 minutes to complete 3 simple money counting problems, reporting "I can't believe this is so hard for me". Pt did demonstrate improved initiation and attempts at organizing thoughts during task today vs earlier in rehab stay, but attention and poor executive function limit accuracy.  SLP facilitating simple medication organization task by providing overall max A cues for 40% accuracy. Pt with limited awareness of errors, would make significant mistakes when reading medication labels (I.e. take 1 pill 4 times a day, pt put 4 pills in one slot). Pt completing simple calendar/time management task with 50% accuracy and more than a reasonable amount of time. Recommend 24/7 supervision for safety at discharge and assistance/supervision with all higher level cognitive tasks. Pt left in wheelchair with alarm belt intact and all needs within reach. Cont ST POC.  Pain Pain Assessment Pain Scale: 0-10 Pain Score: 0-No pain  Therapy/Group: Individual Therapy  [name] 02/04/2022, 12:23 PM

## 2022-02-04 NOTE — NC FL2 (Signed)
[place] LEVEL OF CARE SCREENING TOOL     IDENTIFICATION  Patient Name: Cynthia [name] Birthdate: October 06, 1945 Sex: female Admission Date (Current Location): 01/19/2022  [facility] and [place] Number:  Herbalist and Address:  [facility] [place], [place] [address], [place], [place] [address]      Provider Number: [id number]  Attending Physician Name and Address:  Jennye Boroughs, MD  Relative Name and Phone Number:  Marita Snellen [phone]    Current Level of Care: Other (Comment) (Rehab) Recommended Level of Care: [place] Prior Approval Number:    Date Approved/Denied:   PASRR Number: [id number]  Discharge Plan: SNF    Current Diagnoses: Patient Active Problem List   Diagnosis Date Noted   Poor appetite    Mood disorder in conditions classified elsewhere    Seizure ([place]) 01/14/2022   Hyperlipidemia 01/14/2022   Hypokalemia 01/14/2022   Ductal carcinoma in situ (DCIS) of left breast 07/23/2020   Partial symptomatic epilepsy with complex partial seizures, not intractable, without status epilepticus ([place]) 06/08/2017   Numbness and tingling 10/03/2016   Cognitive deficit due to recent stroke    Left hemiparesis ([place]) 03/29/2016   Left-sided visual neglect 03/29/2016   Vascular headache    Benign essential HTN    Hyponatremia    Acute blood loss anemia    TBI (traumatic brain injury) ([place])    Intracerebral hemorrhage 03/20/2016   ICH (intracerebral hemorrhage) ([place]) 03/20/2016   Colon polyps    Osteoporosis    HTN (hypertension) 01/08/2013    Orientation RESPIRATION BLADDER Height & Weight     Self, Place, Situation  Normal Continent Weight: 135 lb 5.8 oz (61.4 kg) Height:  '5\' 6"'$  (167.6 cm)  BEHAVIORAL SYMPTOMS/MOOD NEUROLOGICAL BOWEL NUTRITION STATUS      Continent Diet (regular thin liquids)  AMBULATORY STATUS COMMUNICATION OF NEEDS Skin   Limited Assist Verbally Normal                        Personal Care Assistance Level of Assistance  Bathing, Dressing Bathing Assistance: Limited assistance   Dressing Assistance: Limited assistance     Functional Limitations Info  Sight, Speech Sight Info: Adequate   Speech Info: Impaired    SPECIAL CARE FACTORS FREQUENCY  PT (By licensed PT), OT (By licensed OT), Speech therapy     PT Frequency: 5x week OT Frequency: 5x week     Speech Therapy Frequency: 5x week      Contractures Contractures Info: Not present    Additional Factors Info  Code Status, Allergies Code Status Info: Full Code Allergies Info: Fire Ants, Penicillins, Codeine, Acyclovir           Current Medications (02/04/2022):  This is the current hospital active medication list Current Facility-Administered Medications  Medication Dose Route Frequency Provider Last Rate Last Admin   acetaminophen (TYLENOL) tablet 650 mg  650 mg Oral Q6H PRN Barbie Banner, PA-C   650 mg at 01/24/22 2034   ALPRAZolam Duanne Moron) tablet 0.5 mg  0.5 mg Oral TID PRN Cathlyn Parsons, PA-C   0.5 mg at 01/20/22 9432   famotidine (PEPCID) tablet 20 mg  20 mg Oral BID Cathlyn Parsons, PA-C   20 mg at 02/04/22 0750   ferrous sulfate tablet 325 mg  325 mg Oral Q0600 Cathlyn Parsons, PA-C   325 mg at 02/04/22 0558   levETIRAcetam (KEPPRA) tablet 1,000 mg  1,000 mg Oral BID  Cathlyn Parsons, PA-C   1,000 mg at 02/04/22 0750   losartan (COZAAR) tablet 100 mg  100 mg Oral Daily Cathlyn Parsons, PA-C   100 mg at 02/04/22 0751   tiZANidine (ZANAFLEX) tablet 2 mg  2 mg Oral BID Jennye Boroughs, MD   2 mg at 02/04/22 0750   venlafaxine (EFFEXOR) tablet 75 mg  75 mg Oral QPC supper Cathlyn Parsons, PA-C   75 mg at 02/03/22 0459   vitamin B-12 (CYANOCOBALAMIN) tablet 1,000 mcg  1,000 mcg Oral Daily Cathlyn Parsons, PA-C   1,000 mcg at 02/04/22 9774     Discharge Medications: Please see discharge summary for a list of discharge medications.  Relevant Imaging  Results:  Relevant Lab Results:   Additional Information SSN: [id number]  Deiondre Harrower, Gardiner Rhyme, LCSW

## 2022-02-04 NOTE — Progress Notes (Signed)
Occupational Therapy Weekly Progress Note  Patient Details  Name: Cynthia [name] MRN: [id number] Date of Birth: [birth year]  Beginning of progress report period: Jan 26, 2022 End of progress report period: Feb 04, 2022  Today's Date: 02/04/2022 OT Individual Time: 0272-5366 OT Individual Time Calculation (min): 75 min    Patient has met 4 of 4 short term goals.  Pt has made good strides with her ability to stand, engage her balance and processes with self care skills but she continues to struggle with L side awareness causing her to need moderate verbal cues and min A to CGA.  Explained to pt that in order to reach a point that she is safe to be home alone for hours at a time, she needs to increase her L side awareness to the point she no longer needs any cues or guarding assist.   Patient continues to demonstrate the following deficits: abnormal tone, unbalanced muscle activation, and decreased motor planning, decreased attention to left, decreased awareness, decreased problem solving, decreased memory, and delayed processing, and decreased sitting balance, decreased standing balance, decreased postural control, hemiplegia, and decreased balance strategies and therefore will continue to benefit from skilled OT intervention to enhance overall performance with BADL.  Patient progressing toward long term goals..  Continue plan of care. The simple meal prep goal was discontinued as pt will not be safe with kitchen management at this time.   OT Short Term Goals Week 1:  OT Short Term Goal 1 (Week 1): Pt will complete toilet transfer with max A of 1 to BSC to promote OOB toileting OT Short Term Goal 1 - Progress (Week 1): Met OT Short Term Goal 2 (Week 1): Pt will complete LB dressing mod A at the sit > stand level with AE PRN OT Short Term Goal 2 - Progress (Week 1): Met OT Short Term Goal 3 (Week 1): Pt will maintain dynamic sitting balance with supervision during ADL task OT Short Term Goal 3  - Progress (Week 1): Progressing toward goal Week 2:  OT Short Term Goal 1 (Week 2): Pt will be able to transfer to toilet with mod A. OT Short Term Goal 1 - Progress (Week 2): Met OT Short Term Goal 2 (Week 2): Pt will be able to stand at toilet with mod A during clothing management. OT Short Term Goal 2 - Progress (Week 2): Met OT Short Term Goal 3 (Week 2): Pt will demonstrate improve dual tasking during shower to maintain her balance while washing UB. OT Short Term Goal 3 - Progress (Week 2): Met OT Short Term Goal 4 (Week 2): Pt will demonstrate improved L side awareness to don shirt with only 3 cues or less. OT Short Term Goal 4 - Progress (Week 2): Met Week 3:  OT Short Term Goal 1 (Week 3): Pt will demonstrate improved L side awareness and problem solving to don a bra with set up. OT Short Term Goal 2 (Week 3): Pt will demonstrate improved L side awareness and problem solving to transfer out of shower or to the toilet going to her LEFT side with CGA and only min cues. OT Short Term Goal 3 (Week 3): Pt will demonstrate improved memory and awareness to recall where to place R foot and hands during sit to stands with 1 cue or less.  Skilled Therapeutic Interventions/Progress Updates:    Pt received in wc and agreeable to a shower. See ADL documentation below.  Overall, she is making excellent progress BUT continues  to need MOD cues in regards to problem solving tasks affected by her L inattention, decreased motor planning and problem solving.   For example, transferring into shower towards her R side she only needed guarding A and NO cues.  Coming out of the shower to her L she had great difficulty figuring out how to turn her body to step out.  With dressing she needs cues to place L side of body in clothing first or she gets "stuck".  Motor impersistence with L hand holding onto bars, or walker handle with transition movements and needs cues to attend to hand.  Discussed in detail with patient  what she needs to continue to improve in, but she is making progress and weekly is able to do more things with less assistance.  Pt resting in wc with belt alarm and telesitter on.  All needs met.   Therapy Documentation Precautions:  Precautions Precautions: Fall Precaution Comments: L LE extensor tone Restrictions Weight Bearing Restrictions: No Other Position/Activity Restrictions: tendency to stand on toes on L foot due to extension tone/L ankle contracture     Pain: Pain Assessment Pain Scale: 0-10 Pain Score: 0-No pain ADL: ADL Eating: Set up Where Assessed-Eating: Wheelchair Grooming: Setup Where Assessed-Grooming: Wheelchair Upper Body Bathing: Supervision/safety Where Assessed-Upper Body Bathing: Shower Lower Body Bathing: Contact guard Where Assessed-Lower Body Bathing: Shower Upper Body Dressing: Minimal assistance Where Assessed-Upper Body Dressing: Wheelchair Lower Body Dressing: Contact guard Where Assessed-Lower Body Dressing: Wheelchair Toileting: Moderate assistance Where Assessed-Toileting: Glass blower/designer: Psychiatric nurse Method: Ambulating, Stand pivot Science writer: Raised toilet seat Tub/Shower Transfer: Unable to assess Tub/Shower Transfer Method: Unable to assess Social research officer, government: Minimal assistance Social research officer, government Method: Ambulating, Stand pivot [employer] Equipment: Transfer tub bench   Therapy/Group: Individual Therapy  [place] 02/04/2022, 10:10 AM

## 2022-02-05 NOTE — Progress Notes (Signed)
Physical Therapy Session Note  Patient Details  Name: Cynthia [name] MRN: [id number] Date of Birth: 05/23/1946  Today's Date: 02/05/2022 PT Individual Time: 1016-1058 PT Individual Time Calculation (min): 42 min   Short Term Goals: Week 2:  PT Short Term Goal 1 (Week 2): Patient will complete supine <> sit with MinA consistently and LRAD PT Short Term Goal 1 - Progress (Week 2): Met PT Short Term Goal 2 (Week 2): Patient will compelte bed <> wc transfer with ModA consistently PT Short Term Goal 2 - Progress (Week 2): Met PT Short Term Goal 3 (Week 2): Patient will propel wc >52f with no more than ModA PT Short Term Goal 3 - Progress (Week 2): Met  Skilled Therapeutic Interventions/Progress Updates:  Chart reviewed and pt agreeable to therapy. Pt received semi-reclined in bed with no c/o pain. Also of note, pt was found with wet brief. Session focused on functional transfers and dressing management to facilitate mobility and balance for self-care. Pt initiated session with transfer to EOB using CGA. Pt then completed sit to stand using ModA + RW to assist balance. Pt noted to have inability to place L heel on ground. [facility] using ModA + RW for balance. Pt noted to have posterior lean 2/2 L DF limitation. Pt then donned shirt with set up assistance. Brief was changed and pants donned with maxA + RW. Pt required active rest t/o tasks. Pt then practice 3x sit to stand at bed side with ModA + hand rails. In standing, pt completed partial weight shift to L side to facilitate DF. At end of session, [facility], nurse call bell and all needs in reach.     Therapy Documentation Precautions:  Precautions Precautions: Fall Precaution Comments: L LE extensor tone Restrictions Weight Bearing Restrictions: No Other Position/Activity Restrictions: tendency to stand on toes on L foot due to extension tone/L ankle  contracture General:   Vital Signs: Therapy Vitals Temp: 98.4 F (36.9 C) Pulse Rate: 93 Resp: 16 BP: (!) 109/54 Patient Position (if appropriate): Sitting Oxygen Therapy SpO2: 98 % O2 Device: Room Air     Therapy/Group: Individual Therapy  KMarquette Old PT, DPT 02/05/2022, 3:33 PM

## 2022-02-05 NOTE — Progress Notes (Signed)
PROGRESS NOTE   Subjective/Complaints:  No new complaints or concerns. Had BM two days ago, feeling fine.  Review of Systems  Constitutional: Negative.   Respiratory:  Negative for shortness of breath.   Cardiovascular:  Negative for chest pain and palpitations.  Gastrointestinal:  Negative for constipation, diarrhea, nausea and vomiting.  Musculoskeletal:  Negative for falls.  Skin: Negative.   Neurological:  Positive for focal weakness. Negative for dizziness and headaches.  Psychiatric/Behavioral:  Negative for depression.     Objective:   No results found. No results for input(s): WBC, HGB, HCT, PLT in the last 72 hours.  No results for input(s): NA, K, CL, CO2, GLUCOSE, BUN, CREATININE, CALCIUM in the last 72 hours.   Intake/Output Summary (Last 24 hours) at 02/05/2022 1758 Last data filed at 02/05/2022 5625 Gross per 24 hour  Intake 360 ml  Output --  Net 360 ml         Physical Exam: Vital Signs Blood pressure (!) 109/54, pulse 93, temperature 98.4 F (36.9 C), resp. rate 16, height '5\' 6"'$  (1.676 m), weight 61.4 kg, SpO2 98 %.    Assessment/Plan: 1. Functional deficits which require 3+ hours per day of interdisciplinary therapy in a comprehensive inpatient rehab setting. Physiatrist is providing close team supervision and 24 hour management of active medical problems listed below. Physiatrist and rehab team continue to assess barriers to discharge/monitor patient progress toward functional and medical goals  Care Tool:  Bathing    Body parts bathed by patient: Right arm, Left arm, Chest, Abdomen, Front perineal area, Right upper leg, Left upper leg, Face, Left lower leg, Right lower leg, Buttocks   Body parts bathed by helper: Right lower leg, Left lower leg, Buttocks     Bathing assist Assist Level: Contact Guard/Touching assist     Upper Body Dressing/Undressing Upper body dressing   What is  the patient wearing?: Bra, Pull over shirt    Upper body assist Assist Level: Minimal Assistance - Patient > 75% (set up shirt, min bra)    Lower Body Dressing/Undressing Lower body dressing      What is the patient wearing?: Underwear/pull up, Pants     Lower body assist Assist for lower body dressing: Contact Guard/Touching assist     Toileting Toileting    Toileting assist Assist for toileting: Moderate Assistance - Patient 50 - 74%     Transfers Chair/bed transfer  Transfers assist     Chair/bed transfer assist level: Minimal Assistance - Patient > 75%     Locomotion Ambulation   Ambulation assist      Assist level: 2 helpers Assistive device: Hand held assist Max distance: 200 ft   Walk 10 feet activity   Assist  Walk 10 feet activity did not occur: Safety/medical concerns  Assist level: 2 helpers Assistive device: Hand held assist   Walk 50 feet activity   Assist Walk 50 feet with 2 turns activity did not occur: Safety/medical concerns  Assist level: 2 helpers Assistive device: Hand held assist    Walk 150 feet activity   Assist Walk 150 feet activity did not occur: Safety/medical concerns  Assist level: 2 helpers Assistive device: Hand  held assist    Walk 10 feet on uneven surface  activity   Assist Walk 10 feet on uneven surfaces activity did not occur: Safety/medical concerns         Wheelchair     Assist Is the patient using a wheelchair?: Yes Type of Wheelchair: Manual    Wheelchair assist level: Total Assistance - Patient < 25% Max wheelchair distance: 150    Wheelchair 50 feet with 2 turns activity    Assist        Assist Level: Total Assistance - Patient < 25%   Wheelchair 150 feet activity     Assist      Assist Level: Total Assistance - Patient < 25%   Blood pressure (!) 109/54, pulse 93, temperature 98.4 F (36.9 C), resp. rate 16, height '5\' 6"'$  (1.676 m), weight 61.4 kg, SpO2 98  %.    General: Alert, No apparent distress. In wheelchair HEENT: Head is normocephalic, EOMI, PERRLA, oral mucosa pink and moist Neck: Supple Heart: Reg rate and rhythm. No murmurs or extra heart sounds. Chest: CTA bilaterally Abdomen: Soft, non-tender, non-distended, bowel sounds positive. Extremities: No clubbing, cyanosis, or edema. Psych: normal affect. Pleasant. Some impulsivity at times. Skin: warm and dry Neuro:  Alert and oriented x3.  Still has neglect of left side-improving. CN II-X12 grossly intact.  Sensation intact x4. Follows commands Musculoskeletal:  Increased tone in LUE elbow flexors and LLE hip adductors and hip and knee extensors, increased tone in ankle plantarflexors on Left - improved Note: Kinesiotape on left leg.  PE unchanged from 02/04/22  Medical Problem List and Plan: 1. Functional deficits secondary to intraparenchymal hematoma in the right rontal lobe             -patient may  shower             -ELOS/Goals: 12-14 days min A CGA, expected dc 5/31,   -SNF option is being investigated             -Continue CIR for PT, OT and SLP  -CT head after fall 5/15 without acute changes, discussed fall prevention -increased tone in RLE especially plantarflexion of ankle interfering with gait, this is improving with medication and stretching 2.  Antithrombotics: -DVT/anticoagulation:  Mechanical: Antiembolism stockings, thigh (TED hose) Bilateral lower extremities             No Lovenoxetc due to IPH             -antiplatelet therapy: N/A 3. Pain Management: 4. Mood: Effexor 75 mg daily             -antipsychotic agents: N/A  -5/24 reports she continues to have good mood overall, continue Effexor 5. Neuropsych: This patient is capable of making decisions on her own behalf. 6. Skin/Wound Care: Routine skin check 7. Fluids/Electrolytes/Nutrition: Routine in and out with follow-up chemistries  -5/11 midline removed yesterday 8.  Seizure disorder- new onset since  IPH-.  Keppra 1000 mg every 12 hours  -5/26 No additional seizure activity since at CIR 9.  Hypertension.  Cozaar 100 mg daily.  Monitor with increased mobility           -5/25 BP well controlled, continue cozaar '100mg'$  dialy          5/27 controlled but some soft bp's continue to monitor, consider dose adjustment    02/05/2022    1:22 PM 02/05/2022    4:59 AM 02/04/2022    8:00 PM  Vitals with BMI  Systolic [phone]  Diastolic 54 67 60  Pulse 93 77 82    10.  History of left breast cancer with radioactive seed localization 08/28/2020.  Follow-up outpatient 11. Hx of R frontal ICH 2017 12. Poor appetite- might need supplements or appetite stimulants  -5/10 albumin a little low, encourage PO intake             -5/25 She is eating most of her meals, follow 13. Diarrhea , improved  5/23 Normal BM this AM, follow              5/27 LBM 5/25 normal, CTM 14. UTI     -UA on 5/18: + leuks, + nitrite, many bacteria, culture pending.      -Pt started on Bactrim DS on 01/28/22.       -5/22 no dysuria this AM, culture shows ecoli that was sensitive to bactrim, continue to monitor  -5/23 WBC wnl              5/27 resolved, no urinary symptoms today. 15. Spasticity, increased tone in LLE particularly with ankle plantarflexors  -5/23 will order dorsiflexion night splint  -5/23 Tizanidine QHS started  -5/25 Increase Tizanidine to '2mg'$  BID, tone has improved with medication  -5/26 Continue tizanidine current dose 16. Constipation  5/26 Pt reports she had BM yesterday, do not see this documented, Miralax PRN           5/27 LBM 5/25, continue to use prn's as needed.   LOS: 17 days A FACE TO FACE EVALUATION WAS PERFORMED  Luetta Nutting 02/05/2022, 5:58 PM

## 2022-02-06 NOTE — Progress Notes (Signed)
PROGRESS NOTE   Subjective/Complaints: Reports that foot splint sometimes hurts if worn for too long a period at night. Had BM yesterday.  Sitting up in chair and eating.  Review of Systems  Constitutional: Negative.   Respiratory:  Negative for shortness of breath.   Cardiovascular:  Negative for chest pain and palpitations.  Gastrointestinal:  Negative for constipation, diarrhea, nausea and vomiting.  Musculoskeletal:  Negative for falls.  Skin: Negative.   Neurological:  Positive for focal weakness. Negative for dizziness and headaches.  Psychiatric/Behavioral:  Negative for depression.     Objective:   No results found. No results for input(s): WBC, HGB, HCT, PLT in the last 72 hours.  No results for input(s): NA, K, CL, CO2, GLUCOSE, BUN, CREATININE, CALCIUM in the last 72 hours.  No intake or output data in the 24 hours ending 02/06/22 1515       Physical Exam: Vital Signs Blood pressure 128/66, pulse 73, temperature 98.1 F (36.7 C), resp. rate 18, height '5\' 6"'$  (1.676 m), weight 61.4 kg, SpO2 96 %.    Assessment/Plan: 1. Functional deficits which require 3+ hours per day of interdisciplinary therapy in a comprehensive inpatient rehab setting. Physiatrist is providing close team supervision and 24 hour management of active medical problems listed below. Physiatrist and rehab team continue to assess barriers to discharge/monitor patient progress toward functional and medical goals  Care Tool:  Bathing    Body parts bathed by patient: Right arm, Left arm, Chest, Abdomen, Front perineal area, Right upper leg, Left upper leg, Face, Left lower leg, Right lower leg, Buttocks   Body parts bathed by helper: Right lower leg, Left lower leg, Buttocks     Bathing assist Assist Level: Contact Guard/Touching assist     Upper Body Dressing/Undressing Upper body dressing   What is the patient wearing?: Bra, Pull over  shirt    Upper body assist Assist Level: Minimal Assistance - Patient > 75% (set up shirt, min bra)    Lower Body Dressing/Undressing Lower body dressing      What is the patient wearing?: Underwear/pull up, Pants     Lower body assist Assist for lower body dressing: Contact Guard/Touching assist     Toileting Toileting    Toileting assist Assist for toileting: Moderate Assistance - Patient 50 - 74%     Transfers Chair/bed transfer  Transfers assist     Chair/bed transfer assist level: Minimal Assistance - Patient > 75%     Locomotion Ambulation   Ambulation assist      Assist level: 2 helpers Assistive device: Hand held assist Max distance: 200 ft   Walk 10 feet activity   Assist  Walk 10 feet activity did not occur: Safety/medical concerns  Assist level: 2 helpers Assistive device: Hand held assist   Walk 50 feet activity   Assist Walk 50 feet with 2 turns activity did not occur: Safety/medical concerns  Assist level: 2 helpers Assistive device: Hand held assist    Walk 150 feet activity   Assist Walk 150 feet activity did not occur: Safety/medical concerns  Assist level: 2 helpers Assistive device: Hand held assist    Walk 10 feet on  uneven surface  activity   Assist Walk 10 feet on uneven surfaces activity did not occur: Safety/medical concerns         Wheelchair     Assist Is the patient using a wheelchair?: Yes Type of Wheelchair: Manual    Wheelchair assist level: Total Assistance - Patient < 25% Max wheelchair distance: 150    Wheelchair 50 feet with 2 turns activity    Assist        Assist Level: Total Assistance - Patient < 25%   Wheelchair 150 feet activity     Assist      Assist Level: Total Assistance - Patient < 25%   Blood pressure 128/66, pulse 73, temperature 98.1 F (36.7 C), resp. rate 18, height '5\' 6"'$  (1.676 m), weight 61.4 kg, SpO2 96 %.   General: Alert, No apparent distress. In  wheelchair HEENT: Head is normocephalic, EOMI, PERRLA, oral mucosa pink and moist Neck: Supple Heart: Reg rate and rhythm. No murmurs or extra heart sounds. Chest: CTA bilaterally Abdomen: Soft, non-tender, non-distended, bowel sounds positive. Extremities: No clubbing, cyanosis, or edema. Psych: normal affect. Pleasant. Some impulsivity at times. Skin: warm and dry Neuro:  Alert and oriented x3.  Still has neglect of left side-improving. CN II-X12 grossly intact.  Sensation intact x4. Follows commands Musculoskeletal:  Increased tone in LUE elbow flexors and LLE hip adductors and hip and knee extensors, increased tone in ankle plantarflexors on Left - improved Note: Kinesiotape on left leg. Reports that foot splint worn at night can be painful.   Medical Problem List and Plan: 1. Functional deficits secondary to intraparenchymal hematoma in the right rontal lobe             -patient may  shower             -ELOS/Goals: 12-14 days min A CGA, expected dc 5/31,   -SNF option is being investigated             -Continue CIR for PT, OT and SLP  -CT head after fall 5/15 without acute changes, discussed fall prevention -increased tone in RLE especially plantarflexion of ankle interfering with gait, this is improving with medication and stretching 5/28 Pt reports that left leg splint worn at night is causing pain, consider options for different splint type or duration of use. 2.  Antithrombotics: -DVT/anticoagulation:  Mechanical: Antiembolism stockings, thigh (TED hose) Bilateral lower extremities             No Lovenoxetc due to IPH             -antiplatelet therapy: N/A 3. Pain Management: 4. Mood: Effexor 75 mg daily             -antipsychotic agents: N/A  -5/24 reports she continues to have good mood overall, continue Effexor 5. Neuropsych: This patient is capable of making decisions on her own behalf. 6. Skin/Wound Care: Routine skin check 7. Fluids/Electrolytes/Nutrition: Routine in  and out with follow-up chemistries  -5/11 midline removed yesterday 8.  Seizure disorder- new onset since IPH-.  Keppra 1000 mg every 12 hours  -5/26 No additional seizure activity since at CIR 9.  Hypertension.  Cozaar 100 mg daily.  Monitor with increased mobility           -5/25 BP well controlled, continue cozaar '100mg'$  dialy          5/27 controlled but some soft bp's continue to monitor, consider dose adjustment 5/28 SBP 120's today, continue anti-hypertensive regimen.  02/06/2022    5:30 AM 02/05/2022    8:00 PM 02/05/2022    1:22 PM  Vitals with BMI  Systolic 701 779 390  Diastolic 66 61 54  Pulse 73 87 93    10.  History of left breast cancer with radioactive seed localization 08/28/2020.  Follow-up outpatient 11. Hx of R frontal ICH 2017 12. Poor appetite- might need supplements or appetite stimulants  -5/10 albumin a little low, encourage PO intake             -5/25 She is eating most of her meals, follow 13. Diarrhea , improved  5/23 Normal BM this AM, follow              5/27 LBM 5/25 normal, CTM              5/28 LBM on 02/05/22 14. UTI     -UA on 5/18: + leuks, + nitrite, many bacteria, culture pending.      -Pt started on Bactrim DS on 01/28/22.       -5/22 no dysuria this AM, culture shows ecoli that was sensitive to bactrim, continue to monitor  -5/23 WBC wnl              5/27 resolved, no urinary symptoms today. 15. Spasticity, increased tone in LLE particularly with ankle plantarflexors  -5/23 will order dorsiflexion night splint  -5/23 Tizanidine QHS started  -5/25 Increase Tizanidine to '2mg'$  BID, tone has improved with medication  -5/26 Continue tizanidine current dose          5/28 Continue 2 mg tizanidine BID 16. Constipation  5/26 Pt reports she had BM yesterday, do not see this documented, Miralax PRN           5/27 LBM 5/25, continue to use prn's as needed.           5/28 LBM on 02/05/22   LOS: 18 days A FACE TO FACE EVALUATION WAS PERFORMED  Cynthia [name] 02/06/2022, 3:15 PM

## 2022-02-06 NOTE — Progress Notes (Signed)
Physical Therapy Session Note  Patient Details  Name: Cynthia [name] MRN: [id number] Date of Birth: Feb 08, 1946  Today's Date: 02/06/2022 PT Individual Time: 1000-1050 PT Individual Time Calculation (min): 50 min   Short Term Goals: Week 1:  PT Short Term Goal 1 (Week 1): Patient will complete supine <> sit with MinA consistently PT Short Term Goal 1 - Progress (Week 1): Progressing toward goal PT Short Term Goal 2 (Week 1): Patient will complete sit <> stand with LRAD and ModA consistently PT Short Term Goal 2 - Progress (Week 1): Met PT Short Term Goal 3 (Week 1): Patient will ambulate at least 51f with LRAD and ModA + wc follow as needed PT Short Term Goal 3 - Progress (Week 1): Met PT Short Term Goal 4 (Week 1): Patient will initiate stair training PT Short Term Goal 4 - Progress (Week 1): Met  Skilled Therapeutic Interventions/Progress Updates:  TRANSFERS: Min assist for sit<>stand transfers with session (wheelchair<>RW, mat table<>RW) with cues for hand placement, to ensure left foot placement and for weight shifting.   STRENGTHENING:  Seated in wheelchair- passive left hamstring stretching for 60 seconds x 3 reps, then with forefoot propped on small wooden box (~1 inch tall) overpressure at knee for heel cord stretching for 30 seconds x 3 reps.  With red band: heel slides with foot on pillow case for 10 reps, emphasis placed on keeping foot flat and in contact with floor with each rep.    GAIT: Gait pattern: step to pattern, decreased step length- Right, decreased stance time- Left, decreased ankle dorsiflexion- Left, trunk flexed, and narrow BOS Distance walked: 200 x 1 Assistive device utilized: Walker - 2 wheeled Level of assistance:  min guard to min assist for balance with PTA pulling wheelchair for safety Comments: cues for forward gaze (not to look down), for step length and weight shifting. Pt's left foot did go to floor after ~50 feet so pt was not walking on  toes/forefoot. Attempted to don posterior light weight [place] walk on brace prior to gait. Unable to get enough DF to get heel to seat into shoe.    BALANCE/NMR: Seated on air disc: working on reaching in various directions for trunk elongation on both sides as pt was noted to be rotated with gait and tight (possibly also due to tone). Forward reaching with pelvic depressions, upward reaching with pelvic depression. Had pt hold PTA hand to assist with reaching UE's so to get full range available and not have pt overwork/over activate muscles for increased stretching. Feet on wooden block to allow them to offer support while doing this.   Pt transported back to her room via wheelchair. Pt left in room with all needs in reach and belt alarm on. No issues reported after session.   Therapy Documentation Precautions:  Precautions Precautions: Fall Precaution Comments: L LE extensor tone Restrictions Weight Bearing Restrictions: No Other Position/Activity Restrictions: tendency to stand on toes on L foot due to extension tone/L ankle contracture     Therapy/Group: Individual Therapy  KWillow Ora PTA, CEl Cerro05/28/23, 3:49 PM

## 2022-02-07 DIAGNOSIS — I159 Secondary hypertension, unspecified: Secondary | ICD-10-CM

## 2022-02-07 DIAGNOSIS — N390 Urinary tract infection, site not specified: Secondary | ICD-10-CM

## 2022-02-07 NOTE — Progress Notes (Signed)
Occupational Therapy Session Note  Patient Details  Name: Cynthia [name] MRN: [id number] Date of Birth: 1946/05/17  Today's Date: 02/07/2022 OT Group Time: 9163-8466 OT Group Time Calculation (min): 60 min   Short Term Goals: Week 3:  OT Short Term Goal 1 (Week 3): Pt will demonstrate improved L side awareness and problem solving to don a bra with set up. OT Short Term Goal 2 (Week 3): Pt will demonstrate improved L side awareness and problem solving to transfer out of shower or to the toilet going to her LEFT side with CGA and only min cues. OT Short Term Goal 3 (Week 3): Pt will demonstrate improved memory and awareness to recall where to place R foot and hands during sit to stands with 1 cue or less.  Skilled Therapeutic Interventions/Progress Updates:  Session focus on group education session to follow up on previous stress mgmt session. Reviewed previous topic of how to categorize stressors into "daily hassles," "major life stressors" and "life circumstances" in an effort to allow pts to chunk their stressors into groups and determine where to best put their efforts/time when dealing with stress. Additionally reviewed protective factors to manage our stressors such as "daily uplifts," "healthy coping skills," and "protective factors." Reviewed healthy vs unhealthy coping strategies. Practiced healthy coping strategy of deep breathing, education provided on resources that can be used to help facilitate improved deep breathing strategies.  Provided new information of how to use gratitude as a coping strategy for stress mgmt. education provided on how to start a gratitude journal during LOS in an effort to manage stressors and bring awareness to things that are going well in your life. Education provided on idea of completing a gratitude card/ letter to someone who you are currently grateful for.  Handouts provided to reinforce all education.   Provided transportation back to room by this OTA  with total A from w/c, pt completed stand pivot back to EOB with Rw and MINA, MOD cues for sequencing of steps and RW mgmt. Pt left in sidelying with all needs within reach and bed alarm activated.  Therapy Documentation Precautions:  Precautions Precautions: Fall Precaution Comments: L LE extensor tone Restrictions Weight Bearing Restrictions: No Other Position/Activity Restrictions: tendency to stand on toes on L foot due to extension tone/L ankle contracture  Pain:no pain reported during session     Therapy/Group: Group Therapy  Precious Haws 02/07/2022, 4:22 PM

## 2022-02-07 NOTE — Progress Notes (Signed)
Speech Language Pathology Daily Session Note  Patient Details  Name: Cynthia [name] MRN: [id number] Date of Birth: [birth year]  Today's Date: 02/07/2022 SLP Individual Time: 1016-1100 SLP Individual Time Calculation (min): 44 min  Short Term Goals: Week 3: SLP Short Term Goal 1 (Week 3): STG's = LTG's due to [place] services focused on cognitive skills. Pt demonstrated increase recall of recent events. Pt appeared to recall general task completed with AM PT/OT (events were not recorded in memory notebook), SLP recorded events. Pt also demonstrated recall of most recent SLP session (counting memory) and intellectual awareness of increased difficulty completing a basic task. Pt supports need for 24 hour supervision and is agreeable to SNF placement to continue participation in rehab services.  SLP facilitated basic problem solving, error awareness and recall in familiar basic PEG design task, pt required max A verbal cues reducing to mod A verbal cues with step by step instruction. Pt demonstrated improved awareness of errors when step by step instruction was given. Pt continues to demonstrate disorganization, reduced recall, reduced sustained attention, awareness of errors and problem solving skills. Pt was left in room with call bell within reach and chair alarm set. SLP recommends to continue skilled services.     Pain Pain Assessment Pain Score: 0-No pain  Therapy/Group: Individual Therapy  Maguire Sime  [facility] 02/07/2022, 12:03 PM

## 2022-02-07 NOTE — Progress Notes (Signed)
Physical Therapy Session Note  Patient Details  Name: Cynthia [name] MRN: [id number] Date of Birth: 1946-08-07  Today's Date: 02/07/2022 PT Individual Time: 0815-0900 PT Individual Time Calculation (min): 45 min   Short Term Goals: Week 3:  PT Short Term Goal 1 (Week 3): STG=LTG due to ELOS.  Skilled Therapeutic Interventions/Progress Updates: Pt presents sitting semi-reclined in bed and agreeable to therapy.  Pt states soiled brief.  Long stretches to L HC x 3 for improved dorsiflexion.  Pt transferred sup to sit at EOB w/ CGA and cueing and re-direct to task.  Pt manually assists bringing LLE off EOB.  Pt required max A to don overhead shirt and remove gown.  Pt requires verbal cues for sequencing of shirt don for LUE first.  Total A to don B shoes in sitting w/ supervision, occasional CGA for seated balance.  Pt transfers sit to stand w/ min A for mod A to pull up brief w/ cueing for posterior/L lateral bias w/ activity.  Pt returned to sitting to complete fastening of brief.  Pt transferred sit to stand w/ min A and mod to max A for pulling up pants w/ same LOB requiring max A to maintain balance.  Pt amb x 4' w/ RW and min A to w/c w/ verbal cues for increased foot clearance and min A for facilitation for weight shift.  Pt wheeled to sink, pt transferred sit to stand w/ min A and performed standing activity of brushing teeth w/ verbal and occasional tactile cues for L knee extension.  Pt heel reaching ground w/ weight bearing.  Pt remained sitting in w/c w/ chair alarm on and all needs in reach, Telesitter present.     Therapy Documentation Precautions:  Precautions Precautions: Fall Precaution Comments: L LE extensor tone Restrictions Weight Bearing Restrictions: No Other Position/Activity Restrictions: tendency to stand on toes on L foot due to extension tone/L ankle contracture General:   Vital Signs: Therapy Vitals Temp: 97.8 F (36.6 C) Pulse Rate: 76 Resp: 16 BP: (!)  123/57 Patient Position (if appropriate): Lying Oxygen Therapy SpO2: 96 % O2 Device: Room Air Pain:no c/o pain.       Therapy/Group: Individual Therapy  Cynthia [name] 02/07/2022, 9:01 AM

## 2022-02-07 NOTE — Progress Notes (Signed)
Recreational Therapy Session Note  Patient Details  Name: Cynthia [name] MRN: [id number] Date of Birth: [birth year] Today's Date: 02/07/2022  Pt participated in animal assisted activity seated w/c level with supervision.  Pt petting Dixie with both hands and easily engaged in conversation with pet partner team.  Pt anxious to return home to see her own dog.  Pt appreciative of this visit.  Per policy, hand hygiene performed piror to and after pet interaction.  [place] 02/07/2022, 1:49 PM

## 2022-02-07 NOTE — Progress Notes (Signed)
Recreational Therapy Session Note  Patient Details  Name: Reyana Leisey MRN: [id number] Date of Birth: [birth year] Today's Date: 02/07/2022  Pain: no c/o Skilled Therapeutic Interventions/Progress Updates: Session was a follow up to last week's stress management/coping session per group member requests.  Group members requested review of previously discussed stress exploration topics.  Reviewed and listed factors that contribute to stress and factors that protect against stress..  Emphasis this week on providing more in depth instructions/information on deep breathing, imagery, challenging irrational thoughts and gratitude.  Hand out provided.  Pt express appreciation for another group session on this topic and with the same participants from last week.   [place] 02/07/2022, 4:20 PM

## 2022-02-07 NOTE — Progress Notes (Addendum)
Occupational Therapy Session Note  Patient Details  Name: Cynthia [name] MRN: [id number] Date of Birth: [birth year]  Today's Date: 02/07/2022 OT Individual Time: 3474-2595 and 1300-1345 OT Individual Time Calculation (min): 30 min and 45 min   Short Term Goals: Week 3:  OT Short Term Goal 1 (Week 3): Pt will demonstrate improved L side awareness and problem solving to don a bra with set up. OT Short Term Goal 2 (Week 3): Pt will demonstrate improved L side awareness and problem solving to transfer out of shower or to the toilet going to her LEFT side with CGA and only min cues. OT Short Term Goal 3 (Week 3): Pt will demonstrate improved memory and awareness to recall where to place R foot and hands during sit to stands with 1 cue or less.  Skilled Therapeutic Interventions/Progress Updates:    Visit 1: Pain: no c/o pain  Pt received up in wc dressed and ready for the day.  She had just finished her PT session.  Focus of this session was on controlled sit to stands working on techniques to address L inattention and motor planning challenges. Initial practice with pushing up with B hands on wc arm rests for a forward wt shift and then transferring hands to walker. She was having difficulty coming forward with enough of a weight shift so positioned her w/c facing side of bed.  Elevated bed rail and had pt practice pushing up and leaning forward to look over rail at phone on bed. She then transferred L hand to rail, then R hand to stand tall.  Reversed method sitting, always having her use L hand first for forced use. Pt progressed from mod to light CGA in this session with multiple sit to stands.  Will see how her carry over is in the next session. Pt resting in wc with all needs met and belt alarm on.   Visit 2: Pain: no c/o pain  Pt received in wc eating her lunch. She stated she was having difficulty cutting up her baked potato and managing her hamburger.  Pt was unaware that a leaking  sour cream package was still in her L hand.  Assisted pt with cleaning hand, cutting up potato and preparing her burger to eat.  Pt sat and ate her food for a few minutes while we discussed current events.   Pt finished and I asked her if she needed to toilet.  Pt declined. Initiated having pt work on standing to a RW to practice sit to stand methods she used this morning. Pt started to stand up and then just stopped. Pt stated "I am trying hard not to leak on myself". Suggested we get her to the toilet right away.  Pt transported to bathroom via wc and then pt used grab bar to stand to turn to the left to sit on toilet with max cues as she was having difficulty motor planning.  Mod A with clothing management due to tight pants.  Pt's brief was soaked with urine and it had leaked onto pants and w/c cushion and pad.  Mod cues for upright posture as she was leaning back against toilet bowl making it difficult to manage clothing over thighs.  Pt had cleansed with paper and handed a wet washcloth to wipe.  While she was doing this, I was rinsing of roho cushion in the shower as urine had leaked between the cells. I was right next to pt with my back turned and she had stood  up by herself to reach for a towel.  Reminded her she should not stand by herself and I was only a foot away to ask.  Pt said "I wasn't thinking as I can usually stand by myself. "   Pt transferred back to wc and obtained new cushion for her to use.  Pt resting in wc with new clothing on, belt alarm on and telesitter on.    Therapy Documentation Precautions:  Precautions Precautions: Fall Precaution Comments: L LE extensor tone Restrictions Weight Bearing Restrictions: No Other Position/Activity Restrictions: tendency to stand on toes on L foot due to extension tone/L ankle contracture    Vital Signs: Therapy Vitals Temp: 97.8 F (36.6 C) Pulse Rate: 76 Resp: 16 BP: (!) 123/57 Patient Position (if appropriate): Lying Oxygen  Therapy SpO2: 96 % O2 Device: Room Air Pain:   ADL: ADL Eating: Set up Where Assessed-Eating: Wheelchair Grooming: Setup Where Assessed-Grooming: Clinical biochemist Bathing: Supervision/safety Where Assessed-Upper Body Bathing: Shower Lower Body Bathing: Contact guard Where Assessed-Lower Body Bathing: Shower Upper Body Dressing: Minimal assistance Where Assessed-Upper Body Dressing: Wheelchair Lower Body Dressing: Contact guard Where Assessed-Lower Body Dressing: Wheelchair Toileting: Moderate assistance Where Assessed-Toileting: Glass blower/designer: Psychiatric nurse Method: Ambulating, Stand pivot Science writer: Raised toilet seat Tub/Shower Transfer: Unable to assess Tub/Shower Transfer Method: Unable to assess Social research officer, government: Minimal assistance Social research officer, government Method: Ambulating, Stand pivot [employer] Equipment: Transfer tub bench   Therapy/Group: Individual Therapy  [place] 02/07/2022, 8:19 AM

## 2022-02-07 NOTE — Progress Notes (Signed)
PROGRESS NOTE   Subjective/Complaints:  No new concerns or complaints. Discussed plans for SNF.  Review of Systems  Constitutional: Negative.   Respiratory:  Negative for shortness of breath.   Cardiovascular:  Negative for chest pain and palpitations.  Gastrointestinal:  Negative for constipation, diarrhea, nausea and vomiting.  Genitourinary:  Negative for dysuria.  Musculoskeletal:  Negative for falls.  Skin: Negative.   Neurological:  Positive for focal weakness.  Psychiatric/Behavioral:  Negative for depression.     Objective:   No results found. No results for input(s): WBC, HGB, HCT, PLT in the last 72 hours.  No results for input(s): NA, K, CL, CO2, GLUCOSE, BUN, CREATININE, CALCIUM in the last 72 hours.   Intake/Output Summary (Last 24 hours) at 02/07/2022 1032 Last data filed at 02/07/2022 0900 Gross per 24 hour  Intake 476 ml  Output --  Net 476 ml         Physical Exam: Vital Signs Blood pressure (!) 123/57, pulse 76, temperature 97.8 F (36.6 C), resp. rate 16, height '5\' 6"'$  (1.676 m), weight 61.4 kg, SpO2 96 %.    Assessment/Plan: 1. Functional deficits which require 3+ hours per day of interdisciplinary therapy in a comprehensive inpatient rehab setting. Physiatrist is providing close team supervision and 24 hour management of active medical problems listed below. Physiatrist and rehab team continue to assess barriers to discharge/monitor patient progress toward functional and medical goals  Care Tool:  Bathing    Body parts bathed by patient: Right arm, Left arm, Chest, Abdomen, Front perineal area, Right upper leg, Left upper leg, Face, Left lower leg, Right lower leg, Buttocks   Body parts bathed by helper: Right lower leg, Left lower leg, Buttocks     Bathing assist Assist Level: Contact Guard/Touching assist     Upper Body Dressing/Undressing Upper body dressing   What is the patient  wearing?: Bra, Pull over shirt    Upper body assist Assist Level: Minimal Assistance - Patient > 75% (set up shirt, min bra)    Lower Body Dressing/Undressing Lower body dressing      What is the patient wearing?: Underwear/pull up, Pants     Lower body assist Assist for lower body dressing: Contact Guard/Touching assist     Toileting Toileting    Toileting assist Assist for toileting: Moderate Assistance - Patient 50 - 74%     Transfers Chair/bed transfer  Transfers assist     Chair/bed transfer assist level: Minimal Assistance - Patient > 75%     Locomotion Ambulation   Ambulation assist      Assist level: 2 helpers Assistive device: Hand held assist Max distance: 200 ft   Walk 10 feet activity   Assist  Walk 10 feet activity did not occur: Safety/medical concerns  Assist level: 2 helpers Assistive device: Hand held assist   Walk 50 feet activity   Assist Walk 50 feet with 2 turns activity did not occur: Safety/medical concerns  Assist level: 2 helpers Assistive device: Hand held assist    Walk 150 feet activity   Assist Walk 150 feet activity did not occur: Safety/medical concerns  Assist level: 2 helpers Assistive device: Hand held assist  Walk 10 feet on uneven surface  activity   Assist Walk 10 feet on uneven surfaces activity did not occur: Safety/medical concerns         Wheelchair     Assist Is the patient using a wheelchair?: Yes Type of Wheelchair: Manual    Wheelchair assist level: Total Assistance - Patient < 25% Max wheelchair distance: 150    Wheelchair 50 feet with 2 turns activity    Assist        Assist Level: Total Assistance - Patient < 25%   Wheelchair 150 feet activity     Assist      Assist Level: Total Assistance - Patient < 25%   Blood pressure (!) 123/57, pulse 76, temperature 97.8 F (36.6 C), resp. rate 16, height '5\' 6"'$  (1.676 m), weight 61.4 kg, SpO2 96 %.   General:  Alert, No apparent distress.  Sitting in WC HEENT: Head is normocephalic, EOMI, PERRLA, oral mucosa pink and moist Neck: Supple Heart:  No murmurs or extra heart sounds. Chest:  normal effort, non-labored Abdomen: Soft, non-tender, non-distended, bowel sounds positive. Extremities: No clubbing, cyanosis, or edema. Psych: normal affect. Pleasant. Some impulsivity at times. Skin: warm and dry Neuro:  Alert and oriented x3.  Still has neglect of left side-improving. CN II-X12 grossly intact.  Sensation intact x4. Follows commands Musculoskeletal:  Increased tone in LUE elbow flexors and LLE hip adductors and hip and knee extensors, increased tone in ankle plantarflexors on Left - improved Note: Kinesiotape on left leg.    Medical Problem List and Plan: 1. Functional deficits secondary to intraparenchymal hematoma in the right rontal lobe             -patient may  shower             -ELOS/Goals: 12-14 days min A CGA, expected dc 5/31,   -SNF option is being investigated             -Continue CIR for PT, OT and SLP  -CT head after fall 5/15 without acute changes, discussed fall prevention -increased tone in RLE especially plantarflexion of ankle interfering with gait, this is improving with medication and stretching 5/28 Pt reports that left leg splint worn at night is causing pain, consider options for different splint type or duration of use. -Walked 244f 2.  Antithrombotics: -DVT/anticoagulation:  Mechanical: Antiembolism stockings, thigh (TED hose) Bilateral lower extremities             No Lovenoxetc due to IPH             -antiplatelet therapy: N/A 3. Pain Management: 4. Mood: Effexor 75 mg daily             -antipsychotic agents: N/A  -5/24 reports she continues to have good mood overall, continue Effexor 5. Neuropsych: This patient is capable of making decisions on her own behalf. 6. Skin/Wound Care: Routine skin check 7. Fluids/Electrolytes/Nutrition: Routine in and out with  follow-up chemistries  -5/11 midline removed yesterday 8.  Seizure disorder- new onset since IPH-.  Keppra 1000 mg every 12 hours  -5/26 No additional seizure activity since at CIR 9.  Hypertension.  Cozaar 100 mg daily.  Monitor with increased mobility           -5/25 BP well controlled, continue cozaar '100mg'$  dialy          5/29 BP well controlled overall continue current regimen    02/07/2022    6:18 AM 02/06/2022  7:24 PM 02/06/2022    5:30 AM  Vitals with BMI  Systolic 161 096 045  Diastolic 57 78 66  Pulse 76 84 73    10.  History of left breast cancer with radioactive seed localization 08/28/2020.  Follow-up outpatient 11. Hx of R frontal ICH 2017 12. Poor appetite- might need supplements or appetite stimulants  -5/10 albumin a little low, encourage PO intake             -5/29 She is eating most of her meals, ate 100 percent of breakfast follow 13. Diarrhea , improved  5/23 Normal BM this AM, follow              5/27 LBM 5/25 normal, CTM              5/28 LBM on 02/05/22 14. UTI     -UA on 5/18: + leuks, + nitrite, many bacteria, culture pending.      -Pt started on Bactrim DS on 01/28/22.       -5/22 no dysuria this AM, culture shows ecoli that was sensitive to bactrim, continue to monitor  -5/29 No symptoms of UTI this morning, continue to follow 15. Spasticity, increased tone in LLE particularly with ankle plantarflexors  -5/23 will order dorsiflexion night splint  -5/23 Tizanidine QHS started  -5/25 Increase Tizanidine to '2mg'$  BID, tone has improved with medication  -5/26 Continue tizanidine current dose          5/28 Continue 2 mg tizanidine BID 16. Constipation  5/26 Pt reports she had BM yesterday, do not see this documented, Miralax PRN           5/27 LBM 5/25, continue to use prn's as needed.           5/28 LBM on 02/05/22   LOS: 19 days A FACE TO FACE EVALUATION WAS PERFORMED  Jennye Boroughs 02/07/2022, 10:32 AM

## 2022-02-08 LAB — BASIC METABOLIC PANEL
Anion gap: 5 (ref 5–15)
BUN: 9 mg/dL (ref 8–23)
CO2: 31 mmol/L (ref 22–32)
Calcium: 9.2 mg/dL (ref 8.9–10.3)
Chloride: 98 mmol/L (ref 98–111)
Creatinine, Ser: 0.76 mg/dL (ref 0.44–1.00)
GFR, Estimated: >60 mL/min (ref >60)
Glucose, Bld: 90 mg/dL (ref 70–99)
Potassium: 4.4 mmol/L (ref 3.5–5.1)
Sodium: 134 mmol/L — ABNORMAL LOW (ref 135–145)

## 2022-02-08 LAB — CBC
HCT: 35.4 % — ABNORMAL LOW (ref 36.0–46.0)
Hemoglobin: 11.9 g/dL — ABNORMAL LOW (ref 12.0–15.0)
MCH: 30.6 pg (ref 26.0–34.0)
MCHC: 33.6 g/dL (ref 30.0–36.0)
MCV: 91 fL (ref 80.0–100.0)
Platelets: 262 10*3/uL (ref 150–400)
RBC: 3.89 MIL/uL (ref 3.87–5.11)
RDW: 12.1 % (ref 11.5–15.5)
WBC: 5.3 10*3/uL (ref 4.0–10.5)
nRBC: 0 % (ref 0.0–0.2)

## 2022-02-08 MED ORDER — TIZANIDINE HCL 2 MG PO TABS
2.0000 mg | ORAL_TABLET | Freq: Every morning | ORAL | Status: DC
  Administered 2022-02-09: 2 mg via ORAL
  Filled 2022-02-08: qty 1

## 2022-02-08 MED ORDER — TIZANIDINE HCL 2 MG PO TABS
2.0000 mg | ORAL_TABLET | Freq: Every morning | ORAL | 0 refills | Status: DC
Start: 2022-02-09 — End: 2023-01-31

## 2022-02-08 MED ORDER — ALPRAZOLAM 0.5 MG PO TABS
0.5000 mg | ORAL_TABLET | Freq: Three times a day (TID) | ORAL | 0 refills | Status: DC | PRN
Start: 2022-02-08 — End: 2022-03-07

## 2022-02-08 MED ORDER — TIZANIDINE HCL 4 MG PO TABS: 4.0000 mg | ORAL_TABLET | Freq: Every day | ORAL | 0 refills | Status: DC

## 2022-02-08 MED ORDER — ACETAMINOPHEN 325 MG PO TABS: 650.0000 mg | ORAL_TABLET | Freq: Four times a day (QID) | ORAL | Status: DC | PRN

## 2022-02-08 MED ORDER — TIZANIDINE HCL 2 MG PO TABS: 4.0000 mg | ORAL_TABLET | Freq: Two times a day (BID) | ORAL | Status: DC

## 2022-02-08 MED ORDER — TIZANIDINE HCL 2 MG PO TABS
4.0000 mg | ORAL_TABLET | Freq: Every day | ORAL | Status: DC
  Administered 2022-02-08: 4 mg via ORAL
  Filled 2022-02-08: qty 2

## 2022-02-08 NOTE — Progress Notes (Signed)
PROGRESS NOTE   Subjective/Complaints:  Increased spasticity this AM but has improved with therapy.  Increased urinary frequency noted.    Review of Systems  Constitutional: Negative.  Negative for chills and fever.  Respiratory:  Negative for shortness of breath.   Cardiovascular:  Negative for chest pain and palpitations.  Gastrointestinal:  Negative for constipation, diarrhea, nausea and vomiting.  Genitourinary:  Positive for frequency and urgency. Negative for dysuria.  Musculoskeletal:  Negative for falls.  Skin: Negative.   Neurological:  Positive for focal weakness.  Psychiatric/Behavioral:  Negative for depression.     Objective:   No results found. Recent Labs    02/08/22 0618  WBC 5.3  HGB 11.9*  HCT 35.4*  PLT 262    Recent Labs    02/08/22 0618  NA 134*  K 4.4  CL 98  CO2 31  GLUCOSE 90  BUN 9  CREATININE 0.76  CALCIUM 9.2     Intake/Output Summary (Last 24 hours) at 02/08/2022 0816 Last data filed at 02/08/2022 1610 Gross per 24 hour  Intake 840 ml  Output --  Net 840 ml         Physical Exam: Vital Signs Blood pressure 140/72, pulse 72, temperature 98 F (36.7 C), resp. rate 18, height '5\' 6"'$  (1.676 m), weight 61.4 kg, SpO2 98 %.    Assessment/Plan: 1. Functional deficits which require 3+ hours per day of interdisciplinary therapy in a comprehensive inpatient rehab setting. Physiatrist is providing close team supervision and 24 hour management of active medical problems listed below. Physiatrist and rehab team continue to assess barriers to discharge/monitor patient progress toward functional and medical goals  Care Tool:  Bathing    Body parts bathed by patient: Right arm, Left arm, Chest, Abdomen, Front perineal area, Right upper leg, Left upper leg, Face, Left lower leg, Right lower leg, Buttocks   Body parts bathed by helper: Right lower leg, Left lower leg, Buttocks      Bathing assist Assist Level: Contact Guard/Touching assist     Upper Body Dressing/Undressing Upper body dressing   What is the patient wearing?: Bra, Pull over shirt    Upper body assist Assist Level: Minimal Assistance - Patient > 75% (set up shirt, min bra)    Lower Body Dressing/Undressing Lower body dressing      What is the patient wearing?: Underwear/pull up, Pants     Lower body assist Assist for lower body dressing: Contact Guard/Touching assist     Toileting Toileting    Toileting assist Assist for toileting: Moderate Assistance - Patient 50 - 74%     Transfers Chair/bed transfer  Transfers assist     Chair/bed transfer assist level: Minimal Assistance - Patient > 75%     Locomotion Ambulation   Ambulation assist      Assist level: 2 helpers Assistive device: Hand held assist Max distance: 200 ft   Walk 10 feet activity   Assist  Walk 10 feet activity did not occur: Safety/medical concerns  Assist level: 2 helpers Assistive device: Hand held assist   Walk 50 feet activity   Assist Walk 50 feet with 2 turns activity did not occur: Safety/medical  concerns  Assist level: 2 helpers Assistive device: Hand held assist    Walk 150 feet activity   Assist Walk 150 feet activity did not occur: Safety/medical concerns  Assist level: 2 helpers Assistive device: Hand held assist    Walk 10 feet on uneven surface  activity   Assist Walk 10 feet on uneven surfaces activity did not occur: Safety/medical concerns         Wheelchair     Assist Is the patient using a wheelchair?: Yes Type of Wheelchair: Manual    Wheelchair assist level: Total Assistance - Patient < 25% Max wheelchair distance: 150    Wheelchair 50 feet with 2 turns activity    Assist        Assist Level: Total Assistance - Patient < 25%   Wheelchair 150 feet activity     Assist      Assist Level: Total Assistance - Patient < 25%   Blood  pressure 140/72, pulse 72, temperature 98 F (36.7 C), resp. rate 18, height '5\' 6"'$  (1.676 m), weight 61.4 kg, SpO2 98 %.   General: Alert, No apparent distress.  Walking with therapy HEENT: Head is normocephalic, EOMI, PERRLA, oral mucosa pink and moist Neck: Supple Heart:  No murmurs or extra heart sounds. Chest:  normal effort, non-labored Abdomen: Soft, non-tender, non-distended, bowel sounds positive. Extremities: No clubbing, cyanosis, or edema. Psych: normal affect. Pleasant. Some impulsivity at times. Skin: warm and dry Neuro:  Alert and oriented x3.  Still has neglect of left side-continues to improve CN II-X12 grossly intact.  Sensation intact x4. Follows commands Musculoskeletal:  Increased tone in LUE elbow flexors and LLE hip adductors and hip and knee extensors, increased tone in ankle plantarflexors on Left - a little increased this AM Note: Kinesiotape on left leg.    Medical Problem List and Plan: 1. Functional deficits secondary to intraparenchymal hematoma in the right rontal lobe             -patient may  shower             -ELOS/Goals: 12-14 days min A CGA, expected dc 5/31,   -SNF option is being investigated, Son considering [facility]             -Continue CIR for PT, OT and SLP  -CT head after fall 5/15 without acute changes, discussed fall prevention -increased tone in RLE especially plantarflexion of ankle interfering with gait, this is improving with medication and stretching -5/28 Pt reports that left leg splint worn at night is causing pain, consider options for different splint type or duration of use. 2.  Antithrombotics: -DVT/anticoagulation:  Mechanical: Antiembolism stockings, thigh (TED hose) Bilateral lower extremities             No Lovenoxetc due to IPH             -antiplatelet therapy: N/A 3. Pain Management: 4. Mood: Effexor 75 mg daily             -antipsychotic agents: N/A  -5/24 reports she continues to have good mood overall,  continue Effexor 5. Neuropsych: This patient is capable of making decisions on her own behalf. 6. Skin/Wound Care: Routine skin check 7. Fluids/Electrolytes/Nutrition: Routine in and out with follow-up chemistries  -5/11 midline removed yesterday 8.  Seizure disorder- new onset since IPH-.  Keppra 1000 mg every 12 hours  -5/26 No additional seizure activity since at CIR 9.  Hypertension.  Cozaar 100 mg daily.  Monitor with  increased mobility           -5/25 BP well controlled, continue cozaar '100mg'$  dialy          5/29 BP well controlled overall continue current regimen    02/08/2022    4:43 AM 02/07/2022    7:40 PM 02/07/2022    4:21 PM  Vitals with BMI  Systolic 540 981 191  Diastolic 72 62 64  Pulse 72 93 79    10.  History of left breast cancer with radioactive seed localization 08/28/2020.  Follow-up outpatient 11. Hx of R frontal ICH 2017 12. Poor appetite- might need supplements or appetite stimulants  -5/10 albumin a little low, encourage PO intake             -5/29 She is eating most of her meals, ate 100 percent of breakfast follow 13. Diarrhea , improved  5/23 Normal BM this AM, follow              5/27 LBM 5/25 normal, CTM              5/28 LBM on 02/05/22 14. UTI     -UA on 5/18: + leuks, + nitrite, many bacteria, culture pending.      -Pt started on Bactrim DS on 01/28/22.       -5/22 no dysuria this AM, culture shows ecoli that was sensitive to bactrim, continue to monitor  -5/29 No symptoms of UTI this morning, continue to follow  5/30 Repeat UA 15. Spasticity, increased tone in LLE particularly with ankle plantarflexors  -5/23 will order dorsiflexion night splint  -5/23 Tizanidine QHS started  -5/25 Increase Tizanidine to '2mg'$  BID, tone has improved with medication  -5/26 Continue tizanidine current dose              -5/28 Continue 2 mg tizanidine BID  -5/30 tizanidine increased to '4mg'$  Qhs and '2mg'$  Qam 16. Constipation  5/26 Pt reports she had BM yesterday, do not  see this documented, Miralax PRN           5/27 LBM 5/25, continue to use prn's as needed.           5/30 BM yesterday   LOS: 20 days A FACE TO FACE EVALUATION WAS PERFORMED  Jennye Boroughs 02/08/2022, 8:16 AM

## 2022-02-08 NOTE — Progress Notes (Signed)
Speech Language Pathology Weekly Progress and Session Note  Patient Details  Name: Cynthia [name] MRN: [id number] Date of Birth: September 01, 1946  Beginning of progress report period: Feb 02, 2022 End of progress report period: Feb 08, 2022  Today's Date: 02/08/2022 SLP Individual Time: 0911-1000 SLP Individual Time Calculation (min): 49 min  Short Term Goals: Week 3: SLP Short Term Goal 1 (Week 3): STG's = LTG's due to ELOS SLP Short Term Goal 1 - Progress (Week 3): Not met    New Short Term Goals: Week 4: SLP Short Term Goal 1 (Week 4): STG=LTG due to ELOS expected SNF d/c  Weekly Progress Updates: Pt is progressing towards goals overall requiring min-mod A. Pt had demonstrated improvement in daily recall and use of memory notebook as well as increased awareness/correction of functional errors. Pt's barriers continue to be selective attention due to internal distraction, basic problem solving, emergent awareness, and short term recall. Pt would continue to benefit from skilled ST services in order to maximize functional independence and reduce burden of care requiring 24 hour supervision and continued ST services.     Intensity: Minumum of 1-2 x/day, 30 to 90 minutes Frequency: 3 to 5 out of 7 days Duration/Length of Stay: SNF placement Treatment/Interventions: Cognitive remediation/compensation;Cueing hierarchy;Functional tasks;Medication managment;Patient/family education;Internal/external aids   Daily Session  Skilled Therapeutic Interventions:  Skilled ST services focused on cognitive skills. Pt missed 11 minutes of treatment due to PT running over. Pt demonstrated recall of PT events 100%. SLP facilitated carryover of problem solving strategies utilizing familiar PEG task design completed yesterday. Pt required mod A fade to min A verbal cues for completion of basic design and required max A fade to mod A verbal cues for completion of mildly complex design. Pt demonstrated overall  improvement in error awareness and ability to correct errors from yesterday's session. Pt was left in room with call bell within reach and chair alarm set. SLP recommends to continue skilled services.    General    Pain Pain Assessment Pain Score: 0-No pain  Therapy/Group: Individual Therapy  Steaven Wholey  [facility] 02/08/2022, 11:51 AM

## 2022-02-08 NOTE — Progress Notes (Signed)
Inpatient Rehabilitation Discharge Medication Review by a Pharmacist  A complete drug regimen review was completed for this patient to identify any potential clinically significant medication issues.  High Risk Drug Classes Is patient taking? Indication by Medication  Antipsychotic No   Anticoagulant No   Antibiotic No   Opioid No   Antiplatelet No   Hypoglycemics/insulin Yes   Vasoactive Medication Yes Losartan for BP  Chemotherapy No   Other Yes Alprazolam for anxiety Keppra for seizure prophylaxis Pepcid for GERD Effexor for mood Zanaflex for muscle spasms     Type of Medication Issue Identified Description of Issue Recommendation(s)  Drug Interaction(s) (clinically significant)     Duplicate Therapy     Allergy     No Medication Administration End Date     Incorrect Dose     Additional Drug Therapy Needed     Significant med changes from prior encounter (inform family/care partners about these prior to discharge).    Other       Clinically significant medication issues were identified that warrant physician communication and completion of prescribed/recommended actions by midnight of the next day:  No   Pharmacist comments: None  Time spent performing this drug regimen review (minutes):  20 minutes   Tad Moore 02/08/2022 2:29 PM

## 2022-02-08 NOTE — Progress Notes (Signed)
Recreational Therapy Discharge Summary Patient Details  Name: Cynthia [name] MRN: [id number] Date of Birth: 1946-06-24 Today's Date: 02/08/2022  Comments on progress toward goals: Pt has made good progress during LOS and is discharging to SNF for continued therapy and nursing care.  TR sessions focused on pt education in regards to activity analysis/modifications, holistic view of wellness and coping/stress management.  Pt participated in a 2 part pt education group on stress management and appreciative of this information/support.  Handouts provided.  Pt is optimistic about continued rehab and recovery. Reasons for discharge: discharge from hospital  Follow-up:  Recreation Therapy and/or participation in Activities programming recommended as available.  Patient/family agrees with progress made and goals achieved: Yes  [name] 02/08/2022, 4:06 PM

## 2022-02-08 NOTE — Progress Notes (Signed)
Occupational Therapy Session Note  Patient Details  Name: Cynthia [name] MRN: [id number] Date of Birth: [birth year]  Today's Date: 02/08/2022 OT Individual Time: 5366-4403 OT Individual Time Calculation (min): 30 min   Short Term Goals: Week 3:  OT Short Term Goal 1 (Week 3): Pt will demonstrate improved L side awareness and problem solving to don a bra with set up. OT Short Term Goal 2 (Week 3): Pt will demonstrate improved L side awareness and problem solving to transfer out of shower or to the toilet going to her LEFT side with CGA and only min cues. OT Short Term Goal 3 (Week 3): Pt will demonstrate improved memory and awareness to recall where to place R foot and hands during sit to stands with 1 cue or less.  Skilled Therapeutic Interventions/Progress Updates:    Pt greeted seated in wc and agreeable to OT treatment session. Pt reported she was just looking through her memory notebook. Pt requested to work on the stairs. She also discussed her dc plans to dc to [facility] for continued rehabilitation. Pt brought down to therapy gym and practiced stair negotiation up and down stairs 3x. Pt needed min A with cues for stepping with LLE and to move L UE along rail. Pt returned to room in wc and left seated in wc with alarm belt on, call bell in reach, and needs met.  Therapy Documentation Precautions:  Precautions Precautions: Fall Precaution Comments: L LE extensor tone Restrictions Weight Bearing Restrictions: No Other Position/Activity Restrictions: tendency to stand on toes on L foot due to extension tone/L ankle contracture Pain: Pain Assessment Pain Score: 0-No pain   Therapy/Group: Individual Therapy  Valma Cava 02/08/2022, 3:26 PM

## 2022-02-08 NOTE — Progress Notes (Signed)
Physical Therapy Session Note  Patient Details  Name: Cynthia [name] MRN: [id number] Date of Birth: 1945/10/06  Today's Date: 02/08/2022 PT Individual Time: 0810-0911 PT Individual Time Calculation (min): 61 min   Short Term Goals: Week 3:  PT Short Term Goal 1 (Week 3): STG=LTG due to ELOS.  Skilled Therapeutic Interventions/Progress Updates:     Patient in bed upon PT arrival. Patient alert and agreeable to PT session. Patient denied pain during session.  Patient reports increased frequency and incontinence of urine the past few days. Patient incontinent and continent of urine during session, MD made aware of urinary changes. Discussed patient's d/c plan, patient stated she had agreed to go to a SNF after conversations with her son and brother. She also reported awareness of cognitive deficits during session with SLP when she had challenges counting 60 cents and appropriately stated, that she couldn't manage on her own at her current level of cognitive function.   Therapeutic Activity: Bed Mobility: Patient performed supine to sit with supervision in a flat bed and min A for scooting EOB due to decreased motor planning. Provided verbal cues for reciprocal scooting and attention to her L side. Patient donned pants with mod-max A and cues for hemi-technique Transfers: Patient performed sit to/from stand from the bed, BSC over the toilet, mat table, and standard chair x2 with min A progressing to CGA, increased posterior bias initially with increased PF tone, improved with increase weight bearing in to DF. Provided verbal cues for hand placement and forward weight shift using teach-back method with moderate cues for recall. Patient performed an ambulatory transfer to/from the bathroom with min A for AD management and steadying support. Patient was incontinent and continent of urine during toileting, performed peri-care with set-up assist and lower body clothing management with total A for  energy/time management. Patient performed hand hygiene independently with CGA for standing balance without upper extremity support.   Gait Training:  Patient ambulated >200 feet and 25 feet using RW with min A-CGA. Ambulated with lateral forefoot at initial contact with increased L PF with inversion throughout, decreased L weight shift, forward trunk lean, and downward head gaze. Provided verbal cues for sequencing, increased L hamstring activation in pre-swing, heel strike at initial contact, erect posture, looking ahead, and facilitation and cues for management of AD.  Neuromuscular Re-ed: -L DF stretch with foot on second step of stairs with patient lunging forward for increased knee flexion and DF with weight bearing to reduce PF tone 2x2 min  Patient in w/c handed off to [place], SLP, at end of session.   Therapy Documentation Precautions:  Precautions Precautions: Fall Precaution Comments: L LE extensor tone Restrictions Weight Bearing Restrictions: No Other Position/Activity Restrictions: tendency to stand on toes on L foot due to extension tone/L ankle contracture    Therapy/Group: Individual Therapy  Luqman Perrelli L Astrid Vides PT, DPT  02/08/2022, 4:26 PM

## 2022-02-08 NOTE — Progress Notes (Signed)
Occupational Therapy Session Note  Patient Details  Name: Cynthia [name] MRN: [id number] Date of Birth: July 27, 1946  Today's Date: 02/08/2022 OT Individual Time: 1105-1205 OT Individual Time Calculation (min): 60 min    Short Term Goals: Week 3:  OT Short Term Goal 1 (Week 3): Pt will demonstrate improved L side awareness and problem solving to don a bra with set up. OT Short Term Goal 2 (Week 3): Pt will demonstrate improved L side awareness and problem solving to transfer out of shower or to the toilet going to her LEFT side with CGA and only min cues. OT Short Term Goal 3 (Week 3): Pt will demonstrate improved memory and awareness to recall where to place R foot and hands during sit to stands with 1 cue or less.  Skilled Therapeutic Interventions/Progress Updates:    Pt received in wc ready for therapy. Declined need to toilet at start and end of therapy. Pt taken to gym.    Transfers: Repetitive practice with stand pivot transfers with RW wc >< mat with mod -max cues for sequencing but only CGA physical help.  She continues to struggle with sequencing and spatial relations of positioning her body with RW and where she is moving to.  Balance: Repetitive practice with sit to stands from mat in which pt did have good carryover and did well sequencing. She was able to do this with close S.  Worked on standing upright and B arms reaching overhead.     Neuromuscular Re-Education:  -Coordination and sequencing with alternating hand grips on dowel bar -B hands on 2# dowel bar reaching forward focusing on keeping arms in alignment -reaching to L out of her BOS to fasten and unfasten clothes pins with L hand with fair coordination as long as pt was attending to her L side.  Pt returned to room with all needs met and belt alarm on.    Therapy Documentation Precautions:  Precautions Precautions: Fall Precaution Comments: L LE extensor tone Restrictions Weight Bearing Restrictions:  No Other Position/Activity Restrictions: tendency to stand on toes on L foot due to extension tone/L ankle contracture    Pain: no c/o pain  Pain Assessment Pain Score: 0-No pain ADL: ADL Eating: Set up Where Assessed-Eating: Wheelchair Grooming: Setup Where Assessed-Grooming: Wheelchair Upper Body Bathing: Supervision/safety Where Assessed-Upper Body Bathing: Shower Lower Body Bathing: Contact guard Where Assessed-Lower Body Bathing: Shower Upper Body Dressing: Minimal assistance Where Assessed-Upper Body Dressing: Wheelchair Lower Body Dressing: Contact guard Where Assessed-Lower Body Dressing: Wheelchair Toileting: Moderate assistance Where Assessed-Toileting: Glass blower/designer: Psychiatric nurse Method: Ambulating, Stand pivot Science writer: Raised toilet seat Tub/Shower Transfer: Unable to assess Tub/Shower Transfer Method: Unable to assess Social research officer, government: Minimal assistance Social research officer, government Method: Ambulating, Stand pivot [employer] Equipment: Transfer tub bench  Therapy/Group: Individual Therapy  [place] 02/08/2022, 12:17 PM

## 2022-02-08 NOTE — Progress Notes (Addendum)
Patient ID: Cynthia [name], female   DOB: 13-Feb-1946, 76 y.o.   MRN: [id number]   Bed offered via [place] spoke with [facility] admissions and pt and son-matthew to see if acceptance, will move forward on trying to obtain insurance auth. Pt due to her carryover does not remember talking about SNF, son reports he and her sister have spoken with her regarding going to another rehab from here. Will work on [employer] fast streamer and keep team updated regarding plan.  Ref number [id number]  11:51 AM Spoke with [employer] who has approved pt for SNF. Auth 5/30-6/1. Have reached out to kelly-Admissions to confirm bed tomorrow and will work on discharge tomorrow-await confirmation from [place]  12:07 PM Claiborne Billings reports bed for tomorrow no COVID test needed. Work on Quarry manager. Pt and son aware and pleased with this plan.  Ref [id number] auth from 5/30-6/1 CM to follow is [facility].

## 2022-02-09 DIAGNOSIS — S22088A Other fracture of T11-T12 vertebra, initial encounter for closed fracture: Secondary | ICD-10-CM | POA: Diagnosis not present

## 2022-02-09 DIAGNOSIS — S2242XD Multiple fractures of ribs, left side, subsequent encounter for fracture with routine healing: Secondary | ICD-10-CM | POA: Diagnosis not present

## 2022-02-09 DIAGNOSIS — S066X0A Traumatic subarachnoid hemorrhage without loss of consciousness, initial encounter: Secondary | ICD-10-CM | POA: Diagnosis present

## 2022-02-09 DIAGNOSIS — Z853 Personal history of malignant neoplasm of breast: Secondary | ICD-10-CM | POA: Diagnosis not present

## 2022-02-09 DIAGNOSIS — I629 Nontraumatic intracranial hemorrhage, unspecified: Secondary | ICD-10-CM | POA: Diagnosis not present

## 2022-02-09 DIAGNOSIS — N281 Cyst of kidney, acquired: Secondary | ICD-10-CM | POA: Diagnosis not present

## 2022-02-09 DIAGNOSIS — M6281 Muscle weakness (generalized): Secondary | ICD-10-CM | POA: Diagnosis not present

## 2022-02-09 DIAGNOSIS — E871 Hypo-osmolality and hyponatremia: Secondary | ICD-10-CM | POA: Diagnosis not present

## 2022-02-09 DIAGNOSIS — S32018A Other fracture of first lumbar vertebra, initial encounter for closed fracture: Secondary | ICD-10-CM | POA: Diagnosis not present

## 2022-02-09 DIAGNOSIS — D649 Anemia, unspecified: Secondary | ICD-10-CM | POA: Diagnosis not present

## 2022-02-09 DIAGNOSIS — W010XXA Fall on same level from slipping, tripping and stumbling without subsequent striking against object, initial encounter: Secondary | ICD-10-CM | POA: Diagnosis present

## 2022-02-09 DIAGNOSIS — W19XXXA Unspecified fall, initial encounter: Secondary | ICD-10-CM | POA: Diagnosis not present

## 2022-02-09 DIAGNOSIS — Z9851 Tubal ligation status: Secondary | ICD-10-CM | POA: Diagnosis not present

## 2022-02-09 DIAGNOSIS — D62 Acute posthemorrhagic anemia: Secondary | ICD-10-CM | POA: Diagnosis not present

## 2022-02-09 DIAGNOSIS — R569 Unspecified convulsions: Secondary | ICD-10-CM | POA: Diagnosis present

## 2022-02-09 DIAGNOSIS — R410 Disorientation, unspecified: Secondary | ICD-10-CM | POA: Diagnosis not present

## 2022-02-09 DIAGNOSIS — M810 Age-related osteoporosis without current pathological fracture: Secondary | ICD-10-CM | POA: Diagnosis present

## 2022-02-09 DIAGNOSIS — S0181XD Laceration without foreign body of other part of head, subsequent encounter: Secondary | ICD-10-CM | POA: Diagnosis not present

## 2022-02-09 DIAGNOSIS — Z79899 Other long term (current) drug therapy: Secondary | ICD-10-CM | POA: Diagnosis not present

## 2022-02-09 DIAGNOSIS — I700 Atherosclerosis of aorta: Secondary | ICD-10-CM | POA: Diagnosis not present

## 2022-02-09 DIAGNOSIS — S2232XA Fracture of one rib, left side, initial encounter for closed fracture: Secondary | ICD-10-CM | POA: Diagnosis present

## 2022-02-09 DIAGNOSIS — K8689 Other specified diseases of pancreas: Secondary | ICD-10-CM | POA: Diagnosis not present

## 2022-02-09 DIAGNOSIS — S065X0D Traumatic subdural hemorrhage without loss of consciousness, subsequent encounter: Secondary | ICD-10-CM | POA: Diagnosis not present

## 2022-02-09 DIAGNOSIS — S01511A Laceration without foreign body of lip, initial encounter: Secondary | ICD-10-CM | POA: Diagnosis present

## 2022-02-09 DIAGNOSIS — Z823 Family history of stroke: Secondary | ICD-10-CM | POA: Diagnosis not present

## 2022-02-09 DIAGNOSIS — R531 Weakness: Secondary | ICD-10-CM | POA: Diagnosis not present

## 2022-02-09 DIAGNOSIS — N390 Urinary tract infection, site not specified: Secondary | ICD-10-CM | POA: Diagnosis not present

## 2022-02-09 DIAGNOSIS — Z8249 Family history of ischemic heart disease and other diseases of the circulatory system: Secondary | ICD-10-CM | POA: Diagnosis not present

## 2022-02-09 DIAGNOSIS — S06360A Traumatic hemorrhage of cerebrum, unspecified, without loss of consciousness, initial encounter: Secondary | ICD-10-CM | POA: Diagnosis not present

## 2022-02-09 DIAGNOSIS — M47812 Spondylosis without myelopathy or radiculopathy, cervical region: Secondary | ICD-10-CM | POA: Diagnosis not present

## 2022-02-09 DIAGNOSIS — F32A Depression, unspecified: Secondary | ICD-10-CM | POA: Diagnosis not present

## 2022-02-09 DIAGNOSIS — Z880 Allergy status to penicillin: Secondary | ICD-10-CM | POA: Diagnosis not present

## 2022-02-09 DIAGNOSIS — S32029A Unspecified fracture of second lumbar vertebra, initial encounter for closed fracture: Secondary | ICD-10-CM | POA: Diagnosis present

## 2022-02-09 DIAGNOSIS — Z91038 Other insect allergy status: Secondary | ICD-10-CM | POA: Diagnosis not present

## 2022-02-09 DIAGNOSIS — S0093XA Contusion of unspecified part of head, initial encounter: Secondary | ICD-10-CM | POA: Diagnosis not present

## 2022-02-09 DIAGNOSIS — S32039A Unspecified fracture of third lumbar vertebra, initial encounter for closed fracture: Secondary | ICD-10-CM | POA: Diagnosis present

## 2022-02-09 DIAGNOSIS — S069XAA Unspecified intracranial injury with loss of consciousness status unknown, initial encounter: Secondary | ICD-10-CM | POA: Diagnosis not present

## 2022-02-09 DIAGNOSIS — Z86010 Personal history of colonic polyps: Secondary | ICD-10-CM | POA: Diagnosis not present

## 2022-02-09 DIAGNOSIS — Z885 Allergy status to narcotic agent status: Secondary | ICD-10-CM | POA: Diagnosis not present

## 2022-02-09 DIAGNOSIS — Z8669 Personal history of other diseases of the nervous system and sense organs: Secondary | ICD-10-CM | POA: Diagnosis not present

## 2022-02-09 DIAGNOSIS — R414 Neurologic neglect syndrome: Secondary | ICD-10-CM | POA: Diagnosis not present

## 2022-02-09 DIAGNOSIS — S065X0A Traumatic subdural hemorrhage without loss of consciousness, initial encounter: Secondary | ICD-10-CM | POA: Diagnosis present

## 2022-02-09 DIAGNOSIS — S32028A Other fracture of second lumbar vertebra, initial encounter for closed fracture: Secondary | ICD-10-CM | POA: Diagnosis not present

## 2022-02-09 DIAGNOSIS — Y9301 Activity, walking, marching and hiking: Secondary | ICD-10-CM | POA: Diagnosis present

## 2022-02-09 DIAGNOSIS — R2689 Other abnormalities of gait and mobility: Secondary | ICD-10-CM | POA: Diagnosis not present

## 2022-02-09 DIAGNOSIS — K5730 Diverticulosis of large intestine without perforation or abscess without bleeding: Secondary | ICD-10-CM | POA: Diagnosis not present

## 2022-02-09 DIAGNOSIS — I611 Nontraumatic intracerebral hemorrhage in hemisphere, cortical: Secondary | ICD-10-CM | POA: Diagnosis not present

## 2022-02-09 DIAGNOSIS — S32049A Unspecified fracture of fourth lumbar vertebra, initial encounter for closed fracture: Secondary | ICD-10-CM | POA: Diagnosis present

## 2022-02-09 DIAGNOSIS — Z888 Allergy status to other drugs, medicaments and biological substances status: Secondary | ICD-10-CM | POA: Diagnosis not present

## 2022-02-09 DIAGNOSIS — M542 Cervicalgia: Secondary | ICD-10-CM | POA: Diagnosis present

## 2022-02-09 DIAGNOSIS — M2550 Pain in unspecified joint: Secondary | ICD-10-CM | POA: Diagnosis not present

## 2022-02-09 DIAGNOSIS — I10 Essential (primary) hypertension: Secondary | ICD-10-CM | POA: Diagnosis present

## 2022-02-09 DIAGNOSIS — Y92098 Other place in other non-institutional residence as the place of occurrence of the external cause: Secondary | ICD-10-CM | POA: Diagnosis not present

## 2022-02-09 DIAGNOSIS — F331 Major depressive disorder, recurrent, moderate: Secondary | ICD-10-CM | POA: Diagnosis not present

## 2022-02-09 DIAGNOSIS — S270XXA Traumatic pneumothorax, initial encounter: Secondary | ICD-10-CM | POA: Diagnosis not present

## 2022-02-09 DIAGNOSIS — N181 Chronic kidney disease, stage 1: Secondary | ICD-10-CM | POA: Diagnosis not present

## 2022-02-09 DIAGNOSIS — J9811 Atelectasis: Secondary | ICD-10-CM | POA: Diagnosis not present

## 2022-02-09 DIAGNOSIS — S3991XA Unspecified injury of abdomen, initial encounter: Secondary | ICD-10-CM | POA: Diagnosis not present

## 2022-02-09 DIAGNOSIS — K635 Polyp of colon: Secondary | ICD-10-CM | POA: Diagnosis not present

## 2022-02-09 DIAGNOSIS — K219 Gastro-esophageal reflux disease without esophagitis: Secondary | ICD-10-CM | POA: Diagnosis present

## 2022-02-09 DIAGNOSIS — R2243 Localized swelling, mass and lump, lower limb, bilateral: Secondary | ICD-10-CM | POA: Diagnosis not present

## 2022-02-09 DIAGNOSIS — I69354 Hemiplegia and hemiparesis following cerebral infarction affecting left non-dominant side: Secondary | ICD-10-CM | POA: Diagnosis not present

## 2022-02-09 DIAGNOSIS — I619 Nontraumatic intracerebral hemorrhage, unspecified: Secondary | ICD-10-CM | POA: Diagnosis not present

## 2022-02-09 DIAGNOSIS — K5900 Constipation, unspecified: Secondary | ICD-10-CM | POA: Diagnosis not present

## 2022-02-09 DIAGNOSIS — S2242XA Multiple fractures of ribs, left side, initial encounter for closed fracture: Secondary | ICD-10-CM | POA: Diagnosis not present

## 2022-02-09 DIAGNOSIS — Z9181 History of falling: Secondary | ICD-10-CM | POA: Diagnosis not present

## 2022-02-09 DIAGNOSIS — S32020S Wedge compression fracture of second lumbar vertebra, sequela: Secondary | ICD-10-CM | POA: Diagnosis not present

## 2022-02-09 DIAGNOSIS — S0993XA Unspecified injury of face, initial encounter: Secondary | ICD-10-CM | POA: Diagnosis not present

## 2022-02-09 DIAGNOSIS — R252 Cramp and spasm: Secondary | ICD-10-CM | POA: Diagnosis not present

## 2022-02-09 DIAGNOSIS — Z7401 Bed confinement status: Secondary | ICD-10-CM | POA: Diagnosis not present

## 2022-02-09 NOTE — Progress Notes (Signed)
PROGRESS NOTE   Subjective/Complaints:  DC to SNF today. She reports she is going well. Tightness in L foot is improved.    Review of Systems  Constitutional: Negative.  Negative for chills and fever.  HENT: Negative.    Respiratory:  Negative for shortness of breath.   Cardiovascular:  Negative for chest pain and palpitations.  Gastrointestinal:  Negative for constipation, diarrhea, nausea and vomiting.  Genitourinary:  Positive for frequency and urgency. Negative for dysuria.  Musculoskeletal:  Negative for falls.  Skin: Negative.   Neurological:  Positive for focal weakness.  Psychiatric/Behavioral:  Negative for depression.     Objective:   No results found. Recent Labs    02/08/22 0618  WBC 5.3  HGB 11.9*  HCT 35.4*  PLT 262    Recent Labs    02/08/22 0618  NA 134*  K 4.4  CL 98  CO2 31  GLUCOSE 90  BUN 9  CREATININE 0.76  CALCIUM 9.2     Intake/Output Summary (Last 24 hours) at 02/09/2022 0806 Last data filed at 02/08/2022 1841 Gross per 24 hour  Intake 656 ml  Output --  Net 656 ml         Physical Exam: Vital Signs Blood pressure 110/61, pulse 80, temperature 98.7 F (37.1 C), temperature source Oral, resp. rate 17, height '5\' 6"'$  (1.676 m), weight 59.6 kg, SpO2 95 %.    Assessment/Plan: 1. Functional deficits which require 3+ hours per day of interdisciplinary therapy in a comprehensive inpatient rehab setting. Physiatrist is providing close team supervision and 24 hour management of active medical problems listed below. Physiatrist and rehab team continue to assess barriers to discharge/monitor patient progress toward functional and medical goals  Care Tool:  Bathing    Body parts bathed by patient: Right arm, Left arm, Chest, Abdomen, Front perineal area, Right upper leg, Left upper leg, Face, Left lower leg, Right lower leg, Buttocks   Body parts bathed by helper: Right lower leg,  Left lower leg, Buttocks     Bathing assist Assist Level: Contact Guard/Touching assist     Upper Body Dressing/Undressing Upper body dressing   What is the patient wearing?: Bra, Pull over shirt    Upper body assist Assist Level: Minimal Assistance - Patient > 75% (set up shirt, min bra)    Lower Body Dressing/Undressing Lower body dressing      What is the patient wearing?: Underwear/pull up, Pants     Lower body assist Assist for lower body dressing: Contact Guard/Touching assist     Toileting Toileting    Toileting assist Assist for toileting: Moderate Assistance - Patient 50 - 74%     Transfers Chair/bed transfer  Transfers assist     Chair/bed transfer assist level: Minimal Assistance - Patient > 75%     Locomotion Ambulation   Ambulation assist      Assist level: 2 helpers Assistive device: Hand held assist Max distance: 200 ft   Walk 10 feet activity   Assist  Walk 10 feet activity did not occur: Safety/medical concerns  Assist level: 2 helpers Assistive device: Hand held assist   Walk 50 feet activity   Assist Walk  50 feet with 2 turns activity did not occur: Safety/medical concerns  Assist level: 2 helpers Assistive device: Hand held assist    Walk 150 feet activity   Assist Walk 150 feet activity did not occur: Safety/medical concerns  Assist level: 2 helpers Assistive device: Hand held assist    Walk 10 feet on uneven surface  activity   Assist Walk 10 feet on uneven surfaces activity did not occur: Safety/medical concerns         Wheelchair     Assist Is the patient using a wheelchair?: Yes Type of Wheelchair: Manual    Wheelchair assist level: Total Assistance - Patient < 25% Max wheelchair distance: 150    Wheelchair 50 feet with 2 turns activity    Assist        Assist Level: Total Assistance - Patient < 25%   Wheelchair 150 feet activity     Assist      Assist Level: Total  Assistance - Patient < 25%   Blood pressure 110/61, pulse 80, temperature 98.7 F (37.1 C), temperature source Oral, resp. rate 17, height '5\' 6"'$  (1.676 m), weight 59.6 kg, SpO2 95 %.   General: Alert, No apparent distress.  Walking with therapy HEENT: Head is normocephalic, EOMI, PERRLA, oral mucosa pink and moist Neck: Supple Heart:  No murmurs or extra heart sounds. Chest:  normal effort, non-labored Abdomen: Soft, non-tender, non-distended, bowel sounds positive. Extremities: No clubbing, cyanosis, or edema. Psych: normal affect. Pleasant. Some impulsivity at times. Skin: warm and dry Neuro:  Alert and oriented x3.  Still has neglect of left side-continues to improve CN II-X12 grossly intact.  Sensation intact x4. Follows commands Musculoskeletal:  Increased tone in LUE elbow flexors and LLE hip adductors and hip and knee extensors, increased tone in ankle plantarflexors on Left - a little increased this AM RUE and RLE 5/5 4/5 in LUE L HF 4-/5, Knee extension 4+/5, Ankle FP/DF 4-/5  Medical Problem List and Plan: 1. Functional deficits secondary to intraparenchymal hematoma in the right rontal lobe             -patient may  shower             -ELOS/Goals: 12-14 days min A CGA, expected dc 5/31,              -Continue CIR for PT, OT and SLP  -CT head after fall 5/15 without acute changes, discussed fall prevention -increased tone in RLE especially plantarflexion of ankle interfering with gait, this is improving with medication and stretching -DC to SNF today 2.  Antithrombotics: -DVT/anticoagulation:  Mechanical: Antiembolism stockings, thigh (TED hose) Bilateral lower extremities             No Lovenoxetc due to IPH             -antiplatelet therapy: N/A 3. Pain Management: 4. Mood: Effexor 75 mg daily             -antipsychotic agents: N/A  -5/24 reports she continues to have good mood overall, continue Effexor 5. Neuropsych: This patient is capable of making decisions on  her own behalf. 6. Skin/Wound Care: Routine skin check 7. Fluids/Electrolytes/Nutrition: Routine in and out with follow-up chemistries  -5/11 midline removed yesterday 8.  Seizure disorder- new onset since IPH-.  Keppra 1000 mg every 12 hours  -5/26 No additional seizure activity since at CIR 9.  Hypertension.  Cozaar 100 mg daily.  Monitor with increased mobility           -  5/25 BP well controlled, continue cozaar '100mg'$  dialy          5/29 BP well controlled overall continue current regimen    02/09/2022    5:22 AM 02/09/2022    4:30 AM 02/08/2022    7:51 PM  Vitals with BMI  Weight 131 lbs 6 oz    BMI 69.48    Systolic  546 270  Diastolic  61 55  Pulse  80 97    10.  History of left breast cancer with radioactive seed localization 08/28/2020.  Follow-up outpatient 11. Hx of R frontal ICH 2017 12. Poor appetite- might need supplements or appetite stimulants  -5/10 albumin a little low, encourage PO intake             -5/29 She is eating most of her meals, ate 100 percent of breakfast follow 13. Diarrhea , improved  5/23 Normal BM this AM, follow              5/27 LBM 5/25 normal, CTM              5/28 LBM on 02/05/22 14. UTI     -UA on 5/18: + leuks, + nitrite, many bacteria, culture pending.      -Pt started on Bactrim DS on 01/28/22.       -5/22 no dysuria this AM, culture shows ecoli that was sensitive to bactrim, continue to monitor  -5/29 No symptoms of UTI this morning, continue to follow  Repeat UA ordered for increased frequency to confirm resolution 15. Spasticity, increased tone in LLE particularly with ankle plantarflexors  -5/23 will order dorsiflexion night splint  -5/23 Tizanidine QHS started  -5/25 Increase Tizanidine to '2mg'$  BID, tone has improved with medication  -5/26 Continue tizanidine current dose              -5/28 Continue 2 mg tizanidine BID  -5/30 tizanidine increased to '4mg'$  Qhs and '2mg'$  Qam  -5/31 LLE tone improved, continue current medications 16.  Constipation  5/26 Pt reports she had BM yesterday, do not see this documented, Miralax PRN           5/27 LBM 5/25, continue to use prn's as needed.           5/31 BM yesterday, denies constipation currently, continue to follow   LOS: 21 days A FACE TO [place] 02/09/2022, 8:06 AM

## 2022-02-09 NOTE — Progress Notes (Signed)
Physical Therapy Discharge Summary  Patient Details  Name: Cynthia [name] MRN: [id number] Date of Birth: 02-Oct-1945  Today's Date: 02/09/2022       Patient has met 6 of 10 long term goals due to improved activity tolerance, improved balance, improved postural control, increased strength, ability to compensate for deficits, improved attention, improved awareness, and improved coordination.  Patient to discharge at an ambulatory level [place].   Patient's care partner requires assistance to provide the necessary physical and cognitive assistance at discharge. Plan for d/c to SNF to continue skilled therapies to reduce burden of care.  Reasons goals not met: Due to increase in pt's ambulation tolerate w/c mobility had been deferred as pt is able to ambulate ~277f with RW and MinA. As pt will d/c to SNF car transfers and stair training was deferred to focus on gait and functional transfers. Stair training was initiated with pt however was not able to progress past modA during pt's stay.   Recommendation:  Patient will benefit from ongoing skilled PT services in skilled nursing facility setting to continue to advance safe functional mobility, address ongoing impairments in balance/postural control, L lower extremity extensor tone, activity tolerance, functional mobility, gait and stair training, motor planning, safety awareness, patient/caregiver education, and minimize fall risk.  Equipment: No equipment provided, equipment to be provided at next level of care.  Reasons for discharge: treatment goals met, discharge from hospital, and family unable to provide the recommended level of assist at d/c at this time.  Patient/family agrees with progress made and goals achieved: Yes  PT Discharge Precautions/Restrictions Precautions Precautions: Fall Precaution Comments: L LE extensor tone Restrictions Weight Bearing Restrictions: No Pain Pain Assessment Pain Scale: 0-10 Pain Score: 0-No  pain Faces Pain Scale: No hurt Pain Interference Pain Interference Pain Effect on Sleep: 0. Does not apply - I have not had any pain or hurting in the past 5 days Pain Interference with Therapy Activities: 0. Does not apply - I have not received rehabilitationtherapy in the past 5 days Pain Interference with Day-to-Day Activities: 1. Rarely or not at all Vision/Perception  Vision - History Ability to See in Adequate Light: 0 Adequate  Cognition Overall Cognitive Status: Impaired/Different from baseline Arousal/Alertness: Awake/alert Orientation Level: Oriented X4 Year: 2023 Month: May Attention: Selective;Alternating Selective Attention: Appears intact Memory: Impaired Sensation Sensation Light Touch: Appears Intact Hot/Cold: Not tested Proprioception: Appears Intact Stereognosis: Appears Intact Coordination Gross Motor Movements are Fluid and Coordinated: No Fine Motor Movements are Fluid and Coordinated: No Heel Shin Test: not fluid but able to perform up to knee Motor  Motor Motor: Hemiplegia;Abnormal tone;Abnormal postural alignment and control Motor - Discharge Observations: LLE extensor tone,  L ankle PF contracture  Mobility Bed Mobility Bed Mobility: Rolling Right;Rolling Left;Supine to Sit;Sit to Supine Rolling Right: Supervision/verbal cueing Rolling Left: Supervision/Verbal cueing Supine to Sit: Supervision/Verbal cueing Sit to Supine: Supervision/Verbal cueing Transfers Transfers: Sit to Stand;Stand to Sit;Stand Pivot Transfers Sit to Stand: Contact Guard/Touching assist Stand to Sit: Contact Guard/Touching assist Stand Pivot Transfers: Minimal Assistance - Patient > 75% Transfer (Assistive device): Rolling walker Locomotion  Gait Ambulation: Yes Gait Assistance: Minimal Assistance - Patient > 75% Gait Distance (Feet): 200 Feet Assistive device: Rolling walker Gait Assistance Details: Verbal cues for safe use of DME/AE;Verbal cues for gait  pattern;Verbal cues for technique Gait Gait: Yes Gait Pattern: Impaired Gait Pattern: Step-through pattern;Decreased hip/knee flexion - left;Decreased dorsiflexion - left;Decreased weight shift to left;Decreased trunk rotation;Narrow base of support Gait velocity: reduced Stairs /  Additional Locomotion Stairs: Yes Stairs Assistance: Minimal Assistance - Patient > 75%;Moderate Assistance - Patient 50 - 74% Stair Management Technique: Two rails Number of Stairs: 4 Height of Stairs: 6 Pick up small object from the floor (from standing position) activity did not occur: Safety/medical concerns  Trunk/Postural Assessment  Cervical Assessment Cervical Assessment: Within Functional Limits Thoracic Assessment Thoracic Assessment: Within Functional Limits Lumbar Assessment Lumbar Assessment: Exceptions to [facility] (posterior pelvic tilt) Postural Control Postural Control: Deficits on evaluation Righting Reactions: delayed but improved Protective Responses: detlayed but improved  Balance Balance Balance Assessed: Yes Static Sitting Balance Static Sitting - Balance Support: Feet supported Static Sitting - Level of Assistance: 5: Stand by assistance Dynamic Sitting Balance Dynamic Sitting - Balance Support: During functional activity Dynamic Sitting - Level of Assistance: 4: Min assist Sitting balance - Comments: Improved righting reactions Static Standing Balance Static Standing - Balance Support: Bilateral upper extremity supported Static Standing - Level of Assistance: 4: Min assist Dynamic Standing Balance Dynamic Standing - Balance Support: Bilateral upper extremity supported;During functional activity Dynamic Standing - Level of Assistance: 4: Min assist;3: Mod assist Extremity Assessment  RLE Assessment RLE Assessment: Within Functional Limits General Strength Comments: Grossly 4+/5 RLE Tone RLE Tone: Within Functional Limits LLE Assessment LLE Assessment: Exceptions to [facility] LLE  Strength Left Hip Flexion: 3+/5 Left Hip ABduction: 3+/5 Left Hip ADduction: 3+/5 Left Knee Flexion: 3/5 Left Knee Extension: 3-/5 Left Ankle Dorsiflexion: 2+/5 Left Ankle Plantar Flexion: 2+/5 LLE Tone LLE Tone: Moderate    Rosita DeChalus PT, DPT  02/09/2022, 12:51 PM

## 2022-02-09 NOTE — Progress Notes (Signed)
Speech Language Pathology Discharge Summary  Patient Details  Name: Cynthia [name] MRN: [id number] Date of Birth: 1946/02/11  Patient has met 1 of 4 long term goals.  Patient to discharge at overall Min;Mod level.  Reasons goals not met: d/c earlier than expected near goals but slow progress   Clinical Impression/Discharge Summary:   Pt was nearing goal level, but making slow progress. SLP facilitated selective attention (due to internal distractions), basic-mildly complex problem solving, emergent awareness and recall with external aids. Pt participated in use of memory notebook, demonstrating increase in daily recall, increased error awareness (min-mod A), basic problem solving (min-supervision A) and selective attention (min A.) Pt demonstrated initial poor awareness and carry over, however towards the end of her stay on CIR pt demonstrated progress in functional skills. Near discharge pt scored 23/30 on SLUMS (n=>27), 4 points higher than on evaluation. Pt's barriers at d/c are safety awareness, emergent awareness, basic-mildly complex problem solving, recall and selective attention. Education was completed with pt only. Pt benefited from skilled ST services in order to maximize functional independence and reduce burden of care, 24 hour supervision at discharge with continued skilled ST services.  Care Partner:  Caregiver Able to Provide Assistance: Yes  Type of Caregiver Assistance: Physical;Cognitive  Recommendation:  Skilled Nursing facility;24 hour supervision/assistance  Rationale for SLP Follow Up: Maximize functional communication;Maximize cognitive function and independence;Reduce caregiver burden   Equipment: N/A   Reasons for discharge: Discharged from hospital   Patient/Family Agrees with Progress Made and Goals Achieved: Yes    Amazing Cowman 02/09/2022, 1:55 PM

## 2022-02-09 NOTE — Progress Notes (Signed)
Physical Therapy Session Note  Patient Details  Name: Jordan Caraveo MRN: [id number] Date of Birth: July 17, 1946  Today's Date: 02/09/2022 PT Individual Time: 0805-0920 PT Individual Time Calculation (min): 75 min   Short Term Goals: Week 3:  PT Short Term Goal 1 (Week 3): STG=LTG due to ELOS.  Skilled Therapeutic Interventions/Progress Updates: Pt presented in bed agreeable to therapy. Pt denies pain during session. Session focused on transfers, and overall functional mobility in preparation for d/c. Pt requesting to go to sink to use mouthwash however pt noted to be incontinent of urine therefore pt agreeable to go to toilet first. Pt performed supine to sit at EOB with supervision, use of bed features and increased time. Pt did require cues for anterior shifting of L hip to align with EOB. PTA performed stretching of heel cord and hamstring 30 sec x 3 prior to OOB mobility. PTA donned shoes total A for time management and pt performed Sit to stand with CGA and RW and ambulated minA ~17f to toilet. Pt did require cues for negotiation of objects on L. Pt was maxA for removing brief and CGA for toilet transfers. Pt was able to have additional continent void on toilet and was provided with wet washcloths as pt performed peri-care with supervision and was able to clean BLE with supervision. PTA donned fresh brief and pants total A with pt performing Sit to stand CGA with PTA pulling pants and brief over hips. Pt then ambulated minA to w/c and performed oral care and applied deodorant with set up. Pt then transported to day room and performed ambulatory transfer to high/low mat with minA for sequencing. Pt participated in ball toss and shoulder flexion with chest press with 1Kg weighted ball for dynamic balance and functional reaching. PTA applied ace bandage to LLE and pt ambulated ~1058fwith RW and minA with PTA stabilizing hip to decrease excessive rotation and pt improving to CGA. Pt returned to w/c  and transported back to room total A for time management. Pt left in w/c at end of session with belt alarm on, call bell within reach and needs met.      Therapy Documentation Precautions:  Precautions Precautions: Fall Precaution Comments: L LE extensor tone Restrictions Weight Bearing Restrictions: No Other Position/Activity Restrictions: tendency to stand on toes on L foot due to extension tone/L ankle contracture General:   Vital Signs:   Pain: Pain Assessment Pain Scale: 0-10 Pain Score: 0-No pain Faces Pain Scale: No hurt Mobility: Bed Mobility Bed Mobility: Rolling Right;Rolling Left;Supine to Sit;Sit to Supine Rolling Right: Supervision/verbal cueing Rolling Left: Supervision/Verbal cueing Supine to Sit: Supervision/Verbal cueing Sit to Supine: Supervision/Verbal cueing Transfers Transfers: Sit to Stand;Stand to Sit;Stand Pivot Transfers Sit to Stand: Contact Guard/Touching assist Stand to Sit: Contact Guard/Touching assist Stand Pivot Transfers: Minimal Assistance - Patient > 75% Transfer (Assistive device): Rolling walker Locomotion :    Trunk/Postural Assessment : Cervical Assessment Cervical Assessment: Within Functional Limits Thoracic Assessment Thoracic Assessment: Within Functional Limits Lumbar Assessment Lumbar Assessment: Exceptions to [facility]) Postural Control Postural Control: Deficits on evaluation Righting Reactions: delayed but improved Protective Responses: detlayed but improved  Balance: Balance Balance Assessed: Yes Static Sitting Balance Static Sitting - Balance Support: Feet supported Static Sitting - Level of Assistance: 5: Stand by assistance Dynamic Sitting Balance Dynamic Sitting - Balance Support: During functional activity Dynamic Sitting - Level of Assistance: 4: Min assist Sitting balance - Comments: Improved righting reactions Static Standing Balance Static Standing - Balance  Support: Bilateral upper  extremity supported Static Standing - Level of Assistance: 4: Min assist Dynamic Standing Balance Dynamic Standing - Balance Support: Bilateral upper extremity supported;During functional activity Dynamic Standing - Level of Assistance: 4: Min assist;3: Mod assist Exercises:   Other Treatments:      Therapy/Group: Individual Therapy  Kunal Levario 02/09/2022, 12:37 PM

## 2022-02-09 NOTE — Progress Notes (Signed)
Inpatient Rehabilitation Care Coordinator Discharge Note   Patient Details  Name: Cynthia [name] MRN: [id number] Date of Birth: [birth year]   Discharge location: [facility]  Length of Stay: 22 DAYS  Discharge activity level: MIN LEVEL  Home/community participation: ACTIVE  Patient response OM:VEHMCN Literacy - How often do you need to have someone help you when you read instructions, pamphlets, or other written material from your doctor or pharmacy?: Never  Patient response OB:SJGGEZ Isolation - How often do you feel lonely or isolated from those around you?: Rarely  Services provided included: RD, MD, PT, OT, SLP, RN, CM, TR, Pharmacy, SW  Financial Services:  Financial Services Utilized: Private Insurance [employer] MEDICARE  Choices offered to/list presented to: PT AND SON  Follow-up services arranged:  Other (Comment) (SNF)           Patient response to transportation need: Is the patient able to respond to transportation needs?: Yes In the past 12 months, has lack of transportation kept you from medical appointments or from getting medications?: No In the past 12 months, has lack of transportation kept you from meetings, work, or from getting things needed for daily living?: No    Comments (or additional information): PT [place]. FAMILY AWARE WILL BE SHORT TERM AND SHE STILL WILL REQUIRE 24/7 SUPERVISION DUE TO MEMORY ISSUES.  Patient/Family verbalized understanding of follow-up arrangements:  Yes  Individual responsible for coordination of the follow-up plan: MATTHEW-SON [phone]  Confirmed correct DME delivered: Elease Hashimoto 02/09/2022    Cynthia [name], [place]

## 2022-02-10 NOTE — Progress Notes (Signed)
Occupational Therapy Discharge Summary  Patient Details  Name: Cynthia [name] MRN: [id number] Date of Birth: Sep 29, 1945     Patient has met 10 of 10 long term goals due to improved balance, postural control, ability to compensate for deficits, and improved coordination.    Patient to discharge at overall CGA to [place] level.  Patient's care partner unavailable to provide the necessary physical and cognitive assistance at discharge.  Pt made excellent progress with her mobility but needs 24/7 supervision due to impaired awareness, perceptual deficits, memory.  No family education.  Pt will not be able to discharge home with family at this time.  Reasons goals not met: n/a  Recommendation:  Patient will benefit from ongoing skilled OT services in skilled nursing facility setting to continue to advance functional skills in the area of BADL and Reduce care partner burden.  Equipment: No equipment provided  Reasons for discharge: treatment goals met  Patient/family agrees with progress made and goals achieved: Yes  OT Discharge Precautions/Restrictions  Precautions Precautions: Fall Precaution Comments: L LE extensor tone Restrictions Weight Bearing Restrictions: No   ADL ADL Eating: Set up Where Assessed-Eating: Wheelchair Grooming: Setup Where Assessed-Grooming: Wheelchair Upper Body Bathing: Supervision/safety Where Assessed-Upper Body Bathing: Shower Lower Body Bathing: Contact guard Where Assessed-Lower Body Bathing: Shower Upper Body Dressing: Contact guard Where Assessed-Upper Body Dressing: Wheelchair Lower Body Dressing: Contact guard Where Assessed-Lower Body Dressing: Wheelchair Toileting: Contact guard Where Assessed-Toileting: Editor, commissioning Method: Ambulating, Stand pivot Science writer: Raised toilet seat Tub/Shower Transfer: Unable to assess Tub/Shower Transfer Method: Unable to assess Press photographer: Curator Method: Ambulating, Stand pivot Youth worker: Gaffer Baseline Vision/History: 1 Wears glasses Patient Visual Report: No change from baseline Eye Alignment: Within Functional Limits Ocular Range of Motion: Within Functional Limits Alignment/Gaze Preference: Within Defined Limits Tracking/Visual Pursuits: Decreased smoothness of horizontal tracking;Decreased smoothness of vertical tracking Visual Fields: Left visual field deficit Perception  Perception: Impaired Inattention/Neglect: Does not attend to left visual field;Does not attend to left side of body Praxis Praxis: Impaired Praxis Impairment Details: Motor planning;Initiation;Perseveration Cognition Cognition Overall Cognitive Status: Impaired/Different from baseline Arousal/Alertness: Awake/alert Orientation Level: Person;Place;Situation Person: Oriented Place: Oriented Situation: Oriented Memory: Impaired Memory Impairment: Retrieval deficit;Decreased recall of new information Selective Attention: Impaired Selective Attention Impairment: Verbal basic;Functional basic Awareness: Impaired Awareness Impairment: Emergent impairment Problem Solving: Impaired Problem Solving Impairment: Verbal complex;Functional complex;Verbal basic;Functional basic Safety/Judgment: Impaired Brief Interview for Mental Status (BIMS) Repetition of Three Words (First Attempt): 3 Temporal Orientation: Year: Correct Temporal Orientation: Month: Accurate within 5 days Temporal Orientation: Day: Correct Recall: "Sock": Yes, no cue required Recall: "Blue": Yes, no cue required Recall: "Bed": Yes, no cue required BIMS Summary Score: 15 Sensation Sensation Light Touch: Appears Intact Hot/Cold: Appears Intact Proprioception: Appears Intact Stereognosis: Appears Intact Coordination Gross Motor Movements are Fluid and Coordinated: No Fine Motor Movements are  Fluid and Coordinated: No Finger Nose Finger Test: LUE mild dysmetria, RUE WFL Motor  Motor Motor: Hemiplegia;Abnormal tone;Abnormal postural alignment and control Motor - Discharge Observations: LLE extensor tone,  L ankle PF contracture Trunk/Postural Assessment  Postural Control Righting Reactions: delayed but improved Protective Responses: detlayed but improved  Balance Static Sitting Balance Static Sitting - Level of Assistance: 5: Stand by assistance Dynamic Sitting Balance Dynamic Sitting - Level of Assistance: 4: Min assist Static Standing Balance Static Standing - Level of Assistance: 4: Min assist Dynamic Standing Balance Dynamic Standing - Level  of Assistance: 4: Min assist;3: Mod assist Extremity/Trunk Assessment RUE Assessment RUE Assessment: Within Functional Limits LUE Assessment LUE Assessment: Within Functional Limits General Strength Comments: pt has full strength but impaired motor planning and awareness therefore does not use arm in a functionall manner. AROM and strength WFL.   [place] 02/10/2022, 4:01 PM

## 2022-02-15 DIAGNOSIS — I629 Nontraumatic intracranial hemorrhage, unspecified: Secondary | ICD-10-CM | POA: Diagnosis not present

## 2022-02-15 DIAGNOSIS — I10 Essential (primary) hypertension: Secondary | ICD-10-CM | POA: Diagnosis not present

## 2022-02-15 DIAGNOSIS — R531 Weakness: Secondary | ICD-10-CM | POA: Diagnosis not present

## 2022-02-21 DIAGNOSIS — W19XXXA Unspecified fall, initial encounter: Secondary | ICD-10-CM | POA: Diagnosis not present

## 2022-02-21 DIAGNOSIS — Z9181 History of falling: Secondary | ICD-10-CM | POA: Diagnosis not present

## 2022-02-21 DIAGNOSIS — R2689 Other abnormalities of gait and mobility: Secondary | ICD-10-CM | POA: Diagnosis not present

## 2022-02-21 DIAGNOSIS — M6281 Muscle weakness (generalized): Secondary | ICD-10-CM | POA: Diagnosis not present

## 2022-02-21 DIAGNOSIS — R410 Disorientation, unspecified: Secondary | ICD-10-CM | POA: Diagnosis not present

## 2022-02-22 ENCOUNTER — Encounter: Payer: Medicare PPO | Attending: Physical Medicine & Rehabilitation | Admitting: Physical Medicine & Rehabilitation

## 2022-02-23 DIAGNOSIS — N390 Urinary tract infection, site not specified: Secondary | ICD-10-CM | POA: Diagnosis not present

## 2022-02-23 DIAGNOSIS — D649 Anemia, unspecified: Secondary | ICD-10-CM | POA: Diagnosis not present

## 2022-02-23 DIAGNOSIS — N181 Chronic kidney disease, stage 1: Secondary | ICD-10-CM | POA: Diagnosis not present

## 2022-03-02 DIAGNOSIS — F331 Major depressive disorder, recurrent, moderate: Secondary | ICD-10-CM | POA: Diagnosis not present

## 2022-03-04 ENCOUNTER — Encounter (HOSPITAL_COMMUNITY): Payer: Self-pay

## 2022-03-04 ENCOUNTER — Emergency Department (HOSPITAL_COMMUNITY): Payer: Medicare PPO

## 2022-03-04 ENCOUNTER — Other Ambulatory Visit: Payer: Self-pay

## 2022-03-04 ENCOUNTER — Inpatient Hospital Stay (HOSPITAL_COMMUNITY)
Admission: EM | Admit: 2022-03-04 | Discharge: 2022-03-08 | DRG: 086 | Disposition: A | Discharge status: Skilled Nursing Facility | Payer: Medicare PPO | Source: Skilled Nursing Facility | Attending: Surgery | Admitting: Surgery

## 2022-03-04 DIAGNOSIS — Z9851 Tubal ligation status: Secondary | ICD-10-CM

## 2022-03-04 DIAGNOSIS — M542 Cervicalgia: Secondary | ICD-10-CM | POA: Diagnosis present

## 2022-03-04 DIAGNOSIS — S06360A Traumatic hemorrhage of cerebrum, unspecified, without loss of consciousness, initial encounter: Secondary | ICD-10-CM | POA: Diagnosis not present

## 2022-03-04 DIAGNOSIS — Z888 Allergy status to other drugs, medicaments and biological substances status: Secondary | ICD-10-CM | POA: Diagnosis not present

## 2022-03-04 DIAGNOSIS — Z853 Personal history of malignant neoplasm of breast: Secondary | ICD-10-CM

## 2022-03-04 DIAGNOSIS — Z8249 Family history of ischemic heart disease and other diseases of the circulatory system: Secondary | ICD-10-CM

## 2022-03-04 DIAGNOSIS — K8689 Other specified diseases of pancreas: Secondary | ICD-10-CM | POA: Diagnosis not present

## 2022-03-04 DIAGNOSIS — W19XXXA Unspecified fall, initial encounter: Secondary | ICD-10-CM | POA: Diagnosis not present

## 2022-03-04 DIAGNOSIS — Z79899 Other long term (current) drug therapy: Secondary | ICD-10-CM | POA: Diagnosis not present

## 2022-03-04 DIAGNOSIS — S32029A Unspecified fracture of second lumbar vertebra, initial encounter for closed fracture: Secondary | ICD-10-CM | POA: Diagnosis present

## 2022-03-04 DIAGNOSIS — I619 Nontraumatic intracerebral hemorrhage, unspecified: Secondary | ICD-10-CM | POA: Diagnosis not present

## 2022-03-04 DIAGNOSIS — M810 Age-related osteoporosis without current pathological fracture: Secondary | ICD-10-CM | POA: Diagnosis present

## 2022-03-04 DIAGNOSIS — Z880 Allergy status to penicillin: Secondary | ICD-10-CM

## 2022-03-04 DIAGNOSIS — S066X0A Traumatic subarachnoid hemorrhage without loss of consciousness, initial encounter: Secondary | ICD-10-CM | POA: Diagnosis not present

## 2022-03-04 DIAGNOSIS — S065X0D Traumatic subdural hemorrhage without loss of consciousness, subsequent encounter: Secondary | ICD-10-CM | POA: Diagnosis not present

## 2022-03-04 DIAGNOSIS — K219 Gastro-esophageal reflux disease without esophagitis: Secondary | ICD-10-CM | POA: Diagnosis not present

## 2022-03-04 DIAGNOSIS — W010XXA Fall on same level from slipping, tripping and stumbling without subsequent striking against object, initial encounter: Secondary | ICD-10-CM | POA: Diagnosis present

## 2022-03-04 DIAGNOSIS — I69354 Hemiplegia and hemiparesis following cerebral infarction affecting left non-dominant side: Secondary | ICD-10-CM | POA: Diagnosis not present

## 2022-03-04 DIAGNOSIS — Z91038 Other insect allergy status: Secondary | ICD-10-CM | POA: Diagnosis not present

## 2022-03-04 DIAGNOSIS — S22088A Other fracture of T11-T12 vertebra, initial encounter for closed fracture: Secondary | ICD-10-CM | POA: Diagnosis not present

## 2022-03-04 DIAGNOSIS — Z823 Family history of stroke: Secondary | ICD-10-CM

## 2022-03-04 DIAGNOSIS — S32020S Wedge compression fracture of second lumbar vertebra, sequela: Secondary | ICD-10-CM | POA: Diagnosis not present

## 2022-03-04 DIAGNOSIS — N281 Cyst of kidney, acquired: Secondary | ICD-10-CM | POA: Diagnosis not present

## 2022-03-04 DIAGNOSIS — R569 Unspecified convulsions: Secondary | ICD-10-CM | POA: Diagnosis present

## 2022-03-04 DIAGNOSIS — Y92098 Other place in other non-institutional residence as the place of occurrence of the external cause: Secondary | ICD-10-CM

## 2022-03-04 DIAGNOSIS — S3991XA Unspecified injury of abdomen, initial encounter: Secondary | ICD-10-CM | POA: Diagnosis not present

## 2022-03-04 DIAGNOSIS — S32009A Unspecified fracture of unspecified lumbar vertebra, initial encounter for closed fracture: Secondary | ICD-10-CM

## 2022-03-04 DIAGNOSIS — Z885 Allergy status to narcotic agent status: Secondary | ICD-10-CM | POA: Diagnosis not present

## 2022-03-04 DIAGNOSIS — I10 Essential (primary) hypertension: Secondary | ICD-10-CM | POA: Diagnosis present

## 2022-03-04 DIAGNOSIS — S32049A Unspecified fracture of fourth lumbar vertebra, initial encounter for closed fracture: Secondary | ICD-10-CM | POA: Diagnosis not present

## 2022-03-04 DIAGNOSIS — S065X0A Traumatic subdural hemorrhage without loss of consciousness, initial encounter: Secondary | ICD-10-CM | POA: Diagnosis present

## 2022-03-04 DIAGNOSIS — S0093XA Contusion of unspecified part of head, initial encounter: Secondary | ICD-10-CM | POA: Diagnosis not present

## 2022-03-04 DIAGNOSIS — I700 Atherosclerosis of aorta: Secondary | ICD-10-CM | POA: Diagnosis not present

## 2022-03-04 DIAGNOSIS — M47812 Spondylosis without myelopathy or radiculopathy, cervical region: Secondary | ICD-10-CM | POA: Diagnosis not present

## 2022-03-04 DIAGNOSIS — J9811 Atelectasis: Secondary | ICD-10-CM | POA: Diagnosis not present

## 2022-03-04 DIAGNOSIS — S270XXA Traumatic pneumothorax, initial encounter: Secondary | ICD-10-CM | POA: Diagnosis not present

## 2022-03-04 DIAGNOSIS — S2232XA Fracture of one rib, left side, initial encounter for closed fracture: Secondary | ICD-10-CM | POA: Diagnosis not present

## 2022-03-04 DIAGNOSIS — D62 Acute posthemorrhagic anemia: Secondary | ICD-10-CM | POA: Diagnosis not present

## 2022-03-04 DIAGNOSIS — Y9301 Activity, walking, marching and hiking: Secondary | ICD-10-CM | POA: Diagnosis present

## 2022-03-04 DIAGNOSIS — K5730 Diverticulosis of large intestine without perforation or abscess without bleeding: Secondary | ICD-10-CM | POA: Diagnosis not present

## 2022-03-04 DIAGNOSIS — S32028A Other fracture of second lumbar vertebra, initial encounter for closed fracture: Secondary | ICD-10-CM | POA: Diagnosis not present

## 2022-03-04 DIAGNOSIS — S01511A Laceration without foreign body of lip, initial encounter: Secondary | ICD-10-CM | POA: Diagnosis present

## 2022-03-04 DIAGNOSIS — S06310A Contusion and laceration of right cerebrum without loss of consciousness, initial encounter: Principal | ICD-10-CM

## 2022-03-04 DIAGNOSIS — E871 Hypo-osmolality and hyponatremia: Secondary | ICD-10-CM | POA: Diagnosis not present

## 2022-03-04 DIAGNOSIS — S0993XA Unspecified injury of face, initial encounter: Secondary | ICD-10-CM | POA: Diagnosis not present

## 2022-03-04 DIAGNOSIS — S32039A Unspecified fracture of third lumbar vertebra, initial encounter for closed fracture: Secondary | ICD-10-CM | POA: Diagnosis present

## 2022-03-04 DIAGNOSIS — S0181XD Laceration without foreign body of other part of head, subsequent encounter: Secondary | ICD-10-CM | POA: Diagnosis not present

## 2022-03-04 DIAGNOSIS — S32018A Other fracture of first lumbar vertebra, initial encounter for closed fracture: Secondary | ICD-10-CM | POA: Diagnosis not present

## 2022-03-04 DIAGNOSIS — Z86010 Personal history of colonic polyps: Secondary | ICD-10-CM | POA: Diagnosis not present

## 2022-03-04 DIAGNOSIS — S2242XA Multiple fractures of ribs, left side, initial encounter for closed fracture: Secondary | ICD-10-CM | POA: Diagnosis not present

## 2022-03-04 DIAGNOSIS — Z9181 History of falling: Secondary | ICD-10-CM | POA: Diagnosis not present

## 2022-03-04 DIAGNOSIS — S2242XD Multiple fractures of ribs, left side, subsequent encounter for fracture with routine healing: Secondary | ICD-10-CM | POA: Diagnosis not present

## 2022-03-04 LAB — CBC
HCT: 35.6 % — ABNORMAL LOW (ref 36.0–46.0)
Hemoglobin: 12.1 g/dL (ref 12.0–15.0)
MCH: 31.3 pg (ref 26.0–34.0)
MCHC: 34 g/dL (ref 30.0–36.0)
MCV: 92.2 fL (ref 80.0–100.0)
Platelets: 302 10*3/uL (ref 150–400)
RBC: 3.86 MIL/uL — ABNORMAL LOW (ref 3.87–5.11)
RDW: 12.7 % (ref 11.5–15.5)
WBC: 7.6 10*3/uL (ref 4.0–10.5)
nRBC: 0 % (ref 0.0–0.2)

## 2022-03-04 LAB — COMPREHENSIVE METABOLIC PANEL
ALT: 17 U/L (ref 0–44)
AST: 23 U/L (ref 15–41)
Albumin: 3.8 g/dL (ref 3.5–5.0)
Alkaline Phosphatase: 79 U/L (ref 38–126)
Anion gap: 10 (ref 5–15)
BUN: 14 mg/dL (ref 8–23)
CO2: 26 mmol/L (ref 22–32)
Calcium: 9.2 mg/dL (ref 8.9–10.3)
Chloride: 103 mmol/L (ref 98–111)
Creatinine, Ser: 0.8 mg/dL (ref 0.44–1.00)
GFR, Estimated: >60 mL/min (ref >60)
Glucose, Bld: 107 mg/dL — ABNORMAL HIGH (ref 70–99)
Potassium: 3.9 mmol/L (ref 3.5–5.1)
Sodium: 139 mmol/L (ref 135–145)
Total Bilirubin: 0.4 mg/dL (ref 0.3–1.2)
Total Protein: 6.2 g/dL — ABNORMAL LOW (ref 6.5–8.1)

## 2022-03-04 MED ORDER — TETANUS-DIPHTH-ACELL PERTUSSIS 5-2.5-18.5 LF-MCG/0.5 IM SUSY: 0.5000 mL | PREFILLED_SYRINGE | Freq: Once | INTRAMUSCULAR | Status: DC

## 2022-03-04 MED ORDER — LEVETIRACETAM IN NACL 500 MG/100ML IV SOLN
500.0000 mg | Freq: Two times a day (BID) | INTRAVENOUS | Status: DC
  Administered 2022-03-05: 500 mg via INTRAVENOUS
  Filled 2022-03-04 (×2): qty 100

## 2022-03-04 MED ORDER — MORPHINE SULFATE (PF) 2 MG/ML IV SOLN: 2.0000 mg | INTRAVENOUS | Status: DC | PRN

## 2022-03-04 MED ORDER — ACETAMINOPHEN 500 MG PO TABS
1000.0000 mg | ORAL_TABLET | Freq: Once | ORAL | Status: AC
Start: 2022-03-04 — End: 2022-03-05
  Administered 2022-03-05: 1000 mg via ORAL
  Filled 2022-03-04: qty 2

## 2022-03-04 MED ORDER — ACETAMINOPHEN 325 MG PO TABS
650.0000 mg | ORAL_TABLET | ORAL | Status: DC | PRN
  Administered 2022-03-06: 650 mg via ORAL
  Filled 2022-03-04: qty 2

## 2022-03-04 MED ORDER — LIDOCAINE-EPINEPHRINE (PF) 2 %-1:200000 IJ SOLN
20.0000 mL | Freq: Once | INTRAMUSCULAR | Status: AC
  Administered 2022-03-04: 20 mL via INTRADERMAL
  Filled 2022-03-04: qty 20

## 2022-03-04 MED ORDER — IOHEXOL 300 MG/ML  SOLN
80.0000 mL | Freq: Once | INTRAMUSCULAR | Status: AC | PRN
  Administered 2022-03-04: 80 mL via INTRAVENOUS

## 2022-03-04 MED ORDER — METOPROLOL TARTRATE 5 MG/5ML IV SOLN: 5.0000 mg | Freq: Four times a day (QID) | INTRAVENOUS | Status: DC | PRN

## 2022-03-04 MED ORDER — DOCUSATE SODIUM 100 MG PO CAPS
100.0000 mg | ORAL_CAPSULE | Freq: Two times a day (BID) | ORAL | Status: DC
Start: 2022-03-04 — End: 2022-03-08
  Administered 2022-03-05 – 2022-03-08 (×8): 100 mg via ORAL
  Filled 2022-03-04 (×8): qty 1

## 2022-03-04 MED ORDER — ONDANSETRON 4 MG PO TBDP: 4.0000 mg | ORAL_TABLET | Freq: Four times a day (QID) | ORAL | Status: DC | PRN

## 2022-03-04 MED ORDER — ONDANSETRON HCL 4 MG/2ML IJ SOLN
4.0000 mg | Freq: Four times a day (QID) | INTRAMUSCULAR | Status: DC | PRN
  Administered 2022-03-08: 4 mg via INTRAVENOUS
  Filled 2022-03-04: qty 2

## 2022-03-04 NOTE — ED Notes (Signed)
Pt transported to ct at this time 

## 2022-03-04 NOTE — H&P (Addendum)
Activation and Reason: level II, fall  Cynthia [name] is an 76 y.o. female.  HPI: 76 yo female was walking on a sidewalk when her shoe caught the concrete and she fell face first. She denies loss of consciousness. She has a history of stroke and has weekness on the left side from that. She complains of pain in face above her lip.  Past Medical History:  Diagnosis Date   Breast cancer (HCC)    Colon polyps    Depression    history of   Diverticulitis    GERD (gastroesophageal reflux disease)    Hypertension    Osteoporosis    Seizures (HCC)    Stroke [facility])     Past Surgical History:  Procedure Laterality Date   BREAST LUMPECTOMY WITH RADIOACTIVE SEED LOCALIZATION Left 08/28/2020   Procedure: LEFT BREAST LUMPECTOMY WITH RADIOACTIVE SEED LOCALIZATION;  Surgeon: Almond Lint, MD;  Location: [facility] OR;  Service: General;  Laterality: Left;  RNFA   IR GENERIC HISTORICAL  08/05/2016   IR ANGIO VERTEBRAL SEL VERTEBRAL UNI R MOD SED 08/05/2016 Julieanne Cotton, MD [facility]INTER[facility]   IR GENERIC HISTORICAL  08/05/2016   IR ANGIO INTRA EXTRACRAN SEL COM CAROTID INNOMINATE BILAT MOD SED 08/05/2016 Julieanne Cotton, MD [facility]   IR GENERIC HISTORICAL  08/05/2016   IR ANGIO VERTEBRAL SEL SUBCLAVIAN INNOMINATE UNI L MOD SED 08/05/2016 Julieanne Cotton, MD [facility]   MOUTH SURGERY     TONSILLECTOMY     TUBAL LIGATION      Family History  Problem Relation Age of Onset   Heart disease Mother    Cancer Mother        lung   Stroke Paternal Grandmother    Stomach cancer Maternal Grandfather     Social History:  reports that she has never smoked. She has never been exposed to tobacco smoke. She has never used smokeless tobacco. She reports current alcohol use of about 1.0 standard drink of alcohol per week. She reports that she does not currently use drugs.  Allergies:  Allergies  Allergen Reactions   Other Anaphylaxis    FIRE ANTS Insect stings / bites   Acyclovir And  Related Other (See Comments)    Unknown reaction   Codeine Other (See Comments)    Syncope    Penicillins Rash    Has patient had a PCN reaction causing immediate rash, facial/tongue/throat swelling, SOB or lightheadedness with hypotension: Unknown Has patient had a PCN reaction causing severe rash involving mucus membranes or skin necrosis: No Has patient had a PCN reaction that required hospitalization No Has patient had a PCN reaction occurring within the last 10 years: No If all of the above answers are "NO", then may proceed with Cephalosporin use.     Medications: I have reviewed the patient's current medications.  Results for orders placed or performed during the hospital encounter of 03/04/22 (from the past 48 hour(s))  CBC     Status: Abnormal   Collection Time: 03/04/22  7:06 PM  Result Value Ref Range   WBC 7.6 4.0 - 10.5 K/uL   RBC 3.86 (L) 3.87 - 5.11 MIL/uL   Hemoglobin 12.1 12.0 - 15.0 g/dL   HCT 32.4 (L) 40.1 - 02.7 %   MCV 92.2 80.0 - 100.0 fL   MCH 31.3 26.0 - 34.0 pg   MCHC 34.0 30.0 - 36.0 g/dL   RDW 25.3 66.4 - 40.3 %   Platelets 302 150 - 400 K/uL   nRBC  0.0 0.0 - 0.2 %    Comment: Performed at [facility], [address], [place], [place] [address]  Comprehensive metabolic panel     Status: Abnormal   Collection Time: 03/04/22  7:06 PM  Result Value Ref Range   Sodium 139 135 - 145 mmol/L   Potassium 3.9 3.5 - 5.1 mmol/L   Chloride 103 98 - 111 mmol/L   CO2 26 22 - 32 mmol/L   Glucose, Bld 107 (H) 70 - 99 mg/dL    Comment: Glucose reference range applies only to samples taken after fasting for at least 8 hours.   BUN 14 8 - 23 mg/dL   Creatinine, Ser 1.91 0.44 - 1.00 mg/dL   Calcium 9.2 8.9 - 47.8 mg/dL   Total Protein 6.2 (L) 6.5 - 8.1 g/dL   Albumin 3.8 3.5 - 5.0 g/dL   AST 23 15 - 41 U/L   ALT 17 0 - 44 U/L   Alkaline Phosphatase 79 38 - 126 U/L   Total Bilirubin 0.4 0.3 - 1.2 mg/dL   GFR, Estimated >29 >56 mL/min    Comment:  (NOTE) Calculated using the CKD-EPI Creatinine Equation (2021)    Anion gap 10 5 - 15    Comment: Performed at [facility], [address] [place], [place] [address]    CT CHEST ABDOMEN PELVIS W CONTRAST  Result Date: 03/04/2022 CLINICAL DATA:  Fall.  Polytrauma, blunt EXAM: CT CHEST, ABDOMEN, AND PELVIS WITH CONTRAST TECHNIQUE: Multidetector CT imaging of the chest, abdomen and pelvis was performed following the standard protocol during bolus administration of intravenous contrast. RADIATION DOSE REDUCTION: This exam was performed according to the departmental dose-optimization program which includes automated exposure control, adjustment of the mA and/or kV according to patient size and/or use of iterative reconstruction technique. CONTRAST:  80mL OMNIPAQUE IOHEXOL 300 MG/ML  SOLN COMPARISON:  None Available. FINDINGS: CT CHEST FINDINGS Cardiovascular: Mild coronary artery calcification. Global cardiac size within normal limits. No pericardial effusion. Central pulmonary arteries are of normal caliber. Mild atherosclerotic calcification within the thoracic aorta. No aortic aneurysm Mediastinum/Nodes: 2.3 cm heterogeneously enhancing nodule within the right thyroid lobe. No pathologic mediastinal adenopathy. Esophagus unremarkable. No pneumomediastinum. No mediastinal hematoma. Lungs/Pleura: Elevation of the left hemidiaphragm with associated left basilar atelectasis. Lungs are otherwise clear. No pneumothorax or pleural effusion. No central obstructing lesion. Musculoskeletal: There is an acute fracture of the left twelfth rib posteriorly. Healed fractures of the right 10-12 ribs posteriorly. Remote T8 compression deformity appears stable since chest radiograph of 09/28/2021. Numerous surgical clips are seen within the left breast. CT ABDOMEN PELVIS FINDINGS Hepatobiliary: No focal liver abnormality is seen. No gallstones, gallbladder wall thickening, or biliary dilatation. Pancreas: Marked fatty  atrophy of the pancreas. No peripancreatic inflammatory changes identified. Spleen: Unremarkable Adrenals/Urinary Tract: The adrenal glands are unremarkable. The kidneys are normal in size and position. Simple cortical cyst arises exophytically from the lower pole the left kidney. No further follow-up is recommended for this lesion. The kidneys are otherwise unremarkable. The bladder is unremarkable. Stomach/Bowel: Extensive sigmoid diverticulosis. The stomach, small bowel, and large bowel are otherwise unremarkable. No evidence of obstruction or focal inflammation. Appendix absent. No free intraperitoneal gas or fluid. Vascular/Lymphatic: Aortic atherosclerosis. Retroaortic left renal vein. No enlarged abdominal or pelvic lymph nodes. Reproductive: Uterus and bilateral adnexa are unremarkable. Other: No abdominal wall hernia. Musculoskeletal: There is an acute fracture of the left L2, L3 and L4 transverse processes with minimal displacement. The osseous  structures are diffusely osteopenic. Advanced degenerative changes are seen within the lumbar spine. No lytic or blastic bone lesions. IMPRESSION: 1. Acute fracture of the left twelfth rib posteriorly. 2. Acute fractures of the left L2, L3, and L4 transverse processes. 3. No acute intrathoracic or intra-abdominal injury. 4. Mild coronary artery calcification. 5. 2.3 cm heterogeneously enhancing nodule within the right thyroid lobe. Recommend thyroid US (ref: J Am Coll Radiol. 2015 Feb;12(2): 143-50). 6. Extensive sigmoid diverticulosis. Electronically Signed   By: Helyn Numbers M.D.   On: 03/04/2022 21:40   CT Head Wo Contrast  Result Date: 03/04/2022 CLINICAL DATA:  Larey Seat.  Facial trauma. EXAM: CT HEAD WITHOUT CONTRAST CT MAXILLOFACIAL WITHOUT CONTRAST CT CERVICAL SPINE WITHOUT CONTRAST TECHNIQUE: Multidetector CT imaging of the head, cervical spine, and maxillofacial structures were performed using the standard protocol without intravenous contrast.  Multiplanar CT image reconstructions of the cervical spine and maxillofacial structures were also generated. RADIATION DOSE REDUCTION: This exam was performed according to the departmental dose-optimization program which includes automated exposure control, adjustment of the mA and/or kV according to patient size and/or use of iterative reconstruction technique. COMPARISON:  Head CT 01/24/2022 FINDINGS: CT HEAD FINDINGS Brain: Stable age related cerebral atrophy, ventriculomegaly and periventricular white matter disease. There appears to be a new intraparenchymal hematoma in the high right frontal area. There was a hematoma in this area on the prior CT scan but was more posterior inferior and lateral. This is right up against the inter hemispheric fissure and I think is also some associated interhemispheric subdural blood. No significant mass effect or shift. This measures approximately 2.5 x 2.2 x 1.5 cm. No subarachnoid blood. The ventricles are in the midline. Vascular: Stable vascular calcifications. No aneurysm or hyperdense vessels. Skull: No acute skull fracture. Other: The paranasal sinuses and mastoid air cells are clear except for debris in the right maxillary sinus. The globes are intact. CT MAXILLOFACIAL FINDINGS Osseous: No acute facial bone fractures. Orbits: The orbits are intact. No orbital fracture. The globes are intact. No orbital floor fracture. Sinuses: Small amount of fluid and debris in the right maxillary sinus. The other paranasal sinuses and mastoid air cells are clear. Soft tissues: No significant soft tissue injury. CT CERVICAL SPINE FINDINGS Alignment: Advanced degenerative cervical spondylosis with degenerative subluxations but no change since prior study. Skull base and vertebrae: No acute cervical spine fracture. Soft tissues and spinal canal: No prevertebral fluid or swelling. No visible canal hematoma. Disc levels: The spinal canal is fairly generous. No large disc protrusions or  significant canal stenosis. Upper chest: The lung apices are grossly clear. Other: Stable 2 cm right thyroid lesion. IMPRESSION: 1. New intraparenchymal hematoma in the high right frontal area. Small amount of associated interhemispheric subdural blood. No significant mass effect or shift. 2. Stable age related cerebral atrophy, ventriculomegaly and periventricular white matter disease. 3. No acute skull or facial bone fractures. 4. Advanced degenerative cervical spondylosis but no acute cervical spine fracture. Electronically Signed   By: Rudie Meyer M.D.   On: 03/04/2022 21:29   CT Maxillofacial Wo Contrast  Result Date: 03/04/2022 CLINICAL DATA:  Larey Seat.  Facial trauma. EXAM: CT HEAD WITHOUT CONTRAST CT MAXILLOFACIAL WITHOUT CONTRAST CT CERVICAL SPINE WITHOUT CONTRAST TECHNIQUE: Multidetector CT imaging of the head, cervical spine, and maxillofacial structures were performed using the standard protocol without intravenous contrast. Multiplanar CT image reconstructions of the cervical spine and maxillofacial structures were also generated. RADIATION DOSE REDUCTION: This exam was performed according to the departmental  dose-optimization program which includes automated exposure control, adjustment of the mA and/or kV according to patient size and/or use of iterative reconstruction technique. COMPARISON:  Head CT 01/24/2022 FINDINGS: CT HEAD FINDINGS Brain: Stable age related cerebral atrophy, ventriculomegaly and periventricular white matter disease. There appears to be a new intraparenchymal hematoma in the high right frontal area. There was a hematoma in this area on the prior CT scan but was more posterior inferior and lateral. This is right up against the inter hemispheric fissure and I think is also some associated interhemispheric subdural blood. No significant mass effect or shift. This measures approximately 2.5 x 2.2 x 1.5 cm. No subarachnoid blood. The ventricles are in the midline. Vascular: Stable  vascular calcifications. No aneurysm or hyperdense vessels. Skull: No acute skull fracture. Other: The paranasal sinuses and mastoid air cells are clear except for debris in the right maxillary sinus. The globes are intact. CT MAXILLOFACIAL FINDINGS Osseous: No acute facial bone fractures. Orbits: The orbits are intact. No orbital fracture. The globes are intact. No orbital floor fracture. Sinuses: Small amount of fluid and debris in the right maxillary sinus. The other paranasal sinuses and mastoid air cells are clear. Soft tissues: No significant soft tissue injury. CT CERVICAL SPINE FINDINGS Alignment: Advanced degenerative cervical spondylosis with degenerative subluxations but no change since prior study. Skull base and vertebrae: No acute cervical spine fracture. Soft tissues and spinal canal: No prevertebral fluid or swelling. No visible canal hematoma. Disc levels: The spinal canal is fairly generous. No large disc protrusions or significant canal stenosis. Upper chest: The lung apices are grossly clear. Other: Stable 2 cm right thyroid lesion. IMPRESSION: 1. New intraparenchymal hematoma in the high right frontal area. Small amount of associated interhemispheric subdural blood. No significant mass effect or shift. 2. Stable age related cerebral atrophy, ventriculomegaly and periventricular white matter disease. 3. No acute skull or facial bone fractures. 4. Advanced degenerative cervical spondylosis but no acute cervical spine fracture. Electronically Signed   By: Rudie Meyer M.D.   On: 03/04/2022 21:29   CT Cervical Spine Wo Contrast  Result Date: 03/04/2022 CLINICAL DATA:  Larey Seat.  Facial trauma. EXAM: CT HEAD WITHOUT CONTRAST CT MAXILLOFACIAL WITHOUT CONTRAST CT CERVICAL SPINE WITHOUT CONTRAST TECHNIQUE: Multidetector CT imaging of the head, cervical spine, and maxillofacial structures were performed using the standard protocol without intravenous contrast. Multiplanar CT image reconstructions of the  cervical spine and maxillofacial structures were also generated. RADIATION DOSE REDUCTION: This exam was performed according to the departmental dose-optimization program which includes automated exposure control, adjustment of the mA and/or kV according to patient size and/or use of iterative reconstruction technique. COMPARISON:  Head CT 01/24/2022 FINDINGS: CT HEAD FINDINGS Brain: Stable age related cerebral atrophy, ventriculomegaly and periventricular white matter disease. There appears to be a new intraparenchymal hematoma in the high right frontal area. There was a hematoma in this area on the prior CT scan but was more posterior inferior and lateral. This is right up against the inter hemispheric fissure and I think is also some associated interhemispheric subdural blood. No significant mass effect or shift. This measures approximately 2.5 x 2.2 x 1.5 cm. No subarachnoid blood. The ventricles are in the midline. Vascular: Stable vascular calcifications. No aneurysm or hyperdense vessels. Skull: No acute skull fracture. Other: The paranasal sinuses and mastoid air cells are clear except for debris in the right maxillary sinus. The globes are intact. CT MAXILLOFACIAL FINDINGS Osseous: No acute facial bone fractures. Orbits: The orbits are  intact. No orbital fracture. The globes are intact. No orbital floor fracture. Sinuses: Small amount of fluid and debris in the right maxillary sinus. The other paranasal sinuses and mastoid air cells are clear. Soft tissues: No significant soft tissue injury. CT CERVICAL SPINE FINDINGS Alignment: Advanced degenerative cervical spondylosis with degenerative subluxations but no change since prior study. Skull base and vertebrae: No acute cervical spine fracture. Soft tissues and spinal canal: No prevertebral fluid or swelling. No visible canal hematoma. Disc levels: The spinal canal is fairly generous. No large disc protrusions or significant canal stenosis. Upper chest: The  lung apices are grossly clear. Other: Stable 2 cm right thyroid lesion. IMPRESSION: 1. New intraparenchymal hematoma in the high right frontal area. Small amount of associated interhemispheric subdural blood. No significant mass effect or shift. 2. Stable age related cerebral atrophy, ventriculomegaly and periventricular white matter disease. 3. No acute skull or facial bone fractures. 4. Advanced degenerative cervical spondylosis but no acute cervical spine fracture. Electronically Signed   By: Rudie Meyer M.D.   On: 03/04/2022 21:29    Review of Systems  Constitutional: Negative.   HENT: Negative.    Eyes: Negative.   Respiratory: Negative.    Cardiovascular: Negative.   Gastrointestinal: Negative.   Genitourinary: Negative.   Musculoskeletal: Negative.   Skin: Negative.   Neurological:  Positive for focal weakness.  Endo/Heme/Allergies: Negative.   Psychiatric/Behavioral: Negative.      PE Blood pressure 131/73, pulse 88, temperature 98.2 F (36.8 C), temperature source Oral, resp. rate 18, height 5\' 6"  (1.676 m), weight 61.2 kg, SpO2 97 %. Constitutional: NAD; conversant; lip laceration repaired Eyes: Moist conjunctiva; no lid lag; anicteric; PERRL Neck: Trachea midline; no thyromegaly, no cervicalgia Lungs: Normal respiratory effort; no tactile fremitus CV: RRR; no palpable thrills; no pitting edema GI: Abd soft, NT; no palpable hepatosplenomegaly MSK: unable to assess gait; no clubbing/cyanosis, weakness in left hand grip Psychiatric: Appropriate affect; alert and oriented x3 Lymphatic: No palpable cervical or axillary lymphadenopathy   Assessment/Plan: 76 yo female at ground level fall  SDH/IPH - NSGY consult L 12th rib fx - pain control L L2-4 TP fx - pain control  FEN- NPO VTE- scds ID- no issue Dispo- admit to progressive neuro unit   I reviewed last 24 h vitals and pain scores, last 48 h intake and output, last 24 h labs and trends, and last 24 h imaging  results.  This care required high  level of medical decision making.   [place] Blanch Lyndie Vanderloop 03/04/2022, 11:10 PM

## 2022-03-04 NOTE — ED Notes (Signed)
Per pt she fell x 2 today. One she landed face first into concrete. C-collar in place abrasions noted to upper lip pt also bit through upper lip.

## 2022-03-04 NOTE — ED Notes (Signed)
Per ortho tech we do not have an aspen collar we only have a soft collar message sent to provider reference same. NT is at bedside cleaning wound at this time

## 2022-03-04 NOTE — ED Notes (Signed)
Ed provider at bedside. Ortho tech called at this time. Suture cart  placed at bedside3

## 2022-03-05 ENCOUNTER — Inpatient Hospital Stay (HOSPITAL_COMMUNITY): Payer: Medicare PPO

## 2022-03-05 LAB — BASIC METABOLIC PANEL
Anion gap: 9 (ref 5–15)
BUN: 9 mg/dL (ref 8–23)
CO2: 27 mmol/L (ref 22–32)
Calcium: 9 mg/dL (ref 8.9–10.3)
Chloride: 105 mmol/L (ref 98–111)
Creatinine, Ser: 0.71 mg/dL (ref 0.44–1.00)
GFR, Estimated: >60 mL/min (ref >60)
Glucose, Bld: 95 mg/dL (ref 70–99)
Potassium: 3.7 mmol/L (ref 3.5–5.1)
Sodium: 141 mmol/L (ref 135–145)

## 2022-03-05 LAB — CBC
HCT: 35.4 % — ABNORMAL LOW (ref 36.0–46.0)
Hemoglobin: 12.1 g/dL (ref 12.0–15.0)
MCH: 30.8 pg (ref 26.0–34.0)
MCHC: 34.2 g/dL (ref 30.0–36.0)
MCV: 90.1 fL (ref 80.0–100.0)
Platelets: 295 10*3/uL (ref 150–400)
RBC: 3.93 MIL/uL (ref 3.87–5.11)
RDW: 12.6 % (ref 11.5–15.5)
WBC: 6.7 10*3/uL (ref 4.0–10.5)
nRBC: 0 % (ref 0.0–0.2)

## 2022-03-05 MED ORDER — METHOCARBAMOL 500 MG PO TABS
500.0000 mg | ORAL_TABLET | Freq: Three times a day (TID) | ORAL | Status: DC | PRN
  Administered 2022-03-05 – 2022-03-06 (×2): 500 mg via ORAL
  Filled 2022-03-05 (×2): qty 1

## 2022-03-05 MED ORDER — BACITRACIN ZINC 500 UNIT/GM EX OINT
TOPICAL_OINTMENT | Freq: Every day | CUTANEOUS | Status: DC
  Administered 2022-03-07: 1 via TOPICAL
  Filled 2022-03-05: qty 28.4

## 2022-03-05 MED ORDER — OXYCODONE HCL 5 MG PO TABS
2.5000 mg | ORAL_TABLET | ORAL | Status: DC | PRN
  Administered 2022-03-05: 5 mg via ORAL
  Filled 2022-03-05: qty 1

## 2022-03-05 MED ORDER — LIDOCAINE 5 % EX PTCH
1.0000 | MEDICATED_PATCH | CUTANEOUS | Status: DC
  Administered 2022-03-05 – 2022-03-08 (×4): 1 via TRANSDERMAL
  Filled 2022-03-05 (×4): qty 1

## 2022-03-05 MED ORDER — OXYCODONE HCL 5 MG PO TABS: 5.0000 mg | ORAL_TABLET | ORAL | Status: DC | PRN

## 2022-03-05 MED ORDER — LEVETIRACETAM IN NACL 1000 MG/100ML IV SOLN
1000.0000 mg | Freq: Two times a day (BID) | INTRAVENOUS | Status: DC
  Administered 2022-03-05 (×2): 1000 mg via INTRAVENOUS
  Filled 2022-03-05 (×3): qty 100

## 2022-03-05 NOTE — TOC CAGE-AID Note (Signed)
Transition of Care [facility]) - CAGE-AID Screening   Patient Details  Name: Srushti Berman MRN: [id number] Date of Birth: 12-28-1945  Transition of Care [facility]) CM/SW Contact:    Leota Sauers, RN Phone Number: 03/05/2022, 10:38 AM   Clinical Narrative: Patient sitting up in recliner, A&Ox4, denies alcohol use since having stroke, minimal use before. Denies drug use.   CAGE-AID Screening:    Have You Ever Felt You Ought to Cut Down on Your Drinking or Drug Use?: No Have People Annoyed You By Critizing Your Drinking Or Drug Use?: No Have You Felt Bad Or Guilty About Your Drinking Or Drug Use?: No Have You Ever Had a Drink or Used Drugs First Thing In The Morning to Steady Your Nerves or to Get Rid of a Hangover?: No CAGE-AID Score: 0  Substance Abuse Education Offered: No

## 2022-03-06 MED ORDER — METHOCARBAMOL 500 MG PO TABS
500.0000 mg | ORAL_TABLET | Freq: Three times a day (TID) | ORAL | Status: DC
  Administered 2022-03-06 – 2022-03-08 (×6): 500 mg via ORAL
  Filled 2022-03-06 (×6): qty 1

## 2022-03-06 MED ORDER — LEVETIRACETAM IN NACL 1000 MG/100ML IV SOLN
1000.0000 mg | Freq: Two times a day (BID) | INTRAVENOUS | Status: DC
Start: 2022-03-06 — End: 2022-03-07
  Administered 2022-03-06 – 2022-03-07 (×2): 1000 mg via INTRAVENOUS
  Filled 2022-03-06 (×3): qty 100

## 2022-03-06 MED ORDER — IBUPROFEN 200 MG PO TABS
400.0000 mg | ORAL_TABLET | Freq: Four times a day (QID) | ORAL | Status: DC
  Administered 2022-03-06 – 2022-03-08 (×8): 400 mg via ORAL
  Filled 2022-03-06 (×9): qty 2

## 2022-03-06 MED ORDER — ACETAMINOPHEN 500 MG PO TABS
1000.0000 mg | ORAL_TABLET | Freq: Four times a day (QID) | ORAL | Status: DC
  Administered 2022-03-06 – 2022-03-08 (×7): 1000 mg via ORAL
  Filled 2022-03-06 (×8): qty 2

## 2022-03-07 MED ORDER — LEVETIRACETAM 500 MG PO TABS
1000.0000 mg | ORAL_TABLET | Freq: Two times a day (BID) | ORAL | Status: DC
  Administered 2022-03-07 – 2022-03-08 (×3): 1000 mg via ORAL
  Filled 2022-03-07 (×3): qty 2

## 2022-03-07 MED ORDER — ACETAMINOPHEN 500 MG PO TABS
1000.0000 mg | ORAL_TABLET | Freq: Four times a day (QID) | ORAL | Status: AC | PRN
Start: 2022-03-07 — End: ?

## 2022-03-07 MED ORDER — LOSARTAN POTASSIUM 50 MG PO TABS
100.0000 mg | ORAL_TABLET | Freq: Every day | ORAL | Status: DC
  Administered 2022-03-07 – 2022-03-08 (×2): 100 mg via ORAL
  Filled 2022-03-07 (×2): qty 2

## 2022-03-07 MED ORDER — VENLAFAXINE HCL ER 75 MG PO CP24
150.0000 mg | ORAL_CAPSULE | Freq: Every day | ORAL | Status: DC
  Administered 2022-03-08: 150 mg via ORAL
  Filled 2022-03-07: qty 2

## 2022-03-07 MED ORDER — BACITRACIN ZINC 500 UNIT/GM EX OINT: TOPICAL_OINTMENT | Freq: Every day | CUTANEOUS | 0 refills | Status: DC

## 2022-03-07 MED ORDER — LIDOCAINE 5 % EX PTCH: 1.0000 | MEDICATED_PATCH | CUTANEOUS | 0 refills | Status: DC

## 2022-03-07 MED ORDER — IBUPROFEN 400 MG PO TABS: 400.0000 mg | ORAL_TABLET | Freq: Four times a day (QID) | ORAL | Status: DC | PRN

## 2022-03-07 NOTE — Progress Notes (Signed)
Physical Therapy Treatment Patient Details Name: Cynthia [name] MRN: [id number] DOB: 1946/01/14 Today's Date: 03/07/2022   History of Present Illness Pt is a 76 yo female presenting from SNF on 6/23 after ground-level fall. CT showed acute traumatic R frontal IPH, transverse process fractures of L2-L4, and left 12th rib fracture. Pt recently admitted 5/1 with R frontal ICH, d/c to [facility] on 5/9 and then d/c from [facility] to SNF on 5/31 for continued rehab. PMH: breast ca, colon  polyps, depression, diverticulitis GERD, HTN, seizures, and stroke.    PT Comments    The pt was agreeable to session with focus on progression of ambulation in the room and capacity for sit-stand transfers. The pt continues to present with limited ROM, coordination, and strength in LLE. She made progress with decreased assist needed with repeated sit-stand practice, but does need repeated cues and does not demo carryover from instruction. The pt was able to progress ambulation distance, but demos deficits in strength in LLE, managing RW, posture, and balance needing minA to steady and to manage RW. The pt will continue to benefit from skilled PT to facilitate maximal recovery prior to eventual return home. Continue to recommend return to SNF.    Recommendations for follow up therapy are one component of a multi-disciplinary discharge planning process, led by the attending physician.  Recommendations may be updated based on patient status, additional functional criteria and insurance authorization.  Follow Up Recommendations  Skilled nursing-short term rehab (<3 hours/day) (return to SNF and resume therapies there) Can patient physically be transported by private vehicle: Yes   Assistance Recommended at Discharge Frequent or constant Supervision/Assistance  Patient can return home with the following A little help with walking and/or transfers;A little help with bathing/dressing/bathroom;Direct supervision/assist for medications  management;Assist for transportation;Help with stairs or ramp for entrance   Equipment Recommendations   (defer to post acute)    Recommendations for Other Services       Precautions / Restrictions Precautions Precautions: Fall Precaution Comments: pt with multiple recent falls, x2 on day of admission, L sided weakness after ICH on 01/10/2022 Restrictions Weight Bearing Restrictions: No     Mobility  Bed Mobility               General bed mobility comments: pt OOB at start and end of session    Transfers Overall transfer level: Needs assistance Equipment used: Rolling walker (2 wheels) Transfers: Sit to/from Stand Sit to Stand: Mod assist, Min assist           General transfer comment: modA at times due to poor ability to power up and max cues needed for sequencing. improved to minA at times but inconsistent    Ambulation/Gait Ambulation/Gait assistance: Min assist Gait Distance (Feet): 25 Feet Assistive device: Rolling walker (2 wheels) Gait Pattern/deviations: Step-to pattern, Decreased step length - left, Decreased stance time - left, Decreased stride length, Decreased dorsiflexion - left, Shuffle Gait velocity: decreased Gait velocity interpretation: <1.31 ft/sec, indicative of household ambulator   General Gait Details: pt with small steps and poor advancement of LLE without cues. limited in DF and positioning due to ROM limitations. Needed repeated cues and minA to steady and manage RW       Modified Rankin (Stroke Patients Only) Modified Rankin (Stroke Patients Only) Pre-Morbid Rankin Score: Moderate disability Modified Rankin: Moderately severe disability     Balance Overall balance assessment: History of Falls, Needs assistance Sitting-balance support: No upper extremity supported, Feet supported Sitting balance-Leahy Scale: Fair  Standing balance support: Bilateral upper extremity supported, During functional activity Standing  balance-Leahy Scale: Poor Standing balance comment: BUE support and minA                            Cognition Arousal/Alertness: Awake/alert Behavior During Therapy: WFL for tasks assessed/performed Overall Cognitive Status: History of cognitive impairments - at baseline Area of Impairment: Attention, Following commands, Safety/judgement, Problem solving                   Current Attention Level: Focused   Following Commands: Follows one step commands with increased time Safety/Judgement: Decreased awareness of safety, Decreased awareness of deficits   Problem Solving: Slow processing, Decreased initiation, Requires verbal cues General Comments: pt needing max increased time and at times repeated cues to complete tasks. highly distracted both internally and by any environmental distractions. Pt able to appropriately answer orientation questions        Exercises Other Exercises Other Exercises: repeated sit-stand from recliner x6    General Comments General comments (skin integrity, edema, etc.): HR to 145bpm with ambulation      Pertinent Vitals/Pain Pain Assessment Pain Assessment: No/denies pain Pain Intervention(s): Monitored during session     PT Goals (current goals can now be found in the care plan section) Acute Rehab PT Goals Patient Stated Goal: to get better and eventually return home to see her dog PT Goal Formulation: With patient Time For Goal Achievement: 03/19/22 Potential to Achieve Goals: Good Progress towards PT goals: Progressing toward goals    Frequency    Min 3X/week      PT Plan Current plan remains appropriate       AM-PAC PT "6 Clicks" Mobility   Outcome Measure  Help needed turning from your back to your side while in a flat bed without using bedrails?: A Little Help needed moving from lying on your back to sitting on the side of a flat bed without using bedrails?: A Little Help needed moving to and from a bed to  a chair (including a wheelchair)?: A Little Help needed standing up from a chair using your arms (e.g., wheelchair or bedside chair)?: A Little Help needed to walk in hospital room?: A Lot Help needed climbing 3-5 steps with a railing? : A Lot 6 Click Score: 16    End of Session Equipment Utilized During Treatment: Gait belt Activity Tolerance: Patient tolerated treatment well;Patient limited by fatigue Patient left: in chair;with call bell/phone within reach;with chair alarm set Nurse Communication: Mobility status PT Visit Diagnosis: Other abnormalities of gait and mobility (R26.89);Muscle weakness (generalized) (M62.81);Repeated falls (R29.6);Hemiplegia and hemiparesis Hemiplegia - Right/Left: Left Hemiplegia - dominant/non-dominant: Non-dominant Hemiplegia - caused by: Nontraumatic intracerebral hemorrhage (subacute)     Time: 1610-9604 PT Time Calculation (min) (ACUTE ONLY): 26 min  Charges:  $Gait Training: 8-22 mins $Therapeutic Exercise: 8-22 mins                     Vickki Muff, PT, DPT   Acute Rehabilitation Department   Ronnie Derby 03/07/2022, 2:43 PM

## 2022-03-08 NOTE — TOC Transition Note (Signed)
Transition of Care [facility]) - CM/SW Discharge Note   Patient Details  Name: Nyanza Kindschi MRN: [id number] Date of Birth: 1946/05/15  [facility] CM/SW Contact:  Glennon Mac, RN Phone Number: 03/08/2022, 10:55 AM   Clinical Narrative:    Insurance authorization received from [facility] [place] # [id number].  Patient pleased to return to [facility] today, and bed available.  Notified Tresa Endo in admissions that auth received; discharge summary forwarded to admissions dept at facility.  Spoke with son, Mardelle Matte, who plans to pick patient up around 1pm and transport to facility.  Nurse updated.    Final next level of care: Skilled Nursing Facility Barriers to Discharge: Barriers Resolved   Patient Goals and CMS Choice   CMS Medicare.gov Compare Post Acute Care list provided to:: Patient Choice offered to / list presented to : Patient  Discharge Placement  [facility]                     Discharge Plan and Services   Discharge Planning Services: CM Consult Post Acute Care Choice: Skilled Nursing Facility                               Social Determinants of Health (SDOH) Interventions     Readmission Risk Interventions     No data to display         Quintella Baton, RN, BSN  Trauma/Neuro ICU Case Manager [phone]

## 2022-03-17 DIAGNOSIS — Z9181 History of falling: Secondary | ICD-10-CM | POA: Diagnosis not present

## 2022-03-17 DIAGNOSIS — R2243 Localized swelling, mass and lump, lower limb, bilateral: Secondary | ICD-10-CM | POA: Diagnosis not present

## 2022-03-17 DIAGNOSIS — M6281 Muscle weakness (generalized): Secondary | ICD-10-CM | POA: Diagnosis not present

## 2022-03-17 DIAGNOSIS — I69354 Hemiplegia and hemiparesis following cerebral infarction affecting left non-dominant side: Secondary | ICD-10-CM | POA: Diagnosis not present

## 2022-03-17 DIAGNOSIS — R2689 Other abnormalities of gait and mobility: Secondary | ICD-10-CM | POA: Diagnosis not present

## 2022-03-21 DIAGNOSIS — Z8669 Personal history of other diseases of the nervous system and sense organs: Secondary | ICD-10-CM | POA: Diagnosis not present

## 2022-03-21 DIAGNOSIS — D649 Anemia, unspecified: Secondary | ICD-10-CM | POA: Diagnosis not present

## 2022-03-21 DIAGNOSIS — N181 Chronic kidney disease, stage 1: Secondary | ICD-10-CM | POA: Diagnosis not present

## 2022-03-25 ENCOUNTER — Other Ambulatory Visit: Payer: Self-pay | Admitting: Family Medicine

## 2022-03-25 NOTE — Telephone Encounter (Signed)
Requested medications are due for refill today.  unsure  Requested medications are on the active medications list.  no  Last refill. 06/18/2021 #12 3 refills  Future visit scheduled.   no  Notes to clinic.  Medication discontinued 02/09/2022    Requested Prescriptions  Pending Prescriptions Disp Refills   alendronate (FOSAMAX) 70 MG tablet [Pharmacy Med Name: ALENDRONATE SODIUM 70 MG TAB] 12 tablet 3    Sig: TAKE 1 TABLET BY MOUTH EVERY 7 DAYS. TAKE WITH FULL GLASS OF WATER ON AN EMPTY STOMACH     Endocrinology:  Bisphosphonates Failed - 03/25/2022  2:35 AM      Failed - Vitamin D in normal range and within 360 days    Vit D, 25-Hydroxy  Date Value Ref Range Status  06/26/2017 39 30 - 100 ng/mL Final    Comment:    Vitamin D Status         25-OH Vitamin D: . Deficiency:                    <20 ng/mL Insufficiency:             20 - 29 ng/mL Optimal:                 > or = 30 ng/mL . For 25-OH Vitamin D testing on patients on  D2-supplementation and patients for whom quantitation  of D2 and D3 fractions is required, the QuestAssureD(TM) 25-OH VIT D, (D2,D3), LC/MS/MS is recommended: order  code [id number] (patients >61yr). . For more information on this test, go to: http://education.questdiagnostics.com/faq/FAQ163 (This link is being provided for  informational/educational purposes only.)          Failed - Mg Level in normal range and within 360 days    No results found for: "MG"       Failed - Phosphate in normal range and within 360 days    No results found for: "PHOS"       Failed - Bone Mineral Density or Dexa Scan completed in the last 2 years      Passed - Ca in normal range and within 360 days    Calcium  Date Value Ref Range Status  03/05/2022 9.0 8.9 - 10.3 mg/dL Final   Calcium, Ion  Date Value Ref Range Status  03/20/2016 1.10 (L) 1.12 - 1.23 mmol/L Final         Passed - Cr in normal range and within 360 days    Creat  Date Value Ref Range Status   03/19/2021 0.96 (H) 0.60 - 0.93 mg/dL Final    Comment:    For patients >442years of age, the reference limit for Creatinine is approximately 13% higher for people identified as African-American. .    Creatinine, Ser  Date Value Ref Range Status  03/05/2022 0.71 0.44 - 1.00 mg/dL Final         Passed - eGFR is 30 or above and within 360 days    GFR, Est African American  Date Value Ref Range Status  03/19/2021 68 > OR = 60 mL/min/1.754mFinal   GFR, Est Non African American  Date Value Ref Range Status  03/19/2021 59 (L) > OR = 60 mL/min/1.7375minal   GFR, Estimated  Date Value Ref Range Status  03/05/2022 >60 >60 mL/min Final    Comment:    (NOTE) Calculated using the CKD-EPI Creatinine Equation (2021)          Passed - Valid encounter  within last 12 months    Recent Outpatient Visits           3 months ago Memory loss   [place] [facility] Schaumann, Cammie Mcgee, MD   4 months ago Memory loss   [place], Cammie Mcgee, MD   5 months ago Acute cough   [place] Susy Frizzle, MD   6 months ago Bacterial sinusitis   [place], NP   10 months ago Skin lesion   [place], Cammie Mcgee, MD       Future Appointments             In 1 month Garvin Fila, MD [facility]

## 2022-03-29 DIAGNOSIS — R52 Pain, unspecified: Secondary | ICD-10-CM | POA: Diagnosis not present

## 2022-03-29 DIAGNOSIS — R2689 Other abnormalities of gait and mobility: Secondary | ICD-10-CM | POA: Diagnosis not present

## 2022-03-29 DIAGNOSIS — W19XXXA Unspecified fall, initial encounter: Secondary | ICD-10-CM | POA: Diagnosis not present

## 2022-03-29 DIAGNOSIS — Z9181 History of falling: Secondary | ICD-10-CM | POA: Diagnosis not present

## 2022-03-29 DIAGNOSIS — M6281 Muscle weakness (generalized): Secondary | ICD-10-CM | POA: Diagnosis not present

## 2022-04-07 DIAGNOSIS — R2689 Other abnormalities of gait and mobility: Secondary | ICD-10-CM | POA: Diagnosis not present

## 2022-04-07 DIAGNOSIS — Z9181 History of falling: Secondary | ICD-10-CM | POA: Diagnosis not present

## 2022-04-07 DIAGNOSIS — R52 Pain, unspecified: Secondary | ICD-10-CM | POA: Diagnosis not present

## 2022-04-07 DIAGNOSIS — W19XXXA Unspecified fall, initial encounter: Secondary | ICD-10-CM | POA: Diagnosis not present

## 2022-04-07 DIAGNOSIS — M6281 Muscle weakness (generalized): Secondary | ICD-10-CM | POA: Diagnosis not present

## 2022-04-12 DIAGNOSIS — N390 Urinary tract infection, site not specified: Secondary | ICD-10-CM | POA: Diagnosis not present

## 2022-04-12 DIAGNOSIS — Z79899 Other long term (current) drug therapy: Secondary | ICD-10-CM | POA: Diagnosis not present

## 2022-04-12 DIAGNOSIS — I10 Essential (primary) hypertension: Secondary | ICD-10-CM | POA: Diagnosis not present

## 2022-04-13 DIAGNOSIS — Z9181 History of falling: Secondary | ICD-10-CM | POA: Diagnosis not present

## 2022-04-13 DIAGNOSIS — N182 Chronic kidney disease, stage 2 (mild): Secondary | ICD-10-CM | POA: Diagnosis not present

## 2022-04-13 DIAGNOSIS — R2689 Other abnormalities of gait and mobility: Secondary | ICD-10-CM | POA: Diagnosis not present

## 2022-04-13 DIAGNOSIS — M6281 Muscle weakness (generalized): Secondary | ICD-10-CM | POA: Diagnosis not present

## 2022-04-18 ENCOUNTER — Other Ambulatory Visit: Payer: Self-pay

## 2022-04-18 NOTE — Patient Outreach (Signed)
[facility]) Care Management  04/18/2022  Cynthia [name] March 13, 1946 [id number]   Telephone outreach to patient to obtain mRS was successfully completed. MRS= [place] Care Management Assistant [phone]

## 2022-04-20 DIAGNOSIS — S065X0D Traumatic subdural hemorrhage without loss of consciousness, subsequent encounter: Secondary | ICD-10-CM | POA: Diagnosis not present

## 2022-04-20 DIAGNOSIS — R279 Unspecified lack of coordination: Secondary | ICD-10-CM | POA: Diagnosis not present

## 2022-04-20 DIAGNOSIS — R2689 Other abnormalities of gait and mobility: Secondary | ICD-10-CM | POA: Diagnosis not present

## 2022-04-20 DIAGNOSIS — R2681 Unsteadiness on feet: Secondary | ICD-10-CM | POA: Diagnosis not present

## 2022-04-21 DIAGNOSIS — R2681 Unsteadiness on feet: Secondary | ICD-10-CM | POA: Diagnosis not present

## 2022-04-21 DIAGNOSIS — R2689 Other abnormalities of gait and mobility: Secondary | ICD-10-CM | POA: Diagnosis not present

## 2022-04-21 DIAGNOSIS — S065X0D Traumatic subdural hemorrhage without loss of consciousness, subsequent encounter: Secondary | ICD-10-CM | POA: Diagnosis not present

## 2022-04-21 DIAGNOSIS — R279 Unspecified lack of coordination: Secondary | ICD-10-CM | POA: Diagnosis not present

## 2022-04-22 DIAGNOSIS — R2689 Other abnormalities of gait and mobility: Secondary | ICD-10-CM | POA: Diagnosis not present

## 2022-04-22 DIAGNOSIS — S065X0D Traumatic subdural hemorrhage without loss of consciousness, subsequent encounter: Secondary | ICD-10-CM | POA: Diagnosis not present

## 2022-04-22 DIAGNOSIS — R279 Unspecified lack of coordination: Secondary | ICD-10-CM | POA: Diagnosis not present

## 2022-04-22 DIAGNOSIS — R2681 Unsteadiness on feet: Secondary | ICD-10-CM | POA: Diagnosis not present

## 2022-04-25 DIAGNOSIS — R2689 Other abnormalities of gait and mobility: Secondary | ICD-10-CM | POA: Diagnosis not present

## 2022-04-25 DIAGNOSIS — R2681 Unsteadiness on feet: Secondary | ICD-10-CM | POA: Diagnosis not present

## 2022-04-25 DIAGNOSIS — R279 Unspecified lack of coordination: Secondary | ICD-10-CM | POA: Diagnosis not present

## 2022-04-25 DIAGNOSIS — S065X0D Traumatic subdural hemorrhage without loss of consciousness, subsequent encounter: Secondary | ICD-10-CM | POA: Diagnosis not present

## 2022-04-26 DIAGNOSIS — R2681 Unsteadiness on feet: Secondary | ICD-10-CM | POA: Diagnosis not present

## 2022-04-26 DIAGNOSIS — R279 Unspecified lack of coordination: Secondary | ICD-10-CM | POA: Diagnosis not present

## 2022-04-26 DIAGNOSIS — R2689 Other abnormalities of gait and mobility: Secondary | ICD-10-CM | POA: Diagnosis not present

## 2022-04-26 DIAGNOSIS — S065X0D Traumatic subdural hemorrhage without loss of consciousness, subsequent encounter: Secondary | ICD-10-CM | POA: Diagnosis not present

## 2022-04-27 DIAGNOSIS — S065X0D Traumatic subdural hemorrhage without loss of consciousness, subsequent encounter: Secondary | ICD-10-CM | POA: Diagnosis not present

## 2022-04-27 DIAGNOSIS — R279 Unspecified lack of coordination: Secondary | ICD-10-CM | POA: Diagnosis not present

## 2022-04-27 DIAGNOSIS — R2689 Other abnormalities of gait and mobility: Secondary | ICD-10-CM | POA: Diagnosis not present

## 2022-04-27 DIAGNOSIS — R2681 Unsteadiness on feet: Secondary | ICD-10-CM | POA: Diagnosis not present

## 2022-04-28 DIAGNOSIS — R2689 Other abnormalities of gait and mobility: Secondary | ICD-10-CM | POA: Diagnosis not present

## 2022-04-28 DIAGNOSIS — S065X0D Traumatic subdural hemorrhage without loss of consciousness, subsequent encounter: Secondary | ICD-10-CM | POA: Diagnosis not present

## 2022-04-28 DIAGNOSIS — R279 Unspecified lack of coordination: Secondary | ICD-10-CM | POA: Diagnosis not present

## 2022-04-28 DIAGNOSIS — R2681 Unsteadiness on feet: Secondary | ICD-10-CM | POA: Diagnosis not present

## 2022-04-29 DIAGNOSIS — S065X0D Traumatic subdural hemorrhage without loss of consciousness, subsequent encounter: Secondary | ICD-10-CM | POA: Diagnosis not present

## 2022-04-29 DIAGNOSIS — R2689 Other abnormalities of gait and mobility: Secondary | ICD-10-CM | POA: Diagnosis not present

## 2022-04-29 DIAGNOSIS — R2681 Unsteadiness on feet: Secondary | ICD-10-CM | POA: Diagnosis not present

## 2022-04-29 DIAGNOSIS — R279 Unspecified lack of coordination: Secondary | ICD-10-CM | POA: Diagnosis not present

## 2022-05-02 DIAGNOSIS — R2681 Unsteadiness on feet: Secondary | ICD-10-CM | POA: Diagnosis not present

## 2022-05-02 DIAGNOSIS — R2689 Other abnormalities of gait and mobility: Secondary | ICD-10-CM | POA: Diagnosis not present

## 2022-05-02 DIAGNOSIS — R279 Unspecified lack of coordination: Secondary | ICD-10-CM | POA: Diagnosis not present

## 2022-05-02 DIAGNOSIS — S065X0D Traumatic subdural hemorrhage without loss of consciousness, subsequent encounter: Secondary | ICD-10-CM | POA: Diagnosis not present

## 2022-05-03 DIAGNOSIS — R2689 Other abnormalities of gait and mobility: Secondary | ICD-10-CM | POA: Diagnosis not present

## 2022-05-03 DIAGNOSIS — R279 Unspecified lack of coordination: Secondary | ICD-10-CM | POA: Diagnosis not present

## 2022-05-03 DIAGNOSIS — S065X0D Traumatic subdural hemorrhage without loss of consciousness, subsequent encounter: Secondary | ICD-10-CM | POA: Diagnosis not present

## 2022-05-03 DIAGNOSIS — R2681 Unsteadiness on feet: Secondary | ICD-10-CM | POA: Diagnosis not present

## 2022-05-04 ENCOUNTER — Inpatient Hospital Stay: Payer: Medicare PPO | Admitting: Neurology

## 2022-05-04 DIAGNOSIS — R2689 Other abnormalities of gait and mobility: Secondary | ICD-10-CM | POA: Diagnosis not present

## 2022-05-04 DIAGNOSIS — R2681 Unsteadiness on feet: Secondary | ICD-10-CM | POA: Diagnosis not present

## 2022-05-04 DIAGNOSIS — Z9181 History of falling: Secondary | ICD-10-CM | POA: Diagnosis not present

## 2022-05-04 DIAGNOSIS — H1131 Conjunctival hemorrhage, right eye: Secondary | ICD-10-CM | POA: Diagnosis not present

## 2022-05-04 DIAGNOSIS — R52 Pain, unspecified: Secondary | ICD-10-CM | POA: Diagnosis not present

## 2022-05-04 DIAGNOSIS — W19XXXA Unspecified fall, initial encounter: Secondary | ICD-10-CM | POA: Diagnosis not present

## 2022-05-04 DIAGNOSIS — R279 Unspecified lack of coordination: Secondary | ICD-10-CM | POA: Diagnosis not present

## 2022-05-04 DIAGNOSIS — S065X0D Traumatic subdural hemorrhage without loss of consciousness, subsequent encounter: Secondary | ICD-10-CM | POA: Diagnosis not present

## 2022-05-04 DIAGNOSIS — M6281 Muscle weakness (generalized): Secondary | ICD-10-CM | POA: Diagnosis not present

## 2022-05-05 DIAGNOSIS — R2681 Unsteadiness on feet: Secondary | ICD-10-CM | POA: Diagnosis not present

## 2022-05-05 DIAGNOSIS — S065X0D Traumatic subdural hemorrhage without loss of consciousness, subsequent encounter: Secondary | ICD-10-CM | POA: Diagnosis not present

## 2022-05-05 DIAGNOSIS — R2689 Other abnormalities of gait and mobility: Secondary | ICD-10-CM | POA: Diagnosis not present

## 2022-05-05 DIAGNOSIS — R279 Unspecified lack of coordination: Secondary | ICD-10-CM | POA: Diagnosis not present

## 2022-05-06 DIAGNOSIS — R279 Unspecified lack of coordination: Secondary | ICD-10-CM | POA: Diagnosis not present

## 2022-05-06 DIAGNOSIS — R2681 Unsteadiness on feet: Secondary | ICD-10-CM | POA: Diagnosis not present

## 2022-05-06 DIAGNOSIS — S065X0D Traumatic subdural hemorrhage without loss of consciousness, subsequent encounter: Secondary | ICD-10-CM | POA: Diagnosis not present

## 2022-05-06 DIAGNOSIS — R2689 Other abnormalities of gait and mobility: Secondary | ICD-10-CM | POA: Diagnosis not present

## 2022-05-09 DIAGNOSIS — R2689 Other abnormalities of gait and mobility: Secondary | ICD-10-CM | POA: Diagnosis not present

## 2022-05-09 DIAGNOSIS — R2681 Unsteadiness on feet: Secondary | ICD-10-CM | POA: Diagnosis not present

## 2022-05-09 DIAGNOSIS — R279 Unspecified lack of coordination: Secondary | ICD-10-CM | POA: Diagnosis not present

## 2022-05-09 DIAGNOSIS — S065X0D Traumatic subdural hemorrhage without loss of consciousness, subsequent encounter: Secondary | ICD-10-CM | POA: Diagnosis not present

## 2022-05-10 DIAGNOSIS — R279 Unspecified lack of coordination: Secondary | ICD-10-CM | POA: Diagnosis not present

## 2022-05-10 DIAGNOSIS — S065X0D Traumatic subdural hemorrhage without loss of consciousness, subsequent encounter: Secondary | ICD-10-CM | POA: Diagnosis not present

## 2022-05-10 DIAGNOSIS — R2681 Unsteadiness on feet: Secondary | ICD-10-CM | POA: Diagnosis not present

## 2022-05-10 DIAGNOSIS — R2689 Other abnormalities of gait and mobility: Secondary | ICD-10-CM | POA: Diagnosis not present

## 2022-05-11 DIAGNOSIS — R2681 Unsteadiness on feet: Secondary | ICD-10-CM | POA: Diagnosis not present

## 2022-05-11 DIAGNOSIS — R2689 Other abnormalities of gait and mobility: Secondary | ICD-10-CM | POA: Diagnosis not present

## 2022-05-11 DIAGNOSIS — S065X0D Traumatic subdural hemorrhage without loss of consciousness, subsequent encounter: Secondary | ICD-10-CM | POA: Diagnosis not present

## 2022-05-11 DIAGNOSIS — R279 Unspecified lack of coordination: Secondary | ICD-10-CM | POA: Diagnosis not present

## 2022-05-12 DIAGNOSIS — R2681 Unsteadiness on feet: Secondary | ICD-10-CM | POA: Diagnosis not present

## 2022-05-12 DIAGNOSIS — R2689 Other abnormalities of gait and mobility: Secondary | ICD-10-CM | POA: Diagnosis not present

## 2022-05-12 DIAGNOSIS — R279 Unspecified lack of coordination: Secondary | ICD-10-CM | POA: Diagnosis not present

## 2022-05-12 DIAGNOSIS — S065X0D Traumatic subdural hemorrhage without loss of consciousness, subsequent encounter: Secondary | ICD-10-CM | POA: Diagnosis not present

## 2022-05-13 DIAGNOSIS — S065X0D Traumatic subdural hemorrhage without loss of consciousness, subsequent encounter: Secondary | ICD-10-CM | POA: Diagnosis not present

## 2022-05-13 DIAGNOSIS — R279 Unspecified lack of coordination: Secondary | ICD-10-CM | POA: Diagnosis not present

## 2022-05-18 ENCOUNTER — Ambulatory Visit: Payer: Medicare PPO | Admitting: Neurology

## 2022-05-18 ENCOUNTER — Encounter: Payer: Self-pay | Admitting: Neurology

## 2022-05-18 VITALS — BP 136/74 | HR 98

## 2022-05-18 DIAGNOSIS — F01C0 Vascular dementia, severe, without behavioral disturbance, psychotic disturbance, mood disturbance, and anxiety: Secondary | ICD-10-CM

## 2022-05-18 DIAGNOSIS — E854 Organ-limited amyloidosis: Secondary | ICD-10-CM | POA: Diagnosis not present

## 2022-05-18 DIAGNOSIS — I613 Nontraumatic intracerebral hemorrhage in brain stem: Secondary | ICD-10-CM

## 2022-05-18 DIAGNOSIS — I680 Cerebral amyloid angiopathy: Secondary | ICD-10-CM | POA: Diagnosis not present

## 2022-05-18 MED ORDER — MEMANTINE HCL 10 MG PO TABS: 10.0000 mg | ORAL_TABLET | Freq: Two times a day (BID) | ORAL | 3 refills | Status: AC

## 2022-05-18 MED ORDER — MEMANTINE HCL 28 X 5 MG & 21 X 10 MG PO TABS: ORAL_TABLET | ORAL | 12 refills | Status: DC

## 2022-05-18 NOTE — Progress Notes (Signed)
[facility]  PATIENT: Cynthia [name] DOB: [birth year]   REASON FOR VISIT: Follow-up for complaints of numbness left side thought to be  partial seizures with history of intracerebral hemorrhage in July 2017 HISTORY FROM: Patient    HISTORY OF PRESENT ILLNESS:UPDATE 9/30/2019CM [name], 76 year old female returns for follow-up with history of intracranial hemorrhage in July 2017.  She is also had episodes suggestive of simple partial seizures none recently.  She is currently on Keppra 500 mg XR daily.  She is on aspirin for secondary stroke prevention without recurrent stroke or TIA symptoms.  She has no bruising and no bleeding.  Blood pressure well controlled in the office today at 123/68.  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  She has a lot of anxiety related to an alcoholic husband.  She has Xanax to take as needed.  She exercises maybe 3 times a week.  She has stopped her yoga class was encouraged to get back into that.  She complains of decreased energy level.  She denies any visual difficulty speech or swallowing problems balance issues or falls.  No interval medical issues.  She returns for reevaluation   UPDATE 09/27/2018CM [name], 76 year old female returns for follow-up with a history of intracranial hemorrhage in July 2017. She also has a history of episodes suggestive of simple partial seizures and was started on Keppra 500 mg daily. She has not had further episodes.She denies side effects to the medication. She remains on aspirin for secondary stroke prevention without recurrent stroke or TIA symptoms. She has minimal bruising and no bleeding.Blood pressure in the office today 129/71. She continues to teach part time. She denies any speech disturbance swallowing problems, visual difficulties, weakness, balance issues or falls. She returns for reevaluation  UPDATE 03/27/2018CM [name], 76 year old female returns for follow-up with a history  of intracranial hemorrhage July 2017. When last seen she was having episodes of numbness left side lasting 5-10 minutes. EEG was ordered showing focal slowing on the right side which is to be expected given her previous intracerebral hemorrhage. She had another episode of transient paresthesias starting in the left shoulder and spreading gradually down into the fingertips of the left hand over   a few minutes. These episodes are suggestive of simple partial seizure. She was started on Keppra after her EEG was performed , she is currently taking Keppra XR 500 mg daily even though the EEG was negative for seizures. She has had one similar episode since it lasted several minutes shortly after starting the [place]. She remains on aspirin secondary stroke prevention. She denies any weakness speech disturbance swallowing problems visual difficulties and falls or balance issues. 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, 76 year old female returns for follow-up with new complaints of numbness left side lasting 5-10 minutes. She has had 2 of these events in the last 2 months. She had hospital admission in July 2017 for intracranial hemorrhage. Follow-up angiogram in November revealed no AV malformation or aneurysms. Her blood pressure does fluctuate however is well controlled in the office today at 122/64. She has had episodes where her left arm from her shoulder to her hand became numb and tingling however she denied any weakness with this ,one time her face was involved there again numbness but no weakness. She denies any speech disturbance, swallowing problems visual difficulty, problems with balance or falls or other associated symptoms. She denies headache. Her aspirin 0.81 was  restarted by her primary care physician on 09/22/2016. 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 76 year old right-handed woman who presented to the [facility] Emergency department after being sent from her doctor's office due to concern for possible stroke.The patient  reported that the night of 03/19/2016 (LKW, time unknown) she developed a sense of feeling disoriented and having difficulty speaking. When asked about her speech, she described that she had trouble focusing her thoughts and getting her words out. She reported that her sister also noticed that she seemed slow to respond to questions. She felt as if her speech was somewhat slurred as well. She did not endorse any weakness, numbness, vision changes, difficulty swallowing, or balance impairments. However, she had noticed that she will find her left arm in unusual postures and positions without being aware that it is doing anything. When she looked at it, she was able to control it without a problem and again denied any weakness or sensory changes. She  also had a headache for the past 2 days.  She denies any similar previous episodes. She denied any recent trauma to the head. She did note that she was working with her brother at her home and had been up on a ladder. She was getting off the ladder, she thought she was on the ground but instead was on the second step of the latter when she stumbled backwards. She says that she struck her bottom on a nearby chair, then fell to the floor landing on her bottom. She did not strike her head. She has not had any recent illness. She does not use daily antiplatelet therapy or anticoagulation. She uses occasional Advil for headache and pain and has taken a total of six 200 mg tablets since last night. She also reports that she took a baby aspirin earlier today as recommended by her sister because of her symptoms.CT brain on admission showed a large right frontal ICH. Patient was not administered IV t-PA  secondary to [place]. She was admitted to the neuro ICU for further evaluation and treatment. Her blood pressure was tightly controlled initially with IV drip and subsequently with oral medications. Follow-up CT scan showed stable appearance of the hemorrhage. There was mild cytotoxic edema but no hydrocephalus or any hemorrhage expansion. CT angiogram and CT venogram did not show any aneurysms or venous sinus thrombosis but there were engorged vessels in the region of the hemorrhage raising concern about a small AVM hence cerebral catheter angiogram was performed and findings of which are also indeterminate and showed abnormal right frontopolar branches .She had persistent left-sided weakness but her speech and word finding difficulties improved. She was transferred to the neurology floor in a condition remained stable. She was seen by physical occupational therapist and felt to be a good candidate for inpatient rehabilitation. She was accepted for transfer to rehabilitation in stable condition. She states she's done well since discharge. She has patient outpatient physical and occupational therapy. She is back to her baseline and has no complaints or deficits from her intracerebral hemorrhage. She does feel she gets tired easily and at times her left-sided balance and coordination may be off. She states her blood pressure is well controlled and today it is 137/87 in office. She is wondering if she needs follow-up imaging studies. She has no complaints  Update 06/17/2019 : She returns for follow-up after last visit a year ago with Cecille Rubin, nurse practitioner.  She states she is doing well and blood pressure is well controlled.  She had one brief episode of tingling in her left calf a few days ago.  It stayed localized and did not progress up to involve the rest of the left leg or the upper extremity.  She states a few days ago she had somewhat similar minor episode as well.  She does complain of having cramps in  her legs and thinks it may be due to dehydration as she is not drinking enough fluids.  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  She is tolerating Keppra XR 500 mg once daily very well without any side effects.  No other new complaints. Update 10/22/2020: She returns for follow-up after last visit in October 2020.  She is doing well she has had no episodes of paresthesias since starting Keppra in February 2018.  She has been diagnosed with intraductal breast cancer and underwent lumpectomy in December last year and is currently finishing her course of radiation.  She complains of feeling tired and fatigued wonders if this is from her stroke.  She is tolerating Keppra well without any side effects.  Her blood pressures well controlled and today it is 138/80.  She has no other new new neurological complaints.  She is wondering if she can stop Keppra and if she needs it since she has been episode free for 4 years  Update 02/22/2021 : She returns for follow-up after last visit 4 months ago.  She has reduced the Keppra initially to 250 twice daily and now has been taking it once a day for the last 6 weeks and has not not had any episodes of numbness or seizure-like episodes.  She has finished her lumpectomy for breast cancer as well as 4 weeks of radiation therapy and an upcoming appointment to see oncologist Dr. Jana Hakim in a few weeks.  She had EEG done on 11/17/2020 which was normal.  She has no complaints except her left foot little toe is swollen and red since this morning. Update 06/16/2021 : She returns for follow-up after last visit 4 months ago.  She is now been off Keppra for several months.  She has had no recurrent numbness or seizure-like episodes.  She states she had 5-6 tick bites earlier this  year in June and did not realize it.  She started having low-grade fever, excessive sleeping and tiredness.  She was eventually diagnosed with Lyme's disease after the blood test was done in August by her primary physician.  She was also having cognitive issues.  She is was tremulous and also off balance.  She has now finished a course of antibiotics doxycycline and started feeling better.  Blood pressure is usually better at home though today it is elevated in office at 157/86.  She has no other new neurological complaints. Update 05/18/2022 : She returns for follow-up after last visit 9 months ago.  She is accompanied by her son.  She was hospitalized in May 2023 with a recurrent right frontal intracerebral hemorrhage.  She was admitted on 01/10/2022 acute onset left-sided weakness she slid out of bed.  Code stroke was called.  The head showed acute right frontal parasagittal intracerebral hemorrhage.  NIH stroke scale was 3.  She had predominant left lower extremity weakness.  MRI scan showed stable right frontoparietal epidural hematoma with mild cytotoxic edema with no intraventricular extension and midline shift.  Old hemorrhagic right frontal infarct was noted as well.  Carotid ultrasound was unremarkable.  2D echo showed normal ejection fraction without cardiac source of embolism.  LDL cholesterol 94 mg percent.  Hemoglobin A1c is 5.7.  Possible seizure activity.  Spot EEG showed evidence of right hemispheric cortical dysfunction and overnight EEG with video showed seizures average 2 to 4 hours lasting 1 to 2 minutes arising from the right frontotemporal region.  Patient was started on Keppra for seizure prophylaxis.  He was transferred to [facility] skilled nursing facility for rehab.  The patient's son has noted that there has been cognitive decline following the second hemorrhage though they have noticed some mild improvement after the first hemorrhage also.  Unable to get up and walk by herself but needs  help.  She is working with physical therapy and is able to take a few steps using a walker but somebody takes very close to her.  She has slowed down a lot and requiring a lot of help with most activities of daily living.  She is tolerating Keppra well and has not had any recurrent seizures.  CT angiogram of the brain on 03/10/2021 and previously July 2017 were both unremarkable without any vascular abnormalities, AVMs or aneurysms REVIEW OF SYSTEMS: Full 14 system review of systems performed and notable only for those listed, all others are neg:  Tingling, numbness, fatigue, cognitive difficulties, tremulousness, radiation for breast cancer, stress ALLERGIES: Allergies  Allergen Reactions   Other Anaphylaxis    FIRE ANTS Insect stings / bites   Acyclovir And Related Other (See Comments)    Unknown reaction   Codeine Other (See Comments)    Syncope    Penicillins Rash    Has patient had a PCN reaction causing immediate rash, facial/tongue/throat swelling, SOB or lightheadedness with hypotension: Unknown Has patient had a PCN reaction causing severe rash involving mucus membranes or skin necrosis: No Has patient had a PCN reaction that required hospitalization No Has patient had a PCN reaction occurring within the last 10 years: No If all of the above answers are "NO", then may proceed with Cephalosporin use.     HOME MEDICATIONS: Outpatient Medications Prior to Visit  Medication Sig Dispense Refill   acetaminophen (TYLENOL) 500 MG tablet Take 2 tablets (1,000 mg total) by mouth every 6 (six) hours as needed for mild pain, fever or headache.     bacitracin ointment Apply topically daily. 120 g 0   famotidine (PEPCID) 20 MG tablet Take 1 tablet (20 mg total) by mouth 2 (two) times daily. 180 tablet 2   ferrous sulfate 325 (65 FE) MG tablet Take 325 mg by mouth daily.     ibuprofen (ADVIL) 400 MG tablet Take 1 tablet (400 mg total) by mouth every 6 (six) hours as needed for moderate pain or  headache.     levETIRAcetam (KEPPRA) 1000 MG tablet Take 1 tablet (1,000 mg total) by mouth 2 (two) times daily.     lidocaine (LIDODERM) 5 % Place 1 patch onto the skin daily. Remove & Discard patch within 12 hours or as directed by MD 30 patch 0   losartan (COZAAR) 100 MG tablet TAKE 1 TABLET BY MOUTH DAILY FOR BLOOD PRESSURE (Patient taking differently:  Take 100 mg by mouth daily.) 90 tablet 2   tiZANidine (ZANAFLEX) 2 MG tablet Take 1 tablet (2 mg total) by mouth in the morning. 30 tablet 0   tiZANidine (ZANAFLEX) 4 MG tablet Take 1 tablet (4 mg total) by mouth at bedtime. 30 tablet 0   venlafaxine XR (EFFEXOR-XR) 150 MG 24 hr capsule Take 150 mg by mouth daily with breakfast.     vitamin B-12 (CYANOCOBALAMIN) 1000 MCG tablet Take 1,000 mcg by mouth daily.     alendronate (FOSAMAX) 70 MG tablet TAKE 1 TABLET BY MOUTH EVERY 7 DAYS. TAKE WITH FULL GLASS OF WATER ON AN EMPTY STOMACH (Patient not taking: Reported on 05/18/2022) 12 tablet 3   No facility-administered medications prior to visit.    PAST MEDICAL HISTORY: Past Medical History:  Diagnosis Date   Breast cancer ([place])    Colon polyps    Depression    history of   Diverticulitis    GERD (gastroesophageal reflux disease)    Hypertension    Osteoporosis    Seizures ([place])    Stroke ([place])     PAST SURGICAL HISTORY: Past Surgical History:  Procedure Laterality Date   BREAST LUMPECTOMY WITH RADIOACTIVE SEED LOCALIZATION Left 08/28/2020   Procedure: LEFT BREAST LUMPECTOMY WITH RADIOACTIVE SEED LOCALIZATION;  Surgeon: Stark Klein, MD;  Location: [facility];  Service: General;  Laterality: Left;  RNFA   IR GENERIC HISTORICAL  08/05/2016   IR ANGIO VERTEBRAL SEL VERTEBRAL UNI R MOD SED 08/05/2016 Luanne Bras, MD [facility]   IR GENERIC HISTORICAL  08/05/2016   IR ANGIO INTRA EXTRACRAN SEL COM CAROTID INNOMINATE BILAT MOD SED 08/05/2016 Luanne Bras, MD [facility]   IR GENERIC HISTORICAL  08/05/2016   IR ANGIO VERTEBRAL  SEL SUBCLAVIAN INNOMINATE UNI L MOD SED 08/05/2016 Luanne Bras, MD [facility]   MOUTH SURGERY     TONSILLECTOMY     TUBAL LIGATION      FAMILY HISTORY: Family History  Problem Relation Age of Onset   Heart disease Mother    Cancer Mother        lung   Stroke Paternal Grandmother    Stomach cancer Maternal Grandfather     SOCIAL HISTORY: Social History   Socioeconomic History   Marital status: Widowed    Spouse name: Not on file   Number of children: 3   Years of education: Not on file   Highest education level: Not on file  Occupational History    Comment: teacher, retired  Tobacco Use   Smoking status: Never    Passive exposure: Never   Smokeless tobacco: Never  Vaping Use   Vaping Use: Never used  Substance and Sexual Activity   Alcohol use: Yes    Alcohol/week: 1.0 standard drink of alcohol    Types: 1 Glasses of wine per week    Comment: 1-2 wine in evening   Drug use: Not Currently   Sexual activity: Not Currently    Comment: Married since 2005  Other Topics Concern   Not on file  Social History Narrative   Lives with son   Right Handed   Drinks 1-2 cups daily.    Social Determinants of Health   Financial Resource Strain: Low Risk  (03/05/2021)   Overall Financial Resource Strain (CARDIA)    Difficulty of Paying Living Expenses: Not very hard  Food Insecurity: No Food Insecurity (03/05/2021)   Hunger Vital Sign    Worried About Running Out of Food in the Last  Year: Never true    [place] in the Last Year: Never true  Transportation Needs: No Transportation Needs (03/05/2021)   PRAPARE - Hydrologist (Medical): No    Lack of Transportation (Non-Medical): No  Physical Activity: Sufficiently Active (03/05/2021)   Exercise Vital Sign    Days of Exercise per Week: 7 days    Minutes of Exercise per Session: 30 min  Stress: No Stress Concern Present (03/05/2021)   [place]    Feeling of Stress : Not at all  Social Connections: Moderately Isolated (03/05/2021)   Social Connection and Isolation Panel [NHANES]    Frequency of Communication with Friends and Family: More than three times a week    Frequency of Social Gatherings with Friends and Family: More than three times a week    Attends Religious Services: Never    Marine scientist or Organizations: No    Attends Archivist Meetings: Never    Marital Status: Married  Human resources officer Violence: Not At Risk (03/05/2021)   Humiliation, Afraid, Rape, and Kick questionnaire    Fear of Current or Ex-Partner: No    Emotionally Abused: No    Physically Abused: No    Sexually Abused: No     PHYSICAL EXAM  Vitals:   05/18/22 0926  BP: 136/74  Pulse: 98   There is no height or weight on file to calculate BMI.  Generalized: Pleasant frail elderly Caucasian lady in no acute distress  Head: normocephalic and atraumatic,.  Neck: Supple, no carotid bruits  Cardiac: Regular rate rhythm, no murmur  Skin no rash or petechiae Musculoskeletal: No deformity   Neurological examination   Mentation: Alert oriented to time, place, and person. Attention span and concentration diminished. Recent and remote memory poor t.  Follows simple commands speech and language hesitant and nonfluent..  Mini-Mental status exam scored 10/30 with deficits in orientation, attention, calculation, recall, naming, comprehension, reading writing and drawing.  Unable to copy intersecting pentagons or draw clock.  Able to name only 1 animals which can walk on 4 legs.  Cranial nerve II-XII: Funduscopic exam not done .Pupils were equal round reactive to light extraocular movements were full, visual field were full on confrontational test. Facial sensation and strength were normal. hearing was intact to finger rubbing bilaterally. Uvula tongue midline. head turning and shoulder shrug were normal and  symmetric.Tongue protrusion into cheek strength was normal. Motor: normal bulk and tone, full strength in the BUE, BLE, diminished fine finger movements on the left.  Orbits right over left upper extremity. Sensory: Normal strength on the right side.  Spastic left hemiparesis with 4/5 strength in the left upper extremity with weakness of left grip and intrinsic hand muscles.  Grade 1/5 strength in the left lower extremity with severely increased tone plasticity.  Left foot drop. Coordination: finger-nose-finger, heel-to-shin bilaterally, no dysmetria Reflexes: In left upper and lower extremity and 1+ on the right.   Gait and Station: Deferred as patient is in a wheelchair unable to walk without assistance       05/18/2022    9:54 AM  MMSE - Mini Mental State Exam  Orientation to time 1  Orientation to Place 2  Registration 3  Attention/ Calculation 0  Recall 0  Language- name 2 objects 1  Language- repeat 1  Language- follow 3 step command 2  Language- read & follow direction 0  Write  a sentence 0  Copy design 0  Total score 10    DIAGNOSTIC DATA (LABS, IMAGING, TESTING) - I reviewed patient records, labs, notes, testing and imaging myself where available.  Lab Results  Component Value Date   WBC 6.7 03/05/2022   HGB 12.1 03/05/2022   HCT 35.4 (L) 03/05/2022   MCV 90.1 03/05/2022   PLT 295 03/05/2022      Component Value Date/Time   NA 141 03/05/2022 0854   K 3.7 03/05/2022 0854   CL 105 03/05/2022 0854   CO2 27 03/05/2022 0854   GLUCOSE 95 03/05/2022 0854   BUN 9 03/05/2022 0854   CREATININE 0.71 03/05/2022 0854   CREATININE 0.96 (H) 03/19/2021 1657   CALCIUM 9.0 03/05/2022 0854   PROT 6.2 (L) 03/04/2022 1906   ALBUMIN 3.8 03/04/2022 1906   AST 23 03/04/2022 1906   AST 20 07/29/2020 1234   ALT 17 03/04/2022 1906   ALT 14 07/29/2020 1234   ALKPHOS 79 03/04/2022 1906   BILITOT 0.4 03/04/2022 1906   BILITOT 0.5 07/29/2020 1234   GFRNONAA >60 03/05/2022 0854    GFRNONAA 59 (L) 03/19/2021 1657   GFRAA 68 03/19/2021 1657   Lab Results  Component Value Date   CHOL 200 01/11/2022   HDL 88 01/11/2022   LDLCALC 94 01/11/2022   TRIG 92 01/11/2022   CHOLHDL 2.3 01/11/2022   Lab Results  Component Value Date   HGBA1C 5.7 (H) 01/11/2022      ASSESSMENT AND PLAN 61 year Caucasian lady with recurrent parenchymal right frontal intracerebral hemorrhage in July 2017  and May 2023 of indeterminate etiology possibly amyloid angiopathy given progressive dementia.  She also had partial seizures which seem well controlled on Keppra  PLAN: I had a long discussion with the patient and her son regarding his recurrent intracerebral hemorrhage and progressive dementia likely from cerebral amyloid angiopathy.  Trial of Namenda starter pack and if tolerated without side effects continue 10 mg twice daily.  Continue Keppra 1 gm twice daily for her seizures which appears stable.  Continue ongoing physical and occupational therapy.  She will continue to need 24 hours nursing care.  Return for follow-up in 3 months or call earlier if necessary. Greater than 50% of time during this prolonged 45 minute visit was spent on counseling,explanation of diagnosis of simple partial seizure, intracerebral hemorrhage, dementia and amyloid angiopathy planning of further management, discussion with patient  and coordination of care for stroke prevention,seizure disorder, discussing seizure precautions, anxiety etc.   Antony Contras, MD  [facility] [address], [place], [place] [address] [phone]

## 2022-05-18 NOTE — Patient Instructions (Addendum)
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  Trial of Namenda starter pack and if tolerated without side effects continue 10 mg twice daily.  Continue Keppra 1 gm twice daily for her seizures which appears stable.  Continue ongoing physical and occupational therapy.  She will continue to need 24 hours nursing care.  Return for follow-up in 3 months or call earlier if necessary.  Vascular Dementia Dementia is a condition in which a person has problems with thinking, memory, and behavior that are severe enough to interfere with daily life. Vascular dementia is a type of dementia. It results from brain damage that is caused by the brain not getting enough blood. This condition may also be called vascular cognitive impairment. What are the causes? Vascular dementia is caused by conditions that reduce blood flow to the brain. Common causes of this condition include: Multiple small strokes. These may happen without symptoms (silent stroke). Major stroke. Damage to small blood vessels in the brain (cerebral small vessel disease). What increases the risk? The following factors may make you more likely to develop this condition: Having had a stroke. Having high blood pressure (hypertension) or high cholesterol. Having a disease that affects the heart or blood vessels. Smoking. Not being active. Being over age 82. Having any of these conditions: Diabetes. Metabolic syndrome. Obesity. Depression. A genetic condition that leads to stroke, such as CADASIL (cerebral autosomal dominant arteriopathy with subcortical infarcts and leukoencephalopathy). What are the signs or symptoms? Symptoms of vascular dementia can vary from one person to another. Symptoms may be mild or severe depending on the amount of damage and which parts of the brain have been affected. Symptoms may begin suddenly  or may develop slowly. Mental symptoms may include: Confusion and memory problems. Poor attention and concentration. Trouble understanding speech. Depression. Personality changes. Trouble recognizing familiar people. Agitation or aggression. Paranoia or false beliefs (delusions). Hallucinations. These involve seeing, hearing, tasting, smelling, or feeling things that are not real. Physical symptoms may include: Weakness. Poor balance. Loss of bladder or bowel control (incontinence). Unsteady walking (gait). Speaking problems. Behavioral symptoms may include: Getting lost in familiar places. Problems with planning and judgment. Trouble following instructions. Social problems. Emotional outbursts. Trouble with daily activities and self-care. Problems handling money. Symptoms may remain stable, or they may get worse over time. Symptoms of vascular dementia may be similar to those of Alzheimer's disease. The two conditions can occur together (mixed dementia). How is this diagnosed? This condition may be diagnosed based on: Your medical history and a physical exam. Symptoms or changes that are reported by friends and family. Lab tests or other tests that check brain and nervous system function. Tests may include: Blood tests. Brain imaging tests. Tests of movement, speech, and other daily activities (neurological exam). Tests of memory, thinking, and problem-solving (neuropsychological or neurocognitive testing). There is not a specific test to diagnose vascular dementia. Diagnosis may involve several specialists. These may include: A health care provider who specializes in the brain and nervous system (neurologist). A health care provider who specializes in understanding how problems in the brain can alter behavior and cognitive function (neuropsychologist). How is this treated? There is no cure for vascular dementia. Brain damage that has already occurred cannot be reversed.  Treatment depends on: How severe the condition is. Which parts of your brain have been affected. Your overall health. Treatment measures aim to: Treat the underlying  cause of vascular dementia and manage risk factors. This may include: Controlling blood pressure or lowering cholesterol. Treating diabetes. Making lifestyle changes, such as quitting smoking or losing weight. Manage symptoms. Prevent further brain damage. Improve the person's health and quality of life. Treatment for dementia may involve a team of health care providers, including: A neurologist. A provider who specializes in disorders of the mind (psychiatrist). A provider who specializes in helping people learn daily living skills (occupational therapist). A provider who focuses on speech and language changes (Electrical engineer). A heart specialist (cardiologist). A provider who helps people learn how to manage physical changes, such as movement and walking (exercise physiologist or physical therapist). Follow these instructions at home: Medicines Take over-the-counter and prescription medicines only as told by your health care provider. Use a pill organizer or pill reminder to help you manage your medicines. Lifestyle Do not use any products that contain nicotine or tobacco, such as cigarettes, e-cigarettes, and chewing tobacco. If you need help quitting, ask your health care provider. Eat a healthy, balanced diet. Maintain a healthy weight, or lose weight if needed. Be physically active as told by your health care provider. General instructions Work with your health care provider to determine what you need help with and what your safety needs are. Follow the health care provider's instructions for treating the condition that caused the dementia. Keep all follow-up visits. This is important. If you are the caregiver:  People with vascular dementia may need regular help at home or daily care from a family member or  home health care worker. Home care for a person with vascular dementia depends on what caused the condition and how severe the symptoms are. General guidelines for caregivers include: Help the person with dementia remember people, appointments, and daily activities. Help the person with dementia manage his or her medicines. Help family and friends learn about ways to communicate with the person with dementia. Create a safe living space to reduce the risk of injury or falls. Find a support group to help caregivers and family cope with the effects of dementia.  Where to find more information [employer] of Neurological Disorders and Stroke: [employer] Contact a health care provider if: New behavioral problems develop. Problems with swallowing develop. Confusion gets worse. Sleepiness gets worse. Get help right away if: Loss of consciousness occurs. There is a sudden loss of speech, balance, or thinking ability. New numbness or paralysis occurs. Sudden, severe headache occurs. Vision is lost or suddenly gets worse in one or both eyes. If you ever feel like the person with dementia may hurt himself or herself or others, or if he or she shares thoughts about taking his or her own life, get help right away. You can go to your nearest emergency department or: Call your local emergency services (911 in the [place] Call a suicide crisis helpline, such as the [facility] at 604-437-0384 or 988 in the [place]. This is open 24 hours a day in the [place] Text the Crisis Text Line at 407 457 3744 (in the [place] Summary Vascular dementia is a type of dementia. It results from brain damage that is caused by the brain not getting enough blood. Vascular dementia is caused by conditions that reduce blood flow to the brain. Common causes of this condition include stroke and damage to small blood vessels in the brain. Treatment focuses on treating the underlying cause of vascular  dementia and managing any risk factors. People with vascular dementia may need regular  help at home or daily care from a family member or home health care worker. Contact a health care provider if you or your caregiver notices any new symptoms. This information is not intended to replace advice given to you by your health care provider. Make sure you discuss any questions you have with your health care provider. Document Revised: 03/24/2021 Document Reviewed: 01/13/2020 Elsevier Patient Education  [place].

## 2022-05-23 DIAGNOSIS — J302 Other seasonal allergic rhinitis: Secondary | ICD-10-CM | POA: Diagnosis not present

## 2022-06-01 ENCOUNTER — Other Ambulatory Visit: Payer: Self-pay | Admitting: Neurology

## 2022-06-03 DIAGNOSIS — F0390 Unspecified dementia without behavioral disturbance: Secondary | ICD-10-CM | POA: Diagnosis not present

## 2022-06-03 DIAGNOSIS — N181 Chronic kidney disease, stage 1: Secondary | ICD-10-CM | POA: Diagnosis not present

## 2022-06-03 DIAGNOSIS — Z79899 Other long term (current) drug therapy: Secondary | ICD-10-CM | POA: Diagnosis not present

## 2022-06-03 DIAGNOSIS — D649 Anemia, unspecified: Secondary | ICD-10-CM | POA: Diagnosis not present

## 2022-06-03 DIAGNOSIS — R519 Headache, unspecified: Secondary | ICD-10-CM | POA: Diagnosis not present

## 2022-06-03 DIAGNOSIS — R5383 Other fatigue: Secondary | ICD-10-CM | POA: Diagnosis not present

## 2022-06-06 DIAGNOSIS — F331 Major depressive disorder, recurrent, moderate: Secondary | ICD-10-CM | POA: Diagnosis not present

## 2022-06-06 DIAGNOSIS — I10 Essential (primary) hypertension: Secondary | ICD-10-CM | POA: Diagnosis not present

## 2022-06-06 DIAGNOSIS — E039 Hypothyroidism, unspecified: Secondary | ICD-10-CM | POA: Diagnosis not present

## 2022-06-06 DIAGNOSIS — Z79899 Other long term (current) drug therapy: Secondary | ICD-10-CM | POA: Diagnosis not present

## 2022-06-07 DIAGNOSIS — F0393 Unspecified dementia, unspecified severity, with mood disturbance: Secondary | ICD-10-CM | POA: Diagnosis not present

## 2022-06-13 DIAGNOSIS — F419 Anxiety disorder, unspecified: Secondary | ICD-10-CM | POA: Diagnosis not present

## 2022-06-13 DIAGNOSIS — F0394 Unspecified dementia, unspecified severity, with anxiety: Secondary | ICD-10-CM | POA: Diagnosis not present

## 2022-07-04 DIAGNOSIS — I69398 Other sequelae of cerebral infarction: Secondary | ICD-10-CM | POA: Diagnosis not present

## 2022-07-04 DIAGNOSIS — F331 Major depressive disorder, recurrent, moderate: Secondary | ICD-10-CM | POA: Diagnosis not present

## 2022-07-04 DIAGNOSIS — F0153 Vascular dementia, unspecified severity, with mood disturbance: Secondary | ICD-10-CM | POA: Diagnosis not present

## 2022-07-07 DIAGNOSIS — M6281 Muscle weakness (generalized): Secondary | ICD-10-CM | POA: Diagnosis not present

## 2022-07-07 DIAGNOSIS — Z6822 Body mass index (BMI) 22.0-22.9, adult: Secondary | ICD-10-CM | POA: Diagnosis not present

## 2022-07-07 DIAGNOSIS — J302 Other seasonal allergic rhinitis: Secondary | ICD-10-CM | POA: Diagnosis not present

## 2022-07-07 DIAGNOSIS — I10 Essential (primary) hypertension: Secondary | ICD-10-CM | POA: Diagnosis not present

## 2022-07-07 DIAGNOSIS — R52 Pain, unspecified: Secondary | ICD-10-CM | POA: Diagnosis not present

## 2022-07-07 DIAGNOSIS — R2689 Other abnormalities of gait and mobility: Secondary | ICD-10-CM | POA: Diagnosis not present

## 2022-07-07 DIAGNOSIS — K219 Gastro-esophageal reflux disease without esophagitis: Secondary | ICD-10-CM | POA: Diagnosis not present

## 2022-07-07 DIAGNOSIS — F0393 Unspecified dementia, unspecified severity, with mood disturbance: Secondary | ICD-10-CM | POA: Diagnosis not present

## 2022-07-07 DIAGNOSIS — Z8669 Personal history of other diseases of the nervous system and sense organs: Secondary | ICD-10-CM | POA: Diagnosis not present

## 2022-07-15 DIAGNOSIS — I619 Nontraumatic intracerebral hemorrhage, unspecified: Secondary | ICD-10-CM | POA: Diagnosis not present

## 2022-07-15 DIAGNOSIS — M6281 Muscle weakness (generalized): Secondary | ICD-10-CM | POA: Diagnosis not present

## 2022-07-21 DIAGNOSIS — I619 Nontraumatic intracerebral hemorrhage, unspecified: Secondary | ICD-10-CM | POA: Diagnosis not present

## 2022-07-21 DIAGNOSIS — M6281 Muscle weakness (generalized): Secondary | ICD-10-CM | POA: Diagnosis not present

## 2022-07-25 DIAGNOSIS — R2689 Other abnormalities of gait and mobility: Secondary | ICD-10-CM | POA: Diagnosis not present

## 2022-07-25 DIAGNOSIS — I619 Nontraumatic intracerebral hemorrhage, unspecified: Secondary | ICD-10-CM | POA: Diagnosis not present

## 2022-07-25 DIAGNOSIS — Z9181 History of falling: Secondary | ICD-10-CM | POA: Diagnosis not present

## 2022-07-25 DIAGNOSIS — F0393 Unspecified dementia, unspecified severity, with mood disturbance: Secondary | ICD-10-CM | POA: Diagnosis not present

## 2022-07-25 DIAGNOSIS — R52 Pain, unspecified: Secondary | ICD-10-CM | POA: Diagnosis not present

## 2022-07-25 DIAGNOSIS — M6281 Muscle weakness (generalized): Secondary | ICD-10-CM | POA: Diagnosis not present

## 2022-07-25 DIAGNOSIS — W19XXXA Unspecified fall, initial encounter: Secondary | ICD-10-CM | POA: Diagnosis not present

## 2022-07-28 ENCOUNTER — Telehealth: Payer: Self-pay | Admitting: Family Medicine

## 2022-07-28 NOTE — Telephone Encounter (Signed)
Spoke to patient's son Catalina Antigua.  He stated patient had a stoke and is in a long term nursing facility

## 2022-07-29 DIAGNOSIS — I619 Nontraumatic intracerebral hemorrhage, unspecified: Secondary | ICD-10-CM | POA: Diagnosis not present

## 2022-07-29 DIAGNOSIS — M6281 Muscle weakness (generalized): Secondary | ICD-10-CM | POA: Diagnosis not present

## 2022-08-01 DIAGNOSIS — F331 Major depressive disorder, recurrent, moderate: Secondary | ICD-10-CM | POA: Diagnosis not present

## 2022-08-01 DIAGNOSIS — M6281 Muscle weakness (generalized): Secondary | ICD-10-CM | POA: Diagnosis not present

## 2022-08-01 DIAGNOSIS — I69398 Other sequelae of cerebral infarction: Secondary | ICD-10-CM | POA: Diagnosis not present

## 2022-08-01 DIAGNOSIS — I619 Nontraumatic intracerebral hemorrhage, unspecified: Secondary | ICD-10-CM | POA: Diagnosis not present

## 2022-08-01 DIAGNOSIS — F0153 Vascular dementia, unspecified severity, with mood disturbance: Secondary | ICD-10-CM | POA: Diagnosis not present

## 2022-08-18 DIAGNOSIS — Z9181 History of falling: Secondary | ICD-10-CM | POA: Diagnosis not present

## 2022-08-18 DIAGNOSIS — M6281 Muscle weakness (generalized): Secondary | ICD-10-CM | POA: Diagnosis not present

## 2022-08-18 DIAGNOSIS — W19XXXA Unspecified fall, initial encounter: Secondary | ICD-10-CM | POA: Diagnosis not present

## 2022-08-18 DIAGNOSIS — R2689 Other abnormalities of gait and mobility: Secondary | ICD-10-CM | POA: Diagnosis not present

## 2022-08-20 DIAGNOSIS — E871 Hypo-osmolality and hyponatremia: Secondary | ICD-10-CM | POA: Diagnosis not present

## 2022-08-20 DIAGNOSIS — N390 Urinary tract infection, site not specified: Secondary | ICD-10-CM | POA: Diagnosis not present

## 2022-08-20 DIAGNOSIS — I619 Nontraumatic intracerebral hemorrhage, unspecified: Secondary | ICD-10-CM | POA: Diagnosis not present

## 2022-08-26 DIAGNOSIS — I680 Cerebral amyloid angiopathy: Secondary | ICD-10-CM | POA: Diagnosis not present

## 2022-08-26 DIAGNOSIS — Z6822 Body mass index (BMI) 22.0-22.9, adult: Secondary | ICD-10-CM | POA: Diagnosis not present

## 2022-08-26 DIAGNOSIS — Z8669 Personal history of other diseases of the nervous system and sense organs: Secondary | ICD-10-CM | POA: Diagnosis not present

## 2022-08-26 DIAGNOSIS — R52 Pain, unspecified: Secondary | ICD-10-CM | POA: Diagnosis not present

## 2022-08-26 DIAGNOSIS — J302 Other seasonal allergic rhinitis: Secondary | ICD-10-CM | POA: Diagnosis not present

## 2022-08-26 DIAGNOSIS — I10 Essential (primary) hypertension: Secondary | ICD-10-CM | POA: Diagnosis not present

## 2022-08-26 DIAGNOSIS — F0393 Unspecified dementia, unspecified severity, with mood disturbance: Secondary | ICD-10-CM | POA: Diagnosis not present

## 2022-08-26 DIAGNOSIS — K219 Gastro-esophageal reflux disease without esophagitis: Secondary | ICD-10-CM | POA: Diagnosis not present

## 2022-09-08 ENCOUNTER — Ambulatory Visit: Payer: Medicare PPO | Admitting: Neurology

## 2022-09-08 ENCOUNTER — Encounter: Payer: Self-pay | Admitting: Neurology

## 2022-09-08 VITALS — BP 153/88 | HR 97

## 2022-09-08 DIAGNOSIS — G40209 Localization-related (focal) (partial) symptomatic epilepsy and epileptic syndromes with complex partial seizures, not intractable, without status epilepticus: Secondary | ICD-10-CM

## 2022-09-08 DIAGNOSIS — S06360A Traumatic hemorrhage of cerebrum, unspecified, without loss of consciousness, initial encounter: Secondary | ICD-10-CM | POA: Diagnosis not present

## 2022-09-08 DIAGNOSIS — F01B0 Vascular dementia, moderate, without behavioral disturbance, psychotic disturbance, mood disturbance, and anxiety: Secondary | ICD-10-CM

## 2022-09-08 NOTE — Patient Instructions (Signed)
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  Continue  Namenda  10 mg twice daily.  Continue Keppra 1 gm twice daily for her seizures which appears stable.  Continue ongoing  24 hours nursing care.  Return for follow-up in 6 months with nurse practitioner or call earlier if necessary

## 2022-09-08 NOTE — Progress Notes (Signed)
[facility]  PATIENT: Cynthia [name] DOB: [birth year]   REASON FOR VISIT: Follow-up for complaints of numbness left side thought to be  partial seizures with history of intracerebral hemorrhage in July 2017 HISTORY FROM: Patient    HISTORY OF PRESENT ILLNESS:UPDATE 9/30/2019CM Cynthia [name], 76 year old female returns for follow-up with history of intracranial hemorrhage in July 2017.  She is also had episodes suggestive of simple partial seizures none recently.  She is currently on Keppra 500 mg XR daily.  She is on aspirin for secondary stroke prevention without recurrent stroke or TIA symptoms.  She has no bruising and no bleeding.  Blood pressure well controlled in the office today at 123/68.  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  She has a lot of anxiety related to an alcoholic husband.  She has Xanax to take as needed.  She exercises maybe 3 times a week.  She has stopped her yoga class was encouraged to get back into that.  She complains of decreased energy level.  She denies any visual difficulty speech or swallowing problems balance issues or falls.  No interval medical issues.  She returns for reevaluation   UPDATE 09/27/2018CM Cynthia [name], 76 year old female returns for follow-up with a history of intracranial hemorrhage in July 2017. She also has a history of episodes suggestive of simple partial seizures and was started on Keppra 500 mg daily. She has not had further episodes.She denies side effects to the medication. She remains on aspirin for secondary stroke prevention without recurrent stroke or TIA symptoms. She has minimal bruising and no bleeding.Blood pressure in the office today 129/71. She continues to teach part time. She denies any speech disturbance swallowing problems, visual difficulties, weakness, balance issues or falls. She returns for reevaluation  UPDATE 03/27/2018CM Cynthia [name], 76 year old female returns for follow-up with a history  of intracranial hemorrhage July 2017. When last seen she was having episodes of numbness left side lasting 5-10 minutes. EEG was ordered showing focal slowing on the right side which is to be expected given her previous intracerebral hemorrhage. She had another episode of transient paresthesias starting in the left shoulder and spreading gradually down into the fingertips of the left hand over   a few minutes. These episodes are suggestive of simple partial seizure. She was started on Keppra after her EEG was performed , she is currently taking Keppra XR 500 mg daily even though the EEG was negative for seizures. She has had one similar episode since it lasted several minutes shortly after starting the [place]. She remains on aspirin secondary stroke prevention. She denies any weakness speech disturbance swallowing problems visual difficulties and falls or balance issues. 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 She has had 2 of these events in the last 2 months. She had hospital admission in July 2017 for intracranial hemorrhage. Follow-up angiogram in November revealed no AV malformation or aneurysms. Her blood pressure does fluctuate however is well controlled in the office today at 122/64. She has had episodes where her left arm from her shoulder to her hand became numb and tingling however she denied any weakness with this ,one time her face was involved there again numbness but no weakness. She denies any speech disturbance, swallowing problems visual difficulty, problems with balance or falls or other associated symptoms. She denies headache. Her aspirin 0.81 was  restarted by her primary care physician on 09/22/2016. 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 76 year old right-handed woman who presented to the [facility] Emergency department after being sent from her doctor's office due to concern for possible stroke.The patient  reported that the night of 03/19/2016 (LKW, time unknown) she developed a sense of feeling disoriented and having difficulty speaking. When asked about her speech, she described that she had trouble focusing her thoughts and getting her words out. She reported that her sister also noticed that she seemed slow to respond to questions. She felt as if her speech was somewhat slurred as well. She did not endorse any weakness, numbness, vision changes, difficulty swallowing, or balance impairments. However, she had noticed that she will find her left arm in unusual postures and positions without being aware that it is doing anything. When she looked at it, she was able to control it without a problem and again denied any weakness or sensory changes. She  also had a headache for the past 2 days.  She denies any similar previous episodes. She denied any recent trauma to the head. She did note that she was working with her brother at her home and had been up on a ladder. She was getting off the ladder, she thought she was on the ground but instead was on the second step of the latter when she stumbled backwards. She says that she struck her bottom on a nearby chair, then fell to the floor landing on her bottom. She did not strike her head. She has not had any recent illness. She does not use daily antiplatelet therapy or anticoagulation. She uses occasional Advil for headache and pain and has taken a total of six 200 mg tablets since last night. She also reports that she took a baby aspirin earlier today as recommended by her sister because of her symptoms.CT brain on admission showed a large right frontal ICH. Patient was not administered IV t-PA  secondary to [place]. She was admitted to the neuro ICU for further evaluation and treatment. Her blood pressure was tightly controlled initially with IV drip and subsequently with oral medications. Follow-up CT scan showed stable appearance of the hemorrhage. There was mild cytotoxic edema but no hydrocephalus or any hemorrhage expansion. CT angiogram and CT venogram did not show any aneurysms or venous sinus thrombosis but there were engorged vessels in the region of the hemorrhage raising concern about a small AVM hence cerebral catheter angiogram was performed and findings of which are also indeterminate and showed abnormal right frontopolar branches .She had persistent left-sided weakness but her speech and word finding difficulties improved. She was transferred to the neurology floor in a condition remained stable. She was seen by physical occupational therapist and felt to be a good candidate for inpatient rehabilitation. She was accepted for transfer to rehabilitation in stable condition. She states she's done well since discharge. She has patient outpatient physical and occupational therapy. She is back to her baseline and has no complaints or deficits from her intracerebral hemorrhage. She does feel she gets tired easily and at times her left-sided balance and coordination may be off. She states her blood pressure is well controlled and today it is 137/87 in office. She is wondering if she needs follow-up imaging studies. She has no complaints  Update 06/17/2019 : She returns for follow-up after last visit a year ago with Cynthia [name], nurse practitioner.  She states she is doing well and blood pressure is well controlled.  She had one brief episode of tingling in her left calf a few days ago.  It stayed localized and did not progress up to involve the rest of the left leg or the upper extremity.  She states a few days ago she had somewhat similar minor episode as well.  She does complain of having cramps in  her legs and thinks it may be due to dehydration as she is not drinking enough fluids.  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  She is tolerating Keppra XR 500 mg once daily very well without any side effects.  No other new complaints. Update 10/22/2020: She returns for follow-up after last visit in October 2020.  She is doing well she has had no episodes of paresthesias since starting Keppra in February 2018.  She has been diagnosed with intraductal breast cancer and underwent lumpectomy in December last year and is currently finishing her course of radiation.  She complains of feeling tired and fatigued wonders if this is from her stroke.  She is tolerating Keppra well without any side effects.  Her blood pressures well controlled and today it is 138/80.  She has no other new new neurological complaints.  She is wondering if she can stop Keppra and if she needs it since she has been episode free for 4 years  Update 02/22/2021 : She returns for follow-up after last visit 4 months ago.  She has reduced the Keppra initially to 250 twice daily and now has been taking it once a day for the last 6 weeks and has not not had any episodes of numbness or seizure-like episodes.  She has finished her lumpectomy for breast cancer as well as 4 weeks of radiation therapy and an upcoming appointment to see oncologist Dr. Jana Hakim in a few weeks.  She had EEG done on 11/17/2020 which was normal.  She has no complaints except her left foot little toe is swollen and red since this morning. Update 06/16/2021 : She returns for follow-up after last visit 4 months ago.  She is now been off Keppra for several months.  She has had no recurrent numbness or seizure-like episodes.  She states she had 5-6 tick bites earlier this  year in June and did not realize it.  She started having low-grade fever, excessive sleeping and tiredness.  She was eventually diagnosed with Lyme's disease after the blood test was done in August by her primary physician.  She was also having cognitive issues.  She is was tremulous and also off balance.  She has now finished a course of antibiotics doxycycline and started feeling better.  Blood pressure is usually better at home though today it is elevated in office at 157/86.  She has no other new neurological complaints. Update 05/18/2022 : She returns for follow-up after last visit 9 months ago.  She is accompanied by her son.  She was hospitalized in May 2023 with a recurrent right frontal intracerebral hemorrhage.  She was admitted on 01/10/2022 acute onset left-sided weakness she slid out of bed.  Code stroke was called.  The head showed acute right frontal parasagittal intracerebral hemorrhage.  NIH stroke scale was 3.  She had predominant left lower extremity weakness.  MRI scan showed stable right frontoparietal epidural hematoma with mild cytotoxic edema with no intraventricular extension and midline shift.  Old hemorrhagic right frontal infarct was noted as well.  Carotid ultrasound was unremarkable.  2D echo showed normal ejection fraction without cardiac source of embolism.  LDL cholesterol 94 mg percent.  Hemoglobin A1c is 5.7.  Possible seizure activity.  Spot EEG showed evidence of right hemispheric cortical dysfunction and overnight EEG with video showed seizures average 2 to 4 hours lasting 1 to 2 minutes arising from the right frontotemporal region.  Patient was started on Keppra for seizure prophylaxis.  He was transferred to [facility] skilled nursing facility for rehab.  The patient's son has noted that there has been cognitive decline following the second hemorrhage though they have noticed some mild improvement after the first hemorrhage also.  Unable to get up and walk by herself but needs  help.  She is working with physical therapy and is able to take a few steps using a walker but somebody takes very close to her.  She has slowed down a lot and requiring a lot of help with most activities of daily living.  She is tolerating Keppra well and has not had any recurrent seizures.  CT angiogram of the brain on 03/10/2021 and previously July 2017 were both unremarkable without any vascular abnormalities, AVMs or aneurysms Up-to-date 09/08/2022: She returns for follow-up after last visit 3 months ago.  She is accompanied by her son.  She is presently living at [facility] skilled nursing facility.  She gets 24-hour care.  She continues to have spastic left hemiparesis.  She is mostly in a wheelchair and is not walking.  She is not getting any physical Occupational Therapy any longer due to lack of improvement.  She was started on Namenda at last visit and seems to be tolerating it well.  Patient and son did not report significant improvement however on cognitive testing today she scored 20/30 which is a lot better than 10/30 at last visit.  She denies any delusions, hallucinations, agitation or violent behavior.  She has not had recurrent stroke or TIA symptoms.  She remains on Keppra and is tolerating it well and has not had any breakthrough seizures either. REVIEW OF SYSTEMS: Full 14 system review of systems performed and notable only for those listed, all others are neg:  Tingling, numbness, fatigue, cognitive difficulties, tremulousness, radiation for breast cancer, stress ALLERGIES: Allergies  Allergen Reactions   Other Anaphylaxis    FIRE ANTS Insect stings / bites   Acyclovir And Related Other (See Comments)    Unknown reaction   Codeine Other (See Comments)    Syncope    Penicillins Rash    Has patient had a PCN reaction causing immediate rash, facial/tongue/throat swelling, SOB or lightheadedness with hypotension: Unknown Has patient had a PCN reaction causing severe rash involving  mucus membranes or skin necrosis: No Has patient had a PCN reaction that required hospitalization No Has patient had a PCN reaction occurring within the last 10 years: No If all of the above answers are "NO", then may proceed with Cephalosporin use.     HOME MEDICATIONS: Outpatient Medications Prior to Visit  Medication Sig Dispense Refill   acetaminophen (TYLENOL) 500 MG tablet Take 2 tablets (1,000 mg total) by mouth every 6 (six) hours as needed for mild pain,  fever or headache.     cetirizine (ZYRTEC) 5 MG chewable tablet Chew 5 mg by mouth daily.     famotidine (PEPCID) 20 MG tablet Take 1 tablet (20 mg total) by mouth 2 (two) times daily. 180 tablet 2   ferrous sulfate 325 (65 FE) MG tablet Take 325 mg by mouth daily.     fluticasone (FLONASE) 50 MCG/ACT nasal spray Place 1 spray into both nostrils daily.     ibuprofen (ADVIL) 400 MG tablet Take 1 tablet (400 mg total) by mouth every 6 (six) hours as needed for moderate pain or headache.     levETIRAcetam (KEPPRA) 750 MG tablet Take 750 mg by mouth 2 (two) times daily.     losartan (COZAAR) 100 MG tablet TAKE 1 TABLET BY MOUTH DAILY FOR BLOOD PRESSURE (Patient taking differently: Take 100 mg by mouth daily.) 90 tablet 2   memantine (NAMENDA) 10 MG tablet Take 1 tablet (10 mg total) by mouth 2 (two) times daily. 60 tablet 3   tiZANidine (ZANAFLEX) 2 MG tablet Take 1 tablet (2 mg total) by mouth in the morning. 30 tablet 0   tiZANidine (ZANAFLEX) 4 MG tablet Take 1 tablet (4 mg total) by mouth at bedtime. 30 tablet 0   venlafaxine XR (EFFEXOR-XR) 150 MG 24 hr capsule Take 150 mg by mouth daily with breakfast.     alendronate (FOSAMAX) 70 MG tablet TAKE 1 TABLET BY MOUTH EVERY 7 DAYS. TAKE WITH FULL GLASS OF WATER ON AN EMPTY STOMACH (Patient not taking: Reported on 05/18/2022) 12 tablet 3   bacitracin ointment Apply topically daily. (Patient not taking: Reported on 09/08/2022) 120 g 0   levETIRAcetam (KEPPRA) 1000 MG tablet Take 1 tablet  (1,000 mg total) by mouth 2 (two) times daily. (Patient not taking: Reported on 09/08/2022)     lidocaine (LIDODERM) 5 % Place 1 patch onto the skin daily. Remove & Discard patch within 12 hours or as directed by MD (Patient not taking: Reported on 09/08/2022) 30 patch 0   memantine (NAMENDA TITRATION PAK) tablet pack 5 mg/day for =1 week; 5 mg twice daily for =1 week; 15 mg/day given in 5 mg and 10 mg separated doses for =1 week; then 10 mg twice daily (Patient not taking: Reported on 09/08/2022) 49 tablet 12   vitamin B-12 (CYANOCOBALAMIN) 1000 MCG tablet Take 1,000 mcg by mouth daily. (Patient not taking: Reported on 09/08/2022)     No facility-administered medications prior to visit.    PAST MEDICAL HISTORY: Past Medical History:  Diagnosis Date   Breast cancer ([place])    Colon polyps    Depression    history of   Diverticulitis    GERD (gastroesophageal reflux disease)    Hypertension    Osteoporosis    Seizures ([place])    Stroke ([place])     PAST SURGICAL HISTORY: Past Surgical History:  Procedure Laterality Date   BREAST LUMPECTOMY WITH RADIOACTIVE SEED LOCALIZATION Left 08/28/2020   Procedure: LEFT BREAST LUMPECTOMY WITH RADIOACTIVE SEED LOCALIZATION;  Surgeon: Stark Klein, MD;  Location: [facility];  Service: General;  Laterality: Left;  RNFA   IR GENERIC HISTORICAL  08/05/2016   IR ANGIO VERTEBRAL SEL VERTEBRAL UNI R MOD SED 08/05/2016 Luanne Bras, MD [facility]   IR GENERIC HISTORICAL  08/05/2016   IR ANGIO INTRA EXTRACRAN SEL COM CAROTID INNOMINATE BILAT MOD SED 08/05/2016 Luanne Bras, MD [facility]   IR GENERIC HISTORICAL  08/05/2016   IR ANGIO VERTEBRAL SEL SUBCLAVIAN INNOMINATE UNI  L MOD SED 08/05/2016 Luanne Bras, MD [facility]   MOUTH SURGERY     TONSILLECTOMY     TUBAL LIGATION      FAMILY HISTORY: Family History  Problem Relation Age of Onset   Heart disease Mother    Cancer Mother        lung   Stroke Paternal Grandmother    Stomach  cancer Maternal Grandfather     SOCIAL HISTORY: Social History   Socioeconomic History   Marital status: Widowed    Spouse name: Not on file   Number of children: 3   Years of education: Not on file   Highest education level: Not on file  Occupational History    Comment: teacher, retired  Tobacco Use   Smoking status: Never    Passive exposure: Never   Smokeless tobacco: Never  Vaping Use   Vaping Use: Never used  Substance and Sexual Activity   Alcohol use: Yes    Alcohol/week: 1.0 standard drink of alcohol    Types: 1 Glasses of wine per week    Comment: 1-2 wine in evening   Drug use: Not Currently   Sexual activity: Not Currently    Comment: Married since 2005  Other Topics Concern   Not on file  Social History Narrative   Lives with son   Right Handed   Drinks 1-2 cups daily.    Social Determinants of Health   Financial Resource Strain: Low Risk  (03/05/2021)   Overall Financial Resource Strain (CARDIA)    Difficulty of Paying Living Expenses: Not very hard  Food Insecurity: No Food Insecurity (03/05/2021)   Hunger Vital Sign    Worried About Running Out of Food in the Last Year: Never true    Ran Out of Food in the Last Year: Never true  Transportation Needs: No Transportation Needs (03/05/2021)   PRAPARE - Hydrologist (Medical): No    Lack of Transportation (Non-Medical): No  Physical Activity: Sufficiently Active (03/05/2021)   Exercise Vital Sign    Days of Exercise per Week: 7 days    Minutes of Exercise per Session: 30 min  Stress: No Stress Concern Present (03/05/2021)   [place]    Feeling of Stress : Not at all  Social Connections: Moderately Isolated (03/05/2021)   Social Connection and Isolation Panel [NHANES]    Frequency of Communication with Friends and Family: More than three times a week    Frequency of Social Gatherings with Friends and Family:  More than three times a week    Attends Religious Services: Never    Marine scientist or Organizations: No    Attends Archivist Meetings: Never    Marital Status: Married  Human resources officer Violence: Not At Risk (03/05/2021)   Humiliation, Afraid, Rape, and Kick questionnaire    Fear of Current or Ex-Partner: No    Emotionally Abused: No    Physically Abused: No    Sexually Abused: No     PHYSICAL EXAM  Vitals:   09/08/22 1246  BP: (!) 153/88  Pulse: 97   There is no height or weight on file to calculate BMI.  Generalized: Pleasant frail elderly Caucasian lady in no acute distress  Head: normocephalic and atraumatic,.  Neck: Supple, no carotid bruits  Cardiac: Regular rate rhythm, no murmur  Skin no rash or petechiae Musculoskeletal: No deformity   Neurological examination   Mentation: Alert  oriented to time, place, and person. Attention span and concentration diminished. Recent and remote memory poor t.  Follows simple commands speech and language hesitant and nonfluent..  Mini-Mental status exam scored 20/30 with deficits in orientation, attention, calculation, recall, naming, comprehension, reading writing and drawing.  Unable to copy intersecting pentagons .clock drawing 1/4.  Able to name only 7 animals which can walk on 4 legs.  k.  Able to name only 1 animals which can walk on 4 legs.  Cranial nerve II-XII: Funduscopic exam not done .Pupils were equal round reactive to light extraocular movements were full, visual field were full on confrontational test. Facial sensation and strength were normal. hearing was intact to finger rubbing bilaterally. Uvula tongue midline. head turning and shoulder shrug were normal and symmetric.Tongue protrusion into cheek strength was normal. Motor: normal bulk and tone, full strength in the BUE, BLE, diminished fine finger movements on the left.  Orbits right over left upper extremity. Sensory: Normal strength on the right  side.  Spastic left hemiparesis with 4/5 strength in the left upper extremity with weakness of left grip and intrinsic hand muscles.  Grade 3-4/5 strength in the left lower extremity with severely increased tone plasticity.  Left foot drop. Coordination: finger-nose-finger, heel-to-shin bilaterally, no dysmetria Reflexes: In left upper and lower extremity and 1+ on the right.   Gait and Station: Deferred as patient is in a wheelchair unable to walk without assistance       09/08/2022    1:20 PM 05/18/2022    9:54 AM  MMSE - Mini Mental State Exam  Orientation to time 1 1  Orientation to Place 5 2  Registration 3 3  Attention/ Calculation 2 0  Recall 3 0  Language- name 2 objects 2 1  Language- repeat 1 1  Language- follow 3 step command 1 2  Language- read & follow direction 1 0  Write a sentence 1 0  Copy design 0 0  Total score 20 10     DIAGNOSTIC DATA (LABS, IMAGING, TESTING) - I reviewed patient records, labs, notes, testing and imaging myself where available.  Lab Results  Component Value Date   WBC 6.7 03/05/2022   HGB 12.1 03/05/2022   HCT 35.4 (L) 03/05/2022   MCV 90.1 03/05/2022   PLT 295 03/05/2022      Component Value Date/Time   NA 141 03/05/2022 0854   K 3.7 03/05/2022 0854   CL 105 03/05/2022 0854   CO2 27 03/05/2022 0854   GLUCOSE 95 03/05/2022 0854   BUN 9 03/05/2022 0854   CREATININE 0.71 03/05/2022 0854   CREATININE 0.96 (H) 03/19/2021 1657   CALCIUM 9.0 03/05/2022 0854   PROT 6.2 (L) 03/04/2022 1906   ALBUMIN 3.8 03/04/2022 1906   AST 23 03/04/2022 1906   AST 20 07/29/2020 1234   ALT 17 03/04/2022 1906   ALT 14 07/29/2020 1234   ALKPHOS 79 03/04/2022 1906   BILITOT 0.4 03/04/2022 1906   BILITOT 0.5 07/29/2020 1234   GFRNONAA >60 03/05/2022 0854   GFRNONAA 59 (L) 03/19/2021 1657   GFRAA 68 03/19/2021 1657   Lab Results  Component Value Date   CHOL 200 01/11/2022   HDL 88 01/11/2022   LDLCALC 94 01/11/2022   TRIG 92 01/11/2022   CHOLHDL  2.3 01/11/2022   Lab Results  Component Value Date   HGBA1C 5.7 (H) 01/11/2022      ASSESSMENT AND PLAN 45 year Caucasian lady with recurrent parenchymal right frontal intracerebral hemorrhage  in July 2017  and May 2023 of indeterminate etiology possibly amyloid angiopathy given progressive dementia.  She also had partial seizures which seem well controlled on Keppra  PLAN: I had a long discussion with the patient and her son regarding his recurrent intracerebral hemorrhage and progressive dementia likely from cerebral amyloid angiopathy.  Continue  Namenda  10 mg twice daily.  Continue Keppra 1 gm twice daily for her seizures which appears stable.  Continue ongoing  24 hours nursing care.  Return for follow-up in 6 months with nurse practitioner or call earlier if necessary Greater than 50% of time during this prolonged 35 minute visit was spent on counseling,explanation of diagnosis of simple partial seizure, intracerebral hemorrhage, dementia and amyloid angiopathy planning of further management, discussion with patient  and coordination of care for stroke prevention,seizure disorder, discussing seizure precautions, anxiety etc.   Antony Contras, MD  [facility] [address] [place], [place] [address] [phone]

## 2022-09-09 DIAGNOSIS — M6281 Muscle weakness (generalized): Secondary | ICD-10-CM | POA: Diagnosis not present

## 2022-09-09 DIAGNOSIS — I619 Nontraumatic intracerebral hemorrhage, unspecified: Secondary | ICD-10-CM | POA: Diagnosis not present

## 2022-09-28 ENCOUNTER — Encounter: Payer: Medicare PPO | Admitting: Psychology

## 2022-09-29 DIAGNOSIS — U071 COVID-19: Secondary | ICD-10-CM | POA: Diagnosis not present

## 2022-10-05 ENCOUNTER — Encounter: Payer: Medicare PPO | Admitting: Psychology

## 2022-11-02 DIAGNOSIS — I10 Essential (primary) hypertension: Secondary | ICD-10-CM | POA: Diagnosis not present

## 2022-11-02 DIAGNOSIS — K219 Gastro-esophageal reflux disease without esophagitis: Secondary | ICD-10-CM

## 2022-11-02 DIAGNOSIS — Z8669 Personal history of other diseases of the nervous system and sense organs: Secondary | ICD-10-CM

## 2022-11-02 DIAGNOSIS — J302 Other seasonal allergic rhinitis: Secondary | ICD-10-CM

## 2022-11-02 DIAGNOSIS — F0393 Unspecified dementia, unspecified severity, with mood disturbance: Secondary | ICD-10-CM

## 2022-11-02 DIAGNOSIS — Z6822 Body mass index (BMI) 22.0-22.9, adult: Secondary | ICD-10-CM

## 2022-11-02 DIAGNOSIS — I680 Cerebral amyloid angiopathy: Secondary | ICD-10-CM | POA: Diagnosis not present

## 2022-11-02 DIAGNOSIS — R52 Pain, unspecified: Secondary | ICD-10-CM

## 2023-01-25 ENCOUNTER — Inpatient Hospital Stay (HOSPITAL_COMMUNITY)
Admission: EM | Admit: 2023-01-25 | Discharge: 2023-01-31 | DRG: 064 | Disposition: A | Discharge status: Skilled Nursing Facility | Payer: Medicare PPO | Source: Skilled Nursing Facility | Attending: Internal Medicine | Admitting: Internal Medicine

## 2023-01-25 ENCOUNTER — Emergency Department (HOSPITAL_COMMUNITY): Payer: Medicare PPO

## 2023-01-25 ENCOUNTER — Inpatient Hospital Stay (HOSPITAL_COMMUNITY): Payer: Medicare PPO

## 2023-01-25 DIAGNOSIS — I611 Nontraumatic intracerebral hemorrhage in hemisphere, cortical: Principal | ICD-10-CM | POA: Diagnosis present

## 2023-01-25 DIAGNOSIS — R300 Dysuria: Secondary | ICD-10-CM | POA: Diagnosis present

## 2023-01-25 DIAGNOSIS — I6389 Other cerebral infarction: Secondary | ICD-10-CM | POA: Diagnosis not present

## 2023-01-25 DIAGNOSIS — Z888 Allergy status to other drugs, medicaments and biological substances status: Secondary | ICD-10-CM

## 2023-01-25 DIAGNOSIS — E871 Hypo-osmolality and hyponatremia: Secondary | ICD-10-CM | POA: Diagnosis not present

## 2023-01-25 DIAGNOSIS — Z823 Family history of stroke: Secondary | ICD-10-CM

## 2023-01-25 DIAGNOSIS — E854 Organ-limited amyloidosis: Secondary | ICD-10-CM | POA: Diagnosis present

## 2023-01-25 DIAGNOSIS — F0150 Vascular dementia without behavioral disturbance: Secondary | ICD-10-CM | POA: Diagnosis present

## 2023-01-25 DIAGNOSIS — I10 Essential (primary) hypertension: Secondary | ICD-10-CM | POA: Diagnosis present

## 2023-01-25 DIAGNOSIS — F419 Anxiety disorder, unspecified: Secondary | ICD-10-CM | POA: Diagnosis present

## 2023-01-25 DIAGNOSIS — G40909 Epilepsy, unspecified, not intractable, without status epilepticus: Secondary | ICD-10-CM | POA: Diagnosis present

## 2023-01-25 DIAGNOSIS — E8809 Other disorders of plasma-protein metabolism, not elsewhere classified: Secondary | ICD-10-CM | POA: Diagnosis present

## 2023-01-25 DIAGNOSIS — R7303 Prediabetes: Secondary | ICD-10-CM | POA: Diagnosis present

## 2023-01-25 DIAGNOSIS — Z8249 Family history of ischemic heart disease and other diseases of the circulatory system: Secondary | ICD-10-CM

## 2023-01-25 DIAGNOSIS — Z8673 Personal history of transient ischemic attack (TIA), and cerebral infarction without residual deficits: Secondary | ICD-10-CM

## 2023-01-25 DIAGNOSIS — Z6824 Body mass index (BMI) 24.0-24.9, adult: Secondary | ICD-10-CM

## 2023-01-25 DIAGNOSIS — F32A Depression, unspecified: Secondary | ICD-10-CM | POA: Diagnosis present

## 2023-01-25 DIAGNOSIS — E872 Acidosis, unspecified: Secondary | ICD-10-CM | POA: Diagnosis present

## 2023-01-25 DIAGNOSIS — E785 Hyperlipidemia, unspecified: Secondary | ICD-10-CM | POA: Diagnosis present

## 2023-01-25 DIAGNOSIS — Z800 Family history of malignant neoplasm of digestive organs: Secondary | ICD-10-CM | POA: Diagnosis not present

## 2023-01-25 DIAGNOSIS — M810 Age-related osteoporosis without current pathological fracture: Secondary | ICD-10-CM | POA: Diagnosis present

## 2023-01-25 DIAGNOSIS — Z993 Dependence on wheelchair: Secondary | ICD-10-CM

## 2023-01-25 DIAGNOSIS — Z66 Do not resuscitate: Secondary | ICD-10-CM | POA: Diagnosis not present

## 2023-01-25 DIAGNOSIS — D649 Anemia, unspecified: Secondary | ICD-10-CM | POA: Diagnosis present

## 2023-01-25 DIAGNOSIS — G8194 Hemiplegia, unspecified affecting left nondominant side: Secondary | ICD-10-CM | POA: Diagnosis present

## 2023-01-25 DIAGNOSIS — I619 Nontraumatic intracerebral hemorrhage, unspecified: Secondary | ICD-10-CM | POA: Diagnosis present

## 2023-01-25 DIAGNOSIS — R52 Pain, unspecified: Secondary | ICD-10-CM | POA: Diagnosis not present

## 2023-01-25 DIAGNOSIS — Z853 Personal history of malignant neoplasm of breast: Secondary | ICD-10-CM | POA: Diagnosis not present

## 2023-01-25 DIAGNOSIS — Z880 Allergy status to penicillin: Secondary | ICD-10-CM

## 2023-01-25 DIAGNOSIS — R569 Unspecified convulsions: Secondary | ICD-10-CM | POA: Diagnosis not present

## 2023-01-25 DIAGNOSIS — G936 Cerebral edema: Secondary | ICD-10-CM | POA: Diagnosis present

## 2023-01-25 DIAGNOSIS — I680 Cerebral amyloid angiopathy: Secondary | ICD-10-CM | POA: Diagnosis present

## 2023-01-25 DIAGNOSIS — Z1152 Encounter for screening for COVID-19: Secondary | ICD-10-CM | POA: Diagnosis not present

## 2023-01-25 DIAGNOSIS — R414 Neurologic neglect syndrome: Secondary | ICD-10-CM | POA: Diagnosis present

## 2023-01-25 DIAGNOSIS — Z7189 Other specified counseling: Secondary | ICD-10-CM | POA: Diagnosis not present

## 2023-01-25 DIAGNOSIS — K219 Gastro-esophageal reflux disease without esophagitis: Secondary | ICD-10-CM | POA: Diagnosis present

## 2023-01-25 DIAGNOSIS — R400 Somnolence: Secondary | ICD-10-CM | POA: Diagnosis not present

## 2023-01-25 DIAGNOSIS — Z79899 Other long term (current) drug therapy: Secondary | ICD-10-CM | POA: Diagnosis not present

## 2023-01-25 DIAGNOSIS — R54 Age-related physical debility: Secondary | ICD-10-CM | POA: Diagnosis present

## 2023-01-25 DIAGNOSIS — R519 Headache, unspecified: Secondary | ICD-10-CM | POA: Diagnosis not present

## 2023-01-25 DIAGNOSIS — M6281 Muscle weakness (generalized): Secondary | ICD-10-CM | POA: Diagnosis not present

## 2023-01-25 DIAGNOSIS — Z885 Allergy status to narcotic agent status: Secondary | ICD-10-CM

## 2023-01-25 DIAGNOSIS — Z86010 Personal history of colonic polyps: Secondary | ICD-10-CM

## 2023-01-25 DIAGNOSIS — R739 Hyperglycemia, unspecified: Secondary | ICD-10-CM | POA: Diagnosis present

## 2023-01-25 DIAGNOSIS — I618 Other nontraumatic intracerebral hemorrhage: Secondary | ICD-10-CM | POA: Diagnosis present

## 2023-01-25 LAB — AMMONIA: Ammonia: 20 umol/L (ref 9–35)

## 2023-01-25 LAB — CBC WITH DIFFERENTIAL/PLATELET
Abs Immature Granulocytes: 0.06 10*3/uL (ref 0.00–0.07)
Basophils Absolute: 0 10*3/uL (ref 0.0–0.1)
Basophils Relative: 0 %
Eosinophils Absolute: 0 10*3/uL (ref 0.0–0.5)
Eosinophils Relative: 0 %
HCT: 35.3 % — ABNORMAL LOW (ref 36.0–46.0)
Hemoglobin: 11.8 g/dL — ABNORMAL LOW (ref 12.0–15.0)
Immature Granulocytes: 1 %
Lymphocytes Relative: 23 %
Lymphs Abs: 2.6 10*3/uL (ref 0.7–4.0)
MCH: 30.2 pg (ref 26.0–34.0)
MCHC: 33.4 g/dL (ref 30.0–36.0)
MCV: 90.3 fL (ref 80.0–100.0)
Monocytes Absolute: 1.2 10*3/uL — ABNORMAL HIGH (ref 0.1–1.0)
Monocytes Relative: 11 %
Neutro Abs: 7.6 10*3/uL (ref 1.7–7.7)
Neutrophils Relative %: 65 %
Platelets: 335 10*3/uL (ref 150–400)
RBC: 3.91 MIL/uL (ref 3.87–5.11)
RDW: 12.1 % (ref 11.5–15.5)
WBC: 11.5 10*3/uL — ABNORMAL HIGH (ref 4.0–10.5)
nRBC: 0 % (ref 0.0–0.2)

## 2023-01-25 LAB — SARS CORONAVIRUS 2 BY RT PCR: SARS Coronavirus 2 by RT PCR: NEGATIVE

## 2023-01-25 LAB — COMPREHENSIVE METABOLIC PANEL
ALT: 19 U/L (ref 0–44)
AST: 22 U/L (ref 15–41)
Albumin: 3.4 g/dL — ABNORMAL LOW (ref 3.5–5.0)
Alkaline Phosphatase: 77 U/L (ref 38–126)
Anion gap: 12 (ref 5–15)
BUN: 11 mg/dL (ref 8–23)
CO2: 26 mmol/L (ref 22–32)
Calcium: 9 mg/dL (ref 8.9–10.3)
Chloride: 97 mmol/L — ABNORMAL LOW (ref 98–111)
Creatinine, Ser: 0.73 mg/dL (ref 0.44–1.00)
GFR, Estimated: >60 mL/min (ref >60)
Glucose, Bld: 118 mg/dL — ABNORMAL HIGH (ref 70–99)
Potassium: 3.3 mmol/L — ABNORMAL LOW (ref 3.5–5.1)
Sodium: 135 mmol/L (ref 135–145)
Total Bilirubin: 0.6 mg/dL (ref 0.3–1.2)
Total Protein: 6.4 g/dL — ABNORMAL LOW (ref 6.5–8.1)

## 2023-01-25 LAB — URINALYSIS, W/ REFLEX TO CULTURE (INFECTION SUSPECTED)
Bilirubin Urine: NEGATIVE
Glucose, UA: NEGATIVE mg/dL
Ketones, ur: NEGATIVE mg/dL
Nitrite: POSITIVE — AB
Protein, ur: NEGATIVE mg/dL
Specific Gravity, Urine: 1.004 — ABNORMAL LOW (ref 1.005–1.030)
WBC, UA: >50 WBC/hpf (ref 0–5)
pH: 7 (ref 5.0–8.0)

## 2023-01-25 LAB — MRSA NEXT GEN BY PCR, NASAL: MRSA by PCR Next Gen: NOT DETECTED

## 2023-01-25 LAB — LIPASE, BLOOD: Lipase: 21 U/L (ref 11–51)

## 2023-01-25 MED ORDER — STROKE: EARLY STAGES OF RECOVERY BOOK
Freq: Once | Status: AC
  Filled 2023-01-25: qty 1

## 2023-01-25 MED ORDER — ACETAMINOPHEN 160 MG/5ML PO SOLN: 650.0000 mg | ORAL | Status: DC | PRN

## 2023-01-25 MED ORDER — LEVETIRACETAM IN NACL 1000 MG/100ML IV SOLN
1000.0000 mg | Freq: Two times a day (BID) | INTRAVENOUS | Status: DC
  Administered 2023-01-25 – 2023-01-26 (×2): 1000 mg via INTRAVENOUS
  Filled 2023-01-25 (×2): qty 100

## 2023-01-25 MED ORDER — CHLORHEXIDINE GLUCONATE CLOTH 2 % EX PADS
6.0000 | MEDICATED_PAD | Freq: Every day | CUTANEOUS | Status: DC
  Administered 2023-01-25 – 2023-01-26 (×2): 6 via TOPICAL

## 2023-01-25 MED ORDER — ACETAMINOPHEN 325 MG PO TABS
650.0000 mg | ORAL_TABLET | ORAL | Status: DC | PRN
  Administered 2023-01-26: 650 mg via ORAL
  Filled 2023-01-25: qty 2

## 2023-01-25 MED ORDER — ORAL CARE MOUTH RINSE: 15.0000 mL | OROMUCOSAL | Status: DC | PRN

## 2023-01-25 MED ORDER — PANTOPRAZOLE SODIUM 40 MG IV SOLR
40.0000 mg | Freq: Every day | INTRAVENOUS | Status: DC
  Administered 2023-01-25: 40 mg via INTRAVENOUS
  Filled 2023-01-25: qty 10

## 2023-01-25 MED ORDER — CLEVIDIPINE BUTYRATE 0.5 MG/ML IV EMUL
0.0000 mg/h | INTRAVENOUS | Status: DC
  Administered 2023-01-25 – 2023-01-26 (×2): 2 mg/h via INTRAVENOUS
  Administered 2023-01-26 (×2): 4 mg/h via INTRAVENOUS
  Administered 2023-01-27: 2 mg/h via INTRAVENOUS
  Administered 2023-01-27: 1 mg/h via INTRAVENOUS
  Filled 2023-01-25: qty 50
  Filled 2023-01-25: qty 100
  Filled 2023-01-25 (×4): qty 50

## 2023-01-25 MED ORDER — SENNOSIDES-DOCUSATE SODIUM 8.6-50 MG PO TABS
1.0000 | ORAL_TABLET | Freq: Two times a day (BID) | ORAL | Status: DC
  Administered 2023-01-26 – 2023-01-31 (×10): 1 via ORAL
  Filled 2023-01-25 (×10): qty 1

## 2023-01-25 MED ORDER — ACETAMINOPHEN 650 MG RE SUPP
650.0000 mg | RECTAL | Status: DC | PRN
  Administered 2023-01-25: 650 mg via RECTAL
  Filled 2023-01-25: qty 1

## 2023-01-25 NOTE — ED Notes (Signed)
ED TO INPATIENT HANDOFF REPORT  ED Nurse Name and Phone #: [phone]  S Name/Age/Gender Cynthia [name] 77 y.o. female Room/Bed: 001C/001C  Code Status   Code Status: Full Code  Home/SNF/Other Nursing Home Patient oriented to: self Is this baseline? No   Triage Complete: Triage complete  Chief Complaint ICH (intracerebral hemorrhage) (HCC) [I61.9]  Triage Note Pt coming from [facility], out of stroke window. Facility called out for lethargy. Left sided facial drop. Hx of stroke.  No ability to walk and baseline left hand contracted.    Allergies Allergies  Allergen Reactions   Other Anaphylaxis    FIRE ANTS Insect stings / bites   Acyclovir And Related Other (See Comments)    Unknown reaction   Codeine Other (See Comments)    Syncope    Penicillins Rash    Has patient had a PCN reaction causing immediate rash, facial/tongue/throat swelling, SOB or lightheadedness with hypotension: Unknown Has patient had a PCN reaction causing severe rash involving mucus membranes or skin necrosis: No Has patient had a PCN reaction that required hospitalization No Has patient had a PCN reaction occurring within the last 10 years: No If all of the above answers are "NO", then may proceed with Cephalosporin use.     Level of Care/Admitting Diagnosis ED Disposition     ED Disposition  Admit   Condition  --   Comment  Hospital [facility] [[id number]]  Level of Care: ICU [6]  May admit patient to [facility] or [facility] if equivalent level of care is available:: Yes  Covid Evaluation: Asymptomatic - no recent exposure (last 10 days) testing not required  Diagnosis: ICH (intracerebral hemorrhage) [facility]) [[id number]]  Admitting Physician: Marvel Plan [[id number]]  Attending Physician: Marvel Plan [[id number]]  Certification:: I certify this patient will need inpatient services for at least 2 midnights  Estimated Length of Stay: 4          B Medical/Surgery  History Past Medical History:  Diagnosis Date   Breast cancer (HCC)    Colon polyps    Depression    history of   Diverticulitis    GERD (gastroesophageal reflux disease)    Hypertension    Osteoporosis    Seizures (HCC)    Stroke [facility])    Past Surgical History:  Procedure Laterality Date   BREAST LUMPECTOMY WITH RADIOACTIVE SEED LOCALIZATION Left 08/28/2020   Procedure: LEFT BREAST LUMPECTOMY WITH RADIOACTIVE SEED LOCALIZATION;  Surgeon: Almond Lint, MD;  Location: [facility] OR;  Service: General;  Laterality: Left;  RNFA   IR GENERIC HISTORICAL  08/05/2016   IR ANGIO VERTEBRAL SEL VERTEBRAL UNI R MOD SED 08/05/2016 Julieanne Cotton, MD [facility]-INTER[facility] RAD   IR GENERIC HISTORICAL  08/05/2016   IR ANGIO INTRA EXTRACRAN SEL COM CAROTID INNOMINATE BILAT MOD SED 08/05/2016 Julieanne Cotton, MD [facility]   IR GENERIC HISTORICAL  08/05/2016   IR ANGIO VERTEBRAL SEL SUBCLAVIAN INNOMINATE UNI L MOD SED 08/05/2016 Julieanne Cotton, MD [facility]   MOUTH SURGERY     TONSILLECTOMY     TUBAL LIGATION       A IV Location/Drains/Wounds Patient Lines/Drains/Airways Status     Active Line/Drains/Airways     Name Placement date Placement time Site Days   Peripheral IV 03/04/22 20 G Left Antecubital 03/04/22  1911  Antecubital  327   External Urinary Catheter 03/07/22  1606  --  324   Incision (Closed) 08/28/20 Breast 08/28/20  0834  -- 880  Intake/Output Last 24 hours No intake or output data in the 24 hours ending 01/25/23 1710  Labs/Imaging Results for orders placed or performed during the hospital encounter of 01/25/23 (from the past 48 hour(s))  Comprehensive metabolic panel     Status: Abnormal   Collection Time: 01/25/23  2:24 PM  Result Value Ref Range   Sodium 135 135 - 145 mmol/L   Potassium 3.3 (L) 3.5 - 5.1 mmol/L   Chloride 97 (L) 98 - 111 mmol/L   CO2 26 22 - 32 mmol/L   Glucose, Bld 118 (H) 70 - 99 mg/dL    Comment: Glucose reference range applies  only to samples taken after fasting for at least 8 hours.   BUN 11 8 - 23 mg/dL   Creatinine, Ser 1.61 0.44 - 1.00 mg/dL   Calcium 9.0 8.9 - 09.6 mg/dL   Total Protein 6.4 (L) 6.5 - 8.1 g/dL   Albumin 3.4 (L) 3.5 - 5.0 g/dL   AST 22 15 - 41 U/L   ALT 19 0 - 44 U/L   Alkaline Phosphatase 77 38 - 126 U/L   Total Bilirubin 0.6 0.3 - 1.2 mg/dL   GFR, Estimated >04 >54 mL/min    Comment: (NOTE) Calculated using the CKD-EPI Creatinine Equation (2021)    Anion gap 12 5 - 15    Comment: Performed at [facility], [address] [place], [place] [address]  Lipase, blood     Status: None   Collection Time: 01/25/23  2:24 PM  Result Value Ref Range   Lipase 21 11 - 51 U/L    Comment: Performed at [facility], [address], [place], [place] [address]  CBC with Differential     Status: Abnormal   Collection Time: 01/25/23  2:24 PM  Result Value Ref Range   WBC 11.5 (H) 4.0 - 10.5 K/uL   RBC 3.91 3.87 - 5.11 MIL/uL   Hemoglobin 11.8 (L) 12.0 - 15.0 g/dL   HCT 29.5 (L) 62.1 - 30.8 %   MCV 90.3 80.0 - 100.0 fL   MCH 30.2 26.0 - 34.0 pg   MCHC 33.4 30.0 - 36.0 g/dL   RDW 65.7 84.6 - 96.2 %   Platelets 335 150 - 400 K/uL   nRBC 0.0 0.0 - 0.2 %   Neutrophils Relative % 65 %   Neutro Abs 7.6 1.7 - 7.7 K/uL   Lymphocytes Relative 23 %   Lymphs Abs 2.6 0.7 - 4.0 K/uL   Monocytes Relative 11 %   Monocytes Absolute 1.2 (H) 0.1 - 1.0 K/uL   Eosinophils Relative 0 %   Eosinophils Absolute 0.0 0.0 - 0.5 K/uL   Basophils Relative 0 %   Basophils Absolute 0.0 0.0 - 0.1 K/uL   Immature Granulocytes 1 %   Abs Immature Granulocytes 0.06 0.00 - 0.07 K/uL    Comment: Performed at [facility], [address] [place], [place] [address]  Ammonia     Status: None   Collection Time: 01/25/23  2:24 PM  Result Value Ref Range   Ammonia 20 9 - 35 umol/L    Comment: Performed at [facility], [address], [place], [place] [address]   CT Head Wo Contrast  Result Date:  01/25/2023 CLINICAL DATA:  Altered mental status. EXAM: CT HEAD WITHOUT CONTRAST TECHNIQUE: Contiguous axial images were obtained from the base of the skull through the vertex without intravenous contrast. RADIATION DOSE REDUCTION: This exam was  performed according to the departmental dose-optimization program which includes automated exposure control, adjustment of the mA and/or kV according to patient size and/or use of iterative reconstruction technique. COMPARISON:  CT head dated March 05, 2022. FINDINGS: Brain: New large acute intraparenchymal hemorrhage in the right temporal lobe measuring approximately 5.3 cm in maximum diameter, with local subarachnoid extension. There is surrounding vasogenic edema and sulcal effacement. There is new asymmetry of the right lateral ventricle compared to the left, with potential early entrapment of the temporal horn. No hydrocephalus or midline shift. Basal cisterns are patent. Stable mild atrophy and moderate chronic microvascular ischemic changes. Expected evolution of the previously seen right frontal intraparenchymal hemorrhage a year ago, now with encephalomalacia. No extra-axial collection or mass lesion. Vascular: Atherosclerotic vascular calcification of the carotid siphons. No hyperdense vessel. Skull: Normal. Negative for fracture or focal lesion. Sinuses/Orbits: No acute finding. Other: None. IMPRESSION: 1. New large acute intraparenchymal hemorrhage in the right temporal lobe measuring approximately 5.3 cm in maximum diameter, with local subarachnoid extension. New asymmetry of the right lateral ventricle compared to the left, with potential early entrapment of the temporal horn. No hydrocephalus or midline shift. 2. Expected evolution of the previously seen right frontal intraparenchymal hemorrhage a year ago, now with encephalomalacia. Critical Value/emergent results were called by telephone at the time of interpretation on 01/25/2023 at 3:59 pm to provider JOSHUA  LONG , who verbally acknowledged these results. Electronically Signed   By: Obie Dredge M.D.   On: 01/25/2023 15:59    Pending Labs Unresulted Labs (From admission, onward)     Start     Ordered   01/26/23 0500  CBC  Daily,   R      01/25/23 1653   01/26/23 0500  Basic metabolic panel  Daily,   R      01/25/23 1653   01/25/23 1509  Urine Culture  Once,   URGENT       Question:  Indication  Answer:  Altered mental status (if no other cause identified)   01/25/23 1510   01/25/23 1509  SARS Coronavirus 2 by RT PCR (hospital order, performed in [facility] hospital lab) *cepheid single result test* Anterior Nasal Swab  (SARS Coronavirus 2 by RT PCR (hospital order, performed in [facility] hospital lab) *cepheid single result test*)  Once,   URGENT        01/25/23 1510   01/25/23 1449  Urinalysis, w/ Reflex to Culture (Infection Suspected) -Urine, Clean Catch  Once,   URGENT       Question:  Specimen Source  Answer:  Urine, Clean Catch   01/25/23 1448            Vitals/Pain Today's Vitals   01/25/23 1500 01/25/23 1515 01/25/23 1645 01/25/23 1700  BP: (!) 167/81 (!) 158/73 (!) 158/77 (!) 140/70  Pulse: (!) 101 (!) 106 98 (!) 109  Resp: 16 17 17 16   SpO2: 95% 95% 94% 93%    Isolation Precautions Airborne and Contact precautions  Medications Medications  clevidipine (CLEVIPREX) infusion 0.5 mg/mL (2 mg/hr Intravenous New Bag/Given 01/25/23 1649)   stroke: early stages of recovery book (has no administration in time range)  acetaminophen (TYLENOL) tablet 650 mg (has no administration in time range)    Or  acetaminophen (TYLENOL) 160 MG/5ML solution 650 mg (has no administration in time range)    Or  acetaminophen (TYLENOL) suppository 650 mg (has no administration in time range)  senna-docusate (Senokot-S) tablet 1 tablet (has no  administration in time range)  pantoprazole (PROTONIX) injection 40 mg (has no administration in time range)  levETIRAcetam (KEPPRA) IVPB 1000  mg/100 mL premix (has no administration in time range)    Mobility non-ambulatory     Focused Assessments Neuro Assessment Handoff:  Swallow screen pass?    NIH Stroke Scale  Dizziness Present: No Headache Present: Yes Interval: Shift assessment Level of Consciousness (1a.)   : Alert, keenly responsive LOC Questions (1b. )   : Answers one question correctly LOC Commands (1c. )   : Performs both tasks correctly Best Gaze (2. )  : Forced deviation Visual (3. )  : Complete hemianopia Facial Palsy (4. )    : Normal symmetrical movements Motor Arm, Left (5a. )   : No effort against gravity Motor Arm, Right (5b. ) : No drift Motor Leg, Left (6a. )  : No effort against gravity Motor Leg, Right (6b. ) : Drift Limb Ataxia (7. ): Absent Sensory (8. )  : Normal, no sensory loss Best Language (9. )  : No aphasia Dysarthria (10. ): Normal Extinction/Inattention (11.)   : No Abnormality Complete NIHSS TOTAL: 12     Neuro Assessment:   Neuro Checks:   Shift assessment (01/25/23 1547)  Has TPA been given?  If patient is a Neuro Trauma and patient is going to OR before floor call report to 4N Charge nurse: [phone] or [phone]   R Recommendations: See Admitting Provider Note  Report given to:   Additional Notes:  patient has a brain bleed

## 2023-01-25 NOTE — ED Triage Notes (Signed)
Pt coming from [facility], out of stroke window. Facility called out for lethargy. Left sided facial drop. Hx of stroke.  No ability to walk and baseline left hand contracted.

## 2023-01-25 NOTE — ED Provider Notes (Signed)
[place] EMERGENCY DEPARTMENT AT [facility] Provider Note   CSN: [id number] Arrival date & time: 01/25/23  1405     History  Hypertension Hyperlipidemia Cerebrovascular accident Epilepsy Vascular dementia   No chief complaint on file.   Cynthia [name] is a 77 y.o. female with a past medical history of hypertension, hyperlipidemia, epilepsy, and stroke who was sent to the [facility] ED from [facility]F for headache and altered mental status.  Patient was oriented to person-place-season and could only answer basic questions about her medical history. She describes feeling unusually tired for the last two days, but cannot remember much else. Reports dysuria, which has persisted for a few weeks. Denies fever, chills, diaphoresis, cough, rhinorrhea, nausea, vomiting, diarrhea, and chest or abdominal pain. Takes all of her medications with help from [facility] staff.  Collateral from [facility] was gathered from clinical care coordinator at [phone] for more accurate and comprehensive history. Per coordinator, the patient developed a severe headache yesterday morning that persisted overnight and into today. Staff also noticed behavioral changes including uncharacteristic aggression toward other residents that started concurrently with the headache. Daily adherence to her prescribed medications including levetiracetam, losartan, and venlafaxine has been uninterrupted.  History notable for intracranial hemorrhage in 03-2016 that has resulted in left-sided hemiparesis. Since arrival to [facility] about one year ago, her functional status and cognition have both gradually declined. She is now wheelchair-bound and frequently disoriented at baseline.   Home Medications Prior to Admission medications   Medication Sig Start Date End Date Taking? Authorizing Provider  acetaminophen (TYLENOL) 500 MG tablet Take 2 tablets (1,000 mg total) by mouth every 6 (six) hours as needed for mild  pain, fever or headache. 03/07/22   Juliet Rude, PA-C  cetirizine (ZYRTEC) 5 MG chewable tablet Chew 5 mg by mouth daily.    [provider]  famotidine (PEPCID) 20 MG tablet Take 1 tablet (20 mg total) by mouth 2 (two) times daily. 03/31/21   Donita Brooks, MD  ferrous sulfate 325 (65 FE) MG tablet Take 325 mg by mouth daily.    [provider]  fluticasone (FLONASE) 50 MCG/ACT nasal spray Place 1 spray into both nostrils daily. 05/23/22   [provider]  ibuprofen (ADVIL) 400 MG tablet Take 1 tablet (400 mg total) by mouth every 6 (six) hours as needed for moderate pain or headache. 03/07/22   Juliet Rude, PA-C  levETIRAcetam (KEPPRA) 750 MG tablet Take 750 mg by mouth 2 (two) times daily. 08/05/22   [provider]  losartan (COZAAR) 100 MG tablet TAKE 1 TABLET BY MOUTH DAILY FOR BLOOD PRESSURE Patient taking differently: Take 100 mg by mouth daily. 07/12/21   Donita Brooks, MD  memantine (NAMENDA) 10 MG tablet Take 1 tablet (10 mg total) by mouth 2 (two) times daily. 05/18/22   Micki Riley, MD  tiZANidine (ZANAFLEX) 2 MG tablet Take 1 tablet (2 mg total) by mouth in the morning. 02/09/22   Angiulli, Mcarthur Rossetti, PA-C  tiZANidine (ZANAFLEX) 4 MG tablet Take 1 tablet (4 mg total) by mouth at bedtime. 02/08/22   Angiulli, Mcarthur Rossetti, PA-C  venlafaxine XR (EFFEXOR-XR) 150 MG 24 hr capsule Take 150 mg by mouth daily with breakfast.    [provider]      Allergies    Other, Acyclovir and related, Codeine, and Penicillins    Review of Systems   Review of Systems  Headache, lethargy, dysuria, behavioral changes   Physical Exam Updated  Vital Signs BP (!) 167/81   Pulse (!) 101   Resp 16   SpO2 95%  Physical Exam  Awake and lethargic but arousable to voice, lying comfortably in bed, not in acute distress Tachycardic rate with regular rhythm, normal S1 and S2, no murmurs Breathing unlabored, symmetrical chest rise Abdomen soft and  non-distended, mild suprapubic tenderness to palpation Neurological exam limited by patient participation secondary to lethargy: Pupils equal and reactive to light bilaterally Sensation to light touch intact and symmetric BUE BLE and CN V1-2 but asymmetric in V3 Strength RUE 3/5 arm and 4-/5 grip, LUE 2/5 arm and 3-/5 grip, RLE 2/5 hip, LLE 2-/5 hip   Oriented to person-place-season, speech delayed and slow with poverty of content    ED Results / Procedures / Treatments   Labs (all labs ordered are listed, but only abnormal results are displayed) Labs Reviewed  CBC WITH DIFFERENTIAL/PLATELET - Abnormal; Notable for the following components:      Result Value   WBC 11.5 (*)    Hemoglobin 11.8 (*)    HCT 35.3 (*)    Monocytes Absolute 1.2 (*)    All other components within normal limits  URINE CULTURE  SARS CORONAVIRUS 2 BY RT PCR  URINALYSIS, W/ REFLEX TO CULTURE (INFECTION SUSPECTED)  COMPREHENSIVE METABOLIC PANEL  LIPASE, BLOOD  AMMONIA    EKG None  Radiology CT Head Wo Contrast  Result Date: 01/25/2023 CLINICAL DATA:  Altered mental status. EXAM: CT HEAD WITHOUT CONTRAST TECHNIQUE: Contiguous axial images were obtained from the base of the skull through the vertex without intravenous contrast. RADIATION DOSE REDUCTION: This exam was performed according to the departmental dose-optimization program which includes automated exposure control, adjustment of the mA and/or kV according to patient size and/or use of iterative reconstruction technique. COMPARISON:  CT head dated March 05, 2022. FINDINGS: Brain: New large acute intraparenchymal hemorrhage in the right temporal lobe measuring approximately 5.3 cm in maximum diameter, with local subarachnoid extension. There is surrounding vasogenic edema and sulcal effacement. There is new asymmetry of the right lateral ventricle compared to the left, with potential early entrapment of the temporal horn. No hydrocephalus or midline shift.  Basal cisterns are patent. Stable mild atrophy and moderate chronic microvascular ischemic changes. Expected evolution of the previously seen right frontal intraparenchymal hemorrhage a year ago, now with encephalomalacia. No extra-axial collection or mass lesion. Vascular: Atherosclerotic vascular calcification of the carotid siphons. No hyperdense vessel. Skull: Normal. Negative for fracture or focal lesion. Sinuses/Orbits: No acute finding. Other: None. IMPRESSION: 1. New large acute intraparenchymal hemorrhage in the right temporal lobe measuring approximately 5.3 cm in maximum diameter, with local subarachnoid extension. New asymmetry of the right lateral ventricle compared to the left, with potential early entrapment of the temporal horn. No hydrocephalus or midline shift. 2. Expected evolution of the previously seen right frontal intraparenchymal hemorrhage a year ago, now with encephalomalacia. Critical Value/emergent results were called by telephone at the time of interpretation on 01/25/2023 at 3:59 pm to provider JOSHUA LONG , who verbally acknowledged these results. Electronically Signed   By: Obie Dredge M.D.   On: 01/25/2023 15:59    Procedures Procedures   None   Medications Ordered in ED Medications  clevidipine (CLEVIPREX) infusion 0.5 mg/mL (has no administration in time range)    ED Course/ Medical Decision Making/ A&P Clinical Course as of 01/25/23 1615  Wed Jan 25, 2023  1547 CT Head Wo Contrast [JL]    Clinical Course User Index [  JL] Long, Arlyss Repress, MD   {   Click here for ABCD2, HEART and other calculator                         Medical Decision Making Amount and/or Complexity of Data Reviewed Radiology: ordered.   Patient was sent to [facility] ED from [facility] ALF for headache, lethargy, and behavioral changes that started yesterday 5-14 morning. Upon arrival, patient was lethargic and oriented to person-place-season. Blood pressure stable at 160s/80s. Neurological  exam limited by patient participation but notable for generalized weakness that is greater on left side, consistent with reported baseline. Head CT revealed 5.3cm intraparenchymal hemorrhage in right temporal lobe without midline shift, possibly secondary to hemorrhagic conversion or cerebral amyloid angiopathy. Clevidipine administered for tight blood pressure control. Neurology has been consulted and will admit.    Final Clinical Impression(s) / ED Diagnoses Final diagnoses:  Other right-sided nontraumatic intracerebral hemorrhage [facility])    Rx / DC Orders ED Discharge Orders     None         Crissie Sickles, MD 01/25/23 1621

## 2023-01-25 NOTE — H&P (Addendum)
Stroke Neurology HPI Note  The history was obtained from the pt and chart.  Pt not good historian, most history from chart at this time.  History of Present Illness:  Cynthia [name] is a 77 y.o. 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  Per patient, she went to sleep 5/13 at night at her baseline.  She woke up 5/14 with headache at the top of her head and headache continued until now, and she thought to her staff at [facility] facility and she was sent to ER for evaluation.  Per chart, EDP contacted [facility] staff and reported that patient had headache 5/14 in the morning and persist until now.  They also noticed patient had behavior changes including aggression towards other resident that started same time with headache.  Denies any significant change from residual left-sided weakness from previous ICHs.  In ER, CT showed right temporal ICH.  BP 160s, put on Cleviprex for BP control. I called [facility] facility but not able to get hold of anybody. Code status not quite clear at this time.   Patient had right frontal ICH 2017 and partial seizure but later taking off Keppra.  She had a recurrent right frontal ICH in 01/2022, MRI concerning for CAA.  MRI no aneurysm or AVM, carotid Doppler unremarkable.  EF 60 to 65%, LDL 94, A1c 5.7.  Patient had a 4 procedure generalized to GTC on 01/11/2022, supposed Ativan and loaded with Keppra.  LTM EEG showed subclinical seizure coming from right frontotemporal region.  Put on Keppra 1 g twice daily.  She follows with Dr. Pearlean Brownie at [facility], continue the Keppra.  Patient also had memory decline for the last 2 years.  Home medication including Keppra, Cozaar, Namenda and Effexor. Per chart, since last ICH, her functional status and cognition have both gradually declined. She is now wheelchair-bound and frequently  disoriented at baseline.    LSN: two day ago at night before went to bed tPA Given: No: ICH ICH score = 1  Past Medical History:  Diagnosis Date   Breast cancer (HCC)    Colon polyps    Depression    history of   Diverticulitis    GERD (gastroesophageal reflux disease)    Hypertension    Osteoporosis    Seizures (HCC)    Stroke [facility])     Past Surgical History:  Procedure Laterality Date   BREAST LUMPECTOMY WITH RADIOACTIVE SEED LOCALIZATION Left 08/28/2020   Procedure: LEFT BREAST LUMPECTOMY WITH RADIOACTIVE SEED LOCALIZATION;  Surgeon: Almond Lint, MD;  Location: [facility] OR;  Service: General;  Laterality: Left;  RNFA   IR GENERIC HISTORICAL  08/05/2016   IR ANGIO VERTEBRAL SEL VERTEBRAL UNI R MOD SED 08/05/2016 Julieanne Cotton, MD [facility]-INTERV RAD   IR GENERIC HISTORICAL  08/05/2016   IR ANGIO INTRA EXTRACRAN SEL COM CAROTID INNOMINATE BILAT MOD SED 08/05/2016 Julieanne Cotton, MD [facility]-INTER[facility]   IR GENERIC HISTORICAL  08/05/2016   IR ANGIO VERTEBRAL SEL SUBCLAVIAN INNOMINATE UNI L MOD SED 08/05/2016 Julieanne Cotton, MD [facility]-INTER[facility] RAD   MOUTH SURGERY     TONSILLECTOMY     TUBAL LIGATION      Family History  Problem Relation Age of Onset   Heart disease Mother    Cancer Mother        lung   Stroke Paternal Grandmother    Stomach cancer Maternal Grandfather  Social History:  reports that she has never smoked. She has never been exposed to tobacco smoke. She has never used smokeless tobacco. She reports current alcohol use of about 1.0 standard drink of alcohol per week. She reports that she does not currently use drugs.  Allergies:  Allergies  Allergen Reactions   Other Anaphylaxis    FIRE ANTS Insect stings / bites   Acyclovir And Related Other (See Comments)    Unknown reaction   Codeine Other (See Comments)    Syncope    Penicillins Rash    Has patient had a PCN reaction causing immediate rash, facial/tongue/throat swelling, SOB or lightheadedness with  hypotension: Unknown Has patient had a PCN reaction causing severe rash involving mucus membranes or skin necrosis: No Has patient had a PCN reaction that required hospitalization No Has patient had a PCN reaction occurring within the last 10 years: No If all of the above answers are "NO", then may proceed with Cephalosporin use.     No current facility-administered medications on file prior to encounter.   Current Outpatient Medications on File Prior to Encounter  Medication Sig Dispense Refill   acetaminophen (TYLENOL) 500 MG tablet Take 2 tablets (1,000 mg total) by mouth every 6 (six) hours as needed for mild pain, fever or headache.     cetirizine (ZYRTEC) 5 MG chewable tablet Chew 5 mg by mouth daily.     famotidine (PEPCID) 20 MG tablet Take 1 tablet (20 mg total) by mouth 2 (two) times daily. 180 tablet 2   ferrous sulfate 325 (65 FE) MG tablet Take 325 mg by mouth daily.     fluticasone (FLONASE) 50 MCG/ACT nasal spray Place 1 spray into both nostrils daily.     ibuprofen (ADVIL) 400 MG tablet Take 1 tablet (400 mg total) by mouth every 6 (six) hours as needed for moderate pain or headache.     levETIRAcetam (KEPPRA) 750 MG tablet Take 750 mg by mouth 2 (two) times daily.     losartan (COZAAR) 100 MG tablet TAKE 1 TABLET BY MOUTH DAILY FOR BLOOD PRESSURE (Patient taking differently: Take 100 mg by mouth daily.) 90 tablet 2   memantine (NAMENDA) 10 MG tablet Take 1 tablet (10 mg total) by mouth 2 (two) times daily. 60 tablet 3   tiZANidine (ZANAFLEX) 2 MG tablet Take 1 tablet (2 mg total) by mouth in the morning. 30 tablet 0   tiZANidine (ZANAFLEX) 4 MG tablet Take 1 tablet (4 mg total) by mouth at bedtime. 30 tablet 0   venlafaxine XR (EFFEXOR-XR) 150 MG 24 hr capsule Take 150 mg by mouth daily with breakfast.      Review of Systems: A full ROS was attempted today and was not able to be performed due to AMS.  Physical Examination: Pulse Rate:  [98-106] 98 (05/15 1645) Resp:   [16-19] 17 (05/15 1645) BP: (158-167)/(73-83) 158/77 (05/15 1645) SpO2:  [94 %-96 %] 94 % (05/15 1645)  General - well nourished, well developed, in mild distress of HA.    Ophthalmologic - fundi not visualized due to noncooperation.    Cardiovascular - regular rhythm and rate  Neuro - sleepy and lethargic, eyes closed but open on voice and tactile stimulation. She is orientated to age, place but not to time or situation. No aphasia, limited output, slight dysarthria, following most simple commands. Able to name 1/3 and repeat simple sentence. Right gaze palsy, barely cross midline, tracking on the right visual field, left hemianopia vs. Visual  neglect, PERRL. Left facial droop. Tongue midline. RUE at least 4/5, LUE proximal 2/5 and distal hand in fist position, difficulty with extension but hand grip 2-3/5. RLE 3-/5 and LLE withdraw to pain. Sensation symmetrical bilaterally subjectively but seems decreased on the left, right FTN intact but slow, gait not tested.    Data Reviewed: CT Head Wo Contrast  Result Date: 01/25/2023 CLINICAL DATA:  Altered mental status. EXAM: CT HEAD WITHOUT CONTRAST TECHNIQUE: Contiguous axial images were obtained from the base of the skull through the vertex without intravenous contrast. RADIATION DOSE REDUCTION: This exam was performed according to the departmental dose-optimization program which includes automated exposure control, adjustment of the mA and/or kV according to patient size and/or use of iterative reconstruction technique. COMPARISON:  CT head dated March 05, 2022. FINDINGS: Brain: New large acute intraparenchymal hemorrhage in the right temporal lobe measuring approximately 5.3 cm in maximum diameter, with local subarachnoid extension. There is surrounding vasogenic edema and sulcal effacement. There is new asymmetry of the right lateral ventricle compared to the left, with potential early entrapment of the temporal horn. No hydrocephalus or midline  shift. Basal cisterns are patent. Stable mild atrophy and moderate chronic microvascular ischemic changes. Expected evolution of the previously seen right frontal intraparenchymal hemorrhage a year ago, now with encephalomalacia. No extra-axial collection or mass lesion. Vascular: Atherosclerotic vascular calcification of the carotid siphons. No hyperdense vessel. Skull: Normal. Negative for fracture or focal lesion. Sinuses/Orbits: No acute finding. Other: None. IMPRESSION: 1. New large acute intraparenchymal hemorrhage in the right temporal lobe measuring approximately 5.3 cm in maximum diameter, with local subarachnoid extension. New asymmetry of the right lateral ventricle compared to the left, with potential early entrapment of the temporal horn. No hydrocephalus or midline shift. 2. Expected evolution of the previously seen right frontal intraparenchymal hemorrhage a year ago, now with encephalomalacia. Critical Value/emergent results were called by telephone at the time of interpretation on 01/25/2023 at 3:59 pm to provider JOSHUA LONG , who verbally acknowledged these results. Electronically Signed   By: Obie Dredge M.D.   On: 01/25/2023 15:59    Assessment: 77 y.o. 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 CT showed right temporal ICH. Etiology for recurrent ICH likely due to CAA. BP 160s, put on Cleviprex for BP control. Given CAA, will further BP control at 120-140. Continue IV keppra. Will repeat CT in 6h to document stability.    Plan: ICU admission for close vital sign monitoring and neuro neck Stat CT head if acute neuro changes Blood pressure management - Target SBP 1 20-1 40 given CAA, on cleviprex drip  Head of bed elevation > 30 degrees if not contraindicated Continue Keppra 1 g IV for seizure prevention Seizure precautions Repeat  CT head without contrast midnight to document stability of hemorrhage  Discussed with Dr. Jacqulyn Bath ED physician  Marvel Plan, MD PhD Stroke Neurology 01/25/2023 5:22 PM  This patient is critically ill due to recurrent ICH, CAA, cerebral edema and at significant risk of neurological worsening, death form hematoma expansion, brain herniation, recurrent ICH. This patient's care requires constant monitoring of vital signs, hemodynamics, respiratory and cardiac monitoring, review of multiple databases, neurological assessment, discussion with family, other specialists and medical decision making of high complexity. I spent 40 minutes of neurocritical care time in the care of this patient.

## 2023-01-26 ENCOUNTER — Inpatient Hospital Stay (HOSPITAL_COMMUNITY): Payer: Medicare PPO

## 2023-01-26 DIAGNOSIS — I611 Nontraumatic intracerebral hemorrhage in hemisphere, cortical: Secondary | ICD-10-CM | POA: Diagnosis not present

## 2023-01-26 DIAGNOSIS — I6389 Other cerebral infarction: Secondary | ICD-10-CM | POA: Diagnosis not present

## 2023-01-26 DIAGNOSIS — I680 Cerebral amyloid angiopathy: Secondary | ICD-10-CM | POA: Diagnosis not present

## 2023-01-26 LAB — BASIC METABOLIC PANEL
Anion gap: 12 (ref 5–15)
BUN: 10 mg/dL (ref 8–23)
CO2: 27 mmol/L (ref 22–32)
Calcium: 9.4 mg/dL (ref 8.9–10.3)
Chloride: 96 mmol/L — ABNORMAL LOW (ref 98–111)
Creatinine, Ser: 0.75 mg/dL (ref 0.44–1.00)
GFR, Estimated: >60 mL/min (ref >60)
Glucose, Bld: 131 mg/dL — ABNORMAL HIGH (ref 70–99)
Potassium: 3.8 mmol/L (ref 3.5–5.1)
Sodium: 135 mmol/L (ref 135–145)

## 2023-01-26 LAB — ECHOCARDIOGRAM COMPLETE
AR max vel: 1.42 cm2
AV Area VTI: 1.57 cm2
AV Area mean vel: 1.42 cm2
AV Mean grad: 3 mmHg
AV Peak grad: 6.2 mmHg
Ao pk vel: 1.24 m/s
Area-P 1/2: 4.88 cm2
MV VTI: 2.16 cm2
S' Lateral: 2 cm

## 2023-01-26 LAB — CBC
HCT: 39.7 % (ref 36.0–46.0)
Hemoglobin: 13.1 g/dL (ref 12.0–15.0)
MCH: 30.3 pg (ref 26.0–34.0)
MCHC: 33 g/dL (ref 30.0–36.0)
MCV: 91.7 fL (ref 80.0–100.0)
Platelets: 334 10*3/uL (ref 150–400)
RBC: 4.33 MIL/uL (ref 3.87–5.11)
RDW: 12.1 % (ref 11.5–15.5)
WBC: 9.7 10*3/uL (ref 4.0–10.5)
nRBC: 0 % (ref 0.0–0.2)

## 2023-01-26 LAB — HEMOGLOBIN A1C
Hgb A1c MFr Bld: 5.3 % (ref 4.8–5.6)
Mean Plasma Glucose: 105.41 mg/dL

## 2023-01-26 LAB — LIPID PANEL
Cholesterol: 232 mg/dL — ABNORMAL HIGH (ref 0–200)
HDL: 90 mg/dL (ref >40)
LDL Cholesterol: 128 mg/dL — ABNORMAL HIGH (ref 0–99)
Total CHOL/HDL Ratio: 2.6 RATIO
Triglycerides: 72 mg/dL (ref <150)
VLDL: 14 mg/dL (ref 0–40)

## 2023-01-26 MED ORDER — FAMOTIDINE 20 MG PO TABS
20.0000 mg | ORAL_TABLET | Freq: Two times a day (BID) | ORAL | Status: DC
  Administered 2023-01-26 – 2023-01-31 (×10): 20 mg via ORAL
  Filled 2023-01-26 (×10): qty 1

## 2023-01-26 MED ORDER — HEPARIN SODIUM (PORCINE) 5000 UNIT/ML IJ SOLN
5000.0000 [IU] | Freq: Three times a day (TID) | INTRAMUSCULAR | Status: DC
  Administered 2023-01-26 – 2023-01-31 (×14): 5000 [IU] via SUBCUTANEOUS
  Filled 2023-01-26 (×14): qty 1

## 2023-01-26 MED ORDER — SODIUM CHLORIDE 0.9 % IV SOLN: INTRAVENOUS | Status: DC

## 2023-01-26 MED ORDER — PERFLUTREN LIPID MICROSPHERE
1.0000 mL | INTRAVENOUS | Status: AC | PRN
  Administered 2023-01-26: 3 mL via INTRAVENOUS

## 2023-01-26 MED ORDER — LOSARTAN POTASSIUM 50 MG PO TABS
100.0000 mg | ORAL_TABLET | Freq: Every day | ORAL | Status: DC
  Administered 2023-01-26 – 2023-01-31 (×6): 100 mg via ORAL
  Filled 2023-01-26 (×6): qty 2

## 2023-01-26 MED ORDER — METOPROLOL TARTRATE 50 MG PO TABS
50.0000 mg | ORAL_TABLET | Freq: Two times a day (BID) | ORAL | Status: DC
  Administered 2023-01-26 – 2023-01-31 (×10): 50 mg via ORAL
  Filled 2023-01-26 (×10): qty 1

## 2023-01-26 MED ORDER — LEVETIRACETAM 750 MG PO TABS
750.0000 mg | ORAL_TABLET | Freq: Two times a day (BID) | ORAL | Status: DC
  Administered 2023-01-26 – 2023-01-31 (×10): 750 mg via ORAL
  Filled 2023-01-26 (×10): qty 1

## 2023-01-26 NOTE — Evaluation (Signed)
Speech Language Pathology Evaluation Patient Details Name: Cynthia [name] MRN: [id number] DOB: Jun 01, 1946 Today's Date: 01/26/2023 Time: 4098-1191 SLP Time Calculation (min) (ACUTE ONLY): 15 min  Problem List:  Patient Active Problem List   Diagnosis Date Noted   Fall 03/04/2022   Poor appetite    Mood disorder in conditions classified elsewhere    Seizure (HCC) 01/14/2022   Hyperlipidemia 01/14/2022   Hypokalemia 01/14/2022   Ductal carcinoma in situ (DCIS) of left breast 07/23/2020   Partial symptomatic epilepsy with complex partial seizures, not intractable, without status epilepticus (HCC) 06/08/2017   Numbness and tingling 10/03/2016   Cognitive deficit due to recent stroke    Left hemiparesis (HCC) 03/29/2016   Left-sided visual neglect 03/29/2016   Vascular headache    Benign essential HTN    Hyponatremia    Acute blood loss anemia    TBI (traumatic brain injury) (HCC)    Intracerebral hemorrhage 03/20/2016   ICH (intracerebral hemorrhage) (HCC) 03/20/2016   Colon polyps    Osteoporosis    HTN (hypertension) 01/08/2013   Past Medical History:  Past Medical History:  Diagnosis Date   Breast cancer (HCC)    Colon polyps    Depression    history of   Diverticulitis    GERD (gastroesophageal reflux disease)    Hypertension    Osteoporosis    Seizures (HCC)    Stroke [facility])    Past Surgical History:  Past Surgical History:  Procedure Laterality Date   BREAST LUMPECTOMY WITH RADIOACTIVE SEED LOCALIZATION Left 08/28/2020   Procedure: LEFT BREAST LUMPECTOMY WITH RADIOACTIVE SEED LOCALIZATION;  Surgeon: Cynthia Lint, MD;  Location: [facility] OR;  Service: General;  Laterality: Left;  RNFA   IR GENERIC HISTORICAL  08/05/2016   IR ANGIO VERTEBRAL SEL VERTEBRAL UNI R MOD SED 08/05/2016 Cynthia Cotton, MD [facility]-INTER[facility] RAD   IR GENERIC HISTORICAL  08/05/2016   IR ANGIO INTRA EXTRACRAN SEL COM CAROTID INNOMINATE BILAT MOD SED 08/05/2016 Cynthia Cotton, MD [facility]INTER[facility]    IR GENERIC HISTORICAL  08/05/2016   IR ANGIO VERTEBRAL SEL SUBCLAVIAN INNOMINATE UNI L MOD SED 08/05/2016 Cynthia Cotton, MD [facility]INTER[facility]   MOUTH SURGERY     TONSILLECTOMY     TUBAL LIGATION     HPI:  Pt is a 77 yr old female presented from SNF on 5/15 with HA and AMS. Dx R temporal ICH. 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  Per chart, since last ICH, her functional status and cognition have both gradually declined. She is now wheelchair-bound and frequently disoriented at baseline. Hx includes seizures, dementia, breast ca, colon polyps, depression, diverticulitis, GERD, HTN.   Assessment / Plan / Recommendation Clinical Impression  Cynthia [name] was lethargic; kept eyes closed for session but answered biographical questions with some detail (regarding children and grandchildren) and followed simple commands. Speech was clear/without dysarthria and fluent.  Demonstrated delayed response time (motor and verbal).  Oriented to self only.  Her sisters-in-law reported not oriented to time at baseline, able to engage in social conversation but with memory deficits. SLP will follow for cognitive-communication while admitted and to determine f/u needs as she begins to become more alert.    SLP Assessment  SLP Recommendation/Assessment: Patient needs continued Speech Lanaguage Pathology Services SLP Visit Diagnosis: Cognitive communication deficit (R41.841)    Recommendations for follow up therapy are one component of a multi-disciplinary discharge planning process, led by the attending physician.  Recommendations may be updated based  on patient status, additional functional criteria and insurance authorization.    Follow Up Recommendations  Other (comment) (tba)    Assistance Recommended at Discharge  Frequent or constant Supervision/Assistance  Functional Status Assessment    Frequency and Duration min 2x/week  2 weeks      SLP Evaluation Cognition   Overall Cognitive Status: Impaired/Different from baseline Arousal/Alertness: Lethargic Orientation Level: Oriented to person;Disoriented to place;Disoriented to time;Disoriented to situation Attention: Sustained Sustained Attention: Impaired Sustained Attention Impairment: Verbal basic;Functional basic Memory: Impaired Memory Impairment: Storage deficit Awareness: Impaired Awareness Impairment: Intellectual impairment Safety/Judgment: Impaired       Comprehension  Auditory Comprehension Overall Auditory Comprehension: Appears within functional limits for tasks assessed Reading Comprehension Reading Status: Not tested    Expression Expression Primary Mode of Expression: Verbal Verbal Expression Overall Verbal Expression: Appears within functional limits for tasks assessed Written Expression Dominant Hand: Right   Oral / Motor  Oral Motor/Sensory Function Overall Oral Motor/Sensory Function: Within functional limits Motor Speech Overall Motor Speech: Appears within functional limits for tasks assessed            Cynthia [name] 01/26/2023, 11:25 AM Cynthia Folks L. [name], MA CCC/SLP Clinical Specialist - Acute Care SLP Acute Rehabilitation Services Office number [phone]

## 2023-01-26 NOTE — ACP (Advance Care Planning) (Signed)
Spoke with son Cynthia [name] who is also patient's POA at length over the phone.    Mr. Cynthia [name] has confirmed that patient should remain a full code and that she does not have any paperwork stating otherwise with him or at [facility] facility.

## 2023-01-26 NOTE — Progress Notes (Signed)
PT Cancellation Note  Patient Details Name: Cynthia [name] MRN: [id number] DOB: August 15, 1946   Cancelled Treatment:    Reason Eval/Treat Not Completed: (P) Active bedrest order x24 hours starting 01/25/23 between 4:00-5:00 PM. Unless indicated otherwise, will plan to hold off on PT Eval until after the 24 hour bedrest, which will likely be tomorrow for PT.   Raymond Gurney, PT, DPT Acute Rehabilitation Services  Office: [phone]    Jewel Baize 01/26/2023, 9:20 AM

## 2023-01-26 NOTE — TOC Initial Note (Signed)
Transition of Care [facility]) - Initial/Assessment Note    Patient Details  Name: Cynthia [name] MRN: [id number] Date of Birth: 1945/10/09  Transition of Care [facility]) CM/SW Contact:    Mearl Latin, LCSW Phone Number: 01/26/2023, 3:28 PM  Clinical Narrative:                 Patient admitted from [facility] SN[facility] long term care. CSW will continue to follow.   Expected Discharge Plan: Skilled Nursing Facility Barriers to Discharge: Continued Medical Work up   Patient Goals and CMS Choice            Expected Discharge Plan and Services In-house Referral: Clinical Social Work   Post Acute Care Choice: Skilled Nursing Facility Living arrangements for the past 2 months: Skilled Nursing Facility                                      Prior Living Arrangements/Services Living arrangements for the past 2 months: Skilled Nursing Facility Lives with:: Facility Resident Patient language and need for interpreter reviewed:: Yes        Need for Family Participation in Patient Care: Yes (Comment) Care giver support system in place?: Yes (comment)   Criminal Activity/Legal Involvement Pertinent to Current Situation/Hospitalization: No - Comment as needed  Activities of Daily Living      Permission Sought/Granted                  Emotional Assessment       Orientation: : Oriented to Self Alcohol / Substance Use: Not Applicable Psych Involvement: No (comment)  Admission diagnosis:  ICH (intracerebral hemorrhage) (HCC) [I61.9] Other right-sided nontraumatic intracerebral hemorrhage (HCC) [I61.8] Patient Active Problem List   Diagnosis Date Noted   Fall 03/04/2022   Poor appetite    Mood disorder in conditions classified elsewhere    Seizure (HCC) 01/14/2022   Hyperlipidemia 01/14/2022   Hypokalemia 01/14/2022   Ductal carcinoma in situ (DCIS) of left breast 07/23/2020   Partial symptomatic epilepsy with complex partial seizures, not intractable, without status  epilepticus (HCC) 06/08/2017   Numbness and tingling 10/03/2016   Cognitive deficit due to recent stroke    Left hemiparesis (HCC) 03/29/2016   Left-sided visual neglect 03/29/2016   Vascular headache    Benign essential HTN    Hyponatremia    Acute blood loss anemia    TBI (traumatic brain injury) (HCC)    Intracerebral hemorrhage 03/20/2016   ICH (intracerebral hemorrhage) (HCC) 03/20/2016   Colon polyps    Osteoporosis    HTN (hypertension) 01/08/2013   PCP:  Eloisa Northern, MD Pharmacy:   [facility] [phone] - [place], [place] - [address] [place] [address] Phone: [phone] Fax: [phone]  [facility] [address] [place] - [address] AT [address] OF [address] [place] [address] Phone: [phone] Fax: [phone]     Social Determinants of Health (SDOH) Social History: SDOH Screenings   Food Insecurity: No Food Insecurity (03/05/2021)  Housing: Low Risk  (03/05/2021)  Transportation Needs: No Transportation Needs (03/05/2021)  Alcohol Screen: Low Risk  (03/05/2021)  Depression (PHQ2-9): Low Risk  (03/05/2021)  Financial Resource Strain: Low Risk  (03/05/2021)  Physical Activity: Sufficiently Active (03/05/2021)  Social Connections: Moderately Isolated (03/05/2021)  Stress: No Stress Concern Present (03/05/2021)  Tobacco Use: Low Risk  (  09/08/2022)   SDOH Interventions:     Readmission Risk Interventions     No data to display
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
## 2023-01-26 NOTE — Progress Notes (Signed)
OT Cancellation Note  Patient Details Name: Cynthia [name] MRN: [id number] DOB: 06-10-1946   Cancelled Treatment:    Reason Eval/Treat Not Completed: Active bedrest order;Patient not medically ready (Will follow up later time)  [facility] 01/26/2023, 2:28 PM Luisa Dago, OT/L   Acute OT Clinical Specialist Acute Rehabilitation Services Pager [phone] Office [phone]

## 2023-01-26 NOTE — Evaluation (Signed)
Clinical/Bedside Swallow Evaluation Patient Details  Name: Cynthia [name] MRN: [id number] Date of Birth: 02/09/1946  Today's Date: 01/26/2023 Time: SLP Start Time (ACUTE ONLY): 1040 SLP Stop Time (ACUTE ONLY): 1051 SLP Time Calculation (min) (ACUTE ONLY): 11 min  Past Medical History:  Past Medical History:  Diagnosis Date   Breast cancer (HCC)    Colon polyps    Depression    history of   Diverticulitis    GERD (gastroesophageal reflux disease)    Hypertension    Osteoporosis    Seizures (HCC)    Stroke [facility])    Past Surgical History:  Past Surgical History:  Procedure Laterality Date   BREAST LUMPECTOMY WITH RADIOACTIVE SEED LOCALIZATION Left 08/28/2020   Procedure: LEFT BREAST LUMPECTOMY WITH RADIOACTIVE SEED LOCALIZATION;  Surgeon: Cynthia Lint, MD;  Location: [facility] OR;  Service: General;  Laterality: Left;  RNFA   IR GENERIC HISTORICAL  08/05/2016   IR ANGIO VERTEBRAL SEL VERTEBRAL UNI R MOD SED 08/05/2016 Cynthia Cotton, MD [facility]-INTERV RAD   IR GENERIC HISTORICAL  08/05/2016   IR ANGIO INTRA EXTRACRAN SEL COM CAROTID INNOMINATE BILAT MOD SED 08/05/2016 Cynthia Cotton, MD [facility]-INTER[facility] RAD   IR GENERIC HISTORICAL  08/05/2016   IR ANGIO VERTEBRAL SEL SUBCLAVIAN INNOMINATE UNI L MOD SED 08/05/2016 Cynthia Cotton, MD [facility]INTER[facility]   MOUTH SURGERY     TONSILLECTOMY     TUBAL LIGATION     HPI:    Pt is a 77 yr old female presented from SNF on 5/15 with HA and AMS. Dx R temporal ICH. 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  Per chart, since last ICH, her functional status and cognition have both gradually declined. She is now wheelchair-bound and frequently disoriented at baseline. Hx includes seizures, dementia, breast ca, colon polyps, depression, diverticulitis, GERD, HTN.    Assessment / Plan / Recommendation  Clinical Impression  Pt presents with functional swallowing - barriers are related to lethargy but there are no s/s of a  neurogenic dysphagia. She kept her eyes closed for duration of session. No focal CN deficits.  Accepted ice chips, water, and applesauce with cues needed to anticipate approaching bolus, but adequate oral manipulation and palpable swallow were observed; there were no s/s of aspiration. Recommend starting with a full liquid diet until she becomes more alert. Her sisters-in-law were at the bedside and were agreeable to plan. SLP Visit Diagnosis: Dysphagia, unspecified (R13.10)    Aspiration Risk    unknown   Diet Recommendation   Full liquids  Medication Administration: Whole meds with puree    Other  Recommendations Oral Care Recommendations: Oral care BID    Recommendations for follow up therapy are one component of a multi-disciplinary discharge planning process, led by the attending physician.  Recommendations may be updated based on patient status, additional functional criteria and insurance authorization.  Follow up Recommendations No SLP follow up              Frequency and Duration min 2x/week  1 week       Prognosis Prognosis for improved oropharyngeal function: Good      Swallow Study   General Date of Onset: 01/26/23 Type of Study: Bedside Swallow Evaluation Previous Swallow Assessment: see HPI Diet Prior to this Study: NPO Temperature Spikes Noted: No Respiratory Status: Room air History of Recent Intubation: No Behavior/Cognition: Lethargic/Drowsy Oral Cavity Assessment: Within Functional Limits Oral Care Completed by SLP: Recent completion by staff Oral Cavity -  Dentition: Adequate natural dentition Self-Feeding Abilities: Total assist Patient Positioning: Upright in bed Baseline Vocal Quality: Normal Volitional Cough: Strong Volitional Swallow: Able to elicit    Oral/Motor/Sensory Function Overall Oral Motor/Sensory Function: Within functional limits   Ice Chips Ice chips: Within functional limits Presentation: Spoon   Thin Liquid Thin Liquid: Within  functional limits    Nectar Thick Nectar Thick Liquid: Not tested   Honey Thick Honey Thick Liquid: Not tested   Puree Puree: Within functional limits   Solid     Solid: Not tested      Blenda [name] Cynthia 01/26/2023,11:12 AM  Cynthia Folks L. [name], MA CCC/SLP Clinical Specialist - Acute Care SLP Acute Rehabilitation Services Office number [phone]

## 2023-01-27 DIAGNOSIS — I611 Nontraumatic intracerebral hemorrhage in hemisphere, cortical: Secondary | ICD-10-CM | POA: Diagnosis not present

## 2023-01-27 DIAGNOSIS — I680 Cerebral amyloid angiopathy: Secondary | ICD-10-CM | POA: Diagnosis not present

## 2023-01-27 LAB — CBC
HCT: 32.9 % — ABNORMAL LOW (ref 36.0–46.0)
Hemoglobin: 11 g/dL — ABNORMAL LOW (ref 12.0–15.0)
MCH: 30.5 pg (ref 26.0–34.0)
MCHC: 33.4 g/dL (ref 30.0–36.0)
MCV: 91.1 fL (ref 80.0–100.0)
Platelets: 285 10*3/uL (ref 150–400)
RBC: 3.61 MIL/uL — ABNORMAL LOW (ref 3.87–5.11)
RDW: 12.2 % (ref 11.5–15.5)
WBC: 8.6 10*3/uL (ref 4.0–10.5)
nRBC: 0 % (ref 0.0–0.2)

## 2023-01-27 LAB — BASIC METABOLIC PANEL
Anion gap: 18 — ABNORMAL HIGH (ref 5–15)
BUN: 17 mg/dL (ref 8–23)
CO2: 21 mmol/L — ABNORMAL LOW (ref 22–32)
Calcium: 7.8 mg/dL — ABNORMAL LOW (ref 8.9–10.3)
Chloride: 100 mmol/L (ref 98–111)
Creatinine, Ser: 0.64 mg/dL (ref 0.44–1.00)
GFR, Estimated: >60 mL/min (ref >60)
Glucose, Bld: 100 mg/dL — ABNORMAL HIGH (ref 70–99)
Potassium: 3.7 mmol/L (ref 3.5–5.1)
Sodium: 139 mmol/L (ref 135–145)

## 2023-01-27 LAB — URINE CULTURE

## 2023-01-27 MED ORDER — AMLODIPINE BESYLATE 5 MG PO TABS
5.0000 mg | ORAL_TABLET | Freq: Once | ORAL | Status: AC
  Administered 2023-01-27: 5 mg via ORAL
  Filled 2023-01-27: qty 1

## 2023-01-27 MED ORDER — SODIUM CHLORIDE 0.9 % IV SOLN
1.0000 g | INTRAVENOUS | Status: AC
  Administered 2023-01-27 – 2023-01-31 (×5): 1 g via INTRAVENOUS
  Filled 2023-01-27 (×5): qty 10

## 2023-01-27 MED ORDER — AMLODIPINE BESYLATE 10 MG PO TABS
10.0000 mg | ORAL_TABLET | Freq: Every day | ORAL | Status: DC
  Administered 2023-01-28 – 2023-01-31 (×4): 10 mg via ORAL
  Filled 2023-01-27 (×4): qty 1

## 2023-01-27 MED ORDER — LABETALOL HCL 5 MG/ML IV SOLN: 10.0000 mg | INTRAVENOUS | Status: DC | PRN

## 2023-01-27 MED ORDER — HYDRALAZINE HCL 20 MG/ML IJ SOLN
10.0000 mg | INTRAMUSCULAR | Status: DC | PRN
  Administered 2023-01-27 – 2023-01-28 (×2): 10 mg via INTRAVENOUS
  Filled 2023-01-27 (×2): qty 1

## 2023-01-27 MED ORDER — AMLODIPINE BESYLATE 5 MG PO TABS
5.0000 mg | ORAL_TABLET | Freq: Every day | ORAL | Status: DC
  Administered 2023-01-27: 5 mg via ORAL
  Filled 2023-01-27: qty 1

## 2023-01-27 NOTE — Plan of Care (Signed)
  Problem: Education: Goal: Knowledge of disease or condition will improve Outcome: Not Progressing Goal: Knowledge of secondary prevention will improve (MUST DOCUMENT ALL) Outcome: Not Progressing Goal: Knowledge of patient specific risk factors will improve (Mark N/A or DELETE if not current risk factor) Outcome: Not Progressing   Problem: Intracerebral Hemorrhage Tissue Perfusion: Goal: Complications of Intracerebral Hemorrhage will be minimized Outcome: Not Progressing   Problem: Coping: Goal: Will verbalize positive feelings about self Outcome: Not Progressing Goal: Will identify appropriate support needs Outcome: Not Progressing   Problem: Health Behavior/Discharge Planning: Goal: Ability to manage health-related needs will improve Outcome: Not Progressing Goal: Goals will be collaboratively established with patient/family Outcome: Not Progressing   Problem: Self-Care: Goal: Ability to participate in self-care as condition permits will improve Outcome: Not Progressing Goal: Verbalization of feelings and concerns over difficulty with self-care will improve Outcome: Not Progressing Goal: Ability to communicate needs accurately will improve Outcome: Not Progressing   Problem: Nutrition: Goal: Risk of aspiration will decrease Outcome: Not Progressing Goal: Dietary intake will improve Outcome: Not Progressing   Problem: Education: Goal: Knowledge of General Education information will improve Description: Including pain rating scale, medication(s)/side effects and non-pharmacologic comfort measures Outcome: Not Progressing   Problem: Health Behavior/Discharge Planning: Goal: Ability to manage health-related needs will improve Outcome: Not Progressing   Problem: Clinical Measurements: Goal: Ability to maintain clinical measurements within normal limits will improve Outcome: Not Progressing Goal: Will remain free from infection Outcome: Not Progressing Goal:  Diagnostic test results will improve Outcome: Not Progressing Goal: Respiratory complications will improve Outcome: Not Progressing Goal: Cardiovascular complication will be avoided Outcome: Not Progressing   Problem: Activity: Goal: Risk for activity intolerance will decrease Outcome: Not Progressing   Problem: Nutrition: Goal: Adequate nutrition will be maintained Outcome: Not Progressing   Problem: Coping: Goal: Level of anxiety will decrease Outcome: Not Progressing   Problem: Elimination: Goal: Will not experience complications related to bowel motility Outcome: Not Progressing Goal: Will not experience complications related to urinary retention Outcome: Not Progressing   Problem: Pain Managment: Goal: General experience of comfort will improve Outcome: Not Progressing   Problem: Safety: Goal: Ability to remain free from injury will improve Outcome: Not Progressing   Problem: Skin Integrity: Goal: Risk for impaired skin integrity will decrease Outcome: Not Progressing   

## 2023-01-27 NOTE — Progress Notes (Signed)
STROKE TEAM PROGRESS NOTE   INTERVAL HISTORY No family is at the bedside. Pt continue to be lethargic but open eyes on voice, able to tell me her age, not knowing place or month, but following all simple commands. Off cleviprex now, on BP po meds. BP goal < 140.  Vitals:   01/27/23 1030 01/27/23 1100 01/27/23 1200 01/27/23 1300  BP: 126/72 (!) 162/83 (!) 146/77 (!) 142/73  Pulse:  66 75 73  Resp:  16 13 15   Temp:      TempSrc:      SpO2:  98% 96% 97%   CBC:  Recent Labs  Lab 01/25/23 1424 01/26/23 0346 01/27/23 1312  WBC 11.5* 9.7 8.6  NEUTROABS 7.6  --   --   HGB 11.8* 13.1 11.0*  HCT 35.3* 39.7 32.9*  MCV 90.3 91.7 91.1  PLT 335 334 285   Basic Metabolic Panel:  Recent Labs  Lab 01/26/23 0346 01/27/23 1312  NA 135 139  K 3.8 3.7  CL 96* 100  CO2 27 21*  GLUCOSE 131* 100*  BUN 10 17  CREATININE 0.75 0.64  CALCIUM 9.4 7.8*    IMAGING past 24 hours No results found.  PHYSICAL EXAM General - well nourished, well developed, in mild distress of HA.     Ophthalmologic - fundi not visualized due to noncooperation.     Cardiovascular - regular rhythm and rate   Neuro - lethargic, eyes open on voice. She is orientated to age, but not to time or place. No aphasia, limited output, mild dysarthria, following simple commands but psychomotor slowing. Right gaze preference, barely cross midline, tracking on the right visual field, left hemianopia vs. Visual neglect, PERRL. Left facial droop. Tongue midline. RUE at least 4/5, LUE proximal 2/5 and distal hand in fist position, difficulty with extension but hand grip 2-3/5. RLE 3-/5 and LLE withdraw to pain. Sensation symmetrical bilaterally subjectively but seems decreased on the left, right FTN intact but slow, gait not tested.  ASSESSMENT/PLAN [name] is a 77 y.o. 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 CT showed Right Temporal ICH. Placed on Cleviprex gtt for BP control, remains on gtt @ 4. History includes a prior right frontal ICH in 2017 and right parietal hematoma in May 2023. She is wheelchair bound and frequently disoriented at baseline, has lived at [facility] since May 2023. No aneurysm seen on prior MRIs.   ICH - right Temporal ICH, etiology likely due to CAA.   CT head:  New large acute intraparenchymal hemorrhage in the right temporal lobe measuring approximately 5.3 cm in maximum diameter, with local subarachnoid extension. New asymmetry of the right lateral ventricle compared to the left, with potential early entrapment of the temporal horn. No hydrocephalus or midline shift. Repeat CT:  No significant interval change in size and morphology of 4.3 x 3.5 x 2.6 cm (estimated volume 20 mL) intraparenchymal hemorrhage centered at the right temporal lobe. Surrounding edema with trace 3 mm right-to-left shift. No intraventricular extension. No hydrocephalus or trapping 2D Echo: EF 65 to 70% LDL 94 HgbA1c 5.7 VTE prophylaxis - heparin subcu No antithrombotic prior to admission, now on No antithrombotic due to ICH.  Therapy recommendations: SNF Disposition:  pending  History of Seizures History of ICH Patient had right frontal ICH 2017 and  partial seizure but later taking off Keppra.   She had a recurrent right frontal ICH in 01/2022, MRI concerning for CAA.  MRI no aneurysm or AVM, carotid Doppler unremarkable.  EF 60 to 65%, LDL 94, A1c 5.7.  Patient had a focal seizure generalized to GTC on 01/11/2022, received Ativan and loaded with Keppra.  LTM EEG showed subclinical seizure coming from right frontotemporal region.  Put on Keppra 1 g twice daily.   She follows with Dr. Pearlean Brownie at [facility], continue the Keppra.   Now convert from IV Keppra back to PO home dose. Seizure  Precautions  Hypertension Home meds:  losartan 100mg   Unstable Off Cleviprex now Resume losartan 100, put on metoprolol 50 twice daily given tachycardia Add amlodipine 10 SBP goal <140 given CAA Long-term BP goal normotensive  Hyperlipidemia Home meds:  none LDL 94, goal < 70 No statin given ICH due to CAA  Other Stroke Risk Factors Advanced Age >/= 50  Other Active Problems Dementia Day-night cycles Avoid deliriogenic medications memory decline for the last 2 years.  Per chart, since last ICH, her functional status and cognition have both gradually declined. She is now wheelchair-bound and frequently disoriented at baseline.    Hospital day # 2   Marvel Plan, MD PhD Stroke Neurology 01/27/2023 2:44 PM  This patient is critically ill due to recurrent ICH, amyloid angiopathy, history of seizure and at significant risk of neurological worsening, death form recurrent ICH, cerebral edema, brain herniation, status epilepticus. This patient's care requires constant monitoring of vital signs, hemodynamics, respiratory and cardiac monitoring, review of multiple databases, neurological assessment, discussion with family, other specialists and medical decision making of high complexity. I spent 40 minutes of neurocritical care time in the care of this patient. I had long discussion with sisters at bedside, updated pt current condition, treatment plan and potential prognosis, and answered all the questions.  They expressed understanding and appreciation.      To contact Stroke Continuity provider, please refer to [email or web address]. After hours, contact General Neurology

## 2023-01-27 NOTE — Evaluation (Signed)
Physical Therapy Evaluation Patient Details Name: Cynthia [name] MRN: [id number] DOB: 1945-11-09 Today's Date: 01/27/2023  History of Present Illness  Pt is a 77 yr old female admitted 01/25/23 from long term [facility] with headache, recent behavior changes including aggression. CT showed Right Temporal ICH. PMH: breast ca, colon  polyps, dementia, depression, diverticulitis GERD, HTN, seizures, stroke, History includes a prior right frontal ICH in 2017 and right parietal hematoma in May 2023.  Clinical Impression  Patient admitted from SNF with issues noted above.  Difficult to ascertain her baseline as no answer at SNF and sons not fully aware of details of her care.  She is needing +2 A for up to EOB and min to mod A for sitting balance and assist to initiate functional ADL after set up.  Her arousal waxed and waned throughout session.  She may benefit from skilled PT in the acute setting and from return to SNF for further skilled rehab.         Recommendations for follow up therapy are one component of a multi-disciplinary discharge planning process, led by the attending physician.  Recommendations may be updated based on patient status, additional functional criteria and insurance authorization.  Follow Up Recommendations Can patient physically be transported by private vehicle: No     Assistance Recommended at Discharge Frequent or constant Supervision/Assistance  Patient can return home with the following  Two people to help with walking and/or transfers;Two people to help with bathing/dressing/bathroom;Assistance with cooking/housework;Assist for transportation;Direct supervision/assist for medications management;Help with stairs or ramp for entrance    Equipment Recommendations None recommended by PT  Recommendations for Other Services       Functional Status Assessment Patient has had a recent decline in their functional status and demonstrates the ability to make  significant improvements in function in a reasonable and predictable amount of time.     Precautions / Restrictions Precautions Precautions: Fall Precaution Comments: SBP 130-150; chronic L hemiparesis Restrictions Weight Bearing Restrictions: No      Mobility  Bed Mobility Overal bed mobility: Needs Assistance Bed Mobility: Supine to Sit, Sit to Supine     Supine to sit: Max assist, +2 for safety/equipment, +2 for physical assistance, HOB elevated Sit to supine: +2 for physical assistance, +2 for safety/equipment, Total assist   General bed mobility comments: max verbal and physical cues for sequencing, decreased initiation leading to max A +2 for all aspects of bed mobility.    Transfers                   General transfer comment: NT this session for safety    Ambulation/Gait                  Stairs            Wheelchair Mobility    Modified Rankin (Stroke Patients Only) Modified Rankin (Stroke Patients Only) Pre-Morbid Rankin Score: Severe disability Modified Rankin: Severe disability     Balance Overall balance assessment: Needs assistance Sitting-balance support: Single extremity supported, Feet supported Sitting balance-Leahy Scale: Poor Sitting balance - Comments: initially max A progressed to min A; seated EOB to brush her teeth with set up and hand over hand to initiate with OT Postural control: Posterior lean                                   Pertinent Vitals/Pain Pain Assessment Pain Assessment:  Faces Faces Pain Scale: No hurt    Home Living Family/patient expects to be discharged to:: Skilled nursing facility     Type of Home: Skilled Nursing Facility             Additional Comments: spoke with both sons who could not speak to PLOF, LVM with [facility] to try and determine PLOF    Prior Function Prior Level of Function : Needs assist;History of Falls (last six months)             Mobility  Comments: wc bound per chart, son Greig Castilla) says that he thinks she needs a lot of assist for bed mobility and to get into the [facility] ADLs Comments: Per son Greig Castilla he assumes that she needs assist for bathing and dressing - can feed herself     Hand Dominance   Dominant Hand: Right    Extremity/Trunk Assessment   Upper Extremity Assessment Upper Extremity Assessment: Defer to OT evaluation LUE Deficits / Details: hand is swollen and red, contracted at MCPs, PROM for shoulder FF and abduction with resistance from Pt, no noted AROM this session LUE Coordination: decreased fine motor;decreased gross motor (at baseline)    Lower Extremity Assessment Lower Extremity Assessment: RLE deficits/detail;LLE deficits/detail RLE Deficits / Details: AAROM with some stiffness though moves up to about 40 degrees knee flexion on her own in the bed, able to extend knee antigravity but does not hold for testing LLE Deficits / Details: ankle in rigid plantarflexion unable to flex passively, PROM knee and hip difficulty but less rigid than the ankle LLE Coordination: decreased gross motor;decreased fine motor    Cervical / Trunk Assessment Cervical / Trunk Assessment: Kyphotic  Communication   Communication: No difficulties  Cognition Arousal/Alertness: Lethargic Behavior During Therapy: Flat affect Overall Cognitive Status: Impaired/Different from baseline                   Orientation Level: Disoriented to, Place, Time, Situation Current Attention Level: Sustained Memory: Decreased short-term memory Following Commands: Follows one step commands inconsistently, Follows one step commands with increased time Safety/Judgement: Decreased awareness of safety, Decreased awareness of deficits   Problem Solving: Slow processing, Decreased initiation, Difficulty sequencing, Requires verbal cues, Requires tactile cues General Comments: "I'm at the movie theatre", knew she lived [facility], 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 Extra time to follow one step directions inconsistently, decreased initiation. attention waxed and wained throughout session        General Comments General comments (skin integrity, edema, etc.): VSS throughout    Exercises     Assessment/Plan    PT Assessment Patient needs continued PT services  PT Problem List Decreased strength;Decreased balance;Decreased cognition;Decreased mobility;Decreased activity tolerance;Impaired tone       PT Treatment Interventions Functional mobility training;Balance training;Patient/family education;Therapeutic activities;Therapeutic exercise;Cognitive remediation;Neuromuscular re-education    PT Goals (Current goals can be found in the Care Plan section)  Acute Rehab PT Goals Patient Stated Goal: not able to state PT Goal Formulation: Patient unable to participate in goal setting Time For Goal Achievement: 02/10/23 Potential to Achieve Goals: Fair    Frequency Min 2X/week     Co-evaluation PT/OT/SLP Co-Evaluation/Treatment: Yes Reason for Co-Treatment: Complexity of the patient's impairments (multi-system involvement);Necessary to address cognition/behavior during functional activity;For patient/therapist safety;To address functional/ADL transfers PT goals addressed during session: Mobility/safety with mobility;Balance;Strengthening/ROM OT goals addressed during session: ADL's and self-care  AM-PAC PT "6 Clicks" Mobility  Outcome Measure Help needed turning from your back to your side while in a flat bed without using bedrails?: Total Help needed moving from lying on your back to sitting on the side of a flat bed without using bedrails?: Total Help needed moving to and from a bed to a chair (including a wheelchair)?: Total Help needed standing up from a chair using your arms (e.g., wheelchair or bedside chair)?: Total Help needed to walk in hospital room?: Total Help  needed climbing 3-5 steps with a railing? : Total 6 Click Score: 6    End of Session   Activity Tolerance: Patient limited by fatigue Patient left: in bed;with bed alarm set;with call bell/phone within reach Nurse Communication: Mobility status PT Visit Diagnosis: Other symptoms and signs involving the nervous system (R29.898);History of falling (Z91.81);Muscle weakness (generalized) (M62.81);Hemiplegia and hemiparesis Hemiplegia - Right/Left: Left Hemiplegia - dominant/non-dominant: Non-dominant Hemiplegia - caused by: Nontraumatic intracerebral hemorrhage    Time: 4098-1191 PT Time Calculation (min) (ACUTE ONLY): 35 min   Charges:   PT Evaluation $PT Eval Moderate Complexity: 1 Mod          Cyndi Virginie Josten, PT Acute Rehabilitation Services [phone] 01/27/2023   Elray Mcgregor 01/27/2023, 2:06 PM

## 2023-01-27 NOTE — Progress Notes (Signed)
Speech Language Pathology Treatment: Dysphagia  Patient Details Name: Cynthia [name] MRN: [id number] DOB: [birth year] Today's Date: 01/27/2023 Time: 4782-9562 SLP Time Calculation (min) (ACUTE ONLY): 11 min  Assessment / Plan / Recommendation Clinical Impression  Cynthia [name] presented with ongoing lethargy; improved ability to open eyes for brief periods. Continues to present with decreased initiation and delayed response times. She was able to masticate graham crackers with adequate oral attention to left and right sides of mouth; followed with thin liquids with no s/s of aspiration.  She had poor awareness during self-feeding, intermittently bringing hand to mouth in absence of food or cup.   Recommend advancing diet from full liquids to dysphagia 2/thin liquids. Will need full assistance with self-feeding.  SLP will follow.   HPI HPI: Pt is a 77 yr old female presented from SNF on 5/15 with HA and AMS. Dx R temporal ICH. 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  Per chart, since last ICH, her functional status and cognition have both gradually declined. She is now wheelchair-bound and frequently disoriented at baseline. Hx includes seizures, dementia, breast ca, colon polyps, depression, diverticulitis, GERD, HTN.      SLP Plan  Continue with current plan of care      Recommendations for follow up therapy are one component of a multi-disciplinary discharge planning process, led by the attending physician.  Recommendations may be updated based on patient status, additional functional criteria and insurance authorization.    Recommendations  Diet recommendations: Dysphagia 2 (fine chop);Thin liquid Liquids provided via: Cup;Straw Medication Administration: Whole meds with puree Supervision: Full supervision/cueing for compensatory strategies Compensations: Minimize environmental distractions                  Oral care BID   Frequent or  constant Supervision/Assistance Dysphagia, unspecified (R13.10)     Continue with current plan of care    Chris Narasimhan L. [name], MA CCC/SLP Clinical Specialist - Acute Care SLP Acute Rehabilitation Services Office number [phone]  [name] [name]  01/27/2023, 3:44 PM

## 2023-01-27 NOTE — Evaluation (Signed)
Occupational Therapy Evaluation Patient Details Name: Cynthia [name] MRN: [id number] DOB: Oct 02, 1945 Today's Date: 01/27/2023   History of Present Illness Pt is a 77 yr old female admitted 01/25/23 from long term [facility] with headache, recent behavior changes including aggression. CT showed Right Temporal ICH. PMH: breast ca, colon  polyps, dementia, depression, diverticulitis GERD, HTN, seizures, stroke, History includes a prior right frontal ICH in 2017 and right parietal hematoma in May 2023.   Clinical Impression   Pt from [facility] (long term resident) attempted to establish PLOF by calling x3 to [employer] and speaking with 2 sons. Greig Castilla (son) assumes that she required assist for bed mobility, transfers to [facility] and bathroom, assist with bathing/dressing. At this time she is mod to total A for all aspects of ADL. She required physical assist to initiate tasks today and then carried them out with continued multimodal cues with at least mod A. Pt with noted R gaze preference, but with max cues can come to midline. In seated position cervical in heavy R preference, in supine she was the opposite. Noted contractures at MCP on L hand. Pt max to min a to maintain seated balance EOB, did not attempt transfer today. OT will continue to follow acutely and continue to attempt to establish PLOF through [facility].      Recommendations for follow up therapy are one component of a multi-disciplinary discharge planning process, led by the attending physician.  Recommendations may be updated based on patient status, additional functional criteria and insurance authorization.   Assistance Recommended at Discharge Frequent or constant Supervision/Assistance  Patient can return home with the following Two people to help with walking and/or transfers;A lot of help with bathing/dressing/bathroom;Assistance with feeding;Direct supervision/assist for medications management;Direct supervision/assist for  financial management;Assist for transportation;Help with stairs or ramp for entrance    Functional Status Assessment  Patient has had a recent decline in their functional status and demonstrates the ability to make significant improvements in function in a reasonable and predictable amount of time.  Equipment Recommendations  None recommended by OT    Recommendations for Other Services PT consult;Speech consult     Precautions / Restrictions Precautions Precaution Comments: SBP 130-150 Restrictions Weight Bearing Restrictions: No      Mobility Bed Mobility Overal bed mobility: Needs Assistance Bed Mobility: Supine to Sit, Sit to Supine     Supine to sit: Max assist, +2 for safety/equipment, +2 for physical assistance, HOB elevated Sit to supine: +2 for physical assistance, +2 for safety/equipment, Total assist   General bed mobility comments: max verbal and physical cues for sequencing, decreased initiation leading to max A +2 for all aspects of bed mobility.    Transfers                   General transfer comment: NT this session for safety      Balance Overall balance assessment: Needs assistance Sitting-balance support: Single extremity supported, Feet supported Sitting balance-Leahy Scale: Poor Sitting balance - Comments: initially max A progressed to min A Postural control: Posterior lean                                 ADL either performed or assessed with clinical judgement   ADL Overall ADL's : Needs assistance/impaired Eating/Feeding: Maximal assistance   Grooming: Oral care;Wash/dry face;Moderate assistance;Sitting;Cueing for sequencing Grooming Details (indicate cue type and reason): cues and physical assst for  task initiation with supported sitting Upper Body Bathing: Maximal assistance   Lower Body Bathing: Maximal assistance   Upper Body Dressing : Maximal assistance   Lower Body Dressing: Total assistance   Toilet  Transfer: Total assistance;+2 for physical assistance;+2 for safety/equipment Toilet Transfer Details (indicate cue type and reason): bed level purewick in place Toileting- Clothing Manipulation and Hygiene: Maximal assistance;Bed level       Functional mobility during ADLs:  (NT this session) General ADL Comments: Pt requires at least mod A for all aspects of ADL up to total A     Vision   Vision Assessment?: Vision impaired- to be further tested in functional context Additional Comments: Right gaze palsy, barely cross midline, tracking on the right visual field, left hemianopia vs. Visual neglect     Perception     Praxis      Pertinent Vitals/Pain Pain Assessment Pain Assessment: Faces Faces Pain Scale: No hurt Pain Intervention(s): Monitored during session, Repositioned     Hand Dominance Right   Extremity/Trunk Assessment Upper Extremity Assessment Upper Extremity Assessment: LUE deficits/detail LUE Deficits / Details: hand is swollen and red, contracted at MCPs, PROM for shoulder FF and abduction with resistance from Pt, no noted AROM this session LUE Coordination: decreased fine motor;decreased gross motor (at baseline)   Lower Extremity Assessment Lower Extremity Assessment: Defer to PT evaluation       Communication Communication Communication: No difficulties   Cognition Arousal/Alertness: Lethargic, Awake/alert (varied back and forth) Behavior During Therapy: Flat affect Overall Cognitive Status: Impaired/Different from baseline Area of Impairment: Orientation, Attention, Memory, Following commands, Safety/judgement, Problem solving, Awareness                 Orientation Level: Disoriented to, Place, Time, Situation Current Attention Level: Sustained Memory: Decreased short-term memory (per son this is baseline - she does better with long term memories from 30 years ago) Following Commands: Follows one step commands inconsistently, Follows one step  commands with increased time Safety/Judgement: Decreased awareness of safety, Decreased awareness of deficits Awareness: Intellectual Problem Solving: Slow processing, Decreased initiation, Difficulty sequencing, Requires verbal cues, Requires tactile cues General Comments: "I'm at the movie theatre", knew she lived a [facility], 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 Extra time to follow one step directions inconsistently, decreased initiation. attention waxed and wained throughout session     General Comments  VSS throughout session SBP within parameters    Exercises Exercises: Other exercises Other Exercises Other Exercises: bed level PROM of all 4 extremities prior to bed mobility   Shoulder Instructions      Home Living Family/patient expects to be discharged to:: Skilled nursing facility     Type of Home: Skilled Nursing Facility                           Additional Comments: spoke with both sons who could not speak to PLOF, LVM with [facility] to try and determine PLOF      Prior Functioning/Environment Prior Level of Function : Needs assist;History of Falls (last six months)             Mobility Comments: wc bound per chart, son Greig Castilla) says that he thinks she needs a lot of assist for bed mobility and to get into the [facility] ADLs Comments: Per son Greig Castilla he assumes that she needs assist for bathing and dressing - can  feed herself        OT Problem List: Decreased strength;Decreased range of motion;Decreased activity tolerance;Impaired balance (sitting and/or standing);Impaired vision/perception;Decreased cognition;Decreased safety awareness;Impaired UE functional use;Increased edema      OT Treatment/Interventions: Self-care/ADL training;DME and/or AE instruction;Manual therapy;Therapeutic activities;Visual/perceptual remediation/compensation;Patient/family education;Balance training    OT Goals(Current  goals can be found in the care plan section) Acute Rehab OT Goals Patient Stated Goal: none stated OT Goal Formulation: Patient unable to participate in goal setting Time For Goal Achievement: 02/10/23 Potential to Achieve Goals: Fair ADL Goals Pt Will Perform Eating: with supervision;sitting;with adaptive utensils Pt Will Perform Grooming: with set-up;sitting Pt Will Transfer to Toilet: with mod assist;squat pivot transfer Pt Will Perform Toileting - Clothing Manipulation and hygiene: with max assist Additional ADL Goal #1: Pt will perform bed mobility at min A prior to engaging in ADL  OT Frequency: Min 2X/week    Co-evaluation PT/OT/SLP Co-Evaluation/Treatment: Yes Reason for Co-Treatment: Complexity of the patient's impairments (multi-system involvement);Necessary to address cognition/behavior during functional activity;For patient/therapist safety;To address functional/ADL transfers PT goals addressed during session: Mobility/safety with mobility;Balance;Strengthening/ROM        AM-PAC OT "6 Clicks" Daily Activity     Outcome Measure Help from another person eating meals?: A Lot Help from another person taking care of personal grooming?: A Lot Help from another person toileting, which includes using toliet, bedpan, or urinal?: Total Help from another person bathing (including washing, rinsing, drying)?: A Lot Help from another person to put on and taking off regular upper body clothing?: A Lot Help from another person to put on and taking off regular lower body clothing?: Total 6 Click Score: 10   End of Session Nurse Communication: Mobility status  Activity Tolerance: Patient limited by lethargy Patient left: in bed;with call bell/phone within reach;with bed alarm set;with SCD's reapplied (all 4 rails up, safety mats in place)  OT Visit Diagnosis: Unsteadiness on feet (R26.81);Repeated falls (R29.6);History of falling (Z91.81);Low vision, both eyes (H54.2);Other symptoms and  signs involving the nervous system (R29.898);Other symptoms and signs involving cognitive function                Time: 1610-9604 OT Time Calculation (min): 35 min Charges:  OT General Charges $OT Visit: 1 Visit OT Evaluation $OT Eval Moderate Complexity: 1 Mod  [name] OTR/L Acute Rehabilitation Services Office: [phone]  Emelda Fear 01/27/2023, 1:17 PM

## 2023-01-28 DIAGNOSIS — I611 Nontraumatic intracerebral hemorrhage in hemisphere, cortical: Secondary | ICD-10-CM

## 2023-01-28 DIAGNOSIS — D649 Anemia, unspecified: Secondary | ICD-10-CM | POA: Diagnosis not present

## 2023-01-28 DIAGNOSIS — I680 Cerebral amyloid angiopathy: Secondary | ICD-10-CM | POA: Diagnosis not present

## 2023-01-28 DIAGNOSIS — E854 Organ-limited amyloidosis: Secondary | ICD-10-CM

## 2023-01-28 DIAGNOSIS — Z7189 Other specified counseling: Secondary | ICD-10-CM

## 2023-01-28 DIAGNOSIS — Z66 Do not resuscitate: Secondary | ICD-10-CM

## 2023-01-28 LAB — CBC
HCT: 34.1 % — ABNORMAL LOW (ref 36.0–46.0)
Hemoglobin: 11.6 g/dL — ABNORMAL LOW (ref 12.0–15.0)
MCH: 30 pg (ref 26.0–34.0)
MCHC: 34 g/dL (ref 30.0–36.0)
MCV: 88.1 fL (ref 80.0–100.0)
Platelets: 296 10*3/uL (ref 150–400)
RBC: 3.87 MIL/uL (ref 3.87–5.11)
RDW: 12.3 % (ref 11.5–15.5)
WBC: 7.7 10*3/uL (ref 4.0–10.5)
nRBC: 0 % (ref 0.0–0.2)

## 2023-01-28 LAB — BASIC METABOLIC PANEL
Anion gap: 8 (ref 5–15)
BUN: 15 mg/dL (ref 8–23)
CO2: 24 mmol/L (ref 22–32)
Calcium: 8.7 mg/dL — ABNORMAL LOW (ref 8.9–10.3)
Chloride: 102 mmol/L (ref 98–111)
Creatinine, Ser: 0.66 mg/dL (ref 0.44–1.00)
GFR, Estimated: >60 mL/min (ref >60)
Glucose, Bld: 107 mg/dL — ABNORMAL HIGH (ref 70–99)
Potassium: 3.6 mmol/L (ref 3.5–5.1)
Sodium: 134 mmol/L — ABNORMAL LOW (ref 135–145)

## 2023-01-28 LAB — GLUCOSE, CAPILLARY: Glucose-Capillary: 106 mg/dL — ABNORMAL HIGH (ref 70–99)

## 2023-01-28 MED ORDER — SODIUM CHLORIDE 0.9 % IV SOLN: INTRAVENOUS | Status: AC

## 2023-01-28 NOTE — Progress Notes (Signed)
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  She went to sleep on 02/09/2023 and I asked her facility and woke up on 01/24/2023 with a headache at the top of her neck and headache was persistent so the staff sent her to the ED for further evaluation.  The EDP contacted the staff and is reported the patient was having a headache and they also noticed a change in patient's behavior including aggression towards other residents.  She denied any significant residual left-sided weakness from her previous ICH and in the ED a CT scan was done and showed a right temporal ICH.  Blood pressure was elevated on supplemental Cleviprex drip for blood pressure control.  She was admitted by the stroke neurology team and remains lethargic and then was weaned off of Cleviprex.  To transfer the [facility] service on 01/28/2023.  Per family they wanted to speak with palliative care so palliative consult has been placed by the primary team.  She has been admitted for a right ICH likely due to cerebral amyloid angiopathy.  Assessment and Plan: No notes have been filed under this hospital service. Service: Hospitalist  ICH, right temporal likely due to cerebral amyloid angiopathy History of ICH -Head CT done and showed a new acute intraparenchymal hemorrhage in the right temporal lobe measuring approximately 5.3 cm in maximal diameter with a local subarachnoid extension.  There is new asymmetry of the lateral ventricle compared to the left with potentially early entrapment of the left temporal  horn but no hydrocephalus or midline shift noted -She had a repeat head CT done which showed no significant interval change in size and morphology of a 4.3 x 3.5 x 2.6 cm estimated volume 29 mL intraparenchymal hemorrhage centered at the right temporal lobe.  There is surrounding edema with trace 3 mm right to left shift with no intraventricular extension no hydrocephalus or trapping -Underwent stroke workup and had a echocardiogram done -Getting normal saline at 50 MLS per hour and will discontinue 12 more hours -Lipid panel as below -Hemoglobin A1c was 5.7 -Neurology recommending no antithrombotic prior to admission and no antithrombotic now due to ICH -PT/OT recommending SNF -SLP recommending Dysphagia 2 diet with thin liquids -Rapid response overnight given that she is difficult to arouse and a stat head CT and EEG was ordered however after further evaluation she aroused and was interactive after sustaining very stimuli so CT and EEG were canceled -Continue to monitor carefully -Palliative care consulted for further goals of care discussion  Seizure Disorder Hx of Seizures -C/w Seizure Precautions -She was on IV Keppra and changed back to p.o. home dose of 1 g twice daily -Follows up with Dr. Pearlean Brownie at [facility]  HTN -Home Medication of Losartan 100 mg resumed. Neurology now added Metoprolol Tartrate 50 mg po BID given Tachycardia and also added Amlodipine 10 mg po Daily -Now off Cleviprex gtt and this has been discontinued off the [facility] -Continue with IV hydralazine 10 to 20 mg IV every 4 as needed for high blood pressure for blood pressure greater than 140 and labetalol 10 to  20 mg IV every 2 as needed for high blood pressure for blood pressure greater than 140 -Now SBP Goal is <140 given CAA -Continue to Monitor BP per Protocol -Last BP reading was 127/65  Hyperglycemia in setting of Prediabetes -Hemoglobin A1c was 5.7 -Continue monitor CBGs per protocol -CBG Trend:  Recent Labs  Lab  01/28/23 0427  GLUCAP 106*  -Will consider placing on a sensitive NovoLog/scale insulin before meals and at bedtime  Hyponatremia -Mild. Na+ Trend: Recent Labs  Lab 01/25/23 1424 01/26/23 0346 01/27/23 1312 01/28/23 0411  NA 135 135 139 134*  -Continue to Monitor and Trend and repeat CMP in the AM   Metabolic Acidosis -Mild and Improved -CO2 was 21, AG was 18, and Chloride Level was 100 -Now CO2 is 24, AG is 8, Chloride Level is 102 -Continue to Monitor and Trend and repeat CMP in the AM   Hyperlipidemia -Not on any home meds -Goal for her LDL less than 70 From lipid panel done showed a total cholesterol/HDL ratio 2.6, cholesterol 232, HDL 90, LDL 120, triglycerides are 72, VLDL 14 -Per neurology she is not on a statin due to ICH due to CAA  Normocytic Anemia -Hgb/Hct Trend: Recent Labs  Lab 01/25/23 1424 01/26/23 0346 01/27/23 1312 01/28/23 0411  HGB 11.8* 13.1 11.0* 11.6*  HCT 35.3* 39.7 32.9* 34.1*  MCV 90.3 91.7 91.1 88.1  -Check Anemia Panel in the AM -Continue to Monitor for S/Sx of Bleeding; No overt bleeding noted -Repeat CBC in the AM  GERD/GI Prophylaxis -C/w Famotidine 20 mg po BID  DVT prophylaxis: heparin injection 5,000 Units Start: 01/26/23 2315 SCD's Start: 01/25/23 1645    Code Status: Full Code Family Communication: No family present at bedside  Disposition Plan:  Level of care: Telemetry Medical Status is: Inpatient Remains inpatient appropriate because: Needs to go to SNF and needs further clinical improvement and clearance by specialists   Consultants:  Neurology Palliative Care Medicine  Procedures:  As delineated as above  Antimicrobials:  Anti-infectives (From admission, onward)    Start     Dose/Rate Route Frequency Ordered Stop   01/27/23 1130  cefTRIAXone (ROCEPHIN) 1 g in sodium chloride 0.9 % 100 mL IVPB        1 g 200 mL/hr over 30 Minutes Intravenous Every 24 hours 01/27/23 1034 02/01/23 1129        Subjective: Seen and examined at bedside and was somnolent and drowsy but arousable.  Answers my questions and states that she wanted to sleep.  She denies any current pain at this time.  No other concerns or complaints at this time.  Objective: Vitals:   01/28/23 0527 01/28/23 0630 01/28/23 0725 01/28/23 1126  BP: 122/66 132/69 (!) 135/58 127/65  Pulse: 93 93 90 84  Resp:  16 20 14   Temp: 97.8 F (36.6 C) (!) 97.5 F (36.4 C) 98.3 F (36.8 C) 98.9 F (37.2 C)  TempSrc: Axillary Oral Oral Oral  SpO2: 97% 96% 97% 97%    Intake/Output Summary (Last 24 hours) at 01/28/2023 1400 Last data filed at 01/28/2023 0515 Gross per 24 hour  Intake 853.82 ml  Output 525 ml  Net 328.82 ml   There were no vitals filed for this visit.  Examination: Physical Exam:  Constitutional: Chronically ill-appearing elderly Caucasian female who is somnolent and drowsy and lethargic and responds to questioning but states that she wants to sleep Respiratory: Diminished to auscultation bilaterally, no wheezing, rales, rhonchi or crackles. Normal respiratory effort and  patient is not tachypenic. No accessory muscle use.  Unlabored breathing Cardiovascular: RRR, no murmurs / rubs / gallops. S1 and S2 auscultated. No extremity edema. Abdomen: Soft, non-tender, non-distended. Bowel sounds positive.  GU: Deferred. Musculoskeletal: No clubbing / cyanosis of digits/nails. No joint deformity upper and lower extremities.  Skin: No rashes, lesions, ulcers on a limited skin evaluation. No induration; Warm and dry.  Neurologic: Somnolent and drowsy and has some slight dysarthria and did not really want to participate in neuro testing Psychiatric: Slightly impaired judgment   Data Reviewed: I have personally reviewed following labs and imaging studies  CBC: Recent Labs  Lab 01/25/23 1424 01/26/23 0346 01/27/23 1312 01/28/23 0411  WBC 11.5* 9.7 8.6 7.7  NEUTROABS 7.6  --   --   --   HGB 11.8* 13.1 11.0* 11.6*   HCT 35.3* 39.7 32.9* 34.1*  MCV 90.3 91.7 91.1 88.1  PLT 335 334 285 296   Basic Metabolic Panel: Recent Labs  Lab 01/25/23 1424 01/26/23 0346 01/27/23 1312 01/28/23 0411  NA 135 135 139 134*  K 3.3* 3.8 3.7 3.6  CL 97* 96* 100 102  CO2 26 27 21* 24  GLUCOSE 118* 131* 100* 107*  BUN 11 10 17 15   CREATININE 0.73 0.75 0.64 0.66  CALCIUM 9.0 9.4 7.8* 8.7*   GFR: CrCl cannot be calculated (Unknown ideal weight.). Liver Function Tests: Recent Labs  Lab 01/25/23 1424  AST 22  ALT 19  ALKPHOS 77  BILITOT 0.6  PROT 6.4*  ALBUMIN 3.4*   Recent Labs  Lab 01/25/23 1424  LIPASE 21   Recent Labs  Lab 01/25/23 1424  AMMONIA 20   Coagulation Profile: No results for input(s): "INR", "PROTIME" in the last 168 hours. Cardiac Enzymes: No results for input(s): "CKTOTAL", "CKMB", "CKMBINDEX", "TROPONINI" in the last 168 hours. BNP (last 3 results) No results for input(s): "PROBNP" in the last 8760 hours. HbA1C: Recent Labs    01/26/23 1726  HGBA1C 5.3   CBG: Recent Labs  Lab 01/28/23 0427  GLUCAP 106*   Lipid Profile: Recent Labs    01/26/23 1726  CHOL 232*  HDL 90  LDLCALC 128*  TRIG 72  CHOLHDL 2.6   Thyroid Function Tests: No results for input(s): "TSH", "T4TOTAL", "FREET4", "T3FREE", "THYROIDAB" in the last 72 hours. Anemia Panel: No results for input(s): "VITAMINB12", "FOLATE", "FERRITIN", "TIBC", "IRON", "RETICCTPCT" in the last 72 hours. Sepsis Labs: No results for input(s): "PROCALCITON", "LATICACIDVEN" in the last 168 hours.  Recent Results (from the past 240 hour(s))  Urine Culture     Status: Abnormal   Collection Time: 01/25/23  3:09 PM   Specimen: Urine, Clean Catch  Result Value Ref Range Status   Specimen Description URINE, CLEAN CATCH  Final   Special Requests   Final    NONE Performed at [facility], [address], [place], [place] [address]    Culture MULTIPLE SPECIES PRESENT, SUGGEST RECOLLECTION (A)  Final   Report  Status 01/27/2023 FINAL  Final  SARS Coronavirus 2 by RT PCR (hospital order, performed in [facility] hospital lab) *cepheid single result test* Anterior Nasal Swab     Status: None   Collection Time: 01/25/23  7:38 PM   Specimen: Anterior Nasal Swab  Result Value Ref Range Status   SARS Coronavirus 2 by RT PCR NEGATIVE NEGATIVE Final    Comment: Performed at [facility], [address], [place], [place] [address]  MRSA Next Gen by PCR, Nasal  Status: None   Collection Time: 01/25/23  7:38 PM   Specimen: Nasal Mucosa; Nasal Swab  Result Value Ref Range Status   MRSA by PCR Next Gen NOT DETECTED NOT DETECTED Final    Comment: (NOTE) The GeneXpert MRSA Assay (FDA approved for NASAL specimens only), is one component of a comprehensive MRSA colonization surveillance program. It is not intended to diagnose MRSA infection nor to guide or monitor treatment for MRSA infections. Test performance is not FDA approved in patients less than 57 years old. Performed at [facility], [address], [place], [place] [address]     Radiology Studies: No results found.  Scheduled Meds:  amLODipine  10 mg Oral Daily   famotidine  20 mg Oral BID   heparin injection (subcutaneous)  5,000 Units Subcutaneous Q8H   levETIRAcetam  750 mg Oral BID   losartan  100 mg Oral Daily   metoprolol tartrate  50 mg Oral BID   senna-docusate  1 tablet Oral BID   Continuous Infusions:  sodium chloride 50 mL/hr at 01/27/23 2028   cefTRIAXone (ROCEPHIN)  IV 1 g (01/28/23 1121)   clevidipine Stopped (01/27/23 1154)    LOS: 3 days   Marguerita Merles, DO [facility] Available via Epic secure chat 7am-7pm After these hours, please refer to coverage provider listed on [email or web address] 01/28/2023, 2:00 PM

## 2023-01-28 NOTE — Consult Note (Signed)
Palliative Care Consult Note                                  Date: 01/28/2023   Patient Name: Cynthia [name]  DOB: [birth year]  MRN: [id number]  Age / Sex: 77 y.o., female  PCP: Eloisa Northern, MD Referring Physician: Merlene Laughter, DO  Reason for Consultation: Establishing goals of care  HPI/Patient Profile: Palliative Care consult requested for goals of care discussion in this 77 y.o. female  with past medical history of right frontal ICH (2017), recurrent right frontal ICH (01/2022), breast cancer, depression, GERD, hypertension, seizures (Keppra), and dementia. She was admitted on 01/25/2023 from [facility] facility with altered mental status and changes in behavior. CT showed right temporal ICH.    Past Medical History:  Diagnosis Date   Breast cancer [facility])    Colon polyps    Depression    history of   Diverticulitis    GERD (gastroesophageal reflux disease)    Hypertension    Osteoporosis    Seizures (HCC)    Stroke (HCC)      Subjective:   This NP Royal Hawthorn reviewed medical records, received report from team, assessed the patient and then met at the patient's bedside with patient's sister, Cynthia [name] and spoke with son, Cynthia [name] via phone  to discuss diagnosis, prognosis, GOC, EOL wishes disposition and options.  Patient is awake. Sister, Cynthia [name] is feeding in small quantities.    Concept of Palliative Care was introduced as specialized medical care for people and their families living with serious illness.  It focuses on providing relief from the symptoms and stress of a serious illness.  The goal is to improve quality of life for both the patient and the family. Values and goals of care important to patient and family were attempted to be elicited.  I created space and opportunity for patient and family to explore state of health prior to admission, thoughts, and feelings.   Son, Cynthia [name] shares patient has been a resident  at [facility] facility since 01/2022. She was able to communicate however was wheelchair bound per son. She has 2 children. Son, Cynthia [name] resides in [place]. 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   Cynthia [name] shares in past years patient was determined to bounce back. Since recent events there has been stable with no improvement but no worst. At facility she requires assistance with ADLs. Appetite was good.   We discussed Her current illness and what it means in the larger context of Her on-going co-morbidities. Natural disease trajectory and expectations were discussed.  Family verbalized understanding of current illness and co-morbidities. They are realistic in their understanding. Son shares he is remaining hopeful for stability. Understands due to repetitive strokes may not have full recovery. Family clear in expressed wishes to continue to treat the treatable allowing her the opportunity to thrive. Acknowledging that new normal is to be expected.   I discussed the importance of continued conversation with family and their medical providers regarding overall plan of care and treatment options, ensuring decisions are within the context of the patients values and GOCs.  Questions and concerns were addressed.  Hard Choices booklet left for review. The family was encouraged to call with questions or concerns.  PMT will continue to support holistically as needed.   Objective:   Primary Diagnoses: Present on Admission:  ICH (intracerebral hemorrhage) (  HCC)   Scheduled Meds:  amLODipine  10 mg Oral Daily   famotidine  20 mg Oral BID   heparin injection (subcutaneous)  5,000 Units Subcutaneous Q8H   levETIRAcetam  750 mg Oral BID   losartan  100 mg Oral Daily   metoprolol tartrate  50 mg Oral BID   senna-docusate  1 tablet Oral BID    Continuous Infusions:  sodium chloride 50 mL/hr at 01/27/23 2028   cefTRIAXone (ROCEPHIN)  IV 1 g (01/28/23 1121)    clevidipine Stopped (01/27/23 1154)    PRN Meds: acetaminophen **OR** acetaminophen (TYLENOL) oral liquid 160 mg/5 mL **OR** acetaminophen, hydrALAZINE, labetalol, mouth rinse  Allergies  Allergen Reactions   Other Anaphylaxis    FIRE ANTS Insect stings / bites   Acyclovir And Related Other (See Comments)    Unknown reaction   Codeine Other (See Comments)    Syncope    Penicillins Rash    Has patient had a PCN reaction causing immediate rash, facial/tongue/throat swelling, SOB or lightheadedness with hypotension: Unknown Has patient had a PCN reaction causing severe rash involving mucus membranes or skin necrosis: No Has patient had a PCN reaction that required hospitalization No Has patient had a PCN reaction occurring within the last 10 years: No If all of the above answers are "NO", then may proceed with Cephalosporin use.     Review of Systems  Unable to perform ROS: Acuity of condition   Unless otherwise noted, a complete review of systems is negative.  Physical Exam General: NAD, frail chronically-ill appearing Cardiovascular: regular rate and rhythm Pulmonary: diminished bilaterally  Abdomen: soft, nontender, + bowel sounds Extremities: no edema, no joint deformities Skin: no rashes, warm and dry Neurological: impaired  Vital Signs:  BP 127/65 (BP Location: Left Arm)   Pulse 84   Temp 98.9 F (37.2 C) (Oral)   Resp 14   SpO2 97%  Pain Scale: PAINAD   Pain Score: Asleep  SpO2: SpO2: 97 % O2 Device:SpO2: 97 % O2 Flow Rate: .   IO: Intake/output summary:  Intake/Output Summary (Last 24 hours) at 01/28/2023 1426 Last data filed at 01/28/2023 0515 Gross per 24 hour  Intake 853.82 ml  Output 525 ml  Net 328.82 ml    LBM: Last BM Date :  (PTA) Baseline Weight:   Most recent weight:        Palliative Assessment/Data:    Advanced Care Planning:   Primary Decision Maker: HCPOA  Code Status/Advance Care Planning: Full code  A discussion was had  today regarding advanced directives. Concepts specific to code status, artifical feeding and hydration, continued IV antibiotics and rehospitalization was had.  The difference between a aggressive medical intervention path and a palliative comfort care path was discussed.   Son is clear in expressed wishes to continue to treat the treatable. I empathetically approached code status. Son would like to remain a full code. States in an emergent situation would want to make decisions once all family can be present.   Hospice and Palliative Care services outpatient were explained and offered. Patient and family verbalized their understanding and awareness of both palliative and hospice's goals and philosophy of care. Family is not prepared to accept hospice however is open to outpatient palliative support.   Assessment & Plan:   SUMMARY OF RECOMMENDATIONS   Full Code-as confirmed by son, Cynthia [name]. Reports in an emergent event would like time to visit and arrive from [place] before transitioning to comfort.  Continue with current plan  of care. Goals clear and set.  PMT will continue to support and follow as needed. Please call team line with urgent needs.  Symptom Management:  Per Attending  Palliative Prophylaxis:  Delirium Protocol  Additional Recommendations (Limitations, Scope, Preferences): Full Scope Treatment  Psycho-social/Spiritual:  Desire for further Chaplaincy support: no Additional Recommendations:  ongoing support  Prognosis:  Guarded-Poor   Discharge Planning:  Per family goal is to return to [facility] with palliative support.      Patient and family  expressed understanding and was in agreement with this plan.   Time Total: 65 min.   Visit consisted of counseling and education dealing with the complex and emotionally intense issues of symptom management and palliative care in the setting of serious and potentially life-threatening illness.Greater than 50%  of this time  was spent counseling and coordinating care related to the above assessment and plan.  Signed by:  Willette Alma, AGPCNP-BC Palliative [facility]   Phone: [phone] Pager: [phone] Amion: Thea Alken   Thank you for allowing the Palliative Medicine Team to assist in the care of this patient. Please utilize secure chat with additional questions, if there is no response within 30 minutes please call the above phone number. Palliative Medicine Team providers are available by phone from 7am to 5pm daily and can be reached through the team cell phone.  Should this patient require assistance outside of these hours, please call the patient's attending physician.  *Please note that this is a verbal dictation therefore any spelling or grammatical errors are due to the "Dragon Medical One" system interpretation.

## 2023-01-28 NOTE — Progress Notes (Signed)
STROKE TEAM PROGRESS NOTE   INTERVAL HISTORY No issues overnight.  Initially was thought to be lethargic overnight neurologist called but she was arousable and no further workup was pursued  Vitals:   01/28/23 0527 01/28/23 0630 01/28/23 0725 01/28/23 1126  BP: 122/66 132/69 (!) 135/58 127/65  Pulse: 93 93 90 84  Resp:  16 20 14   Temp: 97.8 F (36.6 C) (!) 97.5 F (36.4 C) 98.3 F (36.8 C) 98.9 F (37.2 C)  TempSrc: Axillary Oral Oral Oral  SpO2: 97% 96% 97% 97%   CBC:  Recent Labs  Lab 01/25/23 1424 01/26/23 0346 01/27/23 1312 01/28/23 0411  WBC 11.5*   < > 8.6 7.7  NEUTROABS 7.6  --   --   --   HGB 11.8*   < > 11.0* 11.6*  HCT 35.3*   < > 32.9* 34.1*  MCV 90.3   < > 91.1 88.1  PLT 335   < > 285 296   < > = values in this interval not displayed.    Basic Metabolic Panel:  Recent Labs  Lab 01/27/23 1312 01/28/23 0411  NA 139 134*  K 3.7 3.6  CL 100 102  CO2 21* 24  GLUCOSE 100* 107*  BUN 17 15  CREATININE 0.64 0.66  CALCIUM 7.8* 8.7*     IMAGING past 24 hours No results found.  PHYSICAL EXAM General - well nourished, well developed.  No acute distress     Neuro - lethargic, eyes open on voice. She is orientated to age, but not to time or place. No aphasia, limited output, mild dysarthria, following simple commands but psychomotor slowing. Right gaze preference, barely cross midline, tracking on the right visual field, left hemianopia vs. Visual neglect, PERRL. Left facial droop. Tongue midline. RUE at least 4/5, LUE proximal 2/5 and distal hand in fist position, difficulty with extension but hand grip 2-3/5. RLE 3-/5 and LLE withdraw to pain.  Gait not tested  ASSESSMENT/PLAN Cynthia [name] is a 77 y.o. 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 CT showed Right Temporal ICH. Placed on Cleviprex gtt for BP control, remains on gtt @ 4. History includes a prior right frontal ICH in 2017 and right parietal hematoma in May 2023. She is wheelchair bound and frequently disoriented at baseline, has lived at [facility] since May 2023. No aneurysm seen on prior MRIs.   ICH - right Temporal ICH, etiology likely due to CAA.   CT head:  New large acute intraparenchymal hemorrhage in the right temporal lobe measuring approximately 5.3 cm in maximum diameter, with local subarachnoid extension. New asymmetry of the right lateral ventricle compared to the left, with potential early entrapment of the temporal horn. No hydrocephalus or midline shift. Repeat CT:  No significant interval change in size and morphology of 4.3 x 3.5 x 2.6 cm (estimated volume 20 mL) intraparenchymal hemorrhage centered at the right temporal lobe. Surrounding edema with trace 3 mm right-to-left shift. No intraventricular extension. No hydrocephalus or trapping 2D Echo: EF 65 to 70% LDL 94 HgbA1c 5.7 VTE prophylaxis - heparin subcu No antithrombotic prior to admission, now on No antithrombotic due to ICH.  Therapy recommendations: SNF Disposition:  pending  History of Seizures History of ICH Patient had right frontal ICH 2017 and partial seizure but later taking off  Keppra.   She had a recurrent right frontal ICH in 01/2022, MRI concerning for CAA.  MRI no aneurysm or AVM, carotid Doppler unremarkable.  EF 60 to 65%, LDL 94, A1c 5.7.  Patient had a focal seizure generalized to GTC on 01/11/2022, received Ativan and loaded with Keppra.  LTM EEG showed subclinical seizure coming from right frontotemporal region.  Put on Keppra 1 g twice daily.   She follows with Dr. Pearlean Brownie at [facility], continue the Keppra.   Now convert from IV Keppra back to PO home dose. Seizure Precautions  Hypertension Home meds:  losartan 100mg   Unstable Off Cleviprex now Resume losartan 100, put  on metoprolol 50 twice daily given tachycardia Add amlodipine 10 SBP goal <140 given CAA Long-term BP goal normotensive  Hyperlipidemia Home meds:  none LDL 94, goal < 70 No statin given ICH due to CAA  Other Stroke Risk Factors Advanced Age >/= 81  Other Active Problems Dementia Day-night cycles Avoid deliriogenic medications memory decline for the last 2 years.  Per chart, since last ICH, her functional status and cognition have both gradually declined. She is now wheelchair-bound and frequently disoriented at baseline.    Hospital day # 3  Neurology will sign off.  She can follow-up in stroke clinic after discharge with [facility] with Dr. Pearlean Brownie    To contact Stroke Continuity provider, please refer to [email or web address]. After hours, contact General Neurology

## 2023-01-28 NOTE — Plan of Care (Signed)
  Problem: Nutrition: Goal: Dietary intake will improve Outcome: Progressing   Problem: Nutrition: Goal: Adequate nutrition will be maintained Outcome: Progressing   Problem: Pain Managment: Goal: General experience of comfort will improve Outcome: Progressing   Problem: Safety: Goal: Ability to remain free from injury will improve Outcome: Progressing

## 2023-01-28 NOTE — Progress Notes (Addendum)
Patient difficult to arouse this morning.   STAT head CT has been ordered.   EEG is being ordered.   Addendum: Patient able to be aroused and is interactive after sustained and varied stimuli. CT and EEG are being canceled.   Electronically signed: Dr. Caryl Pina

## 2023-01-28 NOTE — Progress Notes (Signed)
MEWS Progress Note  Patient Details Name: Cynthia [name] MRN: [id number] DOB: 20-Sep-1945 Today's Date: 01/28/2023   MEWS Flowsheet Documentation:  Assess: MEWS Score Temp: (!) 97.5 F (36.4 C) BP: 132/69 MAP (mmHg): 86 Pulse Rate: 93 ECG Heart Rate: 99 Resp: 16 Level of Consciousness: Responds to Pain SpO2: 96 % O2 Device: Room Air Assess: MEWS Score MEWS Temp: 0 MEWS Systolic: 0 MEWS Pulse: 0 MEWS RR: 0 MEWS LOC: 1 MEWS Score: 1 MEWS Score Color: Green Assess: SIRS CRITERIA SIRS Temperature : 0 SIRS Respirations : 0 SIRS Pulse: 1 SIRS WBC: 0 SIRS Score Sum : 1 SIRS Temperature : 0 SIRS Pulse: 1 SIRS Respirations : 0 SIRS WBC: 0 SIRS Score Sum : 1 Assess: if the MEWS score is Yellow or Red Were vital signs taken at a resting state?: Yes Focused Assessment: Change from prior assessment (see assessment flowsheet) Does the patient meet 2 or more of the SIRS criteria?: No MEWS guidelines implemented : Yes, yellow Treat MEWS Interventions: Considered administering scheduled or prn medications/treatments as ordered Take Vital Signs Increase Vital Sign Frequency : Yellow: Q2hr x1, continue Q4hrs until patient remains green for 12hrs Escalate MEWS: Escalate: Yellow: Discuss with charge nurse and consider notifying provider and/or RRT Notify: Charge Nurse/RN Name of Charge Nurse/RN Notified: Phylis RN Provider Notification Provider Name/Title: Georg Ruddle MD Date Provider Notified: 01/28/23 Time Provider Notified: 0501 Method of Notification: Page Notification Reason: Change in status (having hard time waking patient up this morning to do NIH. Pt withdraws from discomfort. plz advice? will notify RRT RN) Provider response: See new orders Date of Provider Response: 01/28/23 Time of Provider Response: 0510 Notify: Rapid Response Name of Rapid Response RN Notified: Mindy Hopper Date Rapid Response Notified: 01/28/23 Time Rapid Response Notified:  [id number]      Mila Palmer 01/28/2023, 6:50 AM

## 2023-01-28 NOTE — Significant Event (Signed)
Rapid Response Event Note   Reason for Call :  Drowisness  Per RN, earlier in night pt able to say her name and birthday and follow commands. She does not like to open her eyes.  Initial Focused Assessment:  Pt lying in bed with eyes closed. She will grimace to painful stimulation. When asked to say her name she says "no." When asked to open her eyes she says "no." After repeated stimulation pt will say her name, birthday, and open her eyes. She also says "I'm tired." She will squeeze both hands and wiggle toes on both feet to command. She has L sided weakness that is baseline. Pupils 4, equal, and brisk.   T-98.7, HR-94, BP-134/65, RR-16, SpO2-95% on RA, CBG-106.  Interventions:  No RRT interventions needed.  Plan of Care:  Pt sleepy but able to follow commands and is alert to self. This is close to her baseline per bedside RN. Please call RRT if further assistance needed.   Event Summary:   MD Notified: Dr. Otelia Limes Call [phone] Arrival Time:0510 End Time:0520  Terrilyn Saver, RN

## 2023-01-28 NOTE — Progress Notes (Signed)
At last q4h NIH check, patient was hard to arouse. Which was a change from previous respond to voice. See flowsheet for assessment. Provider and rapid RN notified. Rapid RN was able to get verbal response with simple command following with painful stimuli. Pt seemed to be wanting to sleep and didn't want to be bothered. CT of head d/c'ed per Provider instruction.   Yellow MEWs protocol implemented.

## 2023-01-29 ENCOUNTER — Other Ambulatory Visit: Payer: Self-pay

## 2023-01-29 ENCOUNTER — Encounter (HOSPITAL_COMMUNITY): Payer: Self-pay | Admitting: Neurology

## 2023-01-29 DIAGNOSIS — D649 Anemia, unspecified: Secondary | ICD-10-CM | POA: Diagnosis not present

## 2023-01-29 DIAGNOSIS — I618 Other nontraumatic intracerebral hemorrhage: Secondary | ICD-10-CM

## 2023-01-29 DIAGNOSIS — E871 Hypo-osmolality and hyponatremia: Secondary | ICD-10-CM | POA: Diagnosis not present

## 2023-01-29 LAB — CBC WITH DIFFERENTIAL/PLATELET
Abs Immature Granulocytes: 0.02 10*3/uL (ref 0.00–0.07)
Basophils Absolute: 0 10*3/uL (ref 0.0–0.1)
Basophils Relative: 0 %
Eosinophils Absolute: 0.1 10*3/uL (ref 0.0–0.5)
Eosinophils Relative: 1 %
HCT: 33.7 % — ABNORMAL LOW (ref 36.0–46.0)
Hemoglobin: 11.3 g/dL — ABNORMAL LOW (ref 12.0–15.0)
Immature Granulocytes: 0 %
Lymphocytes Relative: 21 %
Lymphs Abs: 1.6 10*3/uL (ref 0.7–4.0)
MCH: 30.5 pg (ref 26.0–34.0)
MCHC: 33.5 g/dL (ref 30.0–36.0)
MCV: 91.1 fL (ref 80.0–100.0)
Monocytes Absolute: 0.8 10*3/uL (ref 0.1–1.0)
Monocytes Relative: 11 %
Neutro Abs: 5.2 10*3/uL (ref 1.7–7.7)
Neutrophils Relative %: 67 %
Platelets: 294 10*3/uL (ref 150–400)
RBC: 3.7 MIL/uL — ABNORMAL LOW (ref 3.87–5.11)
RDW: 12.3 % (ref 11.5–15.5)
WBC: 7.7 10*3/uL (ref 4.0–10.5)
nRBC: 0 % (ref 0.0–0.2)

## 2023-01-29 LAB — COMPREHENSIVE METABOLIC PANEL
ALT: 15 U/L (ref 0–44)
AST: 18 U/L (ref 15–41)
Albumin: 3 g/dL — ABNORMAL LOW (ref 3.5–5.0)
Alkaline Phosphatase: 54 U/L (ref 38–126)
Anion gap: 11 (ref 5–15)
BUN: 13 mg/dL (ref 8–23)
CO2: 22 mmol/L (ref 22–32)
Calcium: 8.4 mg/dL — ABNORMAL LOW (ref 8.9–10.3)
Chloride: 102 mmol/L (ref 98–111)
Creatinine, Ser: 0.55 mg/dL (ref 0.44–1.00)
GFR, Estimated: >60 mL/min (ref >60)
Glucose, Bld: 104 mg/dL — ABNORMAL HIGH (ref 70–99)
Potassium: 3.6 mmol/L (ref 3.5–5.1)
Sodium: 135 mmol/L (ref 135–145)
Total Bilirubin: 0.6 mg/dL (ref 0.3–1.2)
Total Protein: 5.7 g/dL — ABNORMAL LOW (ref 6.5–8.1)

## 2023-01-29 LAB — MAGNESIUM: Magnesium: 2.1 mg/dL (ref 1.7–2.4)

## 2023-01-29 LAB — PHOSPHORUS: Phosphorus: 3.5 mg/dL (ref 2.5–4.6)

## 2023-01-29 NOTE — Progress Notes (Signed)
PROGRESS NOTE    Cynthia [name]  ZHY:[id number] DOB: November 09, 1945 DOA: 01/25/2023 PCP: Eloisa Northern, MD   Brief Narrative:  The patient is a 77 year old Caucasian female with a past medical history significant for but limited to right frontal ICH, recurrent right frontal ICH in May 2023, history of anxiety and angiopathy, seizure disorder on Keppra, depression, dementia, history of breast cancer, GERD and other comorbidities who presented to the ED for headache and altered mental status.  She went to sleep on 02/09/2023 and I asked her facility and woke up on 01/24/2023 with a headache at the top of her neck and headache was persistent so the staff sent her to the ED for further evaluation.  The EDP contacted the staff and is reported the patient was having a headache and they also noticed a change in patient's behavior including aggression towards other residents.  She denied any significant residual left-sided weakness from her previous ICH and in the ED a CT scan was done and showed a right temporal ICH.  Blood pressure was elevated on supplemental Cleviprex drip for blood pressure control.  She was admitted by the stroke neurology team and remains lethargic and then was weaned off of Cleviprex.  To transfer the [facility] service on 01/28/2023.  Per family they wanted to speak with palliative care so palliative consult has been placed by the primary team.  She has been admitted for a right ICH likely due to cerebral amyloid angiopathy and completed workup and PT/OT recommending SNF.    Assessment and Plan:  ICH, right temporal likely due to cerebral amyloid angiopathy History of ICH -Head CT done and showed a new acute intraparenchymal hemorrhage in the right temporal lobe measuring approximately 5.3 cm in maximal diameter with a local subarachnoid extension.  There is new asymmetry of the lateral ventricle compared to the left with potentially early entrapment of the left temporal horn but no hydrocephalus or  midline shift noted -She had a repeat head CT done which showed no significant interval change in size and morphology of a 4.3 x 3.5 x 2.6 cm estimated volume 29 mL intraparenchymal hemorrhage centered at the right temporal lobe.  There is surrounding edema with trace 3 mm right to left shift with no intraventricular extension no hydrocephalus or trapping -Underwent stroke workup and had a echocardiogram done -Getting normal saline at 50 MLS per hour and will discontinue 12 more hours -Lipid panel as below -Hemoglobin A1c was 5.7 -Neurology recommending no antithrombotic prior to admission and no antithrombotic now due to ICH -PT/OT recommending SNF and will need TOC assistance with dispositon -SLP recommending Dysphagia 2 diet with thin liquids -Rapid response overnight given that she is difficult to arouse and a stat head CT and EEG was ordered however after further evaluation she aroused and was interactive after sustaining very stimuli so CT and EEG were canceled -Continue to monitor carefully -Palliative care consulted for further goals of care discussion and remains full code with current plan of care to be continued   Seizure Disorder Hx of Seizures -C/w Seizure Precautions -She was on IV Keppra and changed back to p.o. home dose of 1 g twice daily -Follows up with Dr. Pearlean Brownie at [facility]   HTN -Home Medication of Losartan 100 mg resumed. Neurology now added Metoprolol Tartrate 50 mg po BID given Tachycardia and also added Amlodipine 10 mg po Daily -Now off Cleviprex gtt and this has been discontinued off the MAR -Continue with IV hydralazine 10 to 20 mg  IV every 4 as needed for high blood pressure for blood pressure greater than 140 and labetalol 10 to 20 mg IV every 2 as needed for high blood pressure for blood pressure greater than 140 -Now SBP Goal is <140 given CAA -Continue to Monitor BP per Protocol -Last BP reading was 127/65   Hyperglycemia in setting of Prediabetes -Hemoglobin  A1c was 5.7 -Continue monitor CBGs per protocol -CBG Trend:  Recent Labs  Lab 01/28/23 0427  GLUCAP 106*  -Will consider placing on a sensitive NovoLog/scale insulin before meals and at bedtime   Hyponatremia -Mild. Na+ Trend: Recent Labs  Lab 01/25/23 1424 01/26/23 0346 01/27/23 1312 01/28/23 0411 01/29/23 0419  NA 135 135 139 134* 135  -Continue to Monitor and Trend and repeat CMP in the AM    Metabolic Acidosis -Mild and Improved -CO2 was 21, AG was 18, and Chloride Level was 100 on 01/27/23 -Now CO2 is 22, AG is 11, Chloride Level is 102 -Continue to Monitor and Trend and repeat CMP in the AM    Hyperlipidemia -Not on any home meds -Goal for her LDL less than 70 From lipid panel done showed a total cholesterol/HDL ratio 2.6, cholesterol 232, HDL 90, LDL 120, triglycerides are 72, VLDL 14 -Per neurology she is not on a statin due to ICH due to CAA   Normocytic Anemia -Hgb/Hct Trend: Recent Labs  Lab 01/25/23 1424 01/26/23 0346 01/27/23 1312 01/28/23 0411 01/29/23 0419  HGB 11.8* 13.1 11.0* 11.6* 11.3*  HCT 35.3* 39.7 32.9* 34.1* 33.7*  MCV 90.3 91.7 91.1 88.1 91.1  -Check Anemia Panel in the AM -Continue to Monitor for S/Sx of Bleeding; No overt bleeding noted -Repeat CBC in the AM  Hypoalbuminemia -Patient's Albumin Trend: Recent Labs  Lab 01/25/23 1424 01/29/23 0419  ALBUMIN 3.4* 3.0*  -Continue to Monitor and Trend and repeat CMP in the AM  GERD/GI Prophylaxis -C/w Famotidine 20 mg po BID   DVT prophylaxis: heparin injection 5,000 Units Start: 01/26/23 2315 SCD's Start: 01/25/23 1645    Code Status: Full Code Family Communication: No family present at bedside  Disposition Plan:  Level of care: Telemetry Medical Status is: Inpatient Remains inpatient appropriate because: Needs SNF and needs TOC to assist with Disposition   Consultants:  Neurology Palliative Care Medicine   Procedures:  As delineated as above  Antimicrobials:   Anti-infectives (From admission, onward)    Start     Dose/Rate Route Frequency Ordered Stop   01/27/23 1130  cefTRIAXone (ROCEPHIN) 1 g in sodium chloride 0.9 % 100 mL IVPB        1 g 200 mL/hr over 30 Minutes Intravenous Every 24 hours 01/27/23 1034 02/01/23 1129       Subjective: Seen and examined at bedside and she was a little withdrawn and slow to answer but denied any complaints of pain.  Denies any nausea or vomiting.  No other concerns or points at this time.  Objective: Vitals:   01/28/23 2328 01/29/23 0325 01/29/23 0741 01/29/23 1140  BP: (!) 140/72 132/61 129/67 133/65  Pulse: 79 78 70 76  Resp: 16 16 16    Temp: 97.8 F (36.6 C) (!) 97.4 F (36.3 C) 98.6 F (37 C) 99 F (37.2 C)  TempSrc: Oral Oral Oral Axillary  SpO2: 95% 94% 97% 96%    Intake/Output Summary (Last 24 hours) at 01/29/2023 1425 Last data filed at 01/29/2023 1327 Gross per 24 hour  Intake 1245.87 ml  Output 1100 ml  Net 145.87  ml   There were no vitals filed for this visit.  Examination: Physical Exam:  Constitutional: Chronically ill-appearing elderly Caucasian female who remains little somnolent and drowsy but answers questions slowly Respiratory: Diminished to auscultation bilaterally with some coarse breath sounds, no wheezing, rales, rhonchi or crackles. Normal respiratory effort and patient is not tachypenic. No accessory muscle use.  Unlabored breathing Cardiovascular: RRR, no murmurs / rubs / gallops. S1 and S2 auscultated.  Minimal extremity edema Abdomen: Soft, non-tender, non-distended. Bowel sounds positive.  GU: Deferred. Musculoskeletal: No clubbing / cyanosis of digits/nails. No joint deformity upper and lower extremities.  Skin: No rashes, lesions, ulcers on a limited skin evaluation. No induration; Warm and dry.  Neurologic: She is awake and responds slower with some dysarthria Psychiatric: Impaired judgment and insight  Data Reviewed: I have personally reviewed following  labs and imaging studies  CBC: Recent Labs  Lab 01/25/23 1424 01/26/23 0346 01/27/23 1312 01/28/23 0411 01/29/23 0419  WBC 11.5* 9.7 8.6 7.7 7.7  NEUTROABS 7.6  --   --   --  5.2  HGB 11.8* 13.1 11.0* 11.6* 11.3*  HCT 35.3* 39.7 32.9* 34.1* 33.7*  MCV 90.3 91.7 91.1 88.1 91.1  PLT 335 334 285 296 294   Basic Metabolic Panel: Recent Labs  Lab 01/25/23 1424 01/26/23 0346 01/27/23 1312 01/28/23 0411 01/29/23 0419  NA 135 135 139 134* 135  K 3.3* 3.8 3.7 3.6 3.6  CL 97* 96* 100 102 102  CO2 26 27 21* 24 22  GLUCOSE 118* 131* 100* 107* 104*  BUN 11 10 17 15 13   CREATININE 0.73 0.75 0.64 0.66 0.55  CALCIUM 9.0 9.4 7.8* 8.7* 8.4*  MG  --   --   --   --  2.1  PHOS  --   --   --   --  3.5   GFR: CrCl cannot be calculated (Unknown ideal weight.). Liver Function Tests: Recent Labs  Lab 01/25/23 1424 01/29/23 0419  AST 22 18  ALT 19 15  ALKPHOS 77 54  BILITOT 0.6 0.6  PROT 6.4* 5.7*  ALBUMIN 3.4* 3.0*   Recent Labs  Lab 01/25/23 1424  LIPASE 21   Recent Labs  Lab 01/25/23 1424  AMMONIA 20   Coagulation Profile: No results for input(s): "INR", "PROTIME" in the last 168 hours. Cardiac Enzymes: No results for input(s): "CKTOTAL", "CKMB", "CKMBINDEX", "TROPONINI" in the last 168 hours. BNP (last 3 results) No results for input(s): "PROBNP" in the last 8760 hours. HbA1C: Recent Labs    01/26/23 1726  HGBA1C 5.3   CBG: Recent Labs  Lab 01/28/23 0427  GLUCAP 106*   Lipid Profile: Recent Labs    01/26/23 1726  CHOL 232*  HDL 90  LDLCALC 128*  TRIG 72  CHOLHDL 2.6   Thyroid Function Tests: No results for input(s): "TSH", "T4TOTAL", "FREET4", "T3FREE", "THYROIDAB" in the last 72 hours. Anemia Panel: No results for input(s): "VITAMINB12", "FOLATE", "FERRITIN", "TIBC", "IRON", "RETICCTPCT" in the last 72 hours. Sepsis Labs: No results for input(s): "PROCALCITON", "LATICACIDVEN" in the last 168 hours.  Recent Results (from the past 240 hour(s))   Urine Culture     Status: Abnormal   Collection Time: 01/25/23  3:09 PM   Specimen: Urine, Clean Catch  Result Value Ref Range Status   Specimen Description URINE, CLEAN CATCH  Final   Special Requests   Final    NONE Performed at [facility], [address], [place], [place] [address]  Culture MULTIPLE SPECIES PRESENT, SUGGEST RECOLLECTION (A)  Final   Report Status 01/27/2023 FINAL  Final  SARS Coronavirus 2 by RT PCR (hospital order, performed in [facility] hospital lab) *cepheid single result test* Anterior Nasal Swab     Status: None   Collection Time: 01/25/23  7:38 PM   Specimen: Anterior Nasal Swab  Result Value Ref Range Status   SARS Coronavirus 2 by RT PCR NEGATIVE NEGATIVE Final    Comment: Performed at [facility], [address] [place], [place] [address]  MRSA Next Gen by PCR, Nasal     Status: None   Collection Time: 01/25/23  7:38 PM   Specimen: Nasal Mucosa; Nasal Swab  Result Value Ref Range Status   MRSA by PCR Next Gen NOT DETECTED NOT DETECTED Final    Comment: (NOTE) The GeneXpert MRSA Assay (FDA approved for NASAL specimens only), is one component of a comprehensive MRSA colonization surveillance program. It is not intended to diagnose MRSA infection nor to guide or monitor treatment for MRSA infections. Test performance is not FDA approved in patients less than 72 years old. Performed at [facility], [address] [place], [place] [address]     Radiology Studies: No results found.  Scheduled Meds:  amLODipine  10 mg Oral Daily   famotidine  20 mg Oral BID   heparin injection (subcutaneous)  5,000 Units Subcutaneous Q8H   levETIRAcetam  750 mg Oral BID   losartan  100 mg Oral Daily   metoprolol tartrate  50 mg Oral BID   senna-docusate  1 tablet Oral BID   Continuous Infusions:  cefTRIAXone (ROCEPHIN)  IV 1 g (01/29/23 1036)    LOS: 4 days   Marguerita Merles, DO [facility] Available via Epic secure chat  7am-7pm After these hours, please refer to coverage provider listed on [email or web address] 01/29/2023, 2:25 PM

## 2023-01-29 NOTE — Progress Notes (Signed)
EKG would not transfer within chart. Paper copy placed in chart.

## 2023-01-29 NOTE — Plan of Care (Signed)
  Problem: Coping: Goal: Will identify appropriate support needs Outcome: Progressing   Problem: Nutrition: Goal: Risk of aspiration will decrease Outcome: Progressing Goal: Dietary intake will improve Outcome: Progressing   

## 2023-01-29 NOTE — Plan of Care (Signed)
  Problem: Nutrition: Goal: Dietary intake will improve Outcome: Progressing   Problem: Education: Goal: Knowledge of General Education information will improve Description: Including pain rating scale, medication(s)/side effects and non-pharmacologic comfort measures Outcome: Progressing   Problem: Coping: Goal: Level of anxiety will decrease Outcome: Progressing   Problem: Skin Integrity: Goal: Risk for impaired skin integrity will decrease Outcome: Progressing   Problem: Safety: Goal: Ability to remain free from injury will improve Outcome: Progressing

## 2023-01-30 ENCOUNTER — Inpatient Hospital Stay (HOSPITAL_COMMUNITY): Payer: Medicare PPO

## 2023-01-30 ENCOUNTER — Other Ambulatory Visit: Payer: Self-pay

## 2023-01-30 DIAGNOSIS — R400 Somnolence: Secondary | ICD-10-CM | POA: Diagnosis not present

## 2023-01-30 DIAGNOSIS — E871 Hypo-osmolality and hyponatremia: Secondary | ICD-10-CM | POA: Diagnosis not present

## 2023-01-30 DIAGNOSIS — I618 Other nontraumatic intracerebral hemorrhage: Secondary | ICD-10-CM | POA: Diagnosis not present

## 2023-01-30 DIAGNOSIS — D649 Anemia, unspecified: Secondary | ICD-10-CM | POA: Diagnosis not present

## 2023-01-30 LAB — BLOOD GAS, ARTERIAL
Acid-Base Excess: 5.2 mmol/L — ABNORMAL HIGH (ref 0.0–2.0)
Bicarbonate: 29 mmol/L — ABNORMAL HIGH (ref 20.0–28.0)
O2 Saturation: 97.1 %
Patient temperature: 37.4
pCO2 arterial: 40 mmHg (ref 32–48)
pH, Arterial: 7.47 — ABNORMAL HIGH (ref 7.35–7.45)
pO2, Arterial: 78 mmHg — ABNORMAL LOW (ref 83–108)

## 2023-01-30 LAB — COMPREHENSIVE METABOLIC PANEL
ALT: 18 U/L (ref 0–44)
AST: 16 U/L (ref 15–41)
Albumin: 3.3 g/dL — ABNORMAL LOW (ref 3.5–5.0)
Alkaline Phosphatase: 62 U/L (ref 38–126)
Anion gap: 12 (ref 5–15)
BUN: 15 mg/dL (ref 8–23)
CO2: 23 mmol/L (ref 22–32)
Calcium: 8.9 mg/dL (ref 8.9–10.3)
Chloride: 99 mmol/L (ref 98–111)
Creatinine, Ser: 0.65 mg/dL (ref 0.44–1.00)
GFR, Estimated: >60 mL/min (ref >60)
Glucose, Bld: 104 mg/dL — ABNORMAL HIGH (ref 70–99)
Potassium: 3.9 mmol/L (ref 3.5–5.1)
Sodium: 134 mmol/L — ABNORMAL LOW (ref 135–145)
Total Bilirubin: 0.5 mg/dL (ref 0.3–1.2)
Total Protein: 6.5 g/dL (ref 6.5–8.1)

## 2023-01-30 LAB — CBC WITH DIFFERENTIAL/PLATELET
Abs Immature Granulocytes: 0.06 10*3/uL (ref 0.00–0.07)
Basophils Absolute: 0 10*3/uL (ref 0.0–0.1)
Basophils Relative: 0 %
Eosinophils Absolute: 0.1 10*3/uL (ref 0.0–0.5)
Eosinophils Relative: 1 %
HCT: 37.8 % (ref 36.0–46.0)
Hemoglobin: 12.7 g/dL (ref 12.0–15.0)
Immature Granulocytes: 1 %
Lymphocytes Relative: 24 %
Lymphs Abs: 2.4 10*3/uL (ref 0.7–4.0)
MCH: 30.6 pg (ref 26.0–34.0)
MCHC: 33.6 g/dL (ref 30.0–36.0)
MCV: 91.1 fL (ref 80.0–100.0)
Monocytes Absolute: 0.8 10*3/uL (ref 0.1–1.0)
Monocytes Relative: 8 %
Neutro Abs: 6.5 10*3/uL (ref 1.7–7.7)
Neutrophils Relative %: 66 %
Platelets: 311 10*3/uL (ref 150–400)
RBC: 4.15 MIL/uL (ref 3.87–5.11)
RDW: 12.1 % (ref 11.5–15.5)
WBC: 9.9 10*3/uL (ref 4.0–10.5)
nRBC: 0 % (ref 0.0–0.2)

## 2023-01-30 LAB — URINALYSIS, ROUTINE W REFLEX MICROSCOPIC
Bacteria, UA: NONE SEEN
Bilirubin Urine: NEGATIVE
Glucose, UA: NEGATIVE mg/dL
Ketones, ur: NEGATIVE mg/dL
Nitrite: NEGATIVE
Protein, ur: NEGATIVE mg/dL
Specific Gravity, Urine: 1.017 (ref 1.005–1.030)
WBC, UA: >50 WBC/hpf (ref 0–5)
pH: 5 (ref 5.0–8.0)

## 2023-01-30 LAB — MAGNESIUM: Magnesium: 2.1 mg/dL (ref 1.7–2.4)

## 2023-01-30 LAB — PHOSPHORUS: Phosphorus: 4.2 mg/dL (ref 2.5–4.6)

## 2023-01-30 MED ORDER — ADULT MULTIVITAMIN W/MINERALS CH
1.0000 | ORAL_TABLET | Freq: Every day | ORAL | Status: DC
  Administered 2023-01-31: 1 via ORAL
  Filled 2023-01-30: qty 1

## 2023-01-30 NOTE — Progress Notes (Signed)
Physical Therapy Treatment Patient Details Name: Cynthia [name] MRN: [id number] DOB: 07/11/1946 Today's Date: 01/30/2023   History of Present Illness Pt is a 77 yr old female admitted 01/25/23 from long term [facility] with headache, recent behavior changes including aggression. CT showed Right Temporal ICH. PMH: breast ca, colon  polyps, dementia, depression, diverticulitis GERD, HTN, seizures, stroke, History includes a prior right frontal ICH in 2017 and right parietal hematoma in May 2023.    PT Comments    Pt assisted in eating lunch by RN upon PT arrival. Pt ate almost her entire lunch less a small about of broccoli. Pt alert, not verbalizing initially but later in session was able to voice 2-3 sentences. Max A +2 needed to come to EOB. Pt initially needed mod A to sit EOB due to heavy R lean. After AAROM exercises and UE guided motion pt progressed to sitting with min A. Pt stood EOB with max A +2, keeping L knee in extension and not bearing wt on that side. Pt assisted back to supine with tot A +2 and falling asleep after session. Patient will benefit from continued inpatient follow up therapy, <3 hours/day. PT will continue to follow.    Recommendations for follow up therapy are one component of a multi-disciplinary discharge planning process, led by the attending physician.  Recommendations may be updated based on patient status, additional functional criteria and insurance authorization.  Follow Up Recommendations  Can patient physically be transported by private vehicle: No    Assistance Recommended at Discharge Frequent or constant Supervision/Assistance  Patient can return home with the following Two people to help with walking and/or transfers;Two people to help with bathing/dressing/bathroom;Assistance with cooking/housework;Assist for transportation;Direct supervision/assist for medications management;Help with stairs or ramp for entrance   Equipment Recommendations   None recommended by PT    Recommendations for Other Services       Precautions / Restrictions Precautions Precautions: Fall Precaution Comments: SBP 130-150; chronic L hemiparesis Restrictions Weight Bearing Restrictions: No     Mobility  Bed Mobility Overal bed mobility: Needs Assistance Bed Mobility: Supine to Sit, Sit to Supine     Supine to sit: Max assist, +2 for safety/equipment, +2 for physical assistance, HOB elevated Sit to supine: +2 for physical assistance, +2 for safety/equipment, Total assist   General bed mobility comments: pt grasping R rail with R hand, max A needed to bring LLE to EOB and to elevate trunk. Tot A +2 to return to supine    Transfers Overall transfer level: Needs assistance Equipment used: 2 person hand held assist Transfers: Sit to/from Stand Sit to Stand: Max assist, +2 physical assistance           General transfer comment: pt kept L knee extended throughout so L foot not on floor. Max A +2 with use of pad under pt's hips for sit>stand. Pt unable to step in standing.    Ambulation/Gait               General Gait Details: unable   Stairs             Wheelchair Mobility    Modified Rankin (Stroke Patients Only) Modified Rankin (Stroke Patients Only) Pre-Morbid Rankin Score: Severe disability Modified Rankin: Severe disability     Balance Overall balance assessment: Needs assistance Sitting-balance support: Single extremity supported, Feet supported Sitting balance-Leahy Scale: Poor Sitting balance - Comments: initially max A due to heavy R lean, progressed to min A. Balance better when not  allowed to grasp rail with R hand. Kyphotic posture. Worked on stretching chest and lifting head in sitting Postural control: Right lateral lean Standing balance support: During functional activity, Bilateral upper extremity supported Standing balance-Leahy Scale: Zero Standing balance comment: max A +2 needed to maintain  standing                            Cognition Arousal/Alertness: Awake/alert Behavior During Therapy: Flat affect Overall Cognitive Status: Impaired/Different from baseline Area of Impairment: Orientation, Attention, Memory, Following commands, Safety/judgement, Problem solving, Awareness                 Orientation Level: Disoriented to, Place, Time, Situation Current Attention Level: Sustained Memory: Decreased short-term memory Following Commands: Follows one step commands inconsistently, Follows one step commands with increased time Safety/Judgement: Decreased awareness of safety, Decreased awareness of deficits Awareness: Intellectual Problem Solving: Slow processing, Decreased initiation, Difficulty sequencing, Requires verbal cues, Requires tactile cues General Comments: attention waxed and waned. No verbalization until asked if she got into a wheelchair before admission and she said, "of course". Also relayed that she liked strawberry ice cream        Exercises Other Exercises Other Exercises: AAROM R knee extension x10 Other Exercises: towel roll placed in L hand due to fingernails marking L palm Other Exercises: trunk flex/ ext in sitting x10    General Comments General comments (skin integrity, edema, etc.): VSS throughout. Pt falling asleep in bed as soon as session ended      Pertinent Vitals/Pain Pain Assessment Pain Assessment: Faces Faces Pain Scale: Hurts little more Breathing: normal Negative Vocalization: none Facial Expression: smiling or inexpressive Body Language: relaxed Consolability: no need to console PAINAD Score: 0 Pain Location: L hand with extension of fingers Pain Descriptors / Indicators: Grimacing Pain Intervention(s): Limited activity within patient's tolerance, Monitored during session    Home Living                          Prior Function            PT Goals (current goals can now be found in the care  plan section) Acute Rehab PT Goals Patient Stated Goal: not able to state PT Goal Formulation: Patient unable to participate in goal setting Time For Goal Achievement: 02/10/23 Potential to Achieve Goals: Fair Progress towards PT goals: Progressing toward goals    Frequency    Min 2X/week      PT Plan Current plan remains appropriate    Co-evaluation              AM-PAC PT "6 Clicks" Mobility   Outcome Measure  Help needed turning from your back to your side while in a flat bed without using bedrails?: Total Help needed moving from lying on your back to sitting on the side of a flat bed without using bedrails?: Total Help needed moving to and from a bed to a chair (including a wheelchair)?: Total Help needed standing up from a chair using your arms (e.g., wheelchair or bedside chair)?: Total Help needed to walk in hospital room?: Total Help needed climbing 3-5 steps with a railing? : Total 6 Click Score: 6    End of Session Equipment Utilized During Treatment: Gait belt Activity Tolerance: Patient limited by fatigue Patient left: in bed;with bed alarm set;with call bell/phone within reach Nurse Communication: Mobility status PT Visit Diagnosis: Other symptoms and signs  involving the nervous system (R29.898);History of falling (Z91.81);Muscle weakness (generalized) (M62.81);Hemiplegia and hemiparesis Hemiplegia - Right/Left: Left Hemiplegia - dominant/non-dominant: Non-dominant Hemiplegia - caused by: Nontraumatic intracerebral hemorrhage     Time: 1237-1254 PT Time Calculation (min) (ACUTE ONLY): 17 min  Charges:  $Therapeutic Activity: 8-22 mins                     Lyanne Co, PT  Acute Rehab Services Secure chat preferred Office [phone]    Lawana Chambers Sterling Mondo 01/30/2023, 1:07 PM

## 2023-01-30 NOTE — Plan of Care (Signed)
  Problem: Coping: Goal: Will identify appropriate support needs Outcome: Progressing   Problem: Nutrition: Goal: Risk of aspiration will decrease Outcome: Progressing Goal: Dietary intake will improve Outcome: Progressing   Problem: Self-Care: Goal: Ability to participate in self-care as condition permits will improve Outcome: Not Progressing Goal: Verbalization of feelings and concerns over difficulty with self-care will improve Outcome: Not Progressing

## 2023-01-30 NOTE — Progress Notes (Signed)
Pt is at CT.

## 2023-01-30 NOTE — Care Management Important Message (Signed)
Important Message  Patient Details  Name: Cynthia [name] MRN: [id number] Date of Birth: 30-Oct-1945   Medicare Important Message Given:  Yes     Dorena Bodo 01/30/2023, 2:44 PM

## 2023-01-30 NOTE — TOC Progression Note (Signed)
Transition of Care [facility]) - Progression Note    Patient Details  Name: Cynthia [name] MRN: [id number] Date of Birth: 19-Dec-1945  [facility] of [facility]) CM/SW Contact  Baldemar Lenis, [place] Phone Number: 01/30/2023, 1:40 PM  Clinical Narrative:   CSW noting deficits to functioning per chart review, not at baseline. CSW submitted request for insurance authorization for SNF, awaiting decision. CSW to follow.    Expected Discharge Plan: Skilled Nursing Facility Barriers to Discharge: Continued Medical Work up, English as a second language teacher  Expected Discharge Plan and Services In-house Referral: Clinical Social Work   Post Acute Care Choice: Skilled Nursing Facility Living arrangements for the past 2 months: Skilled Nursing Facility                                       Social Determinants of Health (SDOH) Interventions SDOH Screenings   Food Insecurity: Patient Unable To Answer (01/29/2023)  Housing: Patient Unable To Answer (01/29/2023)  Transportation Needs: No Transportation Needs (03/05/2021)  Alcohol Screen: Low Risk  (03/05/2021)  Depression (PHQ2-9): Low Risk  (03/05/2021)  Financial Resource Strain: Low Risk  (03/05/2021)  Physical Activity: Sufficiently Active (03/05/2021)  Social Connections: Moderately Isolated (03/05/2021)  Stress: No Stress Concern Present (03/05/2021)  Tobacco Use: Low Risk  (01/29/2023)    Readmission Risk Interventions     No data to display

## 2023-01-30 NOTE — Progress Notes (Signed)
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  She went to sleep on 02/09/2023 and I asked her facility and woke up on 01/24/2023 with a headache at the top of her neck and headache was persistent so the staff sent her to the ED for further evaluation.  The EDP contacted the staff and is reported the patient was having a headache and they also noticed a change in patient's behavior including aggression towards other residents.  She denied any significant residual left-sided weakness from her previous ICH and in the ED a CT scan was done and showed a right temporal ICH.  Blood pressure was elevated on supplemental Cleviprex drip for blood pressure control.  She was admitted by the stroke neurology team and remains lethargic and then was weaned off of Cleviprex.  To transfer the [facility] service on 01/28/2023.  Per family they wanted to speak with palliative care so palliative consult has been placed by the primary team.  She has been admitted for a right ICH likely due to cerebral amyloid angiopathy and completed workup and PT/OT recommending SNF.   Assessment and Plan:  ICH, right temporal likely due to cerebral amyloid angiopathy History of ICH -Head CT done and showed a new acute intraparenchymal hemorrhage in the right temporal lobe measuring approximately 5.3 cm in maximal diameter with a local subarachnoid extension.  There is new asymmetry of the lateral ventricle compared to the left with potentially early entrapment of the left temporal horn but no hydrocephalus or  midline shift noted -She had a repeat head CT done which showed no significant interval change in size and morphology of a 4.3 x 3.5 x 2.6 cm estimated volume 29 mL intraparenchymal hemorrhage centered at the right temporal lobe.  There is surrounding edema with trace 3 mm right to left shift with no intraventricular extension no hydrocephalus or trapping -Underwent stroke workup and had a echocardiogram done -Getting normal saline at 50 MLS per hour and will discontinue 12 more hours -Lipid panel as below -Hemoglobin A1c was 5.7 -Neurology recommending no antithrombotic prior to admission and no antithrombotic now due to ICH -PT/OT recommending SNF and will need TOC assistance with dispositon -SLP recommending Dysphagia 2 diet with thin liquids -Rapid response overnight given that she is difficult to arouse and a stat head CT and EEG was ordered however after further evaluation she aroused and was interactive after sustaining very stimuli so CT and EEG were canceled -Continue to monitor carefully -Palliative care consulted for further goals of care discussion and remains full code with current plan of care to be continued -Patient continues to be somnolent and drowsy so we will work her up for this and obtain a urinalysis, blood cultures, chest x-ray, ABG and repeat her head CT scan to see if there is any changes  Somnolence and drowsiness -Continue monitor and may need to reengage neurology -Workup as above will also check TSH, RPR, B12 and repea ammonia level -Will also obtain an EEG   Seizure Disorder Hx of Seizures -C/w Seizure Precautions -She was on IV Keppra and changed  back to p.o. home dose of 1 g twice daily -Will repeat her EEG given her somnolence and drowsiness -Follows up with Dr. Pearlean Brownie at [facility]   HTN -Home Medication of Losartan 100 mg resumed. Neurology now added Metoprolol Tartrate 50 mg po BID given Tachycardia and also added Amlodipine 10 mg po Daily -Now off Cleviprex  gtt and this has been discontinued off the [facility] -Continue with IV hydralazine 10 to 20 mg IV every 4 as needed for high blood pressure for blood pressure greater than 140 and labetalol 10 to 20 mg IV every 2 as needed for high blood pressure for blood pressure greater than 140 -Now SBP Goal is <140 given CAA -Continue to Monitor BP per Protocol -Last BP reading was 116/68   Hyperglycemia in setting of Prediabetes -Hemoglobin A1c was 5.7 -Continue monitor CBGs per protocol -CBG Trend:  Recent Labs  Lab 01/28/23 0427  GLUCAP 106*  -Will consider placing on a sensitive NovoLog/scale insulin before meals and at bedtime   Hyponatremia -Mild. Na+ Trend: Recent Labs  Lab 01/25/23 1424 01/26/23 0346 01/27/23 1312 01/28/23 0411 01/29/23 0419 01/30/23 0318  NA 135 135 139 134* 135 134*  -Continue to Monitor and Trend and repeat CMP in the AM    Metabolic Acidosis -Mild and Improved -CO2 was 21, AG was 18, and Chloride Level was 100 on 01/27/23 -Now CO2 is 23, AG is 12, Chloride Level is 99 -Continue to Monitor and Trend and repeat CMP in the AM    Hyperlipidemia -Not on any home meds -Goal for her LDL less than 70 From lipid panel done showed a total cholesterol/HDL ratio 2.6, cholesterol 232, HDL 90, LDL 120, triglycerides are 72, VLDL 14 -Per neurology she is not on a statin due to ICH due to CAA   Normocytic Anemia -Hgb/Hct Trend: Recent Labs  Lab 01/25/23 1424 01/26/23 0346 01/27/23 1312 01/28/23 0411 01/29/23 0419 01/30/23 0318  HGB 11.8* 13.1 11.0* 11.6* 11.3* 12.7  HCT 35.3* 39.7 32.9* 34.1* 33.7* 37.8  MCV 90.3 91.7 91.1 88.1 91.1 91.1  -Check Anemia Panel in the AM -Continue to Monitor for S/Sx of Bleeding; No overt bleeding noted -Repeat CBC in the AM   Hypoalbuminemia -Patient's Albumin Trend: Recent Labs  Lab 01/25/23 1424 01/29/23 0419 01/30/23 0318  ALBUMIN 3.4* 3.0* 3.3*  -Continue to Monitor and Trend and repeat CMP in the AM   GERD/GI  Prophylaxis -C/w Famotidine 20 mg po BID  DVT prophylaxis: heparin injection 5,000 Units Start: 01/26/23 2315 SCD's Start: 01/25/23 1645    Code Status: Full Code Family Communication: Discussed with the patient's Son over the telephone  Disposition Plan:  Level of care: Telemetry Medical Status is: Inpatient Remains inpatient appropriate because: Clinical improvement and clearance and will evaluate her somnolence and if stable can likely discharge back to SNF once insurance authorization is obtained   Consultants:  Neurology PCCM Transfer  Procedures:  As delineated as above  Antimicrobials:  Anti-infectives (From admission, onward)    Start     Dose/Rate Route Frequency Ordered Stop   01/27/23 1130  cefTRIAXone (ROCEPHIN) 1 g in sodium chloride 0.9 % 100 mL IVPB        1 g 200 mL/hr over 30 Minutes Intravenous Every 24 hours 01/27/23 1034 02/01/23 1129       Subjective: Seen and examined at bedside she is extremely somnolent and drowsy today.  Was resting and did not really want to interact.  No other concerns or complaints this  time.  Objective: Vitals:   01/30/23 0829 01/30/23 1012 01/30/23 1124 01/30/23 1520  BP:   126/69 116/68  Pulse:   77 89  Resp:   16 17  Temp:   99.3 F (37.4 C) 98.8 F (37.1 C)  TempSrc:   Axillary Oral  SpO2:   97% 95%  Weight:  69.1 kg    Height: 5\' 6"  (1.676 m)       Intake/Output Summary (Last 24 hours) at 01/30/2023 1720 Last data filed at 01/30/2023 0830 Gross per 24 hour  Intake --  Output 1450 ml  Net -1450 ml   Filed Weights   01/30/23 1012  Weight: 69.1 kg   Examination: Physical Exam:  Constitutional: Chronically ill-appearing elderly Caucasian female who remains somnolent and drowsy and did not really wake up to interact with me today. Respiratory: Diminished to auscultation bilaterally with some coarse breath sounds, no wheezing, rales, rhonchi or crackles. Normal respiratory effort and patient is not tachypenic.  No accessory muscle use.  Unlabored breathing Cardiovascular: RRR, no murmurs / rubs / gallops. S1 and S2 auscultated.  Trace extremity edema Abdomen: Soft, non-tender, non-distended. Bowel sounds positive.  GU: Deferred. Musculoskeletal: No clubbing / cyanosis of digits/nails. No joint deformity upper and lower extremities. Skin: No rashes, lesions, ulcers limited skin evaluation. No induration; Warm and dry.  Neurologic: Extremely somnolent and drowsy and does not really interact with me today or wake up on physical verbal stimuli Psychiatric: Impaired judgment and insight  Data Reviewed: I have personally reviewed following labs and imaging studies  CBC: Recent Labs  Lab 01/25/23 1424 01/26/23 0346 01/27/23 1312 01/28/23 0411 01/29/23 0419 01/30/23 0318  WBC 11.5* 9.7 8.6 7.7 7.7 9.9  NEUTROABS 7.6  --   --   --  5.2 6.5  HGB 11.8* 13.1 11.0* 11.6* 11.3* 12.7  HCT 35.3* 39.7 32.9* 34.1* 33.7* 37.8  MCV 90.3 91.7 91.1 88.1 91.1 91.1  PLT 335 334 285 296 294 311   Basic Metabolic Panel: Recent Labs  Lab 01/26/23 0346 01/27/23 1312 01/28/23 0411 01/29/23 0419 01/30/23 0318  NA 135 139 134* 135 134*  K 3.8 3.7 3.6 3.6 3.9  CL 96* 100 102 102 99  CO2 27 21* 24 22 23   GLUCOSE 131* 100* 107* 104* 104*  BUN 10 17 15 13 15   CREATININE 0.75 0.64 0.66 0.55 0.65  CALCIUM 9.4 7.8* 8.7* 8.4* 8.9  MG  --   --   --  2.1 2.1  PHOS  --   --   --  3.5 4.2   GFR: Estimated Creatinine Clearance: 56.9 mL/min (by C-G formula based on SCr of 0.65 mg/dL). Liver Function Tests: Recent Labs  Lab 01/25/23 1424 01/29/23 0419 01/30/23 0318  AST 22 18 16   ALT 19 15 18   ALKPHOS 77 54 62  BILITOT 0.6 0.6 0.5  PROT 6.4* 5.7* 6.5  ALBUMIN 3.4* 3.0* 3.3*   Recent Labs  Lab 01/25/23 1424  LIPASE 21   Recent Labs  Lab 01/25/23 1424  AMMONIA 20   Coagulation Profile: No results for input(s): "INR", "PROTIME" in the last 168 hours. Cardiac Enzymes: No results for input(s):  "CKTOTAL", "CKMB", "CKMBINDEX", "TROPONINI" in the last 168 hours. BNP (last 3 results) No results for input(s): "PROBNP" in the last 8760 hours. HbA1C: No results for input(s): "HGBA1C" in the last 72 hours. CBG: Recent Labs  Lab 01/28/23 0427  GLUCAP 106*   Lipid Profile: No results for input(s): "CHOL", "HDL", "LDLCALC", "TRIG", "  CHOLHDL", "LDLDIRECT" in the last 72 hours. Thyroid Function Tests: No results for input(s): "TSH", "T4TOTAL", "FREET4", "T3FREE", "THYROIDAB" in the last 72 hours. Anemia Panel: No results for input(s): "VITAMINB12", "FOLATE", "FERRITIN", "TIBC", "IRON", "RETICCTPCT" in the last 72 hours. Sepsis Labs: No results for input(s): "PROCALCITON", "LATICACIDVEN" in the last 168 hours.  Recent Results (from the past 240 hour(s))  Urine Culture     Status: Abnormal   Collection Time: 01/25/23  3:09 PM   Specimen: Urine, Clean Catch  Result Value Ref Range Status   Specimen Description URINE, CLEAN CATCH  Final   Special Requests   Final    NONE Performed at [facility], [address], [place], [place] [address]    Culture MULTIPLE SPECIES PRESENT, SUGGEST RECOLLECTION (A)  Final   Report Status 01/27/2023 FINAL  Final  SARS Coronavirus 2 by RT PCR (hospital order, performed in [facility] hospital lab) *cepheid single result test* Anterior Nasal Swab     Status: None   Collection Time: 01/25/23  7:38 PM   Specimen: Anterior Nasal Swab  Result Value Ref Range Status   SARS Coronavirus 2 by RT PCR NEGATIVE NEGATIVE Final    Comment: Performed at [facility], [address] [place], [place] [address]  MRSA Next Gen by PCR, Nasal     Status: None   Collection Time: 01/25/23  7:38 PM   Specimen: Nasal Mucosa; Nasal Swab  Result Value Ref Range Status   MRSA by PCR Next Gen NOT DETECTED NOT DETECTED Final    Comment: (NOTE) The GeneXpert MRSA Assay (FDA approved for NASAL specimens only), is one component of a comprehensive MRSA colonization  surveillance program. It is not intended to diagnose MRSA infection nor to guide or monitor treatment for MRSA infections. Test performance is not FDA approved in patients less than 40 years old. Performed at [facility], [address], [place], [place] [address]     Radiology Studies: No results found.  Scheduled Meds:  amLODipine  10 mg Oral Daily   famotidine  20 mg Oral BID   heparin injection (subcutaneous)  5,000 Units Subcutaneous Q8H   levETIRAcetam  750 mg Oral BID   losartan  100 mg Oral Daily   metoprolol tartrate  50 mg Oral BID   [START ON 01/31/2023] multivitamin with minerals  1 tablet Oral Daily   senna-docusate  1 tablet Oral BID   Continuous Infusions:  cefTRIAXone (ROCEPHIN)  IV 1 g (01/30/23 1119)    LOS: 5 days   Marguerita Merles, DO [facility] Available via Epic secure chat 7am-7pm After these hours, please refer to coverage provider listed on [email or web address] 01/30/2023, 5:20 PM

## 2023-01-30 NOTE — Progress Notes (Addendum)
At 1950 Patient taken to CT scan by porter.  At 2020 patient back at room  from CT.

## 2023-01-30 NOTE — Progress Notes (Signed)
Initial Nutrition Assessment  DOCUMENTATION CODES:  Not applicable  INTERVENTION:  Continue current diet as ordered Magic cup TID with meals, each supplement provides 290 kcal and 9 grams of protein MVI with minerals daily  NUTRITION DIAGNOSIS:  Inadequate oral intake related to decreased appetite as evidenced by per patient/family report.  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 Wheelchair bound and disoriented at baseline. Imaging showed new right temporal ICH.  5/17 - SLP evaluation, DYS2, thin liquids  Pt resting in bed at the time of assessment awake and conversant. Unsure how accurate nutrition hx is as there is baseline confusion, but pt reports that she has been eating well this admission and that at baseline she eats 3x/d. On exam, pt does appears well nourished with only mild muscle deficits likely associated with normal aging. Noted that pt "likes ice cream" written on board. Will add magic cup to meals  Average Meal Intake: 5/17-5/18: 63% intake x 2 recorded meals  Nutritionally Relevant Medications: Scheduled Meds:  famotidine  20 mg Oral BID   senna-docusate  1 tablet Oral BID   Continuous Infusions:  cefTRIAXone (ROCEPHIN)  IV 1 g (01/29/23 1036)   Labs Reviewed: Na 134 CBG ranges from 104-106 mg/dL over the last 24 hours  NUTRITION - FOCUSED PHYSICAL EXAM: Flowsheet Row Most Recent Value  Orbital Region No depletion  Upper Arm Region No depletion  Thoracic and Lumbar Region No depletion  Buccal Region No depletion  Temple Region No depletion  Clavicle Bone Region No depletion  Clavicle and Acromion Bone Region No depletion  Scapular Bone Region  No depletion  Dorsal Hand Mild depletion  Patellar Region Mild depletion  Anterior Thigh Region Mild depletion  Posterior Calf Region No depletion  Edema (RD Assessment) None  Hair Reviewed  Eyes Reviewed  Mouth Reviewed  Skin Reviewed  Nails Reviewed    Diet Order:   Diet Order             DIET DYS 2 Room service appropriate? Yes with Assist; Fluid consistency: Thin  Diet effective now                   EDUCATION NEEDS:   Not appropriate for education at this time  Skin:  Skin Assessment: Reviewed RN Assessment  Last BM:  5/19 - type 4  Height:  Ht Readings from Last 1 Encounters:  01/30/23 5\' 6"  (1.676 m)    Weight:  Wt Readings from Last 1 Encounters:  01/30/23 69.1 kg    Ideal Body Weight:  59.1 kg  BMI:  Body mass index is 24.59 kg/m.  Estimated Nutritional Needs:  Kcal:  1600-1800 kcal/d Protein:  75-90 g/d Fluid:  1.6-1.8L/d    Greig Castilla, RD, LDN Clinical Dietitian RD pager # available in [facility]  After hours/weekend pager # available in [facility]

## 2023-01-31 ENCOUNTER — Inpatient Hospital Stay (HOSPITAL_COMMUNITY): Payer: Medicare PPO

## 2023-01-31 DIAGNOSIS — R569 Unspecified convulsions: Secondary | ICD-10-CM

## 2023-01-31 DIAGNOSIS — D649 Anemia, unspecified: Secondary | ICD-10-CM | POA: Diagnosis not present

## 2023-01-31 DIAGNOSIS — I618 Other nontraumatic intracerebral hemorrhage: Secondary | ICD-10-CM | POA: Diagnosis not present

## 2023-01-31 DIAGNOSIS — R400 Somnolence: Secondary | ICD-10-CM | POA: Diagnosis not present

## 2023-01-31 LAB — CBC WITH DIFFERENTIAL/PLATELET
Abs Immature Granulocytes: 0.08 10*3/uL — ABNORMAL HIGH (ref 0.00–0.07)
Basophils Absolute: 0 10*3/uL (ref 0.0–0.1)
Basophils Relative: 0 %
Eosinophils Absolute: 0.1 10*3/uL (ref 0.0–0.5)
Eosinophils Relative: 1 %
HCT: 35.2 % — ABNORMAL LOW (ref 36.0–46.0)
Hemoglobin: 11.9 g/dL — ABNORMAL LOW (ref 12.0–15.0)
Immature Granulocytes: 1 %
Lymphocytes Relative: 22 %
Lymphs Abs: 2.1 10*3/uL (ref 0.7–4.0)
MCH: 30.4 pg (ref 26.0–34.0)
MCHC: 33.8 g/dL (ref 30.0–36.0)
MCV: 89.8 fL (ref 80.0–100.0)
Monocytes Absolute: 0.8 10*3/uL (ref 0.1–1.0)
Monocytes Relative: 8 %
Neutro Abs: 6.3 10*3/uL (ref 1.7–7.7)
Neutrophils Relative %: 68 %
Platelets: 334 10*3/uL (ref 150–400)
RBC: 3.92 MIL/uL (ref 3.87–5.11)
RDW: 12.3 % (ref 11.5–15.5)
WBC: 9.3 10*3/uL (ref 4.0–10.5)
nRBC: 0 % (ref 0.0–0.2)

## 2023-01-31 LAB — RPR: RPR Ser Ql: NONREACTIVE

## 2023-01-31 LAB — FERRITIN: Ferritin: 218 ng/mL (ref 11–307)

## 2023-01-31 LAB — FOLATE: Folate: 9.3 ng/mL (ref >5.9)

## 2023-01-31 LAB — COMPREHENSIVE METABOLIC PANEL
ALT: 17 U/L (ref 0–44)
AST: 19 U/L (ref 15–41)
Albumin: 3.3 g/dL — ABNORMAL LOW (ref 3.5–5.0)
Alkaline Phosphatase: 62 U/L (ref 38–126)
Anion gap: 12 (ref 5–15)
BUN: 23 mg/dL (ref 8–23)
CO2: 23 mmol/L (ref 22–32)
Calcium: 9.2 mg/dL (ref 8.9–10.3)
Chloride: 102 mmol/L (ref 98–111)
Creatinine, Ser: 0.63 mg/dL (ref 0.44–1.00)
GFR, Estimated: >60 mL/min (ref >60)
Glucose, Bld: 111 mg/dL — ABNORMAL HIGH (ref 70–99)
Potassium: 4.2 mmol/L (ref 3.5–5.1)
Sodium: 137 mmol/L (ref 135–145)
Total Bilirubin: 0.4 mg/dL (ref 0.3–1.2)
Total Protein: 6.3 g/dL — ABNORMAL LOW (ref 6.5–8.1)

## 2023-01-31 LAB — MAGNESIUM: Magnesium: 2 mg/dL (ref 1.7–2.4)

## 2023-01-31 LAB — IRON AND TIBC
Iron: 122 ug/dL (ref 28–170)
Saturation Ratios: 46 % — ABNORMAL HIGH (ref 10.4–31.8)
TIBC: 266 ug/dL (ref 250–450)
UIBC: 144 ug/dL

## 2023-01-31 LAB — RETICULOCYTES
Immature Retic Fract: 9.7 % (ref 2.3–15.9)
RBC.: 3.92 MIL/uL (ref 3.87–5.11)
Retic Count, Absolute: 68.6 10*3/uL (ref 19.0–186.0)
Retic Ct Pct: 1.8 % (ref 0.4–3.1)

## 2023-01-31 LAB — AMMONIA: Ammonia: 26 umol/L (ref 9–35)

## 2023-01-31 LAB — PHOSPHORUS: Phosphorus: 4.8 mg/dL — ABNORMAL HIGH (ref 2.5–4.6)

## 2023-01-31 LAB — TSH: TSH: 3.803 u[IU]/mL (ref 0.350–4.500)

## 2023-01-31 LAB — CULTURE, BLOOD (ROUTINE X 2): Special Requests: ADEQUATE

## 2023-01-31 LAB — VITAMIN B12: Vitamin B-12: 371 pg/mL (ref 180–914)

## 2023-01-31 MED ORDER — AMLODIPINE BESYLATE 10 MG PO TABS: 10.0000 mg | ORAL_TABLET | Freq: Every day | ORAL | 0 refills | Status: AC

## 2023-01-31 MED ORDER — METOPROLOL TARTRATE 50 MG PO TABS: 50.0000 mg | ORAL_TABLET | Freq: Two times a day (BID) | ORAL | 0 refills | Status: AC

## 2023-01-31 MED ORDER — ADULT MULTIVITAMIN W/MINERALS CH: 1.0000 | ORAL_TABLET | Freq: Every day | ORAL | Status: AC

## 2023-01-31 MED ORDER — SENNOSIDES-DOCUSATE SODIUM 8.6-50 MG PO TABS: 1.0000 | ORAL_TABLET | Freq: Two times a day (BID) | ORAL | 0 refills | Status: AC

## 2023-01-31 NOTE — Progress Notes (Signed)
Occupational Therapy Treatment Patient Details Name: Cynthia [name] MRN: [id number] DOB: [birth year] Today's Date: 01/31/2023   History of present illness Pt is a 77 yr old female admitted 01/25/23 from long term [facility] with headache, recent behavior changes including aggression. CT showed Right Temporal ICH. PMH: breast ca, colon  polyps, dementia, depression, diverticulitis GERD, HTN, seizures, stroke, History includes a prior right frontal ICH in 2017 and right parietal hematoma in May 2023.   OT comments  Pt with slower progress towards OT goals and continues to require +2 assist for bed mobility. Focus on sitting balance with min guard to Mod A, attempts to track/engage with L visual field and participate in simple ADLs EOB though pt with inconsistent command following.    Recommendations for follow up therapy are one component of a multi-disciplinary discharge planning process, led by the attending physician.  Recommendations may be updated based on patient status, additional functional criteria and insurance authorization.    Assistance Recommended at Discharge Frequent or constant Supervision/Assistance  Patient can return home with the following  Two people to help with walking and/or transfers;A lot of help with bathing/dressing/bathroom;Assistance with feeding;Direct supervision/assist for medications management;Direct supervision/assist for financial management;Assist for transportation;Help with stairs or ramp for entrance   Equipment Recommendations  None recommended by OT    Recommendations for Other Services      Precautions / Restrictions Precautions Precautions: Fall Precaution Comments: SBP 130-150; chronic L hemiparesis Restrictions Weight Bearing Restrictions: No       Mobility Bed Mobility Overal bed mobility: Needs Assistance Bed Mobility: Supine to Sit, Sit to Supine     Supine to sit: Total assist, +2 for physical assistance, +2 for  safety/equipment Sit to supine: Total assist, +2 for physical assistance, +2 for safety/equipment   General bed mobility comments: poor initiation, minimal command following; Total A x 2 for all bed mobility    Transfers                         Balance Overall balance assessment: Needs assistance Sitting-balance support: Single extremity supported, Feet supported Sitting balance-Leahy Scale: Poor Sitting balance - Comments: varied Mod A to brief min guard; placed pillow under R hip with improved midline balance Postural control: Right lateral lean                                 ADL either performed or assessed with clinical judgement   ADL Overall ADL's : Needs assistance/impaired     Grooming: Maximal assistance;Sitting;Wash/dry face;Brushing hair Grooming Details (indicate cue type and reason): did wash mouth with cues to initiate but did not wash rest of face despite cues. attempted to have pt locate comb to grasp but did not follow through                               General ADL Comments: Focus on EOB balance, attention to L side and command following basic tasks    Extremity/Trunk Assessment Upper Extremity Assessment Upper Extremity Assessment: LUE deficits/detail LUE Deficits / Details: hand is swollen and red, contracted at MCPs, PROM for shoulder FF and abduction with resistance from Pt, no noted AROM this session LUE Coordination: decreased fine motor;decreased gross motor   Lower Extremity Assessment Lower Extremity Assessment: Defer to PT evaluation        Vision  Vision Assessment?: Vision impaired- to be further tested in functional context Additional Comments: R gaze preference, will briefly track to midline and beyond to L though does not hold more than a few seconds   Perception     Praxis      Cognition Arousal/Alertness: Awake/alert Behavior During Therapy: Flat affect Overall Cognitive Status: Difficult to  assess Area of Impairment: Attention, Memory, Following commands, Safety/judgement, Problem solving, Awareness                   Current Attention Level: Focused Memory: Decreased short-term memory Following Commands: Follows one step commands inconsistently, Follows one step commands with increased time Safety/Judgement: Decreased awareness of safety, Decreased awareness of deficits Awareness: Intellectual Problem Solving: Slow processing, Decreased initiation, Difficulty sequencing, Requires verbal cues, Requires tactile cues General Comments: variable attention, inconsistent answering of questions. did report "yes" to being from [facility], first name preference. Inconsistent command following with decreased insight into deficits        Exercises      Shoulder Instructions       General Comments      Pertinent Vitals/ Pain       Pain Assessment Pain Assessment: Faces Faces Pain Scale: No hurt Pain Intervention(s): Monitored during session  Home Living                                          Prior Functioning/Environment              Frequency  Min 2X/week        Progress Toward Goals  OT Goals(current goals can now be found in the care plan section)  Progress towards OT goals: OT to reassess next treatment  Acute Rehab OT Goals Patient Stated Goal: none stated OT Goal Formulation: Patient unable to participate in goal setting Time For Goal Achievement: 02/10/23 Potential to Achieve Goals: Fair ADL Goals Pt Will Perform Eating: with supervision;sitting;with adaptive utensils Pt Will Perform Grooming: with set-up;sitting Pt Will Transfer to Toilet: with mod assist;squat pivot transfer Pt Will Perform Toileting - Clothing Manipulation and hygiene: with max assist Additional ADL Goal #1: Pt will perform bed mobility at min A prior to engaging in ADL  Plan Discharge plan remains appropriate    Co-evaluation                  AM-PAC OT "6 Clicks" Daily Activity     Outcome Measure   Help from another person eating meals?: A Lot Help from another person taking care of personal grooming?: A Lot Help from another person toileting, which includes using toliet, bedpan, or urinal?: Total Help from another person bathing (including washing, rinsing, drying)?: A Lot Help from another person to put on and taking off regular upper body clothing?: A Lot Help from another person to put on and taking off regular lower body clothing?: Total 6 Click Score: 10    End of Session    OT Visit Diagnosis: Unsteadiness on feet (R26.81);Repeated falls (R29.6);History of falling (Z91.81);Low vision, both eyes (H54.2);Other symptoms and signs involving the nervous system (R29.898);Other symptoms and signs involving cognitive function   Activity Tolerance Patient tolerated treatment well   Patient Left in bed;with call bell/phone within reach;with bed alarm set;with nursing/sitter in room   Nurse Communication Mobility status        Time: 1001-1025 OT Time Calculation (min): 24 min  Charges: OT General Charges $OT Visit: 1 Visit OT Treatments $Self Care/Home Management : 8-22 mins $Therapeutic Activity: 8-22 mins  Bradd Canary, OTR/L Acute Rehab Services Office: [phone]   Cynthia [name] 01/31/2023, 11:18 AM

## 2023-01-31 NOTE — Discharge Summary (Signed)
Physician Discharge Summary   Patient: Branigan Haneline MRN: [id number] DOB: 1945-10-08  Admit date:     01/25/2023  Discharge date: 01/31/23  Discharge Physician: Marguerita Merles, DO   PCP: Eloisa Northern, MD   Recommendations at discharge:    Follow up with PCP weeks and repeat CBC, CMP, mag, Phos within 1 week With neurology in outpatient setting  Discharge Diagnoses: Principal Problem:   ICH (intracerebral hemorrhage) (HCC)  Resolved Problems:   * No resolved hospital problems. 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 She went to sleep on 02/09/2023 and I asked her facility and woke up on 01/24/2023 with a headache at the top of her neck and headache was persistent so the staff sent her to the ED for further evaluation. The EDP contacted the staff and is reported the patient was having a headache and they also noticed a change in patient's behavior including aggression towards other residents. She denied any significant residual left-sided weakness from her previous ICH and in the ED a CT scan was done and showed a right temporal ICH. Blood pressure was elevated on supplemental Cleviprex drip for blood pressure control. She was admitted by the stroke neurology team and remains lethargic and then was weaned off of Cleviprex. To transfer the [facility] service on 01/28/2023. Per family they wanted to speak with palliative care so palliative consult has been placed by the primary team. She has been admitted for a right ICH likely due to cerebral amyloid angiopathy and completed workup and PT/OT recommending SNF.   Imaging and tests was repeated given that she is  somnolent however and she has no further signs of infection.  I asked neurology to evaluate her again they feel that she is stable from their standpoint and they feel that she may take some time to improve but they feel that she is stable for transfer will need to follow-up with PCP and neurology in outpatient setting.  Assessment and Plan:  ICH, right temporal likely due to cerebral amyloid angiopathy History of ICH -Head CT done and showed a new acute intraparenchymal hemorrhage in the right temporal lobe measuring approximately 5.3 cm in maximal diameter with a local subarachnoid extension.  There is new asymmetry of the lateral ventricle compared to the left with potentially early entrapment of the left temporal horn but no hydrocephalus or midline shift noted -She had a repeat head CT done which showed no significant interval change in size and morphology of a 4.3 x 3.5 x 2.6 cm estimated volume 29 mL intraparenchymal hemorrhage centered at the right temporal lobe.  There is surrounding edema with trace 3 mm right to left shift with no intraventricular extension no hydrocephalus or trapping -Repeat head CT on 01/30/2023 done and showed "Unchanged size of large intraparenchymal hematoma centered in the right temporal lobe with mild surrounding edema and 2 mm of leftward midline shift." -Underwent stroke workup and had a echocardiogram done -IV fluid hydration has now stopped -Lipid panel as below -Hemoglobin A1c was 5.7 -Neurology recommending no antithrombotic prior to admission and no antithrombotic now due to ICH -PT/OT recommending SNF and will need TOC assistance with dispositon -SLP recommending Dysphagia 2 diet with  thin liquids -Rapid response overnight given that she is difficult to arouse and a stat head CT and EEG was ordered however after further evaluation she aroused and was interactive after sustaining very stimuli so CT and EEG were canceled -Continue to monitor  carefully -Palliative care consulted for further goals of care discussion and remains full code with current plan of care to be continued -Patient continues to be somnolent and drowsy so we will work her up for this and obtain a urinalysis, blood cultures, chest x-ray, ABG and repeat her head CT scan to see if there is any changes -Urinanalysis done and showed a hazy appearance with yellow color urine, negative glucose, small hemoglobin, large leukocytes, negative nitrites, no bacteria seen, 0-5 RBCs per high-power field, 0-5 squamous epithelial cells and and 50 WBCs, blood cultures x 2 are pending showing no growth to date at less than 12 hours; currently she is afebrile and has no white count -CXR done and showed "No acute cardiopulmonary disease. Chronic elevation of left hemidiaphragm. Slightly prominent soft tissue in the right paratracheal region. This finding could be associated with the low lung volumes and technique but indeterminate. Recommend attention on follow-up chest radiographs."   Somnolence and drowsiness -Continue monitor and her neurologist to evaluate her again and he feels that she is stable and that we will take time for somnolence and drowsiness to improve given her ICH -RPR was negative, TSH was 3.803, B12 was 371 and ammonia level was 26 -ABG done and showed: ABG    Component Value Date/Time   PHART 7.47 (H) 01/30/2023 2113   PCO2ART 40 01/30/2023 2113   PO2ART 78 (L) 01/30/2023 2113   HCO3 29.0 (H) 01/30/2023 2113   TCO2 24 03/20/2016 1250   O2SAT 97.1 01/30/2023 2113  -Repeat EEG done and was suggestive of cortical dysfunction arising from the right hemisphere likely secondary to underlying structural abnormality but there is additionally moderate diffuse encephalopathy but no seizures or epileptiform discharges seen throughout the recording -Neurology reengaged and they are considering adding stimulants for the patient but feel that she is stable from their  perspective and stable for discharge to SNF and feels that her somnolence and drowsiness will improve slowly over time   Seizure Disorder Hx of Seizures -C/w Seizure Precautions -She was on IV Keppra and changed back to p.o. home dose of 1 g twice daily -Will repeat her EEG given her somnolence and drowsiness -Follows up with Dr. Pearlean Brownie at [facility] and I asked him to see her today and he feels she is stable for D/C    HTN -Home Medication of Losartan 100 mg resumed. Neurology now added Metoprolol Tartrate 50 mg po BID given Tachycardia and also added Amlodipine 10 mg po Daily -Now off Cleviprex gtt and this has been discontinued off the [facility] -Continue with IV hydralazine 10 to 20 mg IV every 4 as needed for high blood pressure for blood pressure greater than 140 and labetalol 10 to 20 mg IV every 2 as needed for high blood pressure for blood pressure greater than 140 -Now SBP Goal is <140 given CAA -Continue to Monitor BP per Protocol -Last BP reading was 129/66   Hyperglycemia in setting of Prediabetes -Hemoglobin A1c was 5.7 -Continue monitor CBGs per protocol -CBG Trend:  Recent Labs  Lab 01/28/23 0427  GLUCAP 106*  -Will consider placing on a sensitive NovoLog/scale insulin before meals and at bedtime   Hyponatremia -Mild. Na+ Trend: Recent Labs  Lab 01/25/23 1424 01/26/23  1610 01/27/23 1312 01/28/23 0411 01/29/23 0419 01/30/23 0318 01/31/23 0623  NA 135 135 139 134* 135 134* 137  -Continue to Monitor and Trend and repeat CMP in the AM    Metabolic Acidosis -Mild and Improved -CO2 was 21, AG was 18, and Chloride Level was 100 on 01/27/23 -Now CO2 is 23, AG is 12, Chloride Level is 102 -Continue to Monitor and Trend and repeat CMP in the AM    Hyperlipidemia -Not on any home meds -Goal for her LDL less than 70 From lipid panel done showed a total cholesterol/HDL ratio 2.6, cholesterol 232, HDL 90, LDL 120, triglycerides are 72, VLDL 14 -Per neurology she is not on a  statin due to ICH due to CAA   Normocytic Anemia -Hgb/Hct Trend: Recent Labs  Lab 01/25/23 1424 01/26/23 0346 01/27/23 1312 01/28/23 0411 01/29/23 0419 01/30/23 0318 01/31/23 0623  HGB 11.8* 13.1 11.0* 11.6* 11.3* 12.7 11.9*  HCT 35.3* 39.7 32.9* 34.1* 33.7* 37.8 35.2*  MCV 90.3 91.7 91.1 88.1 91.1 91.1 89.8  -Checked Anemia Panel showed an iron level of 122, UIBC 144, TIBC 266, saturation ratio 46%, ferritin level 218, folate level 9.3, vitamin B12 371 -Continue to Monitor for S/Sx of Bleeding; No overt bleeding noted -Repeat CBC in the AM   Hypoalbuminemia -Patient's Albumin Trend: Recent Labs  Lab 01/25/23 1424 01/29/23 0419 01/30/23 0318 01/31/23 0623  ALBUMIN 3.4* 3.0* 3.3* 3.3*  -Continue to Monitor and Trend and repeat CMP in the AM   GERD/GI Prophylaxis -C/w Famotidine 20 mg po BID  Nutrition Documentation    Flowsheet Row ED to Hosp-Admission (Current) from 01/25/2023 in [facility]  Nutrition Problem Inadequate oral intake  Etiology decreased appetite  Nutrition Goal Patient will meet greater than or equal to 90% of their needs  Interventions Refer to RD note for recommendations      Consultants: Neurology, palliative care medicine  Procedures performed: As delineated as above Disposition: Skilled nursing facility Diet recommendation:  Discharge Diet Orders (From admission, onward)     Start     Ordered   01/31/23 0000  Diet - low sodium heart healthy        01/31/23 1302   01/31/23 0000  Diet general       Comments: Dysphagia 2 with Meds whole in Puree   01/31/23 1302           Dysphagia type 2 Thin Liquid; Whole Med with Puree  DISCHARGE MEDICATION: Allergies as of 01/31/2023       Reactions   Other Anaphylaxis   FIRE ANTS Insect stings / bites   Acyclovir And Related Other (See Comments)   Unknown reaction   Codeine Other (See Comments)   Syncope   Penicillins Rash   Has patient had a PCN reaction causing  immediate rash, facial/tongue/throat swelling, SOB or lightheadedness with hypotension: Unknown Has patient had a PCN reaction causing severe rash involving mucus membranes or skin necrosis: No Has patient had a PCN reaction that required hospitalization No Has patient had a PCN reaction occurring within the last 10 years: No If all of the above answers are "NO", then may proceed with Cephalosporin use.        Medication List     STOP taking these medications    ibuprofen 400 MG tablet Commonly known as: ADVIL   tiZANidine 2 MG tablet Commonly known as: ZANAFLEX   tiZANidine 4 MG tablet Commonly known as: ZANAFLEX  TAKE these medications    acetaminophen 500 MG tablet Commonly known as: TYLENOL Take 2 tablets (1,000 mg total) by mouth every 6 (six) hours as needed for mild pain, fever or headache.   amLODipine 10 MG tablet Commonly known as: NORVASC Take 1 tablet (10 mg total) by mouth daily. Start taking on: Feb 01, 2023   cetirizine 5 MG chewable tablet Commonly known as: ZYRTEC Chew 5 mg by mouth daily.   famotidine 20 MG tablet Commonly known as: PEPCID Take 1 tablet (20 mg total) by mouth 2 (two) times daily.   ferrous sulfate 325 (65 FE) MG tablet Take 325 mg by mouth daily.   fluticasone 50 MCG/ACT nasal spray Commonly known as: FLONASE Place 1 spray into both nostrils daily.   levETIRAcetam 750 MG tablet Commonly known as: KEPPRA Take 750 mg by mouth 2 (two) times daily.   losartan 100 MG tablet Commonly known as: COZAAR TAKE 1 TABLET BY MOUTH DAILY FOR BLOOD PRESSURE   memantine 10 MG tablet Commonly known as: Namenda Take 1 tablet (10 mg total) by mouth 2 (two) times daily.   metoprolol tartrate 50 MG tablet Commonly known as: LOPRESSOR Take 1 tablet (50 mg total) by mouth 2 (two) times daily.   multivitamin with minerals Tabs tablet Take 1 tablet by mouth daily. Start taking on: Feb 01, 2023   senna-docusate 8.6-50 MG  tablet Commonly known as: Senokot-S Take 1 tablet by mouth 2 (two) times daily.   venlafaxine XR 150 MG 24 hr capsule Commonly known as: EFFEXOR-XR Take 150 mg by mouth daily with breakfast.       Discharge Exam: Filed Weights   01/30/23 1012  Weight: 69.1 kg   Vitals:   2023/02/04 0741 04-Feb-2023 1100  BP: 134/67 129/66  Pulse: 88 87  Resp: 18 18  Temp: 98.3 F (36.8 C) 98.3 F (36.8 C)  SpO2: 97% 98%   Examination: Physical Exam:  Constitutional: WN/WD chronically ill-appearing Caucasian female who is a little somnolent and drowsy with her eyes closed but does follow commands Respiratory: Diminished to auscultation bilaterally, no wheezing, rales, rhonchi or crackles. Normal respiratory effort and patient is not tachypenic. No accessory muscle use.  Unlabored breathing Cardiovascular: RRR, no murmurs / rubs / gallops. S1 and S2 auscultated. No extremity edema.  Abdomen: Soft, non-tender, non-distended. Bowel sounds positive.  GU: Deferred. Musculoskeletal: No clubbing / cyanosis of digits/nails. No joint deformity upper and lower extremities.  Skin: No rashes, lesions, ulcers on a limited skin evaluation. No induration; Warm and dry.  Neurologic: Appears somnolent and drowsy but does follow some commands Psychiatric: Impaired judgment and insight  Condition at discharge: stable  The results of significant diagnostics from this hospitalization (including imaging, microbiology, ancillary and laboratory) are listed below for reference.   Imaging Studies: EEG adult  Result Date: Feb 04, 2023 Charlsie Quest, MD     04-Feb-2023  8:50 AM Patient Name: Natalynn Kortan MRN: [id number] Epilepsy Attending: Charlsie Quest Referring Physician/Provider: Merlene Laughter, DO Date: 01/30/2023 Duration: 26.20 mins Patient history: 77 year old female with a past medical history significant for but limited to right frontal ICH, recurrent right frontal ICH in May 2023, history of anxiety  and angiopathy, seizure disorder on Keppra, depression, dementia, history of breast cancer, GERD and other comorbidities who presented to the ED for headache and altered mental status. EEG to evaluate for seizure. Level of alertness: Awake, asleep AEDs during EEG study: LEV Technical aspects: This EEG study was done with scalp  electrodes positioned according to the 10-20 International system of electrode placement. Electrical activity was reviewed with band pass filter of 1-70Hz , sensitivity of 7 uV/mm, display speed of 25mm/sec with a 60Hz  notched filter applied as appropriate. EEG data were recorded continuously and digitally stored.  Video monitoring was available and reviewed as appropriate. Description: No clear posterior dominant rhythm was seen. Sleep was characterized by sleep spindles (12 to 14 Hz), maximal frontocentral region. EEG showed continuous generalized and lateralized right hemisphere 3 to 6 Hz theta-delta slowing. Hyperventilation and photic stimulation were not performed.   ABNORMALITY - Continuous slow, generalized and lateralized right hemisphere IMPRESSION: This study is suggestive of cortical dysfunction arising from right hemisphere likely secondary to underlying structural abnormality. Additionally there is moderate diffuse encephalopathy. No seizures or epileptiform discharges were seen throughout the recording. Charlsie Quest   DG CHEST PORT 1 VIEW  Result Date: 01/31/2023 CLINICAL DATA:  Somnolence.  Intracranial hemorrhage. EXAM: PORTABLE CHEST 1 VIEW COMPARISON:  09/28/2021 FINDINGS: Chronic elevation of left hemidiaphragm. Surgical clips in the left breast. Slightly decreased lung volumes without focal disease. Heart size is within normal limits and stable. Mild leftward deviation of the trachea with slightly prominent soft tissue in the right paratracheal region. This finding could be associated with the technique and low lung volumes but nonspecific. Negative for a  pneumothorax. No gross bone abnormality. IMPRESSION: 1. No acute cardiopulmonary disease. 2. Chronic elevation of left hemidiaphragm. 3. Slightly prominent soft tissue in the right paratracheal region. This finding could be associated with the low lung volumes and technique but indeterminate. Recommend attention on follow-up chest radiographs. Electronically Signed   By: Richarda Overlie M.D.   On: 01/31/2023 08:33   CT HEAD WO CONTRAST ( )  Result Date: 01/30/2023 CLINICAL DATA:  Stroke EXAM: CT HEAD WITHOUT CONTRAST TECHNIQUE: Contiguous axial images were obtained from the base of the skull through the vertex without intravenous contrast. RADIATION DOSE REDUCTION: This exam was performed according to the departmental dose-optimization program which includes automated exposure control, adjustment of the mA and/or kV according to patient size and/or use of iterative reconstruction technique. COMPARISON:  None Available. FINDINGS: Brain: Unchanged size of large intraparenchymal hematoma centered in the right temporal lobe with expected decreased density. Unchanged mild surrounding edema with 2 mm of leftward midline shift. There is periventricular hypoattenuation compatible with chronic microvascular disease. Generalized atrophy. Vascular: No abnormal hyperdensity of the major intracranial arteries or dural venous sinuses. No intracranial atherosclerosis. Skull: The visualized skull base, calvarium and extracranial soft tissues are normal. Sinuses/Orbits: No fluid levels or advanced mucosal thickening of the visualized paranasal sinuses. No mastoid or middle ear effusion. The orbits are normal. IMPRESSION: Unchanged size of large intraparenchymal hematoma centered in the right temporal lobe with mild surrounding edema and 2 mm of leftward midline shift. Electronically Signed   By: Deatra Robinson M.D.   On: 01/30/2023 21:01   ECHOCARDIOGRAM COMPLETE  Result Date: 01/26/2023    ECHOCARDIOGRAM REPORT   Patient Name:    Angelena Form Date of Exam: 01/26/2023 Medical Rec #:  [id number]         Height:       66.0 in Accession #:    [id number]        Weight:       138.9 lb Date of Birth:  02-28-1946         BSA:          1.713 m Patient Age:    34 years  BP:           147/81 mmHg Patient Gender: F                 HR:           106 bpm. Exam Location:  Inpatient Procedure: 2D Echo, Cardiac Doppler, Color Doppler and Intracardiac            Opacification Agent Indications:    Stroke  History:        Patient has prior history of Echocardiogram examinations, most                 recent 01/10/2022. ICH, TBI, seizure, hx of cancer and Stroke;                 Risk Factors:Hypertension and Dyslipidemia.  Sonographer:    Wallie Char Referring Phys: [id number] Marvel Plan  Sonographer Comments: Technically challenging study due to limited acoustic windows. Image acquisition challenging due to respiratory motion and Image acquisition challenging due to breast implants. IMPRESSIONS  1. Left ventricular ejection fraction, by estimation, is 65 to 70%. The left ventricle has normal function. The left ventricle has no regional wall motion abnormalities. Left ventricular diastolic parameters are consistent with Grade I diastolic dysfunction (impaired relaxation). Elevated left ventricular end-diastolic pressure.  2. Right ventricular systolic function is normal. The right ventricular size is normal. There is normal pulmonary artery systolic pressure. The estimated right ventricular systolic pressure is 35.0 mmHg.  3. The mitral valve is normal in structure. No evidence of mitral valve regurgitation. No evidence of mitral stenosis.  4. The aortic valve has an indeterminant number of cusps. There is moderate calcification of the aortic valve. Aortic valve regurgitation is not visualized. Aortic valve sclerosis/calcification is present, without any evidence of aortic stenosis. Aortic  valve area, by VTI measures 1.57 cm. Aortic valve mean  gradient measures 3.0 mmHg. Aortic valve Vmax measures 1.24 m/s.  5. The inferior vena cava is normal in size with greater than 50% respiratory variability, suggesting right atrial pressure of 3 mmHg. Conclusion(s)/Recommendation(s): No intracardiac source of embolism detected on this transthoracic study. Consider a transesophageal echocardiogram to exclude cardiac source of embolism if clinically indicated. FINDINGS  Left Ventricle: Left ventricular ejection fraction, by estimation, is 65 to 70%. The left ventricle has normal function. The left ventricle has no regional wall motion abnormalities. Definity contrast agent was given IV to delineate the left ventricular  endocardial borders. The left ventricular internal cavity size was normal in size. There is no left ventricular hypertrophy. Left ventricular diastolic parameters are consistent with Grade I diastolic dysfunction (impaired relaxation). Elevated left ventricular end-diastolic pressure. Right Ventricle: The right ventricular size is normal. No increase in right ventricular wall thickness. Right ventricular systolic function is normal. There is normal pulmonary artery systolic pressure. The tricuspid regurgitant velocity is 2.83 m/s, and  with an assumed right atrial pressure of 3 mmHg, the estimated right ventricular systolic pressure is 35.0 mmHg. Left Atrium: Left atrial size was normal in size. Right Atrium: Right atrial size was normal in size. Pericardium: There is no evidence of pericardial effusion. Mitral Valve: The mitral valve is normal in structure. No evidence of mitral valve regurgitation. No evidence of mitral valve stenosis. MV peak gradient, 5.8 mmHg. The mean mitral valve gradient is 3.0 mmHg. Tricuspid Valve: The tricuspid valve is normal in structure. Tricuspid valve regurgitation is mild . No evidence of tricuspid stenosis. Aortic Valve: The aortic valve has an indeterminant number  of cusps. There is moderate calcification of the  aortic valve. Aortic valve regurgitation is not visualized. Aortic valve sclerosis/calcification is present, without any evidence of aortic stenosis. Aortic valve mean gradient measures 3.0 mmHg. Aortic valve peak gradient measures 6.2 mmHg. Aortic valve area, by VTI measures 1.57 cm. Pulmonic Valve: The pulmonic valve was normal in structure. Pulmonic valve regurgitation is not visualized. No evidence of pulmonic stenosis. Aorta: The aortic root is normal in size and structure. Venous: The inferior vena cava is normal in size with greater than 50% respiratory variability, suggesting right atrial pressure of 3 mmHg. IAS/Shunts: No atrial level shunt detected by color flow Doppler.  LEFT VENTRICLE PLAX 2D LVIDd:         3.10 cm   Diastology LVIDs:         2.00 cm   LV e' medial:    5.88 cm/s LV PW:         0.80 cm   LV E/e' medial:  11.9 LV IVS:        1.00 cm   LV e' lateral:   4.25 cm/s LVOT diam:     1.40 cm   LV E/e' lateral: 16.5 LV SV:         33 LV SV Index:   19 LVOT Area:     1.54 cm  RIGHT VENTRICLE             IVC RV S prime:     19.00 cm/s  IVC diam: 1.40 cm TAPSE (M-mode): 2.1 cm LEFT ATRIUM           Index LA diam:      2.20 cm 1.28 cm/m LA Vol (A4C): 27.6 ml 16.11 ml/m  AORTIC VALVE AV Area (Vmax):    1.42 cm AV Area (Vmean):   1.42 cm AV Area (VTI):     1.57 cm AV Vmax:           124.00 cm/s AV Vmean:          84.650 cm/s AV VTI:            0.212 m AV Peak Grad:      6.2 mmHg AV Mean Grad:      3.0 mmHg LVOT Vmax:         114.00 cm/s LVOT Vmean:        78.050 cm/s LVOT VTI:          0.217 m LVOT/AV VTI ratio: 1.02  AORTA Ao Root diam: 3.30 cm MITRAL VALVE                TRICUSPID VALVE MV Area (PHT): 4.88 cm     TR Peak grad:   32.0 mmHg MV Area VTI:   2.16 cm     TR Vmax:        283.00 cm/s MV Peak grad:  5.8 mmHg MV Mean grad:  3.0 mmHg     SHUNTS MV Vmax:       1.20 m/s     Systemic VTI:  0.22 m MV Vmean:      74.3 cm/s    Systemic Diam: 1.40 cm MV Decel Time: 156 msec MV E velocity: 70.10  cm/s MV A velocity: 135.00 cm/s MV E/A ratio:  0.52 Armanda Magic MD Electronically signed by Armanda Magic MD Signature Date/Time: 01/26/2023/10:56:51 AM    Final    CT HEAD WO CONTRAST ( )  Result Date: 01/26/2023 CLINICAL DATA:  Follow-up examination for hemorrhagic stroke. EXAM: CT  HEAD WITHOUT CONTRAST TECHNIQUE: Contiguous axial images were obtained from the base of the skull through the vertex without intravenous contrast. RADIATION DOSE REDUCTION: This exam was performed according to the departmental dose-optimization program which includes automated exposure control, adjustment of the mA and/or kV according to patient size and/or use of iterative reconstruction technique. COMPARISON:  Prior study from earlier the same day. FINDINGS: Brain: Previously identified intraparenchymal hemorrhage centered at the right temporal lobe again seen, not significantly changed in size or morphology as compared to previous. Hemorrhage measures approximately 4.3 x 3.5 x 2.6 cm (estimated volume 20 mL). Surrounding edema with trace 3 mm right-to-left shift. No intraventricular extension. No hydrocephalus or trapping. Basilar cisterns remain patent. No other new acute intracranial hemorrhage or large vessel territory infarct. No mass lesion or extra-axial fluid collection. Underlying chronic microvascular ischemic disease noted. Encephalomalacia at the anterior left frontal lobe could be related to prior ski me a or possibly trauma. Vascular: No abnormal hyperdense vessel. Skull: No new finding. Sinuses/Orbits: Right gaze noted. Chronic right maxillary sinusitis with scattered layering secretions within the sphenoid and right maxillary sinuses. No mastoid effusion. Other: None. IMPRESSION: 1. No significant interval change in size and morphology of 4.3 x 3.5 x 2.6 cm (estimated volume 20 mL) intraparenchymal hemorrhage centered at the right temporal lobe. Surrounding edema with trace 3 mm right-to-left shift. No  intraventricular extension. No hydrocephalus or trapping. 2. No other new acute intracranial abnormality. 3. Underlying chronic microvascular ischemic disease. Electronically Signed   By: Rise Mu M.D.   On: 01/26/2023 00:01   CT Head Wo Contrast  Result Date: 01/25/2023 CLINICAL DATA:  Altered mental status. EXAM: CT HEAD WITHOUT CONTRAST TECHNIQUE: Contiguous axial images were obtained from the base of the skull through the vertex without intravenous contrast. RADIATION DOSE REDUCTION: This exam was performed according to the departmental dose-optimization program which includes automated exposure control, adjustment of the mA and/or kV according to patient size and/or use of iterative reconstruction technique. COMPARISON:  CT head dated March 05, 2022. FINDINGS: Brain: New large acute intraparenchymal hemorrhage in the right temporal lobe measuring approximately 5.3 cm in maximum diameter, with local subarachnoid extension. There is surrounding vasogenic edema and sulcal effacement. There is new asymmetry of the right lateral ventricle compared to the left, with potential early entrapment of the temporal horn. No hydrocephalus or midline shift. Basal cisterns are patent. Stable mild atrophy and moderate chronic microvascular ischemic changes. Expected evolution of the previously seen right frontal intraparenchymal hemorrhage a year ago, now with encephalomalacia. No extra-axial collection or mass lesion. Vascular: Atherosclerotic vascular calcification of the carotid siphons. No hyperdense vessel. Skull: Normal. Negative for fracture or focal lesion. Sinuses/Orbits: No acute finding. Other: None. IMPRESSION: 1. New large acute intraparenchymal hemorrhage in the right temporal lobe measuring approximately 5.3 cm in maximum diameter, with local subarachnoid extension. New asymmetry of the right lateral ventricle compared to the left, with potential early entrapment of the temporal horn. No  hydrocephalus or midline shift. 2. Expected evolution of the previously seen right frontal intraparenchymal hemorrhage a year ago, now with encephalomalacia. Critical Value/emergent results were called by telephone at the time of interpretation on 01/25/2023 at 3:59 pm to provider JOSHUA LONG , who verbally acknowledged these results. Electronically Signed   By: Obie Dredge M.D.   On: 01/25/2023 15:59    Microbiology: Results for orders placed or performed during the hospital encounter of 01/25/23  Urine Culture     Status: Abnormal   Collection  Time: 01/25/23  3:09 PM   Specimen: Urine, Clean Catch  Result Value Ref Range Status   Specimen Description URINE, CLEAN CATCH  Final   Special Requests   Final    NONE Performed at [facility], [address], [place], [place] [address]    Culture MULTIPLE SPECIES PRESENT, SUGGEST RECOLLECTION (A)  Final   Report Status 01/27/2023 FINAL  Final  SARS Coronavirus 2 by RT PCR (hospital order, performed in [facility] hospital lab) *cepheid single result test* Anterior Nasal Swab     Status: None   Collection Time: 01/25/23  7:38 PM   Specimen: Anterior Nasal Swab  Result Value Ref Range Status   SARS Coronavirus 2 by RT PCR NEGATIVE NEGATIVE Final    Comment: Performed at [facility], [address] [place], [place] [address]  MRSA Next Gen by PCR, Nasal     Status: None   Collection Time: 01/25/23  7:38 PM   Specimen: Nasal Mucosa; Nasal Swab  Result Value Ref Range Status   MRSA by PCR Next Gen NOT DETECTED NOT DETECTED Final    Comment: (NOTE) The GeneXpert MRSA Assay (FDA approved for NASAL specimens only), is one component of a comprehensive MRSA colonization surveillance program. It is not intended to diagnose MRSA infection nor to guide or monitor treatment for MRSA infections. Test performance is not FDA approved in patients less than 13 years old. Performed at [facility], [address] [place],  [place] [address]   Culture, blood (Routine X 2) w Reflex to ID Panel     Status: None (Preliminary result)   Collection Time: 01/30/23  5:55 PM   Specimen: BLOOD RIGHT HAND  Result Value Ref Range Status   Specimen Description BLOOD RIGHT HAND  Final   Special Requests   Final    BOTTLES DRAWN AEROBIC AND ANAEROBIC Blood Culture adequate volume   Culture   Final    NO GROWTH < 12 HOURS Performed at [facility], [address] [place], [place] [address]    Report Status PENDING  Incomplete  Culture, blood (Routine X 2) w Reflex to ID Panel     Status: None (Preliminary result)   Collection Time: 01/30/23  6:04 PM   Specimen: BLOOD LEFT HAND  Result Value Ref Range Status   Specimen Description BLOOD LEFT HAND  Final   Special Requests   Final    BOTTLES DRAWN AEROBIC AND ANAEROBIC Blood Culture adequate volume   Culture   Final    NO GROWTH < 12 HOURS Performed at [facility], [address], [place], [place] [address]    Report Status PENDING  Incomplete   Labs: CBC: Recent Labs  Lab 01/25/23 1424 01/26/23 0346 01/27/23 1312 01/28/23 0411 01/29/23 0419 01/30/23 0318 01/31/23 0623  WBC 11.5*   < > 8.6 7.7 7.7 9.9 9.3  NEUTROABS 7.6  --   --   --  5.2 6.5 6.3  HGB 11.8*   < > 11.0* 11.6* 11.3* 12.7 11.9*  HCT 35.3*   < > 32.9* 34.1* 33.7* 37.8 35.2*  MCV 90.3   < > 91.1 88.1 91.1 91.1 89.8  PLT 335   < > 285 296 294 311 334   < > = values in this interval not displayed.   Basic Metabolic Panel: Recent Labs  Lab 01/27/23 1312 01/28/23 0411 01/29/23 0419 01/30/23 0318 01/31/23 0623  NA 139 134* 135 134* 137  K 3.7 3.6 3.6 3.9 4.2  CL 100 102 102 99 102  CO2 21* 24 22 23 23   GLUCOSE 100* 107* 104* 104* 111*  BUN 17 15 13 15 23   CREATININE 0.64 0.66 0.55 0.65 0.63  CALCIUM 7.8* 8.7* 8.4* 8.9 9.2  MG  --   --  2.1 2.1 2.0  PHOS  --   --  3.5 4.2 4.8*   Liver Function Tests: Recent Labs  Lab 01/25/23 1424 01/29/23 0419 01/30/23 0318 01/31/23 0623   AST 22 18 16 19   ALT 19 15 18 17   ALKPHOS 77 54 62 62  BILITOT 0.6 0.6 0.5 0.4  PROT 6.4* 5.7* 6.5 6.3*  ALBUMIN 3.4* 3.0* 3.3* 3.3*   CBG: Recent Labs  Lab 01/28/23 0427  GLUCAP 106*   Discharge time spent: greater than 30 minutes.  Signed: Marguerita Merles, DO [facility] 01/31/2023

## 2023-01-31 NOTE — TOC Transition Note (Signed)
Transition of Care [facility]) - CM/SW Discharge Note   Patient Details  Name: Cynthia [name] MRN: [id number] Date of Birth: 1946-02-15  [facility] CM/SW Contact:  Baldemar Lenis, LCSW Phone Number: 01/31/2023, 1:33 PM   Clinical Narrative:   CSW contacted back by [facility] to ask for clarification on patient's baseline. CSW contacted [facility] to ask about baseline, gained information to pass along to [facility]. Patient received approval for SNF. CSW updated MD, and sent discharge summary to [facility]. CSW updated son, Susy Frizzle, via phone. Transport arranged with [facility] for next available.  Nurse to call report to [phone], Room 306a    Final next level of care: Skilled Nursing Facility Barriers to Discharge: Barriers Resolved   Patient Goals and CMS Choice      Discharge Placement                Patient chooses bed at: [facility] Patient to be transferred to facility by: [facility] Name of family member notified: Matt Patient and family notified of of transfer: 01/31/23  Discharge Plan and Services Additional resources added to the After Visit Summary for   In-house Referral: Clinical Social Work   Post Acute Care Choice: Skilled Nursing Facility                               Social Determinants of Health (SDOH) Interventions SDOH Screenings   Food Insecurity: Patient Unable To Answer (01/29/2023)  Housing: Patient Unable To Answer (01/29/2023)  Transportation Needs: No Transportation Needs (03/05/2021)  Alcohol Screen: Low Risk  (03/05/2021)  Depression (PHQ2-9): Low Risk  (03/05/2021)  Financial Resource Strain: Low Risk  (03/05/2021)  Physical Activity: Sufficiently Active (03/05/2021)  Social Connections: Moderately Isolated (03/05/2021)  Stress: No Stress Concern Present (03/05/2021)  Tobacco Use: Low Risk  (01/29/2023)     Readmission Risk Interventions     No data to display

## 2023-01-31 NOTE — Progress Notes (Signed)
ABG result relayed to Dr. Arlean Hopping.

## 2023-01-31 NOTE — Plan of Care (Signed)
  Problem: Clinical Measurements: Goal: Diagnostic test results will improve Outcome: Not Progressing   Problem: Safety: Goal: Ability to remain free from injury will improve Outcome: Not Progressing   

## 2023-01-31 NOTE — Procedures (Signed)
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 EEG to evaluate for seizure.  Level of alertness: Awake, asleep  AEDs during EEG study: LEV  Technical aspects: This EEG study was done with scalp electrodes positioned according to the 10-20 International system of electrode placement. Electrical activity was reviewed with band pass filter of 1-70Hz , sensitivity of 7 uV/mm, display speed of 40mm/sec with a 60Hz  notched filter applied as appropriate. EEG data were recorded continuously and digitally stored.  Video monitoring was available and reviewed as appropriate.  Description: No clear posterior dominant rhythm was seen. Sleep was characterized by sleep spindles (12 to 14 Hz), maximal frontocentral region. EEG showed continuous generalized and lateralized right hemisphere 3 to 6 Hz theta-delta slowing. Hyperventilation and photic stimulation were not performed.     ABNORMALITY - Continuous slow, generalized and lateralized right hemisphere   IMPRESSION: This study is suggestive of cortical dysfunction arising from right hemisphere likely secondary to underlying structural abnormality. Additionally there is moderate diffuse encephalopathy. No seizures or epileptiform discharges were seen throughout the recording.  Keerthi Hazell Annabelle Harman

## 2023-01-31 NOTE — NC FL2 (Signed)
MEDICAID FL2 LEVEL OF CARE FORM     IDENTIFICATION  Patient Name: Cynthia [name] Birthdate: 08-Feb-1946 Sex: female Admission Date (Current Location): 01/25/2023  [facility] and [place] Number:  Producer, television/film/video and Address:  [facility] [place], [address], [place], [place] [address]      Provider Number: [id number]  Attending Physician Name and Address:  Merlene Laughter, DO  Relative Name and Phone Number:       Current Level of Care: Hospital Recommended Level of Care: Skilled Nursing Facility Prior Approval Number:    Date Approved/Denied:   PASRR Number:    Discharge Plan: SNF    Current Diagnoses: Patient Active Problem List   Diagnosis Date Noted   Fall 03/04/2022   Poor appetite    Mood disorder in conditions classified elsewhere    Seizure (HCC) 01/14/2022   Hyperlipidemia 01/14/2022   Hypokalemia 01/14/2022   Ductal carcinoma in situ (DCIS) of left breast 07/23/2020   Partial symptomatic epilepsy with complex partial seizures, not intractable, without status epilepticus (HCC) 06/08/2017   Numbness and tingling 10/03/2016   Cognitive deficit due to recent stroke    Left hemiparesis (HCC) 03/29/2016   Left-sided visual neglect 03/29/2016   Vascular headache    Benign essential HTN    Hyponatremia    Acute blood loss anemia    TBI (traumatic brain injury) (HCC)    Intracerebral hemorrhage 03/20/2016   ICH (intracerebral hemorrhage) (HCC) 03/20/2016   Colon polyps    Osteoporosis    HTN (hypertension) 01/08/2013    Orientation RESPIRATION BLADDER Height & Weight     Self  Normal Incontinent Weight: 152 lb 5.4 oz (69.1 kg) Height:  5\' 6"  (167.6 cm)  BEHAVIORAL SYMPTOMS/MOOD NEUROLOGICAL BOWEL NUTRITION STATUS      Incontinent Diet (see DC summary)  AMBULATORY STATUS COMMUNICATION OF NEEDS Skin   Extensive Assist Verbally Normal                       Personal Care Assistance Level of Assistance   Bathing, Feeding, Dressing Bathing Assistance: Maximum assistance Feeding assistance: Maximum assistance Dressing Assistance: Maximum assistance     Functional Limitations Info             SPECIAL CARE FACTORS FREQUENCY  PT (By licensed PT), OT (By licensed OT)     PT Frequency: 5x/wk OT Frequency: 5x/wk            Contractures Contractures Info: Not present    Additional Factors Info  Code Status, Allergies Code Status Info: Full Allergies Info: Other, Acyclovir And Related, Codeine, Penicillins           Current Medications (01/31/2023):  This is the current hospital active medication list Current Facility-Administered Medications  Medication Dose Route Frequency Provider Last Rate Last Admin   acetaminophen (TYLENOL) tablet 650 mg  650 mg Oral Q4H PRN Marvel Plan, MD   650 mg at 01/26/23 1209   Or   acetaminophen (TYLENOL) 160 MG/5ML solution 650 mg  650 mg Per Tube Q4H PRN Marvel Plan, MD       Or   acetaminophen (TYLENOL) suppository 650 mg  650 mg Rectal Q4H PRN Marvel Plan, MD   650 mg at 01/25/23 1839   amLODipine (NORVASC) tablet 10 mg  10 mg Oral Daily Marvel Plan, MD   10 mg at 01/31/23 1013   famotidine (PEPCID) tablet 20 mg  20 mg Oral BID Hetty Blend  C, NP   20 mg at 01/31/23 1013   heparin injection 5,000 Units  5,000 Units Subcutaneous Buena Irish, MD   5,000 Units at 01/31/23 1610   hydrALAZINE (APRESOLINE) injection 10-20 mg  10-20 mg Intravenous Q4H PRN Gevena Mart A, NP   10 mg at 01/28/23 0420   labetalol (NORMODYNE) injection 10-20 mg  10-20 mg Intravenous Q2H PRN Mathews Argyle, NP       levETIRAcetam (KEPPRA) tablet 750 mg  750 mg Oral BID Hetty Blend C, NP   750 mg at 01/31/23 1013   losartan (COZAAR) tablet 100 mg  100 mg Oral Daily Hetty Blend C, NP   100 mg at 01/31/23 1013   metoprolol tartrate (LOPRESSOR) tablet 50 mg  50 mg Oral BID Marvel Plan, MD   50 mg at 01/31/23 1013   multivitamin with minerals tablet 1 tablet  1 tablet  Oral Daily Marguerita Merles Weston, DO   1 tablet at 01/31/23 1013   Oral care mouth rinse  15 mL Mouth Rinse PRN Marvel Plan, MD       senna-docusate (Senokot-S) tablet 1 tablet  1 tablet Oral BID Marvel Plan, MD   1 tablet at 01/31/23 1013     Discharge Medications: Please see discharge summary for a list of discharge medications.  Relevant Imaging Results:  Relevant Lab Results:   Additional Information SS#: [id number]  Baldemar Lenis, LCSW

## 2023-01-31 NOTE — Progress Notes (Signed)
EEG complete - results pending 

## 2023-02-01 LAB — CULTURE, BLOOD (ROUTINE X 2)

## 2023-02-02 LAB — CULTURE, BLOOD (ROUTINE X 2)

## 2023-02-03 LAB — CULTURE, BLOOD (ROUTINE X 2)
Culture: NO GROWTH
Special Requests: ADEQUATE

## 2023-02-04 LAB — CULTURE, BLOOD (ROUTINE X 2): Culture: NO GROWTH

## 2023-02-13 DIAGNOSIS — I10 Essential (primary) hypertension: Secondary | ICD-10-CM

## 2023-02-13 DIAGNOSIS — Z08 Encounter for follow-up examination after completed treatment for malignant neoplasm: Secondary | ICD-10-CM

## 2023-02-13 DIAGNOSIS — I619 Nontraumatic intracerebral hemorrhage, unspecified: Secondary | ICD-10-CM

## 2023-02-13 DIAGNOSIS — F32A Depression, unspecified: Secondary | ICD-10-CM

## 2023-02-13 DIAGNOSIS — D62 Acute posthemorrhagic anemia: Secondary | ICD-10-CM

## 2023-02-13 DIAGNOSIS — M6281 Muscle weakness (generalized): Secondary | ICD-10-CM

## 2023-03-21 NOTE — Progress Notes (Unsigned)
[facility]  [name]: Cynthia [name] DOB: 01/19/1946   REASON FOR VISIT: History of recurrent strokes, seizure and cognitive impairment HISTORY FROM: [name] and son    HISTORY OF PRESENT ILLNESS:  UPDATE 9/30/2019CM Cynthia [name], 77 year old female returns for follow-up with history of intracranial hemorrhage in July 2017.  She is also had episodes suggestive of simple partial seizures none recently.  She is currently on Keppra 500 mg XR daily.  She is on aspirin for secondary stroke prevention without recurrent stroke or TIA symptoms.  She has no bruising and no bleeding.  Blood pressure well controlled in Cynthia office today at 123/68.  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  She has a lot of anxiety related to an alcoholic husband.  She has Xanax to take as needed.  She exercises maybe 3 times a week.  She has stopped her yoga class was encouraged to get back into that.  She complains of decreased energy level.  She denies any visual difficulty speech or swallowing problems balance issues or falls.  No interval medical issues.  She returns for reevaluation   UPDATE 09/27/2018CM Cynthia [name], 77 year old female returns for follow-up with a history of intracranial hemorrhage in July 2017. She also has a history of episodes suggestive of simple partial seizures and was started on Keppra 500 mg daily. She has not had further episodes.She denies side effects to Cynthia medication. She remains on aspirin for secondary stroke prevention without recurrent stroke or TIA symptoms. She has minimal bruising and no bleeding.Blood pressure in Cynthia office today 129/71. She continues to teach part time. She denies any speech disturbance swallowing problems, visual difficulties, weakness, balance issues or falls. She returns for reevaluation  UPDATE 03/27/2018CM Cynthia [name], 77 year old female returns for follow-up with a history of intracranial hemorrhage July 2017. When last seen she  was having episodes of numbness left side lasting 5-10 minutes. EEG was ordered showing focal slowing on Cynthia right side which is to be expected given her previous intracerebral hemorrhage. She had another episode of transient paresthesias starting in Cynthia left shoulder and spreading gradually down into Cynthia fingertips of Cynthia left hand over   a few minutes. These episodes are suggestive of simple partial seizure. She was started on Keppra after her EEG was performed , she is currently taking Keppra XR 500 mg daily even though Cynthia EEG was negative for seizures. She has had one similar episode since it lasted several minutes shortly after starting Cynthia Keppra. She remains on aspirin secondary stroke prevention. She denies any weakness speech disturbance swallowing problems visual difficulties and falls or balance issues. 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 She has had 2 of these events in Cynthia last 2 months. She had hospital admission in July 2017 for intracranial hemorrhage. Follow-up angiogram in November revealed no AV malformation or aneurysms. Her blood pressure does fluctuate however is well controlled in Cynthia office today at 122/64. She has had episodes where her left arm from her shoulder to her hand became numb and tingling however she denied any weakness with this ,one time her face was involved there again numbness but no weakness. She denies any speech disturbance, swallowing problems visual difficulty, problems with balance or falls or other associated symptoms. She denies headache. Her aspirin 0.81 was restarted by her primary care physician on 09/22/2016. She  works as a Runner, broadcasting/film/video she returns for reevaluation  HISTORY PS 10/9/17[name] is a 65 year Caucasian lady seen today for Cynthia first office follow-up visit following hospital  admission for intracerebral hemorrhage in July 6280. 77 year old right-handed woman who presented to Cynthia [facility] Emergency department after being sent from her doctor's office due to concern for possible stroke.Cynthia [name]  reported that Cynthia night of 03/19/2016 (LKW, time unknown) she developed a sense of feeling disoriented and having difficulty speaking. When asked about her speech, she described that she had trouble focusing her thoughts and getting her words out. She reported that her sister also noticed that she seemed slow to respond to questions. She felt as if her speech was somewhat slurred as well. She did not endorse any weakness, numbness, vision changes, difficulty swallowing, or balance impairments. However, she had noticed that she will find her left arm in unusual postures and positions without being aware that it is doing anything. When she looked at it, she was able to control it without a problem and again denied any weakness or sensory changes. She  also had a headache for Cynthia past 2 days.  She denies any similar previous episodes. She denied any recent trauma to Cynthia head. She did note that she was working with her brother at her home and had been up on a ladder. She was getting off Cynthia ladder, she thought she was on Cynthia ground but instead was on Cynthia second step of Cynthia latter when she stumbled backwards. She says that she struck her bottom on a nearby chair, then fell to Cynthia floor landing on her bottom. She did not strike her head. She has not had any recent illness. She does not use daily antiplatelet therapy or anticoagulation. She uses occasional Advil for headache and pain and has taken a total of six 200 mg tablets since last night. She also reports that she took a baby aspirin earlier today as recommended by her sister because of her symptoms.CT brain on admission showed a large right frontal ICH. [name] was not administered IV t-PA secondary to ICH. She was admitted to Cynthia neuro ICU for  further evaluation and treatment. Her blood pressure was tightly controlled initially with IV drip and subsequently with oral medications. Follow-up CT scan showed stable appearance of Cynthia hemorrhage. There was mild cytotoxic edema but no hydrocephalus or any hemorrhage expansion. CT angiogram and CT venogram did not show any aneurysms or venous sinus thrombosis but there were engorged vessels in Cynthia region of Cynthia hemorrhage raising concern about a small AVM hence cerebral catheter angiogram was performed and findings of which are also indeterminate and showed abnormal right frontopolar branches .She had persistent left-sided weakness but her speech and word finding difficulties improved. She was transferred to Cynthia neurology floor in a condition remained stable. She was seen by physical occupational therapist and felt to be a good candidate for inpatient rehabilitation. She was accepted for transfer to rehabilitation in stable condition. She states she's done well since discharge. She has [name] outpatient physical and occupational therapy. She is back to her baseline and has no complaints or deficits from her intracerebral hemorrhage. She does feel she gets tired easily and at times her left-sided balance and coordination may be off. She states her blood pressure is well controlled and today it is 137/87 in office. She is wondering if she needs follow-up imaging studies. She has no complaints  Update 06/17/2019 : She returns for follow-up after last  visit a year ago with Darrol Angel, nurse practitioner.  She states she is doing well and blood pressure is well controlled.  She had one brief episode of tingling in her left calf a few days ago.  It stayed localized and did not progress up to involve Cynthia rest of Cynthia left leg or Cynthia upper extremity.  She states a few days ago she had somewhat similar minor episode as well.  She does complain of having cramps in her legs and thinks it may be due to dehydration as she  is not drinking enough fluids.  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  She is tolerating Keppra XR 500 mg once daily very well without any side effects.  No other new complaints. Update 10/22/2020: She returns for follow-up after last visit in October 2020.  She is doing well she has had no episodes of paresthesias since starting Keppra in February 2018.  She has been diagnosed with intraductal breast cancer and underwent lumpectomy in December last year and is currently finishing her course of radiation.  She complains of feeling tired and fatigued wonders if this is from her stroke.  She is tolerating Keppra well without any side effects.  Her blood pressures well controlled and today it is 138/80.  She has no other new new neurological complaints.  She is wondering if she can stop Keppra and if she needs it since she has been episode free for 4 years  Update 02/22/2021 : She returns for follow-up after last visit 4 months ago.  She has reduced Cynthia Keppra initially to 250 twice daily and now has been taking it once a day for Cynthia last 6 weeks and has not not had any episodes of numbness or seizure-like episodes.  She has finished her lumpectomy for breast cancer as well as 4 weeks of radiation therapy and an upcoming appointment to see oncologist Dr. Darnelle Catalan in a few weeks.  She had EEG done on 11/17/2020 which was normal.  She has no complaints except her left foot little toe is swollen and red since this morning. Update 06/16/2021 : She returns for follow-up after last visit 4 months ago.  She is now been off Keppra for several months.  She has had no recurrent numbness or seizure-like episodes.  She states she had 5-6 tick bites earlier this year in June and did not realize it.  She started having  low-grade fever, excessive sleeping and tiredness.  She was eventually diagnosed with Lyme's disease after Cynthia blood test was done in August by her primary physician.  She was also having cognitive issues.  She is was tremulous and also off balance.  She has now finished a course of antibiotics doxycycline and started feeling better.  Blood pressure is usually better at home though today it is elevated in office at 157/86.  She has no other new neurological complaints. Update 05/18/2022 : She returns for follow-up after last visit 9 months ago.  She is accompanied by her son.  She was hospitalized in May 2023 with a recurrent right frontal intracerebral hemorrhage.  She was admitted on 01/10/2022 acute onset left-sided weakness she slid out of bed.  Code stroke was called.  Cynthia head showed acute right frontal parasagittal intracerebral  hemorrhage.  NIH stroke scale was 3.  She had predominant left lower extremity weakness.  MRI scan showed stable right frontoparietal epidural hematoma with mild cytotoxic edema with no intraventricular extension and midline shift.  Old hemorrhagic right frontal infarct was noted as well.  Carotid ultrasound was unremarkable.  2D echo showed normal ejection fraction without cardiac source of embolism.  LDL cholesterol 94 mg percent.  Hemoglobin A1c is 5.7.  Possible seizure activity.  Spot EEG showed evidence of right hemispheric cortical dysfunction and overnight EEG with video showed seizures average 2 to 4 hours lasting 1 to 2 minutes arising from Cynthia right frontotemporal region.  [name] was started on Keppra for seizure prophylaxis.  He was transferred to [facility] skilled nursing facility for rehab.  Cynthia [name]'s son has noted that there has been cognitive decline following Cynthia second hemorrhage though they have noticed some mild improvement after Cynthia first hemorrhage also.  Unable to get up and walk by herself but needs help.  She is working with physical therapy and is able to  take a few steps using a walker but somebody takes very close to her.  She has slowed down a lot and requiring a lot of help with most activities of daily living.  She is tolerating Keppra well and has not had any recurrent seizures.  CT angiogram of Cynthia brain on 03/10/2021 and previously July 2017 were both unremarkable without any vascular abnormalities, AVMs or aneurysms  Up-to-date 09/08/2022: She returns for follow-up after last visit 3 months ago.  She is accompanied by her son.  She is presently living at [facility] skilled nursing facility.  She gets 24-hour care.  She continues to have spastic left hemiparesis.  She is mostly in a wheelchair and is not walking.  She is not getting any physical Occupational Therapy any longer due to lack of improvement.  She was started on Namenda at last visit and seems to be tolerating it well.  [name] and son did not report significant improvement however on cognitive testing today she scored 20/30 which is a lot better than 10/30 at last visit.  She denies any delusions, hallucinations, agitation or violent behavior.  She has not had recurrent stroke or TIA symptoms.  She remains on Keppra and is tolerating it well and has not had any breakthrough seizures either.   Update 03/21/2023 JM: Returns for follow-up visit after prior visits with Dr. Pearlean Brownie.  She is accompanied by her son who provides history.  [facility]ues to [facility] at [facility].  Unfortunately she was hospitalized back in May with a right temporal ICH likely in setting of CAA.  Cognition has declined since that time as well as worsening left hemiparesis.  She is completely wheelchair-bound and transfers via [facility] lift.  She has not had any additional seizures and remains on Keppra.  Also continues on Namenda without side effects.  Denies any behavioral concerns.  MMSE today 10/30 (prior 20/30).  She has not had any new or worsening stroke/TIA symptoms since that time.       REVIEW OF SYSTEMS: Full  14 system review of systems performed and notable only for those listed in HPI, all others are neg:  ALLERGIES: Allergies  Allergen Reactions   Other Anaphylaxis    FIRE ANTS Insect stings / bites   Acyclovir And Related Other (See Comments)    Unknown reaction   Codeine Other (See Comments)    Syncope    Penicillins Rash    Has [name]  had a PCN reaction causing immediate rash, facial/tongue/throat swelling, SOB or lightheadedness with hypotension: Unknown Has [name] had a PCN reaction causing severe rash involving mucus membranes or skin necrosis: No Has [name] had a PCN reaction that required hospitalization No Has [name] had a PCN reaction occurring within Cynthia last 10 years: No If all of Cynthia above answers are "NO", then may proceed with Cephalosporin use.     HOME MEDICATIONS: Outpatient Medications Prior to Visit  Medication Sig Dispense Refill   acetaminophen (TYLENOL) 500 MG tablet Take 2 tablets (1,000 mg total) by mouth every 6 (six) hours as needed for mild pain, fever or headache.     amLODipine (NORVASC) 10 MG tablet Take 1 tablet (10 mg total) by mouth daily. 30 tablet 0   ATIVAN 0.5 MG tablet Take 0.5 mg by mouth every 4 (four) hours as needed.     cetirizine (ZYRTEC) 5 MG chewable tablet Chew 5 mg by mouth daily.     famotidine (PEPCID) 20 MG tablet Take 1 tablet (20 mg total) by mouth 2 (two) times daily. 180 tablet 2   ferrous sulfate 325 (65 FE) MG tablet Take 325 mg by mouth daily.     fluticasone (FLONASE) 50 MCG/ACT nasal spray Place 1 spray into both nostrils daily.     LAGEVRIO 200 MG CAPS capsule Take 4 capsules by mouth 2 (two) times daily.     levETIRAcetam (KEPPRA) 750 MG tablet Take 750 mg by mouth 2 (two) times daily.     losartan (COZAAR) 100 MG tablet TAKE 1 TABLET BY MOUTH DAILY FOR BLOOD PRESSURE 90 tablet 2   memantine (NAMENDA) 10 MG tablet Take 1 tablet (10 mg total) by mouth 2 (two) times daily. 60 tablet 3   metoprolol tartrate (LOPRESSOR)  50 MG tablet Take 1 tablet (50 mg total) by mouth 2 (two) times daily. 60 tablet 0   Multiple Vitamin (MULTIVITAMIN WITH MINERALS) TABS tablet Take 1 tablet by mouth daily. 30 tablet    senna-docusate (SENOKOT-S) 8.6-50 MG tablet Take 1 tablet by mouth 2 (two) times daily. 30 tablet 0   venlafaxine XR (EFFEXOR-XR) 150 MG 24 hr capsule Take 150 mg by mouth daily with breakfast.     No facility-administered medications prior to visit.    PAST MEDICAL HISTORY: Past Medical History:  Diagnosis Date   Breast cancer (HCC)    Colon polyps    Depression    history of   Diverticulitis    GERD (gastroesophageal reflux disease)    Hypertension    Osteoporosis    Seizures (HCC)    Stroke (HCC)     PAST SURGICAL HISTORY: Past Surgical History:  Procedure Laterality Date   BREAST LUMPECTOMY WITH RADIOACTIVE SEED LOCALIZATION Left 08/28/2020   Procedure: LEFT BREAST LUMPECTOMY WITH RADIOACTIVE SEED LOCALIZATION;  Surgeon: Almond Lint, MD;  Location: [facility] OR;  Service: General;  Laterality: Left;  RNFA   IR GENERIC HISTORICAL  08/05/2016   IR ANGIO VERTEBRAL SEL VERTEBRAL UNI R MOD SED 08/05/2016 Julieanne Cotton, MD [facility]   IR GENERIC HISTORICAL  08/05/2016   IR ANGIO INTRA EXTRACRAN SEL COM CAROTID INNOMINATE BILAT MOD SED 08/05/2016 Julieanne Cotton, MD [facility]   IR GENERIC HISTORICAL  08/05/2016   IR ANGIO VERTEBRAL SEL SUBCLAVIAN INNOMINATE UNI L MOD SED 08/05/2016 Julieanne Cotton, MD [facility]   MOUTH SURGERY     TONSILLECTOMY     TUBAL LIGATION      FAMILY HISTORY: Family History  Problem  Relation Age of Onset   Heart disease Mother    Cancer Mother        lung   Stroke Paternal Grandmother    Stomach cancer Maternal Grandfather     SOCIAL HISTORY: Social History   Socioeconomic History   Marital status: Widowed    Spouse name: Not on file   Number of children: 3   Years of education: Not on file   Highest education level: Not on file   Occupational History    Comment: teacher, retired  Tobacco Use   Smoking status: Never    Passive exposure: Never   Smokeless tobacco: Never  Vaping Use   Vaping Use: Never used  Substance and Sexual Activity   Alcohol use: Yes    Alcohol/week: 1.0 standard drink of alcohol    Types: 1 Glasses of wine per week    Comment: 1-2 wine in evening   Drug use: Not Currently   Sexual activity: Not Currently    Comment: Married since 2005  Other Topics Concern   Not on file  Social History Narrative   Lives with son   Right Handed   Drinks 1-2 cups daily.    Social Determinants of Health   Financial Resource Strain: Low Risk  (03/05/2021)   Overall Financial Resource Strain (CARDIA)    Difficulty of Paying Living Expenses: Not very hard  Food Insecurity: [name] Unable To Answer (01/29/2023)   Hunger Vital Sign    Worried About Running Out of Food in Cynthia Last Year: [name] unable to answer    Ran Out of Food in Cynthia Last Year: [name] unable to answer  Transportation Needs: No Transportation Needs (03/05/2021)   PRAPARE - Administrator, Civil Service (Medical): No    Lack of Transportation (Non-Medical): No  Physical Activity: Sufficiently Active (03/05/2021)   Exercise Vital Sign    Days of Exercise per Week: 7 days    Minutes of Exercise per Session: 30 min  Stress: No Stress Concern Present (03/05/2021)   [employer] of Occupational Health - Occupational Stress Questionnaire    Feeling of Stress : Not at all  Social Connections: Moderately Isolated (03/05/2021)   Social Connection and Isolation Panel [NHANES]    Frequency of Communication with Friends and Family: More than three times a week    Frequency of Social Gatherings with Friends and Family: More than three times a week    Attends Religious Services: Never    Database administrator or Organizations: No    Attends Banker Meetings: Never    Marital Status: Married  Catering manager  Violence: Not At Risk (03/05/2021)   Humiliation, Afraid, Rape, and Kick questionnaire    Fear of Current or Ex-Partner: No    Emotionally Abused: No    Physically Abused: No    Sexually Abused: No     PHYSICAL EXAM  Vitals:   03/22/23 1036  BP: 103/64  Pulse: 73   There is no height or weight on file to calculate BMI.  Generalized: Pleasant frail elderly Caucasian lady in no acute distress  Head: normocephalic and atraumatic,.  Neck: Supple, no carotid bruits  Cardiac: Regular rate rhythm, no murmur  Skin no rash or petechiae Musculoskeletal: No deformity   Neurological examination  Mentation: Disoriented to time and place, oriented to person. Attention span and concentration diminished. Recent and remote memory poor.  Follows simple commands on right side, intermittently follows commands on left side.  Suspect  left-sided neglect.  Language hesitant and nonfluent.  Cranial nerve II-XII: Pupils were equal round reactive to light.  Right gaze preference with difficulty crossing midline.  Left hemianopia vs visual neglect.  Left lower facial weakness. Motor: full strength in right upper and lower extremity.  Difficulty fully testing left-sided strength due to questionable neglect vs difficulty following commands.  LUE proximal 2/5, limited hand movement with difficulty with extension; LLE limited movement, did withdraw to pain Sensory: Sensation symmetrical bilaterally subjectively but suspect diminished on left Coordination: right finger-nose-finger intact, difficulty performing heel-to-shin bilaterally Reflexes: 3+ left upper and lower extremity and 2+ on Cynthia right.   Gait and Station: Deferred as nonambulatory       03/22/2023   10:45 AM 09/08/2022    1:20 PM 05/18/2022    9:54 AM  MMSE - Mini Mental State Exam  Orientation to time 0 1 1  Orientation to Place 3 5 2   Registration 3 3 3   Attention/ Calculation 0 2 0  Recall 2 3 0  Language- name 2 objects 1 2 1   Language-  repeat 1 1 1   Language- follow 3 step command 0 1 2  Language- read & follow direction 0 1 0  Write a sentence 0 1 0  Copy design 0 0 0  Total score 10 20 10         DIAGNOSTIC DATA (LABS, IMAGING, TESTING) - I reviewed [name] records, labs, notes, testing and imaging myself where available.  Lab Results  Component Value Date   WBC 9.3 01/31/2023   HGB 11.9 (L) 01/31/2023   HCT 35.2 (L) 01/31/2023   MCV 89.8 01/31/2023   PLT 334 01/31/2023      Component Value Date/Time   NA 137 01/31/2023 0623   K 4.2 01/31/2023 0623   CL 102 01/31/2023 0623   CO2 23 01/31/2023 0623   GLUCOSE 111 (H) 01/31/2023 0623   BUN 23 01/31/2023 0623   CREATININE 0.63 01/31/2023 0623   CREATININE 0.96 (H) 03/19/2021 1657   CALCIUM 9.2 01/31/2023 0623   PROT 6.3 (L) 01/31/2023 0623   ALBUMIN 3.3 (L) 01/31/2023 0623   AST 19 01/31/2023 0623   AST 20 07/29/2020 1234   ALT 17 01/31/2023 0623   ALT 14 07/29/2020 1234   ALKPHOS 62 01/31/2023 0623   BILITOT 0.4 01/31/2023 0623   BILITOT 0.5 07/29/2020 1234   GFRNONAA >60 01/31/2023 0623   GFRNONAA 59 (L) 03/19/2021 1657   GFRAA 68 03/19/2021 1657   Lab Results  Component Value Date   CHOL 232 (H) 01/26/2023   HDL 90 01/26/2023   LDLCALC 128 (H) 01/26/2023   TRIG 72 01/26/2023   CHOLHDL 2.6 01/26/2023   Lab Results  Component Value Date   HGBA1C 5.3 01/26/2023      ASSESSMENT AND PLAN 3 year Caucasian lady with history of multiple strokes including right temporal ICH 01/2023, and recurrent parenchymal right frontal intracerebral hemorrhage in 03/2016  and 01/2022 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  She also had partial seizures which seem well controlled on Keppra.     1.  Vascular dementia 2.  Right frontal intracerebral hemorrhage 3.  Right temporal ICH -Decline in overall functioning and cognition since recent stroke in May.  Not currently working with therapy due to meeting max  benefit -MMSE today 10/30 (prior 20/30) -Continue Namenda 10 mg twice daily -Continue close follow-up with PCP for aggressive stroke risk factor management   3.  Seizures -  Denies any recent seizure activity -Continue Keppra 750 mg twice daily -Advised to call with any seizure activity    Follow-up in 6 months or call earlier if needed   I spent 46 minutes of face-to-face and non-face-to-face time with [name] and son.  This included previsit chart review, lab review, study review, order entry, electronic health record documentation, [name] education and discussion regarding above diagnoses and treatment plan and answered all other questions to [name] and son's satisfaction  Ihor Austin, AGNP-BC  [facility] [address] [place], [place] [address]  Phone [phone] Fax [phone] Note: This document was prepared with digital dictation and possible smart phrase technology. Any transcriptional errors that result from this process are unintentional.

## 2023-03-22 ENCOUNTER — Ambulatory Visit: Payer: Medicare PPO | Admitting: Adult Health

## 2023-03-22 ENCOUNTER — Encounter: Payer: Self-pay | Admitting: Adult Health

## 2023-03-22 VITALS — BP 103/64 | HR 73

## 2023-03-22 DIAGNOSIS — I619 Nontraumatic intracerebral hemorrhage, unspecified: Secondary | ICD-10-CM | POA: Diagnosis not present

## 2023-03-22 DIAGNOSIS — I680 Cerebral amyloid angiopathy: Secondary | ICD-10-CM

## 2023-03-22 DIAGNOSIS — F01C0 Vascular dementia, severe, without behavioral disturbance, psychotic disturbance, mood disturbance, and anxiety: Secondary | ICD-10-CM | POA: Diagnosis not present

## 2023-03-22 DIAGNOSIS — E854 Organ-limited amyloidosis: Secondary | ICD-10-CM

## 2023-03-22 DIAGNOSIS — G40209 Localization-related (focal) (partial) symptomatic epilepsy and epileptic syndromes with complex partial seizures, not intractable, without status epilepticus: Secondary | ICD-10-CM

## 2023-03-22 NOTE — Patient Instructions (Signed)
Continue keppra 750mg  twice daily for seizure prevention  Continue Namenda 10mg  twice daily for memory  Maintain strict control of hypertension with blood pressure goal below 130/90, diabetes with hemoglobin A1c goal below 7.0 % and cholesterol with LDL cholesterol (bad cholesterol) goal below 70 mg/dL.   Signs of a Stroke? Follow the BEFAST method:  Balance Watch for a sudden loss of balance, trouble with coordination or vertigo Eyes Is there a sudden loss of vision in one or both eyes? Or double vision?  Face: Ask the person to smile. Does one side of the face droop or is it numb?  Arms: Ask the person to raise both arms. Does one arm drift downward? Is there weakness or numbness of a leg? Speech: Ask the person to repeat a simple phrase. Does the speech sound slurred/strange? Is the person confused ? Time: If you observe any of these signs, call 911.     Followup in the future with me in 6 months or call earlier if needed      Thank you for coming to see [place] at [facility]. I hope we have been able to provide you high quality care today.  You may receive a patient satisfaction survey over the next few weeks. We would appreciate your feedback and comments so that we may continue to improve ourselves and the health of our patients.

## 2023-06-15 DIAGNOSIS — G40909 Epilepsy, unspecified, not intractable, without status epilepticus: Secondary | ICD-10-CM

## 2023-06-15 DIAGNOSIS — I10 Essential (primary) hypertension: Secondary | ICD-10-CM | POA: Diagnosis not present

## 2023-06-15 DIAGNOSIS — F320 Major depressive disorder, single episode, mild: Secondary | ICD-10-CM | POA: Diagnosis not present

## 2023-06-15 DIAGNOSIS — F0150 Vascular dementia without behavioral disturbance: Secondary | ICD-10-CM | POA: Diagnosis not present

## 2023-06-15 DIAGNOSIS — I629 Nontraumatic intracranial hemorrhage, unspecified: Secondary | ICD-10-CM | POA: Diagnosis not present

## 2023-09-27 NOTE — Progress Notes (Deleted)
 [facility]  PATIENT: Cynthia [name] DOB: 1945-12-04   REASON FOR VISIT: History of recurrent strokes, seizure and cognitive impairment HISTORY FROM: Patient and son    Follow-up visit:  Prior visit: 03/22/2023  Brief HPI:  Cynthia [name] is a 78 y.o. 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  Also has partial seizures well-controlled on Keppra.  Prior attempt in stopping Keppra in 2022 as seizure-free for 4 years but had a recurrent seizure in 01/2022 with recurrent IPH and Keppra restarted.  She is a long-term resident at [facility].  At prior visit, reported decline of cognition since ICH in 01/2023, MMSE 10/30 (prior 20/30), remained on Namenda.  No seizures on Keppra 750 mg twice daily.   Interval history:      Update 03/21/2023 JM: Returns for follow-up visit after prior visits with Dr. Pearlean Brownie.  She is accompanied by her son who provides history.  Continues to reside at [facility].  Unfortunately she was hospitalized back in May with a right temporal ICH likely in setting of CAA.  Cognition has declined since that time as well as worsening left hemiparesis.  She is completely wheelchair-bound and transfers via [facility] lift.  She has not had any additional seizures and remains on Keppra.  Also continues on Namenda without side effects.  Denies any behavioral concerns.  MMSE today 10/30 (prior 20/30).  She has not had any new or worsening stroke/TIA symptoms since that time.     REVIEW OF SYSTEMS: Full 14 system review of systems performed and notable only for those listed in HPI, all others are neg:   ALLERGIES: Allergies  Allergen Reactions   Other Anaphylaxis    FIRE ANTS Insect stings / bites   Acyclovir And Related Other (See Comments)    Unknown reaction   Codeine Other (See  Comments)    Syncope    Penicillins Rash    Has patient had a PCN reaction causing immediate rash, facial/tongue/throat swelling, SOB or lightheadedness with hypotension: Unknown Has patient had a PCN reaction causing severe rash involving mucus membranes or skin necrosis: No Has patient had a PCN reaction that required hospitalization No Has patient had a PCN reaction occurring within the last 10 years: No If all of the above answers are "NO", then may proceed with Cephalosporin use.     HOME MEDICATIONS: Outpatient Medications Prior to Visit  Medication Sig Dispense Refill   acetaminophen (TYLENOL) 500 MG tablet Take 2 tablets (1,000 mg total) by mouth every 6 (six) hours as needed for mild pain, fever or headache.     amLODipine (NORVASC) 10 MG tablet Take 1 tablet (10 mg total) by mouth daily. 30 tablet 0   ATIVAN 0.5 MG tablet Take 0.5 mg by mouth every 4 (four) hours as needed.     cetirizine (ZYRTEC) 5 MG chewable tablet Chew 5 mg by mouth daily.     famotidine (PEPCID) 20 MG tablet Take 1 tablet (20 mg total) by mouth 2 (two) times daily. 180 tablet 2   ferrous sulfate 325 (65 FE) MG tablet Take 325 mg by mouth daily.     fluticasone (FLONASE) 50 MCG/ACT nasal spray Place 1 spray into both nostrils daily.     LAGEVRIO 200 MG CAPS capsule Take 4 capsules by mouth 2 (two) times daily.     levETIRAcetam (KEPPRA) 750 MG  tablet Take 750 mg by mouth 2 (two) times daily.     losartan (COZAAR) 100 MG tablet TAKE 1 TABLET BY MOUTH DAILY FOR BLOOD PRESSURE 90 tablet 2   memantine (NAMENDA) 10 MG tablet Take 1 tablet (10 mg total) by mouth 2 (two) times daily. 60 tablet 3   metoprolol tartrate (LOPRESSOR) 50 MG tablet Take 1 tablet (50 mg total) by mouth 2 (two) times daily. 60 tablet 0   Multiple Vitamin (MULTIVITAMIN WITH MINERALS) TABS tablet Take 1 tablet by mouth daily. 30 tablet    senna-docusate (SENOKOT-S) 8.6-50 MG tablet Take 1 tablet by mouth 2 (two) times daily. 30 tablet 0    venlafaxine XR (EFFEXOR-XR) 150 MG 24 hr capsule Take 150 mg by mouth daily with breakfast.     No facility-administered medications prior to visit.    PAST MEDICAL HISTORY: Past Medical History:  Diagnosis Date   Breast cancer (HCC)    Colon polyps    Depression    history of   Diverticulitis    GERD (gastroesophageal reflux disease)    Hypertension    Osteoporosis    Seizures (HCC)    Stroke (HCC)     PAST SURGICAL HISTORY: Past Surgical History:  Procedure Laterality Date   BREAST LUMPECTOMY WITH RADIOACTIVE SEED LOCALIZATION Left 08/28/2020   Procedure: LEFT BREAST LUMPECTOMY WITH RADIOACTIVE SEED LOCALIZATION;  Surgeon: Almond Lint, MD;  Location: [facility] OR;  Service: General;  Laterality: Left;  RNFA   IR GENERIC HISTORICAL  08/05/2016   IR ANGIO VERTEBRAL SEL VERTEBRAL UNI R MOD SED 08/05/2016 Julieanne Cotton, MD [facility]   IR GENERIC HISTORICAL  08/05/2016   IR ANGIO INTRA EXTRACRAN SEL COM CAROTID INNOMINATE BILAT MOD SED 08/05/2016 Julieanne Cotton, MD [facility]   IR GENERIC HISTORICAL  08/05/2016   IR ANGIO VERTEBRAL SEL SUBCLAVIAN INNOMINATE UNI L MOD SED 08/05/2016 Julieanne Cotton, MD [facility]   MOUTH SURGERY     TONSILLECTOMY     TUBAL LIGATION      FAMILY HISTORY: Family History  Problem Relation Age of Onset   Heart disease Mother    Cancer Mother        lung   Stroke Paternal Grandmother    Stomach cancer Maternal Grandfather     SOCIAL HISTORY: Social History   Socioeconomic History   Marital status: Widowed    Spouse name: Not on file   Number of children: 3   Years of education: Not on file   Highest education level: Not on file  Occupational History    Comment: teacher, retired  Tobacco Use   Smoking status: Never    Passive exposure: Never   Smokeless tobacco: Never  Vaping Use   Vaping status: Never Used  Substance and Sexual Activity   Alcohol use: Yes    Alcohol/week: 1.0 standard drink of alcohol    Types: 1  Glasses of wine per week    Comment: 1-2 wine in evening   Drug use: Not Currently   Sexual activity: Not Currently    Comment: Married since 2005  Other Topics Concern   Not on file  Social History Narrative   Lives with son   Right Handed   Drinks 1-2 cups daily.    Social Drivers of Health   Financial Resource Strain: Low Risk  (03/05/2021)   Overall Financial Resource Strain (CARDIA)    Difficulty of Paying Living Expenses: Not very hard  Food Insecurity: Patient Unable To Answer (01/29/2023)  Hunger Vital Sign    Worried About Running Out of Food in the Last Year: Patient unable to answer    Ran Out of Food in the Last Year: Patient unable to answer  Transportation Needs: No Transportation Needs (03/05/2021)   PRAPARE - Administrator, Civil Service (Medical): No    Lack of Transportation (Non-Medical): No  Physical Activity: Sufficiently Active (03/05/2021)   Exercise Vital Sign    Days of Exercise per Week: 7 days    Minutes of Exercise per Session: 30 min  Stress: No Stress Concern Present (03/05/2021)   [employer] of Occupational Health - Occupational Stress Questionnaire    Feeling of Stress : Not at all  Social Connections: Moderately Isolated (03/05/2021)   Social Connection and Isolation Panel [NHANES]    Frequency of Communication with Friends and Family: More than three times a week    Frequency of Social Gatherings with Friends and Family: More than three times a week    Attends Religious Services: Never    Database administrator or Organizations: No    Attends Banker Meetings: Never    Marital Status: Married  Catering manager Violence: Not At Risk (03/05/2021)   Humiliation, Afraid, Rape, and Kick questionnaire    Fear of Current or Ex-Partner: No    Emotionally Abused: No    Physically Abused: No    Sexually Abused: No     PHYSICAL EXAM  There were no vitals filed for this visit.  There is no height or weight on file to  calculate BMI.  Generalized: Pleasant frail elderly Caucasian lady in no acute distress  Head: normocephalic and atraumatic,.  Neck: Supple, no carotid bruits  Cardiac: Regular rate rhythm, no murmur  Skin no rash or petechiae Musculoskeletal: No deformity   Neurological examination  Mentation: Disoriented to time and place, oriented to person. Attention span and concentration diminished. Recent and remote memory poor.  Follows simple commands on right side, intermittently follows commands on left side.  Suspect left-sided neglect.  Language hesitant and nonfluent.  Cranial nerve II-XII: Pupils were equal round reactive to light.  Right gaze preference with difficulty crossing midline.  Left hemianopia vs visual neglect.  Left lower facial weakness. Motor: full strength in right upper and lower extremity.  Difficulty fully testing left-sided strength due to questionable neglect vs difficulty following commands.  LUE proximal 2/5, limited hand movement with difficulty with extension; LLE limited movement, did withdraw to pain Sensory: Sensation symmetrical bilaterally subjectively but suspect diminished on left Coordination: right finger-nose-finger intact, difficulty performing heel-to-shin bilaterally Reflexes: 3+ left upper and lower extremity and 2+ on the right.   Gait and Station: Deferred as nonambulatory       03/22/2023   10:45 AM 09/08/2022    1:20 PM 05/18/2022    9:54 AM  MMSE - Mini Mental State Exam  Orientation to time 0 1 1  Orientation to Place 3 5 2   Registration 3 3 3   Attention/ Calculation 0 2 0  Recall 2 3 0  Language- name 2 objects 1 2 1   Language- repeat 1 1 1   Language- follow 3 step command 0 1 2  Language- read & follow direction 0 1 0  Write a sentence 0 1 0  Copy design 0 0 0  Total score 10 20 10         DIAGNOSTIC DATA (LABS, IMAGING, TESTING) - I reviewed patient records, labs, notes, testing and imaging myself where  available.  Lab Results   Component Value Date   WBC 9.3 01/31/2023   HGB 11.9 (L) 01/31/2023   HCT 35.2 (L) 01/31/2023   MCV 89.8 01/31/2023   PLT 334 01/31/2023      Component Value Date/Time   NA 137 01/31/2023 0623   K 4.2 01/31/2023 0623   CL 102 01/31/2023 0623   CO2 23 01/31/2023 0623   GLUCOSE 111 (H) 01/31/2023 0623   BUN 23 01/31/2023 0623   CREATININE 0.63 01/31/2023 0623   CREATININE 0.96 (H) 03/19/2021 1657   CALCIUM 9.2 01/31/2023 0623   PROT 6.3 (L) 01/31/2023 0623   ALBUMIN 3.3 (L) 01/31/2023 0623   AST 19 01/31/2023 0623   AST 20 07/29/2020 1234   ALT 17 01/31/2023 0623   ALT 14 07/29/2020 1234   ALKPHOS 62 01/31/2023 0623   BILITOT 0.4 01/31/2023 0623   BILITOT 0.5 07/29/2020 1234   GFRNONAA >60 01/31/2023 0623   GFRNONAA 59 (L) 03/19/2021 1657   GFRAA 68 03/19/2021 1657   Lab Results  Component Value Date   CHOL 232 (H) 01/26/2023   HDL 90 01/26/2023   LDLCALC 128 (H) 01/26/2023   TRIG 72 01/26/2023   CHOLHDL 2.6 01/26/2023   Lab Results  Component Value Date   HGBA1C 5.3 01/26/2023      ASSESSMENT AND PLAN 83 year Caucasian lady with history of multiple strokes including right temporal ICH 01/2023, and recurrent parenchymal right frontal intracerebral hemorrhage in 03/2016  and 01/2022 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  She also had partial seizures which seem well controlled on Keppra.     1.  Vascular dementia 2.  Right frontal intracerebral hemorrhage 3.  Right temporal ICH -Decline in overall functioning and cognition since recent stroke in May.  Not currently working with therapy due to meeting max benefit -MMSE today 10/30 (prior 20/30) -Continue Namenda 10 mg twice daily -Continue close follow-up with PCP for aggressive stroke risk factor management   3.  Seizures -Denies any recent seizure activity -Continue Keppra 750 mg twice daily -Advised to call with any seizure activity    Follow-up in 6 months or  call earlier if needed   I spent 46 minutes of face-to-face and non-face-to-face time with patient and son.  This included previsit chart review, lab review, study review, order entry, electronic health record documentation, patient education and discussion regarding above diagnoses and treatment plan and answered all other questions to patient and son's satisfaction  Ihor Austin, AGNP-BC  [facility] [address] [place], [place] [address]  Phone [phone] Fax [phone] Note: This document was prepared with digital dictation and possible smart phrase technology. Any transcriptional errors that result from this process are unintentional.

## 2023-09-28 ENCOUNTER — Encounter: Payer: Self-pay | Admitting: Adult Health

## 2023-09-28 ENCOUNTER — Ambulatory Visit: Payer: Medicare PPO | Admitting: Adult Health

## 2024-01-22 DIAGNOSIS — R52 Pain, unspecified: Secondary | ICD-10-CM | POA: Diagnosis not present

## 2024-01-22 DIAGNOSIS — K219 Gastro-esophageal reflux disease without esophagitis: Secondary | ICD-10-CM | POA: Diagnosis not present

## 2024-01-22 DIAGNOSIS — I10 Essential (primary) hypertension: Secondary | ICD-10-CM | POA: Diagnosis not present

## 2024-03-18 DIAGNOSIS — G3184 Mild cognitive impairment, so stated: Secondary | ICD-10-CM | POA: Diagnosis not present

## 2024-03-18 DIAGNOSIS — I10 Essential (primary) hypertension: Secondary | ICD-10-CM | POA: Diagnosis not present

## 2024-03-18 DIAGNOSIS — F320 Major depressive disorder, single episode, mild: Secondary | ICD-10-CM | POA: Diagnosis not present

## 2024-03-18 DIAGNOSIS — G40909 Epilepsy, unspecified, not intractable, without status epilepticus: Secondary | ICD-10-CM | POA: Diagnosis not present

## 2024-05-10 DIAGNOSIS — K219 Gastro-esophageal reflux disease without esophagitis: Secondary | ICD-10-CM | POA: Diagnosis not present

## 2024-05-10 DIAGNOSIS — I10 Essential (primary) hypertension: Secondary | ICD-10-CM | POA: Diagnosis not present

## 2024-05-10 DIAGNOSIS — R413 Other amnesia: Secondary | ICD-10-CM | POA: Diagnosis not present

## 2024-06-23 DIAGNOSIS — K219 Gastro-esophageal reflux disease without esophagitis: Secondary | ICD-10-CM | POA: Diagnosis not present

## 2024-06-23 DIAGNOSIS — F339 Major depressive disorder, recurrent, unspecified: Secondary | ICD-10-CM | POA: Diagnosis not present

## 2024-06-23 DIAGNOSIS — R413 Other amnesia: Secondary | ICD-10-CM | POA: Diagnosis not present

## 2024-06-23 DIAGNOSIS — I10 Essential (primary) hypertension: Secondary | ICD-10-CM | POA: Diagnosis not present

## 2024-07-18 DIAGNOSIS — I10 Essential (primary) hypertension: Secondary | ICD-10-CM | POA: Diagnosis not present

## 2024-07-18 DIAGNOSIS — F339 Major depressive disorder, recurrent, unspecified: Secondary | ICD-10-CM | POA: Diagnosis not present

## 2024-07-18 DIAGNOSIS — R413 Other amnesia: Secondary | ICD-10-CM | POA: Diagnosis not present

## 2024-07-18 DIAGNOSIS — K219 Gastro-esophageal reflux disease without esophagitis: Secondary | ICD-10-CM | POA: Diagnosis not present

## 2024-07-23 DIAGNOSIS — G40909 Epilepsy, unspecified, not intractable, without status epilepticus: Secondary | ICD-10-CM | POA: Diagnosis not present

## 2024-07-23 DIAGNOSIS — I10 Essential (primary) hypertension: Secondary | ICD-10-CM | POA: Diagnosis not present

## 2024-07-23 DIAGNOSIS — F320 Major depressive disorder, single episode, mild: Secondary | ICD-10-CM | POA: Diagnosis not present
